# Patient Record
Sex: Male | Born: 1960 | Race: White | Hispanic: No | Marital: Married | Smoking: Current every day smoker
Health system: Southern US, Community
[De-identification: ages and names within clinical notes are randomized; demographics above are authoritative.]

## PROBLEM LIST (undated history)

## (undated) DIAGNOSIS — M4800 Spinal stenosis, site unspecified: Secondary | ICD-10-CM

## (undated) DIAGNOSIS — G629 Polyneuropathy, unspecified: Secondary | ICD-10-CM

## (undated) DIAGNOSIS — Z6841 Body Mass Index (BMI) 40.0 and over, adult: Secondary | ICD-10-CM

## (undated) DIAGNOSIS — E669 Obesity, unspecified: Secondary | ICD-10-CM

## (undated) DIAGNOSIS — J449 Chronic obstructive pulmonary disease, unspecified: Secondary | ICD-10-CM

## (undated) DIAGNOSIS — G4730 Sleep apnea, unspecified: Secondary | ICD-10-CM

## (undated) DIAGNOSIS — K859 Acute pancreatitis without necrosis or infection, unspecified: Secondary | ICD-10-CM

## (undated) DIAGNOSIS — E1169 Type 2 diabetes mellitus with other specified complication: Secondary | ICD-10-CM

## (undated) DIAGNOSIS — E1129 Type 2 diabetes mellitus with other diabetic kidney complication: Secondary | ICD-10-CM

## (undated) DIAGNOSIS — I10 Essential (primary) hypertension: Secondary | ICD-10-CM

## (undated) DIAGNOSIS — I219 Acute myocardial infarction, unspecified: Secondary | ICD-10-CM

## (undated) DIAGNOSIS — I509 Heart failure, unspecified: Secondary | ICD-10-CM

## (undated) DIAGNOSIS — E7800 Pure hypercholesterolemia, unspecified: Secondary | ICD-10-CM

## (undated) DIAGNOSIS — J189 Pneumonia, unspecified organism: Secondary | ICD-10-CM

## (undated) HISTORY — PX: AMPUTATION: SHX166

## (undated) HISTORY — DX: Heart failure, unspecified: I50.9

## (undated) HISTORY — PX: HERNIA REPAIR: SHX51

## (undated) HISTORY — DX: Spinal stenosis, site unspecified: M48.00

---

## 1996-02-23 HISTORY — PX: CHOLECYSTECTOMY: SHX55

## 2004-07-02 ENCOUNTER — Emergency Department: Payer: Self-pay | Admitting: Emergency Medicine

## 2005-12-30 ENCOUNTER — Emergency Department: Payer: Self-pay | Admitting: Emergency Medicine

## 2006-10-31 ENCOUNTER — Emergency Department: Payer: Self-pay | Admitting: Emergency Medicine

## 2007-02-27 ENCOUNTER — Emergency Department: Payer: Self-pay | Admitting: Emergency Medicine

## 2007-08-18 ENCOUNTER — Emergency Department: Payer: Self-pay | Admitting: Emergency Medicine

## 2012-08-23 ENCOUNTER — Inpatient Hospital Stay: Payer: Self-pay | Admitting: Internal Medicine

## 2012-08-23 LAB — CBC
HGB: 12.8 g/dL — ABNORMAL LOW (ref 13.0–18.0)
MCV: 89 fL (ref 80–100)
Platelet: 321 10*3/uL (ref 150–440)
RBC: 4.21 10*6/uL — ABNORMAL LOW (ref 4.40–5.90)
RDW: 13.5 % (ref 11.5–14.5)

## 2012-08-23 LAB — BASIC METABOLIC PANEL
Anion Gap: 9 (ref 7–16)
Co2: 26 mmol/L (ref 21–32)
Creatinine: 1.15 mg/dL (ref 0.60–1.30)
EGFR (African American): >60
Glucose: 176 mg/dL — ABNORMAL HIGH (ref 65–99)
Osmolality: 280 (ref 275–301)
Potassium: 4 mmol/L (ref 3.5–5.1)
Sodium: 137 mmol/L (ref 136–145)

## 2012-08-24 DIAGNOSIS — Z01810 Encounter for preprocedural cardiovascular examination: Secondary | ICD-10-CM

## 2012-08-24 LAB — URINALYSIS, COMPLETE
Bacteria: NONE SEEN
Bilirubin,UR: NEGATIVE
Ketone: NEGATIVE
Leukocyte Esterase: NEGATIVE
Ph: 5 (ref 4.5–8.0)
RBC,UR: 1 /HPF (ref 0–5)
Specific Gravity: 1.02 (ref 1.003–1.030)
Squamous Epithelial: <1

## 2012-08-24 LAB — BASIC METABOLIC PANEL
Anion Gap: 6 — ABNORMAL LOW (ref 7–16)
Co2: 27 mmol/L (ref 21–32)
EGFR (African American): >60
EGFR (Non-African Amer.): >60
Osmolality: 282 (ref 275–301)
Potassium: 4 mmol/L (ref 3.5–5.1)

## 2012-08-24 LAB — CBC WITH DIFFERENTIAL/PLATELET
Basophil #: 0.1 10*3/uL (ref 0.0–0.1)
Basophil %: 0.9 %
HCT: 35.4 % — ABNORMAL LOW (ref 40.0–52.0)
HGB: 12.3 g/dL — ABNORMAL LOW (ref 13.0–18.0)
Lymphocyte #: 3.1 10*3/uL (ref 1.0–3.6)
MCHC: 34.6 g/dL (ref 32.0–36.0)
Monocyte %: 7.5 %
Neutrophil #: 8.6 10*3/uL — ABNORMAL HIGH (ref 1.4–6.5)
Neutrophil %: 65.1 %
Platelet: 270 10*3/uL (ref 150–440)
RBC: 3.97 10*6/uL — ABNORMAL LOW (ref 4.40–5.90)
RDW: 13.4 % (ref 11.5–14.5)

## 2012-08-24 LAB — LIPID PANEL
HDL Cholesterol: 33 mg/dL — ABNORMAL LOW (ref 40–60)
Ldl Cholesterol, Calc: 49 mg/dL (ref 0–100)
Triglycerides: 251 mg/dL — ABNORMAL HIGH (ref 0–200)
VLDL Cholesterol, Calc: 50 mg/dL — ABNORMAL HIGH (ref 5–40)

## 2012-08-24 LAB — HEMOGLOBIN A1C: Hemoglobin A1C: 8.8 % — ABNORMAL HIGH (ref 4.2–6.3)

## 2012-10-27 ENCOUNTER — Encounter: Payer: Self-pay | Admitting: Surgery

## 2012-10-31 LAB — WOUND AEROBIC CULTURE

## 2012-11-03 ENCOUNTER — Inpatient Hospital Stay: Payer: Self-pay | Admitting: Internal Medicine

## 2012-11-03 LAB — CBC WITH DIFFERENTIAL/PLATELET
Basophil #: 0.2 10*3/uL — ABNORMAL HIGH (ref 0.0–0.1)
Eosinophil #: 0.1 10*3/uL (ref 0.0–0.7)
Eosinophil %: 0.3 %
HCT: 33.2 % — ABNORMAL LOW (ref 40.0–52.0)
Lymphocyte #: 1.9 10*3/uL (ref 1.0–3.6)
Lymphocyte %: 9.7 %
MCH: 29.4 pg (ref 26.0–34.0)
MCV: 86 fL (ref 80–100)
Monocyte #: 1.9 x10 3/mm — ABNORMAL HIGH (ref 0.2–1.0)
Monocyte %: 9.6 %
Platelet: 302 10*3/uL (ref 150–440)
RBC: 3.89 10*6/uL — ABNORMAL LOW (ref 4.40–5.90)
RDW: 13.6 % (ref 11.5–14.5)

## 2012-11-03 LAB — COMPREHENSIVE METABOLIC PANEL
Alkaline Phosphatase: 115 U/L (ref 50–136)
Calcium, Total: 9.2 mg/dL (ref 8.5–10.1)
Chloride: 98 mmol/L (ref 98–107)
Co2: 25 mmol/L (ref 21–32)
Creatinine: 1.2 mg/dL (ref 0.60–1.30)
EGFR (African American): >60
EGFR (Non-African Amer.): >60
Glucose: 180 mg/dL — ABNORMAL HIGH (ref 65–99)
Osmolality: 271 (ref 275–301)
Potassium: 4.1 mmol/L (ref 3.5–5.1)
Sodium: 130 mmol/L — ABNORMAL LOW (ref 136–145)
Total Protein: 8.1 g/dL (ref 6.4–8.2)

## 2012-11-05 LAB — BASIC METABOLIC PANEL
BUN: 21 mg/dL — ABNORMAL HIGH (ref 7–18)
Calcium, Total: 8.3 mg/dL — ABNORMAL LOW (ref 8.5–10.1)
Chloride: 105 mmol/L (ref 98–107)
EGFR (African American): >60
EGFR (Non-African Amer.): >60
Osmolality: 277 (ref 275–301)
Potassium: 3.7 mmol/L (ref 3.5–5.1)

## 2012-11-05 LAB — CBC WITH DIFFERENTIAL/PLATELET
Basophil #: 0.1 10*3/uL (ref 0.0–0.1)
Eosinophil #: 0.2 10*3/uL (ref 0.0–0.7)
Eosinophil %: 1.3 %
HCT: 31.3 % — ABNORMAL LOW (ref 40.0–52.0)
HGB: 10.7 g/dL — ABNORMAL LOW (ref 13.0–18.0)
Lymphocyte %: 15.1 %
MCH: 29.4 pg (ref 26.0–34.0)
MCHC: 34.2 g/dL (ref 32.0–36.0)
MCV: 86 fL (ref 80–100)
Monocyte %: 8.6 %
Neutrophil %: 74.3 %
RDW: 13.5 % (ref 11.5–14.5)
WBC: 14.3 10*3/uL — ABNORMAL HIGH (ref 3.8–10.6)

## 2012-11-05 LAB — VANCOMYCIN, TROUGH: Vancomycin, Trough: 12 ug/mL (ref 10–20)

## 2012-11-06 LAB — CBC WITH DIFFERENTIAL/PLATELET
Eosinophil #: 0.4 10*3/uL (ref 0.0–0.7)
Eosinophil %: 2.1 %
HGB: 11.4 g/dL — ABNORMAL LOW (ref 13.0–18.0)
Lymphocyte %: 16.5 %
MCH: 29.7 pg (ref 26.0–34.0)
MCHC: 34.6 g/dL (ref 32.0–36.0)
MCV: 86 fL (ref 80–100)
Monocyte #: 1 x10 3/mm (ref 0.2–1.0)
Neutrophil #: 12.7 10*3/uL — ABNORMAL HIGH (ref 1.4–6.5)
Platelet: 310 10*3/uL (ref 150–440)
RDW: 13.6 % (ref 11.5–14.5)

## 2012-11-07 LAB — CBC WITH DIFFERENTIAL/PLATELET
Basophil #: 0.1 x10 3/mm 3
Basophil %: 0.8 %
Eosinophil #: 0.4 x10 3/mm 3
Eosinophil %: 2.7 %
HCT: 32.7 % — ABNORMAL LOW
HGB: 11.3 g/dL — ABNORMAL LOW
Lymphocyte %: 19.4 %
Lymphs Abs: 2.8 x10 3/mm 3
MCH: 29.5 pg
MCHC: 34.4 g/dL
MCV: 86 fL
Monocyte #: 1 "x10 3/mm "
Monocyte %: 7.1 %
Neutrophil #: 10 x10 3/mm 3 — ABNORMAL HIGH
Neutrophil %: 70 %
Platelet: 298 x10 3/mm 3
RBC: 3.82 x10 6/mm 3 — ABNORMAL LOW
RDW: 13.8 %
WBC: 14.3 x10 3/mm 3 — ABNORMAL HIGH

## 2012-11-08 LAB — CBC WITH DIFFERENTIAL/PLATELET
Basophil %: 0.7 %
Eosinophil #: 0.6 10*3/uL (ref 0.0–0.7)
Eosinophil %: 3.4 %
HCT: 34.2 % — ABNORMAL LOW (ref 40.0–52.0)
HGB: 11.6 g/dL — ABNORMAL LOW (ref 13.0–18.0)
Lymphocyte #: 3.7 10*3/uL — ABNORMAL HIGH (ref 1.0–3.6)
Lymphocyte %: 22.6 %
MCH: 29.3 pg (ref 26.0–34.0)
MCHC: 34 g/dL (ref 32.0–36.0)
Monocyte #: 1 x10 3/mm (ref 0.2–1.0)
Monocyte %: 6.3 %
Neutrophil %: 67 %
WBC: 16.3 10*3/uL — ABNORMAL HIGH (ref 3.8–10.6)

## 2012-11-08 LAB — CULTURE, BLOOD (SINGLE)

## 2012-11-09 LAB — WOUND CULTURE

## 2012-11-22 ENCOUNTER — Encounter: Payer: Self-pay | Admitting: Surgery

## 2012-12-11 ENCOUNTER — Emergency Department: Payer: Self-pay | Admitting: Emergency Medicine

## 2013-01-17 DIAGNOSIS — E785 Hyperlipidemia, unspecified: Secondary | ICD-10-CM | POA: Insufficient documentation

## 2013-01-17 DIAGNOSIS — F17200 Nicotine dependence, unspecified, uncomplicated: Secondary | ICD-10-CM | POA: Diagnosis present

## 2013-01-17 DIAGNOSIS — E119 Type 2 diabetes mellitus without complications: Secondary | ICD-10-CM

## 2013-01-17 DIAGNOSIS — S98139A Complete traumatic amputation of one unspecified lesser toe, initial encounter: Secondary | ICD-10-CM | POA: Insufficient documentation

## 2013-01-17 DIAGNOSIS — I10 Essential (primary) hypertension: Secondary | ICD-10-CM | POA: Diagnosis present

## 2013-01-17 DIAGNOSIS — F329 Major depressive disorder, single episode, unspecified: Secondary | ICD-10-CM | POA: Insufficient documentation

## 2014-04-11 ENCOUNTER — Inpatient Hospital Stay: Payer: Self-pay | Admitting: Internal Medicine

## 2014-04-22 ENCOUNTER — Other Ambulatory Visit: Payer: Self-pay | Admitting: Urgent Care

## 2014-04-23 ENCOUNTER — Emergency Department: Payer: Self-pay | Admitting: Internal Medicine

## 2014-06-07 ENCOUNTER — Ambulatory Visit
Admit: 2014-06-07 | Disposition: A | Discharge status: Home / Self Care | Payer: Self-pay | Attending: Gastroenterology | Admitting: Gastroenterology

## 2014-06-14 NOTE — H&P (Signed)
PATIENT NAME:  Brian [name], Brian [name] MR#:  [id number] DATE OF BIRTH:  Apr 30, 1960  DATE OF ADMISSION:  08/23/2012  REFERRING PHYSICIAN: Lavonia Drafts, MD  PRIMARY CARE PHYSICIAN: Nonlocal.  The patient goes to [facility] for his care.   CHIEF COMPLAINT: Ulcer of the left great toe.   HISTORY OF PRESENT ILLNESS: [name] is a very nice 54 year old gentleman who has history of diabetes which has been well controlled. The highest that his hemoglobin A1c has been is around 9.  He was told that he was be going to be put on insulin, and he started doing some diet to get it down to 6, but now it increased again almost close to 8.0. His blood sugars have been running in the 170s to 200s. The patient has a history of severe neuropathy, and he states that he likes to wear comfortable shoes without socks. The patient apparently has been educated about diabetes neuropathy, but he still does things like that.  He started having a little blister a week ago that became bigger and bigger and bigger up until today.  He started getting significant inflammation with signs of erosion and is starting to have a bad odor. Now, he developed significant erythema at the level of the toe going down to mid foot. The patient had an elevation of white blood cells. The patient states that he has not had any fever but occasionally has been feeling a little bit cold. He does not feel very energetic and decided to come here to the Emergency Department to get evaluated. At the moment of the evaluation, the ulcer looks a little bit infected. He had a cellulitic process as well of the great toe for which he is admitted for treatment of diabetic foot ulcer. His x-ray of the foot has not been read  by radiologist, but I can see some beginning subperiosteal reaction and maybe some bone destruction. We are going to wait for confirmation by the radiologist.   REVIEW OF SYSTEMS: Negative except for as mentioned in history of present illness.   CONSTITUTIONAL: Denies any fever, denies any significant weakness, weight loss or weight gain. No blurry vision, double vision, no cataracts. Extremities: No tinnitus, ear pain or sinus pain.  RESPIRATORY: No cough, no respiratory distress. No COPD.  CARDIOVASCULAR: No chest pain, orthopnea, edema or palpitations.  GASTROINTESTINAL: No nausea, vomiting, abdominal pain, or hematemesis.  GENITOURINARY: No dysuria, hematuria or changes in frequency.  ENDOCRINE: No polyuria, polydipsia, polyphagia, cold or heat intolerance. His diabetes is uncontrolled.   HEMATOLOGIC AND LYMPHATIC: No anemia, easy bruising, bleeding or swollen glands.  SKIN: No rashes, petechiae or lesions.  MUSCULOSKELETAL: No significant pain on the back, shoulder. No gout. No significant neurologic changes other than the neuropathy. Decreased sensation at the level of the distal lower extremities. No tremor, vertigo, CVAs or TIAs.  PSYCHIATRIC: No significant insomnia, depression, anxiety.   PAST MEDICAL HISTORY: 1.  Peripheral neuropathy.  2.  Type 2 diabetes, non-insulin-dependent, uncontrolled.  3.  Chronic back pain.  4.  Hypertension.  5.  Depression.   ALLERGIES: THE PATIENT STATES HE IS ALLERGIC TO NAPROXEN.   PAST SURGICAL HISTORY: Cholecystectomy.   FAMILY HISTORY: Coronary artery disease. His dad died at the age of 53 from a massive heart attack. The patient denies any cancer in the history, positive diabetes and hypertension in his father.   SOCIAL HISTORY: The patient smokes 1 pack of cigarettes a day.  Smoking cessation counseling was  given the patient  for about 4 minutes. The patient states that he has never consider quitting smoking. I explained the situation of peripheral vascular disease, coronary artery disease, amputations, lung cancer and other cancers associated to tobacco.  The patient says that he will consider quitting smoking. Denies any alcohol or drugs. He is married. He has been off work since  2007 after he got laid off and he applied to Disability.   MEDICATIONS: Zoloft 100 mg once a day, Tramadol 50 mg take 2 twice daily, metformin 1000 mg twice daily, gabapentin 300 mg take 2 capsules once a day, cetirizine 10 mg once a day, Accupril 5 mg once a day.   PHYSICAL EXAMINATION: Blood pressure 148/79, pulse 86, up to 113, temperature 97.7, respirations 20, oxygen saturation 97% on room air.  GENERAL: The patient is alert, oriented x 3. No acute distress. No respiratory distress. Hemodynamically stable.  HEENT: Pupils are equal and reactive. Extraocular movements are intact. Mucosa are moist. Anicteric sclerae. Pink conjunctivae. No oral lesions. No oropharyngeal exudates.  NECK: Obese. No rigidity. No masses. Trachea central. No carotid bruits.  CARDIOVASCULAR: Regular rate and rhythm. No murmurs, rubs or gallops are appreciated. No displacement of PMI. No tenderness to palpation of anterior chest wall.  LUNGS: Clear without any wheezing or crepitus. No use of accessory muscles.  ABDOMEN: Soft, nontender, nondistended. No hepatosplenomegaly. No masses. Bowel sounds are positive.  GENITAL: Deferred.  EXTREMITIES: Mild edema +1 at the level of the ankles. No cyanosis, no clubbing. Positive ulcer over the left toe. Lower pole distal is wet.  It has a bad odor, has erythema surrounding the ulcer, and there is no crepitation. The patient has no sensation on palpation of the ulcer.  The ulcer has eschar on top, and his great toe is red,  erythematous. Erythema progressed around the foot all the way to mid foot on the dorsum area.  VASCULAR: Pulses +2. Capillary refill less than 3 seconds.  NEUROLOGIC: Significant decreased sensation at the level of the foot bilaterally. Cranial nerves II through XII are intact. Strength is 5 out of 5 in all 4 extremities. Deep tendon reflexes +2. The patient's affect is normal.  Alert and oriented x 3.  LYMPHATIC: Negative for lymphadenopathy in the neck,  supraclavicular areas or groin.  SKIN: Without any rashes or petechiae other than the one described at the level of the foot.   LABORATORY AND RADIOLOGICAL DATA:  Glucose 176, BUN 19, creatinine 1.15, sodium 137, potassium 4.0. White blood cells 15.9, hemoglobin 12, platelets 321. Urinalysis is pending, ordered just now.  EKG is pending, ordered just now.  X-ray of the foot as described above in the HPI.   ASSESSMENT AND PLAN: A 54 year old gentleman with history of uncontrolled diabetes, neuropathy, back pain, hypertension and depression, comes today with an ulcer that has been going on for over a week.   1.  Diabetic foot ulcer:  The patient has an infected ulcer with a cellulitic process down to mid foot. There is no gas production. There is no crepitation. There are no signs of abscess. The patient has a hemoglobin A1c in the past around eight, uncontrolled diabetes, severe neuropathy.  For this, we are going to admit the patient and treat him.  Podiatry consult has been placed. The patient is started on broad-spectrum antibiotics for diabetic foot ulcer to cover things like pseudomonas with Levaquin 750 mg IV daily. Consider adding on Zosyn if there is significant concern for an aerobic infection. At this moment, I  do not think there is. Diabetes education given.  Neuropathy education given.  2.  Increased white blood cells:  Likely due to the infectious process. Follow-up labs.  3.  Hypertension:  The patient has well controlled hypertension on lisinopril.  Continue same management.  4.  Type 2 diabetes, uncontrolled, non-insulin-dependent for now:  The patient has been offered to start on insulin in the past. He has refused.  He started with a hemoglobin A1c above 9, dropped it down 6 with only diet. The patient actually got his hemoglobin A1c several weeks ago back to 8. At this moment, we are going to continue metformin and add on insulin sliding scale.  Diet recommendations given.  5.   Tobacco abuse:  The patient has a history of 1 pack a day. Smoking cessation education has been given over 4 minutes.  6.  The patient is at high risk of peripheral vascular disease. His pulses are okay, but consider vascular consultation if ulcer not healing after therapy. Due to possible infection of the bone with possible periosteal reaction, we are  going to add an ESR and a CRP.  If doubts, consider adding an MRI, but hopefully this could be determined once the ulcer is debrided.  7.  Deep vein thrombosis prophylaxis with heparin.  Gastrointestinal prophylaxis with Protonix.   TIME SPENT: I spent about 45 minutes with this patient.    ____________________________ Bouse Sink, MD rsg:cb D: 08/23/2012 21:47:55 ET T: 08/23/2012 22:46:48 ET JOB#: [id number]  cc: [place] Sink, MD, <Dictator> Janeshia Ciliberto America Brown MD ELECTRONICALLY SIGNED 09/02/2012 16:36

## 2014-06-14 NOTE — Discharge Summary (Signed)
PATIENT NAME:  Brian [name], BRAMBLETT MR#:  [id number] DATE OF BIRTH:  August 05, 1960  DATE OF ADMISSION:  11/03/2012 DATE OF DISCHARGE:  11/08/2012  PRIMARY CARE PHYSICIAN:  None local.   CONSULTATIONS:  1.  Podiatry, Dr. Cleda Mccreedy.  2.  ID physician,  Dr. Ola Spurr.    DISCHARGE DIAGNOSES: 1. Systemic inflammatory response syndrome to her left great toe diabetes foot ulcer and osteomyelitis.   2.  Dehydration.  3.  Hypertension.  4.  Diabetes.  5.  Severe peripheral neuropathy.   PROCEDURE:  Left great toe amputation.   CONDITION: Stable.   CODE STATUS: Full code.   HOME MEDICATIONS:  1.  Metformin 1000 mg p.o. b.i.d. 2.   Zocor 20 mg p.o. 2 tablets once a day.  3.  Sertraline 100 mg 1 tablet once a day.  4.  Lisinopril 5 mg p.o. once a day.  5.  Gabapentin 300 mg p.o. 2 tabs.  6.  Percocet 325 mg/5 mg p.o. tablets q. 6 hours p.r.n. for five days.  7.  Augmentin 875 mg b.i.d. for 2 to 4 weeks, now we gave 14 days.  8.  Cipro 500 mg p.o. 1 tab q. 12 hours for 14 days.   HOME HEALTH:  The patient then needs home health with wound care for the left foot.   DIET: Low sodium, low fat, low cholesterol, ADA diet.   ACTIVITY: As tolerated.   FOLLOW-UP CARE: Follow with PCP within 1 to 2 weeks. Follow up with Dr. Cleda Mccreedy in podiatry one week and follow up with Dr. Ola Spurr within 2 to 4 weeks.   REASON FOR ADMISSION: And nonhealing left great toe ulcer and fever.   HOSPITAL COURSE: The patient is a 54 year old Caucasian male with a history of nonhealing left great toe ulcer, peripheral neuropathy, diabetes, hypertension, who presented ED with nonhealing left great toe ulcer and fever.  In the ED, the patient had a fever of 102.4,  tachycardia at 119 with WBC of 18,000. He was admitted for SIRS secondary to a nonhealing left great toe ulcer. He received vancomycin and cefazolin in ED. For detailed history and physical examination, please refer to the admission note dictated by Dr. [name].  Laboratory data on admission date showed glucose 180, BUN 29, creatinine 1.2, sodium 130, potassium 4.1, chloride 98, bicarbonate 25. The patient had WBC at 19.8, platelets 392. Wound culture showed a heavy growth of staph aureus, MSSA on September 5.    1.  For SIRS secondary to left great toe diabetic foot ulcer, after admission Dr. Cleda Mccreedy evaluated the patient and got an x-ray which showed osteomyelitis. Dr. Cleda Mccreedy suggested amputation of left great toe. The patient underwent this procedure. After the procedure, the patient has been under wound care. In addition after admission, the patient has been treated with vancomycin and cefazolin since the wound culture showed MSSA sensitive to penicillin. Vancomycin was discontinued. Dr. Ola Spurr did a consult and suggested the patient needed a long-term antibiotics; Augmentin a 875 mg for 2 to 4 weeks. In addition, he suggested to Cipro 500 mg p.o. b.i.d.  2.  For diabetes, the patient has been treated with metformin sliding-scale and Lantus. Blood sugar has been controlled.  3.  Hypertension has been treated with lisinopril.  4.  For severe peripheral neuropathy. The patient has been treated with gabapentin.  5.  The patient also has dehydration and hyponatremia, which was treated with normal saline IV and has improved. The patient still has leukocytosis, up and down.  The patient's WBC today is 16.3. I discussed the patient's condition and plan of treatment with Dr. Ola Spurr, case manager and nurse. The patient will be discharged to home today with home health care and wound care.   TIME SPENT: About 137 minutes   ____________________________ Demetrios Loll, MD qc:cc D: 11/08/2012 15:03:33 ET T: 11/08/2012 20:11:12 ET JOB#: [id number]  cc: Demetrios Loll, MD, <Dictator> Demetrios Loll MD ELECTRONICALLY SIGNED 11/09/2012 15:29

## 2014-06-14 NOTE — Consult Note (Signed)
PATIENT NAME:  Brian [name], MARSH MR#:  [id number] DATE OF BIRTH:  10/17/1960  DATE OF CONSULTATION:  11/05/2012  REFERRING PHYSICIAN:  Dr. Bridgett Larsson. CONSULTING PHYSICIAN:  Cheral Marker. Ola Spurr, MD  PRIMARY CARE DOCTOR:  Family nurse practitioner at [facility] in [place], [place].   PODIATRY CONSULTANT: Dr. Vickki Muff.   HISTORY OF PRESENT ILLNESS: This is a 54 year old gentleman with peripheral neuropathy due to type 2 diabetes as well as hypertension and depression. He was admitted September 12th to the hospitalist service after being told at the [facility] that he would need amputation of his toe. Apparently at that time the patient had a fever of 102.   The patient reports that he was admitted in July with a nonhealing ulcer on his left great toe. He was treated during that admission with some local bedside  debridement as well as antibiotics. He is unsure of what antibiotics he has been on in the past but recently he has been on levofloxacin which he says was started about 1-1/2 weeks ago at the [facility]. He was also dced on levo in July.  He had progression of the toe and it became partially gangrenous. At the time of admission, he was tachycardic, febrile, had a white count to 18,000. He was treated initially with vancomycin and cefazolin.   On July 13th, the patient underwent partial first ray amputation of his left foot. Cultures are pending. We are asked to consult for further antibiotic management.   PAST MEDICAL HISTORY 1.  Peripheral neuropathy due to type 2 diabetes.  2.  Type 2 diabetes, unknown control.  3.  Chronic back pain.  4.  Hypertension.  5.  Depression.   PAST SURGICAL HISTORY: Toe amputation as above, cholecystectomy.    ALLERGIES: THE PATIENT IS ALLERGIC TO NAPROSYN.   MEDICATIONS: Apparently, the patient was on levofloxacin 500 mg as an outpatient. Currently, he is on vancomycin and cefzolin Other medications include atorvastatin, gabapentin,  lisinopril, metformin, tramadol and Zoloft.   FAMILY HISTORY: Positive for coronary artery disease. His father died at 61 from a massive heart attack.   SOCIAL HISTORY: The patient continues to smoke 1 pack per day. He does not drink alcohol. He is married and they have 1 grown child. He has been unemployed.   REVIEW OF SYSTEMS: Eleven systems reviewed and negative except as per HPI.    PHYSICAL EXAMINATION VITAL SIGNS: T-max on admission was 102.3, currently he is afebrile. Blood pressure is 124/66, pulse 98, pulse ox 94% on room air.  GENERAL: He is obese, laying in bed in no acute distress.  HEENT: Pupils equal, round, reactive to light and accommodation. Extraocular movements are intact. Sclerae are anicteric. Oropharynx clear.  NECK: Supple.  HEART: Regular.  LUNGS: Clear.  ABDOMEN: Soft, nontender, nondistended. No hepatosplenomegaly.  EXTREMITIES: Left foot is wrapped in postoperative bandages. There is no spreading cellulitis above it.    DIAGNOSTIC DATA: Blood cultures x 2 are negative from admission. Wound culture is pending. White blood count on admission was 19.8 (now down to 14)  platelets 302, hemoglobin 11.4. Renal function was normal at 1.2. X-ray of the foot showed probable findings of osteomyelitis from the left great toe.  Prior cx from July grew MSSA, Strep viridans and a GPR  IMPRESSION AND PLAN: This is a 54 year old with diabetes, a nonhealing diabetic foot ulcer in his great toe. He is now status post amputation.  Cultures are pending.   RECOMMENDATIONS 1.  At this time  I would continue current antibiotics. May need to cover pseudomonas as well but since is clincally responding to current coverage it amy be adequate. 2.  He would likely benefit from oral course of antibiotics following this amputation depending on how the wound edges look after debridement.  3.  I will follow with you and help guide treatment based on cultures.   Thank you for the consult. I would  be glad to follow with you.   ____________________________ Cheral Marker. Ola Spurr, MD dpf:cs D: 11/05/2012 19:02:14 ET T: 11/05/2012 19:46:06 ET JOB#: [id number]  cc: Cheral Marker. Ola Spurr, MD, <Dictator> Jossue Rubenstein Ola Spurr MD ELECTRONICALLY SIGNED 11/05/2012 21:50

## 2014-06-14 NOTE — Consult Note (Signed)
full note dictated.with diabetic ulcer left great toe.erythema.with no purulence or drainage..ulcer.not probe.negative for osteomyleitis.daily dressings.monitor outpt.keep pressure off of foot.recommend wedge shoe to off-load foot.with me in outpt clinic in 1 week.   Electronic Signatures: Samara Deist (MD)  (Signed on 03-Jul-14 13:41)  Authored  Last Updated: 03-Jul-14 13:41 by Samara Deist (MD)

## 2014-06-14 NOTE — Consult Note (Signed)
PATIENT NAME:  Brian [name], Brian [name] MR#:  [id number] DATE OF BIRTH:  [birth year]  DATE OF CONSULTATION:  11/04/2012  CONSULTING PHYSICIAN:  Sharlotte Alamo, DPM  REASON FOR CONSULTATION: [name] is a 54 year old diabetic male with a history of ulceration on his left great toe. He was previously admitted back in July and seen by Dr. Vickki Muff who performed a bedside debridement. He was followed outpatient but then decided to be followed over at the [facility] by Dr. Thom Chimes. Recently he started having increased redness and swelling in the left foot as well as some fevers and presented back to the Emergency Department. The patient was admitted with an elevated white count of almost 20,000 and consulted for amputation of the great toe.   PAST MEDICAL HISTORY: 1.  Non-insulin-requiring diabetes with associated peripheral neuropathy.  2.  Chronic back pain.  3.  Hypertension.  4.  Depression.   PAST SURGICAL HISTORY: 1.  Cholecystectomy.  2.  Bedside debridement left great toe.   HOME MEDICATIONS: 1.  Levaquin 500 mg daily.  2.  Atorvastatin 40 mg daily.  3.  Gabapentin 600 mg 3 times daily.  4.  Lisinopril 5 mg daily.  5.  Metformin 1000 mg twice daily.  6.  Tramadol p.r.n. pain.  7.  Zoloft 100 mg daily.   ALLERGIES: NAPROXEN.   FAMILY HISTORY: Significant for heart disease. Dad died from a heart attack at 38.   SOCIAL HISTORY: He is a 1 pack a day smoker. Denies alcohol. He is married. Currently unemployed.   REVIEW OF SYSTEMS: Recently has had some fever with fatigue and weakness. Has had significant swelling and redness in the right foot and great toe with some discoloration starting on the toe. He states he has significant numbness in both lower extremities from his diabetes. Some chronic back pain. Denies any nausea or vomiting. No cough, shortness of breath or chest pain.   PHYSICAL EXAMINATION: VASCULAR: DP and PT pulses are palpable but diminished 1/4 bilateral. Capillary filling time  is intact, although not to the left great toe which is starting to turn gangrenous.  NEUROLOGICAL: Completely neuropathic in the toes and feet bilateral.  INTEGUMENT: Skin is warm, dry, and supple. There is some significant edema and erythema in the left great toe and forefoot with some cyanotic changes starting distally in the great toe. Eschar medial, as well as a plantar on the hallux with an open ulceration, full thickness which probes directly down to bone on the hallux.  MUSCULOSKELETAL: Guarded range of motion in the left foot. Muscle testing is deferred.   X-RAYS: Multiple views of the left foot reveal a large soft tissue defect on the plantar medial aspect of the left great toe. There is cortical erosion at the plantar aspect of the distal phalanx and possibly some early changes at the medial head of the proximal phalanx. No clear gas in the tissues is noted. No evidence of fracture.   LABORATORY DATA: Admission white count 19.8 with a significant left shift and neutrophils. Blood cultures so far show no growth.   IMPRESSION: 1.  Diabetes with associated neuropathy.  2.  Full thickness ulceration left hallux with osteomyelitis.   PLAN: I discussed with the patient the need for amputation of the toe at this point. I discussed that the level that we will be able to amputate at will depend upon amount of bone needed to be removed as well as skin closure options. We discussed complications including nonhealing and continued infection  which may require additional debridements higher up into the foot. The patient will be n.p.o. now. Consent form for amputation of left great toe. We will plan for surgery later on today.    ____________________________ Sharlotte Alamo, DPM tc:dp D: 11/04/2012 09:53:51 ET T: 11/04/2012 10:33:41 ET JOB#: [id number]  cc: Sharlotte Alamo, DPM, <Dictator> Adaliz Dobis DPM ELECTRONICALLY SIGNED 12/04/2012 10:41

## 2014-06-14 NOTE — Discharge Summary (Signed)
PATIENT NAME:  Brian [name], Brian [name] MR#:  [id number] DATE OF BIRTH:  12-23-1960  DATE OF ADMISSION:  08/23/2012 DATE OF DISCHARGE:  08/24/2012  PRESENTING COMPLAINT: Ulcer of the left great toe.   DISCHARGE DIAGNOSES:  1. Diabetic left great toe ulcer.  2. Morbid obesity.  3. Uncontrolled type 2 diabetes.   CONDITION ON DISCHARGE: Fair.   DISCHARGE MEDICATIONS: 1. Levaquin 500 p.o. daily for 10 days.  2. Glipizide XL 10 mg daily.  3. Lipitor 40 mg daily.  4. Accupril 5 mg daily.  5. Cetirizine 10 mg daily.  6. Tramadol 50 mg 2 tablets b.i.d.  7. Metformin 1000 mg b.i.d.  8. Gabapentin 300 mg 2 capsules once a day in the morning and 4 at night.   DIET: Low sodium, carbohydrate controlled, ADA 1800-calorie diet.   FOLLOWUP:  1. Follow up with Dr. Vickki Muff, podiatry.  2. Dressing changes on left great toe per instructions.   CONSULTATIONS: Podiatry consultation with Dr. Vickki Muff.   BRIEF SUMMARY OF HOSPITAL COURSE: Brian [name] is a 54 year old morbidly obese Caucasian gentleman with history of uncontrolled diabetes, diabetic neuropathy, back pain, hypertension and depression, who comes with ulcer over the left great toe which has been going on for more than a week. He was admitted with:   1. Left great toe diabetic foot ulcer. X-ray of foot did not show any gas production, did not show any evidence of osteomyelitis. He was started on IV Levaquin. The patient has uncontrolled diabetes and severe neuropathy. Podiatry consultation was obtained with Dr. Vickki Muff, who did some bedside debridement and recommended followup as outpatient.  2. Leukocytosis, likely due to infectious process.  3. Hypertension, well controlled on lisinopril.  4. Type 2 diabetes, uncontrolled. Metformin was continued. Added sliding scale insulin, and at discharge, glipizide was added. Dietary recommendations were given.  5. Tobacco abuse. The patient was counseled on smoking cessation.   TIME SPENT: 40 minutes.    ____________________________ Hart Rochester Posey Pronto, MD sap:OSi D: 08/25/2012 07:00:17 ET T: 08/25/2012 09:20:06 ET JOB#: [id number]  cc: Kolbie Clarkston A. Posey Pronto, MD, <Dictator> Pete Glatter. Vickki Muff, DPM Ilda Basset MD ELECTRONICALLY SIGNED 09/11/2012 18:57

## 2014-06-14 NOTE — Op Note (Signed)
PATIENT NAME:  Brian [name], Brian [name] MR#:  [id number] DATE OF BIRTH:  [birth year]  DATE OF PROCEDURE:  11/04/2012  PREOPERATIVE DIAGNOSIS:  Osteomyelitis with early gangrenous changes, left great toe.  POSTOPERATIVE DIAGNOSIS:  Osteomyelitis with early gangrenous changes, left great toe.   PROCEDURE:  Partial first ray resection, left foot.   SURGEON:  Sharlotte Alamo, DPM.   ANESTHESIA:  Local MAC.   HEMOSTASIS:  Pneumatic tourniquet left ankle, 250 mmHg.   ESTIMATED BLOOD LOSS:  50 mL.   PATHOLOGY:  Left great toe and first metatarsal head.   CULTURES:  Bone cultures, left great toe distal phalanx.   DRAINS:  4 x 4 sterile saline-soaked gauze.   COMPLICATIONS:  None apparent.   OPERATIVE INDICATIONS:  This is a 54 year old male with a two month history of an ulceration on his left great toe which went down to the level of bone with obvious osteomyelitis and early gangrenous changes.  He was admitted to the hospital for IV antibiotics and definitive debridement.   OPERATIVE PROCEDURE:  The patient was taken to the Operating Room and placed on the table in the supine position.  Following satisfactory sedation, the left foot was anesthetized with 14 mL of 0.5% Sensorcaine plain around the first metatarsal.  A pneumatic tourniquet was applied at the level of the left ankle and the foot was prepped and draped in the usual sterile fashion.   Attention was then directed to the distal aspect of the right foot where an elliptical incision was made coursing dorsal to plantar around the base of the great toe.  The incision was carried sharply down to the level of the bone and during the incision, there was noted to be a significant amount of purulent discharge from the wound edges.  Dissection was carried back to the level of the metatarsophalangeal joint where the toe was disarticulated and removed in toto.  Next, using a VersaJet debrider, the necrotic and devitalized tissue was debrided around the edges  of the wound and there appeared to be a good bleeding base.  There was noted to be some tight skin closure on the wound edges on reapproximation and decision was made to excise the first metatarsal head.  Prior to bone work the foot was exsanguinated and the tourniquet inflated to 250 mmHg.  Next, attention was then directed to the first metatarsal head where the head was excised using a pneumatic saw and removed along with the sesamoid apparatus.  The wound was again debrided using the VersaJet debrider excisionally on a setting of 7 through all of the superficial, deep and subcutaneous tissues.  The wound was then flushed with copious amounts of sterile saline and closed using 4-0 nylon vertical mattress and simple interrupted sutures.  A 1 cm section was left open at the distal aspect and this was packed using sterile saline-soaked gauze.  4 x 4's, fluffs, ABDs and Kerlix were applied followed by an Ace wrap.  The tourniquet was released.  The patient tolerated the procedure and anesthesia well and was transported to the PACU with vital signs stable and in good condition.    ____________________________ Sharlotte Alamo, DPM tc:ea D: 11/04/2012 17:47:40 ET T: 11/05/2012 01:21:25 ET JOB#: [id number]  cc: Sharlotte Alamo, DPM, <Dictator> Shaquandra Galano DPM ELECTRONICALLY SIGNED 12/04/2012 10:41

## 2014-06-14 NOTE — Consult Note (Signed)
PATIENT NAME:  Brian [name], Brian [name] MR#:  [id number] DATE OF BIRTH:  1960-03-06  DATE OF CONSULTATION:  08/24/2012  REFERRING PHYSICIAN:   CONSULTING PHYSICIAN:  Curlie Sittner A. Vickki Muff, DPM  REASON FOR CONSULTATION:  Left foot diabetic ulcer.  HISTORY OF PRESENT ILLNESS:  This is a 54 year old gentleman. He has been seen by his primary care physician with complaints of a diabetic ulcer on his left foot. He apparently had some labs performed recently that showed concern for infection, was instructed to follow up at the Emergency Department for possible IV antibiotics. He denies any fevers or chills, states he has been diabetic since 2009.   PAST MEDICAL HISTORY:  Peripheral neuropathy, type 2 diabetes, chronic back pain, hypertension, depression.  SOCIAL HISTORY:  He lives at home. He is currently unemployed.   FAMILY HISTORY:  Coronary artery disease and diabetes.  HOME MEDICATIONS:  Zoloft, Tramadol, metformin, gabapentin, cetirizine, Accupril.  ALLERGIES:  NAPROXEN.   REVIEW OF SYSTEMS:  Per HPI, no fevers or chills. No shortness of breath or chest pains. Numbness to the lower extremities. Further review of systems as above. GENERAL:  He is alert and oriented.  VASCULAR:  He has barely palpable DP and PT pulses. Capillary refill time is brisk. The skin is warm to touch. NEUROLOGIC:  He is completely neuropathic to the lower extremities.  DERMATOLOGIC:  He has a superficial ulceration to the plantar aspect of the left foot with a central area of devitalized tissue. The ulcer measures approximately 2 cm in diameter. He also had some hyperkeratosis.  There is no drainage, no purulence. He has a minimal to mild amount of surrounding erythema to this wound. There is no lymphangetic streaking. MUSCULOSKELETAL:  He has some mild decreased range of motion to the first MTPJ but no pain with pressure or range of motion of the mid foot. There is no marked instability.   LABORATORY, DIAGNOSTIC, AND  RADIOLOGICAL DATA:  I reviewed the x-rays that did not show any signs of erosive changes. His white blood cell count has been elevated at 13,000.   ASSESSMENT: 1.  Diabetes with neuropathy. 2.  Diabetic foot ulcer.  3.  A recent history of cellulitis.  PLAN:  No cultures taken as there is nothing draining and no purulence with no infectious tissue to take a culture of at this time. I did perform an excisional debridement of this ulceration with a 15 blade down to good healthy, bleeding tissue. A 2 cm excision of debridement to the dermis and dermal layer was undertaken with a 15 blade. Complete excision of the devitalized tissue was noted. A bandage was applied. He will be dispensed an Ortho Wedge shoe, daily dressings to be performed.   RECOMMENDATION:  Discharge and to follow up with me in the outpatient clinic. He can go home on p.o. antibiotics at this point, in my opinion. I am not sure where this elevated white blood cell count is coming from other than his possible recent cellulitis that has likely calmed down with his current IV antibiotics.  ____________________________ Pete Glatter Vickki Muff, DPM jaf:jm D: 08/24/2012 13:47:00 ET T: 08/24/2012 14:33:46 ET JOB#: [id number]  cc: Larkin Ina A. Vickki Muff, DPM, <Dictator> Darlena Koval DPM ELECTRONICALLY SIGNED 09/18/2012 16:40

## 2014-06-14 NOTE — H&P (Signed)
PATIENT NAME:  Brian [name], Brian [name] MR#:  [id number] DATE OF BIRTH:  1960-10-30  DATE OF ADMISSION:  11/03/2012  PRIMARY CARE PHYSICIAN:  Hardin Negus family nurse practitioner at [facility] in [place], [place].   PODIATRY:  Dr. Vickki Muff.   CHIEF COMPLAINT:  Nonhealing left great toe ulcer and fever.    HISTORY OF PRESENT ILLNESS:  Brian [name] is a 54 year old Caucasian gentleman well-known to me from previous admission in July of 2014 when he came in with a nonhealing left great toe ulcer.  The patient at that time underwent debridement by Dr. Vickki Muff, followed up with him as outpatient and then followed up with Dr. Quay Burow at the wound center, got a round of Levaquin which he is still on it and went for evaluation, was found to have worsening ulcer with necrotic nonhealing progressive ulcer likely due to diabetes and peripheral neuropathy.  The patient is having fever of 102.4.  He is tachycardic at 119 with elevated white count of 18,000.  He is going to be admitted for SIRS secondary to nonhealing left great toe ulcer.  The patient received a dose of vancomycin and cefazolin.   PAST MEDICAL HISTORY: 1.  Nonhealing left great toe ulcer.  2.  Peripheral neuropathy.  3.  Type 2 diabetes.  4.  Chronic back pain.  5.  Hypertension.  6.  Depression.   ALLERGIES:  NAPROSYN.   MEDICATIONS: 1.  Levaquin 500 by mouth daily.  The patient has three more days to go for his nonhealing necrotic ulcer.  2.  Atorvastatin 40 mg daily.  3.  Gabapentin 600 mg three times daily.  4.  Lisinopril 5 mg daily.  5.  Metformin 1000 twice daily.  6.  Tramadol as needed for pain.  7.  Zoloft 100 mg daily.  PAST SURGICAL HISTORY: 1.  Cholecystectomy.  2.  Left great toe debridement at bedside.   FAMILY HISTORY:  Positive for coronary artery disease.  Dad died at age of 50 from massive heart attack.   SOCIAL HISTORY:  The patient smokes a pack of cigarettes every day.  Denies any alcohol.  He is married,  but he has been laid off since 2007.   REVIEW OF SYSTEMS:  CONSTITUTIONAL:  Positive for fever, fatigue and weakness.  EYES:  No blurred or double vision, glaucoma or cataracts.  EARS, NOSE, THROAT:  No tinnitus, ear pain, hearing loss.  RESPIRATORY:  No cough, wheeze or hemoptysis or dyspnea.  CARDIOVASCULAR:  No chest pain, orthopnea, edema or arrhythmia.  GASTROINTESTINAL:  No nausea, vomiting, diarrhea, abdominal pain or GERD.  GENITOURINARY:  No dysuria, hematuria.  ENDOCRINE:  No polyuria, nocturia or thyroid problems.  HEMATOLOGY:  No anemia or easy bruising.  SKIN:  No acne or rash.  MUSCULOSKELETAL:  Positive for arthritis and back pain.   NEUROLOGIC:  Positive for bilateral peripheral neuropathy.  No ataxia, dementia or vertigo.  PSYCHIATRIC:  No anxiety.  Positive for depression.  All other systems reviewed and negative.   PHYSICAL EXAMINATION: GENERAL:  The patient is awake, alert, oriented x 3, not in acute distress.  VITAL SIGNS:  Temperature is 102.3, pulse is 119, blood pressure is 125/66.  Pulse ox is 94% on room air.   HEENT:  Atraumatic, normocephalic.  Pupils PERRLA.  EOM intact.  Oral mucosa is moist.  NECK:  Supple.  No JVD.  No carotid bruit.  LUNGS:  Clear to auscultation bilaterally.  No rales, rhonchi, respiratory distress or labored breathing.  HEART:  Both the heart sounds are normal.  Rate, rhythm regular.  PMI not lateralized.  CHEST:  Nontender, good pedal pulses.  Good femoral pulses.  No lower extremity edema.  ABDOMEN:  Soft, benign, nontender.  No organomegaly.  Positive bowel sounds.  NEUROLOGIC:  Grossly intact cranial nerves II through XII.  No motor deficit.  The patient does have bilateral peripheral sensory neuropathy.  EXTREMITIES:  Good pedal pulses, good femoral pulses.  No lower extremity edema.  The patient has a large necrotic ulcer on the left great toe with puss and necrotic margins.  It appears to be affecting the phalanges.  The patient's  toe appears blue and cyanotic.  There is some surrounding cellulitis.  PSYCHIATRIC:  The patient is awake, alert, oriented x 3.   LABORATORY DATA:  Glucose is 180, BUN 29, creatinine 1.20, sodium is 130, potassium is 4.1, chloride is 98, bicarb is 25.  LFTs within normal limits.  Albumin is 2.5.  H and H is 11.4 and 33.2.  White count is 19.8, platelet count is 302.  Wound culture shows heavy growth of staph aureus which is done on 5th of September which is sensitive to oxacillin, clindamycin, gentamicin, linezolid and Tygacil.   ASSESSMENT AND PLAN:  A 54 year old Brian [name] who is being admitted with:  1.  Systemic inflammatory response syndrome secondary to left great toe diabetic foot ulcer.   The patient has progressive increasing ulcer, now presents with systemic inflammatory response syndrome with fever, tachycardia and elevated white count.  We will continue the patient on IV vancomycin and cefazolin for now.  I discussed the case with Dr. Cleda Mccreedy from podiatry who is going to see patient in consultation.  We will order some plain x-rays of the foot to look for osteomyelitis.  We will follow up blood cultures, follow up white count.  Tylenol as needed for fever.  2.  Type 2 diabetes.  Continue metformin and sliding scale insulin.  3.  Hypertension.  The patient is on quinapril.   4.  Severe peripheral neuropathy bilaterally secondary to diabetes.  The patient is on gabapentin, which we will continue.  5.  Heparin subQ three times daily for DVT prophylaxis.  6.  Hyperlipidemia, on atorvastatin.  7.  Further work-up according to the patient's clinical course, hospital admission.  Plan was discussed with the patient who is agreeable to it.  Plan was also discussed with Dr. Cleda Mccreedy from podiatry.   TIME SPENT:  50 minutes.    ____________________________ Hart Rochester Posey Pronto, MD sap:ea D: 11/03/2012 19:24:00 ET T: 11/03/2012 21:50:05 ET JOB#: [id number]  cc: Metztli Sachdev A. Posey Pronto, MD, <Dictator> Ilda Basset  MD ELECTRONICALLY SIGNED 11/07/2012 8:23

## 2014-06-23 NOTE — H&P (Signed)
PATIENT NAME:  Brian [name], Brian [name] MR#:  [id number] DATE OF BIRTH:  17-Dec-1960  DATE OF ADMISSION:  04/11/2014  PRIMARY CARE PROVIDER: Meindert A. Brunetta Genera, MD    CHIEF COMPLAINT: Abdominal pain, nausea, poor appetite.  HISTORY OF PRESENTING ILLNESS: A 54 year old morbidly obese male patient with a history of diabetes, hypertension, peripheral neuropathy, chronic back pain, presents to the hospital complaining of epigastric abdominal pain, nausea, poor appetite since 4 days. The patient has been unable to eat anything due to worsening pain, nausea. Here in the Emergency Room he has been found to have elevated lipase along with elevated AST, ALT, alkaline phosphatase with a normal CT scan of the abdomen and normal bilirubin. The patient is being admitted for acute pancreatitis. The patient does not drink any alcohol, had cholecystectomy about 20 years back. CBD size is normal in the CT scan.   He has had a normal bowel movement 2 days back. No fever.   PAST MEDICAL HISTORY:  1.  Peripheral neuropathy.  2.  Type 2 diabetes mellitus.  3.  Chronic back pain.  4.  Hypertension.  5.  Depression.   ALLERGIES: NAPROXEN, WHICH CAUSES A RASH.   PAST SURGICAL HISTORY: 1.  Cholecystectomy.  2.  Left great toe debridement.   FAMILY HISTORY: Positive for CAD; dad died at age 34 from a heart attack.   SOCIAL HISTORY: The patient smokes a pack a day; he is trying to stop this. Does not drink any alcohol. Lives at home, ambulates on his own.  REVIEW OF SYSTEMS:  CONSTITUTIONAL: Complains of some fatigue.  EYES: No blurred vision, pain, redness.  EARS, NOSE, AND THROAT: No tinnitus, ear pain, hearing loss.  RESPIRATORY: No cough, wheeze, hemoptysis.  CARDIOVASCULAR: No chest pain, orthopnea, edema.  GASTROINTESTINAL: Has nausea but no vomiting. Has abdominal pain.  GENITOURINARY: No dysuria, hematuria, frequency.  ENDOCRINE: No polyuria, nocturia, thyroid problems. HEMATOLOGIC AND LYMPHATIC: No  anemia, easy bruising, bleeding. INTEGUMENTARY: No acne, rash, lesion.  MUSCULOSKELETAL: Has chronic back pain. NEUROLOGICAL: Has peripheral neuropathy.  PSYCHIATRIC: No anxiety or depression.   HOME MEDICATIONS: 1.  Atorvastatin 20 mg 2 tablets daily.  2.  Flexeril 10 mg every 8 hours as needed. 3.  Gabapentin 600 mg 3 times a day.  4.  Glyxambi 10/5 mg oral once a day in the morning.  5.  Lisinopril 5 mg daily.  6.  Metformin 1000 mg 2 times a day.  7.  Sertraline 100 mg oral once a day.  8.  Tramadol 50 mg oral 2 times a day as needed for pain.   PHYSICAL EXAMINATION: Shows:   VITAL SIGNS: Temperature 98.2, pulse of 107, respirations 20, blood pressure 191/92, saturating 94% on room air.  GENERAL: Morbidly obese Caucasian male patient sitting up in bed in some distress secondary to his abdominal pain.  PSYCHIATRIC: Alert and oriented x 3, pleasant.  HEENT: Atraumatic, normocephalic. Oral mucosa is dry and pink. External ears and nose normal. No pallor. No icterus. Pupils bilaterally equal and reactive to light.  NECK: Supple. No thyromegaly. No palpable lymph nodes. Trachea midline. No bruits or JVD.  CARDIOVASCULAR: S1, S2, tachycardic without any murmurs. Peripheral pulses 2+. No edema.  RESPIRATORY: Normal work of breathing, has mild expiratory wheezes.  GASTROINTESTINAL: Soft abdomen. Tenderness in the epigastric and left upper quadrant area. No rigidity or guarding. Bowel sounds present, distended due to obesity.  SKIN: No petechiae, rash, ulcers.  MUSCULOSKELETAL: No joint swelling, redness, effusion of the large joints. Normal muscle  tone.  NEUROLOGICAL: Motor strength 5/5 in upper extremities. Decreased sensations in the lower extremities.  LYMPHATIC: No cervical lymphadenopathy.   LABORATORY STUDIES: Glucose of 290, BUN 24, creatinine 1.28, sodium 134, potassium 3.9 with lipase of 1000. Alcohol less than 3.   AST and ALT are 287 and 709 with alkaline phosphatase of 440.  Bilirubin 0.4. Troponin 0.02. WBC 12.6, hemoglobin 14.5, platelets of 236,000.   INR 0.9. Urinalysis shows no bacteria. Acetaminophen serum less than 2.   CT scan of the abdomen and pelvis with contrast shows mild hepatic fatty infiltration. No focal hepatic mass. No hydronephrosis, hydroureter. Status post cholecystectomy. Moderate stool in the right colon and transverse colon. No bowel obstruction.   ASSESSMENT AND PLAN:  1.  Acute pancreatitis of unknown etiology. The patient does not drink any alcohol, had cholecystectomy in the past, and his common bile duct is of normal size at this time. The patient will be admitted for pain control, symptomatic management. We will put him on full liquid diet at this point. We will check a triglyceride level. With his history of diabetes, obesity, chances of hypertriglyceridemia. Further management as per his triglyceride levels. Also, with associated increased AST, ALT, and alkaline phosphatase, we will request gastroenterology to see the patient for further input. At this point, his elevated liver enzymes could be from fatty liver disease with his normal bilirubin, or it could be ductal disease. Will check hepatitis A, B, C levels. Start him on intravenous fluids. Further management as per his lipase trend.  2.  Diabetes mellitus. Continue his home medications and put him on sliding scale insulin.  3.  Chronic pain syndrome. The patient will be on pain medications for his pancreatitis.  4.  Hypertension. Continue home medications.  5.  Deep vein thrombosis prophylaxis with Lovenox.  6. Tobacco abuse - Counselled to quit > 56min  CODE STATUS: Full code.   TIME SPENT ON THIS CASE: 40 minutes.    ____________________________ Leia Alf Taelyn Broecker, MD srs:ST D: 04/11/2014 14:56:47 ET T: 04/11/2014 15:51:23 ET JOB#: [id number]  cc: Alveta Heimlich R. Raford Brissett, MD, <Dictator> Meindert A. Brunetta Genera, MD Neita Carp MD ELECTRONICALLY SIGNED 04/16/2014 19:11

## 2014-06-23 NOTE — Consult Note (Signed)
PATIENT NAME:  Brian [name], Brian [name] MR#:  [id number] DATE OF BIRTH:  08-01-1960  DATE OF CONSULTATION:  04/12/2014  REFERRING PHYSICIAN:   CONSULTING PHYSICIAN:  Andria Meuse, NP  REQUESTING PHYSICIAN:  Dr. Darvin Neighbours.    PRIMARY CARE PHYSICIAN: Dr. Brunetta Genera.   REASON FOR CONSULTATION: Acute pancreatitis and hepatic steatosis.   HISTORY OF PRESENT ILLNESS: Brian [name] is a 54 year old Caucasian male who has morbid obesity, diabetes mellitus, hypertension, peripheral neuropathy, and chronic back pain, who presented to the hospital after 4 days of severe epigastric pain yesterday. He tells me the pain was 10 out of 10, but currently has resolved. He states the pain was worse with eating. It was described as a dull nagging pain, it did radiate to his back. He denies any history of pancreatitis. He tells me he has not had a bowel movement in 3 days. He denies any nausea or vomiting. He denies any jaundice. He had a cholecystectomy 20 years ago. He denies any alcohol history. He does take tramadol as needed for back pain. The only new medication he has started has been cyclobenzaprine a couple of weeks ago. He denies any heartburn, indigestion. Denies any history of illicit drug use. His weight has been stable. He has a history of hyperlipidemia and is on atorvastatin for this. He has diabetes mellitus which has been relatively stable. He has never had a screening colonoscopy. He denies history of hepatitis B vaccine series. He does have tattoos. He denies any history of transfusion and he has history of multiple sexual partners. His triglycerides are found to be 524.  CT scan with IV contrast showed mild hepatic steatosis, moderate stool in the right and transverse colon and chronic degenerative changes in the thoracicolumbar spine. His lipase was 1046 on admission, total bilirubin was normal. His alkaline phosphatase was 440 and down to 322 today, AST 287 down to 86 today, ALT 709 down to 407 today. His white  blood cell count was 12.6 on admission.   PAST MEDICAL AND SURGICAL HISTORY: Peripheral neuropathy, morbid obesity, type 2 diabetes mellitus, chronic back pain, hypertension, depression, hyperlipidemia.   MEDICATIONS PRIOR TO ADMISSION: Atorvastatin 20 mg 2 tablets daily, Flexeril 10 mg q. 8 hours p.r.n., gabapentin 600 mg t.i.d., Glyxambi 10/5 mg daily in the morning, lisinopril 5 mg daily, metformin 1 gram b.i.d., sertraline 100 mg daily, tramadol 50 mg b.i.d. as needed for pain.  ALLERGIES: NAPROXEN CAUSES RASH.   FAMILY HISTORY: There is no known family history of chronic liver disease or pancreatic problems. No colorectal carcinoma or chronic GI problems. He has 1 healthy daughter.   SOCIAL HISTORY:  He works in Aeronautical engineer. He has a 30 + year history of tobacco use. Denies any illicit drug use or heavy alcohol history.   REVIEW OF SYSTEMS:  See HPI.   CONSTITUTIONAL: He does have fatigue.  MUSCULOSKELETAL: He has chronic low back pain and joint pain.  NEUROLOGICAL: He has chronic peripheral neuropathy.   All other review of systems is negative.   PHYSICAL EXAMINATION:   VITAL SIGNS:  BMI 43.7, weight 296, height 68.9 inches, temperature 97.9, pulse 80, respirations 18, blood pressure 144/81, O2 saturation 96% on room air.  GENERAL: He is a well-developed, morbidly obese Caucasian male who is alert, oriented, pleasant, cooperative, in no acute distress.  HEENT: Sclerae clear, nonicteric. Conjunctivae pink. Oropharynx pink and moist without lesions.  NECK: Supple without mass or thyromegaly.  CHEST: Heart regular rate and rhythm. Normal S1, S2 without  murmurs, clicks, rubs, or gallops.  LUNGS: Clear to auscultation bilaterally.  ABDOMEN: Protuberant with positive bowel sounds x 4. No bruits are auscultated. Abdomen is soft, nontender, nondistended, without hepatosplenomegaly or mass. Exam is limited given patient's body habitus.  EXTREMITIES: Without clubbing or edema  bilaterally.  SKIN: Pink, warm and dry without any rash or jaundice.  NEUROLOGIC: Grossly intact.  MUSCULOSKELETAL: Good equal movement and strength bilaterally.  PSYCHIATRIC: Alert, cooperative, normal mood and affect.   LABORATORY STUDIES: See HPI. Glucose elevated, BUN 21, calcium 8.1, otherwise normal basic metabolic panel. Ethanol was less than 3. Total protein 6.9, albumin 2.3. Troponin was negative [place]. Hemoglobin 14.5, hematocrit 43.6, platelets 236,000. INR 0.9. Acetaminophen less than 2.     IMPRESSION: Brian [name] is a very pleasant 54 year old obese Caucasian male admitted with acute idiopathic pancreatitis which I suspect is resolving given his decrease in pain, lack of pancreatitis on CT, and the fact that he suffered with pain for 4 days prior to coming to the hospital. He also has hepatic steatosis with significantly elevated LFTs. There is no history of alcohol use and he is status post cholecystectomy. He does have hypertriglyceridemia. We will check for autoimmune pancreatitis. Agree with checking for acute viral hepatitis. LFTs are improving and I suspect this elevation was due to acute illness, drug reaction, or underlying nonalcoholic fatty liver disease. No evidence of obstruction or cholelithiasis.   PLAN:  1.  Colace b.i.d.  2.  Advance to low-fat, low-cholesterol, bland diet as tolerated.  3.  Follow LFTs to baseline as outpatient.  4.  Hepatitis B surface antibody and total hepatitis A for immunity given his history of fatty liver disease.  5.  ANA and IgG-4.  6. As a side note he will follow up for at risk outpatient screening colonoscopy as well.   Thank you for allowing [place] to participate in his care.   Dr. Candace Cruise will be covering for [place] over the weekend.    ____________________________ Andria Meuse, NP klj:bu D: 04/12/2014 14:55:31 ET T: 04/12/2014 15:12:25 ET JOB#: [id number]  cc: Andria Meuse, NP, <Dictator> Andria Meuse FNP ELECTRONICALLY  SIGNED 04/25/2014 21:09

## 2014-06-23 NOTE — Discharge Summary (Signed)
PATIENT NAME:  Brian [name], Brian [name] MR#:  [id number] DATE OF BIRTH:  07-17-1960  DATE OF ADMISSION:  04/11/2014 DATE OF DISCHARGE:  04/13/2014  PRESENTING COMPLAINT: Abdominal pain, poor appetite.   PRIMARY CARE PHYSICIAN: Lorelee Market, MD  DISCHARGE DIAGNOSES: 1.  Acute pancreatitis.  2.  Transaminitis.  3.  Diabetes mellitus type 2 with peripheral neuropathy.  4.  Hypertension.  5.  Morbid obesity.  CONSULTATIONS: Tamsen Snider, nurse practitioner.   PROCEDURES: CT scan of the abdomen and pelvis with contrast February 18th showing mild hepatic fatty infiltration, no focal hepatic mass. No hydronephrosis or hydroureter. Bilateral renal symmetrical excretion. Moderate stool noted in the right and transverse colon. No pericecal inflammation. Normal appendix. No small bowel obstruction. No ascites or free air.  HISTORY OF PRESENT ILLNESS: This 54 year old morbidly obese male with history of diabetes, hypertension, peripheral neuropathy and chronic back pain presents to the hospital complaining of epigastric abdominal pain, nausea and decreased appetite for 4 days. On presentation to the Emergency Room, he had elevated lipase as well as transaminitis. He was admitted for acute pancreatitis.  HOSPITAL COURSE BY PROBLEM:  1.  Acute pancreatitis: The patient was placed n.p.o. and had IV fluid resuscitation. He was seen by gastroenterology. He denied any recent alcohol intake. He did have hypertriglyceridemia with triglycerides of 524. Presenting lipase was 1046 which decreased to 634 by the time of discharge. He felt very comfortable and was eating well at the time of discharge. He felt his pain was manageable and he did not need any additional pain medication upon discharge. He will need to be followed in the outpatient setting by his primary care physician. He will also be followed by gastroenterology as an outpatient.  2.  Transaminitis: The patient is status post cholecystectomy. Denies current  alcohol use. Does have fatty liver disease and acute pancreatitis. Likely chronic inflammation due to fatty liver disease with acute pancreatitis contributing. He will follow up with gastroenterology as an outpatient. Would benefit from weight loss. ANA was negative. Hepatitis profile negative.   3.  Diabetes mellitus type 2 with complications of peripheral neuropathy: Blood sugars were elevated initially, but then came under control during hospitalization. Hemoglobin A1c was not checked during this hospitalization, but should be checked soon in the outpatient setting to ensure that his outpatient regimen is appropriate  4.  Depression: He continues on Zoloft.  5.  Hypertension: Blood pressure well controlled. Continue with lisinopril.  DISCHARGE MEDICATIONS: 1.  Metformin 1000 mg 1 tablet twice a day.  2.  Atorvastatin 20 mg 2 tablets once a day for cholesterol.  3.  Sertraline 100 mg 1 tablet once a day.  4.  Lisinopril 5 mg 1 tablet once a day.  5.  Gabapentin 300 mg 2 capsules orally 3 times a day for pain.  6.  Tramadol 50 mg 1 tablet twice a day as needed for pain.  7.  Cyclobenzaprine 10 mg 1 tablet every 8 hours as needed for muscle spasm.  8.  Glyxambi 10 mg/5 mg 1 tablet orally once a day in the morning.   DISCHARGE PHYSICAL EXAMINATION: VITAL SIGNS: Temperature 97.6, pulse 89, respirations 18, blood pressure 149/94, oxygenation 96% on room air.  GENERAL: No acute distress.  CARDIOVASCULAR: Regular rate and rhythm. No murmurs, rubs, or gallops.  PULMONARY: Lungs clear to auscultation bilaterally with good air movement.  ABDOMEN: Soft, very mild tenderness in the midepigastric region. No guarding, no rebound, no hepatosplenomegaly. Obese abdomen. Bowel sounds are normal.  PSYCHIATRIC:  The patient alert and oriented with good insight into clinical conditions.   DIAGNOSTIC DATA: Sodium 138, potassium 4, chloride 105, bicarb 26, BUN 21, creatinine 1.09, glucose 173. Triglycerides 524,  lipase on discharge 634. Total protein 6.6, albumin 2.3, bilirubin 0.2, alk phos 257, AST 36, ALT 261. White blood cells 12.6, hemoglobin 14.5, platelets 236,000, MCV 91. INR 0.9.   CONDITION ON DISCHARGE: Stable.   DISPOSITION: Discharged to home with no further home health needs.   DISCHARGE DIET: Heart healthy, low fat, low cholesterol ad carbohydrate modified diet.   DISCHARGE ACTIVITY: As tolerated.   DISCHARGE FOLLOWUP:  1.  Follow up with primary care within 1 to 2 weeks of discharge.  2.  Follow up with gastroenterology on February 29th as scheduled.   TIME SPENT ON DISCHARGE: 45 minutes.  ____________________________ Earleen Newport. Volanda Napoleon, MD cpw:sb D: 04/16/2014 09:03:00 ET T: 04/16/2014 11:41:57 ET JOB#: [id number]  cc: Meindert A. Brunetta Genera, MD Earleen Newport. Volanda Napoleon, MD, <Dictator> Aldean Jewett MD ELECTRONICALLY SIGNED 04/29/2014 10:42

## 2014-06-23 NOTE — Consult Note (Signed)
Brief Consult Note: Diagnosis: Acute pancreatitis, hepatic steatosis, elevated LFTS.   Patient was seen by consultant.   Comments: [name] is a very pleasant 54 y/o obese caucasian male admitted with mild acute idiopathic pancreatitis & hepatic steatosis with significantly elevated LFTS.  No hx of ETOH use & he is s/p cholecystectomy.  He does have hypertriglyceridemia.  Will check for autoimmune pancreatitis.  Agree with checking for acute viral hepatitis.  LFTS are improving & suspect this elevation was due to acute illness, drug reaction or underlying nonalcoholic fatty liver disease. No evidence of obstruction or cholelithiasis.    Plan: 1) Colace BID 2) Advance to low fat, low cholesteral, bland diet as tolerated 3) Follow LFTS to baseline outpatient 4) HBVsAb, total HepA for immunity 5) ANA, IgG-4 6) as a side note, he will followup for average risk outpatient screening colonoscopy as well  Thanks for allowing [place] to participate in her care.  Please see full dictated note. [id number].  Electronic Signatures for Addendum Section:  Brian [name] (NP) (Signed Addendum 19-Feb-16 10:50)  Dr Candace Cruise will be covering for [place] over the weekend   Electronic Signatures: Brian [name] (NP)  (Signed 19-Feb-16 14:55)  Authored: Brief Consult Note   Last Updated: 19-Feb-16 14:55 by Brian [name] (NP)

## 2014-08-12 ENCOUNTER — Emergency Department
Admission: EM | Admit: 2014-08-12 | Discharge: 2014-08-12 | Disposition: A | Discharge status: Home / Self Care | Payer: Managed Care, Other (non HMO) | Attending: Emergency Medicine | Admitting: Emergency Medicine

## 2014-08-12 ENCOUNTER — Encounter: Payer: Self-pay | Admitting: *Deleted

## 2014-08-12 ENCOUNTER — Other Ambulatory Visit: Payer: Self-pay

## 2014-08-12 DIAGNOSIS — K859 Acute pancreatitis without necrosis or infection, unspecified: Secondary | ICD-10-CM | POA: Insufficient documentation

## 2014-08-12 DIAGNOSIS — R197 Diarrhea, unspecified: Secondary | ICD-10-CM | POA: Diagnosis not present

## 2014-08-12 DIAGNOSIS — E119 Type 2 diabetes mellitus without complications: Secondary | ICD-10-CM | POA: Diagnosis not present

## 2014-08-12 DIAGNOSIS — G629 Polyneuropathy, unspecified: Secondary | ICD-10-CM | POA: Insufficient documentation

## 2014-08-12 DIAGNOSIS — Z720 Tobacco use: Secondary | ICD-10-CM | POA: Diagnosis not present

## 2014-08-12 DIAGNOSIS — R1010 Upper abdominal pain, unspecified: Secondary | ICD-10-CM | POA: Diagnosis present

## 2014-08-12 DIAGNOSIS — I10 Essential (primary) hypertension: Secondary | ICD-10-CM | POA: Insufficient documentation

## 2014-08-12 HISTORY — DX: Polyneuropathy, unspecified: G62.9

## 2014-08-12 HISTORY — DX: Pure hypercholesterolemia, unspecified: E78.00

## 2014-08-12 HISTORY — DX: Essential (primary) hypertension: I10

## 2014-08-12 HISTORY — DX: Acute pancreatitis without necrosis or infection, unspecified: K85.90

## 2014-08-12 LAB — COMPREHENSIVE METABOLIC PANEL
ALBUMIN: 3.2 g/dL — AB (ref 3.5–5.0)
ALT: 13 U/L — AB (ref 17–63)
AST: 18 U/L (ref 15–41)
Alkaline Phosphatase: 66 U/L (ref 38–126)
Anion gap: 4 — ABNORMAL LOW (ref 5–15)
BILIRUBIN TOTAL: 0.4 mg/dL (ref 0.3–1.2)
BUN: 29 mg/dL — ABNORMAL HIGH (ref 6–20)
CHLORIDE: 105 mmol/L (ref 101–111)
CO2: 24 mmol/L (ref 22–32)
Calcium: 9.2 mg/dL (ref 8.9–10.3)
Creatinine, Ser: 1.57 mg/dL — ABNORMAL HIGH (ref 0.61–1.24)
GFR calc Af Amer: 56 mL/min — ABNORMAL LOW (ref >60)
GFR, EST NON AFRICAN AMERICAN: 48 mL/min — AB (ref >60)
Glucose, Bld: 265 mg/dL — ABNORMAL HIGH (ref 65–99)
Potassium: 3.9 mmol/L (ref 3.5–5.1)
Sodium: 133 mmol/L — ABNORMAL LOW (ref 135–145)
Total Protein: 7.2 g/dL (ref 6.5–8.1)

## 2014-08-12 LAB — URINALYSIS COMPLETE WITH MICROSCOPIC (ARMC ONLY)
Bilirubin Urine: NEGATIVE
Glucose, UA: >500 mg/dL — AB
Ketones, ur: NEGATIVE mg/dL
Leukocytes, UA: NEGATIVE
NITRITE: NEGATIVE
Specific Gravity, Urine: 1.037 — ABNORMAL HIGH (ref 1.005–1.030)
Squamous Epithelial / LPF: NONE SEEN
pH: 5 (ref 5.0–8.0)

## 2014-08-12 LAB — CBC WITH DIFFERENTIAL/PLATELET
BASOS PCT: 1 %
Basophils Absolute: 0.2 10*3/uL — ABNORMAL HIGH (ref 0–0.1)
EOS ABS: 0.4 10*3/uL (ref 0–0.7)
Eosinophils Relative: 2 %
HCT: 39.8 % — ABNORMAL LOW (ref 40.0–52.0)
HEMOGLOBIN: 13.7 g/dL (ref 13.0–18.0)
Lymphocytes Relative: 28 %
Lymphs Abs: 4.4 10*3/uL — ABNORMAL HIGH (ref 1.0–3.6)
MCH: 31.1 pg (ref 26.0–34.0)
MCHC: 34.4 g/dL (ref 32.0–36.0)
MCV: 90.4 fL (ref 80.0–100.0)
Monocytes Absolute: 0.9 10*3/uL (ref 0.2–1.0)
Monocytes Relative: 6 %
NEUTROS PCT: 63 %
Neutro Abs: 9.7 10*3/uL — ABNORMAL HIGH (ref 1.4–6.5)
PLATELETS: 242 10*3/uL (ref 150–440)
RBC: 4.4 MIL/uL (ref 4.40–5.90)
RDW: 14.3 % (ref 11.5–14.5)
WBC: 15.5 10*3/uL — ABNORMAL HIGH (ref 3.8–10.6)

## 2014-08-12 LAB — LIPASE, BLOOD: Lipase: 120 U/L — ABNORMAL HIGH (ref 22–51)

## 2014-08-12 LAB — TROPONIN I: TROPONIN I: 0.03 ng/mL (ref <0.031)

## 2014-08-12 MED ORDER — SODIUM CHLORIDE 0.9 % IV BOLUS (SEPSIS)
1000.0000 mL | Freq: Once | INTRAVENOUS | Status: AC
  Administered 2014-08-12: 1000 mL via INTRAVENOUS

## 2014-08-12 MED ORDER — MORPHINE SULFATE 4 MG/ML IJ SOLN
INTRAMUSCULAR | Status: AC
  Administered 2014-08-12: 4 mg via INTRAVENOUS
  Filled 2014-08-12: qty 1

## 2014-08-12 MED ORDER — OXYCODONE-ACETAMINOPHEN 5-325 MG PO TABS: 1.0000 | ORAL_TABLET | Freq: Four times a day (QID) | ORAL | Status: DC | PRN

## 2014-08-12 MED ORDER — ONDANSETRON HCL 4 MG/2ML IJ SOLN
INTRAMUSCULAR | Status: AC
  Administered 2014-08-12: 4 mg via INTRAVENOUS
  Filled 2014-08-12: qty 2

## 2014-08-12 MED ORDER — ONDANSETRON HCL 4 MG/2ML IJ SOLN
4.0000 mg | Freq: Once | INTRAMUSCULAR | Status: AC
  Administered 2014-08-12: 4 mg via INTRAVENOUS

## 2014-08-12 MED ORDER — MORPHINE SULFATE 4 MG/ML IJ SOLN
4.0000 mg | Freq: Once | INTRAMUSCULAR | Status: AC
  Administered 2014-08-12: 4 mg via INTRAVENOUS

## 2014-08-12 NOTE — ED Notes (Signed)
States has epigastric pain, some nausea, freq stools

## 2014-08-12 NOTE — Discharge Instructions (Signed)

## 2014-08-12 NOTE — ED Provider Notes (Signed)
[facility] Emergency Department Provider Note  Time seen: 6:21 PM  I have reviewed the triage vital signs and the nursing notes.   HISTORY  Chief Complaint Abdominal Pain    HPI Brian [name] is a 54 y.o. male with a past medical history of pancreatitis, diabetes, hypertension, hyperlipidemia, neuropathy, who presents the emergency department with upper abdominal pain 3 days. According to the patient for the past 3 days he has had a dull upper abdominal pain, nausea, but denies vomiting. He states he has had loose stool for the last 3 days as well. He states a similar abdominal pain approximately 3-4 months ago when he was diagnosed with pancreatitis. He states that is only time he has ever been diagnosed with pancreatitis. He had his gallbladder out 1998, he denies any alcohol use. Patient describes his upper abdominal pain is moderate at this time, dull/aching, worse when he eats food.     Past Medical History  Diagnosis Date  . Pancreatitis, acute   . Diabetes mellitus without complication   . Neuropathy   . Hypertension   . Hypercholesteremia     Patient Active Problem List   Diagnosis Date Noted  . Pancreatitis, acute   . Neuropathy   . Hypercholesteremia     Past Surgical History  Procedure Laterality Date  . Amputation    . Cholecystectomy      No current outpatient prescriptions on file.  Allergies Naproxen  No family history on file.  Social History History  Substance Use Topics  . Smoking status: Current Every Day Smoker  . Smokeless tobacco: Not on file  . Alcohol Use: No    Review of Systems Constitutional: Negative for fever. Cardiovascular: Negative for chest pain. Respiratory: Negative for shortness of breath. Gastrointestinal: Positive for upper abdominal pain. As it for nausea and diarrhea. Negative for vomiting. Genitourinary: Negative for dysuria. 10-point ROS otherwise  negative.  ____________________________________________   PHYSICAL EXAM:  VITAL SIGNS: ED Triage Vitals  Enc Vitals Group     BP 08/12/14 1750 122/73 mmHg     Pulse Rate 08/12/14 1750 108     Resp 08/12/14 1750 20     Temp 08/12/14 1750 98.3 F (36.8 C)     Temp Source 08/12/14 1750 Oral     SpO2 08/12/14 1750 97 %     Weight 08/12/14 1750 295 lb (133.811 kg)     Height 08/12/14 1750 5\' 10"  (1.778 m)     Head Cir --      Peak Flow --      Pain Score 08/12/14 1751 9     Pain Loc --      Pain Edu? --      Excl. in [place]? --     Constitutional: Alert and oriented. Well appearing and in no distress. Eyes: Normal exam ENT   Mouth/Throat: Mucous membranes are moist. Cardiovascular: Normal rate, regular rhythm. No murmur Respiratory: Normal respiratory effort without tachypnea nor retractions. Breath sounds are clear  Gastrointestinal: Soft and nontender. No distention.   Musculoskeletal: Nontender with normal range of motion in all extremities. Neurologic:  Normal speech and language. No gross focal neurologic deficits  Skin:  Skin is warm, dry and intact.  Psychiatric: Mood and affect are normal. Speech and behavior are normal.   ____________________________________________    EKG  EKG reviewed and interpreted by myself shows sinus tachycardia 101 bpm, narrow QRS, normal axis, normal intervals, Q wave changes in the inferior leads. No ST  changes noted.  ____________________________________________   INITIAL IMPRESSION / ASSESSMENT AND PLAN / ED COURSE  Pertinent labs & imaging results that were available during my care of the patient were reviewed by me and considered in my medical decision making (see chart for details).  Patient with 3 days of upper abdominal pain, nausea and loose stool. We will check labs, pain and nausea, and IV hydrate and closely monitoring in the emergency department.  He says labs show elevated lipase, consistent with pancreatitis. I  discussed the findings with the patient, and offered the patient admission versus a trial of outpatient therapy. The patient wishes to try to go home. We will send the patient home on Percocet, I discussed dietary precautions with the patient, as well as strict return precautions for increased abdominal pain, nausea, vomiting. Patient is agreeable to plan. He plans to follow up with [facility].  ____________________________________________   FINAL CLINICAL IMPRESSION(S) / ED DIAGNOSES  Upper abdominal pain Pancreatitis  Harvest Dark, MD 08/12/14 [phone]

## 2014-08-28 ENCOUNTER — Encounter: Payer: Self-pay | Admitting: *Deleted

## 2014-08-28 ENCOUNTER — Emergency Department
Admission: EM | Admit: 2014-08-28 | Discharge: 2014-08-28 | Disposition: A | Discharge status: Home / Self Care | Payer: Managed Care, Other (non HMO) | Attending: Emergency Medicine | Admitting: Emergency Medicine

## 2014-08-28 DIAGNOSIS — E119 Type 2 diabetes mellitus without complications: Secondary | ICD-10-CM | POA: Insufficient documentation

## 2014-08-28 DIAGNOSIS — Y9289 Other specified places as the place of occurrence of the external cause: Secondary | ICD-10-CM | POA: Insufficient documentation

## 2014-08-28 DIAGNOSIS — W01198A Fall on same level from slipping, tripping and stumbling with subsequent striking against other object, initial encounter: Secondary | ICD-10-CM | POA: Diagnosis not present

## 2014-08-28 DIAGNOSIS — Y9389 Activity, other specified: Secondary | ICD-10-CM | POA: Insufficient documentation

## 2014-08-28 DIAGNOSIS — S0101XA Laceration without foreign body of scalp, initial encounter: Secondary | ICD-10-CM | POA: Diagnosis not present

## 2014-08-28 DIAGNOSIS — Y998 Other external cause status: Secondary | ICD-10-CM | POA: Diagnosis not present

## 2014-08-28 DIAGNOSIS — I10 Essential (primary) hypertension: Secondary | ICD-10-CM | POA: Insufficient documentation

## 2014-08-28 DIAGNOSIS — Z791 Long term (current) use of non-steroidal anti-inflammatories (NSAID): Secondary | ICD-10-CM | POA: Insufficient documentation

## 2014-08-28 DIAGNOSIS — E785 Hyperlipidemia, unspecified: Secondary | ICD-10-CM | POA: Insufficient documentation

## 2014-08-28 DIAGNOSIS — Z720 Tobacco use: Secondary | ICD-10-CM | POA: Insufficient documentation

## 2014-08-28 DIAGNOSIS — S0990XA Unspecified injury of head, initial encounter: Secondary | ICD-10-CM | POA: Diagnosis present

## 2014-08-28 MED ORDER — TRAMADOL HCL 50 MG PO TABS: 50.0000 mg | ORAL_TABLET | Freq: Four times a day (QID) | ORAL | Status: DC | PRN

## 2014-08-28 MED ORDER — LIDOCAINE HCL (PF) 1 % IJ SOLN
INTRAMUSCULAR | Status: AC
  Filled 2014-08-28: qty 5

## 2014-08-28 NOTE — ED Notes (Signed)
Pt arrives via EMS, pt was walking into work and slipped on the gravel, pt arrives with head wrapped in gauze, bleeding controlled at this time, pt denies any LOC

## 2014-08-28 NOTE — ED Provider Notes (Signed)
[facility] Emergency Department Provider Note  ____________________________________________  Time seen: Approximately 3:03 PM  I have reviewed the triage vital signs and the nursing notes.   HISTORY  Chief Complaint Fall    HPI MELVON SUKO is a 54 y.o. male patient for laceration to the right occipital area of his hands secondary to a fall. Patient stated he was taking a rate before starting work his knee gave out and he fell backwards striking his head on the gravel. Patient states there was no loss of consciousness but immediately hemorrhaging from the lacerated area state bleeding was eventually controlled with direct pressure. Since onset patient denies any vision disturbances or vertigo. Patient is rating his pain as a 6/10. Patient describes the pain as dull.  Past Medical History  Diagnosis Date  . Pancreatitis, acute   . Diabetes mellitus without complication   . Neuropathy   . Hypertension   . Hypercholesteremia     Patient Active Problem List   Diagnosis Date Noted  . Pancreatitis, acute   . Neuropathy   . Hypercholesteremia     Past Surgical History  Procedure Laterality Date  . Amputation    . Cholecystectomy      Current Outpatient Rx  Name  Route  Sig  Dispense  Refill  . oxyCODONE-acetaminophen (ROXICET) 5-325 MG per tablet   Oral   Take 1 tablet by mouth every 6 (six) hours as needed.   25 tablet   0     Allergies Naproxen  History reviewed. No pertinent family history.  Social History History  Substance Use Topics  . Smoking status: Current Every Day Smoker  . Smokeless tobacco: Not on file  . Alcohol Use: No    Review of Systems Constitutional: No fever/chills Eyes: No visual changes. ENT: No sore throat. Cardiovascular: Denies chest pain. Respiratory: Denies shortness of breath. Gastrointestinal: No abdominal pain.  No nausea, no vomiting.  No diarrhea.  No constipation. Genitourinary: Negative for  dysuria. Musculoskeletal: Negative for back pain. Skin: Negative for rash. Scalp laceration Neurological: Negative for headaches, focal weakness or numbness. Endocrine: Beaty's, hypertension, hyperlipidemia. Hematological/Lymphatic: Allergic/Immunilogical: Naproxen  10-point ROS otherwise negative.  ____________________________________________   PHYSICAL EXAM:  VITAL SIGNS: ED Triage Vitals  Enc Vitals Group     BP 08/28/14 1427 143/76 mmHg     Pulse Rate 08/28/14 1427 98     Resp 08/28/14 1427 20     Temp 08/28/14 1427 99 F (37.2 C)     Temp Source 08/28/14 1427 Oral     SpO2 08/28/14 1427 97 %     Weight 08/28/14 1427 290 lb (131.543 kg)     Height 08/28/14 1427 5\' 9"  (1.753 m)     Head Cir --      Peak Flow --      Pain Score 08/28/14 1427 6     Pain Loc --      Pain Edu? --      Excl. in [place]? --     Constitutional: Alert and oriented. Well appearing and in no acute distress. Eyes: Conjunctivae are normal. PERRL. EOMI. Head: Atraumatic. Small hematoma right occipital area. Nose: No congestion/rhinnorhea. Mouth/Throat: Mucous membranes are moist.  Oropharynx non-erythematous. Neck: No stridor.  No cervical spine tenderness to palpation.{ Hematological/Lymphatic/Immunilogical: No cervical lymphadenopathy. Cardiovascular: Normal rate, regular rhythm. Grossly normal heart sounds.  Good peripheral circulation. Respiratory: Normal respiratory effort.  No retractions. Lungs CTAB. Gastrointestinal: Soft and nontender. No distention. No abdominal bruits. No  CVA tenderness. Musculoskeletal: No lower extremity tenderness nor edema.  No joint effusions. Neurologic:  Normal speech and language. No gross focal neurologic deficits are appreciated. Speech is normal. No gait instability. Skin:  Skin is warm, dry and intact. No rash noted. 1 cm laceration to the right occipital area. He which is controlled. Psychiatric: Mood and affect are normal. Speech and behavior are  normal.  ____________________________________________   LABS (all labs ordered are listed, but only abnormal results are displayed)  Labs Reviewed - No data to display ____________________________________________  EKG   ____________________________________________  RADIOLOGY   ____________________________________________   PROCEDURES  Procedure(s) performed: LACERATION REPAIR Performed by: Sable Feil Authorized by: Sable Feil Consent: Verbal consent obtained. Risks and benefits: risks, benefits and alternatives were discussed Consent given by: patient Patient identity confirmed: provided demographic data Prepped and Draped in normal sterile fashion Wound explored  Laceration Location: Occipital scalp area  Laceration Length: 1 cm cm  No Foreign Bodies seen or palpated  Anesthesia: local infiltration  Local anesthetic: lidocaine 1% without epinephrine  Anesthetic total: 2 ml  Irrigation method: syringe Amount of cleaning: standard  Skin closure: Staples  Number of sutures: 4   Technique: NA  Patient tolerance: Patient tolerated the procedure well with no immediate complications.   Critical Care performed: No  ____________________________________________   INITIAL IMPRESSION / ASSESSMENT AND PLAN / ED COURSE  Pertinent labs & imaging results that were available during my care of the patient were reviewed by me and considered in my medical decision making (see chart for details).  Right cervical scalp laceration. Area was closed with 4 staples. Patient given tramadol take as needed for pain. Patient advised return back in 10 days for staple removal. Return back to the ER if his condition worsens. ____________________________________________   FINAL CLINICAL IMPRESSION(S) / ED DIAGNOSES  Final diagnoses:  Scalp laceration, initial encounter      Sable Feil, PA-C 08/28/14 [place], MD 08/29/14 1535

## 2014-08-28 NOTE — ED Notes (Signed)
Laceration to back of head, right side

## 2014-09-10 ENCOUNTER — Encounter: Payer: Self-pay | Admitting: Emergency Medicine

## 2014-09-10 ENCOUNTER — Emergency Department
Admission: EM | Admit: 2014-09-10 | Discharge: 2014-09-10 | Disposition: A | Discharge status: Home / Self Care | Payer: Managed Care, Other (non HMO) | Attending: Emergency Medicine | Admitting: Emergency Medicine

## 2014-09-10 DIAGNOSIS — X58XXXD Exposure to other specified factors, subsequent encounter: Secondary | ICD-10-CM | POA: Diagnosis not present

## 2014-09-10 DIAGNOSIS — Z4802 Encounter for removal of sutures: Secondary | ICD-10-CM | POA: Insufficient documentation

## 2014-09-10 DIAGNOSIS — I10 Essential (primary) hypertension: Secondary | ICD-10-CM | POA: Diagnosis not present

## 2014-09-10 DIAGNOSIS — E119 Type 2 diabetes mellitus without complications: Secondary | ICD-10-CM | POA: Insufficient documentation

## 2014-09-10 DIAGNOSIS — S0191XD Laceration without foreign body of unspecified part of head, subsequent encounter: Secondary | ICD-10-CM | POA: Diagnosis not present

## 2014-09-10 DIAGNOSIS — Z720 Tobacco use: Secondary | ICD-10-CM | POA: Insufficient documentation

## 2014-09-10 NOTE — ED Notes (Signed)
Suture removal to posterior scalp, no signs of infection , 4 staples present

## 2014-09-10 NOTE — ED Provider Notes (Signed)
CSN: [id number]     Arrival date & time 09/10/14  [id number] History   First MD Initiated Contact with Patient 09/10/14 [phone]     Chief Complaint  Patient presents with  . Suture / Staple Removal    HPI Comments: 54 year old male presents today for staple removal. He was seen here on July 6 by Mardee Postin, PA-C for occipital head laceration. Records indicate 4 staples were placed at that time. He has not had any discharge from the wound or headaches.  Patient is a 54 y.o. male presenting with suture removal. The history is provided by the patient.  Suture / Staple Removal This is a new problem. Episode onset: 13 days ago. The problem has been unchanged. Pertinent negatives include no fever, headaches or neck pain. Nothing aggravates the symptoms. He has tried nothing for the symptoms. The treatment provided no relief.    Past Medical History  Diagnosis Date  . Pancreatitis, acute   . Diabetes mellitus without complication   . Neuropathy   . Hypertension   . Hypercholesteremia    Past Surgical History  Procedure Laterality Date  . Amputation    . Cholecystectomy     History reviewed. No pertinent family history. History  Substance Use Topics  . Smoking status: Current Every Day Smoker  . Smokeless tobacco: Not on file  . Alcohol Use: No    Review of Systems  Constitutional: Negative for fever.  Musculoskeletal: Negative for neck pain.  Skin: Positive for wound.  Neurological: Negative for headaches.  All other systems reviewed and are negative.     Allergies  Naproxen  Home Medications   Prior to Admission medications   Medication Sig Start Date End Date Taking? Authorizing Provider  oxyCODONE-acetaminophen (ROXICET) 5-325 MG per tablet Take 1 tablet by mouth every 6 (six) hours as needed. 08/12/14   Harvest Dark, MD  traMADol (ULTRAM) 50 MG tablet Take 1 tablet (50 mg total) by mouth every 6 (six) hours as needed for moderate pain. 08/28/14   Sable Feil, PA-C    BP 159/83 mmHg  Pulse 106  Temp(Src) 98.8 F (37.1 C) (Oral)  Resp 20  Ht 5\' 9"  (1.753 m)  Wt 284 lb (128.822 kg)  BMI 41.92 kg/m2  SpO2 98% Physical Exam  Constitutional: He is oriented to person, place, and time. He appears well-developed and well-nourished.  HENT:  Head: Normocephalic and atraumatic.  Right occipital scalp with 4 staples in place, wound is well approximated no infection  Neurological: He is alert and oriented to person, place, and time.  Skin: Skin is warm and dry.  Psychiatric: He has a normal mood and affect. His behavior is normal. Judgment and thought content normal.  Nursing note and vitals reviewed.   ED Course  Procedures (including critical care time) Labs Review Labs Reviewed - No data to display  Imaging Review No results found.   EKG Interpretation None      MDM  I removed 4 staples from occipital head laceration. No bleeding, patient tolerated procedure well. Wound is well approximated Final diagnoses:  Laceration of head, subsequent encounter  Encounter for staple removal       Corliss Parish, PA-C 09/10/14 0919  Delman Kitten, MD 09/10/14 1537

## 2014-10-08 ENCOUNTER — Encounter: Payer: Self-pay | Admitting: Emergency Medicine

## 2014-10-08 ENCOUNTER — Emergency Department: Payer: Managed Care, Other (non HMO)

## 2014-10-08 ENCOUNTER — Emergency Department
Admission: EM | Admit: 2014-10-08 | Discharge: 2014-10-08 | Disposition: A | Discharge status: Home / Self Care | Payer: Managed Care, Other (non HMO) | Attending: Emergency Medicine | Admitting: Emergency Medicine

## 2014-10-08 DIAGNOSIS — Z720 Tobacco use: Secondary | ICD-10-CM | POA: Insufficient documentation

## 2014-10-08 DIAGNOSIS — R42 Dizziness and giddiness: Secondary | ICD-10-CM | POA: Diagnosis not present

## 2014-10-08 DIAGNOSIS — E119 Type 2 diabetes mellitus without complications: Secondary | ICD-10-CM | POA: Insufficient documentation

## 2014-10-08 DIAGNOSIS — R079 Chest pain, unspecified: Secondary | ICD-10-CM | POA: Insufficient documentation

## 2014-10-08 DIAGNOSIS — I10 Essential (primary) hypertension: Secondary | ICD-10-CM | POA: Diagnosis not present

## 2014-10-08 LAB — CBC
HCT: 38.2 % — ABNORMAL LOW (ref 40.0–52.0)
Hemoglobin: 12.9 g/dL — ABNORMAL LOW (ref 13.0–18.0)
MCH: 30.4 pg (ref 26.0–34.0)
MCHC: 33.8 g/dL (ref 32.0–36.0)
MCV: 90 fL (ref 80.0–100.0)
PLATELETS: 251 10*3/uL (ref 150–440)
RBC: 4.24 MIL/uL — AB (ref 4.40–5.90)
RDW: 13.2 % (ref 11.5–14.5)
WBC: 13.9 10*3/uL — AB (ref 3.8–10.6)

## 2014-10-08 LAB — BASIC METABOLIC PANEL
Anion gap: 10 (ref 5–15)
BUN: 28 mg/dL — ABNORMAL HIGH (ref 6–20)
CO2: 25 mmol/L (ref 22–32)
CREATININE: 1.26 mg/dL — AB (ref 0.61–1.24)
Calcium: 9.4 mg/dL (ref 8.9–10.3)
Chloride: 98 mmol/L — ABNORMAL LOW (ref 101–111)
GFR calc Af Amer: >60 mL/min (ref >60)
Glucose, Bld: 295 mg/dL — ABNORMAL HIGH (ref 65–99)
Potassium: 4.5 mmol/L (ref 3.5–5.1)
SODIUM: 133 mmol/L — AB (ref 135–145)

## 2014-10-08 LAB — TROPONIN I
TROPONIN I: 0.03 ng/mL (ref <0.031)
TROPONIN I: 0.04 ng/mL — AB (ref <0.031)

## 2014-10-08 NOTE — ED Notes (Signed)
Pt to ed with c/o chest pain and dizziness that started today.  Pt also reports sob associated with the chest pain.  Pt states recent issues with balance and had 2 falls over the past week.

## 2014-10-08 NOTE — Discharge Instructions (Signed)

## 2014-10-08 NOTE — ED Provider Notes (Signed)
[facility] Emergency Department Provider Note  Time seen: 8:39 PM  I have reviewed the triage vital signs and the nursing notes.   HISTORY  Chief Complaint Dizziness and Chest Pain    HPI Brian [name] is a 54 y.o. male with a past medical history of pancreatitis, diabetes, neuropathy, hypertension, hyperlipidemia who presents the emergency department with chest pain and dizziness. According to the patient he was at work around 4 PM he felt a tightness in his chest, and a dizziness sensation. Patient came to the emergency department for evaluation, states his symptoms has resolved by the time he arrived at the emergency department. Denies any increased leg pain or swelling from his baseline. Denies any trouble breathing. Denies nausea or diaphoresis. Patient does state in addition he has been feeling off balance with recent falls. He states a history of bad knees, which have resulted in multiple falls over the past few years. He does state he has had increased falls over the past 2 weeks, with 3 falls in 2 weeks. 2 which he hit his head very hard per the patient. But denies loss of consciousness. Denies any headache today. Patient does continue to intermittently feel dizzy and off balance. Patient currently denies any chest pain, or dizziness.     Past Medical History  Diagnosis Date  . Pancreatitis, acute   . Diabetes mellitus without complication   . Neuropathy   . Hypertension   . Hypercholesteremia     Patient Active Problem List   Diagnosis Date Noted  . Pancreatitis, acute   . Neuropathy   . Hypercholesteremia     Past Surgical History  Procedure Laterality Date  . Amputation    . Cholecystectomy      Current Outpatient Rx  Name  Route  Sig  Dispense  Refill  . oxyCODONE-acetaminophen (ROXICET) 5-325 MG per tablet   Oral   Take 1 tablet by mouth every 6 (six) hours as needed.   25 tablet   0   . traMADol (ULTRAM) 50 MG tablet   Oral   Take 1 tablet (50 mg total) by mouth every 6 (six) hours as needed for moderate pain.   12 tablet   0     Allergies Naproxen  History reviewed. No pertinent family history.  Social History Social History  Substance Use Topics  . Smoking status: Current Every Day Smoker  . Smokeless tobacco: None  . Alcohol Use: No    Review of Systems Constitutional: Negative for fever. Cardiovascular: Positive chest pain/tightness, now resolved. Respiratory: Negative for shortness of breath. Gastrointestinal: Negative for abdominal pain, vomiting and diarrhea. Genitourinary: Negative for dysuria. Musculoskeletal: Negative for back pain. Negative for neck pain. Skin: Negative for rash. Neurological: Negative for headaches, focal weakness or numbness. Positive for intermittent dizziness. 10-point ROS otherwise negative.  ____________________________________________   PHYSICAL EXAM:  VITAL SIGNS: ED Triage Vitals  Enc Vitals Group     BP 10/08/14 1818 174/98 mmHg     Pulse Rate 10/08/14 1818 98     Resp 10/08/14 1818 20     Temp 10/08/14 1818 98.7 F (37.1 C)     Temp Source 10/08/14 1818 Oral     SpO2 10/08/14 1818 96 %     Weight 10/08/14 1818 284 lb (128.822 kg)     Height 10/08/14 1818 5\' 9"  (1.753 m)     Head Cir --      Peak Flow --      Pain Score  10/08/14 1819 7     Pain Loc --      Pain Edu? --      Excl. in [place]? --     Constitutional: Alert and oriented. Well appearing and in no distress. Eyes: Normal exam ENT   Head: Normocephalic and atraumatic.   Mouth/Throat: Mucous membranes are moist. Cardiovascular: Normal rate, regular rhythm.  Respiratory: Normal respiratory effort without tachypnea nor retractions. Breath sounds are clear and equal bilaterally. No wheezes/rales/rhonchi. Gastrointestinal: Soft, mild upper abdominal tenderness palpation. No rebound or guarding. No distention. Patient states this is chronic due to pancreatitis. Musculoskeletal:  Nontender with normal range of motion in all extremities. Mild bilateral lower extremity edema, equal bilaterally. Neurologic:  Normal speech and language. No gross focal neurologic deficits  Skin:  Skin is warm, dry and intact.  Psychiatric: Mood and affect are normal. Speech and behavior are normal.  ____________________________________________    EKG  EKG reviewed and interpreted by myself shows normal sinus rhythm at 95 bpm, narrow QRS, normal axis, normal intervals, nonspecific but no concerning ST changes noted.  ____________________________________________    RADIOLOGY  Chest x-ray consistent with asthma/bronchitis. CT head shows no acute abnormality.  ____________________________________________    INITIAL IMPRESSION / ASSESSMENT AND PLAN / ED COURSE  Pertinent labs & imaging results that were available during my care of the patient were reviewed by me and considered in my medical decision making (see chart for details).  Patient with chest pain around 4 PM associated with dizziness. Now denies any symptoms. Patient appears well, with a normal exam currently. Not concerning EKG. Labs are within normal limits. We'll proceed with a CT head to evaluate patient's recent falls, and head injuries. We will also obtain a second troponin at 9 PM to evaluate the patient's chest discomfort, which is now completely resolved.  CT/chest x-ray show no acute findings. Troponin on repeat is 0.04 from 0.03, which is an equivocal change. Patient continues to deny any chest symptoms at this time. We'll discharge patient home with primary care as well as cardiology follow-up. Patient agreeable to plan.  ____________________________________________   FINAL CLINICAL IMPRESSION(S) / ED DIAGNOSES  Chest pain Dizziness   Harvest Dark, MD 10/08/14 2238

## 2015-01-05 ENCOUNTER — Emergency Department: Payer: Self-pay

## 2015-01-05 ENCOUNTER — Encounter: Payer: Self-pay | Admitting: Emergency Medicine

## 2015-01-05 ENCOUNTER — Observation Stay
Admission: EM | Admit: 2015-01-05 | Discharge: 2015-01-08 | Disposition: A | Discharge status: Home / Self Care | Payer: Self-pay | Attending: Surgery | Admitting: Surgery

## 2015-01-05 DIAGNOSIS — D692 Other nonthrombocytopenic purpura: Secondary | ICD-10-CM | POA: Insufficient documentation

## 2015-01-05 DIAGNOSIS — F17210 Nicotine dependence, cigarettes, uncomplicated: Secondary | ICD-10-CM | POA: Insufficient documentation

## 2015-01-05 DIAGNOSIS — E1122 Type 2 diabetes mellitus with diabetic chronic kidney disease: Secondary | ICD-10-CM | POA: Insufficient documentation

## 2015-01-05 DIAGNOSIS — I10 Essential (primary) hypertension: Secondary | ICD-10-CM | POA: Insufficient documentation

## 2015-01-05 DIAGNOSIS — Z8489 Family history of other specified conditions: Secondary | ICD-10-CM | POA: Insufficient documentation

## 2015-01-05 DIAGNOSIS — E7800 Pure hypercholesterolemia, unspecified: Secondary | ICD-10-CM | POA: Insufficient documentation

## 2015-01-05 DIAGNOSIS — Z9049 Acquired absence of other specified parts of digestive tract: Secondary | ICD-10-CM | POA: Insufficient documentation

## 2015-01-05 DIAGNOSIS — E119 Type 2 diabetes mellitus without complications: Secondary | ICD-10-CM

## 2015-01-05 DIAGNOSIS — F17200 Nicotine dependence, unspecified, uncomplicated: Secondary | ICD-10-CM | POA: Diagnosis present

## 2015-01-05 DIAGNOSIS — N182 Chronic kidney disease, stage 2 (mild): Secondary | ICD-10-CM

## 2015-01-05 DIAGNOSIS — I1310 Hypertensive heart and chronic kidney disease without heart failure, with stage 1 through stage 4 chronic kidney disease, or unspecified chronic kidney disease: Secondary | ICD-10-CM | POA: Insufficient documentation

## 2015-01-05 DIAGNOSIS — Z8249 Family history of ischemic heart disease and other diseases of the circulatory system: Secondary | ICD-10-CM | POA: Insufficient documentation

## 2015-01-05 DIAGNOSIS — N183 Chronic kidney disease, stage 3 (moderate): Secondary | ICD-10-CM | POA: Insufficient documentation

## 2015-01-05 DIAGNOSIS — Z6841 Body Mass Index (BMI) 40.0 and over, adult: Secondary | ICD-10-CM | POA: Insufficient documentation

## 2015-01-05 DIAGNOSIS — K3580 Unspecified acute appendicitis: Secondary | ICD-10-CM

## 2015-01-05 DIAGNOSIS — K353 Acute appendicitis with localized peritonitis, without perforation or gangrene: Secondary | ICD-10-CM | POA: Diagnosis present

## 2015-01-05 DIAGNOSIS — E1165 Type 2 diabetes mellitus with hyperglycemia: Secondary | ICD-10-CM | POA: Insufficient documentation

## 2015-01-05 DIAGNOSIS — R9431 Abnormal electrocardiogram [ECG] [EKG]: Secondary | ICD-10-CM | POA: Insufficient documentation

## 2015-01-05 DIAGNOSIS — E871 Hypo-osmolality and hyponatremia: Secondary | ICD-10-CM | POA: Insufficient documentation

## 2015-01-05 DIAGNOSIS — E1129 Type 2 diabetes mellitus with other diabetic kidney complication: Secondary | ICD-10-CM | POA: Diagnosis present

## 2015-01-05 DIAGNOSIS — G8929 Other chronic pain: Secondary | ICD-10-CM | POA: Insufficient documentation

## 2015-01-05 DIAGNOSIS — G629 Polyneuropathy, unspecified: Secondary | ICD-10-CM

## 2015-01-05 DIAGNOSIS — E1140 Type 2 diabetes mellitus with diabetic neuropathy, unspecified: Secondary | ICD-10-CM | POA: Insufficient documentation

## 2015-01-05 DIAGNOSIS — Z794 Long term (current) use of insulin: Secondary | ICD-10-CM | POA: Insufficient documentation

## 2015-01-05 HISTORY — DX: Type 2 diabetes mellitus with other diabetic kidney complication: E11.29

## 2015-01-05 HISTORY — DX: Obesity, unspecified: E11.69

## 2015-01-05 HISTORY — DX: Type 2 diabetes mellitus with other specified complication: E66.9

## 2015-01-05 HISTORY — DX: Body Mass Index (BMI) 40.0 and over, adult: Z684

## 2015-01-05 HISTORY — DX: Morbid (severe) obesity due to excess calories: E66.01

## 2015-01-05 LAB — CBC WITH DIFFERENTIAL/PLATELET
Basophils Absolute: 0.1 10*3/uL (ref 0–0.1)
Basophils Relative: 1 %
Eosinophils Absolute: 0.2 10*3/uL (ref 0–0.7)
Eosinophils Relative: 2 %
HEMATOCRIT: 36.1 % — AB (ref 40.0–52.0)
Hemoglobin: 12.2 g/dL — ABNORMAL LOW (ref 13.0–18.0)
Lymphocytes Relative: 12 %
Lymphs Abs: 1.5 10*3/uL (ref 1.0–3.6)
MCH: 30.5 pg (ref 26.0–34.0)
MCHC: 33.8 g/dL (ref 32.0–36.0)
MCV: 90.2 fL (ref 80.0–100.0)
MONO ABS: 1.1 10*3/uL — AB (ref 0.2–1.0)
Monocytes Relative: 9 %
NEUTROS ABS: 9.7 10*3/uL — AB (ref 1.4–6.5)
Neutrophils Relative %: 76 %
Platelets: 217 10*3/uL (ref 150–440)
RBC: 4 MIL/uL — AB (ref 4.40–5.90)
RDW: 13.5 % (ref 11.5–14.5)
WBC: 12.7 10*3/uL — AB (ref 3.8–10.6)

## 2015-01-05 LAB — COMPREHENSIVE METABOLIC PANEL
ALT: 15 U/L — ABNORMAL LOW (ref 17–63)
ANION GAP: 7 (ref 5–15)
AST: 23 U/L (ref 15–41)
Albumin: 2.6 g/dL — ABNORMAL LOW (ref 3.5–5.0)
Alkaline Phosphatase: 80 U/L (ref 38–126)
BUN: 25 mg/dL — ABNORMAL HIGH (ref 6–20)
CO2: 22 mmol/L (ref 22–32)
Calcium: 8.2 mg/dL — ABNORMAL LOW (ref 8.9–10.3)
Chloride: 101 mmol/L (ref 101–111)
Creatinine, Ser: 1.43 mg/dL — ABNORMAL HIGH (ref 0.61–1.24)
GFR calc Af Amer: >60 mL/min (ref >60)
GFR calc non Af Amer: 54 mL/min — ABNORMAL LOW (ref >60)
Glucose, Bld: 415 mg/dL — ABNORMAL HIGH (ref 65–99)
POTASSIUM: 3.9 mmol/L (ref 3.5–5.1)
Sodium: 130 mmol/L — ABNORMAL LOW (ref 135–145)
Total Bilirubin: 0.4 mg/dL (ref 0.3–1.2)
Total Protein: 6.8 g/dL (ref 6.5–8.1)

## 2015-01-05 LAB — GLUCOSE, CAPILLARY: GLUCOSE-CAPILLARY: 225 mg/dL — AB (ref 65–99)

## 2015-01-05 LAB — LIPASE, BLOOD: Lipase: 35 U/L (ref 11–51)

## 2015-01-05 MED ORDER — PIPERACILLIN-TAZOBACTAM 3.375 G IVPB
INTRAVENOUS | Status: AC
  Filled 2015-01-05: qty 50

## 2015-01-05 MED ORDER — PIPERACILLIN-TAZOBACTAM 4.5 G IVPB
4.5000 g | Freq: Once | INTRAVENOUS | Status: AC
  Administered 2015-01-05: 4.5 g via INTRAVENOUS
  Filled 2015-01-05 (×2): qty 100

## 2015-01-05 MED ORDER — IOHEXOL 240 MG/ML SOLN
25.0000 mL | Freq: Once | INTRAMUSCULAR | Status: AC | PRN
  Administered 2015-01-05: 25 mL via ORAL

## 2015-01-05 MED ORDER — SODIUM CHLORIDE 0.9 % IV BOLUS (SEPSIS)
1000.0000 mL | Freq: Once | INTRAVENOUS | Status: AC
  Administered 2015-01-05: 1000 mL via INTRAVENOUS

## 2015-01-05 MED ORDER — INSULIN ASPART 100 UNIT/ML ~~LOC~~ SOLN
6.0000 [IU] | Freq: Once | SUBCUTANEOUS | Status: AC
  Administered 2015-01-05: 6 [IU] via INTRAVENOUS
  Filled 2015-01-05: qty 1

## 2015-01-05 MED ORDER — IOHEXOL 300 MG/ML  SOLN
125.0000 mL | Freq: Once | INTRAMUSCULAR | Status: AC | PRN
  Administered 2015-01-05: 125 mL via INTRAVENOUS

## 2015-01-05 MED ORDER — MORPHINE SULFATE (PF) 4 MG/ML IV SOLN
4.0000 mg | Freq: Once | INTRAVENOUS | Status: AC
  Administered 2015-01-05: 4 mg via INTRAVENOUS
  Filled 2015-01-05: qty 1

## 2015-01-05 NOTE — Consult Note (Signed)
PCP:   No PCP Per Patient   Consult requested by: Surgeon  Chief Complaint:  Right Lower portion pain  HPI: This is a 54 year old male who purpura presents with rectal quadrant pain. Pain has been present for approximately a week. During the last 2 days it's gotten significantly worse. Pain is described as sharp and intermittent. At its worst is 10/10, currently 4/10. The patient denies any nausea, vomiting or diarrhea. He denies any fevers or chills. He came because of the worsening discomfort. He has no history of coronary artery disease. Denies any chest pain, congestive history of congestive heart failure. His last surgery was in 2014 when he had his left great toe amputated. He states he tolerated this well with no complications. He does all his ADLs at home and doesn't get short of breath. The patient does smoke but denies wheezes.  Review of Systems:  The patient denies anorexia, fever, weight loss,, vision loss, decreased hearing, hoarseness, chest pain, syncope, dyspnea on exertion, peripheral edema, balance deficits, hemoptysis, abdominal pain, melena, hematochezia, severe indigestion/heartburn, hematuria, incontinence, genital sores, muscle weakness, suspicious skin lesions, transient blindness, difficulty walking, depression, unusual weight change, abnormal bleeding, enlarged lymph nodes, angioedema, and breast masses.  Past Medical History: Past Medical History  Diagnosis Date  . Pancreatitis, acute   . Diabetes mellitus without complication ([place])   . Neuropathy ([place])   . Hypertension   . Hypercholesteremia    Past Surgical History  Procedure Laterality Date  . Amputation    . Cholecystectomy      Medications: Prior to Admission medications   Medication Sig Start Date End Date Taking? Authorizing Provider  oxyCODONE-acetaminophen (ROXICET) 5-325 MG per tablet Take 1 tablet by mouth every 6 (six) hours as needed. 08/12/14   Harvest Dark, MD  traMADol (ULTRAM) 50 MG  tablet Take 1 tablet (50 mg total) by mouth every 6 (six) hours as needed for moderate pain. 08/28/14   Sable Feil, PA-C    Allergies:   Allergies  Allergen Reactions  . Naproxen Rash    Social History:  reports that he has been smoking Cigarettes.  He has been smoking about 0.50 packs per day. He does not have any smokeless tobacco history on file. He reports that he does not drink alcohol or use illicit drugs.  Family History: Diabetes Mellitus  Physical Exam: Filed Vitals:   01/05/15 2130 01/05/15 2200 01/05/15 2244 01/05/15 2300  BP: 160/80 159/76 137/80 144/76  Pulse: 109 114 94 97  Temp:      TempSrc:      Resp:      Height:      Weight:      SpO2: 96% 96% 94% 94%    General:  Alert and oriented times three, well developed and nourished, no acute distress Eyes: PERRLA, pink conjunctiva, no scleral icterus ENT: Moist oral mucosa, neck supple, no thyromegaly Lungs: clear to ascultation, no wheeze, no crackles, no use of accessory muscles Cardiovascular: regular rate and rhythm, no regurgitation, no gallops, no murmurs. No carotid bruits, no JVD Abdomen: soft, positive BS, positive TTP greatest in RLQ, non-distended, no organomegaly, not an acute abdomen GU: not examined Neuro: CN II - XII grossly intact, sensation intact Musculoskeletal: strength 5/5 all extremities, no clubbing, cyanosis or edema Skin: no rash, no subcutaneous crepitation, no decubitus Psych: appropriate patient   Labs on Admission:   Recent Labs  01/05/15 2126  NA 130*  K 3.9  CL 101  CO2 22  GLUCOSE  415*  BUN 25*  CREATININE 1.43*  CALCIUM 8.2*    Recent Labs  01/05/15 2126  AST 23  ALT 15*  ALKPHOS 80  BILITOT 0.4  PROT 6.8  ALBUMIN 2.6*    Recent Labs  01/05/15 2126  LIPASE 35    Recent Labs  01/05/15 2126  WBC 12.7*  NEUTROABS 9.7*  HGB 12.2*  HCT 36.1*  MCV 90.2  PLT 217   No results for input(s): CKTOTAL, CKMB, CKMBINDEX, TROPONINI in the last 72  hours. Invalid input(s): POCBNP No results for input(s): DDIMER in the last 72 hours. No results for input(s): HGBA1C in the last 72 hours. No results for input(s): CHOL, HDL, LDLCALC, TRIG, CHOLHDL, LDLDIRECT in the last 72 hours. No results for input(s): TSH, T4TOTAL, T3FREE, THYROIDAB in the last 72 hours.  Invalid input(s): FREET3 No results for input(s): VITAMINB12, FOLATE, FERRITIN, TIBC, IRON, RETICCTPCT in the last 72 hours.  Micro Results: No results found for this or any previous visit (from the past 240 hour(s)).   Radiological Exams on Admission: Ct Abdomen Pelvis W Contrast  01/05/2015  CLINICAL DATA:  Acute onset of right lower quadrant abdominal pain. Initial encounter. EXAM: CT ABDOMEN AND PELVIS WITH CONTRAST TECHNIQUE: Multidetector CT imaging of the abdomen and pelvis was performed using the standard protocol following bolus administration of intravenous contrast. CONTRAST:  142mL OMNIPAQUE IOHEXOL 300 MG/ML  SOLN COMPARISON:  CT of the abdomen and pelvis from 04/11/2014 FINDINGS: The visualized lung bases are clear. The liver and spleen are unremarkable in appearance. The patient is status post cholecystectomy, with clips noted at the gallbladder fossa. The pancreas and adrenal glands are unremarkable. The kidneys are unremarkable in appearance. There is no evidence of hydronephrosis. No renal or ureteral stones are seen. Nonspecific perinephric stranding is noted bilaterally. No free fluid is identified. The small bowel is unremarkable in appearance. The stomach is within normal limits. No acute vascular abnormalities are seen. Scattered calcification is noted along the abdominal aorta and its branches. The appendix is dilated to 2.4 cm in diameter, with a region of phlegmon noted at the right lower quadrant. There is no definite evidence of perforation, though the lumen is difficult to fully assess. Surrounding soft tissue inflammation and trace fluid are seen. There is no  evidence of abscess formation at this time. The colon is unremarkable in appearance. The bladder is mildly distended and grossly unremarkable. The prostate remains normal in size. No inguinal lymphadenopathy is seen. No acute osseous abnormalities are identified. Vacuum phenomenon is noted at L3-L4 and L4-L5, and minimally along the lower thoracic spine. IMPRESSION: 1. Region of phlegmon noted at the right lower quadrant, with dilatation of the appendix to 2.4 cm in diameter, compatible with acute appendicitis. No definite evidence of perforation, though the lumen is difficult to fully assess. Surrounding soft tissue inflammation and trace fluid noted. No evidence of abscess formation at this time. 2. Scattered calcification along the abdominal aorta and its branches. These results were called by telephone at the time of interpretation on 01/05/2015 at 10:50 pm to Dr. Charlotte Crumb, who verbally acknowledged these results. Electronically Signed   By: Garald Balding M.D.   On: 01/05/2015 22:51    Assessment/Plan Present on Admission:  . DM (diabetes mellitus) type II controlled with renal manifestation ([place]) -Patient is NPO, SSI ordered every 6 hours -Hemoglobin A1c in the a.m. -Home medications of metformin resumed . BP (high blood pressure)  -Home medications lisinopril also resumed  -When necessary  blood pressure medications ordered . Compulsive tobacco user syndrome -Nicotine patch, doing nebs as needed  . Morbid obesity ([place]) . Hypercholesteremia -Stable, home medications not currently resumed . Acute appendicitis with localized peritonitis -Per surgeon Miscellaneous -Consult social worker, patient has no  insurance.   Merritt Mccravy 01/05/2015, 11:49 PM

## 2015-01-05 NOTE — ED Notes (Signed)
Verbal report given to Montine Circle, Therapist, sports

## 2015-01-05 NOTE — ED Provider Notes (Addendum)
[facility] Emergency Department Provider Note  ____________________________________________   I have reviewed the triage vital signs and the nursing notes.   HISTORY  Chief Complaint Abdominal Pain    HPI Brian [name] is a 54 y.o. male with a history cholecystectomy in the past, pancreatitis in the past chronic pain, neuropathy, morbid obesity hypertension noncompliancepresents today complaining of lower abdominal pain. Patient has had right-sided lower abdominal pain for about a week. He has had not had any fevers or chills or vomiting or diarrhea. No testicular pain or scrotal pain. The pain was gradual in onset but got worse today. He has not been compliant with any of his medication. He would like some pain medication. He has had some anorexia today.  Past Medical History  Diagnosis Date  . Pancreatitis, acute   . Diabetes mellitus without complication ([place])   . Neuropathy ([place])   . Hypertension   . Hypercholesteremia     Patient Active Problem List   Diagnosis Date Noted  . Pancreatitis, acute   . Neuropathy ([place])   . Hypercholesteremia     Past Surgical History  Procedure Laterality Date  . Amputation    . Cholecystectomy      Current Outpatient Rx  Name  Route  Sig  Dispense  Refill  . oxyCODONE-acetaminophen (ROXICET) 5-325 MG per tablet   Oral   Take 1 tablet by mouth every 6 (six) hours as needed.   25 tablet   0   . traMADol (ULTRAM) 50 MG tablet   Oral   Take 1 tablet (50 mg total) by mouth every 6 (six) hours as needed for moderate pain.   12 tablet   0     Allergies Naproxen  History reviewed. No pertinent family history.  Social History Social History  Substance Use Topics  . Smoking status: Current Every Day Smoker -- 0.50 packs/day    Types: Cigarettes  . Smokeless tobacco: None  . Alcohol Use: No    Review of Systems Constitutional: No fever/chills Eyes: No visual changes. ENT: No sore throat. No  stiff neck no neck pain Cardiovascular: Denies chest pain. Respiratory: Denies shortness of breath. Gastrointestinal:   no vomiting.  No diarrhea.  No constipation. Genitourinary: Negative for dysuria. Musculoskeletal: Negative lower extremity swelling Skin: Negative for rash. Neurological: Negative for headaches, focal weakness or numbness. 10-point ROS otherwise negative.  ____________________________________________   PHYSICAL EXAM:  VITAL SIGNS: ED Triage Vitals  Enc Vitals Group     BP 01/05/15 2103 178/97 mmHg     Pulse Rate 01/05/15 2103 117     Resp 01/05/15 2103 18     Temp 01/05/15 2103 100 F (37.8 C)     Temp Source 01/05/15 2103 Oral     SpO2 01/05/15 2103 97 %     Weight 01/05/15 2103 290 lb (131.543 kg)     Height 01/05/15 2103 5\' 10"  (1.778 m)     Head Cir --      Peak Flow --      Pain Score 01/05/15 2103 10     Pain Loc --      Pain Edu? --      Excl. in [place]? --     Constitutional: Alert and oriented. Well appearing and in no acute distress. Eyes: Conjunctivae are normal. PERRL. EOMI. Head: Atraumatic. Nose: No congestion/rhinnorhea. Mouth/Throat: Mucous membranes are moist.  Oropharynx non-erythematous. Neck: No stridor.   Nontender with no meningismus Cardiovascular: Normal rate, regular rhythm. Grossly  normal heart sounds.  Good peripheral circulation. Respiratory: Normal respiratory effort.  No retractions. Lungs CTAB. Abdominal: Significant morbid obesity noted, there is right lower quadrant focal tenderness noted, no obvious herniation no testicular mass or swelling noted positive voluntary mild guarding no involuntary guarding or rebound morbid obesity limits the exam Back:  There is no focal tenderness or step off there is no midline tenderness there are no lesions noted. there is no CVA tenderness Musculoskeletal: No lower extremity tenderness. No joint effusions, no DVT signs strong distal pulses no edema Neurologic:  Normal speech and  language. No gross focal neurologic deficits are appreciated.  Skin:  Skin is warm, dry and intact. No rash noted. Psychiatric: Mood and affect are normal. Speech and behavior are normal.  ____________________________________________   LABS (all labs ordered are listed, but only abnormal results are displayed)  Labs Reviewed  CBC WITH DIFFERENTIAL/PLATELET - Abnormal; Notable for the following:    WBC 12.7 (*)    RBC 4.00 (*)    Hemoglobin 12.2 (*)    HCT 36.1 (*)    Neutro Abs 9.7 (*)    Monocytes Absolute 1.1 (*)    All other components within normal limits  COMPREHENSIVE METABOLIC PANEL - Abnormal; Notable for the following:    Sodium 130 (*)    Glucose, Bld 415 (*)    BUN 25 (*)    Creatinine, Ser 1.43 (*)    Calcium 8.2 (*)    Albumin 2.6 (*)    ALT 15 (*)    GFR calc non Af Amer 54 (*)    All other components within normal limits  LIPASE, BLOOD   ____________________________________________  EKG  I personally interpreted any EKGs ordered by me or triage  ____________________________________________  RADIOLOGY  I reviewed any imaging ordered by me or triage that were performed during my shift ____________________________________________   PROCEDURES  Procedure(s) performed: None  Critical Care performed: None  ____________________________________________   INITIAL IMPRESSION / ASSESSMENT AND PLAN / ED COURSE  Pertinent labs & imaging results that were available during my care of the patient were reviewed by me and considered in my medical decision making (see chart for details).  Patient presents with right lower quadrant pain getting gradually worse for one week. Diverticulitis is certainly a possibility as well as less likely is appendicitis. Patient elevated blood glucose as noted, patient has been noncompliant with his diabetic vacation for 3 months. We will give him IV fluid and reassessed. Obtain a CT scan to evaluate his abdominal  pain. ____________________________________________   FINAL CLINICAL IMPRESSION(S) / ED DIAGNOSES  Final diagnoses:  None     Schuyler Amor, MD 01/05/15 [place], MD 01/05/15 [phone]

## 2015-01-05 NOTE — ED Notes (Signed)
Pt and Morphine were scanned, did not complete accept.

## 2015-01-05 NOTE — H&P (Signed)
Brian [name] is an 54 y.o. male.   Chief Complaint: abdominal pain HPI: 54yrold male morbidly obese with multiple medical issues including uncontrolled DM type II, hypertension, hyperlipidemia; he has not had his medications for about 2 months due to loosing his job.  He states that about 1 week ago he began having abdominal pain more on the right side.  He states the pain continued to gradually worsen without radiating to other areas, states became very bad over the past 2 days.  He states that he did not have much of an appetite but has still been eating some, last meal was at 8pm tonight.  He denies any fever, chills, nausea, vomiting, chest pain, sob, diarrhea, constipation, blood in stool, melena, dysuria, hematuria, urgency or frequency.  He [facility], he reports that they found some non-cancerous polyps and told him to return in 5 years.  He denies any personal or family history of irritable bowel, crohn's, ulcerative colitis or colon/bowel cancers.    Past Medical History  Diagnosis Date  . Pancreatitis, acute   . Ne[place] ([place]   . Hypertension   . Hypercholesteremia   . DM (diabetes mellitus) type II controlled with renal manifestation ([place]   . Diabetes mellitus type 2 in obese ([place]   . Morbid obesity with BMI of 45.0-49.9, adult ([facility]     Past Surgical History  Procedure Laterality Date  . Amputation    . Cholecystectomy  1998    Family History  Problem Relation Age of Onset  . Hypertension Mother   . Hyperlipidemia Mother   . Heart disease Father   . Heart disease Maternal Grandfather    Social History:  reports that he has been smoking Cigarettes.  He has been smoking about 0.50 packs per day. He has never used smokeless tobacco. He reports that he does not drink alcohol or use illicit drugs.  Allergies:  Allergies  Allergen Reactions  . Naproxen Rash     (Not in a hospital admission)  Results for  orders placed or performed during the hospital encounter of 01/05/15 (from the past 48 hour(s))  CBC with Differential     Status: Abnormal   Collection Time: 01/05/15  9:26 PM  Result Value Ref Range   WBC 12.7 (H) 3.8 - 10.6 K/uL   RBC 4.00 (L) 4.40 - 5.90 MIL/uL   Hemoglobin 12.2 (L) 13.0 - 18.0 g/dL   HCT 36.1 (L) 40.0 - 52.0 %   MCV 90.2 80.0 - 100.0 fL   MCH 30.5 26.0 - 34.0 pg   MCHC 33.8 32.0 - 36.0 g/dL   RDW 13.5 11.5 - 14.5 %   Platelets 217 150 - 440 K/uL   Neutrophils Relative % 76 %   Neutro Abs 9.7 (H) 1.4 - 6.5 K/uL   Lymphocytes Relative 12 %   Lymphs Abs 1.5 1.0 - 3.6 K/uL   Monocytes Relative 9 %   Monocytes Absolute 1.1 (H) 0.2 - 1.0 K/uL   Eosinophils Relative 2 %   Eosinophils Absolute 0.2 0 - 0.7 K/uL   Basophils Relative 1 %   Basophils Absolute 0.1 0 - 0.1 K/uL  Comprehensive metabolic panel     Status: Abnormal   Collection Time: 01/05/15  9:26 PM  Result Value Ref Range   Sodium 130 (L) 135 - 145 mmol/L   Potassium 3.9 3.5 - 5.1 mmol/L   Chloride 101 101 - 111 mmol/L  CO2 22 22 - 32 mmol/L   Glucose, Bld 415 (H) 65 - 99 mg/dL   BUN 25 (H) 6 - 20 mg/dL   Creatinine, Ser 1.43 (H) 0.61 - 1.24 mg/dL   Calcium 8.2 (L) 8.9 - 10.3 mg/dL   Total Protein 6.8 6.5 - 8.1 g/dL   Albumin 2.6 (L) 3.5 - 5.0 g/dL   AST 23 15 - 41 U/L   ALT 15 (L) 17 - 63 U/L   Alkaline Phosphatase 80 38 - 126 U/L   Total Bilirubin 0.4 0.3 - 1.2 mg/dL   GFR calc non Af Amer 54 (L) >60 mL/min   GFR calc Af Amer >60 >60 mL/min    Comment: (NOTE) The eGFR has been calculated using the CKD EPI equation. This calculation has not been validated in all clinical situations. eGFR's persistently <60 mL/min signify possible Chronic Kidney Disease.    Anion gap 7 5 - 15  Lipase, blood     Status: None   Collection Time: 01/05/15  9:26 PM  Result Value Ref Range   Lipase 35 11 - 51 U/L   Ct Abdomen Pelvis W Contrast  01/05/2015  CLINICAL DATA:  Acute onset of right lower quadrant  abdominal pain. Initial encounter. EXAM: CT ABDOMEN AND PELVIS WITH CONTRAST TECHNIQUE: Multidetector CT imaging of the abdomen and pelvis was performed using the standard protocol following bolus administration of intravenous contrast. CONTRAST:  188m OMNIPAQUE IOHEXOL 300 MG/ML  SOLN COMPARISON:  CT of the abdomen and pelvis from 04/11/2014 FINDINGS: The visualized lung bases are clear. The liver and spleen are unremarkable in appearance. The patient is status post cholecystectomy, with clips noted at the gallbladder fossa. The pancreas and adrenal glands are unremarkable. The kidneys are unremarkable in appearance. There is no evidence of hydronephrosis. No renal or ureteral stones are seen. Nonspecific perinephric stranding is noted bilaterally. No free fluid is identified. The small bowel is unremarkable in appearance. The stomach is within normal limits. No acute vascular abnormalities are seen. Scattered calcification is noted along the abdominal aorta and its branches. The appendix is dilated to 2.4 cm in diameter, with a region of phlegmon noted at the right lower quadrant. There is no definite evidence of perforation, though the lumen is difficult to fully assess. Surrounding soft tissue inflammation and trace fluid are seen. There is no evidence of abscess formation at this time. The colon is unremarkable in appearance. The bladder is mildly distended and grossly unremarkable. The prostate remains normal in size. No inguinal lymphadenopathy is seen. No acute osseous abnormalities are identified. Vacuum phenomenon is noted at L3-L4 and L4-L5, and minimally along the lower thoracic spine. IMPRESSION: 1. Region of phlegmon noted at the right lower quadrant, with dilatation of the appendix to 2.4 cm in diameter, compatible with acute appendicitis. No definite evidence of perforation, though the lumen is difficult to fully assess. Surrounding soft tissue inflammation and trace fluid noted. No evidence of  abscess formation at this time. 2. Scattered calcification along the abdominal aorta and its branches. These results were called by telephone at the time of interpretation on 01/05/2015 at 10:50 pm to Dr. JCharlotte Crumb who verbally acknowledged these results. Electronically Signed   By: JGarald BaldingM.D.   On: 01/05/2015 22:51    Review of Systems  Constitutional: Negative for fever, chills, weight loss, malaise/fatigue and diaphoresis.  HENT: Negative for congestion.   Respiratory: Negative for cough and shortness of breath.   Cardiovascular: Negative for chest pain, palpitations  and leg swelling.  Gastrointestinal: Positive for abdominal pain. Negative for heartburn, nausea, vomiting, diarrhea, constipation, blood in stool and melena.  Genitourinary: Negative for dysuria, urgency, frequency, hematuria and flank pain.  Musculoskeletal: Positive for back pain. Negative for myalgias and joint pain.  Skin: Negative for itching and rash.  Neurological: Negative for dizziness, focal weakness, loss of consciousness, weakness and headaches.  Psychiatric/Behavioral: The patient is not nervous/anxious.   All other systems reviewed and are negative.   Blood pressure 144/76, pulse 97, temperature 100 F (37.8 C), temperature source Oral, resp. rate 18, height 5' 10" (1.778 m), weight 290 lb (131.543 kg), SpO2 94 %. Physical Exam  Vitals reviewed. Constitutional: He is oriented to person, place, and time. He appears well-developed and well-nourished.  Obese   HENT:  Head: Normocephalic and atraumatic.  Right Ear: External ear normal.  Left Ear: External ear normal.  Nose: Nose normal.  Mouth/Throat: Oropharynx is clear and moist. No oropharyngeal exudate.  Eyes: Conjunctivae and EOM are normal. Pupils are equal, round, and reactive to light. No scleral icterus.  Neck: Normal range of motion. Neck supple. No tracheal deviation present.  Cardiovascular: Normal rate, regular rhythm, normal heart  sounds and intact distal pulses.  Exam reveals no gallop and no friction rub.   No murmur heard. Respiratory: Effort normal and breath sounds normal. No respiratory distress. He has no wheezes. He has no rales.  GI: Soft. Bowel sounds are normal. There is tenderness. There is no rebound and no guarding.  Morbidly obese abdomen, tender in RLQ only, umbilical hernia, easily reduced, negative psoas, obturator and Rosving's sign.  No CVA tenderness  Musculoskeletal: Normal range of motion. He exhibits edema. He exhibits no tenderness.  1+ pitting edema bilateral ankles  Neurological: He is alert and oriented to person, place, and time. No cranial nerve deficit.  Skin: Skin is warm and dry. No rash noted. No erythema. No pallor.  Psychiatric: He has a normal mood and affect. His behavior is normal. Judgment and thought content normal.     Assessment/Plan 54 yr old obese male with acute appendicitis.  I personally reviewed his medical history as above and asked that internal medicine help manage his uncontrolled diabetes and hypertension.  I personally reviewed his laboratory data showing elevated WBC, hyponatremia likely related to hyperglycemia, and slightly elevated creatinine.  I personally reviewed the CT scan images showing inflammation and stranding around the cecum and just lateral without a defined lumen to the appendix.  I also reviewed the CT scan report as noted above.    The risks, benefits, complications, treatment options, and expected outcomes were discussed with the patient. The treatment of antibiotics alone was discussed giving a 20% chance that this could fail and surgery would be necessary.  Also discussed continuing to the operating room for Laparoscopic Appendectomy.  The possibilities of  bleeding, recurrent infection, perforation of viscus, finding a normal appendix, the need for additional procedures, failure to diagnose a condition, conversion to open procedure and creating a  complication requiring transfusion or further operations were discussed. The patient was given the opportunity to ask questions and have them answered.  Patient would like to proceed with Laparoscopic Appendectomy and consent was obtained. I will place him on the schedule for my partner Dr. Marina Gravel.    Lavona Mound Loflin 01/05/2015, 11:52 PM

## 2015-01-05 NOTE — ED Notes (Addendum)
Pt c/o "mild" intermittent right lower quadrant pain for a week; more constant in the last few day, especially today; denies urinary s/s; denies N/V/D; last normal BM this afternoon; denies fever; pt says he's been out of all prescribed medications since the end of August-meds for HTN, high cholesterol and chronic back pain

## 2015-01-05 NOTE — ED Notes (Signed)
Patient transported to CT 

## 2015-01-05 NOTE — Progress Notes (Signed)
ANTIBIOTIC CONSULT NOTE - INITIAL  Pharmacy Consult for Zosyn Indication: pneumonia  Allergies  Allergen Reactions  . Naproxen Rash    Patient Measurements: Height: 5\' 10"  (177.8 cm) Weight: 290 lb (131.543 kg) IBW/kg (Calculated) : 73 Adjusted Body Weight: 96.2 kg  Vital Signs: Temp: 100 F (37.8 C) (11/13 2103) Temp Source: Oral (11/13 2103) BP: 144/76 mmHg (11/13 2300) Pulse Rate: 97 (11/13 2300) Intake/Output from previous day:   Intake/Output from this shift:    Labs:  Recent Labs  01/05/15 2126  WBC 12.7*  HGB 12.2*  PLT 217  CREATININE 1.43*   Estimated Creatinine Clearance: 79.6 mL/min (by C-G formula based on Cr of 1.43). No results for input(s): VANCOTROUGH, VANCOPEAK, VANCORANDOM, GENTTROUGH, GENTPEAK, GENTRANDOM, TOBRATROUGH, TOBRAPEAK, TOBRARND, AMIKACINPEAK, AMIKACINTROU, AMIKACIN in the last 72 hours.   Microbiology: No results found for this or any previous visit (from the past 720 hour(s)).  Medical History: Past Medical History  Diagnosis Date  . Pancreatitis, acute   . Diabetes mellitus without complication ([place])   . Neuropathy ([place])   . Hypertension   . Hypercholesteremia     Medications:  Infusions:  . [START ON 01/06/2015] piperacillin-tazobactam (ZOSYN)  IV     Assessment: 75 yom cc abdominal pain with history of cholecystectomy, pancreatitis, morbid obesity. RLQ pain gradually getting worse, plan CT.  Goal of Therapy:    Plan:  Zosyn 4.5 gm IV Q8H EI (BMI > 40). Will continue to follow.  Laural Benes, Pharm.D.  Clinical Pharmacist 01/05/2015,11:09 PM

## 2015-01-06 ENCOUNTER — Observation Stay: Payer: Self-pay | Admitting: Anesthesiology

## 2015-01-06 ENCOUNTER — Observation Stay: Payer: MEDICAID | Admitting: Anesthesiology

## 2015-01-06 ENCOUNTER — Encounter
Admission: EM | Disposition: A | Discharge status: Home / Self Care | Payer: Self-pay | Source: Home / Self Care | Attending: Emergency Medicine

## 2015-01-06 DIAGNOSIS — K353 Acute appendicitis with localized peritonitis: Secondary | ICD-10-CM

## 2015-01-06 HISTORY — PX: LAPAROSCOPIC APPENDECTOMY: SHX408

## 2015-01-06 LAB — GLUCOSE, CAPILLARY
GLUCOSE-CAPILLARY: 260 mg/dL — AB (ref 65–99)
GLUCOSE-CAPILLARY: 197 mg/dL — AB (ref 65–99)
Glucose-Capillary: 239 mg/dL — ABNORMAL HIGH (ref 65–99)
Glucose-Capillary: 239 mg/dL — ABNORMAL HIGH (ref 65–99)

## 2015-01-06 LAB — BASIC METABOLIC PANEL
Anion gap: 7 (ref 5–15)
BUN: 24 mg/dL — AB (ref 6–20)
CALCIUM: 8 mg/dL — AB (ref 8.9–10.3)
CO2: 24 mmol/L (ref 22–32)
CREATININE: 1.38 mg/dL — AB (ref 0.61–1.24)
Chloride: 104 mmol/L (ref 101–111)
GFR calc Af Amer: >60 mL/min (ref >60)
GFR, EST NON AFRICAN AMERICAN: 56 mL/min — AB (ref >60)
GLUCOSE: 262 mg/dL — AB (ref 65–99)
POTASSIUM: 3.9 mmol/L (ref 3.5–5.1)
SODIUM: 135 mmol/L (ref 135–145)

## 2015-01-06 LAB — MRSA PCR SCREENING: MRSA by PCR: NEGATIVE

## 2015-01-06 LAB — CBC
HEMATOCRIT: 36.7 % — AB (ref 40.0–52.0)
Hemoglobin: 12.3 g/dL — ABNORMAL LOW (ref 13.0–18.0)
MCH: 30.3 pg (ref 26.0–34.0)
MCHC: 33.5 g/dL (ref 32.0–36.0)
MCV: 90.4 fL (ref 80.0–100.0)
PLATELETS: 179 10*3/uL (ref 150–440)
RBC: 4.06 MIL/uL — ABNORMAL LOW (ref 4.40–5.90)
RDW: 13.4 % (ref 11.5–14.5)
WBC: 9.5 10*3/uL (ref 3.8–10.6)

## 2015-01-06 LAB — HEMOGLOBIN A1C: HEMOGLOBIN A1C: 11.7 % — AB (ref 4.0–6.0)

## 2015-01-06 SURGERY — APPENDECTOMY, LAPAROSCOPIC
Anesthesia: General | Wound class: Clean Contaminated

## 2015-01-06 MED ORDER — LACTATED RINGERS IV SOLN
INTRAVENOUS | Status: DC | PRN
  Administered 2015-01-06: 14:00:00 via INTRAVENOUS

## 2015-01-06 MED ORDER — IPRATROPIUM-ALBUTEROL 0.5-2.5 (3) MG/3ML IN SOLN: 3.0000 mL | RESPIRATORY_TRACT | Status: DC | PRN

## 2015-01-06 MED ORDER — ENOXAPARIN SODIUM 40 MG/0.4ML ~~LOC~~ SOLN
40.0000 mg | Freq: Two times a day (BID) | SUBCUTANEOUS | Status: DC
  Administered 2015-01-06 – 2015-01-08 (×6): 40 mg via SUBCUTANEOUS
  Filled 2015-01-06 (×7): qty 0.4

## 2015-01-06 MED ORDER — PANTOPRAZOLE SODIUM 40 MG IV SOLR
40.0000 mg | Freq: Every day | INTRAVENOUS | Status: DC
  Administered 2015-01-06: 40 mg via INTRAVENOUS
  Filled 2015-01-06: qty 40

## 2015-01-06 MED ORDER — LISINOPRIL 20 MG PO TABS
10.0000 mg | ORAL_TABLET | Freq: Every day | ORAL | Status: DC
  Administered 2015-01-06 – 2015-01-08 (×3): 10 mg via ORAL
  Filled 2015-01-06 (×3): qty 1

## 2015-01-06 MED ORDER — POTASSIUM CHLORIDE IN NACL 20-0.9 MEQ/L-% IV SOLN
INTRAVENOUS | Status: DC
  Administered 2015-01-06 – 2015-01-07 (×5): via INTRAVENOUS
  Filled 2015-01-06 (×8): qty 1000

## 2015-01-06 MED ORDER — METOPROLOL TARTRATE 1 MG/ML IV SOLN: 5.0000 mg | Freq: Four times a day (QID) | INTRAVENOUS | Status: DC | PRN

## 2015-01-06 MED ORDER — ONDANSETRON HCL 4 MG/2ML IJ SOLN
4.0000 mg | Freq: Four times a day (QID) | INTRAMUSCULAR | Status: DC | PRN
  Administered 2015-01-06: 4 mg via INTRAVENOUS
  Filled 2015-01-06: qty 2

## 2015-01-06 MED ORDER — MORPHINE SULFATE (PF) 2 MG/ML IV SOLN
2.0000 mg | INTRAVENOUS | Status: DC | PRN
  Administered 2015-01-06 (×3): 2 mg via INTRAVENOUS
  Filled 2015-01-06 (×3): qty 1

## 2015-01-06 MED ORDER — MORPHINE SULFATE (PF) 2 MG/ML IV SOLN
2.0000 mg | INTRAVENOUS | Status: DC | PRN
  Administered 2015-01-06 – 2015-01-08 (×6): 2 mg via INTRAVENOUS
  Filled 2015-01-06 (×6): qty 1

## 2015-01-06 MED ORDER — NICOTINE 14 MG/24HR TD PT24
14.0000 mg | MEDICATED_PATCH | Freq: Every day | TRANSDERMAL | Status: DC
  Administered 2015-01-06 – 2015-01-08 (×3): 14 mg via TRANSDERMAL
  Filled 2015-01-06 (×3): qty 1

## 2015-01-06 MED ORDER — FENTANYL CITRATE (PF) 100 MCG/2ML IJ SOLN
25.0000 ug | INTRAMUSCULAR | Status: DC | PRN
  Administered 2015-01-06 (×4): 25 ug via INTRAVENOUS
  Filled 2015-01-06: qty 0.5

## 2015-01-06 MED ORDER — PIPERACILLIN-TAZOBACTAM 3.375 G IVPB: 3.3750 g | Freq: Three times a day (TID) | INTRAVENOUS | Status: DC

## 2015-01-06 MED ORDER — PROPOFOL 10 MG/ML IV BOLUS
INTRAVENOUS | Status: DC | PRN
  Administered 2015-01-06: 200 mg via INTRAVENOUS

## 2015-01-06 MED ORDER — OXYCODONE HCL 5 MG PO TABS
5.0000 mg | ORAL_TABLET | ORAL | Status: DC | PRN
  Administered 2015-01-06 – 2015-01-08 (×3): 5 mg via ORAL
  Filled 2015-01-06 (×3): qty 1

## 2015-01-06 MED ORDER — MIDAZOLAM HCL 2 MG/2ML IJ SOLN
INTRAMUSCULAR | Status: DC | PRN
  Administered 2015-01-06: 2 mg via INTRAVENOUS

## 2015-01-06 MED ORDER — HEMOSTATIC AGENTS (NO CHARGE) OPTIME
TOPICAL | Status: DC | PRN
  Administered 2015-01-06: 1 via TOPICAL

## 2015-01-06 MED ORDER — PIPERACILLIN-TAZOBACTAM 4.5 G IVPB
4.5000 g | Freq: Three times a day (TID) | INTRAVENOUS | Status: DC
  Administered 2015-01-06 – 2015-01-08 (×8): 4.5 g via INTRAVENOUS
  Filled 2015-01-06 (×10): qty 100

## 2015-01-06 MED ORDER — ACETAMINOPHEN 500 MG PO TABS
1000.0000 mg | ORAL_TABLET | Freq: Four times a day (QID) | ORAL | Status: DC
  Administered 2015-01-06 – 2015-01-08 (×8): 1000 mg via ORAL
  Filled 2015-01-06 (×8): qty 2

## 2015-01-06 MED ORDER — PHENYLEPHRINE HCL 10 MG/ML IJ SOLN
INTRAMUSCULAR | Status: DC | PRN
  Administered 2015-01-06: 100 ug via INTRAVENOUS

## 2015-01-06 MED ORDER — DIPHENHYDRAMINE HCL 50 MG/ML IJ SOLN: 12.5000 mg | Freq: Four times a day (QID) | INTRAMUSCULAR | Status: DC | PRN

## 2015-01-06 MED ORDER — LIDOCAINE HCL (CARDIAC) 20 MG/ML IV SOLN
INTRAVENOUS | Status: DC | PRN
  Administered 2015-01-06: 50 mg via INTRAVENOUS

## 2015-01-06 MED ORDER — HYDROMORPHONE HCL 1 MG/ML IJ SOLN
0.2500 mg | INTRAMUSCULAR | Status: DC | PRN
  Administered 2015-01-06 (×4): 0.25 mg via INTRAVENOUS

## 2015-01-06 MED ORDER — ACETAMINOPHEN 10 MG/ML IV SOLN
INTRAVENOUS | Status: DC | PRN
  Administered 2015-01-06: 1000 mg via INTRAVENOUS

## 2015-01-06 MED ORDER — INSULIN ASPART 100 UNIT/ML ~~LOC~~ SOLN: 0.0000 [IU] | Freq: Every day | SUBCUTANEOUS | Status: DC

## 2015-01-06 MED ORDER — ONDANSETRON HCL 4 MG/2ML IJ SOLN: 4.0000 mg | Freq: Once | INTRAMUSCULAR | Status: DC | PRN

## 2015-01-06 MED ORDER — SUCCINYLCHOLINE CHLORIDE 20 MG/ML IJ SOLN
INTRAMUSCULAR | Status: DC | PRN
  Administered 2015-01-06: 100 mg via INTRAVENOUS

## 2015-01-06 MED ORDER — ONDANSETRON HCL 4 MG/2ML IJ SOLN
INTRAMUSCULAR | Status: DC | PRN
  Administered 2015-01-06: 4 mg via INTRAVENOUS

## 2015-01-06 MED ORDER — INFLUENZA VAC SPLIT QUAD 0.5 ML IM SUSY
0.5000 mL | PREFILLED_SYRINGE | INTRAMUSCULAR | Status: AC
  Administered 2015-01-07: 0.5 mL via INTRAMUSCULAR
  Filled 2015-01-06: qty 0.5

## 2015-01-06 MED ORDER — HYDRALAZINE HCL 20 MG/ML IJ SOLN: 10.0000 mg | INTRAMUSCULAR | Status: DC | PRN

## 2015-01-06 MED ORDER — INSULIN ASPART 100 UNIT/ML ~~LOC~~ SOLN
0.0000 [IU] | Freq: Four times a day (QID) | SUBCUTANEOUS | Status: DC
  Administered 2015-01-06: 8 [IU] via SUBCUTANEOUS
  Administered 2015-01-06 (×2): 5 [IU] via SUBCUTANEOUS
  Administered 2015-01-06: 3 [IU] via SUBCUTANEOUS
  Administered 2015-01-07: 5 [IU] via SUBCUTANEOUS
  Filled 2015-01-06: qty 8
  Filled 2015-01-06: qty 5
  Filled 2015-01-06: qty 3
  Filled 2015-01-06 (×2): qty 5

## 2015-01-06 MED ORDER — ROCURONIUM BROMIDE 100 MG/10ML IV SOLN
INTRAVENOUS | Status: DC | PRN
  Administered 2015-01-06 (×2): 20 mg via INTRAVENOUS
  Administered 2015-01-06 (×2): 10 mg via INTRAVENOUS

## 2015-01-06 MED ORDER — FENTANYL CITRATE (PF) 100 MCG/2ML IJ SOLN
INTRAMUSCULAR | Status: DC | PRN
  Administered 2015-01-06 (×5): 50 ug via INTRAVENOUS

## 2015-01-06 MED ORDER — ALBUTEROL SULFATE HFA 108 (90 BASE) MCG/ACT IN AERS
INHALATION_SPRAY | RESPIRATORY_TRACT | Status: DC | PRN
Start: 2015-01-06 — End: 2015-01-06
  Administered 2015-01-06 (×2): 8 via RESPIRATORY_TRACT

## 2015-01-06 MED ORDER — SUGAMMADEX SODIUM 200 MG/2ML IV SOLN
INTRAVENOUS | Status: DC | PRN
  Administered 2015-01-06: 300 mg via INTRAVENOUS

## 2015-01-06 MED ORDER — BUPIVACAINE HCL 0.25 % IJ SOLN
INTRAMUSCULAR | Status: DC | PRN
Start: 2015-01-06 — End: 2015-01-06
  Administered 2015-01-06: 20 mL
  Administered 2015-01-06: 10 mL

## 2015-01-06 MED ORDER — MORPHINE SULFATE (PF) 2 MG/ML IV SOLN: 2.0000 mg | INTRAVENOUS | Status: DC | PRN

## 2015-01-06 MED ORDER — DIPHENHYDRAMINE HCL 12.5 MG/5ML PO ELIX: 12.5000 mg | ORAL_SOLUTION | Freq: Four times a day (QID) | ORAL | Status: DC | PRN

## 2015-01-06 SURGICAL SUPPLY — 46 items
APPLICATOR SURGIFLO ENDO (HEMOSTASIS) ×3 IMPLANT
BULB RESERV EVAC DRAIN JP 100C (MISCELLANEOUS) ×3 IMPLANT
CANISTER SUCT 1200ML W/VALVE (MISCELLANEOUS) ×3 IMPLANT
CHLORAPREP W/TINT 26ML (MISCELLANEOUS) ×3 IMPLANT
CLOSURE WOUND 1/2 X4 (GAUZE/BANDAGES/DRESSINGS) ×1
CUTTER LINEAR ENDO 35 ART FLEX (STAPLE) ×3 IMPLANT
CUTTER LINEAR ENDO 35 ETS (STAPLE) ×3 IMPLANT
DEFOGGER SCOPE WARMER CLEARIFY (MISCELLANEOUS) ×6 IMPLANT
DRAIN CHANNEL JP 19F (MISCELLANEOUS) ×3 IMPLANT
DRAPE SHEET LG 3/4 BI-LAMINATE (DRAPES) ×3 IMPLANT
DRAPE UTILITY 15X26 TOWEL STRL (DRAPES) ×6 IMPLANT
DRESSING TELFA 4X3 1S ST N-ADH (GAUZE/BANDAGES/DRESSINGS) ×3 IMPLANT
DRSG TEGADERM 2-3/8X2-3/4 SM (GAUZE/BANDAGES/DRESSINGS) ×9 IMPLANT
ENDOPOUCH RETRIEVER 10 (MISCELLANEOUS) ×3 IMPLANT
GAUZE SPONGE 4X4 12PLY STRL (GAUZE/BANDAGES/DRESSINGS) ×3 IMPLANT
GLOVE BIO SURGEON STRL SZ7.5 (GLOVE) ×3 IMPLANT
GOWN STRL REUS W/ TWL LRG LVL3 (GOWN DISPOSABLE) ×1 IMPLANT
GOWN STRL REUS W/ TWL XL LVL3 (GOWN DISPOSABLE) ×1 IMPLANT
GOWN STRL REUS W/TWL LRG LVL3 (GOWN DISPOSABLE) ×2
GOWN STRL REUS W/TWL XL LVL3 (GOWN DISPOSABLE) ×2
IRRIGATION STRYKERFLOW (MISCELLANEOUS) ×1 IMPLANT
IRRIGATOR STRYKERFLOW (MISCELLANEOUS) ×3
IV NS 1000ML (IV SOLUTION) ×2
IV NS 1000ML BAXH (IV SOLUTION) ×1 IMPLANT
LABEL OR SOLS (LABEL) ×3 IMPLANT
NEEDLE HYPO 25X1 1.5 SAFETY (NEEDLE) ×3 IMPLANT
NS IRRIG 500ML POUR BTL (IV SOLUTION) ×3 IMPLANT
PACK LAP CHOLECYSTECTOMY (MISCELLANEOUS) ×3 IMPLANT
PAD GROUND ADULT SPLIT (MISCELLANEOUS) ×3 IMPLANT
RELOAD CUTTER ETS 35MM STAND (ENDOMECHANICALS) ×3 IMPLANT
SCISSORS METZENBAUM CVD 33 (INSTRUMENTS) IMPLANT
SHEARS HARMONIC ACE PLUS 36CM (ENDOMECHANICALS) ×3 IMPLANT
SLEEVE ENDOPATH XCEL 5M (ENDOMECHANICALS) ×3 IMPLANT
SPOGE SURGIFLO 8M (HEMOSTASIS) ×2
SPONGE SURGIFLO 8M (HEMOSTASIS) ×1 IMPLANT
STAPLER SKIN PROX 35W (STAPLE) ×3 IMPLANT
STRAP SAFETY BODY (MISCELLANEOUS) ×3 IMPLANT
STRIP CLOSURE SKIN 1/2X4 (GAUZE/BANDAGES/DRESSINGS) ×2 IMPLANT
SUT VIC AB 0 UR5 27 (SUTURE) ×6 IMPLANT
SUT VIC AB 4-0 RB1 27 (SUTURE) ×2
SUT VIC AB 4-0 RB1 27X BRD (SUTURE) ×1 IMPLANT
SWABSTK COMLB BENZOIN TINCTURE (MISCELLANEOUS) ×3 IMPLANT
TROCAR XCEL 12X100 BLDLESS (ENDOMECHANICALS) ×3 IMPLANT
TROCAR XCEL BLUNT TIP 100MML (ENDOMECHANICALS) ×3 IMPLANT
TROCAR XCEL NON-BLD 5MMX100MML (ENDOMECHANICALS) ×3 IMPLANT
TUBING INSUFFLATOR HI FLOW (MISCELLANEOUS) ×3 IMPLANT

## 2015-01-06 NOTE — Progress Notes (Signed)
   The wife, Theon Diop, was here at [facility]) for her husband's appendectomy on 01/06/2015.  Thank you. Marcelo Baldy, BSN, RN

## 2015-01-06 NOTE — Progress Notes (Signed)
Surgery  Dominica assume from Dr. Heath Lark. Case discussed with her directly this morning. All films have been reviewed history was confirmed by me personally.  Physical examination demonstrates a morbidly obese white male. There is significant focal peritoneal signs in the right lower quadrant.  I discussed with him laparoscopic appendectomy as what I feel is the  best option for resolution of this problem.   Both him and his wife are in agreement.   I discussed with them the operation and how it will be attempted laparoscopically with the distinct possibility of needing an open procedure.  He voiced no further questions and wished to proceed.

## 2015-01-06 NOTE — Anesthesia Preprocedure Evaluation (Addendum)
Anesthesia Evaluation  Patient identified by MRN, date of birth, ID band Patient awake    Reviewed: Allergy & Precautions, H&P , NPO status , Patient's Chart, lab work & pertinent test results, reviewed documented beta blocker date and time   Airway Mallampati: II  TM Distance: >3 FB Neck ROM: full    Dental  (+) Teeth Intact   Pulmonary neg pulmonary ROS, Current Smoker,    Pulmonary exam normal        Cardiovascular hypertension, negative cardio ROS Normal cardiovascular exam Rhythm:regular Rate:Normal     Neuro/Psych PSYCHIATRIC DISORDERS negative neurological ROS  negative psych ROS   GI/Hepatic negative GI ROS, Neg liver ROS,   Endo/Other  negative endocrine ROSdiabetesMorbid obesity  Renal/GU Renal diseasenegative Renal ROS  negative genitourinary   Musculoskeletal   Abdominal   Peds  Hematology negative hematology ROS (+)   Anesthesia Other Findings   Reproductive/Obstetrics negative OB ROS                             Anesthesia Physical Anesthesia Plan  ASA: III and emergent  Anesthesia Plan: General ETT   Post-op Pain Management:    Induction:   Airway Management Planned:   Additional Equipment:   Intra-op Plan:   Post-operative Plan:   Informed Consent: I have reviewed the patients History and Physical, chart, labs and discussed the procedure including the risks, benefits and alternatives for the proposed anesthesia with the patient or authorized representative who has indicated his/her understanding and acceptance.     Plan Discussed with: CRNA  Anesthesia Plan Comments:        Anesthesia Quick Evaluation

## 2015-01-06 NOTE — Anesthesia Procedure Notes (Signed)
Procedure Name: Intubation Performed by: Rolla Plate Pre-anesthesia Checklist: Patient identified, Patient being monitored, Timeout performed, Emergency Drugs available and Suction available Patient Re-evaluated:Patient Re-evaluated prior to inductionOxygen Delivery Method: Circle system utilized Preoxygenation: Pre-oxygenation with 100% oxygen Intubation Type: IV induction and Rapid sequence Laryngoscope Size: Miller and 2 Grade View: Grade II Tube type: Oral Tube size: 7.0 mm Number of attempts: 1 Airway Equipment and Method: Stylet and Patient positioned with wedge pillow Placement Confirmation: ETT inserted through vocal cords under direct vision,  positive ETCO2 and breath sounds checked- equal and bilateral Secured at: 23 cm Tube secured with: Tape Dental Injury: Teeth and Oropharynx as per pre-operative assessment

## 2015-01-06 NOTE — Transfer of Care (Signed)
Immediate Anesthesia Transfer of Care Note  Patient: Brian [name]  Procedure(s) Performed: Procedure(s): APPENDECTOMY LAPAROSCOPIC drainage of peritoneal abcess (N/A)  Patient Location: PACU  Anesthesia Type:General  Level of Consciousness: awake  Airway & Oxygen Therapy: Patient Spontanous Breathing and Patient connected to face mask oxygen  Post-op Assessment: Report given to RN  Post vital signs: Reviewed  Last Vitals:  Filed Vitals:   01/06/15 1524  BP: 181/97  Pulse: 102  Temp: 38.1 C  Resp: 23    Complications: No apparent anesthesia complications

## 2015-01-06 NOTE — Care Management (Addendum)
Patient in surgery this afternoon. No family in room. Will address medications concerns prior to discharge. Plan is to sign patient up for [facility] and [facility].More to follow.

## 2015-01-06 NOTE — Progress Notes (Addendum)
Inpatient Diabetes Program Recommendations  AACE/ADA: New Consensus Statement on Inpatient Glycemic Control (2015)  Target Ranges:  Prepandial:   less than 140 mg/dL      Peak postprandial:   less than 180 mg/dL (1-2 hours)      Critically ill patients:  140 - 180 mg/dL  Results for Brian [name], Brian [name] (MRN [id number]) as of 01/06/2015 10:34  Ref. Range 01/05/2015 21:26 01/06/2015 06:38  Glucose Latest Ref Range: 65-99 mg/dL 415 (H) 262 (H)   Review of Glycemic Control  Diabetes history: DM2 Outpatient Diabetes medications: Per H&P, No DM meds in past 2 months since losing job Current orders for Inpatient glycemic control: Novolog 0-15 Q6H  Inpatient Diabetes Program Recommendations: Insulin - Basal: Please consider ordering low dose basal insulin. Recommend starting with Lantus 14 units QHS (based on 136 kg x 0.1 units).   Note: No medications listed on the home medication list. In reviewing the chart, noted [facility] discharge summary from 04/16/14 in which patient was discharged on Glyxambi 10-5 mg QAM and Metformin 1000 mg BID for DM management. According to the chart, patient has not taken any DM medications in 2 months since losing his job and currently has no insurance. Patient will need DM medications for glycemic control. Patient is currently NPO for surgery today. Recommend starting low dose basal insulin at this time. A1C is in process.  01/06/15@14 :45-Went to talk with patient regarding diabetes outpatient regimen for diabetes control. Patient is currently in surgery. Therefore, diabetes coordinator will follow up with patient tomorrow morning.   Thanks, Barnie Alderman, RN, MSN, CDE Diabetes Coordinator Inpatient Diabetes Program [phone] (Team Pager from [place] to [place]) [phone] ([place] office) [phone] [facility] office) [phone] [facility] office)

## 2015-01-06 NOTE — Progress Notes (Signed)
Patient ID: Brian [name], male   DOB: 08-20-1960, 54 y.o.   MRN: [id number] [facility] PROGRESS NOTE  PCP: No PCP Per Patient  HPI/Subjective: Patient with 10/10 abdominal pain, not relieved with medication. Lost insurance and he does not take any meds at home.  Objective: Filed Vitals:   01/06/15 0600  BP: 157/89  Pulse: 86  Temp: 98.2 F (36.8 C)  Resp: 20    Filed Weights   01/05/15 2103 01/06/15 0055  Weight: 131.543 kg (290 lb) 136.805 kg (301 lb 9.6 oz)    ROS: Review of Systems  Constitutional: Negative for fever and chills.  Eyes: Negative for blurred vision.  Respiratory: Negative for cough and shortness of breath.   Cardiovascular: Negative for chest pain.  Gastrointestinal: Positive for nausea and abdominal pain. Negative for vomiting, diarrhea and constipation.  Genitourinary: Negative for dysuria.  Musculoskeletal: Negative for joint pain.  Neurological: Negative for dizziness and headaches.   Exam: Physical Exam  Constitutional: He is oriented to person, place, and time.  HENT:  Nose: No mucosal edema.  Mouth/Throat: No oropharyngeal exudate or posterior oropharyngeal edema.  Eyes: Conjunctivae, EOM and lids are normal. Pupils are equal, round, and reactive to light.  Neck: No JVD present. Carotid bruit is not present. No edema present. No thyroid mass and no thyromegaly present.  Cardiovascular: S1 normal and S2 normal.  Exam reveals no gallop.   No murmur heard. Pulses:      Dorsalis pedis pulses are 2+ on the right side, and 2+ on the left side.  Respiratory: No respiratory distress. He has no wheezes. He has no rhonchi. He has no rales.  GI: Soft. Bowel sounds are normal. There is no tenderness.  Musculoskeletal:       Right ankle: He exhibits swelling.       Left ankle: He exhibits swelling.  Lymphadenopathy:    He has no cervical adenopathy.  Neurological: He is alert and oriented to person, place, and time. No cranial nerve  deficit.  Skin: Skin is warm. No rash noted. Nails show no clubbing.  Lesion growth under chin  Psychiatric: He has a normal mood and affect.    Data Reviewed: Basic Metabolic Panel:  Recent Labs Lab 01/05/15 2126 01/06/15 0638  NA 130* 135  K 3.9 3.9  CL 101 104  CO2 22 24  GLUCOSE 415* 262*  BUN 25* 24*  CREATININE 1.43* 1.38*  CALCIUM 8.2* 8.0*   Liver Function Tests:  Recent Labs Lab 01/05/15 2126  AST 23  ALT 15*  ALKPHOS 80  BILITOT 0.4  PROT 6.8  ALBUMIN 2.6*    Recent Labs Lab 01/05/15 2126  LIPASE 35   CBC:  Recent Labs Lab 01/05/15 2126 01/06/15 0638  WBC 12.7* 9.5  NEUTROABS 9.7*  --   HGB 12.2* 12.3*  HCT 36.1* 36.7*  MCV 90.2 90.4  PLT 217 179   CBG:  Recent Labs Lab 01/05/15 2357 01/06/15 0624  GLUCAP 225* 260*    Recent Results (from the past 240 hour(s))  MRSA PCR Screening     Status: None   Collection Time: 01/06/15  1:12 AM  Result Value Ref Range Status   MRSA by PCR NEGATIVE NEGATIVE Final    Comment:        The GeneXpert MRSA Assay (FDA approved for NASAL specimens only), is one component of a comprehensive MRSA colonization surveillance program. It is not intended to diagnose MRSA infection nor to guide or monitor treatment  for MRSA infections.      Studies: Ct Abdomen Pelvis W Contrast  01/05/2015  CLINICAL DATA:  Acute onset of right lower quadrant abdominal pain. Initial encounter. EXAM: CT ABDOMEN AND PELVIS WITH CONTRAST TECHNIQUE: Multidetector CT imaging of the abdomen and pelvis was performed using the standard protocol following bolus administration of intravenous contrast. CONTRAST:  140mL OMNIPAQUE IOHEXOL 300 MG/ML  SOLN COMPARISON:  CT of the abdomen and pelvis from 04/11/2014 FINDINGS: The visualized lung bases are clear. The liver and spleen are unremarkable in appearance. The patient is status post cholecystectomy, with clips noted at the gallbladder fossa. The pancreas and adrenal glands are  unremarkable. The kidneys are unremarkable in appearance. There is no evidence of hydronephrosis. No renal or ureteral stones are seen. Nonspecific perinephric stranding is noted bilaterally. No free fluid is identified. The small bowel is unremarkable in appearance. The stomach is within normal limits. No acute vascular abnormalities are seen. Scattered calcification is noted along the abdominal aorta and its branches. The appendix is dilated to 2.4 cm in diameter, with a region of phlegmon noted at the right lower quadrant. There is no definite evidence of perforation, though the lumen is difficult to fully assess. Surrounding soft tissue inflammation and trace fluid are seen. There is no evidence of abscess formation at this time. The colon is unremarkable in appearance. The bladder is mildly distended and grossly unremarkable. The prostate remains normal in size. No inguinal lymphadenopathy is seen. No acute osseous abnormalities are identified. Vacuum phenomenon is noted at L3-L4 and L4-L5, and minimally along the lower thoracic spine. IMPRESSION: 1. Region of phlegmon noted at the right lower quadrant, with dilatation of the appendix to 2.4 cm in diameter, compatible with acute appendicitis. No definite evidence of perforation, though the lumen is difficult to fully assess. Surrounding soft tissue inflammation and trace fluid noted. No evidence of abscess formation at this time. 2. Scattered calcification along the abdominal aorta and its branches. These results were called by telephone at the time of interpretation on 01/05/2015 at 10:50 pm to Dr. Charlotte Crumb, who verbally acknowledged these results. Electronically Signed   By: Garald Balding M.D.   On: 01/05/2015 22:51    Scheduled Meds: . acetaminophen  1,000 mg Oral 4 times per day  . enoxaparin (LOVENOX) injection  40 mg Subcutaneous BID  . [START ON 01/07/2015] Influenza vac split quadrivalent PF  0.5 mL Intramuscular Tomorrow-1000  . insulin  aspart  0-15 Units Subcutaneous Q6H  . lisinopril  10 mg Oral Daily  . nicotine  14 mg Transdermal Daily  . pantoprazole (PROTONIX) IV  40 mg Intravenous QHS  . piperacillin-tazobactam (ZOSYN)  IV  4.5 g Intravenous 3 times per day   Continuous Infusions: . 0.9 % NaCl with KCl 20 mEq / L 125 mL/hr at 01/06/15 0118    Assessment/Plan:  1. Pre-operative consult.  EKG ordered. No contraindications to surgery for acute appendicitis at this time. 2. Acute appendicitis- IV zosyn, surgery to take to OR today 3. Type 2 diabetes- sliding scale while npo, Hga1c pending 4. Essential HTN- Lisinopril 5. Tobacco abuse- nicotine patch 6. Lesion under chin suspicious for basal cell skin cancer   Code Status:     Code Status Orders        Start     Ordered   01/06/15 0008  Full code   Continuous     01/06/15 0009     Family Communication: family at bedside Disposition Plan: TBD  Antibiotics:  zosyn  Time spent: 66minutes  Loletha Grayer  [facility]

## 2015-01-06 NOTE — Op Note (Signed)
01/05/2015 - 01/06/2015  3:15 PM  PATIENT:  Brian [name]  54 y.o. male  PRE-OPERATIVE DIAGNOSIS:  Acute APPENDICITIS, periappendiceal abcsess  POST-OPERATIVE DIAGNOSIS:  Same.  PROCEDURE:  Procedure(s): APPENDECTOMY LAPAROSCOPIC drainage of peritoneal abcess (N/A)  SURGEON:  Surgeon(s) and Role:    * Sherri Rad, MD - Primary  ASSISTANTS: Tech  ANESTHESIA: Gen. endotracheal     SPECIMEN: Appendix to pathology    EBL: 50 cc  Description of procedure:   With the patient supine position general endotracheal anesthesia was induced. His left arm was padded and tucked at his side. The patient's abdomen was widely prepped and draped core prep solution. Timeout was observed.  A 12 mm blunt Hassan trocar was placed via an infraumbilical transversely oriented skin incision with stay sutures being passed to the fascia. Small umbilical hernia was used to place the trocar. Laparoscopic evaluation of the abdomen demonstrated an inflamed appendix within the right lower quadrant. The patient was then positioned in steep Trendelenburg and airplane right side up. 12 mm blade less trocar was placed in the right upper quadrant. 5 mm bladed trochars placed in the left lower quadrant. Dissection was then initiated laterally to the colon and cecum. The course of the confluence of the tinea as well as the terminal ileum was identified. The base of the appendix appeared to be uninvolved and pointed anteriorly.  The body appendix pointed laterally into the periappendiceal abscess.    Utilizing blunt technique and suction a small abscess was encountered drained and irrigated. A mesenteric window was fashioned at the base of the appendix utilizing blunt technique.  The base of the appendix was transected with the endoscopic blue load. Further dissection demonstrated the tip of the appendix which appeared to be gangrenous. The appendix was elevated towards the anterior abdominal wall and utilizing the Harmonic  scalpel with advanced hemostasis feature the mesoappendix was divided.  The appendix was captured in an Endo Catch device and easily retrieved. During the dissection an additional 5 mm trocar was placed in the suprapubic midline for further retraction.  Pneumoperitoneum being reestablished.  The right lower quadrant was irrigated with 1 L of warm normal saline and aspirated dry and hemostasis appeared to be adequate.  There was no evidence of bowel injury to either the small bowel or the cecum. Surgiflo with thrombin application was applied to the raw surface of the anterior abdominal wall.  Ports are then removed under direct visualization. The infraumbilical fascial defect was reapproximated with multiple figure-of-eight #0 Vicryl sutures in vertical orientation. A total of 30 cc of 0.25% plain Marcaine was infiltrated along all skin and fascial incisions prior to closure. Skin staples are used to reapproximate skin edges followed by Telfa and Tegaderm  She was then extubated and transferred to the recovery room in stable and satisfactory condition by anesthesia services.  Hortencia Conradi, MD, FACS

## 2015-01-06 NOTE — Progress Notes (Signed)
Patient ID: Brian [name], male   DOB: [birth year], 54 y.o.   MRN: [id number]   Surgery postop  The patient is doing well. Pain seems inadequately controlled on existing regimen.  Filed Vitals:   01/06/15 0600 01/06/15 1524 01/06/15 1600 01/06/15 1631  BP: 157/89 181/97  156/78  Pulse: 86 102  91  Temp: 98.2 F (36.8 C) 100.5 F (38.1 C) 99.4 F (37.4 C) 98.5 F (36.9 C)  TempSrc: Oral   Oral  Resp: 20 23    Height:      Weight:      SpO2: 94% 99%  97%    Physical examination his abdomen is soft. Dressings are intact. He is awake and alert. Wife is at bedside.  Impression doing well status post lap scopic appendectomy for gangrenous perforated appendicitis with periappendiceal abscess.  Plan continue intravenous antibiotics. I anticipate he can probably be discharged the next 24-48 hours. Certainly sugars need to be well-controlled and medicine certainly needs to have a plan for his outpatient diabetes mellitus treatment.  Case management is involved in the case.

## 2015-01-07 ENCOUNTER — Encounter: Payer: Self-pay | Admitting: Surgery

## 2015-01-07 LAB — GLUCOSE, CAPILLARY
GLUCOSE-CAPILLARY: 185 mg/dL — AB (ref 65–99)
GLUCOSE-CAPILLARY: 159 mg/dL — AB (ref 65–99)
GLUCOSE-CAPILLARY: 152 mg/dL — AB (ref 65–99)
Glucose-Capillary: 213 mg/dL — ABNORMAL HIGH (ref 65–99)
Glucose-Capillary: 115 mg/dL — ABNORMAL HIGH (ref 65–99)

## 2015-01-07 MED ORDER — INSULIN ASPART PROT & ASPART (70-30 MIX) 100 UNIT/ML ~~LOC~~ SUSP
10.0000 [IU] | Freq: Two times a day (BID) | SUBCUTANEOUS | Status: DC
  Administered 2015-01-07 – 2015-01-08 (×3): 10 [IU] via SUBCUTANEOUS
  Filled 2015-01-07 (×3): qty 10

## 2015-01-07 MED ORDER — INSULIN STARTER KIT- SYRINGES (ENGLISH)
1.0000 | Freq: Once | Status: AC
  Administered 2015-01-07: 1
  Filled 2015-01-07: qty 1

## 2015-01-07 MED ORDER — INSULIN ASPART 100 UNIT/ML ~~LOC~~ SOLN
0.0000 [IU] | Freq: Two times a day (BID) | SUBCUTANEOUS | Status: DC
  Administered 2015-01-07 – 2015-01-08 (×2): 3 [IU] via SUBCUTANEOUS
  Filled 2015-01-07 (×2): qty 3

## 2015-01-07 MED ORDER — INSULIN ASPART 100 UNIT/ML ~~LOC~~ SOLN: 0.0000 [IU] | Freq: Three times a day (TID) | SUBCUTANEOUS | Status: DC

## 2015-01-07 NOTE — Progress Notes (Signed)
Per Dr. Marina Gravel okay to change fluids to 3ml per hour.

## 2015-01-07 NOTE — Progress Notes (Signed)
Patient ID: Brian [name], male   DOB: 1961/01/14, 54 y.o.   MRN: [id number] [facility] PROGRESS NOTE  PCP: No PCP Per Patient  HPI/Subjective: Patient feeling a little bit better. Some soreness in the abdomen. No nausea or vomiting.  Objective: Filed Vitals:   01/07/15 0537  BP: 117/58  Pulse: 97  Temp: 99.9 F (37.7 C)  Resp: 16    Filed Weights   01/05/15 2103 01/06/15 0055  Weight: 131.543 kg (290 lb) 136.805 kg (301 lb 9.6 oz)    ROS: Review of Systems  Constitutional: Negative for fever and chills.  Eyes: Negative for blurred vision.  Respiratory: Negative for cough and shortness of breath.   Cardiovascular: Negative for chest pain.  Gastrointestinal: Positive for abdominal pain. Negative for nausea, vomiting, diarrhea and constipation.  Genitourinary: Negative for dysuria.  Musculoskeletal: Negative for joint pain.  Neurological: Negative for dizziness and headaches.   Exam: Physical Exam  Constitutional: He is oriented to person, place, and time.  HENT:  Nose: No mucosal edema.  Mouth/Throat: No oropharyngeal exudate or posterior oropharyngeal edema.  Eyes: Conjunctivae, EOM and lids are normal. Pupils are equal, round, and reactive to light.  Neck: No JVD present. Carotid bruit is not present. No edema present. No thyroid mass and no thyromegaly present.  Cardiovascular: S1 normal and S2 normal.  Exam reveals no gallop.   No murmur heard. Pulses:      Dorsalis pedis pulses are 2+ on the right side, and 2+ on the left side.  Respiratory: No respiratory distress. He has no wheezes. He has no rhonchi. He has no rales.  GI: Soft. Bowel sounds are normal. There is tenderness in the right lower quadrant.  Musculoskeletal:       Right ankle: He exhibits swelling.       Left ankle: He exhibits swelling.  Lymphadenopathy:    He has no cervical adenopathy.  Neurological: He is alert and oriented to person, place, and time. No cranial nerve deficit.   Skin: Skin is warm. No rash noted. Nails show no clubbing.  Lesion growth under chin  Psychiatric: He has a normal mood and affect.    Data Reviewed: Basic Metabolic Panel:  Recent Labs Lab 01/05/15 2126 01/06/15 0638  NA 130* 135  K 3.9 3.9  CL 101 104  CO2 22 24  GLUCOSE 415* 262*  BUN 25* 24*  CREATININE 1.43* 1.38*  CALCIUM 8.2* 8.0*   Liver Function Tests:  Recent Labs Lab 01/05/15 2126  AST 23  ALT 15*  ALKPHOS 80  BILITOT 0.4  PROT 6.8  ALBUMIN 2.6*    Recent Labs Lab 01/05/15 2126  LIPASE 35   CBC:  Recent Labs Lab 01/05/15 2126 01/06/15 0638  WBC 12.7* 9.5  NEUTROABS 9.7*  --   HGB 12.2* 12.3*  HCT 36.1* 36.7*  MCV 90.2 90.4  PLT 217 179   CBG:  Recent Labs Lab 01/06/15 1144 01/06/15 1658 01/06/15 2151 01/07/15 0556 01/07/15 0740  GLUCAP 197* 239* 239* 213* 185*    Recent Results (from the past 240 hour(s))  MRSA PCR Screening     Status: None   Collection Time: 01/06/15  1:12 AM  Result Value Ref Range Status   MRSA by PCR NEGATIVE NEGATIVE Final    Comment:        The GeneXpert MRSA Assay (FDA approved for NASAL specimens only), is one component of a comprehensive MRSA colonization surveillance program. It is not intended to diagnose MRSA  infection nor to guide or monitor treatment for MRSA infections.      Studies: Ct Abdomen Pelvis W Contrast  01/05/2015  CLINICAL DATA:  Acute onset of right lower quadrant abdominal pain. Initial encounter. EXAM: CT ABDOMEN AND PELVIS WITH CONTRAST TECHNIQUE: Multidetector CT imaging of the abdomen and pelvis was performed using the standard protocol following bolus administration of intravenous contrast. CONTRAST:  133mL OMNIPAQUE IOHEXOL 300 MG/ML  SOLN COMPARISON:  CT of the abdomen and pelvis from 04/11/2014 FINDINGS: The visualized lung bases are clear. The liver and spleen are unremarkable in appearance. The patient is status post cholecystectomy, with clips noted at the  gallbladder fossa. The pancreas and adrenal glands are unremarkable. The kidneys are unremarkable in appearance. There is no evidence of hydronephrosis. No renal or ureteral stones are seen. Nonspecific perinephric stranding is noted bilaterally. No free fluid is identified. The small bowel is unremarkable in appearance. The stomach is within normal limits. No acute vascular abnormalities are seen. Scattered calcification is noted along the abdominal aorta and its branches. The appendix is dilated to 2.4 cm in diameter, with a region of phlegmon noted at the right lower quadrant. There is no definite evidence of perforation, though the lumen is difficult to fully assess. Surrounding soft tissue inflammation and trace fluid are seen. There is no evidence of abscess formation at this time. The colon is unremarkable in appearance. The bladder is mildly distended and grossly unremarkable. The prostate remains normal in size. No inguinal lymphadenopathy is seen. No acute osseous abnormalities are identified. Vacuum phenomenon is noted at L3-L4 and L4-L5, and minimally along the lower thoracic spine. IMPRESSION: 1. Region of phlegmon noted at the right lower quadrant, with dilatation of the appendix to 2.4 cm in diameter, compatible with acute appendicitis. No definite evidence of perforation, though the lumen is difficult to fully assess. Surrounding soft tissue inflammation and trace fluid noted. No evidence of abscess formation at this time. 2. Scattered calcification along the abdominal aorta and its branches. These results were called by telephone at the time of interpretation on 01/05/2015 at 10:50 pm to Dr. Charlotte Crumb, who verbally acknowledged these results. Electronically Signed   By: Garald Balding M.D.   On: 01/05/2015 22:51    Scheduled Meds: . acetaminophen  1,000 mg Oral 4 times per day  . enoxaparin (LOVENOX) injection  40 mg Subcutaneous BID  . Influenza vac split quadrivalent PF  0.5 mL  Intramuscular Tomorrow-1000  . insulin aspart  0-15 Units Subcutaneous TID WC & HS  . insulin aspart protamine- aspart  10 Units Subcutaneous BID WC  . lisinopril  10 mg Oral Daily  . nicotine  14 mg Transdermal Daily  . piperacillin-tazobactam (ZOSYN)  IV  4.5 g Intravenous 3 times per day   Continuous Infusions: . 0.9 % NaCl with KCl 20 mEq / L 125 mL/hr at 01/07/15 0555    Assessment/Plan:  1. Type 2 diabetes uncontrolled. Hemoglobin A1c 11.7. Because of cost reasons we'll have to give 70/30 insulin. I will start 10 units twice a day. Once patient is placed on diet will probably have to go up to 18 units twice a day. 2. Acute appendicitis with perforation and abscess- continue IV zosyn. Status post surgery on 01/06/2015. 3. Essential HTN- Lisinopril 4. Tobacco abuse- nicotine patch 5. Lesion under chin suspicious for basal cell skin cancer 6. Likely an element of chronic kidney disease stage III. Continue to monitor with IV fluid hydration.   Code Status:  Code Status Orders        Start     Ordered   01/06/15 0008  Full code   Continuous     01/06/15 0009     Family Communication: Wife on the phone Disposition Plan: As per surgical team  Antibiotics:  zosyn  Time spent: 35minutes  Doniesha Landau, [facility]

## 2015-01-07 NOTE — Care Management (Signed)
Spoke with patient who is from home with spouse. Spouse has income but household income is low enough to qualify for charitable services.  Discussed resources with patient and gave applications for [facility], and [facility]. Patient stated that he has no PCP but that he has an appointment with Dr Clide Deutscher at [facility] December 15th. He agrees to fil out application for [facility] today so that I may fax it over. Patient given [facility] $4 list. Patient given GLUCOMETER and TEST strips(50ct).  Discussed case with Mathis Bud, diabetes coordinator who is recommending patient go home on insulin 70/30. Diabetic teaching has been done. Will review medications at time of discharge and fax RX to [facility].

## 2015-01-07 NOTE — Progress Notes (Signed)
Inpatient Diabetes Program Recommendations  AACE/ADA: New Consensus Statement on Inpatient Glycemic Control (2015)  Target Ranges:  Prepandial:   less than 140 mg/dL      Peak postprandial:   less than 180 mg/dL (1-2 hours)      Critically ill patients:  140 - 180 mg/dL   Review of Glycemic Control Diabetes history: DM2 Outpatient Diabetes medications: Per H&P, No DM meds in past 2 months since losing job Current orders for Inpatient glycemic control: Novolog 0-15 ACHS, 70/30 10 units BID  Inpatient Diabetes Program Recommendations: Insulin - Basal: Patient will be started on 70/30 10 units BID today. Agree with starting dose which will provide 14 units for basal and 6 units for meal coverage per day. However, anticipate that once patient is eating Carb Modified diet insulin will need to be increased.  Note: Spoke with patient about diabetes and home regimen for diabetes control. Patient reports that since he lost his job about 2 months ago he was not able to afford to take his DM medications. Patient will be started on 70/30 insulin since it is a more affordable insulin. Patient will be seen by Liliane Channel, [place] and the plan is to have patient follow up with the [facility] and go to the [facility] for medication assistance.  Discussed A1C results (11.7& on 01/05/15) and reviewed what an A1C is and explained that current A1C indicates an average glucose of 291 mg/dl. Informed patient that he is to be started on 70/30 and discussed 70/30 insulin with patient. Patient does not have a glucometer or testing supplies at home and will need one for home. Instructed patient to check with Case Management to see about getting a glucometer and testing supplies but also informed patient about the Reli-On glucometer which is $10 at [facility] and box of 50 test strips is $9 at [employer]. Patient states that he will purchase one at [facility] if he is not able to get one from other resources.  Patient  reports that his wife takes Lantus using insulin pens. Explained that due to the cost of insulin pens (around $450 for a box of 5 pens) he will be started on 70/30 insulin with vial and syringe. Patient is agreeable with plan. Educated patient on insulin use at home.  Reviewed all steps of drawing up and injecting insulin with vial and syringe. Discussed disposal of sharps, storage of unused insulin, disposal of insulin etc. Patient able to provide successful return demonstration using proper technique.  Patient verbalized understanding of information discussed and he states that he has no further questions at this time related to diabetes. Ordered insulin syringe starter kit. Talked with Ledell Noss, RN and asked that she provide patient with insulin starter kit and review it with him. RNs to provide ongoing basic DM education at bedside with this patient and engage patient to actively check blood glucose and administer insulin injections.   MD to give patient Rxs for insulin vial and syringe and glucometer and testing supplies at time of discharge.   Thanks, Barnie Alderman, RN, MSN, CDE Diabetes Coordinator Inpatient Diabetes Program [phone] (Team Pager from [place] to [place]) [phone] ([facility] office) [phone] [facility] office) [phone] [facility] office)

## 2015-01-07 NOTE — Care Management (Signed)
Spoke with patient about application for [facility]. Patient has not filled out applications as asked. Would like to fill it out tonight when his wife gets here. Will give it to me tomorrow.

## 2015-01-07 NOTE — Progress Notes (Signed)
Okay per Dr. Marina Gravel to change sliding scale to match ACHS fingerstick times.

## 2015-01-07 NOTE — Progress Notes (Signed)
Spoke to Dr. Leslye Peer okay to change sliding scale coverage to only lunch and bedtime.

## 2015-01-07 NOTE — Progress Notes (Signed)
Patient has not had urine output this shift. Bladder scanned patient and scan showed 44ml with several different attempts at moving scanner into different locations.

## 2015-01-07 NOTE — Progress Notes (Addendum)
ANTIBIOTIC CONSULT NOTE - INITIAL  Pharmacy Consult for Zosyn Indication: pneumonia  Allergies  Allergen Reactions  . Naproxen Rash    Patient Measurements: Height: 5\' 9"  (175.3 cm) Weight: (!) 301 lb 9.6 oz (136.805 kg) IBW/kg (Calculated) : 70.7 Adjusted Body Weight: 96.2 kg  Vital Signs: Temp: 98 F (36.7 C) (11/15 0818) Temp Source: Oral (11/15 0818) BP: 132/69 mmHg (11/15 0818) Pulse Rate: 91 (11/15 0818) Labs:  Recent Labs  01/05/15 2126 01/06/15 0638  WBC 12.7* 9.5  HGB 12.2* 12.3*  PLT 217 179  CREATININE 1.43* 1.38*   Estimated Creatinine Clearance: 83.1 mL/min (by C-G formula based on Cr of 1.38).  Microbiology: Recent Results (from the past 720 hour(s))  MRSA PCR Screening     Status: None   Collection Time: 01/06/15  1:12 AM  Result Value Ref Range Status   MRSA by PCR NEGATIVE NEGATIVE Final    Comment:        The GeneXpert MRSA Assay (FDA approved for NASAL specimens only), is one component of a comprehensive MRSA colonization surveillance program. It is not intended to diagnose MRSA infection nor to guide or monitor treatment for MRSA infections.    Assessment: Pharmacy consulted to dose zosyn for perforated appendicitis in this 54 year old male  Plan:  Will continue current order for zosyn 4.5gm IV Q8H extended infusion due to pt weight > 120 kg  Pharmacy to follow per consult  Bernardo Brayman C, Pharm.D.  Clinical Pharmacist 01/07/2015,9:11 AM

## 2015-01-07 NOTE — Progress Notes (Signed)
Surgery.  Postop day #1  Patient is feeling much better he is having some minor discomfort. No gas or bowel movements surgery. Pain seems well controlled.  Filed Vitals:   01/07/15 0818 01/07/15 1032 01/07/15 1410 01/07/15 1705  BP: 132/69 126/67 123/66 119/72  Pulse: 91 91 89 92  Temp: 98 F (36.7 C)  98 F (36.7 C) 98.5 F (36.9 C)  TempSrc: Oral  Oral Oral  Resp:    20  Height:      Weight:      SpO2: 98%  97% 97%    CBC Latest Ref Rng 01/06/2015 01/05/2015 10/08/2014  WBC 3.8 - 10.6 K/uL 9.5 12.7(H) 13.9(H)  Hemoglobin 13.0 - 18.0 g/dL 12.3(L) 12.2(L) 12.9(L)  Hematocrit 40.0 - 52.0 % 36.7(L) 36.1(L) 38.2(L)  Platelets 150 - 440 K/uL 179 217 251    BMP Latest Ref Rng 01/06/2015 01/05/2015 10/08/2014  Glucose 65 - 99 mg/dL 262(H) 415(H) 295(H)  BUN 6 - 20 mg/dL 24(H) 25(H) 28(H)  Creatinine 0.61 - 1.24 mg/dL 1.38(H) 1.43(H) 1.26(H)  Sodium 135 - 145 mmol/L 135 130(L) 133(L)  Potassium 3.5 - 5.1 mmol/L 3.9 3.9 4.5  Chloride 101 - 111 mmol/L 104 101 98(L)  CO2 22 - 32 mmol/L 24 22 25   Calcium 8.9 - 10.3 mg/dL 8.0(L) 8.2(L) 9.4    On physical examination the patient's abdomen is obese soft and minimally tender. Dressings are dry and intact.  Impression doing well postoperative day #1 status post lap scopic appendectomy.  Plan once internal medicine is ready for him to be discharged on a stable insulin regiment he can be discharged. His diet will be advanced. Continue intravenous antibiotics right now.

## 2015-01-08 LAB — GLUCOSE, CAPILLARY
Glucose-Capillary: 237 mg/dL — ABNORMAL HIGH (ref 65–99)
Glucose-Capillary: 151 mg/dL — ABNORMAL HIGH (ref 65–99)
Glucose-Capillary: 151 mg/dL — ABNORMAL HIGH (ref 65–99)

## 2015-01-08 MED ORDER — INSULIN NPH ISOPHANE & REGULAR (70-30) 100 UNIT/ML ~~LOC~~ SUSP: 15.0000 [IU] | Freq: Two times a day (BID) | SUBCUTANEOUS | Status: DC

## 2015-01-08 MED ORDER — LISINOPRIL 10 MG PO TABS: 10.0000 mg | ORAL_TABLET | Freq: Every day | ORAL | Status: DC

## 2015-01-08 MED ORDER — OXYCODONE HCL 5 MG PO TABS: 5.0000 mg | ORAL_TABLET | Freq: Four times a day (QID) | ORAL | Status: DC | PRN

## 2015-01-08 MED ORDER — INSULIN ASPART PROT & ASPART (70-30 MIX) 100 UNIT/ML ~~LOC~~ SUSP: 15.0000 [IU] | Freq: Two times a day (BID) | SUBCUTANEOUS | Status: DC

## 2015-01-08 MED ORDER — CEPHALEXIN 500 MG PO CAPS: 500.0000 mg | ORAL_CAPSULE | Freq: Four times a day (QID) | ORAL | Status: DC

## 2015-01-08 NOTE — Progress Notes (Signed)
Patient ID: KEELY HULVEY, male   DOB: 04-19-1960, 54 y.o.   MRN: [id number] [facility] PROGRESS NOTE  PCP: No PCP Per Patient  HPI/Subjective: Patient still having some abdominal soreness. Tolerating liquid diet. He is feeling better since the surgery.  Objective: Filed Vitals:   01/08/15 0628  BP: 152/88  Pulse: 104  Temp: 98.9 F (37.2 C)  Resp: 18    Filed Weights   01/05/15 2103 01/06/15 0055  Weight: 131.543 kg (290 lb) 136.805 kg (301 lb 9.6 oz)    ROS: Review of Systems  Constitutional: Negative for fever and chills.  Eyes: Negative for blurred vision.  Respiratory: Negative for cough and shortness of breath.   Cardiovascular: Negative for chest pain.  Gastrointestinal: Positive for abdominal pain. Negative for nausea, vomiting, diarrhea and constipation.  Genitourinary: Negative for dysuria.  Musculoskeletal: Negative for joint pain.  Neurological: Negative for dizziness and headaches.   Exam: Physical Exam  Constitutional: He is oriented to person, place, and time.  HENT:  Nose: No mucosal edema.  Mouth/Throat: No oropharyngeal exudate or posterior oropharyngeal edema.  Eyes: Conjunctivae, EOM and lids are normal. Pupils are equal, round, and reactive to light.  Neck: No JVD present. Carotid bruit is not present. No edema present. No thyroid mass and no thyromegaly present.  Cardiovascular: S1 normal and S2 normal.  Exam reveals no gallop.   No murmur heard. Pulses:      Dorsalis pedis pulses are 2+ on the right side, and 2+ on the left side.  Respiratory: No respiratory distress. He has no wheezes. He has no rhonchi. He has no rales.  GI: Soft. Bowel sounds are normal. There is tenderness in the right lower quadrant.  Musculoskeletal:       Right ankle: He exhibits swelling.       Left ankle: He exhibits swelling.  Lymphadenopathy:    He has no cervical adenopathy.  Neurological: He is alert and oriented to person, place, and time. No  cranial nerve deficit.  Skin: Skin is warm. No rash noted. Nails show no clubbing.  Lesion growth under chin  Psychiatric: He has a normal mood and affect.    Data Reviewed: Basic Metabolic Panel:  Recent Labs Lab 01/05/15 2126 01/06/15 0638  NA 130* 135  K 3.9 3.9  CL 101 104  CO2 22 24  GLUCOSE 415* 262*  BUN 25* 24*  CREATININE 1.43* 1.38*  CALCIUM 8.2* 8.0*   Liver Function Tests:  Recent Labs Lab 01/05/15 2126  AST 23  ALT 15*  ALKPHOS 80  BILITOT 0.4  PROT 6.8  ALBUMIN 2.6*    Recent Labs Lab 01/05/15 2126  LIPASE 35   CBC:  Recent Labs Lab 01/05/15 2126 01/06/15 0638  WBC 12.7* 9.5  NEUTROABS 9.7*  --   HGB 12.2* 12.3*  HCT 36.1* 36.7*  MCV 90.2 90.4  PLT 217 179   CBG:  Recent Labs Lab 01/07/15 0740 01/07/15 1156 01/07/15 1633 01/07/15 2154 01/08/15 0805  GLUCAP 185* 159* 152* 115* 151*    Recent Results (from the past 240 hour(s))  MRSA PCR Screening     Status: None   Collection Time: 01/06/15  1:12 AM  Result Value Ref Range Status   MRSA by PCR NEGATIVE NEGATIVE Final    Comment:        The GeneXpert MRSA Assay (FDA approved for NASAL specimens only), is one component of a comprehensive MRSA colonization surveillance program. It is not intended to diagnose  MRSA infection nor to guide or monitor treatment for MRSA infections.     Scheduled Meds: . acetaminophen  1,000 mg Oral 4 times per day  . enoxaparin (LOVENOX) injection  40 mg Subcutaneous BID  . insulin aspart  0-15 Units Subcutaneous q12n4p  . insulin aspart protamine- aspart  10 Units Subcutaneous BID WC  . lisinopril  10 mg Oral Daily  . nicotine  14 mg Transdermal Daily  . piperacillin-tazobactam (ZOSYN)  IV  4.5 g Intravenous 3 times per day   Continuous Infusions: . 0.9 % NaCl with KCl 20 mEq / L 30 mL/hr at 01/07/15 2141    Assessment/Plan:  1. Type 2 diabetes uncontrolled. Hemoglobin A1c 11.7. Because of cost reasons we'll have to give 70/30  insulin 10 units twice a day while on liquid diet. I will increase to 15 units subcutaneous injection twice a day upon discharge home.  2. Acute appendicitis with perforation and abscess- continue IV zosyn. Status post surgery on 01/06/2015. 3. Essential HTN- Lisinopril 4. Tobacco abuse- nicotine patch 5. Lesion under chin suspicious for basal cell skin cancer 6. Likely an element of chronic kidney disease stage III.  Disposition up to surgical team. I will sign off at this point in time. I wrote scripts for the 70/30 insulin and also lisinopril. I wrote scripts for insulin syringes, lancets and test strips. Patient has a glucometer already in the room. The patient knows how to inject insulin himself.  Code Status:     Code Status Orders        Start     Ordered   01/06/15 0008  Full code   Continuous     01/06/15 0009     Disposition Plan: As per surgical team. Medically stable at this point.  Antibiotics:  zosyn  Time spent: 76minutes  Loletha Grayer  [facility]

## 2015-01-08 NOTE — Care Management (Signed)
Discussed insulin discharge order with Mathis Bud RN Diabetes coordinator and also dr Leslye Peer. [facility]. Meter and strips given to patient.  Will go over and pick meds up for patient since discharge may be later this afternoon after med management closes.

## 2015-01-08 NOTE — Care Management (Signed)
Patient completed application for [facility]. Faxed to  Clinic Attn: Velva Harman [phone] ahead of patient discharge. Will send Rx over as soon as provided by physician.

## 2015-01-08 NOTE — Discharge Summary (Signed)
Physician Discharge Summary  Patient ID: Brian [name] MRN: [id number] DOB/AGE: Jun 26, 1960 54 y.o.  Admit date: 01/05/2015 Discharge date: 01/08/2015  Admission Diagnoses: Acute appendicitis Obesity Diabetes, poorly controlled.  Discharge Diagnoses:  Active Problems:   Neuropathy (HCC)   Hypercholesteremia   BP (high blood pressure)   Morbid obesity (HCC)   Compulsive tobacco user syndrome   Diabetes mellitus ([place])   DM (diabetes mellitus) type II controlled with renal manifestation (HCC)   Acute appendicitis with localized peritonitis   Discharged Condition: Stable and improved  Hospital Course: The patient was admitted with abdominal pain and workup consistent with acute appendicitis of several days duration. Blood sugars were markedly deranged. Internal medicine did become involved arrangements were made for him to have insulin as an outpatient with the assistance of [facility] clinic. By the time of his discharge following his operation his blood sugars was well-controlled his pain was under good control with oral pain medications. He was ambulatory. We'll see him back in the office in 7-10 days.  Consults: Internal medicine and case management.  Significant Diagnostic Studies: CT scan of the abdomen and pelvis  Treatments: Laparoscopic appendectomy  Discharge Exam: Blood pressure 152/88, pulse 104, temperature 98.9 F (37.2 C), temperature source Oral, resp. rate 18, height 5\' 9"  (1.753 m), weight 301 lb 9.6 oz (136.805 kg), SpO2 96 %.  His abdomen was soft incisions were healing nicely dressings were dry.  Disposition: 01-Home or Self Care  Discharge Instructions    Diet - low sodium heart healthy    Complete by:  As directed      Discharge instructions    Complete by:  As directed   DISCHARGE INSTRUCTIONS TO PATIENT  REMINDER:  Carry a list of your medications and allergies with you at all times Call your pharmacy at least 1 week in advance to refill  prescriptions Do not mix any prescribed pain medicine with alcohol Do not drive any motor vehicles while taking pain medication. Take medications with food.  Do not retake a pain medication if you vomit after taking it.  Activity: no lifting more than 15 pounds until instructed by your doctor.   Dressing Care Instruction (if applicable):              Remove operative dressings in 48 hours.  May Shower-  Call office if any questions regarding this activity.  Dry Dressing as needed to operative site.  Drain care instructions provided to you in the hospital.   Follow-up appointments (date to return to physician): Call for appointment with Dr. Sherri Rad, MD at [phone] or [phone]  If need MD on call after hours and on weekends call Hospital operator at [phone] as ask to speak to Surgeon on call for [facility].  Call Surgeon if you have: Temperature greater than 100.4 Persistent nausea and vomiting Severe uncontrolled pain Redness, tenderness, or signs of infection (pain, swelling, redness, odor or green/yellow discharge around the site) Difficulty breathing, headache or visual disturbances Hives Persistent dizziness or light-headedness Extreme fatigue Any other questions or concerns you may have after discharge  In an emergency, call 911 or go to an Emergency Department at a nearby hospital  Diet:  Resume your usual diet.  Avoid spicy, greasy or heavy foods.  If you have nausea or vomiting, go back to liquids.  If you cannot keep liquids down, call your doctor.  Avoid alcohol consumption while on prescription pain medications. Good nutrition promotes healing. Increase fiber and fluids.  I understand and acknowledge receipt of the above instructions.                                                                                                                                        [name] Signature                                                                     Date/Time                                                                                                                                        Physician's or R.N.'s Signature                                                                  Date/Time  The discharge instructions have been reviewed with the patient and/or Family Member/Parent/Guardian.  Patient and/or Family Member/Parent/Guardian signed and retained a printed copy.     Increase activity slowly    Complete by:  As directed      Remove dressing in 24 hours    Complete by:  As directed             Medication List    TAKE these medications        cephALEXin 500 MG capsule  Commonly known as:  KEFLEX  Take 1 capsule (500 mg total) by mouth 4 (four) times daily.     insulin NPH-regular Human (70-30) 100 UNIT/ML injection  Commonly known as:  NOVOLIN 70/30  Inject 15 Units into the skin 2 (two) times daily with a meal.     lisinopril 10 MG tablet  Commonly known as:  PRINIVIL,ZESTRIL  Take 1 tablet (10 mg total) by mouth daily.     oxyCODONE 5 MG immediate release tablet  Commonly known as:  Oxy IR/ROXICODONE  Take 1 tablet (5 mg total) by mouth every 6 (six) hours as needed for moderate pain or breakthrough pain.           Follow-up Information    Follow up with Sherri Rad, MD.   Specialties:  Surgery, Radiology   Why:  For wound re-check   Contact information:   [address] [place] [address] [phone]       Signed: Sherri Rad 01/08/2015, 4:58 PM

## 2015-01-08 NOTE — Care Management (Signed)
Picked up patient medications from [facility] and carried to room. Novolin 70/30, Lisinapril 10mg .  Reviewed meds with patient. Witnessed by patient nurse, Villa Herb RN.

## 2015-01-08 NOTE — Discharge Instructions (Signed)
Appendicitis Appendicitis is when the appendix is swollen (inflamed). The inflammation can lead to developing a hole (perforation) and a collection of pus (abscess). CAUSES  There is not always an obvious cause of appendicitis. Sometimes it is caused by an obstruction in the appendix. The obstruction can be caused by:  A small, hard, pea-sized ball of stool (fecalith).  Enlarged lymph glands in the appendix. SYMPTOMS   Pain around your belly button (navel) that moves toward your lower right belly (abdomen). The pain can become more severe and sharp as time passes.  Tenderness in the lower right abdomen. Pain gets worse if you cough or make a sudden movement.  Feeling sick to your stomach (nauseous).  Throwing up (vomiting).  Loss of appetite.  Fever.  Constipation.  Diarrhea.  Generally not feeling well. DIAGNOSIS   Physical exam.  Blood tests.  Urine test.  X-rays or a CT scan may confirm the diagnosis. TREATMENT  Once the diagnosis of appendicitis is made, the most common treatment is to remove the appendix as soon as possible. This procedure is called appendectomy. In an open appendectomy, a cut (incision) is made in the lower right abdomen and the appendix is removed. In a laparoscopic appendectomy, usually 3 small incisions are made. Long, thin instruments and a camera tube are used to remove the appendix. Most patients go home in 24 to 48 hours after appendectomy. In some situations, the appendix may have already perforated and an abscess may have formed. The abscess may have a "wall" around it as seen on a CT scan. In this case, a drain may be placed into the abscess to remove fluid, and you may be treated with antibiotic medicines that kill germs. The medicine is given through a tube in your vein (IV). Once the abscess has resolved, it may or may not be necessary to have an appendectomy. You may need to stay in the hospital longer than 48 hours.   This information is  not intended to replace advice given to you by your health care provider. Make sure you discuss any questions you have with your health care provider.   Document Released: 02/08/2005 Document Revised: 08/10/2011 Document Reviewed: 06/26/2014 Elsevier Interactive Patient Education [employer].

## 2015-01-09 ENCOUNTER — Telehealth: Payer: Self-pay

## 2015-01-09 LAB — SURGICAL PATHOLOGY

## 2015-01-09 NOTE — Telephone Encounter (Signed)
Post-discharge call made to patient at this time. No answer. Left voicemail for return phone call.

## 2015-01-09 NOTE — Telephone Encounter (Signed)
Patient returned [place] phone call saying he has not had a bowel movement or can't pass gas. His stomach is making a gurgling noise and is a little sore. He is also walking around the house and eating a little.

## 2015-01-10 NOTE — Telephone Encounter (Signed)
Returned phone call to patient at this time. Patient states that he has had a bowel movement this morning and is feeling much better. Denies nausea, vomiting, or fever at this time. Eating well and has no concerns at this time.  Post-op appointment made for this patient.  Encouraged to call with any questions or concerns.

## 2015-01-14 ENCOUNTER — Other Ambulatory Visit: Payer: Self-pay | Admitting: *Deleted

## 2015-01-14 NOTE — Anesthesia Postprocedure Evaluation (Signed)
Anesthesia Post Note  Patient: Brian [name]  Procedure(s) Performed: Procedure(s) (LRB): APPENDECTOMY LAPAROSCOPIC drainage of peritoneal abcess (N/A)  Patient location during evaluation: PACU Anesthesia Type: General Level of consciousness: awake and alert Pain management: pain level controlled Vital Signs Assessment: post-procedure vital signs reviewed and stable Respiratory status: spontaneous breathing, nonlabored ventilation, respiratory function stable and patient connected to nasal cannula oxygen Cardiovascular status: blood pressure returned to baseline and stable Postop Assessment: No signs of nausea or vomiting Anesthetic complications: no    Last Vitals:  Filed Vitals:   01/07/15 2151 01/08/15 0628  BP: 141/71 152/88  Pulse: 92 104  Temp: 37.3 C 37.2 C  Resp: 20 18    Last Pain:  Filed Vitals:   01/08/15 0812  PainSc: 2                  Molli Barrows

## 2015-01-15 ENCOUNTER — Encounter: Payer: Self-pay | Admitting: Surgery

## 2015-01-21 ENCOUNTER — Telehealth: Payer: Self-pay

## 2015-01-21 NOTE — Telephone Encounter (Signed)
Yesterday, we received a fax from [facility] requesting patient's medical records. Request will be placed in Media. I will fax medical records from 01/05/2015 when patient had surgery by Dr. Marina Gravel. If they need additional information, they should contact me.

## 2015-01-23 ENCOUNTER — Encounter: Payer: Self-pay | Admitting: Surgery

## 2015-01-23 ENCOUNTER — Ambulatory Visit (INDEPENDENT_AMBULATORY_CARE_PROVIDER_SITE_OTHER): Payer: Self-pay | Admitting: Surgery

## 2015-01-23 VITALS — BP 198/84 | HR 97 | Temp 97.0°F | Ht 69.0 in | Wt 291.0 lb

## 2015-01-23 DIAGNOSIS — K353 Acute appendicitis with localized peritonitis, without perforation or gangrene: Secondary | ICD-10-CM

## 2015-01-23 NOTE — Progress Notes (Signed)
54yr old male with multiple medical issues, s/p Lap appy.  Patient slowly getting energy back.  He is eating but gets full very quickly.  He is having good BMs and passing flatus.  He has lost about 10lbs since the operation.    Filed Vitals:   01/23/15 1603  BP: 198/84  Pulse: 97  Temp: 97 F (36.1 C)   PE:  Gen: NAD Abd: soft, nd,, nt with staples removed, incisions c/d/i  A/P: Doing well postop, slowly getting back to normal. Reviewed pathology of acute appendicitis.  He can call with issues or concerns.

## 2015-01-23 NOTE — Patient Instructions (Signed)
Please give [place] a call if you are experiencing any dizziness.  Please go to your appointment at [facility] on February 06, 2015 so they could refill your medications.

## 2015-01-28 ENCOUNTER — Encounter (INDEPENDENT_AMBULATORY_CARE_PROVIDER_SITE_OTHER): Payer: Self-pay

## 2015-01-28 ENCOUNTER — Ambulatory Visit: Payer: Self-pay

## 2015-02-13 ENCOUNTER — Encounter: Payer: Self-pay | Admitting: Pharmacist

## 2015-03-10 ENCOUNTER — Encounter: Payer: Self-pay | Admitting: Pharmacist

## 2016-02-06 ENCOUNTER — Telehealth: Payer: Self-pay | Admitting: Pharmacist

## 2016-02-06 NOTE — Telephone Encounter (Signed)
Faxed Novolin PAP to [facility]

## 2016-02-07 ENCOUNTER — Emergency Department: Payer: Medicaid Other

## 2016-02-07 ENCOUNTER — Encounter: Payer: Self-pay | Admitting: Emergency Medicine

## 2016-02-07 ENCOUNTER — Inpatient Hospital Stay
Admission: EM | Admit: 2016-02-07 | Discharge: 2016-02-09 | DRG: 871 | Disposition: A | Discharge status: Home / Self Care | Payer: Medicaid Other | Attending: Internal Medicine | Admitting: Internal Medicine

## 2016-02-07 DIAGNOSIS — N179 Acute kidney failure, unspecified: Secondary | ICD-10-CM | POA: Diagnosis present

## 2016-02-07 DIAGNOSIS — Z9049 Acquired absence of other specified parts of digestive tract: Secondary | ICD-10-CM

## 2016-02-07 DIAGNOSIS — J209 Acute bronchitis, unspecified: Secondary | ICD-10-CM | POA: Diagnosis present

## 2016-02-07 DIAGNOSIS — A419 Sepsis, unspecified organism: Principal | ICD-10-CM | POA: Diagnosis present

## 2016-02-07 DIAGNOSIS — Z8249 Family history of ischemic heart disease and other diseases of the circulatory system: Secondary | ICD-10-CM

## 2016-02-07 DIAGNOSIS — E872 Acidosis, unspecified: Secondary | ICD-10-CM

## 2016-02-07 DIAGNOSIS — J440 Chronic obstructive pulmonary disease with acute lower respiratory infection: Secondary | ICD-10-CM | POA: Diagnosis present

## 2016-02-07 DIAGNOSIS — E1165 Type 2 diabetes mellitus with hyperglycemia: Secondary | ICD-10-CM | POA: Diagnosis present

## 2016-02-07 DIAGNOSIS — E7800 Pure hypercholesterolemia, unspecified: Secondary | ICD-10-CM | POA: Diagnosis present

## 2016-02-07 DIAGNOSIS — Z23 Encounter for immunization: Secondary | ICD-10-CM

## 2016-02-07 DIAGNOSIS — J9602 Acute respiratory failure with hypercapnia: Secondary | ICD-10-CM | POA: Diagnosis present

## 2016-02-07 DIAGNOSIS — J189 Pneumonia, unspecified organism: Secondary | ICD-10-CM

## 2016-02-07 DIAGNOSIS — I129 Hypertensive chronic kidney disease with stage 1 through stage 4 chronic kidney disease, or unspecified chronic kidney disease: Secondary | ICD-10-CM | POA: Diagnosis present

## 2016-02-07 DIAGNOSIS — Z6841 Body Mass Index (BMI) 40.0 and over, adult: Secondary | ICD-10-CM

## 2016-02-07 DIAGNOSIS — N189 Chronic kidney disease, unspecified: Secondary | ICD-10-CM | POA: Diagnosis present

## 2016-02-07 DIAGNOSIS — E1122 Type 2 diabetes mellitus with diabetic chronic kidney disease: Secondary | ICD-10-CM | POA: Diagnosis present

## 2016-02-07 DIAGNOSIS — J449 Chronic obstructive pulmonary disease, unspecified: Secondary | ICD-10-CM | POA: Diagnosis present

## 2016-02-07 DIAGNOSIS — Z79899 Other long term (current) drug therapy: Secondary | ICD-10-CM

## 2016-02-07 DIAGNOSIS — Z794 Long term (current) use of insulin: Secondary | ICD-10-CM

## 2016-02-07 DIAGNOSIS — R6520 Severe sepsis without septic shock: Secondary | ICD-10-CM | POA: Diagnosis present

## 2016-02-07 DIAGNOSIS — Z886 Allergy status to analgesic agent status: Secondary | ICD-10-CM

## 2016-02-07 DIAGNOSIS — F17210 Nicotine dependence, cigarettes, uncomplicated: Secondary | ICD-10-CM | POA: Diagnosis present

## 2016-02-07 DIAGNOSIS — J690 Pneumonitis due to inhalation of food and vomit: Secondary | ICD-10-CM | POA: Diagnosis present

## 2016-02-07 DIAGNOSIS — J9601 Acute respiratory failure with hypoxia: Secondary | ICD-10-CM

## 2016-02-07 DIAGNOSIS — J441 Chronic obstructive pulmonary disease with (acute) exacerbation: Secondary | ICD-10-CM

## 2016-02-07 LAB — CBC WITH DIFFERENTIAL/PLATELET
BASOS ABS: 0.2 10*3/uL — AB (ref 0–0.1)
BASOS PCT: 1 %
EOS ABS: 0.8 10*3/uL — AB (ref 0–0.7)
EOS PCT: 5 %
HCT: 39.2 % — ABNORMAL LOW (ref 40.0–52.0)
HEMOGLOBIN: 13.2 g/dL (ref 13.0–18.0)
Lymphocytes Relative: 33 %
Lymphs Abs: 5.4 10*3/uL — ABNORMAL HIGH (ref 1.0–3.6)
MCH: 30.1 pg (ref 26.0–34.0)
MCHC: 33.6 g/dL (ref 32.0–36.0)
MCV: 89.6 fL (ref 80.0–100.0)
Monocytes Absolute: 1 10*3/uL (ref 0.2–1.0)
Monocytes Relative: 6 %
NEUTROS PCT: 55 %
Neutro Abs: 8.9 10*3/uL — ABNORMAL HIGH (ref 1.4–6.5)
PLATELETS: 284 10*3/uL (ref 150–440)
RBC: 4.38 MIL/uL — AB (ref 4.40–5.90)
RDW: 14.6 % — ABNORMAL HIGH (ref 11.5–14.5)
WBC: 16.3 10*3/uL — AB (ref 3.8–10.6)

## 2016-02-07 LAB — BLOOD GAS, VENOUS
ACID-BASE DEFICIT: 11.1 mmol/L — AB (ref 0.0–2.0)
Bicarbonate: 19.2 mmol/L — ABNORMAL LOW (ref 20.0–28.0)
Delivery systems: POSITIVE
FIO2: 0.6
O2 SAT: 64.7 %
PCO2 VEN: 62 mmHg — AB (ref 44.0–60.0)
PH VEN: 7.1 — AB (ref 7.250–7.430)
PO2 VEN: 47 mmHg — AB (ref 32.0–45.0)
Patient temperature: 37

## 2016-02-07 LAB — COMPREHENSIVE METABOLIC PANEL
ALK PHOS: 102 U/L (ref 38–126)
ALT: 17 U/L (ref 17–63)
AST: 20 U/L (ref 15–41)
Albumin: 3 g/dL — ABNORMAL LOW (ref 3.5–5.0)
Anion gap: 11 (ref 5–15)
BILIRUBIN TOTAL: 0.3 mg/dL (ref 0.3–1.2)
BUN: 49 mg/dL — AB (ref 6–20)
CO2: 16 mmol/L — ABNORMAL LOW (ref 22–32)
CREATININE: 2.43 mg/dL — AB (ref 0.61–1.24)
Calcium: 8.2 mg/dL — ABNORMAL LOW (ref 8.9–10.3)
Chloride: 105 mmol/L (ref 101–111)
GFR calc Af Amer: 33 mL/min — ABNORMAL LOW (ref >60)
GFR, EST NON AFRICAN AMERICAN: 28 mL/min — AB (ref >60)
GLUCOSE: 500 mg/dL — AB (ref 65–99)
Potassium: 4.6 mmol/L (ref 3.5–5.1)
Sodium: 132 mmol/L — ABNORMAL LOW (ref 135–145)
TOTAL PROTEIN: 7.4 g/dL (ref 6.5–8.1)

## 2016-02-07 LAB — TROPONIN I: TROPONIN I: 0.2 ng/mL — AB (ref <0.03)

## 2016-02-07 LAB — MAGNESIUM: Magnesium: 1.7 mg/dL (ref 1.7–2.4)

## 2016-02-07 MED ORDER — IPRATROPIUM-ALBUTEROL 0.5-2.5 (3) MG/3ML IN SOLN
3.0000 mL | Freq: Once | RESPIRATORY_TRACT | Status: AC
  Administered 2016-02-07: 3 mL via RESPIRATORY_TRACT
  Filled 2016-02-07: qty 3

## 2016-02-07 MED ORDER — CEFEPIME-DEXTROSE 2 GM/50ML IV SOLR
2.0000 g | Freq: Once | INTRAVENOUS | Status: AC
  Administered 2016-02-08: 2 g via INTRAVENOUS
  Filled 2016-02-07: qty 50

## 2016-02-07 MED ORDER — MAGNESIUM SULFATE 2 GM/50ML IV SOLN
2.0000 g | Freq: Once | INTRAVENOUS | Status: AC
  Administered 2016-02-07: 2 g via INTRAVENOUS
  Filled 2016-02-07: qty 50

## 2016-02-07 MED ORDER — SODIUM CHLORIDE 0.9 % IV BOLUS (SEPSIS)
500.0000 mL | Freq: Once | INTRAVENOUS | Status: AC
  Administered 2016-02-07: 500 mL via INTRAVENOUS

## 2016-02-07 MED ORDER — VANCOMYCIN HCL 10 G IV SOLR
2000.0000 mg | Freq: Once | INTRAVENOUS | Status: AC
  Administered 2016-02-07: 2000 mg via INTRAVENOUS
  Filled 2016-02-07: qty 2000

## 2016-02-07 MED ORDER — METHYLPREDNISOLONE SODIUM SUCC 125 MG IJ SOLR
125.0000 mg | Freq: Once | INTRAMUSCULAR | Status: AC
  Administered 2016-02-07: 125 mg via INTRAVENOUS

## 2016-02-07 NOTE — ED Notes (Signed)
2 sets of cultures sent down along with new rainbow ( red, green,blue) and recollect of BNP and also lactic

## 2016-02-07 NOTE — ED Triage Notes (Signed)
Per EMS, patient was pulled over on the shoulder of the road with his family and patient was very SOB.  EMS dispatched to area and started him on CPAP d/t his "moving very little air"  Pt has hx of COPD and smokes a ppd.   Pt was low 60's when EMS got there and went up to mid 59'Y after CPAP application and he was also given 2 duonebs which bumped him up to 97%

## 2016-02-07 NOTE — ED Provider Notes (Signed)
[facility] Emergency Department Provider Note    First MD Initiated Contact with Patient 02/07/16 2240     (approximate)  I have reviewed the triage vital signs and the nursing notes.   HISTORY  Chief Complaint Shortness of Breath  Level V Caveat:  Respiratory diostress  HPI Brian [name] is a 55 y.o. male history diabetes presents with acute respiratory distress. According to family patient was otherwise doing well, went to smoke a cigarette of his walk around outside when he caught his wife saying that he couldn't breathe. EMS was called and patient was markedly to An respiratory distress with saturations in the low 60s. Reportedly moving very little air. He is put on CPAP and received 2 DuoNeb nebs with a pulse ox able to increased to the mid 80s. Patient denies any chest pain. No abdominal pain. Denies any recent fevers but has had a nonproductive cough and chills. No lower extremity swelling.   Past Medical History:  Diagnosis Date  . Diabetes mellitus type 2 in obese ([place])   . DM (diabetes mellitus) type II controlled with renal manifestation ([place])   . Hypercholesteremia   . Hypertension   . Morbid obesity with BMI of 45.0-49.9, adult ([place])   . Neuropathy ([place])   . Pancreatitis, acute    Family History  Problem Relation Age of Onset  . Hypertension Mother   . Hyperlipidemia Mother   . Heart disease Father   . Heart disease Maternal Grandfather    Past Surgical History:  Procedure Laterality Date  . AMPUTATION    . CHOLECYSTECTOMY  1998  . LAPAROSCOPIC APPENDECTOMY N/A 01/06/2015   Procedure: APPENDECTOMY LAPAROSCOPIC drainage of peritoneal abcess;  Surgeon: Sherri Rad, MD;  Location: [facility] ORS;  Service: General;  Laterality: N/A;   Patient Active Problem List   Diagnosis Date Noted  . DM (diabetes mellitus) type II controlled with renal manifestation ([place]) 01/05/2015  . Pancreatitis, acute   . Neuropathy ([place])   . Hypercholesteremia    . Clinical depression 01/17/2013  . HLD (hyperlipidemia) 01/17/2013  . BP (high blood pressure) 01/17/2013  . Gonalgia 01/17/2013  . Morbid obesity ([place]) 01/17/2013  . Compulsive tobacco user syndrome 01/17/2013  . Amputated toe ([place]) 01/17/2013  . Diabetes mellitus ([place]) 01/17/2013      Prior to Admission medications   Medication Sig Start Date End Date Taking? Authorizing Provider  insulin NPH-regular Human (NOVOLIN 70/30) (70-30) 100 UNIT/ML injection Inject 15 Units into the skin 2 (two) times daily with a meal. 01/08/15   Loletha Grayer, MD  lisinopril (PRINIVIL,ZESTRIL) 20 MG tablet Take 20 mg by mouth daily.    Historical Provider, MD  oxyCODONE (OXY IR/ROXICODONE) 5 MG immediate release tablet Take 1 tablet (5 mg total) by mouth every 6 (six) hours as needed for moderate pain or breakthrough pain. 01/08/15   Sherri Rad, MD    Allergies Naproxen    Social History Social History  Substance Use Topics  . Smoking status: Current Every Day Smoker    Packs/day: 1.00    Types: Cigarettes  . Smokeless tobacco: Never Used  . Alcohol use No    Review of Systems Patient denies headaches, rhinorrhea, blurry vision, numbness, shortness of breath, chest pain, edema, cough, abdominal pain, nausea, vomiting, diarrhea, dysuria, fevers, rashes or hallucinations unless otherwise stated above in HPI. ____________________________________________   PHYSICAL EXAM:  VITAL SIGNS: Vitals:   02/07/16 2330 02/08/16 0000  BP: (!) 100/56 118/61  Pulse: (!) 110 (!)  110  Resp: (!) 25 (!) 24  Temp:      Constitutional: Critically ill appearing in acuterespiratory distress Eyes: Conjunctivae are normal. PERRL. EOMI. Head: Atraumatic. Nose: No congestion/rhinnorhea. Mouth/Throat: Mucous membranes are dry.  Oropharynx non-erythematous. Neck: No stridor. Painless ROM. No cervical spine tenderness to palpation Hematological/Lymphatic/Immunilogical: No cervical  lymphadenopathy. Cardiovascular: Tachycardic l rate, regular rhythm. Grossly normal heart sounds.  Good peripheral circulation. Respiratory: Acute respiratory distress with use of accessory muscles, prolonged expiratory phase and diffuse wheezing with rhonchorous breath sounds in the right lung fields. Gastrointestinal: Soft and nontender. No distention. No abdominal bruits. No CVA tenderness.  Musculoskeletal: No lower extremity tenderness nor edema.  No joint effusions. Neurologic: Limited due to acute respiratory distress Skin:  Skin is warm, dry and intact. No rash noted. .  ____________________________________________   LABS (all labs ordered are listed, but only abnormal results are displayed)  Results for orders placed or performed during the hospital encounter of 02/07/16 (from the past 24 hour(s))  CBC with Differential/Platelet     Status: Abnormal   Collection Time: 02/07/16 10:40 PM  Result Value Ref Range   WBC 16.3 (H) 3.8 - 10.6 K/uL   RBC 4.38 (L) 4.40 - 5.90 MIL/uL   Hemoglobin 13.2 13.0 - 18.0 g/dL   HCT 39.2 (L) 40.0 - 52.0 %   MCV 89.6 80.0 - 100.0 fL   MCH 30.1 26.0 - 34.0 pg   MCHC 33.6 32.0 - 36.0 g/dL   RDW 14.6 (H) 11.5 - 14.5 %   Platelets 284 150 - 440 K/uL   Neutrophils Relative % 55 %   Neutro Abs 8.9 (H) 1.4 - 6.5 K/uL   Lymphocytes Relative 33 %   Lymphs Abs 5.4 (H) 1.0 - 3.6 K/uL   Monocytes Relative 6 %   Monocytes Absolute 1.0 0.2 - 1.0 K/uL   Eosinophils Relative 5 %   Eosinophils Absolute 0.8 (H) 0 - 0.7 K/uL   Basophils Relative 1 %   Basophils Absolute 0.2 (H) 0 - 0.1 K/uL  Comprehensive metabolic panel     Status: Abnormal   Collection Time: 02/07/16 10:40 PM  Result Value Ref Range   Sodium 132 (L) 135 - 145 mmol/L   Potassium 4.6 3.5 - 5.1 mmol/L   Chloride 105 101 - 111 mmol/L   CO2 16 (L) 22 - 32 mmol/L   Glucose, Bld 500 (H) 65 - 99 mg/dL   BUN 49 (H) 6 - 20 mg/dL   Creatinine, Ser 2.43 (H) 0.61 - 1.24 mg/dL   Calcium 8.2 (L)  8.9 - 10.3 mg/dL   Total Protein 7.4 6.5 - 8.1 g/dL   Albumin 3.0 (L) 3.5 - 5.0 g/dL   AST 20 15 - 41 U/L   ALT 17 17 - 63 U/L   Alkaline Phosphatase 102 38 - 126 U/L   Total Bilirubin 0.3 0.3 - 1.2 mg/dL   GFR calc non Af Amer 28 (L) >60 mL/min   GFR calc Af Amer 33 (L) >60 mL/min   Anion gap 11 5 - 15  Troponin I     Status: Abnormal   Collection Time: 02/07/16 10:40 PM  Result Value Ref Range   Troponin I 0.20 (HH) <0.03 ng/mL  Magnesium     Status: None   Collection Time: 02/07/16 10:40 PM  Result Value Ref Range   Magnesium 1.7 1.7 - 2.4 mg/dL  Blood gas, venous     Status: Abnormal   Collection Time: 02/07/16 10:50 PM  Result Value Ref Range   FIO2 0.60    Delivery systems BILEVEL POSITIVE AIRWAY PRESSURE    pH, Ven 7.10 (LL) 7.250 - 7.430   pCO2, Ven 62 (H) 44.0 - 60.0 mmHg   pO2, Ven 47.0 (H) 32.0 - 45.0 mmHg   Bicarbonate 19.2 (L) 20.0 - 28.0 mmol/L   Acid-base deficit 11.1 (H) 0.0 - 2.0 mmol/L   O2 Saturation 64.7 %   Patient temperature 37.0    Collection site LINE    Sample type VENOUS   Brain natriuretic peptide     Status: Abnormal   Collection Time: 02/07/16 11:41 PM  Result Value Ref Range   B Natriuretic Peptide 111.0 (H) 0.0 - 100.0 pg/mL   ____________________________________________  EKG My review and personal interpretation at Time: 22:56   Indication: sob  Rate: 112  Rhythm: sinus tach Axis: normal Other: st depressions  <79mm in inferolateral distribution, no STEMI ____________________________________________  RADIOLOGY  I personally reviewed all radiographic images ordered to evaluate for the above acute complaints and reviewed radiology reports and findings.  These findings were personally discussed with the patient.  Please see medical record for radiology report. ____________________________________________   PROCEDURES  Procedure(s) performed: none Procedures    Critical Care performed: yes CRITICAL CARE Performed by: Brian [name]   Total critical care time: 48 minutes  Critical care time was exclusive of separately billable procedures and treating other patients.  Critical care was necessary to treat or prevent imminent or life-threatening deterioration.  Critical care was time spent personally by me on the following activities: development of treatment plan with patient and/or surrogate as well as nursing, discussions with consultants, evaluation of patient's response to treatment, examination of patient, obtaining history from patient or surrogate, ordering and performing treatments and interventions, ordering and review of laboratory studies, ordering and review of radiographic studies, pulse oximetry and re-evaluation of patient's condition.  ____________________________________________   INITIAL IMPRESSION / ASSESSMENT AND PLAN / ED COURSE  Pertinent labs & imaging results that were available during my care of the patient were reviewed by me and considered in my medical decision making (see chart for details).  DDX:   OSHER OETTINGER is a 55 y.o. who presents to the ED with acute respiratory distress. Vision placed on BiPAP immediately with improvement in his pulse ox.  Exam concerning for COPD versus CHF. Patient initially afebrile but also possible pneumonia. Denies any chest pain at this time. EKG does show some ST depressions but no ST elevation. Likely demand ischemia given his acute hypoxia and tachycardia. Symptoms do seem to be improving after BiPAP as well as nebulizer treatment started by EMS. Will start on IV magnesium as well as Solu-Medrol. Based on his acute hypoxia will start on empiric antibiotics.  The patient will be placed on continuous pulse oximetry and telemetry for monitoring.  Laboratory evaluation will be sent to evaluate for the above complaints.     Clinical Course as of Feb 08 20  Sat Feb 07, 2016  2327 Patient states he is feeling better on bipap.  Denies any chest pain  or pressure.   [PR]  2334 Patient with evidence of RLL pna and underlying COPD.  Will continue to monitor and treat both.  Troponin elevation likely 2/2 demand ischemia given underlying sepsis.  Patient receiving broadspectrum abx.  Evidence of acute metabolic acidosis.  Also with evidence of AKI.    [PR]  Sun Feb 08, 2016  0015 Patient reassessed.  Given a  brief break from Bipap as he appeared to have improved.  Desaturated with increased work of breathing within <1 minute to 87%.  Placed back on Bipap.  I spoke with the critical care service who agrees to accept patient to the ICU for further evaluation and monitoring. [PR]    Clinical Course User Index [PR] Brian Lot, MD     ____________________________________________   FINAL CLINICAL IMPRESSION(S) / ED DIAGNOSES  Final diagnoses:  Acute respiratory failure with hypoxia ([place])  Metabolic acidosis  Aspiration pneumonia of right lower lobe, unspecified aspiration pneumonia type ([place])  COPD exacerbation ([place])  Severe sepsis ([place])      NEW MEDICATIONS STARTED DURING THIS VISIT:  New Prescriptions   No medications on file     Note:  This document was prepared using Dragon voice recognition software and may include unintentional dictation errors.    Brian Lot, MD 02/08/16 [phone]

## 2016-02-07 NOTE — ED Notes (Signed)
XR at bedside

## 2016-02-07 NOTE — ED Notes (Addendum)
This nurse notified by lab; Pt's troponin is 0.20; MD notified

## 2016-02-08 DIAGNOSIS — E1165 Type 2 diabetes mellitus with hyperglycemia: Secondary | ICD-10-CM | POA: Diagnosis present

## 2016-02-08 DIAGNOSIS — J181 Lobar pneumonia, unspecified organism: Secondary | ICD-10-CM

## 2016-02-08 DIAGNOSIS — J9602 Acute respiratory failure with hypercapnia: Secondary | ICD-10-CM | POA: Diagnosis present

## 2016-02-08 DIAGNOSIS — J9601 Acute respiratory failure with hypoxia: Secondary | ICD-10-CM | POA: Diagnosis present

## 2016-02-08 DIAGNOSIS — A419 Sepsis, unspecified organism: Secondary | ICD-10-CM | POA: Diagnosis present

## 2016-02-08 DIAGNOSIS — F17210 Nicotine dependence, cigarettes, uncomplicated: Secondary | ICD-10-CM | POA: Diagnosis present

## 2016-02-08 DIAGNOSIS — R6520 Severe sepsis without septic shock: Secondary | ICD-10-CM | POA: Diagnosis present

## 2016-02-08 DIAGNOSIS — E872 Acidosis: Secondary | ICD-10-CM | POA: Diagnosis present

## 2016-02-08 DIAGNOSIS — J209 Acute bronchitis, unspecified: Secondary | ICD-10-CM | POA: Diagnosis present

## 2016-02-08 DIAGNOSIS — J9621 Acute and chronic respiratory failure with hypoxia: Secondary | ICD-10-CM | POA: Diagnosis not present

## 2016-02-08 DIAGNOSIS — N179 Acute kidney failure, unspecified: Secondary | ICD-10-CM | POA: Diagnosis present

## 2016-02-08 DIAGNOSIS — J449 Chronic obstructive pulmonary disease, unspecified: Secondary | ICD-10-CM | POA: Diagnosis present

## 2016-02-08 DIAGNOSIS — E1122 Type 2 diabetes mellitus with diabetic chronic kidney disease: Secondary | ICD-10-CM | POA: Diagnosis present

## 2016-02-08 DIAGNOSIS — Z886 Allergy status to analgesic agent status: Secondary | ICD-10-CM | POA: Diagnosis not present

## 2016-02-08 DIAGNOSIS — Z79899 Other long term (current) drug therapy: Secondary | ICD-10-CM | POA: Diagnosis not present

## 2016-02-08 DIAGNOSIS — Z6841 Body Mass Index (BMI) 40.0 and over, adult: Secondary | ICD-10-CM | POA: Diagnosis not present

## 2016-02-08 DIAGNOSIS — I129 Hypertensive chronic kidney disease with stage 1 through stage 4 chronic kidney disease, or unspecified chronic kidney disease: Secondary | ICD-10-CM | POA: Diagnosis present

## 2016-02-08 DIAGNOSIS — R0602 Shortness of breath: Secondary | ICD-10-CM | POA: Diagnosis not present

## 2016-02-08 DIAGNOSIS — E7800 Pure hypercholesterolemia, unspecified: Secondary | ICD-10-CM | POA: Diagnosis present

## 2016-02-08 DIAGNOSIS — J440 Chronic obstructive pulmonary disease with acute lower respiratory infection: Secondary | ICD-10-CM | POA: Diagnosis present

## 2016-02-08 DIAGNOSIS — J9622 Acute and chronic respiratory failure with hypercapnia: Secondary | ICD-10-CM

## 2016-02-08 DIAGNOSIS — J441 Chronic obstructive pulmonary disease with (acute) exacerbation: Secondary | ICD-10-CM | POA: Diagnosis present

## 2016-02-08 DIAGNOSIS — R6521 Severe sepsis with septic shock: Secondary | ICD-10-CM

## 2016-02-08 DIAGNOSIS — Z794 Long term (current) use of insulin: Secondary | ICD-10-CM | POA: Diagnosis not present

## 2016-02-08 DIAGNOSIS — J690 Pneumonitis due to inhalation of food and vomit: Secondary | ICD-10-CM | POA: Diagnosis present

## 2016-02-08 DIAGNOSIS — N189 Chronic kidney disease, unspecified: Secondary | ICD-10-CM | POA: Diagnosis present

## 2016-02-08 DIAGNOSIS — Z8249 Family history of ischemic heart disease and other diseases of the circulatory system: Secondary | ICD-10-CM | POA: Diagnosis not present

## 2016-02-08 DIAGNOSIS — Z9049 Acquired absence of other specified parts of digestive tract: Secondary | ICD-10-CM | POA: Diagnosis not present

## 2016-02-08 DIAGNOSIS — Z23 Encounter for immunization: Secondary | ICD-10-CM | POA: Diagnosis not present

## 2016-02-08 LAB — GLUCOSE, CAPILLARY
GLUCOSE-CAPILLARY: 523 mg/dL — AB (ref 65–99)
GLUCOSE-CAPILLARY: 446 mg/dL — AB (ref 65–99)
GLUCOSE-CAPILLARY: 449 mg/dL — AB (ref 65–99)
GLUCOSE-CAPILLARY: 403 mg/dL — AB (ref 65–99)
GLUCOSE-CAPILLARY: 316 mg/dL — AB (ref 65–99)
GLUCOSE-CAPILLARY: 240 mg/dL — AB (ref 65–99)
GLUCOSE-CAPILLARY: 195 mg/dL — AB (ref 65–99)
GLUCOSE-CAPILLARY: 113 mg/dL — AB (ref 65–99)
GLUCOSE-CAPILLARY: 117 mg/dL — AB (ref 65–99)
GLUCOSE-CAPILLARY: 172 mg/dL — AB (ref 65–99)
GLUCOSE-CAPILLARY: 171 mg/dL — AB (ref 65–99)
Glucose-Capillary: 130 mg/dL — ABNORMAL HIGH (ref 65–99)
Glucose-Capillary: 180 mg/dL — ABNORMAL HIGH (ref 65–99)
Glucose-Capillary: 211 mg/dL — ABNORMAL HIGH (ref 65–99)
Glucose-Capillary: 231 mg/dL — ABNORMAL HIGH (ref 65–99)
Glucose-Capillary: 244 mg/dL — ABNORMAL HIGH (ref 65–99)
Glucose-Capillary: 277 mg/dL — ABNORMAL HIGH (ref 65–99)
Glucose-Capillary: 355 mg/dL — ABNORMAL HIGH (ref 65–99)
Glucose-Capillary: 372 mg/dL — ABNORMAL HIGH (ref 65–99)
Glucose-Capillary: 488 mg/dL — ABNORMAL HIGH (ref 65–99)
Glucose-Capillary: 512 mg/dL (ref 65–99)
Glucose-Capillary: 535 mg/dL (ref 65–99)
Glucose-Capillary: >600 mg/dL (ref 65–99)
Glucose-Capillary: >600 mg/dL (ref 65–99)

## 2016-02-08 LAB — BLOOD GAS, ARTERIAL
Acid-base deficit: 9.2 mmol/L — ABNORMAL HIGH (ref 0.0–2.0)
BICARBONATE: 17 mmol/L — AB (ref 20.0–28.0)
Delivery systems: POSITIVE
EXPIRATORY PAP: 8
FIO2: 0.4
Inspiratory PAP: 12
O2 SAT: 95.3 %
PH ART: 7.27 — AB (ref 7.350–7.450)
PO2 ART: 88 mmHg (ref 83.0–108.0)
Patient temperature: 37
pCO2 arterial: 37 mmHg (ref 32.0–48.0)

## 2016-02-08 LAB — BASIC METABOLIC PANEL
ANION GAP: 6 (ref 5–15)
Anion gap: 7 (ref 5–15)
BUN: 51 mg/dL — ABNORMAL HIGH (ref 6–20)
BUN: 53 mg/dL — AB (ref 6–20)
CALCIUM: 8.2 mg/dL — AB (ref 8.9–10.3)
CALCIUM: 8.2 mg/dL — AB (ref 8.9–10.3)
CHLORIDE: 112 mmol/L — AB (ref 101–111)
CO2: 18 mmol/L — ABNORMAL LOW (ref 22–32)
CO2: 17 mmol/L — AB (ref 22–32)
CREATININE: 2.32 mg/dL — AB (ref 0.61–1.24)
CREATININE: 2.37 mg/dL — AB (ref 0.61–1.24)
Chloride: 107 mmol/L (ref 101–111)
GFR calc non Af Amer: 29 mL/min — ABNORMAL LOW (ref >60)
GFR, EST AFRICAN AMERICAN: 34 mL/min — AB (ref >60)
GFR, EST AFRICAN AMERICAN: 34 mL/min — AB (ref >60)
GFR, EST NON AFRICAN AMERICAN: 30 mL/min — AB (ref >60)
GLUCOSE: 222 mg/dL — AB (ref 65–99)
Glucose, Bld: 570 mg/dL (ref 65–99)
Potassium: 5.8 mmol/L — ABNORMAL HIGH (ref 3.5–5.1)
Potassium: 4.7 mmol/L (ref 3.5–5.1)
SODIUM: 131 mmol/L — AB (ref 135–145)
Sodium: 136 mmol/L (ref 135–145)

## 2016-02-08 LAB — MAGNESIUM: Magnesium: 1.9 mg/dL (ref 1.7–2.4)

## 2016-02-08 LAB — CBC
HCT: 34.9 % — ABNORMAL LOW (ref 40.0–52.0)
Hemoglobin: 11.9 g/dL — ABNORMAL LOW (ref 13.0–18.0)
MCH: 30.6 pg (ref 26.0–34.0)
MCHC: 34 g/dL (ref 32.0–36.0)
MCV: 89.9 fL (ref 80.0–100.0)
PLATELETS: 224 10*3/uL (ref 150–440)
RBC: 3.88 MIL/uL — ABNORMAL LOW (ref 4.40–5.90)
RDW: 14 % (ref 11.5–14.5)
WBC: 14.9 10*3/uL — AB (ref 3.8–10.6)

## 2016-02-08 LAB — MRSA PCR SCREENING: MRSA BY PCR: NEGATIVE

## 2016-02-08 LAB — LACTIC ACID, PLASMA
LACTIC ACID, VENOUS: 2 mmol/L — AB (ref 0.5–1.9)
LACTIC ACID, VENOUS: 2.1 mmol/L — AB (ref 0.5–1.9)

## 2016-02-08 LAB — INFLUENZA PANEL BY PCR (TYPE A & B)
Influenza A By PCR: NEGATIVE
Influenza B By PCR: NEGATIVE

## 2016-02-08 LAB — BRAIN NATRIURETIC PEPTIDE: B Natriuretic Peptide: 111 pg/mL — ABNORMAL HIGH (ref 0.0–100.0)

## 2016-02-08 LAB — PROCALCITONIN: PROCALCITONIN: 0.18 ng/mL

## 2016-02-08 MED ORDER — ALBUTEROL SULFATE (2.5 MG/3ML) 0.083% IN NEBU
5.0000 mg | INHALATION_SOLUTION | Freq: Once | RESPIRATORY_TRACT | Status: AC
  Administered 2016-02-08: 5 mg via RESPIRATORY_TRACT
  Filled 2016-02-08: qty 6

## 2016-02-08 MED ORDER — ORAL CARE MOUTH RINSE: 15.0000 mL | Freq: Two times a day (BID) | OROMUCOSAL | Status: DC

## 2016-02-08 MED ORDER — ONDANSETRON HCL 4 MG/2ML IJ SOLN: 4.0000 mg | Freq: Four times a day (QID) | INTRAMUSCULAR | Status: DC | PRN

## 2016-02-08 MED ORDER — METHYLPREDNISOLONE SODIUM SUCC 40 MG IJ SOLR
40.0000 mg | Freq: Three times a day (TID) | INTRAMUSCULAR | Status: DC
  Administered 2016-02-08: 40 mg via INTRAVENOUS
  Filled 2016-02-08: qty 1

## 2016-02-08 MED ORDER — SODIUM CHLORIDE 0.9 % IV SOLN
INTRAVENOUS | Status: DC
  Administered 2016-02-08: 4.5 [IU]/h via INTRAVENOUS
  Administered 2016-02-08: 17:00:00 via INTRAVENOUS
  Filled 2016-02-08 (×2): qty 2.5

## 2016-02-08 MED ORDER — SODIUM CHLORIDE 0.9 % IV SOLN
250.0000 mL | INTRAVENOUS | Status: DC | PRN
  Administered 2016-02-08: 1000 mL via INTRAVENOUS

## 2016-02-08 MED ORDER — LISINOPRIL 20 MG PO TABS
20.0000 mg | ORAL_TABLET | Freq: Every day | ORAL | Status: DC
  Administered 2016-02-08: 20 mg via ORAL
  Filled 2016-02-08: qty 1

## 2016-02-08 MED ORDER — LEVOFLOXACIN IN D5W 500 MG/100ML IV SOLN
500.0000 mg | INTRAVENOUS | Status: DC
  Administered 2016-02-08 – 2016-02-09 (×2): 500 mg via INTRAVENOUS
  Filled 2016-02-08 (×2): qty 100

## 2016-02-08 MED ORDER — HEPARIN SODIUM (PORCINE) 5000 UNIT/ML IJ SOLN
5000.0000 [IU] | Freq: Two times a day (BID) | INTRAMUSCULAR | Status: DC
  Administered 2016-02-08 – 2016-02-09 (×3): 5000 [IU] via SUBCUTANEOUS
  Filled 2016-02-08 (×3): qty 1

## 2016-02-08 MED ORDER — DEXTROSE 5 % IV SOLN
2.0000 g | Freq: Two times a day (BID) | INTRAVENOUS | Status: DC
  Administered 2016-02-08 – 2016-02-09 (×2): 2 g via INTRAVENOUS
  Filled 2016-02-08 (×3): qty 2

## 2016-02-08 MED ORDER — METHYLPREDNISOLONE SODIUM SUCC 40 MG IJ SOLR
40.0000 mg | INTRAMUSCULAR | Status: DC
  Administered 2016-02-09: 40 mg via INTRAVENOUS
  Filled 2016-02-08: qty 1

## 2016-02-08 MED ORDER — FAMOTIDINE IN NACL 20-0.9 MG/50ML-% IV SOLN
INTRAVENOUS | Status: AC
  Administered 2016-02-08: 20 mg via INTRAVENOUS
  Filled 2016-02-08: qty 50

## 2016-02-08 MED ORDER — LEVOFLOXACIN IN D5W 500 MG/100ML IV SOLN
INTRAVENOUS | Status: AC
  Administered 2016-02-08: 500 mg via INTRAVENOUS
  Filled 2016-02-08: qty 100

## 2016-02-08 MED ORDER — ATORVASTATIN CALCIUM 20 MG PO TABS
20.0000 mg | ORAL_TABLET | Freq: Every day | ORAL | Status: DC
  Administered 2016-02-08: 20 mg via ORAL
  Filled 2016-02-08: qty 1

## 2016-02-08 MED ORDER — SERTRALINE HCL 100 MG PO TABS
100.0000 mg | ORAL_TABLET | Freq: Every day | ORAL | Status: DC
  Administered 2016-02-08 – 2016-02-09 (×2): 100 mg via ORAL
  Filled 2016-02-08 (×2): qty 1

## 2016-02-08 MED ORDER — INSULIN ASPART 100 UNIT/ML ~~LOC~~ SOLN
SUBCUTANEOUS | Status: AC
  Filled 2016-02-08: qty 6

## 2016-02-08 MED ORDER — VANCOMYCIN HCL 10 G IV SOLR
1250.0000 mg | INTRAVENOUS | Status: DC
  Filled 2016-02-08: qty 1250

## 2016-02-08 MED ORDER — FAMOTIDINE IN NACL 20-0.9 MG/50ML-% IV SOLN
20.0000 mg | INTRAVENOUS | Status: DC
  Administered 2016-02-08 – 2016-02-09 (×2): 20 mg via INTRAVENOUS
  Filled 2016-02-08 (×2): qty 50

## 2016-02-08 MED ORDER — INSULIN DETEMIR 100 UNIT/ML ~~LOC~~ SOLN
15.0000 [IU] | Freq: Every day | SUBCUTANEOUS | Status: DC
  Administered 2016-02-08: 15 [IU] via SUBCUTANEOUS
  Filled 2016-02-08: qty 0.15

## 2016-02-08 MED ORDER — INSULIN ASPART 100 UNIT/ML ~~LOC~~ SOLN
2.0000 [IU] | Freq: Three times a day (TID) | SUBCUTANEOUS | Status: DC
  Administered 2016-02-08: 6 [IU] via SUBCUTANEOUS

## 2016-02-08 MED ORDER — INFLUENZA VAC SPLIT QUAD 0.5 ML IM SUSY
0.5000 mL | PREFILLED_SYRINGE | INTRAMUSCULAR | Status: AC
  Administered 2016-02-09: 0.5 mL via INTRAMUSCULAR
  Filled 2016-02-08: qty 0.5

## 2016-02-08 MED ORDER — CYCLOBENZAPRINE HCL 10 MG PO TABS
10.0000 mg | ORAL_TABLET | Freq: Every evening | ORAL | Status: DC | PRN
  Administered 2016-02-08: 10 mg via ORAL
  Filled 2016-02-08: qty 1

## 2016-02-08 MED ORDER — FUROSEMIDE 20 MG PO TABS
20.0000 mg | ORAL_TABLET | Freq: Every day | ORAL | Status: DC
Start: 2016-02-08 — End: 2016-02-09
  Administered 2016-02-08 – 2016-02-09 (×2): 20 mg via ORAL
  Filled 2016-02-08 (×2): qty 1

## 2016-02-08 MED ORDER — ACETAMINOPHEN 325 MG PO TABS: 650.0000 mg | ORAL_TABLET | ORAL | Status: DC | PRN

## 2016-02-08 MED ORDER — GABAPENTIN 300 MG PO CAPS
300.0000 mg | ORAL_CAPSULE | Freq: Three times a day (TID) | ORAL | Status: DC
  Administered 2016-02-08 – 2016-02-09 (×4): 300 mg via ORAL
  Filled 2016-02-08 (×4): qty 1

## 2016-02-08 MED ORDER — CHLORHEXIDINE GLUCONATE 0.12 % MT SOLN
15.0000 mL | Freq: Two times a day (BID) | OROMUCOSAL | Status: DC
  Administered 2016-02-08 (×2): 15 mL via OROMUCOSAL
  Filled 2016-02-08: qty 15

## 2016-02-08 NOTE — ED Notes (Signed)
Patient resting quietly in bed; NIPPV therapy in place per MD order. VSS; see VSFS. Patient easy to arouse to verbal stimulation; states, "I feel much better than I did 3 hours ago". Patient comfortable without noted increased WOB with NIPPV in place; when removed, patient becomes SOB and experiences difficulty speaking.

## 2016-02-08 NOTE — Progress Notes (Signed)
eLink Physician-Brief Progress Note Patient Name: Brian [name] DOB: 11-29-1960 MRN: [id number]   Date of Service  02/08/2016  HPI/Events of Note  Blood glucose = 535. Patient is now on Solumedrol.   eICU Interventions  Will order: 1. D/C Novolog SSI and Levimir insulin. 2. Insulin IV infusion.      Intervention Category Major Interventions: Hyperglycemia - active titration of insulin therapy  Sommer,Steven Eugene 02/08/2016, 3:26 AM

## 2016-02-08 NOTE — Progress Notes (Addendum)
ANTIBIOTIC CONSULT NOTE - FOLLOW UP   Pharmacy Consult for Cefepime  Indication: pneumonia  Allergies  Allergen Reactions  . Naproxen Rash    Patient Measurements: Height: 5\' 10"  (177.8 cm) Weight: (!) 322 lb 15.6 oz (146.5 kg) IBW/kg (Calculated) : 73 Adjusted Body Weight: 100.04 kg   Vital Signs: Temp: 98.2 F (36.8 C) (12/17 1200) Temp Source: Oral (12/17 1200) BP: 115/76 (12/17 1400) Pulse Rate: 100 (12/17 1400) Intake/Output from previous day: 12/16 0701 - 12/17 0700 In: 1562.1 [I.V.:312.1; IV Piggyback:1250] Out: -  Intake/Output from this shift: Total I/O In: 667.1 [I.V.:667.1] Out: 675 [Urine:675]  Labs:  Recent Labs  02/07/16 2240 02/08/16 0449  WBC 16.3* 14.9*  HGB 13.2 11.9*  PLT 284 224  CREATININE 2.43* 2.32*   Estimated Creatinine Clearance: 51.5 mL/min (by C-G formula based on SCr of 2.32 mg/dL (H)). No results for input(s): VANCOTROUGH, VANCOPEAK, VANCORANDOM, GENTTROUGH, GENTPEAK, GENTRANDOM, TOBRATROUGH, TOBRAPEAK, TOBRARND, AMIKACINPEAK, AMIKACINTROU, AMIKACIN in the last 72 hours.   Microbiology: Recent Results (from the past 720 hour(s))  Blood culture (routine x 2)     Status: None (Preliminary result)   Collection Time: 02/07/16 11:40 PM  Result Value Ref Range Status   Specimen Description BLOOD RIGHT HAND  Final   Special Requests   Final    BOTTLES DRAWN AEROBIC AND ANAEROBIC 11CCAERO,11CCANA   Culture NO GROWTH < 12 HOURS  Final   Report Status PENDING  Incomplete  Blood culture (routine x 2)     Status: None (Preliminary result)   Collection Time: 02/07/16 11:40 PM  Result Value Ref Range Status   Specimen Description BLOOD RIGHT ARM  Final   Special Requests   Final    BOTTLES DRAWN AEROBIC AND ANAEROBIC [place]   Culture NO GROWTH < 12 HOURS  Final   Report Status PENDING  Incomplete  MRSA PCR Screening     Status: None   Collection Time: 02/08/16  6:04 AM  Result Value Ref Range Status   MRSA by PCR NEGATIVE  NEGATIVE Final    Comment:        The GeneXpert MRSA Assay (FDA approved for NASAL specimens only), is one component of a comprehensive MRSA colonization surveillance program. It is not intended to diagnose MRSA infection nor to guide or monitor treatment for MRSA infections.     Medical History: Past Medical History:  Diagnosis Date  . Diabetes mellitus type 2 in obese ([place])   . DM (diabetes mellitus) type II controlled with renal manifestation ([place])   . Hypercholesteremia   . Hypertension   . Morbid obesity with BMI of 45.0-49.9, adult ([place])   . Neuropathy ([place])   . Pancreatitis, acute     Medications:  Prescriptions Prior to Admission  Medication Sig Dispense Refill Last Dose  . atorvastatin (LIPITOR) 20 MG tablet Take 20 mg by mouth daily.   02/07/2016 at Unknown time  . furosemide (LASIX) 20 MG tablet Take 20 mg by mouth daily.   02/07/2016 at Unknown time  . gabapentin (NEURONTIN) 300 MG capsule Take 600 mg by mouth 3 (three) times daily.     . insulin NPH-regular Human (NOVOLIN 70/30) (70-30) 100 UNIT/ML injection Inject 15 Units into the skin 2 (two) times daily with a meal. 10 mL 2 02/07/2016 at Unknown time  . lisinopril (PRINIVIL,ZESTRIL) 20 MG tablet Take 20 mg by mouth daily.   02/07/2016 at Unknown time  . metFORMIN (GLUCOPHAGE) 1000 MG tablet Take 1,000 mg by mouth 2 (two) times  daily.     . sertraline (ZOLOFT) 100 MG tablet Take 100 mg by mouth daily.       Will start Cefepime 2 g IV q12 hours.    Shereena Berquist D 02/08/2016,2:53 PM

## 2016-02-08 NOTE — ED Notes (Signed)
Dr. Ashby Dawes to bedside to see and assess patient in the ED at this time. MD aware that CBG critical high; RN to proceed with SSI and Levemir as ordered. MD aware that we are awaiting bed assignment at this time prior to moving patient to CCU.

## 2016-02-08 NOTE — ED Notes (Addendum)
Patient taken off NIPPV therapy to move from ED stretcher to hospital bed. (+) rapid desaturation noted into the low 80s; (+) increased WOB noted AEB use of accessory muscles and increased RR back into the mid 30s. MD aware. NIPPV restarted and sats improved to 99%.

## 2016-02-08 NOTE — H&P (Signed)
[facility] Consultation     ASSESSMENT/PLAN    PULMONARY A-:Acute hypoxic and hypercapnic respiratory failure with acute bronchitis, likely due to AECOPD and pneumonia.  --CXR images reviewed; c/w RLL pneumonia.   CARDIOVASCULAR A:  Essential HTN.  P:  Monitor and rx BP as needed.   RENAL A:  AKI secondary to sepsis.  Continue IVF.   GASTROINTESTINAL A: Famotidine for GI prophylaxis.   HEMATOLOGIC A:  *Leukocytosis secondary to above.   INFECTIOUS A: Pneumonia with sepsis.  P:    Micro/culture results:  BCx2 -- UC -- Sputum-- Influenza pending.  Procalcitonin pending.   Antibiotics: Vancomycin 12/17>> Levaquin 12/17>>   ENDOCRINE A:  Diabetes   P:   Levemir and SSI.    NEUROLOGIC A:  -- P:   RASS goal: -- --   MAJOR EVENTS/TEST RESULTS:   Best Practices  DVT Prophylaxis: Heparin GI Prophylaxis: famotidine.    ---------------------------------------  ---------------------------------------   Name: Brian [name] MRN: [id number] DOB: June 22, 1960    ADMISSION DATE:  02/07/2016   CHIEF COMPLAINT: Dyspnea   HISTORY OF PRESENT ILLNESS:   Brian [name] is a 55 y.o. male history diabetes presents with acute respiratory distress. According to family patient was otherwise doing well, went to smoke a cigarette of his walk around outside when he caught his wife saying that he couldn't breathe. EMS was called and patient was markedly to An respiratory distress with saturations in the low 60s. Reportedly moving very little air. He is put on CPAP and received 2 DuoNeb nebs with a pulse ox able to increased to the mid 80s. Patient denies any chest pain. No abdominal pain. Denies any recent fevers but has had a nonproductive cough and chills. No lower extremity swelling.  PAST MEDICAL HISTORY :  Past Medical History:  Diagnosis Date  . Diabetes mellitus type 2 in obese ([place])   . DM (diabetes mellitus) type II controlled  with renal manifestation ([place])   . Hypercholesteremia   . Hypertension   . Morbid obesity with BMI of 45.0-49.9, adult ([place])   . Neuropathy ([place])   . Pancreatitis, acute    Past Surgical History:  Procedure Laterality Date  . AMPUTATION    . CHOLECYSTECTOMY  1998  . LAPAROSCOPIC APPENDECTOMY N/A 01/06/2015   Procedure: APPENDECTOMY LAPAROSCOPIC drainage of peritoneal abcess;  Surgeon: Sherri Rad, MD;  Location: [facility] ORS;  Service: General;  Laterality: N/A;   Prior to Admission medications   Medication Sig Start Date End Date Taking? Authorizing Provider  atorvastatin (LIPITOR) 20 MG tablet Take 20 mg by mouth daily.   Yes Historical Provider, MD  furosemide (LASIX) 20 MG tablet Take 20 mg by mouth daily.   Yes Historical Provider, MD  gabapentin (NEURONTIN) 300 MG capsule Take 600 mg by mouth 3 (three) times daily. 11/13/13  Yes Historical Provider, MD  insulin NPH-regular Human (NOVOLIN 70/30) (70-30) 100 UNIT/ML injection Inject 15 Units into the skin 2 (two) times daily with a meal. 01/08/15  Yes Loletha Grayer, MD  lisinopril (PRINIVIL,ZESTRIL) 20 MG tablet Take 20 mg by mouth daily.   Yes Historical Provider, MD  metFORMIN (GLUCOPHAGE) 1000 MG tablet Take 1,000 mg by mouth 2 (two) times daily. 09/17/13  Yes Historical Provider, MD  sertraline (ZOLOFT) 100 MG tablet Take 100 mg by mouth daily. 04/10/13  Yes Historical Provider, MD   Allergies  Allergen Reactions  . Naproxen Rash    FAMILY HISTORY:  Family History  Problem Relation Age  of Onset  . Hypertension Mother   . Hyperlipidemia Mother   . Heart disease Father   . Heart disease Maternal Grandfather    SOCIAL HISTORY:  reports that he has been smoking Cigarettes.  He has been smoking about 1.00 pack per day. He has never used smokeless tobacco. He reports that he does not drink alcohol or use drugs.  REVIEW OF SYSTEMS:   Could not obtain due to bipap.    VITAL SIGNS: Temp:  [97.6 F (36.4 C)] 97.6 F (36.4 C)  (12/16 2312) Pulse Rate:  [110-111] 110 (12/17 0000) Resp:  [19-25] 24 (12/17 0000) BP: (100-118)/(56-66) 118/61 (12/17 0000) SpO2:  [95 %-99 %] 96 % (12/17 0000) Weight:  [140.6 kg (310 lb)-149.1 kg (328 lb 11.2 oz)] 140.6 kg (310 lb) (12/16 2330) HEMODYNAMICS:   VENTILATOR SETTINGS:   INTAKE / OUTPUT:  Intake/Output Summary (Last 24 hours) at 02/08/16 0030 Last data filed at 02/08/16 0030  Gross per 24 hour  Intake              600 ml  Output                0 ml  Net              600 ml    Physical Examination:   VS: BP 118/61   Pulse (!) 110   Temp 97.6 F (36.4 C)   Resp (!) 24   Ht 5\' 10"  (1.778 m)   Wt (!) 140.6 kg (310 lb)   SpO2 96%   BMI 44.48 kg/m   General Appearance: No distress  Neuro:without focal findings,  HEENT: PERRLA, EOM intact, no ptosis, no other lesions noticed;  Pulmonary: normal breath sounds., diaphragmatic excursion normal. CardiovascularNormal S1,S2.  No m/r/g.    Abdomen: Benign, Soft, non-tender, No masses, hepatosplenomegaly, No lymphadenopathy Renal:  No costovertebral tenderness  GU:  Not performed at this time. Endoc: No evident thyromegaly, no signs of acromegaly. Skin:   warm, no rashes, no ecchymosis  Extremities: normal, no cyanosis, clubbing, no edema, warm with normal capillary refill.    LABS: Reviewed   LABORATORY PANEL:   CBC  Recent Labs Lab 02/07/16 2240  WBC 16.3*  HGB 13.2  HCT 39.2*  PLT 284    Chemistries   Recent Labs Lab 02/07/16 2240  NA 132*  K 4.6  CL 105  CO2 16*  GLUCOSE 500*  BUN 49*  CREATININE 2.43*  CALCIUM 8.2*  MG 1.7  AST 20  ALT 17  ALKPHOS 102  BILITOT 0.3    No results for input(s): GLUCAP in the last 168 hours. No results for input(s): PHART, PCO2ART, PO2ART in the last 168 hours.  Recent Labs Lab 02/07/16 2240  AST 20  ALT 17  ALKPHOS 102  BILITOT 0.3  ALBUMIN 3.0*    Cardiac Enzymes  Recent Labs Lab 02/07/16 2240  TROPONINI 0.20*    RADIOLOGY:    Dg Chest Portable 1 View  Result Date: 02/07/2016 CLINICAL DATA:  Initial evaluation for acute respiratory CED distress. EXAM: PORTABLE CHEST 1 VIEW COMPARISON:  Prior radiograph from 10/08/2014. FINDINGS: Accentuation of the cardiac silhouette related AP technique. Mediastinal silhouette within normal limits. Lungs normally inflated. Dense right lower lobe opacity, compatible with pneumonia. Underlying bronchitic changes. No pulmonary edema or pleural effusion. No pneumothorax. No acute osseous abnormality. IMPRESSION: 1. Acute right lower lobe pneumonia. 2. Underlying COPD. Electronically Signed   By: Jeannine Boga M.D.   On: 02/07/2016  23:28       --[name], MD.  Board Certified in Internal Medicine, Pulmonary Medicine, [place], and Sleep Medicine.  ICU Pager [phone] [facility] Office Number: [phone]  Patricia Pesa, M.D.  Vilinda Boehringer, M.D.  Merton Border, M.D   02/08/2016, 12:30 AM  Critical Care Attestation.  I have personally obtained a history, examined the patient, evaluated laboratory and imaging results, formulated the assessment and plan and placed orders. The Patient requires high complexity decision making for assessment and support, frequent evaluation and titration of therapies, application of advanced monitoring technologies and extensive interpretation of multiple databases. The patient has critical illness that could lead imminently to failure of 1 or more organ systems and requires the highest level of physician preparedness to intervene.  Critical Care Time devoted to patient care services described in this note is 45 minutes and is exclusive of time spent in procedures.

## 2016-02-08 NOTE — ED Notes (Signed)
This RN notified by lab that pt's lactic acid value is 2.1; MD notified

## 2016-02-08 NOTE — Progress Notes (Signed)
Transfer to hospitalist service discussed with Dr. Bridgett Larsson.  Ashby Dawes, M.D.  02/08/2016

## 2016-02-08 NOTE — Progress Notes (Addendum)
Signed called from Dr. Felicie Morn. The patient was admitted for acute hypoxic and hypercapnic respiratory failure with acute bronchitis, likely due to AECOPD and pneumonia last night. He was put on BiPAP and off BiPAP today.  In addition, he is treated with insulin drip due to hyperglycemia. Hyperkalemia, discontinued lisinopril, he is on insulin drip, follow-up BMP. The patient needs to stay in stepdown today and possible transfer to the floor tomorrow per Dr. Felicie Morn.

## 2016-02-08 NOTE — ED Notes (Signed)
Patient report called to CCU; spoke with Magda Paganini, RN. CCU nurse advised of presenting c/o, course of treatment, and transfer POC to their unit. Advised to return call with any questions or concerns related to the care that [name] received in the ED tonight.

## 2016-02-08 NOTE — Progress Notes (Signed)
ANTIBIOTIC CONSULT NOTE - INITIAL  Pharmacy Consult for Vancomycin  Indication: pneumonia  Allergies  Allergen Reactions  . Naproxen Rash    Patient Measurements: Height: 5\' 10"  (177.8 cm) Weight: (!) 310 lb (140.6 kg) IBW/kg (Calculated) : 73 Adjusted Body Weight: 100.04 kg   Vital Signs: Temp: 97.6 F (36.4 C) (12/16 2312) BP: 132/76 (12/17 0030) Pulse Rate: 105 (12/17 0030) Intake/Output from previous day: 12/16 0701 - 12/17 0700 In: 600 [IV Piggyback:600] Out: -  Intake/Output from this shift: Total I/O In: 600 [IV Piggyback:600] Out: -   Labs:  Recent Labs  02/07/16 2240  WBC 16.3*  HGB 13.2  PLT 284  CREATININE 2.43*   Estimated Creatinine Clearance: 48 mL/min (by C-G formula based on SCr of 2.43 mg/dL (H)). No results for input(s): VANCOTROUGH, VANCOPEAK, VANCORANDOM, GENTTROUGH, GENTPEAK, GENTRANDOM, TOBRATROUGH, TOBRAPEAK, TOBRARND, AMIKACINPEAK, AMIKACINTROU, AMIKACIN in the last 72 hours.   Microbiology: No results found for this or any previous visit (from the past 720 hour(s)).  Medical History: Past Medical History:  Diagnosis Date  . Diabetes mellitus type 2 in obese ([place])   . DM (diabetes mellitus) type II controlled with renal manifestation ([place])   . Hypercholesteremia   . Hypertension   . Morbid obesity with BMI of 45.0-49.9, adult ([place])   . Neuropathy ([place])   . Pancreatitis, acute     Medications:   (Not in a hospital admission) Assessment: CrCl = 48 ml/min Ke = 0.044 hr-1 T1/2 = 15.8 hrs Vd = 70 L   Goal of Therapy:  Vancomycin trough :  15 - 20 mcg/mL   Plan:  Expected duration 7 days with resolution of temperature and/or normalization of WBC   Vancomycin 2 gm IV X 1 given on 12/17 @ 00:00. Vancomycin 1250 mg IV Q18H ordered to start on 12/17 @ 15:00, 15 hrs after 1st dose (stacked dosing). This pt will reach Css by 12/20 @ 00:00. Will draw 1st trough on 12/19 @ 20:30, which will be very close to Css.   Zaki Gertsch  D 02/08/2016,12:55 AM

## 2016-02-09 ENCOUNTER — Inpatient Hospital Stay (HOSPITAL_COMMUNITY)
Admit: 2016-02-09 | Discharge: 2016-02-09 | Disposition: A | Discharge status: Home / Self Care | Payer: Medicaid Other | Attending: Adult Health | Admitting: Adult Health

## 2016-02-09 ENCOUNTER — Inpatient Hospital Stay: Payer: Medicaid Other

## 2016-02-09 DIAGNOSIS — J441 Chronic obstructive pulmonary disease with (acute) exacerbation: Secondary | ICD-10-CM

## 2016-02-09 LAB — ECHOCARDIOGRAM COMPLETE
HEIGHTINCHES: 70 in
WEIGHTICAEL: 5252.24 [oz_av]

## 2016-02-09 LAB — BASIC METABOLIC PANEL
ANION GAP: 5 (ref 5–15)
BUN: 54 mg/dL — ABNORMAL HIGH (ref 6–20)
CO2: 19 mmol/L — AB (ref 22–32)
CREATININE: 2.11 mg/dL — AB (ref 0.61–1.24)
Calcium: 8.2 mg/dL — ABNORMAL LOW (ref 8.9–10.3)
Chloride: 112 mmol/L — ABNORMAL HIGH (ref 101–111)
GFR calc non Af Amer: 33 mL/min — ABNORMAL LOW (ref >60)
GFR, EST AFRICAN AMERICAN: 39 mL/min — AB (ref >60)
Glucose, Bld: 152 mg/dL — ABNORMAL HIGH (ref 65–99)
Potassium: 5 mmol/L (ref 3.5–5.1)
Sodium: 136 mmol/L (ref 135–145)

## 2016-02-09 LAB — PROCALCITONIN: PROCALCITONIN: 0.46 ng/mL

## 2016-02-09 LAB — GLUCOSE, CAPILLARY
GLUCOSE-CAPILLARY: 127 mg/dL — AB (ref 65–99)
GLUCOSE-CAPILLARY: 136 mg/dL — AB (ref 65–99)
GLUCOSE-CAPILLARY: 144 mg/dL — AB (ref 65–99)
GLUCOSE-CAPILLARY: 154 mg/dL — AB (ref 65–99)
GLUCOSE-CAPILLARY: 180 mg/dL — AB (ref 65–99)
Glucose-Capillary: 136 mg/dL — ABNORMAL HIGH (ref 65–99)
Glucose-Capillary: 154 mg/dL — ABNORMAL HIGH (ref 65–99)
Glucose-Capillary: 243 mg/dL — ABNORMAL HIGH (ref 65–99)
Glucose-Capillary: 346 mg/dL — ABNORMAL HIGH (ref 65–99)

## 2016-02-09 LAB — CBC
HEMATOCRIT: 33.8 % — AB (ref 40.0–52.0)
HEMOGLOBIN: 11.4 g/dL — AB (ref 13.0–18.0)
MCH: 29.9 pg (ref 26.0–34.0)
MCHC: 33.8 g/dL (ref 32.0–36.0)
MCV: 88.5 fL (ref 80.0–100.0)
Platelets: 240 10*3/uL (ref 150–440)
RBC: 3.82 MIL/uL — AB (ref 4.40–5.90)
RDW: 13.7 % (ref 11.5–14.5)
WBC: 21.7 10*3/uL — ABNORMAL HIGH (ref 3.8–10.6)

## 2016-02-09 MED ORDER — METOPROLOL TARTRATE 5 MG/5ML IV SOLN: 5.0000 mg | Freq: Once | INTRAVENOUS | Status: DC

## 2016-02-09 MED ORDER — INSULIN ASPART 100 UNIT/ML ~~LOC~~ SOLN
0.0000 [IU] | SUBCUTANEOUS | Status: DC
  Administered 2016-02-09: 4 [IU] via SUBCUTANEOUS
  Administered 2016-02-09: 15 [IU] via SUBCUTANEOUS
  Administered 2016-02-09: 7 [IU] via SUBCUTANEOUS
  Filled 2016-02-09: qty 15
  Filled 2016-02-09: qty 4
  Filled 2016-02-09: qty 7

## 2016-02-09 MED ORDER — INSULIN GLARGINE 100 UNIT/ML ~~LOC~~ SOLN
15.0000 [IU] | Freq: Every day | SUBCUTANEOUS | Status: DC
  Administered 2016-02-09: 15 [IU] via SUBCUTANEOUS
  Filled 2016-02-09: qty 0.15

## 2016-02-09 MED ORDER — PREDNISONE 10 MG (21) PO TBPK: 10.0000 mg | ORAL_TABLET | Freq: Every day | ORAL | 0 refills | Status: DC

## 2016-02-09 MED ORDER — FLUTICASONE-SALMETEROL 250-50 MCG/DOSE IN AEPB: 1.0000 | INHALATION_SPRAY | Freq: Two times a day (BID) | RESPIRATORY_TRACT | 1 refills | Status: DC

## 2016-02-09 MED ORDER — INSULIN NPH ISOPHANE & REGULAR (70-30) 100 UNIT/ML ~~LOC~~ SUSP: 20.0000 [IU] | Freq: Two times a day (BID) | SUBCUTANEOUS | 2 refills | Status: DC

## 2016-02-09 MED ORDER — SODIUM CHLORIDE 0.9 % IV SOLN
250.0000 mL | INTRAVENOUS | Status: DC | PRN
  Administered 2016-02-09: 250 mL via INTRAVENOUS

## 2016-02-09 MED ORDER — LEVOFLOXACIN 500 MG PO TABS: 500.0000 mg | ORAL_TABLET | Freq: Every day | ORAL | 0 refills | Status: DC

## 2016-02-09 MED ORDER — TIOTROPIUM BROMIDE MONOHYDRATE 18 MCG IN CAPS
18.0000 ug | ORAL_CAPSULE | Freq: Every day | RESPIRATORY_TRACT | Status: DC
  Administered 2016-02-09: 18 ug via RESPIRATORY_TRACT
  Filled 2016-02-09: qty 5

## 2016-02-09 MED ORDER — MOMETASONE FURO-FORMOTEROL FUM 100-5 MCG/ACT IN AERO
2.0000 | INHALATION_SPRAY | Freq: Two times a day (BID) | RESPIRATORY_TRACT | Status: DC
  Administered 2016-02-09: 2 via RESPIRATORY_TRACT
  Filled 2016-02-09: qty 8.8

## 2016-02-09 MED ORDER — TIOTROPIUM BROMIDE MONOHYDRATE 18 MCG IN CAPS: 18.0000 ug | ORAL_CAPSULE | Freq: Every day | RESPIRATORY_TRACT | 1 refills | Status: DC

## 2016-02-09 NOTE — Progress Notes (Signed)
*  PRELIMINARY RESULTS* Echocardiogram 2D Echocardiogram has been performed.  Brian [name] 02/09/2016, 10:49 AM

## 2016-02-09 NOTE — Progress Notes (Addendum)
Patient is being discharged home. IV removed. Tele discontinued. Reviewed meds and last dose given. Allowed time for questions. Gave patient room bin meds.

## 2016-02-09 NOTE — H&P (Signed)
[facility] Consultation     ASSESSMENT/PLAN   55 yo obese white male with acute COPD exacerabation-slowly improving PULMONARY A-:Acute hypoxic and hypercapnic respiratory failure with acute bronchitis, likely due to AECOPD and pneumonia.  --CXR images reviewed; c/w RLL pneumonia.  Steroids and aggressive BD therapy Smoking cessation discussed for 10 mins  CARDIOVASCULAR A:  Essential HTN.  P:  Monitor and rx BP as needed.   RENAL A:  AKI secondary to sepsis.  Continue IVF.   GASTROINTESTINAL A: Famotidine for GI prophylaxis.   INFECTIOUS A: Pneumonia with sepsis.    Micro/culture results:  BCx2 -- UC -- Sputum-- Influenza NEGATIVE Procalcitonin 0.46  Antibiotics: Vancomycin 12/17>> Levaquin 12/17>>   ENDOCRINE A:  Diabetes   P:   Levemir and SSI.    NEUROLOGIC A:  -- P:   RASS goal: -- --   MAJOR EVENTS/TEST RESULTS:   Best Practices  DVT Prophylaxis: Heparin GI Prophylaxis: famotidine.    ---------------------------------------  ---------------------------------------   Name: Brian [name] MRN: [id number] DOB: Oct 25, 1960    ADMISSION DATE:  02/07/2016   CHIEF COMPLAINT: Dyspnea   HISTORY OF PRESENT ILLNESS:   No resp distress, minimal oxygen Ok to transfer to gen med floor   Review of Systems:  Gen:  Denies  fever, sweats, chills weigh loss   HEENT: Denies blurred vision, double vision, ear pain, eye pain, hearing loss, nose bleeds, sore throat  Cardiac:  No dizziness, chest pain or heaviness, chest tightness,edema  Resp:   Denies cough or sputum porduction, shortness of breath,+wheezing, -hemoptysis,   Other:  All other systems negative   VITAL SIGNS: Temp:  [97.9 F (36.6 C)-99 F (37.2 C)] 98.2 F (36.8 C) (12/18 0035) Pulse Rate:  [92-114] 94 (12/18 0700) Resp:  [16-24] 19 (12/18 0700) BP: (79-162)/(62-97) 160/97 (12/18 0700) SpO2:  [93 %-100 %] 93 % (12/18 0700) Weight:  [328 lb  4.2 oz (148.9 kg)] 328 lb 4.2 oz (148.9 kg) (12/18 0400)   Intake/Output Summary (Last 24 hours) at 02/09/16 0759 Last data filed at 02/09/16 0700  Gross per 24 hour  Intake          2663.29 ml  Output             1200 ml  Net          1463.29 ml    Physical Examination:   VS: BP (!) 160/97   Pulse 94   Temp 98.2 F (36.8 C) (Oral)   Resp 19   Ht 5\' 10"  (1.778 m)   Wt (!) 328 lb 4.2 oz (148.9 kg)   SpO2 93%   BMI 47.10 kg/m   General Appearance: No distress  Neuro:without focal findings,  HEENT: PERRLA, EOM intact, no ptosis, no other lesions noticed;  Pulmonary: normal breath sounds., diaphragmatic excursion normal. CardiovascularNormal S1,S2.  No m/r/g.    Abdomen: Benign, Soft, non-tender, No masses, hepatosplenomegaly, No lymphadenopathy Renal:  No costovertebral tenderness  GU:  Not performed at this time. Endoc: No evident thyromegaly, no signs of acromegaly. Skin:   warm, no rashes, no ecchymosis  Extremities: normal, no cyanosis, clubbing, no edema, warm with normal capillary refill.    LABS: Reviewed   LABORATORY PANEL:   CBC  Recent Labs Lab 02/09/16 0439  WBC 21.7*  HGB 11.4*  HCT 33.8*  PLT 240    Chemistries   Recent Labs Lab 02/07/16 2240 02/08/16 0449  02/09/16 0439  NA 132* 131*  < > 136  K 4.6 5.8*  < > 5.0  CL 105 107  < > 112*  CO2 16* 18*  < > 19*  GLUCOSE 500* 570*  < > 152*  BUN 49* 51*  < > 54*  CREATININE 2.43* 2.32*  < > 2.11*  CALCIUM 8.2* 8.2*  < > 8.2*  MG 1.7 1.9  --   --   AST 20  --   --   --   ALT 17  --   --   --   ALKPHOS 102  --   --   --   BILITOT 0.3  --   --   --   < > = values in this interval not displayed.   Recent Labs Lab 02/09/16 0155 02/09/16 0257 02/09/16 0400 02/09/16 0459 02/09/16 0602 02/09/16 0703  GLUCAP 136* 144* 154* 136* 154* 180*    Recent Labs Lab 02/08/16 0040  PHART 7.27*  PCO2ART 37  PO2ART 88    Recent Labs Lab 02/07/16 2240  AST 20  ALT 17  ALKPHOS 102    BILITOT 0.3  ALBUMIN 3.0*    Cardiac Enzymes  Recent Labs Lab 02/07/16 2240  TROPONINI 0.20*    RADIOLOGY:  Dg Chest Port 1 View  Result Date: 02/09/2016 CLINICAL DATA:  Pneumonia . EXAM: PORTABLE CHEST 1 VIEW COMPARISON:  02/07/2016.  Chest x-ray 16 2016 . FINDINGS: Stable cardiomegaly. Partial clearing of bilateral pulmonary infiltrates/in the. No pleural effusion or pneumothorax. IMPRESSION: Partial clearing of bilateral pulmonary infiltrates/edema . Stable cardiomegaly . Electronically Signed   By: Marcello Moores  Register   On: 02/09/2016 07:22   Dg Chest Portable 1 View  Result Date: 02/07/2016 CLINICAL DATA:  Initial evaluation for acute respiratory CED distress. EXAM: PORTABLE CHEST 1 VIEW COMPARISON:  Prior radiograph from 10/08/2014. FINDINGS: Accentuation of the cardiac silhouette related AP technique. Mediastinal silhouette within normal limits. Lungs normally inflated. Dense right lower lobe opacity, compatible with pneumonia. Underlying bronchitic changes. No pulmonary edema or pleural effusion. No pneumothorax. No acute osseous abnormality. IMPRESSION: 1. Acute right lower lobe pneumonia. 2. Underlying COPD. Electronically Signed   By: Jeannine Boga M.D.   On: 02/07/2016 23:28     I have personally obtained a history, examined the patient, evaluated Pertinent laboratory and RadioGraphic/imaging results, and  formulated the assessment and plan   The Patient requires high complexity decision making for assessment and support, frequent evaluation and titration of therapies.  Patient satisfied with Plan of action and management. All questions answered Ok to transfer to gen med floor  Will follow in outpatient clinic  Kaidynce Pfister Patricia Pesa, M.D.  [facility]  Medical Director [place] Director [facility] Cardio-Pulmonary Department

## 2016-02-09 NOTE — Discharge Summary (Signed)
[facility] [place] NAME: Brian [name]    MR#:  [id number]  DATE OF BIRTH:  1960/10/25  DATE OF ADMISSION:  02/07/2016 ADMITTING PHYSICIAN: Laverle Hobby, MD  DATE OF DISCHARGE: 02/08/16 PRIMARY CARE PHYSICIAN: Donnie Coffin, MD    ADMISSION DIAGNOSIS:  Metabolic acidosis [D63.8] COPD exacerbation (HCC) [J44.1] Acute respiratory failure with hypoxia (HCC) [J96.01] Severe sepsis (HCC) [A41.9, R65.20] Aspiration pneumonia of right lower lobe, unspecified aspiration pneumonia type ([place]) [J69.0]  DISCHARGE DIAGNOSIS:  Active Problems:   COPD exacerbation ([place]) Tobacco abuse Insulin-dependent diabetes mellitus  SECONDARY DIAGNOSIS:   Past Medical History:  Diagnosis Date  . Diabetes mellitus type 2 in obese ([place])   . DM (diabetes mellitus) type II controlled with renal manifestation ([place])   . Hypercholesteremia   . Hypertension   . Morbid obesity with BMI of 45.0-49.9, adult ([place])   . Neuropathy ([place])   . Pancreatitis, acute     HOSPITAL COURSE:   Please review history and physical for details Hospital course  #1 acute respiratory failure with hypoxia secondary to acute COPD exacerbation Patient was admitted to intensivist service clinically improved with steroids, nebulizer treatments and IV antibiotics Influenza negative Discharge patient with the steroid tapering and levofloxacin. The patient has received levofloxacin and vancomycin during the hospital course   #pneumonia patient has received vancomycin and cefepime eventually changed to Levaquin and vancomycin. Clinically improved. Influenza is negative. Discharge patient with the by mouth levofloxacin. Blood cultures are negative so far  #Insulin dependent diabetes mellitus 70/30 dose increased to 20 units twice a day  #Hypertension Blood pressure is elevated could be from steroids and IV fluids Continue close monitoring. Continue home medications and  PCP to titrate  #Chronic kidney disease could be secondary to diabetes Outpatient follow-up with nephrology as recommended  #Tobacco abuse disorder Counseled patient to quit smoking for 3-5 minutes. Patient is agreeable. Suggested to continue nicotine patch, patient will think about it. At this point he thinks he does not need nicotine patches  DISCHARGE CONDITIONS:   stable  CONSULTS OBTAINED:     PROCEDURES none  DRUG ALLERGIES:   Allergies  Allergen Reactions  . Naproxen Rash    DISCHARGE MEDICATIONS:   Current Discharge Medication List    START taking these medications   Details  Fluticasone-Salmeterol (ADVAIR DISKUS) 250-50 MCG/DOSE AEPB Inhale 1 puff into the lungs 2 (two) times daily. Qty: 60 each, Refills: 1    levofloxacin (LEVAQUIN) 500 MG tablet Take 1 tablet (500 mg total) by mouth daily. Qty: 5 tablet, Refills: 0    predniSONE (STERAPRED UNI-PAK 21 TAB) 10 MG (21) TBPK tablet Take 1 tablet (10 mg total) by mouth daily. Take 6 tablets by mouth for 1 day followed by  5 tablets by mouth for 1 day followed by  4 tablets by mouth for 1 day followed by  3 tablets by mouth for 1 day followed by  2 tablets by mouth for 1 day followed by  1 tablet by mouth for a day and stop Qty: 21 tablet, Refills: 0    tiotropium (SPIRIVA) 18 MCG inhalation capsule Place 1 capsule (18 mcg total) into inhaler and inhale daily. Qty: 30 capsule, Refills: 1      CONTINUE these medications which have CHANGED   Details  insulin NPH-regular Human (NOVOLIN 70/30) (70-30) 100 UNIT/ML injection Inject 20 Units into the skin 2 (two) times daily with a meal. Qty: 10 mL, Refills: 2  CONTINUE these medications which have NOT CHANGED   Details  atorvastatin (LIPITOR) 20 MG tablet Take 20 mg by mouth daily.    furosemide (LASIX) 20 MG tablet Take 20 mg by mouth daily.    gabapentin (NEURONTIN) 300 MG capsule Take 600 mg by mouth 3 (three) times daily.    lisinopril  (PRINIVIL,ZESTRIL) 20 MG tablet Take 20 mg by mouth daily.    metFORMIN (GLUCOPHAGE) 1000 MG tablet Take 1,000 mg by mouth 2 (two) times daily.    sertraline (ZOLOFT) 100 MG tablet Take 100 mg by mouth daily.         DISCHARGE INSTRUCTIONS:   Follow-up with primary care physician in a week Follow-up with pulmonology in 2 weeks Stop smoking  DIET:  Diabetic diet  DISCHARGE CONDITION:  Stable  ACTIVITY:  Activity as tolerated  OXYGEN:  Home Oxygen: No.   Oxygen Delivery: room air  DISCHARGE LOCATION:  home   If you experience worsening of your admission symptoms, develop shortness of breath, life threatening emergency, suicidal or homicidal thoughts you must seek medical attention immediately by calling 911 or calling your MD immediately  if symptoms less severe.  You Must read complete instructions/literature along with all the possible adverse reactions/side effects for all the Medicines you take and that have been prescribed to you. Take any new Medicines after you have completely understood and accpet all the possible adverse reactions/side effects.   Please note  You were cared for by a hospitalist during your hospital stay. If you have any questions about your discharge medications or the care you received while you were in the hospital after you are discharged, you can call the unit and asked to speak with the hospitalist on call if the hospitalist that took care of you is not available. Once you are discharged, your primary care physician will handle any further medical issues. Please note that NO REFILLS for any discharge medications will be authorized once you are discharged, as it is imperative that you return to your primary care physician (or establish a relationship with a primary care physician if you do not have one) for your aftercare needs so that they can reassess your need for medications and monitor your lab values.     Today  Chief Complaint  Patient  presents with  . Shortness of Breath   Patient denies any shortness of breath or wheezing. Ambulating without any difficulty. Wants to go home. Counseled patient to quit smoking.  ROS:  CONSTITUTIONAL: Denies fevers, chills. Denies any fatigue, weakness.  EYES: Denies blurry vision, double vision, eye pain. EARS, NOSE, THROAT: Denies tinnitus, ear pain, hearing loss. RESPIRATORY: Denies cough, wheeze, shortness of breath.  CARDIOVASCULAR: Denies chest pain, palpitations, edema.  GASTROINTESTINAL: Denies nausea, vomiting, diarrhea, abdominal pain. Denies bright red blood per rectum. GENITOURINARY: Denies dysuria, hematuria. ENDOCRINE: Denies nocturia or thyroid problems. HEMATOLOGIC AND LYMPHATIC: Denies easy bruising or bleeding. SKIN: Denies rash or lesion. MUSCULOSKELETAL: Denies pain in neck, back, shoulder, knees, hips or arthritic symptoms.  NEUROLOGIC: Denies paralysis, paresthesias.  PSYCHIATRIC: Denies anxiety or depressive symptoms.   VITAL SIGNS:  Blood pressure (!) 157/84, pulse (!) 101, temperature 98.7 F (37.1 C), temperature source Oral, resp. rate 19, height 5\' 10"  (1.778 m), weight (!) 148.9 kg (328 lb 4.2 oz), SpO2 95 %.  I/O:    Intake/Output Summary (Last 24 hours) at 02/09/16 1313 Last data filed at 02/09/16 0700  Gross per 24 hour  Intake  2188.81 ml  Output             1200 ml  Net           988.81 ml    PHYSICAL EXAMINATION:  GENERAL:  55 y.o.-year-old patient lying in the bed with no acute distress.  EYES: Pupils equal, round, reactive to light and accommodation. No scleral icterus. Extraocular muscles intact.  HEENT: Head atraumatic, normocephalic. Oropharynx and nasopharynx clear.  NECK:  Supple, no jugular venous distention. No thyroid enlargement, no tenderness.  LUNGS: Moderate breath sounds bilaterally, no wheezing, rales,rhonchi or crepitation. No use of accessory muscles of respiration.  CARDIOVASCULAR: S1, S2 normal. No murmurs,  rubs, or gallops.  ABDOMEN: Soft, non-tender, non-distended. Bowel sounds present. No organomegaly or mass.  EXTREMITIES: No pedal edema, cyanosis, or clubbing.  NEUROLOGIC: Cranial nerves II through XII are intact. Muscle strength 5/5 in all extremities. Sensation intact. Gait not checked.  PSYCHIATRIC: The patient is alert and oriented x 3.  SKIN: No obvious rash, lesion, or ulcer.   DATA REVIEW:   CBC  Recent Labs Lab 02/09/16 0439  WBC 21.7*  HGB 11.4*  HCT 33.8*  PLT 240    Chemistries   Recent Labs Lab 02/07/16 2240 02/08/16 0449  02/09/16 0439  NA 132* 131*  < > 136  K 4.6 5.8*  < > 5.0  CL 105 107  < > 112*  CO2 16* 18*  < > 19*  GLUCOSE 500* 570*  < > 152*  BUN 49* 51*  < > 54*  CREATININE 2.43* 2.32*  < > 2.11*  CALCIUM 8.2* 8.2*  < > 8.2*  MG 1.7 1.9  --   --   AST 20  --   --   --   ALT 17  --   --   --   ALKPHOS 102  --   --   --   BILITOT 0.3  --   --   --   < > = values in this interval not displayed.  Cardiac Enzymes  Recent Labs Lab 02/07/16 2240  TROPONINI 0.20*    Microbiology Results  Results for orders placed or performed during the hospital encounter of 02/07/16  Blood culture (routine x 2)     Status: None (Preliminary result)   Collection Time: 02/07/16 11:40 PM  Result Value Ref Range Status   Specimen Description BLOOD RIGHT HAND  Final   Special Requests   Final    BOTTLES DRAWN AEROBIC AND ANAEROBIC 11CCAERO,11CCANA   Culture NO GROWTH 2 DAYS  Final   Report Status PENDING  Incomplete  Blood culture (routine x 2)     Status: None (Preliminary result)   Collection Time: 02/07/16 11:40 PM  Result Value Ref Range Status   Specimen Description BLOOD RIGHT ARM  Final   Special Requests   Final    BOTTLES DRAWN AEROBIC AND ANAEROBIC [place]   Culture NO GROWTH 2 DAYS  Final   Report Status PENDING  Incomplete  MRSA PCR Screening     Status: None   Collection Time: 02/08/16  6:04 AM  Result Value Ref Range Status    MRSA by PCR NEGATIVE NEGATIVE Final    Comment:        The GeneXpert MRSA Assay (FDA approved for NASAL specimens only), is one component of a comprehensive MRSA colonization surveillance program. It is not intended to diagnose MRSA infection nor to guide or monitor treatment for MRSA infections.  RADIOLOGY:  Dg Chest Port 1 View  Result Date: 02/09/2016 CLINICAL DATA:  Pneumonia . EXAM: PORTABLE CHEST 1 VIEW COMPARISON:  02/07/2016.  Chest x-ray 16 2016 . FINDINGS: Stable cardiomegaly. Partial clearing of bilateral pulmonary infiltrates/in the. No pleural effusion or pneumothorax. IMPRESSION: Partial clearing of bilateral pulmonary infiltrates/edema . Stable cardiomegaly . Electronically Signed   By: Marcello Moores  Register   On: 02/09/2016 07:22   Dg Chest Portable 1 View  Result Date: 02/07/2016 CLINICAL DATA:  Initial evaluation for acute respiratory CED distress. EXAM: PORTABLE CHEST 1 VIEW COMPARISON:  Prior radiograph from 10/08/2014. FINDINGS: Accentuation of the cardiac silhouette related AP technique. Mediastinal silhouette within normal limits. Lungs normally inflated. Dense right lower lobe opacity, compatible with pneumonia. Underlying bronchitic changes. No pulmonary edema or pleural effusion. No pneumothorax. No acute osseous abnormality. IMPRESSION: 1. Acute right lower lobe pneumonia. 2. Underlying COPD. Electronically Signed   By: Jeannine Boga M.D.   On: 02/07/2016 23:28    EKG:   Orders placed or performed during the hospital encounter of 02/07/16  . EKG 12-Lead  . EKG 12-Lead  . ED EKG  . ED EKG      Management plans discussed with the patient, family and they are in agreement.  CODE STATUS:     Code Status Orders        Start     Ordered   02/08/16 0025  Full code  Continuous     02/08/16 0028    Code Status History    Date Active Date Inactive Code Status Order ID Comments User Context   01/06/2015 12:09 AM 01/08/2015  9:38 PM Full Code  [id number]  Hubbard Robinson, MD ED      TOTAL TIME TAKING CARE OF THIS PATIENT: 45 minutes.   Note: This dictation was prepared with Dragon dictation along with smaller phrase technology. Any transcriptional errors that result from this process are unintentional.   @[facility] @  on 02/09/2016 at 1:13 PM  Between 7am to 6pm - Pager - [phone]  After 6pm go to [email or web address] - password [email or web address] [facility]  Office  [phone]  CC: Primary care physician; Donnie Coffin, MD

## 2016-02-09 NOTE — Progress Notes (Signed)
ANTIBIOTIC CONSULT NOTE - FOLLOW UP   Pharmacy Consult for Cefepime  Indication: pneumonia  Allergies  Allergen Reactions  . Naproxen Rash    Patient Measurements: Height: 5\' 10"  (177.8 cm) Weight: (!) 328 lb 4.2 oz (148.9 kg) IBW/kg (Calculated) : 73 Adjusted Body Weight: 100.04 kg   Vital Signs: Temp: 98.7 F (37.1 C) (12/18 1153) Temp Source: Oral (12/18 1153) BP: 157/84 (12/18 1153) Pulse Rate: 101 (12/18 1153) Intake/Output from previous day: 12/17 0701 - 12/18 0700 In: 2663.3 [P.O.:480; I.V.:1983.3; IV Piggyback:200] Out: 1875 [Urine:1875] Intake/Output from this shift: No intake/output data recorded.  Labs:  Recent Labs  02/07/16 2240 02/08/16 0449 02/08/16 1539 02/09/16 0439  WBC 16.3* 14.9*  --  21.7*  HGB 13.2 11.9*  --  11.4*  PLT 284 224  --  240  CREATININE 2.43* 2.32* 2.37* 2.11*   Estimated Creatinine Clearance: 57.2 mL/min (by C-G formula based on SCr of 2.11 mg/dL (H)). No results for input(s): VANCOTROUGH, VANCOPEAK, VANCORANDOM, GENTTROUGH, GENTPEAK, GENTRANDOM, TOBRATROUGH, TOBRAPEAK, TOBRARND, AMIKACINPEAK, AMIKACINTROU, AMIKACIN in the last 72 hours.   Microbiology: Recent Results (from the past 720 hour(s))  Blood culture (routine x 2)     Status: None (Preliminary result)   Collection Time: 02/07/16 11:40 PM  Result Value Ref Range Status   Specimen Description BLOOD RIGHT HAND  Final   Special Requests   Final    BOTTLES DRAWN AEROBIC AND ANAEROBIC [place]   Culture NO GROWTH 2 DAYS  Final   Report Status PENDING  Incomplete  Blood culture (routine x 2)     Status: None (Preliminary result)   Collection Time: 02/07/16 11:40 PM  Result Value Ref Range Status   Specimen Description BLOOD RIGHT ARM  Final   Special Requests   Final    BOTTLES DRAWN AEROBIC AND ANAEROBIC [place]   Culture NO GROWTH 2 DAYS  Final   Report Status PENDING  Incomplete  MRSA PCR Screening     Status: None   Collection Time: 02/08/16   6:04 AM  Result Value Ref Range Status   MRSA by PCR NEGATIVE NEGATIVE Final    Comment:        The GeneXpert MRSA Assay (FDA approved for NASAL specimens only), is one component of a comprehensive MRSA colonization surveillance program. It is not intended to diagnose MRSA infection nor to guide or monitor treatment for MRSA infections.     Medical History: Past Medical History:  Diagnosis Date  . Diabetes mellitus type 2 in obese ([place])   . DM (diabetes mellitus) type II controlled with renal manifestation ([place])   . Hypercholesteremia   . Hypertension   . Morbid obesity with BMI of 45.0-49.9, adult ([place])   . Neuropathy ([place])   . Pancreatitis, acute     Medications:  Prescriptions Prior to Admission  Medication Sig Dispense Refill Last Dose  . atorvastatin (LIPITOR) 20 MG tablet Take 20 mg by mouth daily.   02/07/2016 at Unknown time  . furosemide (LASIX) 20 MG tablet Take 20 mg by mouth daily.   02/07/2016 at Unknown time  . gabapentin (NEURONTIN) 300 MG capsule Take 600 mg by mouth 3 (three) times daily.     . insulin NPH-regular Human (NOVOLIN 70/30) (70-30) 100 UNIT/ML injection Inject 15 Units into the skin 2 (two) times daily with a meal. 10 mL 2 02/07/2016 at Unknown time  . lisinopril (PRINIVIL,ZESTRIL) 20 MG tablet Take 20 mg by mouth daily.   02/07/2016 at Unknown time  .  metFORMIN (GLUCOPHAGE) 1000 MG tablet Take 1,000 mg by mouth 2 (two) times daily.     . sertraline (ZOLOFT) 100 MG tablet Take 100 mg by mouth daily.      Continue Cefepime 2 g IV q12 hours. Patient is also on levofloxacin 500mg  IV Q24h. Will F/U about de-escalation of antibiotics.   12/16 BCx: NGTD 12/17 MRSA PCR: negative   Loree Fee, PharmD 02/09/2016,12:55 PM

## 2016-02-09 NOTE — Discharge Instructions (Signed)
Follow-up with primary care physician in a week Follow-up with pulmonology in 2 weeks Stop smoking

## 2016-02-09 NOTE — Progress Notes (Signed)
Patient was transferred from ICU to room 149, Tele placed. A&O x4. Oriented to room, call light, TV and bed controls. TEDs and NSL in place. Report received from [place], [place] before transfer.

## 2016-02-09 NOTE — Progress Notes (Signed)
Inpatient Diabetes Program Recommendations  AACE/ADA: New Consensus Statement on Inpatient Glycemic Control (2015)  Target Ranges:  Prepandial:   less than 140 mg/dL      Peak postprandial:   less than 180 mg/dL (1-2 hours)      Critically ill patients:  140 - 180 mg/dL   Lab Results  Component Value Date   GLUCAP 346 (H) 02/09/2016   HGBA1C 11.7 (H) 01/05/2015    Review of Glycemic Control  Diabetes history: Type 2 diabetes Outpatient Diabetes medications: Novolin 70/30 15 units bid, Metformin 1000 mg bid Current orders for Inpatient glycemic control:  Lantus 15 units daily, Novolog resistant q 4 hours  Inpatient Diabetes Program Recommendations:    Please consider checking A1C.  Blood sugars increasing off insulin drip.  Consider increasing Lantus to 25 units daily and add Novolog meal coverage 5 units tid with meals. Will follow.  Thanks, Adah Perl, RN, BC-ADM Inpatient Diabetes Coordinator Pager [phone] (8a-5p)

## 2016-02-12 LAB — CULTURE, BLOOD (ROUTINE X 2)
Culture: NO GROWTH
Culture: NO GROWTH

## 2016-02-26 ENCOUNTER — Emergency Department: Payer: Medicaid Other

## 2016-02-26 ENCOUNTER — Encounter: Payer: Self-pay | Admitting: Emergency Medicine

## 2016-02-26 ENCOUNTER — Inpatient Hospital Stay
Admission: EM | Admit: 2016-02-26 | Discharge: 2016-02-28 | DRG: 291 | Disposition: A | Discharge status: Home / Self Care | Payer: Medicaid Other | Attending: Internal Medicine | Admitting: Internal Medicine

## 2016-02-26 DIAGNOSIS — Z6841 Body Mass Index (BMI) 40.0 and over, adult: Secondary | ICD-10-CM | POA: Diagnosis not present

## 2016-02-26 DIAGNOSIS — Z794 Long term (current) use of insulin: Secondary | ICD-10-CM | POA: Diagnosis not present

## 2016-02-26 DIAGNOSIS — I509 Heart failure, unspecified: Secondary | ICD-10-CM

## 2016-02-26 DIAGNOSIS — N183 Chronic kidney disease, stage 3 (moderate): Secondary | ICD-10-CM | POA: Diagnosis present

## 2016-02-26 DIAGNOSIS — I5031 Acute diastolic (congestive) heart failure: Secondary | ICD-10-CM | POA: Diagnosis present

## 2016-02-26 DIAGNOSIS — E785 Hyperlipidemia, unspecified: Secondary | ICD-10-CM | POA: Diagnosis present

## 2016-02-26 DIAGNOSIS — J9601 Acute respiratory failure with hypoxia: Secondary | ICD-10-CM | POA: Diagnosis present

## 2016-02-26 DIAGNOSIS — E875 Hyperkalemia: Secondary | ICD-10-CM | POA: Diagnosis present

## 2016-02-26 DIAGNOSIS — E1122 Type 2 diabetes mellitus with diabetic chronic kidney disease: Secondary | ICD-10-CM | POA: Diagnosis present

## 2016-02-26 DIAGNOSIS — J441 Chronic obstructive pulmonary disease with (acute) exacerbation: Secondary | ICD-10-CM | POA: Diagnosis present

## 2016-02-26 DIAGNOSIS — D649 Anemia, unspecified: Secondary | ICD-10-CM | POA: Diagnosis present

## 2016-02-26 DIAGNOSIS — F17210 Nicotine dependence, cigarettes, uncomplicated: Secondary | ICD-10-CM | POA: Diagnosis present

## 2016-02-26 DIAGNOSIS — E7800 Pure hypercholesterolemia, unspecified: Secondary | ICD-10-CM | POA: Diagnosis present

## 2016-02-26 DIAGNOSIS — J440 Chronic obstructive pulmonary disease with acute lower respiratory infection: Secondary | ICD-10-CM | POA: Diagnosis present

## 2016-02-26 DIAGNOSIS — Z8249 Family history of ischemic heart disease and other diseases of the circulatory system: Secondary | ICD-10-CM | POA: Diagnosis not present

## 2016-02-26 DIAGNOSIS — E1165 Type 2 diabetes mellitus with hyperglycemia: Secondary | ICD-10-CM | POA: Diagnosis present

## 2016-02-26 DIAGNOSIS — Z9049 Acquired absence of other specified parts of digestive tract: Secondary | ICD-10-CM

## 2016-02-26 DIAGNOSIS — I130 Hypertensive heart and chronic kidney disease with heart failure and stage 1 through stage 4 chronic kidney disease, or unspecified chronic kidney disease: Secondary | ICD-10-CM | POA: Diagnosis present

## 2016-02-26 DIAGNOSIS — R0602 Shortness of breath: Secondary | ICD-10-CM | POA: Diagnosis present

## 2016-02-26 DIAGNOSIS — I248 Other forms of acute ischemic heart disease: Secondary | ICD-10-CM | POA: Diagnosis present

## 2016-02-26 DIAGNOSIS — E1140 Type 2 diabetes mellitus with diabetic neuropathy, unspecified: Secondary | ICD-10-CM | POA: Diagnosis present

## 2016-02-26 DIAGNOSIS — J189 Pneumonia, unspecified organism: Secondary | ICD-10-CM | POA: Diagnosis present

## 2016-02-26 DIAGNOSIS — Z7951 Long term (current) use of inhaled steroids: Secondary | ICD-10-CM

## 2016-02-26 DIAGNOSIS — J810 Acute pulmonary edema: Secondary | ICD-10-CM

## 2016-02-26 DIAGNOSIS — Z886 Allergy status to analgesic agent status: Secondary | ICD-10-CM

## 2016-02-26 DIAGNOSIS — Z79899 Other long term (current) drug therapy: Secondary | ICD-10-CM

## 2016-02-26 DIAGNOSIS — F329 Major depressive disorder, single episode, unspecified: Secondary | ICD-10-CM | POA: Diagnosis present

## 2016-02-26 HISTORY — DX: Pneumonia, unspecified organism: J18.9

## 2016-02-26 HISTORY — DX: Chronic obstructive pulmonary disease, unspecified: J44.9

## 2016-02-26 LAB — BRAIN NATRIURETIC PEPTIDE: B Natriuretic Peptide: 450 pg/mL — ABNORMAL HIGH (ref 0.0–100.0)

## 2016-02-26 LAB — COMPREHENSIVE METABOLIC PANEL
ALBUMIN: 2.9 g/dL — AB (ref 3.5–5.0)
ALT: 21 U/L (ref 17–63)
AST: 21 U/L (ref 15–41)
Alkaline Phosphatase: 93 U/L (ref 38–126)
Anion gap: 7 (ref 5–15)
BUN: 44 mg/dL — AB (ref 6–20)
CHLORIDE: 107 mmol/L (ref 101–111)
CO2: 21 mmol/L — AB (ref 22–32)
CREATININE: 2.19 mg/dL — AB (ref 0.61–1.24)
Calcium: 8.5 mg/dL — ABNORMAL LOW (ref 8.9–10.3)
GFR calc Af Amer: 37 mL/min — ABNORMAL LOW (ref >60)
GFR calc non Af Amer: 32 mL/min — ABNORMAL LOW (ref >60)
GLUCOSE: 384 mg/dL — AB (ref 65–99)
POTASSIUM: 5.4 mmol/L — AB (ref 3.5–5.1)
SODIUM: 135 mmol/L (ref 135–145)
Total Bilirubin: 0.5 mg/dL (ref 0.3–1.2)
Total Protein: 7.4 g/dL (ref 6.5–8.1)

## 2016-02-26 LAB — CBC
HCT: 34.2 % — ABNORMAL LOW (ref 40.0–52.0)
Hemoglobin: 11.2 g/dL — ABNORMAL LOW (ref 13.0–18.0)
MCH: 29.7 pg (ref 26.0–34.0)
MCHC: 32.8 g/dL (ref 32.0–36.0)
MCV: 90.6 fL (ref 80.0–100.0)
PLATELETS: 316 10*3/uL (ref 150–440)
RBC: 3.78 MIL/uL — AB (ref 4.40–5.90)
RDW: 14.2 % (ref 11.5–14.5)
WBC: 13.9 10*3/uL — AB (ref 3.8–10.6)

## 2016-02-26 LAB — LACTIC ACID, PLASMA: Lactic Acid, Venous: 1.2 mmol/L (ref 0.5–1.9)

## 2016-02-26 LAB — TROPONIN I: TROPONIN I: 0.06 ng/mL — AB (ref <0.03)

## 2016-02-26 MED ORDER — ENALAPRILAT 1.25 MG/ML IV SOLN
0.6250 mg | Freq: Once | INTRAVENOUS | Status: AC
  Administered 2016-02-26: 0.625 mg via INTRAVENOUS
  Filled 2016-02-26: qty 2

## 2016-02-26 MED ORDER — FUROSEMIDE 10 MG/ML IJ SOLN
40.0000 mg | Freq: Once | INTRAMUSCULAR | Status: AC
  Administered 2016-02-26: 40 mg via INTRAVENOUS
  Filled 2016-02-26: qty 4

## 2016-02-26 MED ORDER — NITROGLYCERIN 0.4 MG SL SUBL
0.4000 mg | SUBLINGUAL_TABLET | SUBLINGUAL | Status: DC | PRN
  Administered 2016-02-26: 0.4 mg via SUBLINGUAL

## 2016-02-26 MED ORDER — MAGNESIUM SULFATE 2 GM/50ML IV SOLN
2.0000 g | Freq: Once | INTRAVENOUS | Status: AC
  Administered 2016-02-26: 2 g via INTRAVENOUS
  Filled 2016-02-26: qty 50

## 2016-02-26 MED ORDER — IPRATROPIUM-ALBUTEROL 0.5-2.5 (3) MG/3ML IN SOLN
3.0000 mL | Freq: Once | RESPIRATORY_TRACT | Status: AC
  Administered 2016-02-26: 3 mL via RESPIRATORY_TRACT
  Filled 2016-02-26: qty 3

## 2016-02-26 MED ORDER — NITROGLYCERIN 2 % TD OINT
1.0000 [in_us] | TOPICAL_OINTMENT | Freq: Once | TRANSDERMAL | Status: DC
  Filled 2016-02-26: qty 1

## 2016-02-26 MED ORDER — INSULIN ASPART 100 UNIT/ML ~~LOC~~ SOLN
0.0000 [IU] | Freq: Three times a day (TID) | SUBCUTANEOUS | Status: DC
  Administered 2016-02-27 (×3): 20 [IU] via SUBCUTANEOUS
  Administered 2016-02-28: 15 [IU] via SUBCUTANEOUS
  Administered 2016-02-28: 20 [IU] via SUBCUTANEOUS
  Filled 2016-02-26 (×3): qty 20
  Filled 2016-02-26: qty 15
  Filled 2016-02-26: qty 20

## 2016-02-26 MED ORDER — METHYLPREDNISOLONE SODIUM SUCC 125 MG IJ SOLR
125.0000 mg | Freq: Once | INTRAMUSCULAR | Status: AC
  Administered 2016-02-26: 125 mg via INTRAVENOUS
  Filled 2016-02-26: qty 2

## 2016-02-26 MED ORDER — INSULIN ASPART 100 UNIT/ML ~~LOC~~ SOLN
0.0000 [IU] | Freq: Every day | SUBCUTANEOUS | Status: DC
  Administered 2016-02-27: 5 [IU] via SUBCUTANEOUS
  Filled 2016-02-26: qty 5

## 2016-02-26 MED ORDER — NITROGLYCERIN 0.4 MG SL SUBL
SUBLINGUAL_TABLET | SUBLINGUAL | Status: AC
  Administered 2016-02-26: 0.4 mg via SUBLINGUAL
  Filled 2016-02-26: qty 1

## 2016-02-26 NOTE — ED Notes (Signed)
Gave pt apple juice  

## 2016-02-26 NOTE — ED Notes (Signed)
Patient tolerating bipap well at this time.  Respiratory at bedside to administer breathing treatments.

## 2016-02-26 NOTE — ED Notes (Signed)
Attempted to call report to the floor.  RN unavailable at this time and will return my call.

## 2016-02-26 NOTE — ED Provider Notes (Signed)
[facility] Emergency Department Provider Note   ____________________________________________    I have reviewed the triage vital signs and the nursing notes.   HISTORY  Chief Complaint Shortness of Breath   History is limited due to patient's distress  HPI Brian [name] is a 56 y.o. male who presents with shortness of breath. Patient reports a history of COPD diabetes and hypertension, he reports he was treated for pneumonia and COPD exacerbation in December of last year. He denies chest pain, he thinks his COPD is to blame for today's shortness of breath   Past Medical History:  Diagnosis Date  . COPD (chronic obstructive pulmonary disease) ([place])   . Diabetes mellitus type 2 in obese ([place])   . DM (diabetes mellitus) type II controlled with renal manifestation ([place])   . Hypercholesteremia   . Hypertension   . Morbid obesity with BMI of 45.0-49.9, adult ([place])   . Neuropathy ([place])   . Pancreatitis, acute   . Pneumonia     Patient Active Problem List   Diagnosis Date Noted  . COPD exacerbation ([place]) 02/08/2016  . DM (diabetes mellitus) type II controlled with renal manifestation ([place]) 01/05/2015  . Pancreatitis, acute   . Neuropathy ([place])   . Hypercholesteremia   . Clinical depression 01/17/2013  . HLD (hyperlipidemia) 01/17/2013  . BP (high blood pressure) 01/17/2013  . Gonalgia 01/17/2013  . Morbid obesity ([place]) 01/17/2013  . Compulsive tobacco user syndrome 01/17/2013  . Amputated toe ([place]) 01/17/2013  . Diabetes mellitus ([place]) 01/17/2013    Past Surgical History:  Procedure Laterality Date  . AMPUTATION    . CHOLECYSTECTOMY  1998  . LAPAROSCOPIC APPENDECTOMY N/A 01/06/2015   Procedure: APPENDECTOMY LAPAROSCOPIC drainage of peritoneal abcess;  Surgeon: Sherri Rad, MD;  Location: [facility] ORS;  Service: General;  Laterality: N/A;    Prior to Admission medications   Medication Sig Start Date End Date Taking? Authorizing Provider    atorvastatin (LIPITOR) 20 MG tablet Take 20 mg by mouth daily.    Historical Provider, MD  Fluticasone-Salmeterol (ADVAIR DISKUS) 250-50 MCG/DOSE AEPB Inhale 1 puff into the lungs 2 (two) times daily. 02/09/16   Nicholes Mango, MD  furosemide (LASIX) 20 MG tablet Take 20 mg by mouth daily.    Historical Provider, MD  gabapentin (NEURONTIN) 300 MG capsule Take 600 mg by mouth 3 (three) times daily. 11/13/13   Historical Provider, MD  insulin NPH-regular Human (NOVOLIN 70/30) (70-30) 100 UNIT/ML injection Inject 20 Units into the skin 2 (two) times daily with a meal. 02/09/16   Nicholes Mango, MD  levofloxacin (LEVAQUIN) 500 MG tablet Take 1 tablet (500 mg total) by mouth daily. 02/09/16   Nicholes Mango, MD  lisinopril (PRINIVIL,ZESTRIL) 20 MG tablet Take 20 mg by mouth daily.    Historical Provider, MD  metFORMIN (GLUCOPHAGE) 1000 MG tablet Take 1,000 mg by mouth 2 (two) times daily. 09/17/13   Historical Provider, MD  predniSONE (STERAPRED UNI-PAK 21 TAB) 10 MG (21) TBPK tablet Take 1 tablet (10 mg total) by mouth daily. Take 6 tablets by mouth for 1 day followed by  5 tablets by mouth for 1 day followed by  4 tablets by mouth for 1 day followed by  3 tablets by mouth for 1 day followed by  2 tablets by mouth for 1 day followed by  1 tablet by mouth for a day and stop 02/09/16   Nicholes Mango, MD  sertraline (ZOLOFT) 100 MG tablet Take 100 mg by  mouth daily. 04/10/13   Historical Provider, MD  tiotropium (SPIRIVA) 18 MCG inhalation capsule Place 1 capsule (18 mcg total) into inhaler and inhale daily. 02/10/16   Nicholes Mango, MD     Allergies Naproxen  Family History  Problem Relation Age of Onset  . Hypertension Mother   . Hyperlipidemia Mother   . Heart disease Father   . Heart disease Maternal Grandfather     Social History Social History  Substance Use Topics  . Smoking status: Current Every Day Smoker    Packs/day: 1.00    Types: Cigarettes  . Smokeless tobacco: Never Used  . Alcohol use  No    L5 caveat: Unable to obtain full Review of Systems due to patient distress  Constitutional: No fever/chills  Cardiovascular: Denies chest pain. Respiratory: Denies shortness of breath. Gastrointestinal: No abdominal pain.  No nausea, no vomiting.     ____________________________________________   PHYSICAL EXAM:  VITAL SIGNS: ED Triage Vitals [02/26/16 1822]  Enc Vitals Group     BP (!) 180/108     Pulse Rate (!) 118     Resp (!) 24     Temp 98.2 F (36.8 C)     Temp Source Oral     SpO2 (!) 76 %     Weight (!) 328 lb (148.8 kg)     Height 5\' 10"  (1.778 m)     Head Circumference      Peak Flow      Pain Score      Pain Loc      Pain Edu?      Excl. in [place]?     Constitutional: Ill-appearing, in moderate distress Eyes: Conjunctivae are normal.  Head: Atraumatic. Nose: No congestion/rhinnorhea. Mouth/Throat: Mucous membranes are moist.    Cardiovascular: Tachycardia, regular rhythm. Grossly normal heart sounds.  Good peripheral circulation. Respiratory: Increased respiratory effort, positive contraction, poor airflow, scattered wheezes, bibasilar rales Gastrointestinal: Soft and nontender. No distention.  No CVA tenderness. Genitourinary: deferred Musculoskeletal:  Warm and well perfused Neurologic:   No gross focal neurologic deficits are appreciated.  Skin:  Skin is warm, dry and intact. No rash noted.   ____________________________________________   LABS (all labs ordered are listed, but only abnormal results are displayed)  Labs Reviewed  CBC - Abnormal; Notable for the following:       Result Value   WBC 13.9 (*)    RBC 3.78 (*)    Hemoglobin 11.2 (*)    HCT 34.2 (*)    All other components within normal limits  COMPREHENSIVE METABOLIC PANEL - Abnormal; Notable for the following:    Potassium 5.4 (*)    CO2 21 (*)    Glucose, Bld 384 (*)    BUN 44 (*)    Creatinine, Ser 2.19 (*)    Calcium 8.5 (*)    Albumin 2.9 (*)    GFR calc non Af  Amer 32 (*)    GFR calc Af Amer 37 (*)    All other components within normal limits  TROPONIN I - Abnormal; Notable for the following:    Troponin I 0.06 (*)    All other components within normal limits  BRAIN NATRIURETIC PEPTIDE - Abnormal; Notable for the following:    B Natriuretic Peptide 450.0 (*)    All other components within normal limits  CULTURE, BLOOD (ROUTINE X 2)  CULTURE, BLOOD (ROUTINE X 2)  LACTIC ACID, PLASMA  LACTIC ACID, PLASMA   ____________________________________________  EKG  ED ECG  REPORT I, Lavonia Drafts, the attending physician, personally viewed and interpreted this ECG.  Date: 02/26/2016 EKG Time: 6:22 PM Rate: 117 Rhythm: Sinus tachycardia QRS Axis: normal Intervals: normal ST/T Wave abnormalities: Nonspecific Conduction Disturbances: none   ____________________________________________  RADIOLOGY  Chest x-ray shows moderate pulmonary edema ____________________________________________   PROCEDURES  Procedure(s) performed: No    Critical Care performed: yes  CRITICAL CARE Performed by: Lavonia Drafts   Total critical care time: 30 minutes  Critical care time was exclusive of separately billable procedures and treating other patients.  Critical care was necessary to treat or prevent imminent or life-threatening deterioration.  Critical care was time spent personally by me on the following activities: development of treatment plan with patient and/or surrogate as well as nursing, discussions with consultants, evaluation of patient's response to treatment, examination of patient, obtaining history from patient or surrogate, ordering and performing treatments and interventions, ordering and review of laboratory studies, ordering and review of radiographic studies, pulse oximetry and re-evaluation of patient's condition.  ____________________________________________   INITIAL IMPRESSION / ASSESSMENT AND PLAN / ED COURSE  Pertinent  labs & imaging results that were available during my care of the patient were reviewed by me and considered in my medical decision making (see chart for details).  Patient presents with acute respiratory distress. Initial pulse ox 76%, BiPAP ordered. Poor airflow bilaterally, scattered wheezes and bibasilar rales. Suspect COPD exacerbation. We will treat with Solu-Medrol DuoNeb's and IV magnesium  Clinical Course   ----------------------------------------- 7:01 PM on 02/26/2016 -----------------------------------------  Chest x-ray shows diffuse pulmonary edema, I ordered Nitropaste, Vasotec and Lasix. ____________________________________________ ----------------------------------------- 7:57 PM on 02/26/2016 -----------------------------------------  Patient appears improved, heart rate is improved. He feels better. We'll attempt to wean him off BiPAP.  ----------------------------------------- 8:32 PM on 02/26/2016 -----------------------------------------  Patient is tolerating. 4 L nasal cannula, admitted to the hospital service for further management  FINAL CLINICAL IMPRESSION(S) / ED DIAGNOSES  Final diagnoses:  Acute respiratory failure with hypoxia (HCC)  Acute pulmonary edema (HCC)  COPD exacerbation (HCC)      NEW MEDICATIONS STARTED DURING THIS VISIT:  New Prescriptions   No medications on file     Note:  This document was prepared using Dragon voice recognition software and may include unintentional dictation errors.    Lavonia Drafts, MD 02/26/16 2032

## 2016-02-26 NOTE — ED Triage Notes (Signed)
Pt comes into the ED via EMS from [place] parking lot where he became short of breath.  Patient presents labored with dyspnea at rest.  Patient has h/o COPD and was just released from the hospital for admission of pneumonia.

## 2016-02-27 LAB — BASIC METABOLIC PANEL
Anion gap: 8 (ref 5–15)
Anion gap: 10 (ref 5–15)
BUN: 17 mg/dL (ref 6–20)
BUN: 48 mg/dL — AB (ref 6–20)
CALCIUM: 8.8 mg/dL — AB (ref 8.9–10.3)
CHLORIDE: 103 mmol/L (ref 101–111)
CO2: 26 mmol/L (ref 22–32)
CO2: 21 mmol/L — ABNORMAL LOW (ref 22–32)
CREATININE: 2.29 mg/dL — AB (ref 0.61–1.24)
Calcium: 8.7 mg/dL — ABNORMAL LOW (ref 8.9–10.3)
Chloride: 104 mmol/L (ref 101–111)
Creatinine, Ser: 2.29 mg/dL — ABNORMAL HIGH (ref 0.61–1.24)
GFR calc Af Amer: 35 mL/min — ABNORMAL LOW (ref >60)
GFR calc non Af Amer: 30 mL/min — ABNORMAL LOW (ref >60)
GFR, EST AFRICAN AMERICAN: 35 mL/min — AB (ref >60)
GFR, EST NON AFRICAN AMERICAN: 30 mL/min — AB (ref >60)
Glucose, Bld: 92 mg/dL (ref 65–99)
Glucose, Bld: 485 mg/dL — ABNORMAL HIGH (ref 65–99)
POTASSIUM: 6.6 mmol/L — AB (ref 3.5–5.1)
Potassium: 4.1 mmol/L (ref 3.5–5.1)
SODIUM: 134 mmol/L — AB (ref 135–145)
SODIUM: 138 mmol/L (ref 135–145)

## 2016-02-27 LAB — CBC
HCT: 38.2 % — ABNORMAL LOW (ref 40.0–52.0)
HCT: 33.6 % — ABNORMAL LOW (ref 40.0–52.0)
HEMOGLOBIN: 11 g/dL — AB (ref 13.0–18.0)
Hemoglobin: 13 g/dL (ref 13.0–18.0)
MCH: 31.5 pg (ref 26.0–34.0)
MCH: 29.5 pg (ref 26.0–34.0)
MCHC: 33.9 g/dL (ref 32.0–36.0)
MCHC: 32.7 g/dL (ref 32.0–36.0)
MCV: 92.7 fL (ref 80.0–100.0)
MCV: 90.3 fL (ref 80.0–100.0)
PLATELETS: 353 10*3/uL (ref 150–440)
Platelets: 307 10*3/uL (ref 150–440)
RBC: 3.72 MIL/uL — AB (ref 4.40–5.90)
RBC: 4.12 MIL/uL — AB (ref 4.40–5.90)
RDW: 13.6 % (ref 11.5–14.5)
RDW: 14.2 % (ref 11.5–14.5)
WBC: 16.3 10*3/uL — ABNORMAL HIGH (ref 3.8–10.6)
WBC: 4.6 10*3/uL (ref 3.8–10.6)

## 2016-02-27 LAB — LIPID PANEL
CHOL/HDL RATIO: 2.9 ratio
Cholesterol: 175 mg/dL (ref 0–200)
HDL: 60 mg/dL (ref >40)
LDL CALC: 77 mg/dL (ref 0–99)
TRIGLYCERIDES: 191 mg/dL — AB (ref <150)
VLDL: 38 mg/dL (ref 0–40)

## 2016-02-27 LAB — GLUCOSE, CAPILLARY
GLUCOSE-CAPILLARY: 488 mg/dL — AB (ref 65–99)
GLUCOSE-CAPILLARY: 404 mg/dL — AB (ref 65–99)
GLUCOSE-CAPILLARY: 435 mg/dL — AB (ref 65–99)
GLUCOSE-CAPILLARY: 364 mg/dL — AB (ref 65–99)
Glucose-Capillary: 499 mg/dL — ABNORMAL HIGH (ref 65–99)
Glucose-Capillary: 404 mg/dL — ABNORMAL HIGH (ref 65–99)
Glucose-Capillary: 323 mg/dL — ABNORMAL HIGH (ref 65–99)
Glucose-Capillary: 409 mg/dL — ABNORMAL HIGH (ref 65–99)

## 2016-02-27 LAB — TSH: TSH: 1.865 u[IU]/mL (ref 0.350–4.500)

## 2016-02-27 LAB — APTT: aPTT: 46 seconds — ABNORMAL HIGH (ref 24–36)

## 2016-02-27 LAB — POTASSIUM: POTASSIUM: 5.7 mmol/L — AB (ref 3.5–5.1)

## 2016-02-27 LAB — PROTIME-INR
INR: 1.09
Prothrombin Time: 14.1 seconds (ref 11.4–15.2)

## 2016-02-27 LAB — TROPONIN I
TROPONIN I: 0.1 ng/mL — AB (ref <0.03)
TROPONIN I: 0.1 ng/mL — AB (ref <0.03)
Troponin I: 0.07 ng/mL (ref <0.03)

## 2016-02-27 LAB — HEPARIN LEVEL (UNFRACTIONATED): HEPARIN UNFRACTIONATED: 0.11 [IU]/mL — AB (ref 0.30–0.70)

## 2016-02-27 LAB — BRAIN NATRIURETIC PEPTIDE: B NATRIURETIC PEPTIDE 5: 675 pg/mL — AB (ref 0.0–100.0)

## 2016-02-27 MED ORDER — GABAPENTIN 300 MG PO CAPS
600.0000 mg | ORAL_CAPSULE | Freq: Three times a day (TID) | ORAL | Status: DC
  Administered 2016-02-27 – 2016-02-28 (×5): 600 mg via ORAL
  Filled 2016-02-27 (×5): qty 2

## 2016-02-27 MED ORDER — ASPIRIN EC 81 MG PO TBEC
81.0000 mg | DELAYED_RELEASE_TABLET | Freq: Every day | ORAL | Status: DC
  Administered 2016-02-27 – 2016-02-28 (×2): 81 mg via ORAL
  Filled 2016-02-27 (×2): qty 1

## 2016-02-27 MED ORDER — ALPRAZOLAM 0.25 MG PO TABS
0.2500 mg | ORAL_TABLET | Freq: Two times a day (BID) | ORAL | Status: DC | PRN
  Filled 2016-02-27: qty 1

## 2016-02-27 MED ORDER — ATORVASTATIN CALCIUM 20 MG PO TABS
20.0000 mg | ORAL_TABLET | Freq: Every day | ORAL | Status: DC
  Administered 2016-02-27 (×2): 20 mg via ORAL
  Filled 2016-02-27 (×2): qty 1

## 2016-02-27 MED ORDER — HEPARIN BOLUS VIA INFUSION
3200.0000 [IU] | Freq: Once | INTRAVENOUS | Status: AC
  Administered 2016-02-27: 3200 [IU] via INTRAVENOUS
  Filled 2016-02-27: qty 3200

## 2016-02-27 MED ORDER — TIOTROPIUM BROMIDE MONOHYDRATE 18 MCG IN CAPS
18.0000 ug | ORAL_CAPSULE | Freq: Every day | RESPIRATORY_TRACT | Status: DC
  Administered 2016-02-27 – 2016-02-28 (×2): 18 ug via RESPIRATORY_TRACT
  Filled 2016-02-27: qty 5

## 2016-02-27 MED ORDER — SODIUM CHLORIDE 0.9% FLUSH
3.0000 mL | INTRAVENOUS | Status: DC | PRN
  Administered 2016-02-27: 3 mL via INTRAVENOUS
  Filled 2016-02-27: qty 3

## 2016-02-27 MED ORDER — LISINOPRIL 20 MG PO TABS
20.0000 mg | ORAL_TABLET | Freq: Every day | ORAL | Status: DC
  Administered 2016-02-27 (×2): 20 mg via ORAL
  Filled 2016-02-27 (×2): qty 1

## 2016-02-27 MED ORDER — METHYLPREDNISOLONE SODIUM SUCC 125 MG IJ SOLR
60.0000 mg | Freq: Four times a day (QID) | INTRAMUSCULAR | Status: DC
  Administered 2016-02-27 (×3): 60 mg via INTRAVENOUS
  Filled 2016-02-27 (×3): qty 2

## 2016-02-27 MED ORDER — IPRATROPIUM-ALBUTEROL 0.5-2.5 (3) MG/3ML IN SOLN: 3.0000 mL | Freq: Four times a day (QID) | RESPIRATORY_TRACT | Status: DC | PRN

## 2016-02-27 MED ORDER — INSULIN ASPART 100 UNIT/ML ~~LOC~~ SOLN
10.0000 [IU] | Freq: Once | SUBCUTANEOUS | Status: AC
  Administered 2016-02-27: 10 [IU] via SUBCUTANEOUS
  Filled 2016-02-27: qty 10

## 2016-02-27 MED ORDER — FUROSEMIDE 10 MG/ML IJ SOLN
40.0000 mg | Freq: Every day | INTRAMUSCULAR | Status: DC
  Administered 2016-02-27: 40 mg via INTRAVENOUS
  Filled 2016-02-27: qty 4

## 2016-02-27 MED ORDER — INSULIN ASPART 100 UNIT/ML ~~LOC~~ SOLN
14.0000 [IU] | Freq: Once | SUBCUTANEOUS | Status: AC
  Administered 2016-02-27: 14 [IU] via SUBCUTANEOUS
  Filled 2016-02-27: qty 14

## 2016-02-27 MED ORDER — HEPARIN BOLUS VIA INFUSION
4000.0000 [IU] | Freq: Once | INTRAVENOUS | Status: AC
  Administered 2016-02-27: 4000 [IU] via INTRAVENOUS
  Filled 2016-02-27: qty 4000

## 2016-02-27 MED ORDER — CYCLOBENZAPRINE HCL 10 MG PO TABS
5.0000 mg | ORAL_TABLET | Freq: Every day | ORAL | Status: DC
  Administered 2016-02-27 (×2): 5 mg via ORAL
  Filled 2016-02-27 (×2): qty 1

## 2016-02-27 MED ORDER — ACETAMINOPHEN 325 MG PO TABS: 650.0000 mg | ORAL_TABLET | ORAL | Status: DC | PRN

## 2016-02-27 MED ORDER — ENOXAPARIN SODIUM 40 MG/0.4ML ~~LOC~~ SOLN: 40.0000 mg | Freq: Two times a day (BID) | SUBCUTANEOUS | Status: DC

## 2016-02-27 MED ORDER — DEXTROSE 50 % IV SOLN
1.0000 | INTRAVENOUS | Status: AC
  Administered 2016-02-27: 50 mL via INTRAVENOUS
  Filled 2016-02-27: qty 50

## 2016-02-27 MED ORDER — ZOLPIDEM TARTRATE 5 MG PO TABS
5.0000 mg | ORAL_TABLET | Freq: Every evening | ORAL | Status: DC | PRN
  Administered 2016-02-27: 5 mg via ORAL
  Filled 2016-02-27: qty 1

## 2016-02-27 MED ORDER — METHYLPREDNISOLONE SODIUM SUCC 125 MG IJ SOLR: 60.0000 mg | Freq: Every day | INTRAMUSCULAR | Status: DC

## 2016-02-27 MED ORDER — SERTRALINE HCL 100 MG PO TABS
100.0000 mg | ORAL_TABLET | Freq: Every day | ORAL | Status: DC
  Administered 2016-02-27 – 2016-02-28 (×2): 100 mg via ORAL
  Filled 2016-02-27 (×2): qty 1

## 2016-02-27 MED ORDER — FUROSEMIDE 10 MG/ML IJ SOLN
40.0000 mg | Freq: Two times a day (BID) | INTRAMUSCULAR | Status: DC
  Administered 2016-02-27 – 2016-02-28 (×2): 40 mg via INTRAVENOUS
  Filled 2016-02-27 (×2): qty 4

## 2016-02-27 MED ORDER — ALBUTEROL SULFATE (2.5 MG/3ML) 0.083% IN NEBU
2.5000 mg | INHALATION_SOLUTION | Freq: Four times a day (QID) | RESPIRATORY_TRACT | Status: DC | PRN
Start: 2016-02-27 — End: 2016-02-28

## 2016-02-27 MED ORDER — INSULIN ASPART 100 UNIT/ML IV SOLN
20.0000 [IU] | INTRAVENOUS | Status: AC
  Administered 2016-02-27: 20 [IU] via INTRAVENOUS
  Filled 2016-02-27: qty 0.2

## 2016-02-27 MED ORDER — SODIUM CHLORIDE 0.9 % IV SOLN: 250.0000 mL | INTRAVENOUS | Status: DC | PRN

## 2016-02-27 MED ORDER — ONDANSETRON HCL 4 MG/2ML IJ SOLN
4.0000 mg | Freq: Four times a day (QID) | INTRAMUSCULAR | Status: DC | PRN
  Administered 2016-02-27: 4 mg via INTRAVENOUS
  Filled 2016-02-27: qty 2

## 2016-02-27 MED ORDER — SODIUM POLYSTYRENE SULFONATE 15 GM/60ML PO SUSP
30.0000 g | ORAL | Status: AC
  Administered 2016-02-27: 30 g via ORAL
  Filled 2016-02-27: qty 120

## 2016-02-27 MED ORDER — SODIUM CHLORIDE 0.9% FLUSH
3.0000 mL | Freq: Two times a day (BID) | INTRAVENOUS | Status: DC
  Administered 2016-02-27 – 2016-02-28 (×4): 3 mL via INTRAVENOUS

## 2016-02-27 MED ORDER — CARVEDILOL 3.125 MG PO TABS
3.1250 mg | ORAL_TABLET | Freq: Two times a day (BID) | ORAL | Status: DC
  Administered 2016-02-27 – 2016-02-28 (×3): 3.125 mg via ORAL
  Filled 2016-02-27 (×3): qty 1

## 2016-02-27 MED ORDER — MOMETASONE FURO-FORMOTEROL FUM 200-5 MCG/ACT IN AERO
2.0000 | INHALATION_SPRAY | Freq: Two times a day (BID) | RESPIRATORY_TRACT | Status: DC
  Administered 2016-02-27 – 2016-02-28 (×4): 2 via RESPIRATORY_TRACT
  Filled 2016-02-27: qty 8.8

## 2016-02-27 MED ORDER — INSULIN GLARGINE 100 UNIT/ML ~~LOC~~ SOLN
28.0000 [IU] | Freq: Every day | SUBCUTANEOUS | Status: DC
  Administered 2016-02-27: 28 [IU] via SUBCUTANEOUS
  Filled 2016-02-27 (×2): qty 0.28

## 2016-02-27 MED ORDER — HEPARIN (PORCINE) IN NACL 100-0.45 UNIT/ML-% IJ SOLN
1600.0000 [IU]/h | INTRAMUSCULAR | Status: DC
  Administered 2016-02-27: 1200 [IU]/h via INTRAVENOUS
  Filled 2016-02-27: qty 250

## 2016-02-27 NOTE — Progress Notes (Signed)
ANTICOAGULATION CONSULT NOTE - Initial Consult  Pharmacy Consult for heparin Indication: chest pain/ACS  Allergies  Allergen Reactions  . Naproxen Rash    Patient Measurements: Height: 5\' 10"  (177.8 cm) Weight: (!) 328 lb (148.8 kg) IBW/kg (Calculated) : 73 Heparin Dosing Weight: 108.5 kg  Vital Signs: Temp: 98.3 F (36.8 C) (01/05 0046) Temp Source: Oral (01/05 0046) BP: 137/74 (01/05 0046) Pulse Rate: 81 (01/05 0046)  Labs:  Recent Labs  02/26/16 1823 02/27/16 0045  HGB 11.2* 13.0  HCT 34.2* 38.2*  PLT 316 353  CREATININE 2.19*  --   TROPONINI 0.06* 0.10*    Estimated Creatinine Clearance: 55 mL/min (by C-G formula based on SCr of 2.19 mg/dL (H)).   Medical History: Past Medical History:  Diagnosis Date  . COPD (chronic obstructive pulmonary disease) ([place])   . Diabetes mellitus type 2 in obese ([place])   . DM (diabetes mellitus) type II controlled with renal manifestation ([place])   . Hypercholesteremia   . Hypertension   . Morbid obesity with BMI of 45.0-49.9, adult ([place])   . Neuropathy ([place])   . Pancreatitis, acute   . Pneumonia     Medications:  Infusions:  . heparin      Assessment: 53 yom with new CHF and rising troponin. Pharmacy consulted to dose heparin for ACS.  Goal of Therapy:  Heparin level 0.3-0.7 units/ml Monitor platelets by anticoagulation protocol: Yes   Plan:  Give 4000 units bolus x 1 Start heparin infusion at 1200 units/hr Check anti-Xa level in 6 hours and daily while on heparin Continue to monitor H&H and platelets  Laural Benes, Pharm.D., BCPS Clinical Pharmacist 02/27/2016,3:01 AM

## 2016-02-27 NOTE — Progress Notes (Signed)
ANTICOAGULATION CONSULT NOTE - Initial Consult  Pharmacy Consult for heparin Indication: chest pain/ACS  Allergies  Allergen Reactions  . Naproxen Rash    Patient Measurements: Height: 5\' 10"  (177.8 cm) Weight: (!) 323 lb 6.4 oz (146.7 kg) IBW/kg (Calculated) : 73 Heparin Dosing Weight: 108.5 kg  Vital Signs: Temp: 97.8 F (36.6 C) (01/05 0837) Temp Source: Oral (01/05 0837) BP: 153/90 (01/05 0837) Pulse Rate: 107 (01/05 0837)  Labs:  Recent Labs  02/26/16 1823 02/27/16 0045 02/27/16 0729 02/27/16 0914  HGB 11.2* 13.0 11.0*  --   HCT 34.2* 38.2* 33.6*  --   PLT 316 353 307  --   APTT  --   --  46*  --   LABPROT  --   --  14.1  --   INR  --   --  1.09  --   HEPARINUNFRC  --   --   --  0.11*  CREATININE 2.19* 2.29* 2.29*  --   TROPONINI 0.06* 0.10* 0.07*  --     Estimated Creatinine Clearance: 52.2 mL/min (by C-G formula based on SCr of 2.29 mg/dL (H)).   Medical History: Past Medical History:  Diagnosis Date  . COPD (chronic obstructive pulmonary disease) ([place])   . Diabetes mellitus type 2 in obese ([place])   . DM (diabetes mellitus) type II controlled with renal manifestation ([place])   . Hypercholesteremia   . Hypertension   . Morbid obesity with BMI of 45.0-49.9, adult ([place])   . Neuropathy ([place])   . Pancreatitis, acute   . Pneumonia     Medications:  Infusions:  . heparin 1,200 Units/hr (02/27/16 0356)    Assessment: 55 yom with new CHF and rising troponin. Pharmacy consulted to dose heparin for ACS.  Goal of Therapy:  Heparin level 0.3-0.7 units/ml Monitor platelets by anticoagulation protocol: Yes   Plan:  Current orders for heparin 1200 units/hr. Heparin level subtherapeutic. Will give bolus of 3200 units x 1 and increase rate to 1600 units/hr. Recheck heparin level in 6 hours  Rexene Edison, PharmD, BCPS Clinical Pharmacist  02/27/2016 10:30 AM

## 2016-02-27 NOTE — H&P (Signed)
History and Physical   [facility] - [place] @ [facility] Admission History and Physical [employer], D.O.    Patient Name: Brian [name] MR#: [id number] Date of Birth: [birth year] Date of Admission: 02/26/2016  Referring MD/NP/PA: Dr. Corky Downs Primary Care Physician: Donnie Coffin, MD Patient coming from: Home  Chief Complaint: SOB, LE edema  HPI: Brian [name] is a 56 y.o. male with a known history of COPD, diabetes, hypertension, hyperlipidemia, recent admission to ICU for sepsis secondary to pneumonia and COPD exacerbation presents to the emergency department for evaluation of shortness of breath.  Patient was in a usual state of health until yesterday when he describes progressively worsening shortness of breath associated with dyspnea on exertion. He reports several weeks of worsening bilateral lower extremity edema. Of note he was admitted December 18 to the ICU for sepsis secondary to community acquired pneumonia and COPD exacerbation..   Otherwise there has been no change in status. Patient has been taking medication as prescribed and there has been no recent change in medication or diet.  There has been no recent illness, travel or sick contacts.  He states he has not had any heart workup, echocardiogram or stress test.  Patient denies fevers/chills, weakness, dizziness, chest pain, N/V/C/D, abdominal pain, dysuria/frequency, changes in mental status.   In the emergency department patient received nebulizer therapy, IV magnesium and steroids. He was treated with BiPAP or initial pulse ox of 76% on room air and improved significantly to the point where he was able to titrate down to 2 L O2 via nasal cannula and maintain a sat of 93% or greater. After her BNP came back elevated and chest x-ray showed over pulmonary edema patient also received IV enalapril, Lasix and Nitropaste.  Review of Systems:  CONSTITUTIONAL: No fever/chills, fatigue, weakness, weight gain/loss,  headache. EYES: No blurry or double vision. ENT: No tinnitus, postnasal drip, redness or soreness of the oropharynx. RESPIRATORY: No cough, , wheeze, hemoptysis. Positive dyspnea on exertion CARDIOVASCULAR: No chest pain, palpitations, syncope, orthopnea,  GASTROINTESTINAL: No nausea, vomiting, abdominal pain, constipation, diarrhea.  No hematemesis, melena or hematochezia. GENITOURINARY: No dysuria, frequency, hematuria. ENDOCRINE: No polyuria or nocturia. No heat or cold intolerance. HEMATOLOGY: No anemia, bruising, bleeding. INTEGUMENTARY: No rashes, ulcers, lesions. MUSCULOSKELETAL: No arthritis, gout, dyspnea. Positive bilateral lower extremity edema NEUROLOGIC: No numbness, tingling, ataxia, seizure-type activity, weakness. PSYCHIATRIC: No anxiety, depression, insomnia.   Past Medical History:  Diagnosis Date  . COPD (chronic obstructive pulmonary disease) ([place])   . Diabetes mellitus type 2 in obese ([place])   . DM (diabetes mellitus) type II controlled with renal manifestation ([place])   . Hypercholesteremia   . Hypertension   . Morbid obesity with BMI of 45.0-49.9, adult ([place])   . Neuropathy ([place])   . Pancreatitis, acute   . Pneumonia     Past Surgical History:  Procedure Laterality Date  . AMPUTATION    . CHOLECYSTECTOMY  1998  . LAPAROSCOPIC APPENDECTOMY N/A 01/06/2015   Procedure: APPENDECTOMY LAPAROSCOPIC drainage of peritoneal abcess;  Surgeon: Sherri Rad, MD;  Location: [facility] ORS;  Service: General;  Laterality: N/A;     reports that he has been smoking Cigarettes.  He has been smoking about 1.00 pack per day. He has never used smokeless tobacco. He reports that he does not drink alcohol or use drugs.  Allergies  Allergen Reactions  . Naproxen Rash    Family History  Problem Relation Age of Onset  . Hypertension Mother   . Hyperlipidemia Mother   .  Heart disease Father   . Heart disease Maternal Grandfather    Family history has been reviewed and confirmed with  patient.   Prior to Admission medications   Medication Sig Start Date End Date Taking? Authorizing Provider  atorvastatin (LIPITOR) 20 MG tablet Take 20 mg by mouth daily.   Yes Historical Provider, MD  cyclobenzaprine (FLEXERIL) 5 MG tablet Take 5 mg by mouth at bedtime.   Yes Historical Provider, MD  Fluticasone-Salmeterol (ADVAIR DISKUS) 250-50 MCG/DOSE AEPB Inhale 1 puff into the lungs 2 (two) times daily. 02/09/16  Yes Nicholes Mango, MD  furosemide (LASIX) 20 MG tablet Take 20 mg by mouth daily.   Yes Historical Provider, MD  gabapentin (NEURONTIN) 300 MG capsule Take 600 mg by mouth 3 (three) times daily. 11/13/13  Yes Historical Provider, MD  insulin NPH-regular Human (NOVOLIN 70/30) (70-30) 100 UNIT/ML injection Inject 20 Units into the skin 2 (two) times daily with a meal. 02/09/16  Yes Nicholes Mango, MD  lisinopril (PRINIVIL,ZESTRIL) 20 MG tablet Take 20 mg by mouth daily.   Yes Historical Provider, MD  metFORMIN (GLUCOPHAGE) 1000 MG tablet Take 1,000 mg by mouth daily with breakfast.  09/17/13  Yes Historical Provider, MD  sertraline (ZOLOFT) 100 MG tablet Take 100 mg by mouth daily. 04/10/13  Yes Historical Provider, MD  levofloxacin (LEVAQUIN) 500 MG tablet Take 1 tablet (500 mg total) by mouth daily. Patient not taking: Reported on 02/26/2016 02/09/16   Nicholes Mango, MD  predniSONE (STERAPRED UNI-PAK 21 TAB) 10 MG (21) TBPK tablet Take 1 tablet (10 mg total) by mouth daily. Take 6 tablets by mouth for 1 day followed by  5 tablets by mouth for 1 day followed by  4 tablets by mouth for 1 day followed by  3 tablets by mouth for 1 day followed by  2 tablets by mouth for 1 day followed by  1 tablet by mouth for a day and stop Patient not taking: Reported on 02/26/2016 02/09/16   Nicholes Mango, MD  tiotropium (SPIRIVA) 18 MCG inhalation capsule Place 1 capsule (18 mcg total) into inhaler and inhale daily. Patient not taking: Reported on 02/26/2016 02/10/16   Nicholes Mango, MD    Physical  Exam: Vitals:   02/26/16 2230 02/26/16 2300 02/26/16 2330 02/27/16 0046  BP: (!) 152/95 138/76 (!) 161/91 137/74  Pulse: 97 98 100 81  Resp: (!) 21 20 20 18   Temp:    98.3 F (36.8 C)  TempSrc:    Oral  SpO2: 96% 97% 95% 92%  Weight:      Height:        GENERAL: 56 y.o.-year-old Morbidly obese white male patient, well-developed, well-nourished lying in the bed in no acute distress.  Pleasant and cooperative.   HEENT: Head atraumatic, normocephalic. Pupils equal, round, reactive to light and accommodation. No scleral icterus. Extraocular muscles intact. Nares are patent. Oropharynx is clear. Mucus membranes moist. NECK: Supple, full range of motion. No JVD, no bruit heard. No thyroid enlargement, no tenderness, no cervical lymphadenopathy. CHEST: Bibasilar rales with mild diffuse extremity wheezing. Good air movement No use of accessory muscles of respiration.  No reproducible chest wall tenderness.  CARDIOVASCULAR: S1, S2 normal. No murmurs, rubs, or gallops. Cap refill <2 seconds. Pulses intact distally.  ABDOMEN: Soft, nondistended, nontender, . No rebound, guarding, rigidity. Normoactive bowel sounds present in all four quadrants. No organomegaly or mass. EXTREMITIES: Positive pitting edema bilateral lower extremities to mid calf. NEUROLOGIC: Cranial nerves II through XII are grossly intact  with no focal sensorimotor deficit. Muscle strength 5/5 in all extremities. Sensation intact. Gait not checked. PSYCHIATRIC: The patient is alert and oriented x 3. Normal affect, mood, thought content. SKIN: Warm, dry, and intact without obvious rash, lesion, or ulcer. Patient has a superficial, flesh-colored mass in the submandibular region of the neck which is chronic.   Labs on Admission: I have personally reviewed following labs and imaging studies  CBC:  Recent Labs Lab 02/26/16 1823 02/27/16 0045  WBC 13.9* 4.6  HGB 11.2* 13.0  HCT 34.2* 38.2*  MCV 90.6 92.7  PLT 316 751   Basic  Metabolic Panel:  Recent Labs Lab 02/26/16 1823  NA 135  K 5.4*  CL 107  CO2 21*  GLUCOSE 384*  BUN 44*  CREATININE 2.19*  CALCIUM 8.5*   GFR: Estimated Creatinine Clearance: 55 mL/min (by C-G formula based on SCr of 2.19 mg/dL (H)). Liver Function Tests:  Recent Labs Lab 02/26/16 1823  AST 21  ALT 21  ALKPHOS 93  BILITOT 0.5  PROT 7.4  ALBUMIN 2.9*   No results for input(s): LIPASE, AMYLASE in the last 168 hours. No results for input(s): AMMONIA in the last 168 hours. Coagulation Profile: No results for input(s): INR, PROTIME in the last 168 hours. Cardiac Enzymes:  Recent Labs Lab 02/26/16 1823  TROPONINI 0.06*   BNP (last 3 results) No results for input(s): PROBNP in the last 8760 hours. HbA1C: No results for input(s): HGBA1C in the last 72 hours. CBG:  Recent Labs Lab 02/27/16 0049 02/27/16 0229  GLUCAP 499* 488*   Lipid Profile: No results for input(s): CHOL, HDL, LDLCALC, TRIG, CHOLHDL, LDLDIRECT in the last 72 hours. Thyroid Function Tests: No results for input(s): TSH, T4TOTAL, FREET4, T3FREE, THYROIDAB in the last 72 hours. Anemia Panel: No results for input(s): VITAMINB12, FOLATE, FERRITIN, TIBC, IRON, RETICCTPCT in the last 72 hours. Urine analysis:    Component Value Date/Time   COLORURINE YELLOW (A) 08/12/2014 1852   APPEARANCEUR CLOUDY (A) 08/12/2014 1852   APPEARANCEUR Clear 08/24/2012 2354   LABSPEC 1.037 (H) 08/12/2014 1852   LABSPEC 1.020 08/24/2012 2354   PHURINE 5.0 08/12/2014 1852   GLUCOSEU >500 (A) 08/12/2014 1852   GLUCOSEU Negative 08/24/2012 2354   HGBUR 1+ (A) 08/12/2014 1852   BILIRUBINUR NEGATIVE 08/12/2014 1852   BILIRUBINUR Negative 08/24/2012 2354   KETONESUR NEGATIVE 08/12/2014 1852   PROTEINUR >500 (A) 08/12/2014 1852   NITRITE NEGATIVE 08/12/2014 1852   LEUKOCYTESUR NEGATIVE 08/12/2014 1852   LEUKOCYTESUR Negative 08/24/2012 2354   Sepsis Labs: @LABRCNTIP (procalcitonin:4,lacticidven:4) )No results found  for this or any previous visit (from the past 240 hour(s)).   Radiological Exams on Admission: Dg Chest Port 1 View  Result Date: 02/26/2016 CLINICAL DATA:  Initial evaluation for acute shortness of breath. EXAM: PORTABLE CHEST 1 VIEW COMPARISON:  Prior radiograph from 02/09/2016. FINDINGS: Cardiomegaly stable from prior. Mediastinal silhouette within normal limits. Lungs normally inflated. Diffuse pulmonary vascular congestion with interstitial prominence, compatible with pulmonary edema. No definite pleural effusion or focal infiltrates. No pneumothorax. No acute osseous abnormality para IMPRESSION: Cardiomegaly with moderate diffuse pulmonary edema. Electronically Signed   By: Jeannine Boga M.D.   On: 02/26/2016 18:41    EKG: Sinus tachycardia 117 bpm with normal axis and nonspecific ST-T wave changes.   ECHO 02/09/16 Study Conclusions  - Procedure narrative: Transthoracic echocardiography. Image   quality was suboptimal. The study was technically difficult. - Left ventricle: The cavity size was normal. There was mild   concentric  hypertrophy. Systolic function was normal. The   estimated ejection fraction was in the range of 55% to 60%. The   study is not technically sufficient to allow evaluation of LV   diastolic function. - Left atrium: The atrium was mildly dilated.  Assessment/Plan Active Problems:   Acute congestive heart failure ([place])    This is a 56 y.o. male with a history of COPD, CK D diabetes, hypertension, hyperlipidemia, , tobacco use disorder, recent admission to ICU for sepsis secondary to pneumonia and COPD exacerbation  now being admitted with:  1. Acute respiratory failure with hypoxia secondary to new onset of congestive heart failure - Telemetry monitoring. -Echo done 02/09/16 -Continue  lisinopril, added beta blocker - Diuresis, intake/output, daily weight. - Trend troponins, check lipids and TSH. - Cardiology consultation requested. -Patient  second troponin elevated to 0.1. May be demand ischemia however given recent findings Will start heparin drip and continued to trend troponins.  2. History of COPD - -Given history of COPD we'll also continue Advair, Spiriva steroids, Continuous pulse oximetry and nebulizers as needed 3. Hyperkalemia-repeat BMP in 4 hours and in a.m 4. History of diabetes-hold metformin, cover with regular insulin sliding scale coverage and check hemoglobin A1c 5. Chronic kidney disease, stable at baseline-monitor and repeat CBC in a.m 6. Anemia, chronic and stable. Repeat CBC in a.m. 7. History of hyperlipidemia-continue Lipitor 8. History of depression-continue Zoloft 9. Tobacco use disorder, severe-nicotine patch and cessation advised.  Admission status: Inpatient, telemetry IV Fluids: Hep-Lock Diet/Nutrition: Heart healthy, carb controlled Consults called: Cardiology DVT Px: Heparin drip, SCDs and early ambulation Code Status: Full Code  Disposition Plan: To home in 1-2   All the records are reviewed and case discussed with ED provider. Management plans discussed with the patient and/or family who express understanding and agree with plan of care.  Ralynn San D.O. on 02/27/2016 at 2:31 AM Between 7am to 6pm - Pager - [phone] After 6pm go to [email or web address] - Proofreader [facility] [place] [facility] Office [phone] CC: Primary care physician; Donnie Coffin, MD  Harvie Bridge MD [facility] Pager [phone]   If 7PM-7AM, please contact night-coverage [email or web address] Password [email or web address]  02/27/2016, 2:31 AM

## 2016-02-27 NOTE — Plan of Care (Signed)
Problem: Food- and Nutrition-Related Knowledge Deficit (NB-1.1) Goal: Nutrition education Formal process to instruct or train a patient/client in a skill or to impart knowledge to help patients/clients voluntarily manage or modify food choices and eating behavior to maintain or improve health. Outcome: Completed/Met Date Met: 02/27/16 Nutrition Education Note  RD consulted for nutrition education regarding new onset CHF.  RD provided "Heart Failure Nutrition Therapy" handout from the Academy of [employer]etics. Reviewed patient's dietary recall. Provided examples on ways to decrease sodium intake in diet. Discouraged intake of processed foods and use of salt shaker. Encouraged fresh fruits and vegetables as well as whole grain sources of carbohydrates to maximize fiber intake.   RD discussed why it is important for patient to adhere to diet recommendations, and emphasized the role of fluids, foods to avoid, and importance of weighing self daily. Teach back method used.  Also provided pt with "Carbohydrate Counting for People with Diabetes" from AND. Briefly reviewed basics of diabetic diet.   Expect good compliance.  Body mass index is 46.4 kg/m. Pt meets criteria for morbid obesity based on current BMI.  Current diet order is Heart Healthy/Carb Modified, patient is consuming approximately 100% of meals at this time. Labs and medications reviewed. No further nutrition interventions warranted at this time. RD contact information provided. If additional nutrition issues arise, please re-consult RD.   Kerman Passey [place], [place], LDN [phone] Pager  [phone] Weekend/On-Call Pager

## 2016-02-27 NOTE — Progress Notes (Signed)
CBG = 404.  At midnight serum glucose was 92, while CBG was 499. Serum glucose drawn this am at 0730.  Will wait to get these results before giving insulin.  Confirmed with glycemia control team.

## 2016-02-27 NOTE — Care Management (Signed)
New onset CHF.  Referral to heart failure clinic. 02 requirements are acute.  It is too early in the plan of care to perform home 02 assessment.

## 2016-02-27 NOTE — Progress Notes (Signed)
Inpatient Diabetes Program Recommendations  AACE/ADA: New Consensus Statement on Inpatient Glycemic Control (2015)  Target Ranges:  Prepandial:   less than 140 mg/dL      Peak postprandial:   less than 180 mg/dL (1-2 hours)      Critically ill patients:  140 - 180 mg/dL   Lab Results  Component Value Date   GLUCAP 404 (H) 02/27/2016   HGBA1C 11.7 (H) 01/05/2015    Review of Glycemic Control  Results for JONOTHAN, HEBERLE (MRN [id number]) as of 02/27/2016 08:43  Ref. Range 02/27/2016 00:49 02/27/2016 02:29 02/27/2016 05:42 02/27/2016 07:35  Glucose-Capillary Latest Ref Range: 65 - 99 mg/dL 499 (H) 488 (H) 404 (H) 404 (H)    Diabetes history: Type 2 Outpatient Diabetes medications: Novolin 70/30 20 units bid pre-meals, Metformin 1000mg  with breakfast  Current orders for Inpatient glycemic control: Novolog 0-20 units tid, Novolog 0-5 units qhs  Inpatient Diabetes Program Recommendations:  If this patient is eating well, consider starting him on 20 units Novolog 70/30 insulin bid.    If he is eating poorly, consider starting him on 25 units Lantus starting now and Novolog 4 units tid with meals (hold if he eats less than 50%)  Spoke to RN regarding recommendations. She confirms he is eating well and RN has noted that the patient was given a stat dose of Novolog at 0635 today.    Gentry Fitz, RN, BA, MHA, CDE Diabetes Coordinator Inpatient Diabetes Program  [phone] (Team Pager) [phone] ([place]) 02/27/2016 8:56 AM

## 2016-02-27 NOTE — Progress Notes (Addendum)
CRITICAL VALUE ALERT  Critical value received:  K+ 6.6  Date of notification: 02/27/16  Time of notification:  0915 Critical value read back:yes Nurse who received alert: Lennart Pall MD notified (1st page):  Dr. [name]  Time of first page: 0915 MD notified (2nd page):Dr. Posey Pronto Time of second page:0930  Text page at [phone]   Responding MD:    Time MD responded:    Potassium was  4.1 at midnight Asked lab to repeat the lab. They said the last draw was a difficult stick.

## 2016-02-27 NOTE — Progress Notes (Signed)
Pharmacist - Prescriber Communication  Enoxaparin 40 mg subcutaneously once daily has been changed to twice daily for BMI > 40.  Dustin Bumbaugh A. [place], [place].D., BCPS Clinical Pharmacist 02/27/2016 0028

## 2016-02-27 NOTE — Progress Notes (Signed)
[place] [facility] [place] NAME: Brian [name]    MR#:  [id number]  DATE OF BIRTH:  1961/02/09  SUBJECTIVE:   Came in with progressive increasing SOB and found to have CHF Received high dose of lasix 60 mg bid and uop >4 liters REVIEW OF SYSTEMS:   Review of Systems  Constitutional: Negative for chills, fever and weight loss.  HENT: Negative for ear discharge, ear pain and nosebleeds.   Eyes: Negative for blurred vision, pain and discharge.  Respiratory: Positive for shortness of breath. Negative for sputum production, wheezing and stridor.   Cardiovascular: Positive for orthopnea and leg swelling. Negative for chest pain, palpitations and PND.  Gastrointestinal: Negative for abdominal pain, diarrhea, nausea and vomiting.  Genitourinary: Negative for frequency and urgency.  Musculoskeletal: Negative for back pain and joint pain.  Neurological: Positive for weakness. Negative for sensory change, speech change and focal weakness.  Psychiatric/Behavioral: Negative for depression and hallucinations. The patient is not nervous/anxious.    Tolerating Diet:yes Tolerating PT: pending  DRUG ALLERGIES:   Allergies  Allergen Reactions  . Naproxen Rash    VITALS:  Blood pressure 128/71, pulse (!) 37, temperature 98.6 F (37 C), temperature source Oral, resp. rate 20, height 5\' 10"  (1.778 m), weight (!) 146.7 kg (323 lb 6.4 oz), SpO2 (!) 75 %.  PHYSICAL EXAMINATION:   Physical Exam  GENERAL:  56 y.o.-year-old patient lying in the bed with no acute distress. obese EYES: Pupils equal, round, reactive to light and accommodation. No scleral icterus. Extraocular muscles intact.  HEENT: Head atraumatic, normocephalic. Oropharynx and nasopharynx clear.  NECK:  Supple, no jugular venous distention. No thyroid enlargement, no tenderness.  LUNGS: distant  breath sounds bilaterally, no wheezing, rales, rhonchi. No use of accessory muscles of  respiration.  CARDIOVASCULAR: S1, S2 normal. No murmurs, rubs, or gallops.  ABDOMEN: Soft, nontender, nondistended. Bowel sounds present. No organomegaly or mass.  EXTREMITIES: No cyanosis, clubbing or edema b/l.    NEUROLOGIC: Cranial nerves II through XII are intact. No focal Motor or sensory deficits b/l.   PSYCHIATRIC:  patient is alert and oriented x 3.  SKIN: No obvious rash, lesion, or ulcer.   LABORATORY PANEL:  CBC  Recent Labs Lab 02/27/16 0729  WBC 16.3*  HGB 11.0*  HCT 33.6*  PLT 307    Chemistries   Recent Labs Lab 02/26/16 1823  02/27/16 0729  NA 135  < > 134*  K 5.4*  < > 6.6*  CL 107  < > 103  CO2 21*  < > 21*  GLUCOSE 384*  < > 485*  BUN 44*  < > 48*  CREATININE 2.19*  < > 2.29*  CALCIUM 8.5*  < > 8.7*  AST 21  --   --   ALT 21  --   --   ALKPHOS 93  --   --   BILITOT 0.5  --   --   < > = values in this interval not displayed. Cardiac Enzymes  Recent Labs Lab 02/27/16 1251  TROPONINI 0.10*   RADIOLOGY:  Dg Chest Port 1 View  Result Date: 02/26/2016 CLINICAL DATA:  Initial evaluation for acute shortness of breath. EXAM: PORTABLE CHEST 1 VIEW COMPARISON:  Prior radiograph from 02/09/2016. FINDINGS: Cardiomegaly stable from prior. Mediastinal silhouette within normal limits. Lungs normally inflated. Diffuse pulmonary vascular congestion with interstitial prominence, compatible with pulmonary edema. No definite pleural effusion or focal infiltrates. No pneumothorax. No acute osseous abnormality  para IMPRESSION: Cardiomegaly with moderate diffuse pulmonary edema. Electronically Signed   By: Jeannine Boga M.D.   On: 02/26/2016 18:41   ASSESSMENT AND PLAN:  56 y.o. male with a history of COPD, CK D diabetes, hypertension, hyperlipidemia, , tobacco use disorder, recent admission to ICU for sepsis secondary to pneumonia and COPD exacerbation  now being admitted with:  1. Acute respiratory failure with hypoxia secondary to new onset of congestive  heart failure - Telemetry monitoring. -Echo done 02/09/16 -Continue  lisinopril, added beta blocker - Diuresis, intake/output, daily weight. - mild elevated troponins due to demand ischemia - Cardiology consultation pending -Echo pending -d/c  Heparin gtt. No acute chest pains -Dietitian to see for education  2. History of COPD - -Given history of COPD we'll also continue Advair, Spiriva   Continuous pulse oximetry and nebulizers as needed  3. Hyperkalemia-repeat BMP in 4 hours and in a.m Treated -?lab error -repeat k in the pm  4. History of diabetes-hold metformin, cover with regular insulin sliding scale coverage and check hemoglobin A1c  5. Chronic kidney disease, stable at baseline  6. Morbid Obesity and possible OSA -pt recommend weight loss, exercise and get sleep study as out pt -overnite pulse oximetry  7. History of hyperlipidemia-continue Lipitor  8. History of depression-continue Zoloft  9. Tobacco use disorder, severe-nicotine patch and cessation advised.  Case discussed with Care Management/Social Worker. Management plans discussed with the patient, family and they are in agreement.  CODE STATUS:full  DVT Prophylaxis: heprain TOTAL TIME TAKING CARE OF THIS PATIENT: 40 minutes.  >50% time spent on counselling and coordination of care pt and extended family  POSSIBLE D/C IN1-2 DAYS, DEPENDING ON CLINICAL CONDITION.  Note: This dictation was prepared with Dragon dictation along with smaller phrase technology. Any transcriptional errors that result from this process are unintentional.  Lunden Mcleish M.D on 02/27/2016 at 5:39 PM  Between 7am to 6pm - Pager - [phone]  After 6pm go to [email or web address] - password [email or web address] [place] [facility]  Office  [phone]  CC: Primary care physician; Donnie Coffin, MD

## 2016-02-27 NOTE — Consult Note (Signed)
[facility] Cardiology  CARDIOLOGY CONSULT NOTE  Patient ID: Brian [name] MRN: [id number] DOB/AGE: 07/23/1960 56 y.o.  Admit date: 02/26/2016 Referring Physician Posey Pronto Primary Physician [facility] Primary Cardiologist none on file Reason for Consultation Acute congestive heart failure  HPI: 56 year old year male referred for acute congestive heart failure. Patient has a history of COPD, tobacco use, CKD, type II diabetes, hypertension, hyperlipidemia, and recent admission on 02/09/2016 for severe sepsis and acute respiratory failure secondary to COPD and pneumonia. Echo at that time revealed normal left ventricular function with an LVEF of 55-60%. Patient states that for the past 1-2 months, even prior to past admission, he had been developing dyspnea on exertion, worsening chronic lower extremity edema, orthopnea and paroxysmal nocturnal dyspnea. Patient presented to the [facility] ER on 02/26/2015 in acute respiratory distress, treated with BiPAP, IV enalapril, lasix and nitropaste. Admission labs notable for troponin 0.06 and BNP 450. ECG showed sinus tachycardia at a rate of 117 bpm. Chest xray consistent with moderate pulmonary edema. Presently, the patient denies chest pain, palpitations, or shortness of breath. He states that he feels better.   Review of systems complete and found to be negative unless listed above     Past Medical History:  Diagnosis Date  . COPD (chronic obstructive pulmonary disease) ([place])   . Diabetes mellitus type 2 in obese ([place])   . DM (diabetes mellitus) type II controlled with renal manifestation ([place])   . Hypercholesteremia   . Hypertension   . Morbid obesity with BMI of 45.0-49.9, adult ([place])   . Neuropathy ([place])   . Pancreatitis, acute   . Pneumonia     Past Surgical History:  Procedure Laterality Date  . AMPUTATION    . CHOLECYSTECTOMY  1998  . LAPAROSCOPIC APPENDECTOMY N/A 01/06/2015   Procedure: APPENDECTOMY LAPAROSCOPIC drainage of peritoneal abcess;  Surgeon:  Sherri Rad, MD;  Location: [facility] ORS;  Service: General;  Laterality: N/A;    Prescriptions Prior to Admission  Medication Sig Dispense Refill Last Dose  . atorvastatin (LIPITOR) 20 MG tablet Take 20 mg by mouth daily.   02/25/2016 at 2000  . cyclobenzaprine (FLEXERIL) 5 MG tablet Take 5 mg by mouth at bedtime.   02/25/2016 at 2000  . Fluticasone-Salmeterol (ADVAIR DISKUS) 250-50 MCG/DOSE AEPB Inhale 1 puff into the lungs 2 (two) times daily. 60 each 1 02/26/2016 at 0800  . furosemide (LASIX) 20 MG tablet Take 20 mg by mouth daily.   02/25/2016 at 0800  . gabapentin (NEURONTIN) 300 MG capsule Take 600 mg by mouth 3 (three) times daily.   02/25/2016 at 2000  . insulin NPH-regular Human (NOVOLIN 70/30) (70-30) 100 UNIT/ML injection Inject 20 Units into the skin 2 (two) times daily with a meal. 10 mL 2 02/26/2016 at 0800  . lisinopril (PRINIVIL,ZESTRIL) 20 MG tablet Take 20 mg by mouth daily.   02/25/2016 at 0800  . metFORMIN (GLUCOPHAGE) 1000 MG tablet Take 1,000 mg by mouth daily with breakfast.    02/25/2016 at 0800  . sertraline (ZOLOFT) 100 MG tablet Take 100 mg by mouth daily.   02/25/2016 at 0800  . levofloxacin (LEVAQUIN) 500 MG tablet Take 1 tablet (500 mg total) by mouth daily. (Patient not taking: Reported on 02/26/2016) 5 tablet 0 Completed Course at Unknown time  . predniSONE (STERAPRED UNI-PAK 21 TAB) 10 MG (21) TBPK tablet Take 1 tablet (10 mg total) by mouth daily. Take 6 tablets by mouth for 1 day followed by  5 tablets by mouth for 1 day followed  by  4 tablets by mouth for 1 day followed by  3 tablets by mouth for 1 day followed by  2 tablets by mouth for 1 day followed by  1 tablet by mouth for a day and stop (Patient not taking: Reported on 02/26/2016) 21 tablet 0 Completed Course at Unknown time  . tiotropium (SPIRIVA) 18 MCG inhalation capsule Place 1 capsule (18 mcg total) into inhaler and inhale daily. (Patient not taking: Reported on 02/26/2016) 30 capsule 1 Not Taking at Unknown time   Social  History   Social History  . Marital status: Married    Spouse name: N/A  . Number of children: N/A  . Years of education: N/A   Occupational History  . Not on file.   Social History Main Topics  . Smoking status: Current Every Day Smoker    Packs/day: 1.00    Types: Cigarettes  . Smokeless tobacco: Never Used  . Alcohol use No  . Drug use: No  . Sexual activity: Not on file   Other Topics Concern  . Not on file   Social History Narrative  . No narrative on file    Family History  Problem Relation Age of Onset  . Hypertension Mother   . Hyperlipidemia Mother   . Heart disease Father   . Heart disease Maternal Grandfather       Review of systems complete and found to be negative unless listed above      PHYSICAL EXAM  General: Well developed, well nourished, in no acute distress HEENT:  Normocephalic and atramatic Neck:  No JVD.  Lungs: Bibasilar crackles. Normal effort of breathing. No accessory muscle use for respiration. Heart: Tachycardic, regular. Normal S1 and S2 without gallops or murmurs.  Abdomen:  abdomen soft and non-tender  Msk:  Patient sitting upright in chair. Normal tone for age. Extremities: No clubbing, cyanosis. Bilateral pitting lower extremity edema.   Neuro: Alert and oriented X 3. Psych:  Good affect, responds appropriately  Labs:   Lab Results  Component Value Date   WBC 16.3 (H) 02/27/2016   HGB 11.0 (L) 02/27/2016   HCT 33.6 (L) 02/27/2016   MCV 90.3 02/27/2016   PLT 307 02/27/2016    Recent Labs Lab 02/26/16 1823  02/27/16 0729  NA 135  < > 134*  K 5.4*  < > 6.6*  CL 107  < > 103  CO2 21*  < > 21*  BUN 44*  < > 48*  CREATININE 2.19*  < > 2.29*  CALCIUM 8.5*  < > 8.7*  PROT 7.4  --   --   BILITOT 0.5  --   --   ALKPHOS 93  --   --   ALT 21  --   --   AST 21  --   --   GLUCOSE 384*  < > 485*  < > = values in this interval not displayed. Lab Results  Component Value Date   TROPONINI 0.10 ([place]) 02/27/2016    Lab  Results  Component Value Date   CHOL 175 02/27/2016   CHOL 132 08/24/2012   Lab Results  Component Value Date   HDL 60 02/27/2016   HDL 33 (L) 08/24/2012   Lab Results  Component Value Date   LDLCALC 77 02/27/2016   LDLCALC 49 08/24/2012   Lab Results  Component Value Date   TRIG 191 (H) 02/27/2016   TRIG 251 (H) 08/24/2012   Lab Results  Component Value Date  CHOLHDL 2.9 02/27/2016   No results found for: LDLDIRECT    Radiology: Dg Chest Port 1 View  Result Date: 02/26/2016 CLINICAL DATA:  Initial evaluation for acute shortness of breath. EXAM: PORTABLE CHEST 1 VIEW COMPARISON:  Prior radiograph from 02/09/2016. FINDINGS: Cardiomegaly stable from prior. Mediastinal silhouette within normal limits. Lungs normally inflated. Diffuse pulmonary vascular congestion with interstitial prominence, compatible with pulmonary edema. No definite pleural effusion or focal infiltrates. No pneumothorax. No acute osseous abnormality para IMPRESSION: Cardiomegaly with moderate diffuse pulmonary edema. Electronically Signed   By: Jeannine Boga M.D.   On: 02/26/2016 18:41   Dg Chest Port 1 View  Result Date: 02/09/2016 CLINICAL DATA:  Pneumonia . EXAM: PORTABLE CHEST 1 VIEW COMPARISON:  02/07/2016.  Chest x-ray 16 2016 . FINDINGS: Stable cardiomegaly. Partial clearing of bilateral pulmonary infiltrates/in the. No pleural effusion or pneumothorax. IMPRESSION: Partial clearing of bilateral pulmonary infiltrates/edema . Stable cardiomegaly . Electronically Signed   By: Marcello Moores  Register   On: 02/09/2016 07:22   Dg Chest Portable 1 View  Result Date: 02/07/2016 CLINICAL DATA:  Initial evaluation for acute respiratory CED distress. EXAM: PORTABLE CHEST 1 VIEW COMPARISON:  Prior radiograph from 10/08/2014. FINDINGS: Accentuation of the cardiac silhouette related AP technique. Mediastinal silhouette within normal limits. Lungs normally inflated. Dense right lower lobe opacity, compatible with  pneumonia. Underlying bronchitic changes. No pulmonary edema or pleural effusion. No pneumothorax. No acute osseous abnormality. IMPRESSION: 1. Acute right lower lobe pneumonia. 2. Underlying COPD. Electronically Signed   By: Jeannine Boga M.D.   On: 02/07/2016 23:28    EKG: Sinus tachycardia, rate 107 bpm  ASSESSMENT AND PLAN:  1. Acute congestive heart failure 2. Elevated troponin, likely demand-supply ischemia, unlikely ACS in the absence of chest pain. Troponin was elevated at 0.20 during admission 02/07/2016.  Recommendations: 1. Agree with current therapy. 2. Discontinue heparin. 3. Defer cardiac cath at this time. 4. Continue IV lasix 5. Further recommendations pending patient's course.  Signed: Clabe Seal, PA-C 02/27/2016, 5:43 PM

## 2016-02-27 NOTE — Progress Notes (Signed)
Patient weaned off bipap hours ago to nasal cannula at 3 liters.  Patient has now transferred to floor. bipap order discontinued. No new orders for therapy noted at this time.

## 2016-02-27 NOTE — Progress Notes (Signed)
Lab in to collect timed troponin.  Requested CBC and BMP be repeated due to variation with WBC, Glucose.

## 2016-02-27 NOTE — Progress Notes (Signed)
Notified MD of K+ of 5.7. Will continue to monitor

## 2016-02-28 LAB — BASIC METABOLIC PANEL
Anion gap: 10 (ref 5–15)
Anion gap: 8 (ref 5–15)
BUN: 62 mg/dL — AB (ref 6–20)
BUN: 63 mg/dL — ABNORMAL HIGH (ref 6–20)
CHLORIDE: 104 mmol/L (ref 101–111)
CHLORIDE: 103 mmol/L (ref 101–111)
CO2: 18 mmol/L — AB (ref 22–32)
CO2: 21 mmol/L — AB (ref 22–32)
CREATININE: 2.66 mg/dL — AB (ref 0.61–1.24)
Calcium: 7.9 mg/dL — ABNORMAL LOW (ref 8.9–10.3)
Calcium: 8.1 mg/dL — ABNORMAL LOW (ref 8.9–10.3)
Creatinine, Ser: 2.43 mg/dL — ABNORMAL HIGH (ref 0.61–1.24)
GFR calc non Af Amer: 25 mL/min — ABNORMAL LOW (ref >60)
GFR calc non Af Amer: 28 mL/min — ABNORMAL LOW (ref >60)
GFR, EST AFRICAN AMERICAN: 33 mL/min — AB (ref >60)
GFR, EST AFRICAN AMERICAN: 29 mL/min — AB (ref >60)
GLUCOSE: 458 mg/dL — AB (ref 65–99)
GLUCOSE: 479 mg/dL — AB (ref 65–99)
Potassium: 5.2 mmol/L — ABNORMAL HIGH (ref 3.5–5.1)
Potassium: 5.3 mmol/L — ABNORMAL HIGH (ref 3.5–5.1)
Sodium: 132 mmol/L — ABNORMAL LOW (ref 135–145)
Sodium: 132 mmol/L — ABNORMAL LOW (ref 135–145)

## 2016-02-28 LAB — HEMOGLOBIN A1C
HEMOGLOBIN A1C: 11.5 % — AB (ref 4.8–5.6)
Mean Plasma Glucose: 283 mg/dL

## 2016-02-28 LAB — GLUCOSE, CAPILLARY
GLUCOSE-CAPILLARY: 318 mg/dL — AB (ref 65–99)
Glucose-Capillary: 412 mg/dL — ABNORMAL HIGH (ref 65–99)

## 2016-02-28 MED ORDER — FUROSEMIDE 40 MG PO TABS: 40.0000 mg | ORAL_TABLET | Freq: Every day | ORAL | Status: DC

## 2016-02-28 MED ORDER — INSULIN GLARGINE 100 UNIT/ML ~~LOC~~ SOLN: 35.0000 [IU] | Freq: Every day | SUBCUTANEOUS | 11 refills | Status: DC

## 2016-02-28 MED ORDER — FUROSEMIDE 20 MG PO TABS: 20.0000 mg | ORAL_TABLET | Freq: Two times a day (BID) | ORAL | 1 refills | Status: DC

## 2016-02-28 MED ORDER — INSULIN ASPART 100 UNIT/ML IV SOLN: 10.0000 [IU] | Freq: Three times a day (TID) | INTRAVENOUS | 12 refills | Status: DC

## 2016-02-28 MED ORDER — INSULIN ASPART 100 UNIT/ML IV SOLN
10.0000 [IU] | Freq: Three times a day (TID) | INTRAVENOUS | Status: DC
  Filled 2016-02-28 (×2): qty 0.1

## 2016-02-28 MED ORDER — CARVEDILOL 3.125 MG PO TABS: 3.1250 mg | ORAL_TABLET | Freq: Two times a day (BID) | ORAL | 2 refills | Status: DC

## 2016-02-28 MED ORDER — INSULIN GLARGINE 100 UNIT/ML ~~LOC~~ SOLN
35.0000 [IU] | Freq: Every day | SUBCUTANEOUS | Status: DC
  Filled 2016-02-28: qty 0.35

## 2016-02-28 MED ORDER — ASPIRIN 81 MG PO TBEC: 81.0000 mg | DELAYED_RELEASE_TABLET | Freq: Every day | ORAL | 0 refills | Status: AC

## 2016-02-28 NOTE — Progress Notes (Signed)
O2 sats on 3 L [place] was 100%.  On room air 94%.  Oxygen discontinued.

## 2016-02-28 NOTE — Progress Notes (Signed)
Discharged to home with family members.  Reviewed care for copd, chf, and diabetes.

## 2016-02-28 NOTE — Progress Notes (Signed)
Notified MD of cbg of 409. Given 5 units of insulin and 28 units of Lantus. Will continue to monitor and assess

## 2016-02-28 NOTE — Progress Notes (Signed)
Patient has concerns about being d/c'd and what will happen to him if he becomes sob or has anxiety. Encouraged conversation with MD in am. Will continue to monitor

## 2016-02-28 NOTE — Discharge Instructions (Signed)
Pt to d/w PCP regarding out pt sleep study Check your sugars and keep log of it

## 2016-02-28 NOTE — Care Management Note (Signed)
Case Management Note  Patient Details  Name: DAYSEAN TINKHAM MRN: [id number] Date of Birth: May 11, 1960  Subjective/Objective:      Discussed oxygen saturations with Gentry Fitz RN  Who reports that Mr Bunch has had no problems with low oxygen saturations on room air while ambulating in his room today.             Action/Plan:   Expected Discharge Date:  02/28/16               Expected Discharge Plan:     In-House Referral:     Discharge planning Services     Post Acute Care Choice:    Choice offered to:     DME Arranged:    DME Agency:     HH Arranged:    HH Agency:     Status of Service:     If discussed at [employer], dates discussed:    Additional Comments:  Emmalene Kattner A, RN 02/28/2016, 12:59 PM

## 2016-02-28 NOTE — Discharge Summary (Signed)
Brian [name] NAME: Brian [name]    MR#:  [id number]  DATE OF BIRTH:  06-Oct-1960  DATE OF ADMISSION:  02/26/2016 ADMITTING PHYSICIAN: Harvie Bridge, DO  DATE OF DISCHARGE: 02/28/16  PRIMARY CARE PHYSICIAN: Donnie Coffin, MD    ADMISSION DIAGNOSIS:  Acute pulmonary edema (HCC) [J81.0] COPD exacerbation (HCC) [J44.1] Acute respiratory failure with hypoxia (HCC) [J96.01]  DISCHARGE DIAGNOSIS:  Acute hypoxic respiratory failure due to new onset acute diastolic Heart failure COPD stable Uncontrolled Dm-2 CKD-III (pt given info for making appt with nephrology) Morbid obesity  Possible  OSA (will need sleep study) SECONDARY DIAGNOSIS:   Past Medical History:  Diagnosis Date  . COPD (chronic obstructive pulmonary disease) ([place])   . Diabetes mellitus type 2 in obese ([place])   . DM (diabetes mellitus) type II controlled with renal manifestation ([place])   . Hypercholesteremia   . Hypertension   . Morbid obesity with BMI of 45.0-49.9, adult ([place])   . Neuropathy ([place])   . Pancreatitis, acute   . Pneumonia     HOSPITAL COURSE:   56 y.o.malewith a history of COPD, CK D diabetes, hypertension, hyperlipidemia, , tobacco use disorder, recent admission to ICU for sepsis secondary to pneumonia and COPD exacerbationnow being admitted with:  1. Acute respiratory failure with hypoxia secondary to new onset of Diastolic congestive heart failure - Telemetry monitoring. -Echo done 02/09/16 showed EF 55-60% -Continue  beta blocker - Diuresis good -weight down by 8 lbs -sats 94% on RA - mild elevated troponins due to demand ischemia - Cardiology consultation noted -d/c  Heparin gtt. No acute chest pains -Dietitian saw pt for education  2. History of COPD - -Givenhistory of COPD we'll also continue Advair, Spiriva  Sats >92% on RA  3. Hyperkalemia -improved K 5.2 Pt advised to avoid foods with high K  4. History of  diabetes -d/ced  Metformin due to CKD-III - Lantus 35 units qd and aspart 10 units tid cover with regular insulin sliding scale coverage  -hemoglobin A1c 11.0 -dietary education done  5. Chronic kidney disease, stable at baseline -pt will see nephrology as out pt Info given to call and make appt  6. Morbid Obesity and possible OSA -pt recommend weight loss, exercise and get sleep study as out pt  7. History of hyperlipidemia-continue Lipitor  8. History of depression-continue Zoloft  9. Tobacco use disorder, severe-nicotine patch and cessation advised.  Overall better D/c home CONSULTS OBTAINED:  Treatment Team:  Isaias Cowman, MD  DRUG ALLERGIES:   Allergies  Allergen Reactions  . Naproxen Rash    DISCHARGE MEDICATIONS:   Current Discharge Medication List    START taking these medications   Details  aspirin EC 81 MG EC tablet Take 1 tablet (81 mg total) by mouth daily. Qty: 30 tablet, Refills: 0    carvedilol (COREG) 3.125 MG tablet Take 1 tablet (3.125 mg total) by mouth 2 (two) times daily with a meal. Qty: 60 tablet, Refills: 2    insulin aspart (NOVOLOG) 100 unit/mL injection Inject 10 Units into the vein 3 (three) times daily before meals. Qty: 1 vial, Refills: 12    insulin glargine (LANTUS) 100 UNIT/ML injection Inject 0.35 mLs (35 Units total) into the skin at bedtime. Qty: 10 mL, Refills: 11      CONTINUE these medications which have NOT CHANGED   Details  atorvastatin (LIPITOR) 20 MG tablet Take 20 mg by mouth daily.  cyclobenzaprine (FLEXERIL) 5 MG tablet Take 5 mg by mouth at bedtime.    Fluticasone-Salmeterol (ADVAIR DISKUS) 250-50 MCG/DOSE AEPB Inhale 1 puff into the lungs 2 (two) times daily. Qty: 60 each, Refills: 1    furosemide (LASIX) 20 MG tablet Take 20 mg by mouth daily.    gabapentin (NEURONTIN) 300 MG capsule Take 600 mg by mouth 3 (three) times daily.    sertraline (ZOLOFT) 100 MG tablet Take 100 mg by mouth  daily.    tiotropium (SPIRIVA) 18 MCG inhalation capsule Place 1 capsule (18 mcg total) into inhaler and inhale daily. Qty: 30 capsule, Refills: 1      STOP taking these medications     insulin NPH-regular Human (NOVOLIN 70/30) (70-30) 100 UNIT/ML injection      lisinopril (PRINIVIL,ZESTRIL) 20 MG tablet      metFORMIN (GLUCOPHAGE) 1000 MG tablet      levofloxacin (LEVAQUIN) 500 MG tablet      predniSONE (STERAPRED UNI-PAK 21 TAB) 10 MG (21) TBPK tablet         If you experience worsening of your admission symptoms, develop shortness of breath, life threatening emergency, suicidal or homicidal thoughts you must seek medical attention immediately by calling 911 or calling your MD immediately  if symptoms less severe.  You Must read complete instructions/literature along with all the possible adverse reactions/side effects for all the Medicines you take and that have been prescribed to you. Take any new Medicines after you have completely understood and accept all the possible adverse reactions/side effects.   Please note  You were cared for by a hospitalist during your hospital stay. If you have any questions about your discharge medications or the care you received while you were in the hospital after you are discharged, you can call the unit and asked to speak with the hospitalist on call if the hospitalist that took care of you is not available. Once you are discharged, your primary care physician will handle any further medical issues. Please note that NO REFILLS for any discharge medications will be authorized once you are discharged, as it is imperative that you return to your primary care physician (or establish a relationship with a primary care physician if you do not have one) for your aftercare needs so that they can reassess your need for medications and monitor your lab values. Today   SUBJECTIVE   Feels better  VITAL SIGNS:  Blood pressure 134/76, pulse 90, temperature  97.7 F (36.5 C), temperature source Oral, resp. rate 16, height 5\' 10"  (1.778 m), weight (!) 145.3 kg (320 lb 4.8 oz), SpO2 96 %.  I/O:    Intake/Output Summary (Last 24 hours) at 02/28/16 1115 Last data filed at 02/28/16 0900  Gross per 24 hour  Intake              720 ml  Output             1150 ml  Net             -430 ml    PHYSICAL EXAMINATION:  GENERAL:  56 y.o.-year-old patient lying in the bed with no acute distress. obese EYES: Pupils equal, round, reactive to light and accommodation. No scleral icterus. Extraocular muscles intact.  HEENT: Head atraumatic, normocephalic. Oropharynx and nasopharynx clear.  NECK:  Supple, no jugular venous distention. No thyroid enlargement, no tenderness.  LUNGS: Normal breath sounds bilaterally, no wheezing, rales,rhonchi or crepitation. No use of accessory muscles of respiration.  CARDIOVASCULAR: S1,  S2 normal. No murmurs, rubs, or gallops.  ABDOMEN: Soft, non-tender, non-distended. Bowel sounds present. No organomegaly or mass.  EXTREMITIES:+ pedal edema -no cyanosis, or clubbing.  NEUROLOGIC: Cranial nerves II through XII are intact. Muscle strength 5/5 in all extremities. Sensation intact. Gait not checked.  PSYCHIATRIC: The patient is alert and oriented x 3.  SKIN: No obvious rash, lesion, or ulcer.   DATA REVIEW:   CBC   Recent Labs Lab 02/27/16 0729  WBC 16.3*  HGB 11.0*  HCT 33.6*  PLT 307    Chemistries   Recent Labs Lab 02/26/16 1823  02/28/16 0534  NA 135  < > 132*  K 5.4*  < > 5.3*  CL 107  < > 104  CO2 21*  < > 18*  GLUCOSE 384*  < > 458*  BUN 44*  < > 62*  CREATININE 2.19*  < > 2.43*  CALCIUM 8.5*  < > 8.1*  AST 21  --   --   ALT 21  --   --   ALKPHOS 93  --   --   BILITOT 0.5  --   --   < > = values in this interval not displayed.  Microbiology Results   Recent Results (from the past 240 hour(s))  Blood culture (routine x 2)     Status: None (Preliminary result)   Collection Time: 02/26/16   6:21 PM  Result Value Ref Range Status   Specimen Description BLOOD  R SHOULDER  Final   Special Requests   Final    BOTTLES DRAWN AEROBIC AND ANAEROBIC  AER 10 ML ANA 10 ML   Culture NO GROWTH 2 DAYS  Final   Report Status PENDING  Incomplete  Blood culture (routine x 2)     Status: None (Preliminary result)   Collection Time: 02/26/16  6:21 PM  Result Value Ref Range Status   Specimen Description BLOOD LEFT AC  Final   Special Requests   Final    BOTTLES DRAWN AEROBIC AND ANAEROBIC AER 10ML ANA 8ML   Culture NO GROWTH 2 DAYS  Final   Report Status PENDING  Incomplete    RADIOLOGY:  Dg Chest Port 1 View  Result Date: 02/26/2016 CLINICAL DATA:  Initial evaluation for acute shortness of breath. EXAM: PORTABLE CHEST 1 VIEW COMPARISON:  Prior radiograph from 02/09/2016. FINDINGS: Cardiomegaly stable from prior. Mediastinal silhouette within normal limits. Lungs normally inflated. Diffuse pulmonary vascular congestion with interstitial prominence, compatible with pulmonary edema. No definite pleural effusion or focal infiltrates. No pneumothorax. No acute osseous abnormality para IMPRESSION: Cardiomegaly with moderate diffuse pulmonary edema. Electronically Signed   By: Jeannine Boga M.D.   On: 02/26/2016 18:41     Management plans discussed with the patient, family and they are in agreement.  CODE STATUS:     Code Status Orders        Start     Ordered   02/27/16 0024  Full code  Continuous     02/27/16 0023    Code Status History    Date Active Date Inactive Code Status Order ID Comments User Context   02/08/2016 12:28 AM 02/09/2016  6:20 PM Full Code [id number]  Laverle Hobby, MD ED   01/06/2015 12:09 AM 01/08/2015  9:38 PM Full Code [id number]  Hubbard Robinson, MD ED      TOTAL TIME TAKING CARE OF THIS PATIENT: 40 minutes.    Anvi Mangal M.D on 02/28/2016 at 11:15 AM  Between 7am to 6pm -  Pager - [phone] After 6pm go to [email or web address] - password [email or web address]  [facility]  Office  [phone]  CC: Primary care physician; Donnie Coffin, MD

## 2016-03-02 LAB — CULTURE, BLOOD (ROUTINE X 2)
Culture: NO GROWTH
Culture: NO GROWTH

## 2016-03-05 ENCOUNTER — Telehealth: Payer: Self-pay

## 2016-03-05 NOTE — Telephone Encounter (Signed)
-----   Message from Alisa Graff, [place] sent at 03/05/2016  8:53 AM EST ----- Regarding: Please call Contact: [phone] Was just admitted 1/4/ w/HF

## 2016-03-05 NOTE — Telephone Encounter (Signed)
Spoke with Brian [name] in regards to setting up an appointment in the clinic. He is agreeable to making an appointment. Appointment made for 1/24 at 9:30 am

## 2016-03-09 ENCOUNTER — Other Ambulatory Visit: Payer: Self-pay | Admitting: Nephrology

## 2016-03-09 DIAGNOSIS — N183 Chronic kidney disease, stage 3 unspecified: Secondary | ICD-10-CM

## 2016-03-09 DIAGNOSIS — N179 Acute kidney failure, unspecified: Secondary | ICD-10-CM

## 2016-03-09 DIAGNOSIS — R319 Hematuria, unspecified: Secondary | ICD-10-CM

## 2016-03-12 ENCOUNTER — Ambulatory Visit: Payer: Medicaid Other

## 2016-03-17 ENCOUNTER — Ambulatory Visit: Payer: Medicaid Other | Admitting: Family

## 2016-03-23 ENCOUNTER — Inpatient Hospital Stay: Payer: Medicaid Other | Admitting: Internal Medicine

## 2016-03-26 ENCOUNTER — Encounter: Payer: Self-pay | Admitting: Family

## 2016-03-26 ENCOUNTER — Ambulatory Visit: Payer: Medicaid Other | Attending: Family | Admitting: Family

## 2016-03-26 VITALS — BP 153/79 | HR 98 | Resp 18 | Ht 70.0 in | Wt 315.1 lb

## 2016-03-26 DIAGNOSIS — I130 Hypertensive heart and chronic kidney disease with heart failure and stage 1 through stage 4 chronic kidney disease, or unspecified chronic kidney disease: Secondary | ICD-10-CM | POA: Insufficient documentation

## 2016-03-26 DIAGNOSIS — E1122 Type 2 diabetes mellitus with diabetic chronic kidney disease: Secondary | ICD-10-CM | POA: Diagnosis not present

## 2016-03-26 DIAGNOSIS — Z79899 Other long term (current) drug therapy: Secondary | ICD-10-CM | POA: Diagnosis not present

## 2016-03-26 DIAGNOSIS — Z794 Long term (current) use of insulin: Secondary | ICD-10-CM | POA: Insufficient documentation

## 2016-03-26 DIAGNOSIS — R0683 Snoring: Secondary | ICD-10-CM | POA: Diagnosis not present

## 2016-03-26 DIAGNOSIS — K859 Acute pancreatitis without necrosis or infection, unspecified: Secondary | ICD-10-CM | POA: Insufficient documentation

## 2016-03-26 DIAGNOSIS — Z5189 Encounter for other specified aftercare: Secondary | ICD-10-CM | POA: Diagnosis present

## 2016-03-26 DIAGNOSIS — Z9889 Other specified postprocedural states: Secondary | ICD-10-CM | POA: Insufficient documentation

## 2016-03-26 DIAGNOSIS — F17210 Nicotine dependence, cigarettes, uncomplicated: Secondary | ICD-10-CM | POA: Insufficient documentation

## 2016-03-26 DIAGNOSIS — Z7982 Long term (current) use of aspirin: Secondary | ICD-10-CM | POA: Insufficient documentation

## 2016-03-26 DIAGNOSIS — I5032 Chronic diastolic (congestive) heart failure: Secondary | ICD-10-CM | POA: Insufficient documentation

## 2016-03-26 DIAGNOSIS — I10 Essential (primary) hypertension: Secondary | ICD-10-CM

## 2016-03-26 DIAGNOSIS — Z720 Tobacco use: Secondary | ICD-10-CM

## 2016-03-26 DIAGNOSIS — N182 Chronic kidney disease, stage 2 (mild): Secondary | ICD-10-CM

## 2016-03-26 DIAGNOSIS — Z9049 Acquired absence of other specified parts of digestive tract: Secondary | ICD-10-CM | POA: Insufficient documentation

## 2016-03-26 NOTE — Patient Instructions (Addendum)
Continue weighing daily and call for an overnight weight gain of > 2 pounds or a weekly weight gain of >5 pounds.   Follow up with:  Dr. Peggye Form   March 31, 2016 @ 2:15pm  Dr. Lorriane Shire  March 31, 2016 @ 4:00 pm        Smoking Cessation Quitting smoking is important to your health and has many advantages. However, it is not always easy to quit since nicotine is a very addictive drug. Oftentimes, people try 3 times or more before being able to quit. This document explains the best ways for you to prepare to quit smoking. Quitting takes hard work and a lot of effort, but you can do it. ADVANTAGES OF QUITTING SMOKING  You will live longer, feel better, and live better.  Your body will feel the impact of quitting smoking almost immediately.  Within 20 minutes, blood pressure decreases. Your pulse returns to its normal level.  After 8 hours, carbon monoxide levels in the blood return to normal. Your oxygen level increases.  After 24 hours, the chance of having a heart attack starts to decrease. Your breath, hair, and body stop smelling like smoke.  After 48 hours, damaged nerve endings begin to recover. Your sense of taste and smell improve.  After 72 hours, the body is virtually free of nicotine. Your bronchial tubes relax and breathing becomes easier.  After 2 to 12 weeks, lungs can hold more air. Exercise becomes easier and circulation improves.  The risk of having a heart attack, stroke, cancer, or lung disease is greatly reduced.  After 1 year, the risk of coronary heart disease is cut in half.  After 5 years, the risk of stroke falls to the same as a nonsmoker.  After 10 years, the risk of lung cancer is cut in half and the risk of other cancers decreases significantly.  After 15 years, the risk of coronary heart disease drops, usually to the level of a nonsmoker.  If you are pregnant, quitting smoking will improve your chances of having a healthy  baby.  The people you live with, especially any children, will be healthier.  You will have extra money to spend on things other than cigarettes. QUESTIONS TO THINK ABOUT BEFORE ATTEMPTING TO QUIT You may want to talk about your answers with your health care provider.  Why do you want to quit?  If you tried to quit in the past, what helped and what did not?  What will be the most difficult situations for you after you quit? How will you plan to handle them?  Who can help you through the tough times? Your family? Friends? A health care provider?  What pleasures do you get from smoking? What ways can you still get pleasure if you quit? Here are some questions to ask your health care provider:  How can you help me to be successful at quitting?  What medicine do you think would be best for me and how should I take it?  What should I do if I need more help?  What is smoking withdrawal like? How can I get information on withdrawal? GET READY  Set a quit date.  Change your environment by getting rid of all cigarettes, ashtrays, matches, and lighters in your home, car, or work. Do not let people smoke in your home.  Review your past attempts to quit. Think about what worked and what did not. GET SUPPORT AND ENCOURAGEMENT You have a better chance of  being successful if you have help. You can get support in many ways.  Tell your family, friends, and coworkers that you are going to quit and need their support. Ask them not to smoke around you.  Get individual, group, or telephone counseling and support. Programs are available at [employer] and health centers. Call your local health department for information about programs in your area.  Spiritual beliefs and practices may help some smokers quit.  Download a "quit meter" on your computer to keep track of quit statistics, such as how long you have gone without smoking, cigarettes not smoked, and money saved.  Get a self-help book  about quitting smoking and staying off tobacco. [employer] yourself from urges to smoke. Talk to someone, go for a walk, or occupy your time with a task.  Change your normal routine. Take a different route to work. Drink tea instead of coffee. Eat breakfast in a different place.  Reduce your stress. Take a hot bath, exercise, or read a book.  Plan something enjoyable to do every day. Reward yourself for not smoking.  Explore interactive web-based programs that specialize in helping you quit. GET MEDICINE AND USE IT CORRECTLY Medicines can help you stop smoking and decrease the urge to smoke. Combining medicine with the above behavioral methods and support can greatly increase your chances of successfully quitting smoking.  Nicotine replacement therapy helps deliver nicotine to your body without the negative effects and risks of smoking. Nicotine replacement therapy includes nicotine gum, lozenges, inhalers, nasal sprays, and skin patches. Some may be available over-the-counter and others require a prescription.  Antidepressant medicine helps people abstain from smoking, but how this works is unknown. This medicine is available by prescription.  Nicotinic receptor partial agonist medicine simulates the effect of nicotine in your brain. This medicine is available by prescription. Ask your health care provider for advice about which medicines to use and how to use them based on your health history. Your health care provider will tell you what side effects to look out for if you choose to be on a medicine or therapy. Carefully read the information on the package. Do not use any other product containing nicotine while using a nicotine replacement product.  RELAPSE OR DIFFICULT SITUATIONS Most relapses occur within the first 3 months after quitting. Do not be discouraged if you start smoking again. Remember, most people try several times before finally quitting. You may  have symptoms of withdrawal because your body is used to nicotine. You may crave cigarettes, be irritable, feel very hungry, cough often, get headaches, or have difficulty concentrating. The withdrawal symptoms are only temporary. They are strongest when you first quit, but they will go away within 10-14 days. To reduce the chances of relapse, try to:  Avoid drinking alcohol. Drinking lowers your chances of successfully quitting.  Reduce the amount of caffeine you consume. Once you quit smoking, the amount of caffeine in your body increases and can give you symptoms, such as a rapid heartbeat, sweating, and anxiety.  Avoid smokers because they can make you want to smoke.  Do not let weight gain distract you. Many smokers will gain weight when they quit, usually less than 10 pounds. Eat a healthy diet and stay active. You can always lose the weight gained after you quit.  Find ways to improve your mood other than smoking. FOR MORE INFORMATION  www.smokefree.gov  Document Released: 02/02/2001 Document Revised: 06/25/2013 Document Reviewed: 05/20/2011 ExitCare  Patient Information 2015 [employer]. This information is not intended to replace advice given to you by your health care provider. Make sure you discuss any questions you have with your health care provider.  

## 2016-03-26 NOTE — Progress Notes (Signed)
Patient ID: Brian [name], male    DOB: Oct 19, 1960, 56 y.o.   MRN: [id number]  HPI  Brian [name] is a 56 y/o male with a history of pancreatitis, pneumonia, HTN, hyperlipidemia, DM, COPD, CKD, current tobacco use and chronic heart failure.   Last echo was done 02/09/16 with an EF of 55-60% without valvular regurgitation.   Admitted 02/26/16 with respiratory failure due to new onset heart failure. Was diuresed and lost 8 pounds while admitted. Mild troponin elevated thought to be due to demand ischemia. Cardiology consult was obtained. Discharged home after 2 days. Previous admission on 02/07/16 was due to COPD exacerbation. Treated with steroids, nebulizer and IV antibiotics. Discharged the next day with oral steroid taper and antibiotics.   He presents today for his initial visit with fatigue and shortness of breath with moderate exertion which are quickly relieved with rest. Also has some continued edema in bilateral lower legs. Weight is stable at home. Does endorse chronic snoring and waking up feeling just as tired as when he went to bed. Has never had a sleep study done.   Past Medical History:  Diagnosis Date  . COPD (chronic obstructive pulmonary disease) ([place])   . Diabetes mellitus type 2 in obese ([place])   . DM (diabetes mellitus) type II controlled with renal manifestation ([place])   . Hypercholesteremia   . Hypertension   . Morbid obesity with BMI of 45.0-49.9, adult ([place])   . Neuropathy ([place])   . Pancreatitis, acute   . Pneumonia    Past Surgical History:  Procedure Laterality Date  . AMPUTATION    . CHOLECYSTECTOMY  1998  . LAPAROSCOPIC APPENDECTOMY N/A 01/06/2015   Procedure: APPENDECTOMY LAPAROSCOPIC drainage of peritoneal abcess;  Surgeon: Sherri Rad, MD;  Location: [facility] ORS;  Service: General;  Laterality: N/A;   Family History  Problem Relation Age of Onset  . Hypertension Mother   . Hyperlipidemia Mother   . Heart disease Father   . Heart disease Maternal Grandfather     Social History  Substance Use Topics  . Smoking status: Current Every Day Smoker    Packs/day: 1.00    Types: Cigarettes  . Smokeless tobacco: Never Used  . Alcohol use No   Allergies  Allergen Reactions  . Naproxen Rash   Prior to Admission medications   Medication Sig Start Date End Date Taking? Authorizing Provider  aspirin EC 81 MG EC tablet Take 1 tablet (81 mg total) by mouth daily. 02/29/16  Yes [name], MD  atorvastatin (LIPITOR) 20 MG tablet Take 40 mg by mouth daily.    Yes Historical Provider, MD  carvedilol (COREG) 3.125 MG tablet Take 1 tablet (3.125 mg total) by mouth 2 (two) times daily with a meal. 02/28/16  Yes [name], MD  cyclobenzaprine (FLEXERIL) 5 MG tablet Take 5 mg by mouth at bedtime.   Yes Historical Provider, MD  Fluticasone-Salmeterol (ADVAIR DISKUS) 250-50 MCG/DOSE AEPB Inhale 1 puff into the lungs 2 (two) times daily. 02/09/16  Yes Nicholes Mango, MD  furosemide (LASIX) 20 MG tablet Take 1 tablet (20 mg total) by mouth 2 (two) times daily. 02/28/16  Yes [name], MD  gabapentin (NEURONTIN) 300 MG capsule Take 600 mg by mouth 3 (three) times daily. 11/13/13  Yes Historical Provider, MD  insulin aspart (NOVOLOG) 100 unit/mL injection Inject 10 Units into the vein 3 (three) times daily before meals. 02/28/16  Yes [name], MD  insulin glargine (LANTUS) 100 UNIT/ML injection Inject 0.35 mLs (35  Units total) into the skin at bedtime. 02/28/16  Yes [name], MD  mometasone-formoterol (DULERA) 100-5 MCG/ACT AERO Inhale 2 puffs into the lungs 2 (two) times daily.   Yes Historical Provider, MD  sertraline (ZOLOFT) 100 MG tablet Take 25 mg by mouth daily.  04/10/13  Yes Historical Provider, MD  Vitamin D, Ergocalciferol, (DRISDOL) 50000 units CAPS capsule Take 50,000 Units by mouth every 7 (seven) days.   Yes Historical Provider, MD   Review of Systems  Constitutional: Positive for fatigue. Negative for appetite change.  HENT: Negative for congestion, postnasal drip  and sore throat.   Eyes: Negative.   Respiratory: Positive for shortness of breath (with long walk, bringing in groceries). Negative for chest tightness.   Cardiovascular: Positive for leg swelling. Negative for chest pain and palpitations.  Gastrointestinal: Negative for abdominal distention and abdominal pain.  Endocrine: Negative.   Genitourinary: Negative.   Musculoskeletal: Positive for arthralgias (knee pain) and back pain.  Skin: Negative.   Allergic/Immunologic: Negative.   Neurological: Negative for dizziness and light-headedness.  Hematological: Negative for adenopathy. Does not bruise/bleed easily.  Psychiatric/Behavioral: Positive for sleep disturbance (+ snoring; wake up feeling tired). Negative for dysphoric mood and suicidal ideas. The patient is not nervous/anxious.    Vitals:   03/26/16 0912  BP: (!) 153/79  Pulse: 98  Resp: 18  SpO2: 98%  Weight: (!) 315 lb 2 oz (142.9 kg)  Height: 5\' 10"  (1.778 m)   Wt Readings from Last 3 Encounters:  03/26/16 (!) 315 lb 2 oz (142.9 kg)  02/28/16 (!) 320 lb 4.8 oz (145.3 kg)  02/09/16 (!) 328 lb 4.2 oz (148.9 kg)   Lab Results  Component Value Date   CREATININE 2.43 (H) 02/28/2016   CREATININE 2.66 (H) 02/27/2016   CREATININE 2.29 (H) 02/27/2016   Physical Exam  Constitutional: He is oriented to person, place, and time. He appears well-developed and well-nourished.  HENT:  Head: Normocephalic and atraumatic.  Eyes: Conjunctivae are normal. Pupils are equal, round, and reactive to light.  Neck: Normal range of motion. Neck supple. No JVD present.  Cardiovascular: Normal rate and regular rhythm.   Pulmonary/Chest: Effort normal. He has no wheezes. He has no rales.  Abdominal: Soft. He exhibits no distension. There is no tenderness.  Musculoskeletal: He exhibits no tenderness. Edema: 1+ pitting edma in bilateral lower leg.  Neurological: He is alert and oriented to person, place, and time.  Skin: Skin is warm and dry.   Psychiatric: He has a normal mood and affect. His behavior is normal. Thought content normal.  Nursing note and vitals reviewed.   Assessment & Plan:  1: Chronic heart failure with preserved ejection fraction- - NYHA class II - mild fluid overload with pedal edema present - already weighing daily. Weight is down 5 pounds from discharge. Instructed to call for an overnight weight gain of >2 pounds or a weekly weight gain of >5 pounds - not adding salt to his food. Reading food labels. Discussed the importance of closely following a 2000mg  sodium diet and a low sodium cookbook was given to him. - discussed pulmonary rehab with him and a brochure was given to him about this - sees cardiologist ([facility]) 03/31/16 - Pharm D reviewed medications with the patient  2: HTN- - BP mildly elevated but he didn't take his medications before coming this morning - sees PCP Brunetta Genera) 03/31/16  3: DM- - currently out of test strips. Last time he checked it it was 185 -  follows with nephrology [facility]) and is scheduled for a renal ultrasound - recently had labs done  4: Tobacco use- - smoking < 1/2 ppd of cigarettes now and is working on quitting completely - discussed complete cessation for 3 minutes with him  5: Snoring- - patient says that he snores and wakes up feeling just as tired as when he went to bed - agreeable to a sleep study so a referral will be made to the sleep lab  Medication bottles were reviewed.  Return here in 1 month or sooner for any questions/problems before then.

## 2016-03-28 DIAGNOSIS — R0683 Snoring: Secondary | ICD-10-CM | POA: Insufficient documentation

## 2016-03-28 DIAGNOSIS — I5032 Chronic diastolic (congestive) heart failure: Secondary | ICD-10-CM | POA: Insufficient documentation

## 2016-03-28 DIAGNOSIS — E875 Hyperkalemia: Secondary | ICD-10-CM | POA: Insufficient documentation

## 2016-03-28 DIAGNOSIS — Z720 Tobacco use: Secondary | ICD-10-CM | POA: Insufficient documentation

## 2016-03-30 ENCOUNTER — Ambulatory Visit
Admission: RE | Admit: 2016-03-30 | Discharge: 2016-03-30 | Disposition: A | Discharge status: Home / Self Care | Payer: Medicaid Other | Source: Ambulatory Visit | Attending: Nephrology | Admitting: Nephrology

## 2016-03-30 DIAGNOSIS — N183 Chronic kidney disease, stage 3 unspecified: Secondary | ICD-10-CM

## 2016-03-30 DIAGNOSIS — R319 Hematuria, unspecified: Secondary | ICD-10-CM | POA: Diagnosis not present

## 2016-03-30 DIAGNOSIS — R93421 Abnormal radiologic findings on diagnostic imaging of right kidney: Secondary | ICD-10-CM | POA: Diagnosis not present

## 2016-03-30 DIAGNOSIS — R93422 Abnormal radiologic findings on diagnostic imaging of left kidney: Secondary | ICD-10-CM | POA: Insufficient documentation

## 2016-03-30 DIAGNOSIS — N179 Acute kidney failure, unspecified: Secondary | ICD-10-CM | POA: Diagnosis not present

## 2016-04-23 ENCOUNTER — Ambulatory Visit: Payer: Medicaid Other | Attending: Family | Admitting: Family

## 2016-04-23 ENCOUNTER — Encounter: Payer: Self-pay | Admitting: Family

## 2016-04-23 VITALS — BP 134/71 | HR 95 | Resp 18 | Ht 70.0 in | Wt 323.4 lb

## 2016-04-23 DIAGNOSIS — Z9889 Other specified postprocedural states: Secondary | ICD-10-CM | POA: Diagnosis not present

## 2016-04-23 DIAGNOSIS — E1122 Type 2 diabetes mellitus with diabetic chronic kidney disease: Secondary | ICD-10-CM | POA: Diagnosis not present

## 2016-04-23 DIAGNOSIS — R0683 Snoring: Secondary | ICD-10-CM | POA: Insufficient documentation

## 2016-04-23 DIAGNOSIS — R5383 Other fatigue: Secondary | ICD-10-CM | POA: Diagnosis not present

## 2016-04-23 DIAGNOSIS — I5032 Chronic diastolic (congestive) heart failure: Secondary | ICD-10-CM | POA: Diagnosis not present

## 2016-04-23 DIAGNOSIS — F17210 Nicotine dependence, cigarettes, uncomplicated: Secondary | ICD-10-CM | POA: Diagnosis not present

## 2016-04-23 DIAGNOSIS — Z9049 Acquired absence of other specified parts of digestive tract: Secondary | ICD-10-CM | POA: Insufficient documentation

## 2016-04-23 DIAGNOSIS — M4800 Spinal stenosis, site unspecified: Secondary | ICD-10-CM | POA: Diagnosis not present

## 2016-04-23 DIAGNOSIS — Z8249 Family history of ischemic heart disease and other diseases of the circulatory system: Secondary | ICD-10-CM | POA: Diagnosis not present

## 2016-04-23 DIAGNOSIS — Z7982 Long term (current) use of aspirin: Secondary | ICD-10-CM | POA: Insufficient documentation

## 2016-04-23 DIAGNOSIS — Z5189 Encounter for other specified aftercare: Secondary | ICD-10-CM | POA: Insufficient documentation

## 2016-04-23 DIAGNOSIS — Z794 Long term (current) use of insulin: Secondary | ICD-10-CM | POA: Insufficient documentation

## 2016-04-23 DIAGNOSIS — Z79899 Other long term (current) drug therapy: Secondary | ICD-10-CM | POA: Insufficient documentation

## 2016-04-23 DIAGNOSIS — I130 Hypertensive heart and chronic kidney disease with heart failure and stage 1 through stage 4 chronic kidney disease, or unspecified chronic kidney disease: Secondary | ICD-10-CM | POA: Diagnosis not present

## 2016-04-23 DIAGNOSIS — K859 Acute pancreatitis without necrosis or infection, unspecified: Secondary | ICD-10-CM | POA: Diagnosis not present

## 2016-04-23 DIAGNOSIS — R531 Weakness: Secondary | ICD-10-CM | POA: Diagnosis not present

## 2016-04-23 DIAGNOSIS — R0602 Shortness of breath: Secondary | ICD-10-CM | POA: Insufficient documentation

## 2016-04-23 DIAGNOSIS — Z720 Tobacco use: Secondary | ICD-10-CM

## 2016-04-23 DIAGNOSIS — I10 Essential (primary) hypertension: Secondary | ICD-10-CM

## 2016-04-23 DIAGNOSIS — N182 Chronic kidney disease, stage 2 (mild): Secondary | ICD-10-CM

## 2016-04-23 MED ORDER — TORSEMIDE 20 MG PO TABS: 20.0000 mg | ORAL_TABLET | Freq: Two times a day (BID) | ORAL | 3 refills | Status: DC

## 2016-04-23 NOTE — Progress Notes (Signed)
Patient ID: Brian [name], male    DOB: [birth year], 56 y.o.   MRN: [id number]  HPI  Brian [name] is a 56 y/o male with a history of pancreatitis, pneumonia, HTN, hyperlipidemia, DM, COPD, CKD, current tobacco use and chronic heart failure.   Last echo was done 02/09/16 with an EF of 55-60% without valvular regurgitation.   Admitted 02/26/16 with respiratory failure due to new onset heart failure. Was diuresed and lost 8 pounds while admitted. Mild troponin elevated thought to be due to demand ischemia. Cardiology consult was obtained. Discharged home after 2 days. Previous admission on 02/07/16 was due to COPD exacerbation. Treated with steroids, nebulizer and IV antibiotics. Discharged the next day with oral steroid taper and antibiotics.   He presents today for his follow-up visit with fatigue and shortness of breath with moderate exertion which are quickly relieved with rest. Also has some continued edema in bilateral lower legs. Weight has ranged from 307-309 pounds at home.  Does endorse chronic snoring and waking up feeling just as tired as when he went to bed. Has not heard from the sleep lab yet. Admits to not being too active due to pain due to spinal stenosis and weakness/pain in both of his legs.  Past Medical History:  Diagnosis Date  . COPD (chronic obstructive pulmonary disease) ([place])   . Diabetes mellitus type 2 in obese ([place])   . DM (diabetes mellitus) type II controlled with renal manifestation ([place])   . Hypercholesteremia   . Hypertension   . Morbid obesity with BMI of 45.0-49.9, adult ([place])   . Neuropathy ([place])   . Pancreatitis, acute   . Pneumonia   . Spinal stenosis    Past Surgical History:  Procedure Laterality Date  . AMPUTATION    . CHOLECYSTECTOMY  1998  . LAPAROSCOPIC APPENDECTOMY N/A 01/06/2015   Procedure: APPENDECTOMY LAPAROSCOPIC drainage of peritoneal abcess;  Surgeon: Sherri Rad, MD;  Location: [facility] ORS;  Service: General;  Laterality: N/A;   Family  History  Problem Relation Age of Onset  . Hypertension Mother   . Hyperlipidemia Mother   . Heart disease Father   . Heart disease Maternal Grandfather    Social History  Substance Use Topics  . Smoking status: Current Every Day Smoker    Packs/day: 1.00    Types: Cigarettes  . Smokeless tobacco: Never Used  . Alcohol use No   Allergies  Allergen Reactions  . Naproxen Rash    Prior to Admission medications   Medication Sig Start Date End Date Taking? Authorizing Provider  aspirin EC 81 MG EC tablet Take 1 tablet (81 mg total) by mouth daily. 02/29/16  Yes [name], MD  atorvastatin (LIPITOR) 20 MG tablet Take 40 mg by mouth daily.    Yes Historical Provider, MD  carvedilol (COREG) 3.125 MG tablet Take 1 tablet (3.125 mg total) by mouth 2 (two) times daily with a meal. 02/28/16  Yes [name], MD  cyclobenzaprine (FLEXERIL) 5 MG tablet Take 5 mg by mouth at bedtime.   Yes Historical Provider, MD  Fluticasone-Salmeterol (ADVAIR DISKUS) 250-50 MCG/DOSE AEPB Inhale 1 puff into the lungs 2 (two) times daily. 02/09/16  Yes Nicholes Mango, MD  furosemide (LASIX) 20 MG tablet Take 1 tablet (20 mg total) by mouth 2 (two) times daily. 02/28/16  Yes [name], MD  gabapentin (NEURONTIN) 300 MG capsule Take 600 mg by mouth 3 (three) times daily. 11/13/13  Yes Historical Provider, MD  insulin aspart (NOVOLOG) 100 unit/mL  injection Inject 10 Units into the vein 3 (three) times daily before meals. 02/28/16  Yes [name], MD  insulin glargine (LANTUS) 100 UNIT/ML injection Inject 0.35 mLs (35 Units total) into the skin at bedtime. 02/28/16  Yes [name], MD  mometasone-formoterol (DULERA) 100-5 MCG/ACT AERO Inhale 2 puffs into the lungs 2 (two) times daily.   Yes Historical Provider, MD  sertraline (ZOLOFT) 100 MG tablet Take 25 mg by mouth daily.  04/10/13  Yes Historical Provider, MD  Vitamin D, Ergocalciferol, (DRISDOL) 50000 units CAPS capsule Take 50,000 Units by mouth every 7 (seven) days.   Yes  Historical Provider, MD     Review of Systems  Constitutional: Positive for fatigue. Negative for appetite change.  HENT: Positive for congestion. Negative for postnasal drip and sore throat.   Eyes: Negative.   Respiratory: Positive for cough (intermittent) and shortness of breath. Negative for chest tightness.   Cardiovascular: Positive for leg swelling. Negative for chest pain and palpitations.  Gastrointestinal: Positive for abdominal distention. Negative for abdominal pain.  Endocrine: Negative.   Genitourinary: Negative.   Musculoskeletal: Positive for arthralgias (back of both legs) and back pain (lower back).  Skin: Negative.   Allergic/Immunologic: Negative.   Neurological: Positive for weakness (in legs at times). Negative for dizziness and light-headedness.  Hematological: Negative for adenopathy. Does not bruise/bleed easily.  Psychiatric/Behavioral: Negative for dysphoric mood, sleep disturbance (oxygen at 2L at night only) and suicidal ideas. The patient is not nervous/anxious.    Vitals:   04/23/16 0945  BP: 134/71  Pulse: 95  Resp: 18  SpO2: 97%  Weight: (!) 323 lb 6 oz (146.7 kg)  Height: 5\' 10"  (1.778 m)   Wt Readings from Last 3 Encounters:  04/23/16 (!) 323 lb 6 oz (146.7 kg)  03/26/16 (!) 315 lb 2 oz (142.9 kg)  02/28/16 (!) 320 lb 4.8 oz (145.3 kg)   Lab Results  Component Value Date   CREATININE 2.43 (H) 02/28/2016   CREATININE 2.66 (H) 02/27/2016   CREATININE 2.29 (H) 02/27/2016   Physical Exam  Constitutional: He is oriented to person, place, and time. He appears well-developed and well-nourished.  HENT:  Head: Normocephalic and atraumatic.  Eyes: Conjunctivae are normal. Pupils are equal, round, and reactive to light.  Neck: Normal range of motion. Neck supple. No JVD present.  Cardiovascular: Normal rate and regular rhythm.   Pulmonary/Chest: Effort normal. He has no wheezes. He has no rales.  Abdominal: Soft. He exhibits distension. There is  no tenderness.  Musculoskeletal: He exhibits edema (1+ pitting edema in bilateral lower legs). He exhibits no tenderness.  Neurological: He is alert and oriented to person, place, and time.  Skin: Skin is warm and dry.  Psychiatric: He has a normal mood and affect. His behavior is normal. Thought content normal.  Nursing note and vitals reviewed.    Assessment & Plan:  1: Chronic heart failure with preserved ejection fraction- - NYHA class II - mild fluid overload with pedal edema present - already weighing daily. Weight is up 8 pounds from his last visit here. Instructed to call for an overnight weight gain of >2 pounds or a weekly weight gain of >5 pounds - not adding salt to his food. Reading food labels.  - unable to do pulmonary rehab right now due to pain in his back related to his spinal stenosis - saw cardiologist (Rafael Gonzalez) 03/31/16 and returns 07/01/16 - will change his furosemide to torsemide 20mg  twice daily  2: HTN- -  BP looks good today - saw PCP Brunetta Genera) 03/31/16  3: DM- - glucose ranging from 180-190's - follows with nephrology [facility]) and has an appointment scheduled 04/28/16 - recently had labs done  4: Tobacco use- - smoking < 1/2 ppd of cigarettes now and is working on quitting completely - discussed complete cessation for 3 minutes with him  5: Snoring- - patient says that he snores and wakes up feeling just as tired as when he went to bed - has not heard from the sleep lab. When they were called, they realized they had an incorrect digit in his telephone number. Correct number given to them and they are going to call the patient.  - wearing oxygen at 2L at bedtime  Patient did not bring his medications nor a list. Each medication was verbally reviewed with the patient and he was encouraged to bring the bottles to every visit to confirm accuracy of list.  Return in 1 month or sooner for any questions/problems before then.

## 2016-04-23 NOTE — Patient Instructions (Signed)
Continue weighing daily and call for an overnight weight gain of > 2 pounds or a weekly weight gain of >5 pounds.    Smoking Cessation Quitting smoking is important to your health and has many advantages. However, it is not always easy to quit since nicotine is a very addictive drug. Oftentimes, people try 3 times or more before being able to quit. This document explains the best ways for you to prepare to quit smoking. Quitting takes hard work and a lot of effort, but you can do it. ADVANTAGES OF QUITTING SMOKING  You will live longer, feel better, and live better.  Your body will feel the impact of quitting smoking almost immediately.  Within 20 minutes, blood pressure decreases. Your pulse returns to its normal level.  After 8 hours, carbon monoxide levels in the blood return to normal. Your oxygen level increases.  After 24 hours, the chance of having a heart attack starts to decrease. Your breath, hair, and body stop smelling like smoke.  After 48 hours, damaged nerve endings begin to recover. Your sense of taste and smell improve.  After 72 hours, the body is virtually free of nicotine. Your bronchial tubes relax and breathing becomes easier.  After 2 to 12 weeks, lungs can hold more air. Exercise becomes easier and circulation improves.  The risk of having a heart attack, stroke, cancer, or lung disease is greatly reduced.  After 1 year, the risk of coronary heart disease is cut in half.  After 5 years, the risk of stroke falls to the same as a nonsmoker.  After 10 years, the risk of lung cancer is cut in half and the risk of other cancers decreases significantly.  After 15 years, the risk of coronary heart disease drops, usually to the level of a nonsmoker.  If you are pregnant, quitting smoking will improve your chances of having a healthy baby.  The people you live with, especially any children, will be healthier.  You will have extra money to spend on things other  than cigarettes. QUESTIONS TO THINK ABOUT BEFORE ATTEMPTING TO QUIT You may want to talk about your answers with your health care provider.  Why do you want to quit?  If you tried to quit in the past, what helped and what did not?  What will be the most difficult situations for you after you quit? How will you plan to handle them?  Who can help you through the tough times? Your family? Friends? A health care provider?  What pleasures do you get from smoking? What ways can you still get pleasure if you quit? Here are some questions to ask your health care provider:  How can you help me to be successful at quitting?  What medicine do you think would be best for me and how should I take it?  What should I do if I need more help?  What is smoking withdrawal like? How can I get information on withdrawal? GET READY  Set a quit date.  Change your environment by getting rid of all cigarettes, ashtrays, matches, and lighters in your home, car, or work. Do not let people smoke in your home.  Review your past attempts to quit. Think about what worked and what did not. GET SUPPORT AND ENCOURAGEMENT You have a better chance of being successful if you have help. You can get support in many ways.  Tell your family, friends, and coworkers that you are going to quit and need their support. Ask   them not to smoke around you.  Get individual, group, or telephone counseling and support. Programs are available at local hospitals and health centers. Call your local health department for information about programs in your area.  Spiritual beliefs and practices may help some smokers quit.  Download a "quit meter" on your computer to keep track of quit statistics, such as how long you have gone without smoking, cigarettes not smoked, and money saved.  Get a self-help book about quitting smoking and staying off tobacco. LEARN NEW SKILLS AND BEHAVIORS  Distract yourself from urges to smoke. Talk to  someone, go for a walk, or occupy your time with a task.  Change your normal routine. Take a different route to work. Drink tea instead of coffee. Eat breakfast in a different place.  Reduce your stress. Take a hot bath, exercise, or read a book.  Plan something enjoyable to do every day. Reward yourself for not smoking.  Explore interactive web-based programs that specialize in helping you quit. GET MEDICINE AND USE IT CORRECTLY Medicines can help you stop smoking and decrease the urge to smoke. Combining medicine with the above behavioral methods and support can greatly increase your chances of successfully quitting smoking.  Nicotine replacement therapy helps deliver nicotine to your body without the negative effects and risks of smoking. Nicotine replacement therapy includes nicotine gum, lozenges, inhalers, nasal sprays, and skin patches. Some may be available over-the-counter and others require a prescription.  Antidepressant medicine helps people abstain from smoking, but how this works is unknown. This medicine is available by prescription.  Nicotinic receptor partial agonist medicine simulates the effect of nicotine in your brain. This medicine is available by prescription. Ask your health care provider for advice about which medicines to use and how to use them based on your health history. Your health care provider will tell you what side effects to look out for if you choose to be on a medicine or therapy. Carefully read the information on the package. Do not use any other product containing nicotine while using a nicotine replacement product.  RELAPSE OR DIFFICULT SITUATIONS Most relapses occur within the first 3 months after quitting. Do not be discouraged if you start smoking again. Remember, most people try several times before finally quitting. You may have symptoms of withdrawal because your body is used to nicotine. You may crave cigarettes, be irritable, feel very hungry, cough  often, get headaches, or have difficulty concentrating. The withdrawal symptoms are only temporary. They are strongest when you first quit, but they will go away within 10-14 days. To reduce the chances of relapse, try to:  Avoid drinking alcohol. Drinking lowers your chances of successfully quitting.  Reduce the amount of caffeine you consume. Once you quit smoking, the amount of caffeine in your body increases and can give you symptoms, such as a rapid heartbeat, sweating, and anxiety.  Avoid smokers because they can make you want to smoke.  Do not let weight gain distract you. Many smokers will gain weight when they quit, usually less than 10 pounds. Eat a healthy diet and stay active. You can always lose the weight gained after you quit.  Find ways to improve your mood other than smoking. FOR MORE INFORMATION  www.smokefree.gov  Document Released: 02/02/2001 Document Revised: 06/25/2013 Document Reviewed: 05/20/2011 [employer] Patient Information 2015 [employer]. This information is not intended to replace advice given to you by your health care provider. Make sure you discuss any questions you have with your   health care provider.  

## 2016-05-05 ENCOUNTER — Ambulatory Visit: Payer: Medicaid Other | Attending: Family

## 2016-05-05 DIAGNOSIS — G4710 Hypersomnia, unspecified: Secondary | ICD-10-CM | POA: Diagnosis not present

## 2016-05-05 DIAGNOSIS — E669 Obesity, unspecified: Secondary | ICD-10-CM | POA: Insufficient documentation

## 2016-05-05 DIAGNOSIS — G4761 Periodic limb movement disorder: Secondary | ICD-10-CM | POA: Insufficient documentation

## 2016-05-05 DIAGNOSIS — R0683 Snoring: Secondary | ICD-10-CM | POA: Insufficient documentation

## 2016-05-05 DIAGNOSIS — G4733 Obstructive sleep apnea (adult) (pediatric): Secondary | ICD-10-CM | POA: Diagnosis present

## 2016-05-05 DIAGNOSIS — I10 Essential (primary) hypertension: Secondary | ICD-10-CM | POA: Insufficient documentation

## 2016-05-20 ENCOUNTER — Encounter: Payer: Self-pay | Admitting: Family

## 2016-05-20 ENCOUNTER — Ambulatory Visit: Payer: Medicaid Other | Attending: Family | Admitting: Family

## 2016-05-20 VITALS — BP 130/62 | HR 90 | Resp 18 | Ht 70.0 in | Wt 318.4 lb

## 2016-05-20 DIAGNOSIS — I5032 Chronic diastolic (congestive) heart failure: Secondary | ICD-10-CM

## 2016-05-20 DIAGNOSIS — Z794 Long term (current) use of insulin: Secondary | ICD-10-CM | POA: Insufficient documentation

## 2016-05-20 DIAGNOSIS — Z9889 Other specified postprocedural states: Secondary | ICD-10-CM | POA: Insufficient documentation

## 2016-05-20 DIAGNOSIS — I130 Hypertensive heart and chronic kidney disease with heart failure and stage 1 through stage 4 chronic kidney disease, or unspecified chronic kidney disease: Secondary | ICD-10-CM | POA: Insufficient documentation

## 2016-05-20 DIAGNOSIS — K859 Acute pancreatitis without necrosis or infection, unspecified: Secondary | ICD-10-CM | POA: Diagnosis not present

## 2016-05-20 DIAGNOSIS — Z79899 Other long term (current) drug therapy: Secondary | ICD-10-CM | POA: Insufficient documentation

## 2016-05-20 DIAGNOSIS — Z6841 Body Mass Index (BMI) 40.0 and over, adult: Secondary | ICD-10-CM | POA: Insufficient documentation

## 2016-05-20 DIAGNOSIS — J449 Chronic obstructive pulmonary disease, unspecified: Secondary | ICD-10-CM | POA: Diagnosis not present

## 2016-05-20 DIAGNOSIS — R0683 Snoring: Secondary | ICD-10-CM

## 2016-05-20 DIAGNOSIS — I10 Essential (primary) hypertension: Secondary | ICD-10-CM

## 2016-05-20 DIAGNOSIS — N182 Chronic kidney disease, stage 2 (mild): Secondary | ICD-10-CM

## 2016-05-20 DIAGNOSIS — F17210 Nicotine dependence, cigarettes, uncomplicated: Secondary | ICD-10-CM | POA: Insufficient documentation

## 2016-05-20 DIAGNOSIS — Z9049 Acquired absence of other specified parts of digestive tract: Secondary | ICD-10-CM | POA: Diagnosis not present

## 2016-05-20 DIAGNOSIS — Z7982 Long term (current) use of aspirin: Secondary | ICD-10-CM | POA: Diagnosis not present

## 2016-05-20 DIAGNOSIS — Z5189 Encounter for other specified aftercare: Secondary | ICD-10-CM | POA: Diagnosis present

## 2016-05-20 DIAGNOSIS — E1122 Type 2 diabetes mellitus with diabetic chronic kidney disease: Secondary | ICD-10-CM | POA: Diagnosis not present

## 2016-05-20 DIAGNOSIS — Z720 Tobacco use: Secondary | ICD-10-CM

## 2016-05-20 NOTE — Patient Instructions (Signed)
Continue weighing daily and call for an overnight weight gain of > 2 pounds or a weekly weight gain of >5 pounds. 

## 2016-05-20 NOTE — Progress Notes (Signed)
Patient ID: Brian [name], male    DOB: 22-Mar-1960, 56 y.o.   MRN: [id number]  HPI  Brian [name] is a 56 y/o male with a history of pancreatitis, pneumonia, HTN, hyperlipidemia, DM, COPD, CKD, current tobacco use and chronic heart failure.   Last echo was done 02/09/16 with an EF of 55-60% without valvular regurgitation.   Admitted 02/26/16 with respiratory failure due to new onset heart failure. Was diuresed and lost 8 pounds while admitted. Mild troponin elevated thought to be due to demand ischemia. Cardiology consult was obtained. Discharged home after 2 days. Previous admission on 02/07/16 was due to COPD exacerbation. Treated with steroids, nebulizer and IV antibiotics. Discharged the next day with oral steroid taper and antibiotics.   He presents today for his follow-up visit with fatigue with moderate exertion which is quickly relieved with rest. Also has some continued edema in bilateral lower legs. Denies any shortness of breath or chest pain. Had a fall recently due to the snow and is having some left hip/left posterior thigh pain which he reports as improving. Admits to not being too active due to pain due to spinal stenosis and weakness/pain in both of his legs.  Past Medical History:  Diagnosis Date  . CHF (congestive heart failure) ([place])   . COPD (chronic obstructive pulmonary disease) ([place])   . Diabetes mellitus type 2 in obese ([place])   . DM (diabetes mellitus) type II controlled with renal manifestation ([place])   . Hypercholesteremia   . Hypertension   . Morbid obesity with BMI of 45.0-49.9, adult ([place])   . Neuropathy ([place])   . Pancreatitis, acute   . Pneumonia   . Spinal stenosis    Past Surgical History:  Procedure Laterality Date  . AMPUTATION    . CHOLECYSTECTOMY  1998  . LAPAROSCOPIC APPENDECTOMY N/A 01/06/2015   Procedure: APPENDECTOMY LAPAROSCOPIC drainage of peritoneal abcess;  Surgeon: Sherri Rad, MD;  Location: [facility] ORS;  Service: General;  Laterality: N/A;    Family History  Problem Relation Age of Onset  . Hypertension Mother   . Hyperlipidemia Mother   . Heart disease Father   . Heart disease Maternal Grandfather    Social History  Substance Use Topics  . Smoking status: Current Every Day Smoker    Packs/day: 1.00    Types: Cigarettes  . Smokeless tobacco: Never Used  . Alcohol use No   Allergies  Allergen Reactions  . Naproxen Rash   Prior to Admission medications   Medication Sig Start Date End Date Taking? Authorizing Provider  albuterol (PROVENTIL) (2.5 MG/3ML) 0.083% nebulizer solution Take 2.5 mg by nebulization every 4 (four) hours as needed for wheezing or shortness of breath.   Yes Historical Provider, MD  aspirin EC 81 MG EC tablet Take 1 tablet (81 mg total) by mouth daily. 02/29/16  Yes [name], MD  carvedilol (COREG) 3.125 MG tablet Take 1 tablet (3.125 mg total) by mouth 2 (two) times daily with a meal. 02/28/16  Yes [name], MD  cyclobenzaprine (FLEXERIL) 5 MG tablet Take 5 mg by mouth at bedtime.   Yes Historical Provider, MD  gabapentin (NEURONTIN) 300 MG capsule Take 600 mg by mouth 3 (three) times daily. 11/13/13  Yes Historical Provider, MD  insulin aspart (NOVOLOG) 100 unit/mL injection Inject 10 Units into the vein 3 (three) times daily before meals. 02/28/16  Yes [name], MD  insulin glargine (LANTUS) 100 UNIT/ML injection Inject 0.35 mLs (35 Units total) into the skin at  bedtime. 02/28/16  Yes [name], MD  sertraline (ZOLOFT) 100 MG tablet Take 25 mg by mouth daily.  04/10/13  Yes Historical Provider, MD  torsemide (DEMADEX) 20 MG tablet Take 1 tablet (20 mg total) by mouth 2 (two) times daily. 04/23/16 05/23/16 Yes Alisa Graff, FNP  Vitamin D, Ergocalciferol, (DRISDOL) 50000 units CAPS capsule Take 50,000 Units by mouth every 7 (seven) days.   Yes Historical Provider, MD  Fluticasone-Salmeterol (ADVAIR DISKUS) 250-50 MCG/DOSE AEPB Inhale 1 puff into the lungs 2 (two) times daily. Patient not taking: Reported  on 05/20/2016 02/09/16   Nicholes Mango, MD  mometasone-formoterol (DULERA) 100-5 MCG/ACT AERO Inhale 2 puffs into the lungs 2 (two) times daily.    Historical Provider, MD     Review of Systems  Constitutional: Positive for fatigue. Negative for appetite change.  HENT: Negative for congestion, postnasal drip and sore throat.   Eyes: Negative.   Respiratory: Positive for cough. Negative for chest tightness and shortness of breath.   Cardiovascular: Positive for leg swelling (better since switch to torsemide). Negative for chest pain and palpitations.  Gastrointestinal: Negative for abdominal distention and abdominal pain.  Endocrine: Negative.   Genitourinary: Negative.   Musculoskeletal: Positive for arthralgias (left leg pain) and back pain.  Skin: Negative.   Allergic/Immunologic: Negative.   Neurological: Negative for dizziness and light-headedness.  Hematological: Negative for adenopathy. Bruises/bleeds easily.  Psychiatric/Behavioral: Negative for dysphoric mood, sleep disturbance (wearing oxygen at night) and suicidal ideas. The patient is not nervous/anxious.    Vitals:   05/20/16 0917  BP: 130/62  Pulse: 90  Resp: 18  SpO2: 97%  Weight: (!) 318 lb 6 oz (144.4 kg)  Height: 5\' 10"  (1.778 m)   Wt Readings from Last 3 Encounters:  05/20/16 (!) 318 lb 6 oz (144.4 kg)  04/23/16 (!) 323 lb 6 oz (146.7 kg)  03/26/16 (!) 315 lb 2 oz (142.9 kg)   Lab Results  Component Value Date   CREATININE 2.43 (H) 02/28/2016   CREATININE 2.66 (H) 02/27/2016   CREATININE 2.29 (H) 02/27/2016    Physical Exam  Constitutional: He is oriented to person, place, and time. He appears well-developed and well-nourished.  HENT:  Head: Normocephalic and atraumatic.  Eyes: Conjunctivae are normal. Pupils are equal, round, and reactive to light.  Neck: Normal range of motion. Neck supple. No JVD present.  Cardiovascular: Normal rate and regular rhythm.   Pulmonary/Chest: Effort normal. He has no  wheezes. He has no rales.  Abdominal: Soft. He exhibits no distension. There is no tenderness.  Musculoskeletal: He exhibits edema (trace edema in bilateral lower legs). He exhibits no tenderness.  Neurological: He is alert and oriented to person, place, and time.  Skin: Skin is warm and dry.  Psychiatric: He has a normal mood and affect. His behavior is normal. Thought content normal.  Nursing note and vitals reviewed.    Assessment & Plan:  1: Chronic heart failure with preserved ejection fraction- - NYHA class II - mild fluid overload with pedal edema present - already weighing daily. Weight is down 5 pounds from his last visit here. Instructed to call for an overnight weight gain of >2 pounds or a weekly weight gain of >5 pounds - not adding salt to his food. Reading food labels.  - unable to do pulmonary rehab right now due to pain in his back related to his spinal stenosis - saw cardiologist ([place]) 03/31/16 and returns 07/01/16 - feels like the torsemide is working better  for him than the furosemide was  2: HTN- - BP looks good today - saw PCP Brunetta Genera) 03/31/16 and returns on 05/28/16  3: DM- - glucose ranging from 185-200's - follows with nephrology [facility]) and has an appointment scheduled May 2018 - recently had labs done  4: Tobacco use- - smoking < 1/2 ppd of cigarettes now and is working on quitting completely - discussed complete cessation for 3 minutes with him  5: Snoring- - patient says that he snores and wakes up feeling just as tired as when he went to bed - paperwork has been signed for him to receive his CPAP equipment and he's currently waiting on its arrival. Says that when they put the CPAP on him at the sleep lab, he thought he slept better - wearing oxygen at 2L at bedtime  Patient did not bring his medications nor a list. Each medication was verbally reviewed with the patient and he was encouraged to bring the bottles to every visit to confirm accuracy  of list.  Return here in 3 months or sooner for any questions/problems before then.

## 2016-05-21 ENCOUNTER — Ambulatory Visit: Payer: Medicaid Other | Admitting: Family

## 2016-07-12 ENCOUNTER — Emergency Department
Admission: EM | Admit: 2016-07-12 | Discharge: 2016-07-12 | Disposition: A | Discharge status: Home / Self Care | Payer: Medicaid Other | Attending: Emergency Medicine | Admitting: Emergency Medicine

## 2016-07-12 ENCOUNTER — Emergency Department: Payer: Medicaid Other

## 2016-07-12 ENCOUNTER — Encounter: Payer: Self-pay | Admitting: Emergency Medicine

## 2016-07-12 DIAGNOSIS — Y939 Activity, unspecified: Secondary | ICD-10-CM | POA: Diagnosis not present

## 2016-07-12 DIAGNOSIS — J449 Chronic obstructive pulmonary disease, unspecified: Secondary | ICD-10-CM | POA: Insufficient documentation

## 2016-07-12 DIAGNOSIS — I5032 Chronic diastolic (congestive) heart failure: Secondary | ICD-10-CM | POA: Insufficient documentation

## 2016-07-12 DIAGNOSIS — Y999 Unspecified external cause status: Secondary | ICD-10-CM | POA: Diagnosis not present

## 2016-07-12 DIAGNOSIS — Z79899 Other long term (current) drug therapy: Secondary | ICD-10-CM | POA: Diagnosis not present

## 2016-07-12 DIAGNOSIS — Z794 Long term (current) use of insulin: Secondary | ICD-10-CM | POA: Diagnosis not present

## 2016-07-12 DIAGNOSIS — E119 Type 2 diabetes mellitus without complications: Secondary | ICD-10-CM | POA: Insufficient documentation

## 2016-07-12 DIAGNOSIS — Y9289 Other specified places as the place of occurrence of the external cause: Secondary | ICD-10-CM | POA: Insufficient documentation

## 2016-07-12 DIAGNOSIS — W19XXXA Unspecified fall, initial encounter: Secondary | ICD-10-CM | POA: Diagnosis not present

## 2016-07-12 DIAGNOSIS — M7918 Myalgia, other site: Secondary | ICD-10-CM

## 2016-07-12 DIAGNOSIS — M25552 Pain in left hip: Secondary | ICD-10-CM | POA: Insufficient documentation

## 2016-07-12 DIAGNOSIS — F17210 Nicotine dependence, cigarettes, uncomplicated: Secondary | ICD-10-CM | POA: Diagnosis not present

## 2016-07-12 DIAGNOSIS — I110 Hypertensive heart disease with heart failure: Secondary | ICD-10-CM | POA: Diagnosis not present

## 2016-07-12 DIAGNOSIS — S79912A Unspecified injury of left hip, initial encounter: Secondary | ICD-10-CM | POA: Diagnosis present

## 2016-07-12 MED ORDER — LIDOCAINE 5 % EX PTCH: 1.0000 | MEDICATED_PATCH | Freq: Two times a day (BID) | CUTANEOUS | 0 refills | Status: DC

## 2016-07-12 MED ORDER — LIDOCAINE 5 % EX PTCH
1.0000 | MEDICATED_PATCH | CUTANEOUS | Status: DC
  Administered 2016-07-12: 1 via TRANSDERMAL
  Filled 2016-07-12: qty 1

## 2016-07-12 NOTE — ED Triage Notes (Signed)
Mechanical fall in living room yesterday, pain L hip. Able to ambulate after but with pain.

## 2016-07-12 NOTE — ED Provider Notes (Signed)
[facility] Emergency Department Provider Note  ____________________________________________  Time seen: Approximately 12:01 PM  I have reviewed the triage vital signs and the nursing notes.   HISTORY  Chief Complaint Hip Pain    HPI Brian [name] is a 56 y.o. male that presents to the emergency department with left hip pain after falling yesterday. Patient states that he was walking through his house when his left leg "gave away."He states that this happens frequently from his back pain, diabetic neuropathy, and toe at amputation in 2014. He did not hit head or lose consciousness. He has been walking on his leg with pain. He has a history of back pain and has been to chiropractors previously and does not like the way that they crack his back. He denies fever, shortness breath, chest pain, nausea, vomiting, calf pain.   Past Medical History:  Diagnosis Date  . CHF (congestive heart failure) ([place])   . COPD (chronic obstructive pulmonary disease) ([place])   . Diabetes mellitus type 2 in obese ([place])   . DM (diabetes mellitus) type II controlled with renal manifestation ([place])   . Hypercholesteremia   . Hypertension   . Morbid obesity with BMI of 45.0-49.9, adult ([place])   . Neuropathy   . Pancreatitis, acute   . Pneumonia   . Spinal stenosis     Patient Active Problem List   Diagnosis Date Noted  . Chronic diastolic heart failure ([place]) 03/28/2016  . Tobacco use 03/28/2016  . Snoring 03/28/2016  . Hyperkalemia 03/28/2016  . COPD exacerbation ([place]) 02/08/2016  . DM (diabetes mellitus) type II controlled with renal manifestation ([place]) 01/05/2015  . Neuropathy ([place])   . Hypercholesteremia   . Clinical depression 01/17/2013  . HLD (hyperlipidemia) 01/17/2013  . BP (high blood pressure) 01/17/2013  . Gonalgia 01/17/2013  . Morbid obesity ([place]) 01/17/2013  . Amputated toe ([place]) 01/17/2013  . Diabetes mellitus ([place]) 01/17/2013    Past Surgical History:   Procedure Laterality Date  . AMPUTATION    . CHOLECYSTECTOMY  1998  . LAPAROSCOPIC APPENDECTOMY N/A 01/06/2015   Procedure: APPENDECTOMY LAPAROSCOPIC drainage of peritoneal abcess;  Surgeon: Sherri Rad, MD;  Location: [facility] ORS;  Service: General;  Laterality: N/A;    Prior to Admission medications   Medication Sig Start Date End Date Taking? Authorizing Provider  albuterol (PROVENTIL) (2.5 MG/3ML) 0.083% nebulizer solution Take 2.5 mg by nebulization every 4 (four) hours as needed for wheezing or shortness of breath.    [provider]  aspirin EC 81 MG EC tablet Take 1 tablet (81 mg total) by mouth daily. 02/29/16   [name], MD  carvedilol (COREG) 3.125 MG tablet Take 1 tablet (3.125 mg total) by mouth 2 (two) times daily with a meal. 02/28/16   [name], MD  cyclobenzaprine (FLEXERIL) 5 MG tablet Take 5 mg by mouth at bedtime.    [provider]  Fluticasone-Salmeterol (ADVAIR DISKUS) 250-50 MCG/DOSE AEPB Inhale 1 puff into the lungs 2 (two) times daily. Patient not taking: Reported on 05/20/2016 02/09/16   Nicholes Mango, MD  gabapentin (NEURONTIN) 300 MG capsule Take 600 mg by mouth 3 (three) times daily. 11/13/13   [provider]  insulin aspart (NOVOLOG) 100 unit/mL injection Inject 10 Units into the vein 3 (three) times daily before meals. 02/28/16   [name], MD  insulin glargine (LANTUS) 100 UNIT/ML injection Inject 0.35 mLs (35 Units total) into the skin at bedtime. 02/28/16   [name], MD  lidocaine (Oto)  5 % Place 1 patch onto the skin every 12 (twelve) hours. Remove & Discard patch within 12 hours or as directed by MD 07/12/16 07/12/17  Laban Emperor, PA-C  mometasone-formoterol (DULERA) 100-5 MCG/ACT AERO Inhale 2 puffs into the lungs 2 (two) times daily.    [provider]  sertraline (ZOLOFT) 100 MG tablet Take 25 mg by mouth daily.  04/10/13   [provider]  torsemide (DEMADEX) 20 MG tablet Take 1 tablet (20 mg total) by mouth  2 (two) times daily. 04/23/16 05/23/16  Alisa Graff, FNP  Vitamin D, Ergocalciferol, (DRISDOL) 50000 units CAPS capsule Take 50,000 Units by mouth every 7 (seven) days.    [provider]    Allergies Naproxen  Family History  Problem Relation Age of Onset  . Hypertension Mother   . Hyperlipidemia Mother   . Heart disease Father   . Heart disease Maternal Grandfather     Social History Social History  Substance Use Topics  . Smoking status: Current Every Day Smoker    Packs/day: 1.00    Types: Cigarettes  . Smokeless tobacco: Never Used  . Alcohol use No     Review of Systems  Constitutional: No fever/chills Cardiovascular: No chest pain. Respiratory: No SOB. Gastrointestinal: No abdominal pain.  No nausea, no vomiting.  Musculoskeletal: Positive for left hip pain. Skin: Negative for rash, abrasions, lacerations, ecchymosis.   ____________________________________________   PHYSICAL EXAM:  VITAL SIGNS: ED Triage Vitals [07/12/16 0958]  Enc Vitals Group     BP (!) 141/69     Pulse Rate 74     Resp 20     Temp 98.7 F (37.1 C)     Temp Source Oral     SpO2 98 %     Weight (!) 310 lb (140.6 kg)     Height 5\' 10"  (1.778 m)     Head Circumference      Peak Flow      Pain Score 9     Pain Loc      Pain Edu?      Excl. in [place]?      Constitutional: Alert and oriented. Well appearing and in no acute distress. Eyes: Conjunctivae are normal. EOMI. Head: Atraumatic. ENT:      Ears:      Nose: No congestion/rhinnorhea.      Mouth/Throat: Mucous membranes are moist.  Neck: No stridor.  Cardiovascular: Normal rate, regular rhythm.  Good peripheral circulation. Respiratory: Normal respiratory effort without tachypnea or retractions. Lungs CTAB. Good air entry to the bases with no decreased or absent breath sounds. Musculoskeletal: Full range of motion to all extremities. No gross deformities appreciated. Tenderness to palpation over left hip. Full range  of motion of left hip. No swelling or ecchymosis. No calf pain. Neurologic:  Normal speech and language. No gross focal neurologic deficits are appreciated.  Skin:  Skin is warm, dry and intact. No rash noted.   ____________________________________________   LABS (all labs ordered are listed, but only abnormal results are displayed)  Labs Reviewed - No data to display ____________________________________________  EKG   ____________________________________________  RADIOLOGY Robinette Haines, personally viewed and evaluated these images (plain radiographs) as part of my medical decision making, as well as reviewing the written report by the radiologist.  Dg Hip Unilat W Or Wo Pelvis 2-3 Views Left  Result Date: 07/12/2016 CLINICAL DATA:  Patient's hip gave out and he fell. No outside force caused the fall. EXAM: DG HIP (  WITH OR WITHOUT PELVIS) 2-3V LEFT COMPARISON:  Radiographs 04/23/2014.  Pelvic CT 04/11/2014. FINDINGS: The mineralization and alignment are normal. There is no evidence of acute fracture or dislocation. The hip joint spaces are maintained. There is no evidence of femoral head avascular necrosis. Lower lumbar spondylosis is noted, asymmetric to the left at L4-5. There are vascular calcifications within the pelvis. IMPRESSION: No acute osseous findings.  Lower lumbar spondylosis. Electronically Signed   By: Richardean Sale M.D.   On: 07/12/2016 10:55    ____________________________________________    PROCEDURES  Procedure(s) performed:    Procedures    Medications - No data to display   ____________________________________________   INITIAL IMPRESSION / ASSESSMENT AND PLAN / ED COURSE  Pertinent labs & imaging results that were available during my care of the patient were reviewed by me and considered in my medical decision making (see chart for details).  Review of the [place] CSRS was performed in accordance of the [place] prior to dispensing any controlled  drugs.  Patient's diagnosis is consistent with musculoskeletal pain after fall. Vital signs and exam are reassuring. X-ray negative for acute bony abnormalities. Patient has full range of motion of hip and has been walking on hip since fall. He did not hit head or lose consciousness. Patient will be discharged home with prescriptions for lidoderm patches. Patient is interested in trying physical therapy for history of falls. Patient is to follow up with PCP and physical therapy as directed. Patient is given ED precautions to return to the ED for any worsening or new symptoms.     ____________________________________________  FINAL CLINICAL IMPRESSION(S) / ED DIAGNOSES  Final diagnoses:  Fall, initial encounter  Musculoskeletal pain      NEW MEDICATIONS STARTED DURING THIS VISIT:  Discharge Medication List as of 07/12/2016 12:20 PM    START taking these medications   Details  lidocaine (LIDODERM) 5 % Place 1 patch onto the skin every 12 (twelve) hours. Remove & Discard patch within 12 hours or as directed by MD, Starting Mon 07/12/2016, Until Tue 07/12/2017, Print            This chart was dictated using voice recognition software/Dragon. Despite best efforts to proofread, errors can occur which can change the meaning. Any change was purely unintentional.    Laban Emperor, PA-C 07/12/16 Hipolito Bayley, MD 07/14/16 1101

## 2016-07-12 NOTE — ED Notes (Signed)
Se triage note  States he took a fall yesterday landed on left side  Having pain to left hip area no deformity noted but having increased pain

## 2016-08-20 ENCOUNTER — Ambulatory Visit: Payer: Medicaid Other | Admitting: Family

## 2016-09-13 ENCOUNTER — Ambulatory Visit: Payer: Medicaid Other | Attending: Family | Admitting: Family

## 2016-09-13 ENCOUNTER — Encounter: Payer: Self-pay | Admitting: Family

## 2016-09-13 VITALS — BP 118/61 | HR 73 | Resp 20 | Ht 70.0 in | Wt 332.2 lb

## 2016-09-13 DIAGNOSIS — N189 Chronic kidney disease, unspecified: Secondary | ICD-10-CM | POA: Insufficient documentation

## 2016-09-13 DIAGNOSIS — Z6841 Body Mass Index (BMI) 40.0 and over, adult: Secondary | ICD-10-CM | POA: Insufficient documentation

## 2016-09-13 DIAGNOSIS — M4800 Spinal stenosis, site unspecified: Secondary | ICD-10-CM | POA: Diagnosis not present

## 2016-09-13 DIAGNOSIS — Z7982 Long term (current) use of aspirin: Secondary | ICD-10-CM | POA: Insufficient documentation

## 2016-09-13 DIAGNOSIS — Z794 Long term (current) use of insulin: Secondary | ICD-10-CM | POA: Insufficient documentation

## 2016-09-13 DIAGNOSIS — F17210 Nicotine dependence, cigarettes, uncomplicated: Secondary | ICD-10-CM | POA: Diagnosis not present

## 2016-09-13 DIAGNOSIS — I5032 Chronic diastolic (congestive) heart failure: Secondary | ICD-10-CM

## 2016-09-13 DIAGNOSIS — G4733 Obstructive sleep apnea (adult) (pediatric): Secondary | ICD-10-CM | POA: Diagnosis not present

## 2016-09-13 DIAGNOSIS — E1140 Type 2 diabetes mellitus with diabetic neuropathy, unspecified: Secondary | ICD-10-CM | POA: Diagnosis not present

## 2016-09-13 DIAGNOSIS — J449 Chronic obstructive pulmonary disease, unspecified: Secondary | ICD-10-CM | POA: Insufficient documentation

## 2016-09-13 DIAGNOSIS — I509 Heart failure, unspecified: Secondary | ICD-10-CM | POA: Insufficient documentation

## 2016-09-13 DIAGNOSIS — N182 Chronic kidney disease, stage 2 (mild): Secondary | ICD-10-CM

## 2016-09-13 DIAGNOSIS — E1122 Type 2 diabetes mellitus with diabetic chronic kidney disease: Secondary | ICD-10-CM | POA: Diagnosis not present

## 2016-09-13 DIAGNOSIS — Z720 Tobacco use: Secondary | ICD-10-CM

## 2016-09-13 DIAGNOSIS — E7800 Pure hypercholesterolemia, unspecified: Secondary | ICD-10-CM | POA: Insufficient documentation

## 2016-09-13 DIAGNOSIS — I130 Hypertensive heart and chronic kidney disease with heart failure and stage 1 through stage 4 chronic kidney disease, or unspecified chronic kidney disease: Secondary | ICD-10-CM | POA: Insufficient documentation

## 2016-09-13 DIAGNOSIS — Z9989 Dependence on other enabling machines and devices: Secondary | ICD-10-CM

## 2016-09-13 DIAGNOSIS — E785 Hyperlipidemia, unspecified: Secondary | ICD-10-CM | POA: Insufficient documentation

## 2016-09-13 DIAGNOSIS — I10 Essential (primary) hypertension: Secondary | ICD-10-CM

## 2016-09-13 LAB — GLUCOSE, CAPILLARY: Glucose-Capillary: 234 mg/dL — ABNORMAL HIGH (ref 65–99)

## 2016-09-13 NOTE — Patient Instructions (Addendum)
Continue weighing daily and call for an overnight weight gain of > 2 pounds or a weekly weight gain of >5 pounds.    Smoking Cessation Quitting smoking is important to your health and has many advantages. However, it is not always easy to quit since nicotine is a very addictive drug. Oftentimes, people try 3 times or more before being able to quit. This document explains the best ways for you to prepare to quit smoking. Quitting takes hard work and a lot of effort, but you can do it. ADVANTAGES OF QUITTING SMOKING  You will live longer, feel better, and live better.  Your body will feel the impact of quitting smoking almost immediately.  Within 20 minutes, blood pressure decreases. Your pulse returns to its normal level.  After 8 hours, carbon monoxide levels in the blood return to normal. Your oxygen level increases.  After 24 hours, the chance of having a heart attack starts to decrease. Your breath, hair, and body stop smelling like smoke.  After 48 hours, damaged nerve endings begin to recover. Your sense of taste and smell improve.  After 72 hours, the body is virtually free of nicotine. Your bronchial tubes relax and breathing becomes easier.  After 2 to 12 weeks, lungs can hold more air. Exercise becomes easier and circulation improves.  The risk of having a heart attack, stroke, cancer, or lung disease is greatly reduced.  After 1 year, the risk of coronary heart disease is cut in half.  After 5 years, the risk of stroke falls to the same as a nonsmoker.  After 10 years, the risk of lung cancer is cut in half and the risk of other cancers decreases significantly.  After 15 years, the risk of coronary heart disease drops, usually to the level of a nonsmoker.  If you are pregnant, quitting smoking will improve your chances of having a healthy baby.  The people you live with, especially any children, will be healthier.  You will have extra money to spend on things other  than cigarettes. QUESTIONS TO THINK ABOUT BEFORE ATTEMPTING TO QUIT You may want to talk about your answers with your health care provider.  Why do you want to quit?  If you tried to quit in the past, what helped and what did not?  What will be the most difficult situations for you after you quit? How will you plan to handle them?  Who can help you through the tough times? Your family? Friends? A health care provider?  What pleasures do you get from smoking? What ways can you still get pleasure if you quit? Here are some questions to ask your health care provider:  How can you help me to be successful at quitting?  What medicine do you think would be best for me and how should I take it?  What should I do if I need more help?  What is smoking withdrawal like? How can I get information on withdrawal? GET READY  Set a quit date.  Change your environment by getting rid of all cigarettes, ashtrays, matches, and lighters in your home, car, or work. Do not let people smoke in your home.  Review your past attempts to quit. Think about what worked and what did not. GET SUPPORT AND ENCOURAGEMENT You have a better chance of being successful if you have help. You can get support in many ways.  Tell your family, friends, and coworkers that you are going to quit and need their support. Ask   them not to smoke around you.  Get individual, group, or telephone counseling and support. Programs are available at local hospitals and health centers. Call your local health department for information about programs in your area.  Spiritual beliefs and practices may help some smokers quit.  Download a "quit meter" on your computer to keep track of quit statistics, such as how long you have gone without smoking, cigarettes not smoked, and money saved.  Get a self-help book about quitting smoking and staying off tobacco. LEARN NEW SKILLS AND BEHAVIORS  Distract yourself from urges to smoke. Talk to  someone, go for a walk, or occupy your time with a task.  Change your normal routine. Take a different route to work. Drink tea instead of coffee. Eat breakfast in a different place.  Reduce your stress. Take a hot bath, exercise, or read a book.  Plan something enjoyable to do every day. Reward yourself for not smoking.  Explore interactive web-based programs that specialize in helping you quit. GET MEDICINE AND USE IT CORRECTLY Medicines can help you stop smoking and decrease the urge to smoke. Combining medicine with the above behavioral methods and support can greatly increase your chances of successfully quitting smoking.  Nicotine replacement therapy helps deliver nicotine to your body without the negative effects and risks of smoking. Nicotine replacement therapy includes nicotine gum, lozenges, inhalers, nasal sprays, and skin patches. Some may be available over-the-counter and others require a prescription.  Antidepressant medicine helps people abstain from smoking, but how this works is unknown. This medicine is available by prescription.  Nicotinic receptor partial agonist medicine simulates the effect of nicotine in your brain. This medicine is available by prescription. Ask your health care provider for advice about which medicines to use and how to use them based on your health history. Your health care provider will tell you what side effects to look out for if you choose to be on a medicine or therapy. Carefully read the information on the package. Do not use any other product containing nicotine while using a nicotine replacement product.  RELAPSE OR DIFFICULT SITUATIONS Most relapses occur within the first 3 months after quitting. Do not be discouraged if you start smoking again. Remember, most people try several times before finally quitting. You may have symptoms of withdrawal because your body is used to nicotine. You may crave cigarettes, be irritable, feel very hungry, cough  often, get headaches, or have difficulty concentrating. The withdrawal symptoms are only temporary. They are strongest when you first quit, but they will go away within 10-14 days. To reduce the chances of relapse, try to:  Avoid drinking alcohol. Drinking lowers your chances of successfully quitting.  Reduce the amount of caffeine you consume. Once you quit smoking, the amount of caffeine in your body increases and can give you symptoms, such as a rapid heartbeat, sweating, and anxiety.  Avoid smokers because they can make you want to smoke.  Do not let weight gain distract you. Many smokers will gain weight when they quit, usually less than 10 pounds. Eat a healthy diet and stay active. You can always lose the weight gained after you quit.  Find ways to improve your mood other than smoking. FOR MORE INFORMATION  www.smokefree.gov  Document Released: 02/02/2001 Document Revised: 06/25/2013 Document Reviewed: 05/20/2011 [employer] Patient Information 2015 [employer]. This information is not intended to replace advice given to you by your health care provider. Make sure you discuss any questions you have with your   health care provider.  

## 2016-09-13 NOTE — Progress Notes (Signed)
Patient ID: Brian [name], male    DOB: [birth year], 56 y.o.   MRN: [id number]  HPI  Brian [name] is a 56 y/o male with a history of pancreatitis, pneumonia, HTN, hyperlipidemia, DM, COPD, CKD, current tobacco use and chronic heart failure.   Last echo was done 02/09/16 with an EF of 55-60% without valvular regurgitation.   Was in the ED 07/12/16 after a fall. Evaluated and discharged home. Admitted 02/26/16 with respiratory failure due to new onset heart failure. Was diuresed and lost 8 pounds while admitted. Mild troponin elevated thought to be due to demand ischemia. Cardiology consult was obtained. Discharged home after 2 days. Previous admission on 02/07/16 was due to COPD exacerbation. Treated with steroids, nebulizer and IV antibiotics. Discharged the next day with oral steroid taper and antibiotics.   He presents today for his follow-up visit with a chief complaint of leg edema. He describes this as chronic in nature and has been present for years. He does admit that the swelling is worsening over the last few weeks. He has associated fatigue, shortness of breath and weight gain along with this.   Past Medical History:  Diagnosis Date  . CHF (congestive heart failure) ([place])   . COPD (chronic obstructive pulmonary disease) ([place])   . Diabetes mellitus type 2 in obese ([place])   . DM (diabetes mellitus) type II controlled with renal manifestation ([place])   . Hypercholesteremia   . Hypertension   . Morbid obesity with BMI of 45.0-49.9, adult ([place])   . Neuropathy   . Pancreatitis, acute   . Pneumonia   . Spinal stenosis    Past Surgical History:  Procedure Laterality Date  . AMPUTATION    . CHOLECYSTECTOMY  1998  . LAPAROSCOPIC APPENDECTOMY N/A 01/06/2015   Procedure: APPENDECTOMY LAPAROSCOPIC drainage of peritoneal abcess;  Surgeon: Sherri Rad, MD;  Location: [facility] ORS;  Service: General;  Laterality: N/A;   Family History  Problem Relation Age of Onset  . Hypertension Mother   .  Hyperlipidemia Mother   . Heart disease Father   . Heart disease Maternal Grandfather    Social History  Substance Use Topics  . Smoking status: Current Every Day Smoker    Packs/day: 1.00    Types: Cigarettes  . Smokeless tobacco: Never Used  . Alcohol use No   Allergies  Allergen Reactions  . Naproxen Rash   Prior to Admission medications   Medication Sig Start Date End Date Taking? Authorizing Provider  albuterol (PROVENTIL) (2.5 MG/3ML) 0.083% nebulizer solution Take 2.5 mg by nebulization every 4 (four) hours as needed for wheezing or shortness of breath.   Yes [provider]  aspirin EC 81 MG EC tablet Take 1 tablet (81 mg total) by mouth daily. 02/29/16  Yes [name], MD  carvedilol (COREG) 25 MG tablet Take 25 mg by mouth 2 (two) times daily with a meal.   Yes [provider]  cyclobenzaprine (FLEXERIL) 5 MG tablet Take 5 mg by mouth at bedtime.   Yes [provider]  gabapentin (NEURONTIN) 300 MG capsule Take 600 mg by mouth 3 (three) times daily. 11/13/13  Yes [provider]  insulin aspart (NOVOLOG) 100 unit/mL injection Inject 10 Units into the vein 3 (three) times daily before meals. 02/28/16  Yes [name], MD  insulin glargine (LANTUS) 100 UNIT/ML injection Inject 0.35 mLs (35 Units total) into the skin at bedtime. Patient taking differently: Inject 50 Units into the skin at bedtime. Take 50 units  in the AM and 40 units at bedtime 02/28/16  Yes [name], MD  mometasone-formoterol [facility]) 100-5 MCG/ACT AERO Inhale 2 puffs into the lungs 2 (two) times daily.   Yes [provider]  sertraline (ZOLOFT) 100 MG tablet Take 25 mg by mouth daily.  04/10/13  Yes [provider]  tiotropium (SPIRIVA HANDIHALER) 18 MCG inhalation capsule Place 18 mcg into inhaler and inhale daily.   Yes [provider]  torsemide (DEMADEX) 20 MG tablet Take 1 tablet (20 mg total) by mouth 2 (two) times daily. 04/23/16 09/13/16 Yes  [name], [name], FNP  Vitamin D, Ergocalciferol, (DRISDOL) 50000 units CAPS capsule Take 50,000 Units by mouth every 7 (seven) days.   Yes [provider]    Review of Systems  Constitutional: Positive for fatigue. Negative for appetite change.  HENT: Negative for congestion, postnasal drip and sore throat.   Eyes: Negative.   Respiratory: Positive for shortness of breath. Negative for cough and chest tightness.   Cardiovascular: Positive for leg swelling. Negative for chest pain and palpitations.  Gastrointestinal: Negative for abdominal distention and abdominal pain.  Endocrine: Negative.   Genitourinary: Negative.   Musculoskeletal: Positive for arthralgias (left leg pain) and back pain (chronic).  Skin: Negative.   Allergic/Immunologic: Negative.   Neurological: Positive for headaches (with CPAP). Negative for dizziness and light-headedness.  Hematological: Negative for adenopathy. Bruises/bleeds easily.  Psychiatric/Behavioral: Negative for dysphoric mood, sleep disturbance (wearing CPAP ) and suicidal ideas. The patient is not nervous/anxious.    Vitals:   09/13/16 0856  BP: 118/61  Pulse: 73  Resp: 20  SpO2: 98%  Weight: (!) 332 lb 4 oz (150.7 kg)  Height: 5\' 10"  (1.778 m)   Wt Readings from Last 3 Encounters:  09/13/16 (!) 332 lb 4 oz (150.7 kg)  07/12/16 (!) 310 lb (140.6 kg)  05/20/16 (!) 318 lb 6 oz (144.4 kg)    Lab Results  Component Value Date   CREATININE 2.43 (H) 02/28/2016   CREATININE 2.66 (H) 02/27/2016   CREATININE 2.29 (H) 02/27/2016    Physical Exam  Constitutional: He is oriented to person, place, and time. He appears well-developed and well-nourished.  HENT:  Head: Normocephalic and atraumatic.  Neck: Normal range of motion. Neck supple. No JVD present.  Cardiovascular: Normal rate and regular rhythm.   Pulmonary/Chest: Effort normal. He has no wheezes. He has no rales.  Abdominal: Soft. He exhibits no distension. There is no  tenderness.  Musculoskeletal: He exhibits edema (3+ pitting edema in bilateral lower legs). He exhibits no tenderness.  Neurological: He is alert and oriented to person, place, and time.  Skin: Skin is warm and dry.  Psychiatric: He has a normal mood and affect. His behavior is normal. Thought content normal.  Nursing note and vitals reviewed.    Assessment & Plan:  1: Chronic heart failure with preserved ejection fraction- - NYHA class II - moderate fluid overload with pedal edema present - already weighing daily. Weight is up 13.8 pounds from his last visit here. Instructed to call for an overnight weight gain of >2 pounds or a weekly weight gain of >5 pounds. Says that he's noticed a "2 pound" weight gain "here and there" - currently taking torsemide 20mg  BID so will double his torsemide to 40mg  BID for the next 2-3 days; if that doesn't work, could consider adding metolazone - potassium has traditionally been in the 5's so will not add potassium  - not adding salt to his food.  Reading food labels.  - unable to do pulmonary rehab right now due to pain in his back related to his spinal stenosis - saw cardiologist ([place]) 03/31/16 and has to make an appointment - does not meet ReDs criteria due to BMI - Pharm D went in and reviewed medications with the patient  2: HTN- - BP looks good today - saw PCP Brunetta Genera) 2 weeks ago and returns early August; he is getting lab work drawn at that time  3: DM- - glucose ranging from 110-115's at home - nonfasting glucose in clinic this morning was 234 as he hasn't taken his insulin yet; will do so upon return home - follows with nephrology ([place]) and has to make an appointment  4: Tobacco use- - smoking < 1/2 ppd of cigarettes now and is working on quitting completely - discussed complete cessation for 3 minutes with him  5: Obstructive sleep apnea- - wearing CPAP most nights - difficulty wearing it consistently now due to headaches and  feelings of claustrophobia  - no longer wearing oxygen due to CPAP  Patient did not bring his medications nor a list. Each medication was verbally reviewed with the patient and he was encouraged to bring the bottles to every visit to confirm accuracy of list.  Return here in 1 month or sooner for any questions/problems before then.

## 2016-10-14 ENCOUNTER — Encounter: Payer: Self-pay | Admitting: Family

## 2016-10-14 ENCOUNTER — Ambulatory Visit: Payer: Medicaid Other | Attending: Family | Admitting: Family

## 2016-10-14 VITALS — BP 145/64 | HR 77 | Resp 18 | Ht 70.0 in | Wt 341.2 lb

## 2016-10-14 DIAGNOSIS — Z7982 Long term (current) use of aspirin: Secondary | ICD-10-CM | POA: Diagnosis not present

## 2016-10-14 DIAGNOSIS — Z6841 Body Mass Index (BMI) 40.0 and over, adult: Secondary | ICD-10-CM | POA: Diagnosis not present

## 2016-10-14 DIAGNOSIS — I5032 Chronic diastolic (congestive) heart failure: Secondary | ICD-10-CM | POA: Insufficient documentation

## 2016-10-14 DIAGNOSIS — F17210 Nicotine dependence, cigarettes, uncomplicated: Secondary | ICD-10-CM | POA: Diagnosis not present

## 2016-10-14 DIAGNOSIS — E7800 Pure hypercholesterolemia, unspecified: Secondary | ICD-10-CM | POA: Diagnosis not present

## 2016-10-14 DIAGNOSIS — Z79899 Other long term (current) drug therapy: Secondary | ICD-10-CM | POA: Diagnosis not present

## 2016-10-14 DIAGNOSIS — I130 Hypertensive heart and chronic kidney disease with heart failure and stage 1 through stage 4 chronic kidney disease, or unspecified chronic kidney disease: Secondary | ICD-10-CM | POA: Insufficient documentation

## 2016-10-14 DIAGNOSIS — Z8249 Family history of ischemic heart disease and other diseases of the circulatory system: Secondary | ICD-10-CM | POA: Diagnosis not present

## 2016-10-14 DIAGNOSIS — Z9889 Other specified postprocedural states: Secondary | ICD-10-CM | POA: Diagnosis not present

## 2016-10-14 DIAGNOSIS — I890 Lymphedema, not elsewhere classified: Secondary | ICD-10-CM | POA: Insufficient documentation

## 2016-10-14 DIAGNOSIS — M4800 Spinal stenosis, site unspecified: Secondary | ICD-10-CM | POA: Diagnosis not present

## 2016-10-14 DIAGNOSIS — Z9049 Acquired absence of other specified parts of digestive tract: Secondary | ICD-10-CM | POA: Insufficient documentation

## 2016-10-14 DIAGNOSIS — N184 Chronic kidney disease, stage 4 (severe): Secondary | ICD-10-CM

## 2016-10-14 DIAGNOSIS — K859 Acute pancreatitis without necrosis or infection, unspecified: Secondary | ICD-10-CM | POA: Insufficient documentation

## 2016-10-14 DIAGNOSIS — E1140 Type 2 diabetes mellitus with diabetic neuropathy, unspecified: Secondary | ICD-10-CM | POA: Diagnosis not present

## 2016-10-14 DIAGNOSIS — G4733 Obstructive sleep apnea (adult) (pediatric): Secondary | ICD-10-CM | POA: Insufficient documentation

## 2016-10-14 DIAGNOSIS — Z720 Tobacco use: Secondary | ICD-10-CM

## 2016-10-14 DIAGNOSIS — Z794 Long term (current) use of insulin: Secondary | ICD-10-CM | POA: Insufficient documentation

## 2016-10-14 DIAGNOSIS — E1122 Type 2 diabetes mellitus with diabetic chronic kidney disease: Secondary | ICD-10-CM

## 2016-10-14 MED ORDER — METOLAZONE 2.5 MG PO TABS: 2.5000 mg | ORAL_TABLET | Freq: Every day | ORAL | 0 refills | Status: DC

## 2016-10-14 NOTE — Progress Notes (Signed)
[name] ID: Brian [name], male    DOB: 1961/02/18, 56 y.o.   MRN: [id number]  HPI  Brian [name] is a 56 y/o male with a history of pancreatitis, pneumonia, HTN, hyperlipidemia, DM, COPD, CKD, current tobacco use and chronic heart failure.   Last echo was done 02/09/16 with an EF of 55-60% without valvular regurgitation.   Was in [facility] ED 07/12/16 after a fall. Evaluated and discharged home. Admitted 02/26/16 with respiratory failure due to new onset heart failure. Was diuresed and lost 8 pounds while admitted. Mild troponin elevated thought to be due to demand ischemia. Cardiology consult was obtained. Discharged home after 2 days. Previous admission on 02/07/16 was due to COPD exacerbation. Treated with steroids, nebulizer and IV antibiotics. Discharged Brian next day with oral steroid taper and antibiotics.   Brian [name] presents today for his follow-up visit with a chief complaint of continued swelling in his lower legs. Brian [name] describes this as chronic in nature having been present for several years with varying levels of severity. Brian [name] has associated fatigue, shortness of breath and weight gain along with this. Does have neuropathy in lower legs related to diabetes. Has tried leg elevation and compression socks but without much relief. Hasn't been to [facility] for diabetes/nutrition classes.   Past Medical History:  Diagnosis Date  . CHF (congestive heart failure) ([place])   . COPD (chronic obstructive pulmonary disease) ([place])   . Diabetes mellitus type 2 in obese ([place])   . DM (diabetes mellitus) type II controlled with renal manifestation ([place])   . Hypercholesteremia   . Hypertension   . Morbid obesity with BMI of 45.0-49.9, adult ([place])   . Neuropathy   . Pancreatitis, acute   . Pneumonia   . Spinal stenosis    Past Surgical History:  Procedure Laterality Date  . AMPUTATION    . CHOLECYSTECTOMY  1998  . LAPAROSCOPIC APPENDECTOMY N/A 01/06/2015   Procedure: APPENDECTOMY LAPAROSCOPIC drainage of  peritoneal abcess;  Surgeon: Sherri Rad, MD;  Location: [facility] ORS;  Service: General;  Laterality: N/A;   Family History  Problem Relation Age of Onset  . Hypertension Mother   . Hyperlipidemia Mother   . Heart disease Father   . Heart disease Maternal Grandfather    Social History  Substance Use Topics  . Smoking status: Current Every Day Smoker    Packs/day: 1.00    Types: Cigarettes  . Smokeless tobacco: Never Used  . Alcohol use No   Allergies  Allergen Reactions  . Naproxen Rash    Prior to Admission medications   Medication Sig Start Date End Date Taking? Authorizing Provider  albuterol (PROVENTIL) (2.5 MG/3ML) 0.083% nebulizer solution Take 2.5 mg by nebulization every 4 (four) hours as needed for wheezing or shortness of breath.   Yes [provider]  aspirin EC 81 MG EC tablet Take 1 tablet (81 mg total) by mouth daily. 02/29/16  Yes [name], MD  carvedilol (COREG) 25 MG tablet Take 25 mg by mouth 2 (two) times daily with a meal.   Yes [provider]  cyclobenzaprine (FLEXERIL) 5 MG tablet Take 5 mg by mouth at bedtime.   Yes [provider]  gabapentin (NEURONTIN) 300 MG capsule Take 600 mg by mouth 3 (three) times daily. 11/13/13  Yes [provider]  insulin aspart (NOVOLOG) 100 unit/mL injection Inject 10 Units into Brian vein 3 (three) times daily before meals. 02/28/16  Yes [name], MD  insulin glargine (LANTUS) 100 UNIT/ML injection  Inject 0.35 mLs (35 Units total) into Brian skin at bedtime. [name] taking differently: Inject 50 Units into Brian skin at bedtime. Take 50 units in Brian AM and 40 units at bedtime 02/28/16  Yes [name], MD  mometasone-formoterol [facility]) 100-5 MCG/ACT AERO Inhale 2 puffs into Brian lungs 2 (two) times daily.   Yes [provider]  sertraline (ZOLOFT) 100 MG tablet Take 25 mg by mouth daily.  04/10/13  Yes [provider]  tiotropium (SPIRIVA HANDIHALER) 18 MCG inhalation capsule Place 18  mcg into inhaler and inhale daily.   Yes [provider]  torsemide (DEMADEX) 20 MG tablet Take 1 tablet (20 mg total) by mouth 2 (two) times daily. 04/23/16 10/14/16 Yes Ninamarie Keel, [name], FNP  Vitamin D, Ergocalciferol, (DRISDOL) 50000 units CAPS capsule Take 50,000 Units by mouth every 7 (seven) days.   Yes [provider]    Review of Systems  Constitutional: Positive for fatigue. Negative for appetite change.  HENT: Negative for congestion, postnasal drip and sore throat.   Eyes: Negative.   Respiratory: Positive for shortness of breath (when walking). Negative for cough and chest tightness.   Cardiovascular: Positive for leg swelling. Negative for chest pain and palpitations.  Gastrointestinal: Negative for abdominal distention and abdominal pain.  Endocrine: Negative.   Genitourinary: Negative.   Musculoskeletal: Positive for arthralgias (left leg pain) and back pain (chronic).  Skin: Negative.   Allergic/Immunologic: Negative.   Neurological: Positive for headaches (with CPAP). Negative for dizziness and light-headedness.  Hematological: Negative for adenopathy. Bruises/bleeds easily.  Psychiatric/Behavioral: Negative for dysphoric mood, sleep disturbance (wearing CPAP sometimes) and suicidal ideas. Brian [name] is not nervous/anxious.    Vitals:   10/14/16 0922  BP: (!) 145/64  Pulse: 77  Resp: 18  SpO2: 100%  Weight: (!) 341 lb 4 oz (154.8 kg)  Height: 5\' 10"  (1.778 m)   Wt Readings from Last 3 Encounters:  10/14/16 (!) 341 lb 4 oz (154.8 kg)  09/13/16 (!) 332 lb 4 oz (150.7 kg)  07/12/16 (!) 310 lb (140.6 kg)    Lab Results  Component Value Date   CREATININE 2.43 (H) 02/28/2016   CREATININE 2.66 (H) 02/27/2016   CREATININE 2.29 (H) 02/27/2016    Physical Exam  Constitutional: Brian [name] is oriented to person, place, and time. Brian [name] appears well-developed and well-nourished.  HENT:  Head: Normocephalic and atraumatic.  Neck: Normal range of motion. Neck  supple. No JVD present.  Cardiovascular: Normal rate and regular rhythm.   Pulmonary/Chest: Effort normal. Brian [name] has no wheezes. Brian [name] has no rales.  Abdominal: Soft. Brian [name] exhibits no distension. There is no tenderness.  Musculoskeletal: Brian [name] exhibits edema (3+ pitting edema in bilateral lower legs). Brian [name] exhibits no tenderness.  Neurological: Brian [name] is alert and oriented to person, place, and time.  Skin: Skin is warm and dry.  Psychiatric: Brian [name] has a normal mood and affect. His behavior is normal. Thought content normal.  Nursing note and vitals reviewed.    Assessment & Plan:  1: Chronic heart failure with preserved ejection fraction- - NYHA class II - moderate fluid overload with pedal edema present - already weighing daily. Weight is up 8 pounds from his last visit here. Weight is up ~30 pounds since February. Instructed to call for an overnight weight gain of >2 pounds or a weekly weight gain of >5 pounds.  - currently taking torsemide 20mg  BID so will add metolazone 2.5mg  daily for 3 days - potassium has traditionally been in Brian 5's so  will not add potassium  - not adding salt to his food. Reading food labels.  - unable to do pulmonary rehab right now due to pain in his back related to his spinal stenosis - saw cardiologist ([place]) 03/31/16 and returns 10/20/16; will ask cardiology to check a BMP next week since using metolazone - does not meet ReDs criteria due to BMI - Pharm D went in and reviewed medications with Brian [name]  2: DM- - glucose was 166 at home today - says that Brian [name]'s not eating "much" but admits to not getting any exercise due to back/hip/knee pain - discussed going to [facility] for teaching and [name] says that Brian [name]'s interested so will make that referral - follows with nephrology [facility]) and has to make an appointment  3: Lymphedema-  - stage 2 lymphedema present - does elevate Brian legs but without relief of edema - has worn compression socks but, again, without  much relief of edema - discussed lympha press compression boots and [name] is interested in that so will make that referral as well.   4: Tobacco use- - smoking < 1/2 ppd of cigarettes now and is working on quitting completely - discussed complete cessation for 3 minutes with him  5: Obstructive sleep apnea- - wearing CPAP most nights - difficulty wearing it consistently now due to headaches and feelings of claustrophobia  - no longer wearing oxygen due to CPAP  [name] did not bring his medications nor a list. Each medication was verbally reviewed with Brian [name] and Brian [name] was encouraged to bring Brian bottles to every visit to confirm accuracy of list.  Return here in 3 months or sooner for any questions/problems before then.

## 2016-10-14 NOTE — Patient Instructions (Addendum)
Continue weighing daily and call for an overnight weight gain of > 2 pounds or a weekly weight gain of >5 pounds.  Adding metolazone (booster fluid pill) once daily for 3 days. Best to take it 1/2 hour prior to morning torsemide dose if possible.

## 2016-10-26 ENCOUNTER — Ambulatory Visit: Payer: Self-pay | Admitting: Dietician

## 2016-11-02 ENCOUNTER — Ambulatory Visit: Payer: Self-pay | Admitting: Dietician

## 2016-11-08 ENCOUNTER — Ambulatory Visit: Payer: Medicaid Other | Admitting: Family

## 2016-11-29 ENCOUNTER — Ambulatory Visit: Payer: Medicaid Other | Attending: Family | Admitting: Family

## 2016-11-29 ENCOUNTER — Encounter: Payer: Self-pay | Admitting: Family

## 2016-11-29 VITALS — BP 157/68 | HR 72 | Resp 18 | Ht 70.0 in | Wt 343.2 lb

## 2016-11-29 DIAGNOSIS — I890 Lymphedema, not elsewhere classified: Secondary | ICD-10-CM

## 2016-11-29 DIAGNOSIS — F17210 Nicotine dependence, cigarettes, uncomplicated: Secondary | ICD-10-CM | POA: Insufficient documentation

## 2016-11-29 DIAGNOSIS — E7800 Pure hypercholesterolemia, unspecified: Secondary | ICD-10-CM | POA: Diagnosis not present

## 2016-11-29 DIAGNOSIS — N189 Chronic kidney disease, unspecified: Secondary | ICD-10-CM | POA: Diagnosis not present

## 2016-11-29 DIAGNOSIS — G4733 Obstructive sleep apnea (adult) (pediatric): Secondary | ICD-10-CM | POA: Insufficient documentation

## 2016-11-29 DIAGNOSIS — Z6841 Body Mass Index (BMI) 40.0 and over, adult: Secondary | ICD-10-CM | POA: Diagnosis not present

## 2016-11-29 DIAGNOSIS — E1122 Type 2 diabetes mellitus with diabetic chronic kidney disease: Secondary | ICD-10-CM

## 2016-11-29 DIAGNOSIS — Z7982 Long term (current) use of aspirin: Secondary | ICD-10-CM | POA: Diagnosis not present

## 2016-11-29 DIAGNOSIS — E1140 Type 2 diabetes mellitus with diabetic neuropathy, unspecified: Secondary | ICD-10-CM | POA: Insufficient documentation

## 2016-11-29 DIAGNOSIS — Z720 Tobacco use: Secondary | ICD-10-CM

## 2016-11-29 DIAGNOSIS — I130 Hypertensive heart and chronic kidney disease with heart failure and stage 1 through stage 4 chronic kidney disease, or unspecified chronic kidney disease: Secondary | ICD-10-CM | POA: Insufficient documentation

## 2016-11-29 DIAGNOSIS — I5032 Chronic diastolic (congestive) heart failure: Secondary | ICD-10-CM | POA: Insufficient documentation

## 2016-11-29 DIAGNOSIS — N184 Chronic kidney disease, stage 4 (severe): Secondary | ICD-10-CM

## 2016-11-29 DIAGNOSIS — Z794 Long term (current) use of insulin: Secondary | ICD-10-CM | POA: Diagnosis not present

## 2016-11-29 DIAGNOSIS — J441 Chronic obstructive pulmonary disease with (acute) exacerbation: Secondary | ICD-10-CM | POA: Diagnosis not present

## 2016-11-29 DIAGNOSIS — Z79899 Other long term (current) drug therapy: Secondary | ICD-10-CM | POA: Diagnosis not present

## 2016-11-29 LAB — BASIC METABOLIC PANEL
Anion gap: 8 (ref 5–15)
BUN: 67 mg/dL — AB (ref 6–20)
CHLORIDE: 109 mmol/L (ref 101–111)
CO2: 23 mmol/L (ref 22–32)
CREATININE: 2.55 mg/dL — AB (ref 0.61–1.24)
Calcium: 9.3 mg/dL (ref 8.9–10.3)
GFR calc Af Amer: 31 mL/min — ABNORMAL LOW (ref >60)
GFR calc non Af Amer: 26 mL/min — ABNORMAL LOW (ref >60)
Glucose, Bld: 153 mg/dL — ABNORMAL HIGH (ref 65–99)
Potassium: 6.2 mmol/L — ABNORMAL HIGH (ref 3.5–5.1)
SODIUM: 140 mmol/L (ref 135–145)

## 2016-11-29 NOTE — Patient Instructions (Addendum)
Continue weighing daily and call for an overnight weight gain of > 2 pounds or a weekly weight gain of >5 pounds.  Call Dr. Juleen China and [facility] to schedule appointments

## 2016-11-29 NOTE — Progress Notes (Signed)
Patient ID: Brian [name], male    DOB: [birth year], 56 y.o.   MRN: [id number]  HPI  Brian [name] is a 56 y/o male with a history of pancreatitis, pneumonia, HTN, hyperlipidemia, DM, COPD, CKD, current tobacco use and chronic heart failure.   Last echo was done 02/09/16 with an EF of 55-60% without valvular regurgitation.   Was in the ED 07/12/16 after a fall. Evaluated and discharged home. Admitted 02/26/16 with respiratory failure due to new onset heart failure. Was diuresed and lost 8 pounds while admitted. Mild troponin elevated thought to be due to demand ischemia. Cardiology consult was obtained. Discharged home after 2 days. Previous admission on 02/07/16 was due to COPD exacerbation. Treated with steroids, nebulizer and IV antibiotics. Discharged the next day with oral steroid taper and antibiotics.   He presents today for his follow-up visit with a chief complaint of continued edema in bilateral lower legs. He describes this as chronic in nature having been present for several years with varying levels of severity. He has associated fatigue, shortness of breath, weakness and slight weight gain along with this. He denies any chest pain, wheezing or dizziness.   Past Medical History:  Diagnosis Date  . CHF (congestive heart failure) ([place])   . COPD (chronic obstructive pulmonary disease) ([place])   . Diabetes mellitus type 2 in obese ([place])   . DM (diabetes mellitus) type II controlled with renal manifestation ([place])   . Hypercholesteremia   . Hypertension   . Morbid obesity with BMI of 45.0-49.9, adult ([place])   . Neuropathy   . Pancreatitis, acute   . Pneumonia   . Spinal stenosis    Past Surgical History:  Procedure Laterality Date  . AMPUTATION    . CHOLECYSTECTOMY  1998  . LAPAROSCOPIC APPENDECTOMY N/A 01/06/2015   Procedure: APPENDECTOMY LAPAROSCOPIC drainage of peritoneal abcess;  Surgeon: Sherri Rad, MD;  Location: [facility] ORS;  Service: General;  Laterality: N/A;   Family History   Problem Relation Age of Onset  . Hypertension Mother   . Hyperlipidemia Mother   . Heart disease Father   . Heart disease Maternal Grandfather    Social History  Substance Use Topics  . Smoking status: Current Every Day Smoker    Packs/day: 1.00    Types: Cigarettes  . Smokeless tobacco: Never Used  . Alcohol use No   Allergies  Allergen Reactions  . Naproxen Rash   Prior to Admission medications   Medication Sig Start Date End Date Taking? Authorizing Provider  albuterol (PROVENTIL) (2.5 MG/3ML) 0.083% nebulizer solution Take 2.5 mg by nebulization every 4 (four) hours as needed for wheezing or shortness of breath.   Yes [provider]  aspirin EC 81 MG EC tablet Take 1 tablet (81 mg total) by mouth daily. 02/29/16  Yes [name], MD  carvedilol (COREG) 25 MG tablet Take 25 mg by mouth 2 (two) times daily with a meal.   Yes [provider]  cyclobenzaprine (FLEXERIL) 5 MG tablet Take 5 mg by mouth at bedtime.   Yes [provider]  gabapentin (NEURONTIN) 300 MG capsule Take 600 mg by mouth 3 (three) times daily. 11/13/13  Yes [provider]  insulin aspart (NOVOLOG) 100 unit/mL injection Inject 10 Units into the vein 3 (three) times daily before meals. 02/28/16  Yes [name], MD  insulin glargine (LANTUS) 100 UNIT/ML injection Inject into the skin at bedtime. 50u in the AM and 45 u in the PM   Yes  [provider]  metolazone (ZAROXOLYN) 2.5 MG tablet Take 1 tablet (2.5 mg total) by mouth daily. 10/14/16 01/12/17 Yes Jacalynn Buzzell A, FNP  mometasone-formoterol (DULERA) 100-5 MCG/ACT AERO Inhale 2 puffs into the lungs 2 (two) times daily.   Yes [provider]  sertraline (ZOLOFT) 100 MG tablet Take 25 mg by mouth daily.  04/10/13  Yes [provider]  tiotropium (SPIRIVA HANDIHALER) 18 MCG inhalation capsule Place 18 mcg into inhaler and inhale daily.   Yes [provider]  torsemide (DEMADEX) 20 MG tablet Take 1  tablet (20 mg total) by mouth 2 (two) times daily. 04/23/16 11/29/16 Yes Winni Ehrhard, [name], FNP  Vitamin D, Ergocalciferol, (DRISDOL) 50000 units CAPS capsule Take 50,000 Units by mouth every 7 (seven) days.   Yes [provider]   Review of Systems  Constitutional: Positive for fatigue. Negative for appetite change.  HENT: Negative for congestion, postnasal drip and sore throat.   Eyes: Negative.   Respiratory: Positive for shortness of breath (minimal). Negative for cough and chest tightness.   Cardiovascular: Positive for leg swelling. Negative for chest pain and palpitations.  Gastrointestinal: Negative for abdominal distention and abdominal pain.  Endocrine: Negative.   Genitourinary: Negative.   Musculoskeletal: Positive for arthralgias (left leg pain) and back pain (chronic).  Skin: Negative.   Allergic/Immunologic: Negative.   Neurological: Positive for weakness (in legs). Negative for dizziness, light-headedness and headaches.  Hematological: Negative for adenopathy. Bruises/bleeds easily.  Psychiatric/Behavioral: Negative for dysphoric mood, sleep disturbance (wearing CPAP sometimes) and suicidal ideas. The patient is not nervous/anxious.    Vitals:   11/29/16 1141  BP: (!) 157/68  Pulse: 72  Resp: 18  SpO2: 97%  Weight: (!) 343 lb 4 oz (155.7 kg)  Height: 5\' 10"  (1.778 m)   Wt Readings from Last 3 Encounters:  11/29/16 (!) 343 lb 4 oz (155.7 kg)  10/14/16 (!) 341 lb 4 oz (154.8 kg)  09/13/16 (!) 332 lb 4 oz (150.7 kg)    Lab Results  Component Value Date   CREATININE 2.43 (H) 02/28/2016   CREATININE 2.66 (H) 02/27/2016   CREATININE 2.29 (H) 02/27/2016    Physical Exam  Constitutional: He is oriented to person, place, and time. He appears well-developed and well-nourished.  HENT:  Head: Normocephalic and atraumatic.  Neck: Normal range of motion. Neck supple. No JVD present.  Cardiovascular: Normal rate and regular rhythm.   Pulmonary/Chest: Effort normal.  He has no wheezes. He has no rales.  Abdominal: Soft. He exhibits no distension. There is no tenderness.  Musculoskeletal: He exhibits edema (2+ pitting edema in bilateral lower legs). He exhibits no tenderness.  Neurological: He is alert and oriented to person, place, and time.  Skin: Skin is warm and dry.  Psychiatric: He has a normal mood and affect. His behavior is normal. Thought content normal.  Nursing note and vitals reviewed.    Assessment & Plan:  1: Chronic heart failure with preserved ejection fraction- - NYHA class II - moderate fluid overload with pedal edema present although improving - already weighing daily. Weight is up 2 pounds from his last visit here. Weight is up ~30 pounds since February. Instructed to call for an overnight weight gain of >2 pounds or a weekly weight gain of >5 pounds.  - currently taking torsemide 20mg  BID  - potassium has traditionally been in the 5's  - not adding salt to his food. Reading food labels.  - unable to do pulmonary rehab right now  due to pain in his back related to his spinal stenosis - saw cardiologist ([place]) 10/29/16 and returns 01/28/17 - will get a BMP today since it hasn't been done since he was last here - does not meet ReDs criteria due to BMI  2: DM- - glucose running 110-120's at home in the mornings - says that he's not eating "much" but admits to not getting any exercise due to back/hip/knee pain - he has to call [facility] back to reschedule his appointment - follows with nephrology  [facility]) and has to make an appointment - sees PCP Brunetta Genera) 12/06/16  3: Lymphedema-  - stage 2 lymphedema present - does elevate the legs but without relief of edema - has worn compression socks but, again, without much relief of edema - continues to have edema even after wearing compression socks and elevating his legs; feels like the edema is worse first thing in the morning  4: Tobacco use- - smoking < 1/2 ppd of  cigarettes now and is working on quitting completely - discussed complete cessation for 3 minutes with him  5: Obstructive sleep apnea- - wearing CPAP most nights - difficulty wearing it consistently due to feeling of claustrophobia  - no longer wearing oxygen due to CPAP  Patient did not bring his medications nor a list. Each medication was verbally reviewed with the patient and he was encouraged to bring the bottles to every visit to confirm accuracy of list.  Return in 1 month or sooner for any questions/problems before then.

## 2016-11-30 ENCOUNTER — Telehealth: Payer: Self-pay | Admitting: Family

## 2016-11-30 MED ORDER — SODIUM POLYSTYRENE SULFONATE PO POWD: Freq: Once | ORAL | 0 refills | Status: AC

## 2016-11-30 NOTE — Telephone Encounter (Signed)
After hearing back from cardiologist, will give one dose of kayexalate (15 grams).  RX sent to pharmacy and patient advised of recommendations.

## 2016-11-30 NOTE — Telephone Encounter (Signed)
Spoke with patient regarding lab results that were obtained 11/29/16. Potassium level is now 6.2 (has been running upper 5's). GFR is 26 with a creatinine of 2.55 (slightly worse). Reviewed high potassium foods (bananas, oranges, potatoes, dark green leafy vegetables) and he says that he doesn't eat those foods hardly at all.  Not taking potassium supplements, spironolactone or ARB/Ace-I. Is on torsemide 20mg  BID.  Had not made a follow-up appointment with nephrology so an appointment was scheduled for him with Dr. Juleen China for January 06, 2017 at 2:40pm. Labs were faxed to his office and his nurse says that if Dr. Juleen China feels like patient needs to be seen sooner, his office will call the patient.  Patient verbalized understanding.

## 2016-12-01 ENCOUNTER — Other Ambulatory Visit: Payer: Self-pay | Admitting: Family

## 2016-12-01 MED ORDER — SODIUM POLYSTYRENE SULFONATE 15 GM/60ML PO SUSP: 15.0000 g | Freq: Once | ORAL | 0 refills | Status: AC

## 2016-12-01 NOTE — Progress Notes (Signed)
Pharmacy did not have powder form of kayexalate so RX for suspension was sent in.

## 2016-12-29 ENCOUNTER — Emergency Department: Payer: Medicaid Other

## 2016-12-29 ENCOUNTER — Inpatient Hospital Stay
Admission: EM | Admit: 2016-12-29 | Discharge: 2017-01-06 | DRG: 291 | Disposition: A | Discharge status: Home Health | Payer: Medicaid Other | Attending: Internal Medicine | Admitting: Internal Medicine

## 2016-12-29 ENCOUNTER — Encounter: Payer: Self-pay | Admitting: Emergency Medicine

## 2016-12-29 ENCOUNTER — Other Ambulatory Visit: Payer: Self-pay

## 2016-12-29 DIAGNOSIS — E039 Hypothyroidism, unspecified: Secondary | ICD-10-CM | POA: Diagnosis present

## 2016-12-29 DIAGNOSIS — E785 Hyperlipidemia, unspecified: Secondary | ICD-10-CM | POA: Diagnosis present

## 2016-12-29 DIAGNOSIS — K767 Hepatorenal syndrome: Secondary | ICD-10-CM | POA: Diagnosis present

## 2016-12-29 DIAGNOSIS — R600 Localized edema: Secondary | ICD-10-CM | POA: Diagnosis present

## 2016-12-29 DIAGNOSIS — J9602 Acute respiratory failure with hypercapnia: Secondary | ICD-10-CM | POA: Diagnosis not present

## 2016-12-29 DIAGNOSIS — E1122 Type 2 diabetes mellitus with diabetic chronic kidney disease: Secondary | ICD-10-CM | POA: Diagnosis present

## 2016-12-29 DIAGNOSIS — Z6841 Body Mass Index (BMI) 40.0 and over, adult: Secondary | ICD-10-CM | POA: Diagnosis not present

## 2016-12-29 DIAGNOSIS — F329 Major depressive disorder, single episode, unspecified: Secondary | ICD-10-CM | POA: Diagnosis present

## 2016-12-29 DIAGNOSIS — I5033 Acute on chronic diastolic (congestive) heart failure: Secondary | ICD-10-CM | POA: Diagnosis present

## 2016-12-29 DIAGNOSIS — E1142 Type 2 diabetes mellitus with diabetic polyneuropathy: Secondary | ICD-10-CM | POA: Diagnosis present

## 2016-12-29 DIAGNOSIS — Z9989 Dependence on other enabling machines and devices: Secondary | ICD-10-CM

## 2016-12-29 DIAGNOSIS — Z8249 Family history of ischemic heart disease and other diseases of the circulatory system: Secondary | ICD-10-CM

## 2016-12-29 DIAGNOSIS — E291 Testicular hypofunction: Secondary | ICD-10-CM | POA: Diagnosis present

## 2016-12-29 DIAGNOSIS — J9622 Acute and chronic respiratory failure with hypercapnia: Secondary | ICD-10-CM | POA: Diagnosis present

## 2016-12-29 DIAGNOSIS — G4733 Obstructive sleep apnea (adult) (pediatric): Secondary | ICD-10-CM | POA: Diagnosis present

## 2016-12-29 DIAGNOSIS — R531 Weakness: Secondary | ICD-10-CM

## 2016-12-29 DIAGNOSIS — Z23 Encounter for immunization: Secondary | ICD-10-CM

## 2016-12-29 DIAGNOSIS — I248 Other forms of acute ischemic heart disease: Secondary | ICD-10-CM | POA: Diagnosis present

## 2016-12-29 DIAGNOSIS — E876 Hypokalemia: Secondary | ICD-10-CM | POA: Diagnosis not present

## 2016-12-29 DIAGNOSIS — R296 Repeated falls: Secondary | ICD-10-CM | POA: Diagnosis present

## 2016-12-29 DIAGNOSIS — Z7982 Long term (current) use of aspirin: Secondary | ICD-10-CM

## 2016-12-29 DIAGNOSIS — E872 Acidosis, unspecified: Secondary | ICD-10-CM

## 2016-12-29 DIAGNOSIS — I959 Hypotension, unspecified: Secondary | ICD-10-CM | POA: Diagnosis present

## 2016-12-29 DIAGNOSIS — E1129 Type 2 diabetes mellitus with other diabetic kidney complication: Secondary | ICD-10-CM | POA: Diagnosis present

## 2016-12-29 DIAGNOSIS — S7001XA Contusion of right hip, initial encounter: Secondary | ICD-10-CM

## 2016-12-29 DIAGNOSIS — J441 Chronic obstructive pulmonary disease with (acute) exacerbation: Secondary | ICD-10-CM | POA: Diagnosis present

## 2016-12-29 DIAGNOSIS — W010XXA Fall on same level from slipping, tripping and stumbling without subsequent striking against object, initial encounter: Secondary | ICD-10-CM | POA: Diagnosis present

## 2016-12-29 DIAGNOSIS — I132 Hypertensive heart and chronic kidney disease with heart failure and with stage 5 chronic kidney disease, or end stage renal disease: Principal | ICD-10-CM | POA: Diagnosis present

## 2016-12-29 DIAGNOSIS — Z452 Encounter for adjustment and management of vascular access device: Secondary | ICD-10-CM

## 2016-12-29 DIAGNOSIS — Z794 Long term (current) use of insulin: Secondary | ICD-10-CM

## 2016-12-29 DIAGNOSIS — J449 Chronic obstructive pulmonary disease, unspecified: Secondary | ICD-10-CM | POA: Diagnosis not present

## 2016-12-29 DIAGNOSIS — Z79899 Other long term (current) drug therapy: Secondary | ICD-10-CM

## 2016-12-29 DIAGNOSIS — Z992 Dependence on renal dialysis: Secondary | ICD-10-CM

## 2016-12-29 DIAGNOSIS — Y92009 Unspecified place in unspecified non-institutional (private) residence as the place of occurrence of the external cause: Secondary | ICD-10-CM

## 2016-12-29 DIAGNOSIS — J9600 Acute respiratory failure, unspecified whether with hypoxia or hypercapnia: Secondary | ICD-10-CM

## 2016-12-29 DIAGNOSIS — I10 Essential (primary) hypertension: Secondary | ICD-10-CM | POA: Diagnosis present

## 2016-12-29 DIAGNOSIS — F17210 Nicotine dependence, cigarettes, uncomplicated: Secondary | ICD-10-CM | POA: Diagnosis present

## 2016-12-29 DIAGNOSIS — N186 End stage renal disease: Secondary | ICD-10-CM | POA: Diagnosis present

## 2016-12-29 DIAGNOSIS — N179 Acute kidney failure, unspecified: Secondary | ICD-10-CM | POA: Diagnosis present

## 2016-12-29 DIAGNOSIS — E662 Morbid (severe) obesity with alveolar hypoventilation: Secondary | ICD-10-CM | POA: Diagnosis present

## 2016-12-29 DIAGNOSIS — J9621 Acute and chronic respiratory failure with hypoxia: Secondary | ICD-10-CM | POA: Diagnosis present

## 2016-12-29 LAB — BLOOD GAS, VENOUS
Acid-base deficit: 12.8 mmol/L — ABNORMAL HIGH (ref 0.0–2.0)
Bicarbonate: 17.7 mmol/L — ABNORMAL LOW (ref 20.0–28.0)
O2 SAT: 70.2 %
PCO2 VEN: 64 mmHg — AB (ref 44.0–60.0)
Patient temperature: 37
pH, Ven: 7.05 — CL (ref 7.250–7.430)
pO2, Ven: 54 mmHg — ABNORMAL HIGH (ref 32.0–45.0)

## 2016-12-29 LAB — CBC
HCT: 30.5 % — ABNORMAL LOW (ref 40.0–52.0)
Hemoglobin: 9.9 g/dL — ABNORMAL LOW (ref 13.0–18.0)
MCH: 30.1 pg (ref 26.0–34.0)
MCHC: 32.4 g/dL (ref 32.0–36.0)
MCV: 92.9 fL (ref 80.0–100.0)
Platelets: 207 10*3/uL (ref 150–440)
RBC: 3.28 MIL/uL — ABNORMAL LOW (ref 4.40–5.90)
RDW: 15.6 % — ABNORMAL HIGH (ref 11.5–14.5)
WBC: 10.4 10*3/uL (ref 3.8–10.6)

## 2016-12-29 LAB — BASIC METABOLIC PANEL
ANION GAP: 12 (ref 5–15)
BUN: 71 mg/dL — ABNORMAL HIGH (ref 6–20)
CALCIUM: 8.4 mg/dL — AB (ref 8.9–10.3)
CO2: 18 mmol/L — AB (ref 22–32)
CREATININE: 4.4 mg/dL — AB (ref 0.61–1.24)
Chloride: 109 mmol/L (ref 101–111)
GFR calc Af Amer: 16 mL/min — ABNORMAL LOW (ref >60)
GFR calc non Af Amer: 14 mL/min — ABNORMAL LOW (ref >60)
GLUCOSE: 189 mg/dL — AB (ref 65–99)
Potassium: 4.7 mmol/L (ref 3.5–5.1)
Sodium: 139 mmol/L (ref 135–145)

## 2016-12-29 LAB — BRAIN NATRIURETIC PEPTIDE: B Natriuretic Peptide: 547 pg/mL — ABNORMAL HIGH (ref 0.0–100.0)

## 2016-12-29 LAB — PROTIME-INR
INR: 1.09
Prothrombin Time: 14 s (ref 11.4–15.2)

## 2016-12-29 LAB — TROPONIN I: Troponin I: 0.84 ng/mL

## 2016-12-29 LAB — APTT: aPTT: 33 s (ref 24–36)

## 2016-12-29 MED ORDER — HEPARIN (PORCINE) IN NACL 100-0.45 UNIT/ML-% IJ SOLN
1300.0000 [IU]/h | INTRAMUSCULAR | Status: DC
  Administered 2016-12-29: 1000 [IU]/h via INTRAVENOUS
  Filled 2016-12-29: qty 250

## 2016-12-29 MED ORDER — HEPARIN SODIUM (PORCINE) 5000 UNIT/ML IJ SOLN
INTRAMUSCULAR | Status: AC
  Filled 2016-12-29: qty 1

## 2016-12-29 MED ORDER — IPRATROPIUM-ALBUTEROL 0.5-2.5 (3) MG/3ML IN SOLN
3.0000 mL | Freq: Once | RESPIRATORY_TRACT | Status: AC
  Administered 2016-12-29: 3 mL via RESPIRATORY_TRACT
  Filled 2016-12-29: qty 3

## 2016-12-29 MED ORDER — SODIUM CHLORIDE 0.9 % IV BOLUS (SEPSIS)
500.0000 mL | Freq: Once | INTRAVENOUS | Status: AC
  Administered 2016-12-29: 500 mL via INTRAVENOUS

## 2016-12-29 MED ORDER — HEPARIN BOLUS VIA INFUSION
4000.0000 [IU] | Freq: Once | INTRAVENOUS | Status: AC
  Administered 2016-12-29: 4000 [IU] via INTRAVENOUS
  Filled 2016-12-29: qty 4000

## 2016-12-29 MED ORDER — ASPIRIN 81 MG PO CHEW
324.0000 mg | CHEWABLE_TABLET | Freq: Once | ORAL | Status: AC
  Administered 2016-12-29: 324 mg via ORAL
  Filled 2016-12-29: qty 4

## 2016-12-29 MED ORDER — METHYLPREDNISOLONE SODIUM SUCC 125 MG IJ SOLR
125.0000 mg | Freq: Once | INTRAMUSCULAR | Status: AC
  Administered 2016-12-29: 125 mg via INTRAVENOUS
  Filled 2016-12-29: qty 2

## 2016-12-29 NOTE — ED Provider Notes (Signed)
[facility] Emergency Department Provider Note ____________________________________________   First MD Initiated Contact with Patient 12/29/16 1615     (approximate)  I have reviewed the triage vital signs and the nursing notes.   HISTORY  Chief Complaint Weakness and Fall    HPI Brian [name] is a 56 y.o. male with a past medical history of COPD, CHF, diabetes, hypertension, obesity, OSA, and spinal stenosis, who presents primarily with increased hip pain after multiple falls, worse on the right but present bilaterally, associated with chronic weakness to lower extremities, and not associated with numbness or incontinence.  Patient states he has frequent falls but has had several in the last week, most recently today when he was sitting on the edge of his bed, reached to the right to address her, and lost his balance and fell onto his right side.  He denies head injury or LOC.  He states his legs frequently give out on him.  He also reports increased bilateral lower extremity swelling over the last several weeks, and gradual onset of worsening shortness of breath.  He denies cough or fever.  He denies chest pain.  He reports small amount of diarrhea, but no other acute symptoms.  Past Medical History:  Diagnosis Date  . CHF (congestive heart failure) ([place])   . COPD (chronic obstructive pulmonary disease) ([place])   . Diabetes mellitus type 2 in obese ([place])   . DM (diabetes mellitus) type II controlled with renal manifestation ([place])   . Hypercholesteremia   . Hypertension   . Morbid obesity with BMI of 45.0-49.9, adult ([place])   . Neuropathy   . Pancreatitis, acute   . Pneumonia   . Spinal stenosis     Patient Active Problem List   Diagnosis Date Noted  . Lymphedema of both lower extremities 10/14/2016  . Obstructive sleep apnea 09/13/2016  . Chronic diastolic heart failure ([place]) 03/28/2016  . Tobacco use 03/28/2016  . Snoring 03/28/2016  .  Hyperkalemia 03/28/2016  . COPD exacerbation ([place]) 02/08/2016  . DM (diabetes mellitus) type II controlled with renal manifestation ([place]) 01/05/2015  . Neuropathy ([place])   . Hypercholesteremia   . Clinical depression 01/17/2013  . HLD (hyperlipidemia) 01/17/2013  . BP (high blood pressure) 01/17/2013  . Gonalgia 01/17/2013  . Morbid obesity ([place]) 01/17/2013  . Amputated toe ([place]) 01/17/2013  . Diabetes mellitus ([place]) 01/17/2013    Past Surgical History:  Procedure Laterality Date  . AMPUTATION    . CHOLECYSTECTOMY  1998    Prior to Admission medications   Medication Sig Start Date End Date Taking? Authorizing Provider  albuterol (PROVENTIL HFA;VENTOLIN HFA) 108 (90 Base) MCG/ACT inhaler Inhale 1-2 puffs every 4 (four) hours as needed into the lungs for wheezing or shortness of breath.   Yes [provider]  atorvastatin (LIPITOR) 80 MG tablet Take 80 mg daily by mouth.   Yes [provider]  carvedilol (COREG) 25 MG tablet Take 25 mg by mouth 2 (two) times daily with a meal.   Yes [provider]  fluticasone (FLOVENT HFA) 220 MCG/ACT inhaler Inhale 2 (two) times daily into the lungs.   Yes [provider]  gabapentin (NEURONTIN) 300 MG capsule Take 600 mg by mouth 3 (three) times daily. 11/13/13  Yes [provider]  insulin aspart (NOVOLOG) 100 unit/mL injection Inject 10 Units into the vein 3 (three) times daily before meals. 02/28/16  Yes [name], MD  insulin glargine (LANTUS) 100 UNIT/ML injection Inject at  bedtime into the skin.    Yes [provider]  levothyroxine (SYNTHROID, LEVOTHROID) 50 MCG tablet Take 50 mcg daily before breakfast by mouth.   Yes [provider]  linagliptin (TRADJENTA) 5 MG TABS tablet Take 5 mg daily by mouth.   Yes [provider]  losartan (COZAAR) 50 MG tablet Take 50 mg daily by mouth.   Yes [provider]  sertraline (ZOLOFT) 100 MG tablet Take 25 mg by mouth daily.   04/10/13  Yes [provider]  torsemide (DEMADEX) 20 MG tablet Take 1 tablet (20 mg total) by mouth 2 (two) times daily. 04/23/16 12/29/16 Yes Hackney, [name], FNP  Vitamin D, Ergocalciferol, (DRISDOL) 50000 units CAPS capsule Take 50,000 Units by mouth every 7 (seven) days.   Yes [provider]  aspirin EC 81 MG EC tablet Take 1 tablet (81 mg total) by mouth daily. 02/29/16   [name], MD  cyclobenzaprine (FLEXERIL) 5 MG tablet Take 5 mg by mouth at bedtime.    [provider]  pioglitazone (ACTOS) 45 MG tablet Take by mouth.    [provider]  Tiotropium Bromide-Olodaterol (STIOLTO RESPIMAT) 2.5-2.5 MCG/ACT AERS Inhale into the lungs.    [provider]    Allergies Naproxen  Family History  Problem Relation Age of Onset  . Hypertension Mother   . Hyperlipidemia Mother   . Heart disease Father   . Heart disease Maternal Grandfather     Social History Social History   Tobacco Use  . Smoking status: Current Every Day Smoker    Packs/day: 1.00    Types: Cigarettes  . Smokeless tobacco: Never Used  Substance Use Topics  . Alcohol use: No  . Drug use: No    Review of Systems  Constitutional: No fever/chills Eyes: No redness. ENT: No sore throat. Cardiovascular: Denies chest pain. Respiratory: Positive for shortness of breath. Gastrointestinal: No nausea, no vomiting.  Positive for diarrhea.  Genitourinary: Negative for dysuria.  Musculoskeletal: Positive for chronic back pain. Skin: Negative for rash. Neurological: Negative for headache.   ____________________________________________   PHYSICAL EXAM:  VITAL SIGNS: ED Triage Vitals  Enc Vitals Group     BP --      Pulse --      Resp --      Temp --      Temp src --      SpO2 12/29/16 1605 95 %     Weight 12/29/16 1606 (!) 340 lb (154.2 kg)     Height 12/29/16 1606 5\' 10"  (1.778 m)     Head Circumference --      Peak Flow --      Pain Score 12/29/16 1606 9      Pain Loc --      Pain Edu? --      Excl. in [place]? --     Constitutional: Alert and oriented. Well appearing and in no acute distress. Eyes: Conjunctivae are normal.  Head: Atraumatic. Nose: No congestion/rhinnorhea. Mouth/Throat: Mucous membranes are moist.   Neck: Normal range of motion.  Cardiovascular: Normal rate, regular rhythm. Grossly normal heart sounds.  Good peripheral circulation. Respiratory: Normal respiratory effort.  No retractions. Dec breath sounds bilat but otherwise CTAB. Gastrointestinal: Soft and nontender. No distention.  Genitourinary: No flank tenderness. Musculoskeletal: 2+ bilateral lower extremity edema.  Extremities warm and well perfused. FROM at bilat hips and knees.  Neurologic:  Normal speech and language. No gross focal neurologic deficits are appreciated. 5/5 motor strength  and intact sensation to all extremities.  Cranial nerves III-XII intact.  Normal coodination.  Skin:  Skin is warm and dry. L anterior lower leg with 2cm superficial, dry, healing ulcer; <1cm surrounding ring of slight erythema with no warmth or induration.  Psychiatric: Mood and affect are normal. Speech and behavior are normal.  ____________________________________________   LABS (all labs ordered are listed, but only abnormal results are displayed)  Labs Reviewed  BASIC METABOLIC PANEL - Abnormal; Notable for the following components:      Result Value   CO2 18 (*)    Glucose, Bld 189 (*)    BUN 71 (*)    Creatinine, Ser 4.40 (*)    Calcium 8.4 (*)    GFR calc non Af Amer 14 (*)    GFR calc Af Amer 16 (*)    All other components within normal limits  CBC - Abnormal; Notable for the following components:   RBC 3.28 (*)    Hemoglobin 9.9 (*)    HCT 30.5 (*)    RDW 15.6 (*)    All other components within normal limits  TROPONIN I - Abnormal; Notable for the following components:   Troponin I 0.84 (*)    All other components within normal limits  BRAIN NATRIURETIC PEPTIDE  - Abnormal; Notable for the following components:   B Natriuretic Peptide 547.0 (*)    All other components within normal limits  BLOOD GAS, VENOUS - Abnormal; Notable for the following components:   pH, Ven 7.05 (*)    pCO2, Ven 64 (*)    pO2, Ven 54.0 (*)    Bicarbonate 17.7 (*)    Acid-base deficit 12.8 (*)    All other components within normal limits  URINALYSIS, COMPLETE (UACMP) WITH MICROSCOPIC  APTT  PROTIME-INR  HEPARIN LEVEL (UNFRACTIONATED)   ____________________________________________  EKG  ED ECG REPORT I, Arta Silence, the attending physician, personally viewed and interpreted this ECG.  Date: 12/29/2016 EKG Time: 16:08 Rate: 75 Rhythm: normal sinus rhythm QRS Axis: normal Intervals: prolonged PR ST/T Wave abnormalities: T wave inversions V5-V6 Narrative Interpretation: nonspecific; no significant change from EKG dated 03/04/16  ED ECG REPORT I, Arta Silence, the attending physician, personally viewed and interpreted this ECG.  Date: 12/29/2016 EKG Time: 1909 Rate: T9 Rhythm: normal sinus rhythm QRS Axis: normal Intervals: borderline prolonged PR ST/T Wave abnormalities: T wave inversions V5 to V6 Narrative Interpretation: No significant change when compared to EKG done earlier today   ____________________________________________  RADIOLOGY  CXR: mild diffuse pulmonary congestion Hip XR: no acute fracture on R or L  ____________________________________________   PROCEDURES  Procedure(s) performed: No    Critical Care performed: Yes  CRITICAL CARE Performed by: Arta Silence   Total critical care time: 45 minutes  Critical care time was exclusive of separately billable procedures and treating other patients.  Critical care was necessary to treat or prevent imminent or life-threatening deterioration.  Critical care was time spent personally by me on the following activities: development of treatment plan with patient  and/or surrogate as well as nursing, discussions with consultants, evaluation of patient's response to treatment, examination of patient, obtaining history from patient or surrogate, ordering and performing treatments and interventions, ordering and review of laboratory studies, ordering and review of radiographic studies, pulse oximetry and re-evaluation of patient's condition. ____________________________________________   INITIAL IMPRESSION / ASSESSMENT AND PLAN / ED COURSE  Pertinent labs & imaging results that were available during my care of the patient were reviewed  by me and considered in my medical decision making (see chart for details).  56 year old male with past medical history as noted above presents primarily for bilateral hip pain after multiple recent falls and increased weakness, but also reports gradual onset of worsening shortness of breath and bilateral lower extremity swelling.     Review of past medical records in [place] reveals visit in May of this year for a fall with negative imaging, as well as an admission in January of this year for acute pulmonary edema and COPD exacerbation.  On exam, patient is relatively well-appearing, vital signs are normal, and the exam is as described above with no obvious musculoskeletal injury, and neuro exam intact to bilateral lower extremities.   1. Fall: ddx contusion vs less likely acute fracture. Will obtain XRs.  Normal neuro at this time; there is no evidence for acute impingement on spinal cord.  Will perform trial of ambulation if imaging negative.  2. Shortness of breath: ddx most likely COPD, vs CHF/fluid overload, bronchitis, pneumonia.  No evidence of acute respiratory failure.  Will obtain CXR, lab workup, EKG and reassess.    ----------------------------------------- 7:26 PM on 12/29/2016 -----------------------------------------  Imaging of the hips is negative.  Chest x-ray also shows no significant acute findings.   Patient has significantly elevated troponin.  He denied chest pain initially, and when I asked him again he continues to have no pain.  Patient has nonspecific EKG findings which are not acute, and there are no dynamic changes here.  I consulted Dr. Clayborn Bigness from cardiology who states that he recommend either repeat troponin and no acute intervention if it clears, versus heparin drip if patient is to be admitted.  On reassessment, patient was somewhat more somnolent than initially, however when I performed a sternal rub he awoke fully and was able to answer my questions.  We will obtain a VBG to rule out acute hypercapnia from COPD.    ----------------------------------------- 8:27 PM on 12/29/2016 -----------------------------------------  Patient is now significant.  VBG does confirm relatively elevated CO2.  Patient placed on BiPAP and is tolerating it well, and I added on Solu-Medrol and nebs.  Heparin drip started.  I discussed the case with hospitalist, Dr. [name], and will proceed with admission.  ____________________________________________   FINAL CLINICAL IMPRESSION(S) / ED DIAGNOSES  Final diagnoses:  COPD with respiratory failure, acute (HCC)  Generalized weakness  Acidosis  Contusion of right hip, initial encounter      NEW MEDICATIONS STARTED DURING THIS VISIT:  This SmartLink is deprecated. Use AVSMEDLIST instead to display the medication list for a patient.   Note:  This document was prepared using Dragon voice recognition software and may include unintentional dictation errors.    Arta Silence, MD 12/29/16 2027

## 2016-12-29 NOTE — ED Triage Notes (Signed)
Pt presents to ED via [facility] for fall and weakness. EMS reports pt fell between mattress and wall. EMS reports hx of spinal stenosis, increasing falls, and lower back pain. EMS reports 5 falls in the last week, increasing SHOB with exertion and increasing weakness. 95-97% on RA, semi-productive cough, 20g to L AC at this time. CBG 191. Pt reports increasing fluid accumulation in his lower legs.

## 2016-12-29 NOTE — H&P (Signed)
[facility] [place] NAME: Brian [name]    MR#:  [id number]  DATE OF BIRTH:  [birth year]  DATE OF ADMISSION:  12/29/2016  PRIMARY CARE PHYSICIAN: Lorelee Market, MD   REQUESTING/REFERRING PHYSICIAN: Cherylann Banas, MD  CHIEF COMPLAINT:   Chief Complaint  Patient presents with  . Weakness  . Fall    HISTORY OF PRESENT ILLNESS:  Brian [name]  is a 56 y.o. male who presents with complaint of overall progressive weakness.  Initial workup in the ED showed potentially some heart failure exacerbation, though his troponin was fairly elevated at 0.8.  He has not complained of any cardiac symptoms, denies any chest pain, diaphoresis, increased shortness of breath.  While here in the ED his mental status decompensated, he became lethargic and then less responsive overall.  Venous blood gas showed CO2 elevated at 60 and pH of 7.05.  His bicarb is also low, but he also has some acute on chronic renal failure with a creatinine up to 4 today a baseline of around 2.  He was placed on BiPAP and hospitalist were called for admission.  Of note, family states he supposed to be using CPAP at home at night but often does not.  PAST MEDICAL HISTORY:   Past Medical History:  Diagnosis Date  . CHF (congestive heart failure) ([place])   . COPD (chronic obstructive pulmonary disease) ([place])   . Diabetes mellitus type 2 in obese ([place])   . DM (diabetes mellitus) type II controlled with renal manifestation ([place])   . Hypercholesteremia   . Hypertension   . Morbid obesity with BMI of 45.0-49.9, adult ([place])   . Neuropathy   . Pancreatitis, acute   . Pneumonia   . Spinal stenosis     PAST SURGICAL HISTORY:   Past Surgical History:  Procedure Laterality Date  . AMPUTATION    . CHOLECYSTECTOMY  1998    SOCIAL HISTORY:   Social History   Tobacco Use  . Smoking status: Current Every Day Smoker    Packs/day: 1.00    Types: Cigarettes  . Smokeless tobacco:  Never Used  Substance Use Topics  . Alcohol use: No    FAMILY HISTORY:   Family History  Problem Relation Age of Onset  . Hypertension Mother   . Hyperlipidemia Mother   . Heart disease Father   . Heart disease Maternal Grandfather     DRUG ALLERGIES:   Allergies  Allergen Reactions  . Naproxen Rash    MEDICATIONS AT HOME:   Prior to Admission medications   Medication Sig Start Date End Date Taking? Authorizing Provider  albuterol (PROVENTIL HFA;VENTOLIN HFA) 108 (90 Base) MCG/ACT inhaler Inhale 1-2 puffs every 4 (four) hours as needed into the lungs for wheezing or shortness of breath.   Yes [provider]  atorvastatin (LIPITOR) 80 MG tablet Take 80 mg daily by mouth.   Yes [provider]  carvedilol (COREG) 25 MG tablet Take 25 mg by mouth 2 (two) times daily with a meal.   Yes [provider]  fluticasone (FLOVENT HFA) 220 MCG/ACT inhaler Inhale 2 (two) times daily into the lungs.   Yes [provider]  gabapentin (NEURONTIN) 300 MG capsule Take 600 mg by mouth 3 (three) times daily. 11/13/13  Yes [provider]  insulin aspart (NOVOLOG) 100 unit/mL injection Inject 10 Units into the vein 3 (three) times daily before meals. 02/28/16  Yes [name], MD  insulin glargine (LANTUS) 100  UNIT/ML injection Inject at bedtime into the skin.    Yes [provider]  levothyroxine (SYNTHROID, LEVOTHROID) 50 MCG tablet Take 50 mcg daily before breakfast by mouth.   Yes [provider]  linagliptin (TRADJENTA) 5 MG TABS tablet Take 5 mg daily by mouth.   Yes [provider]  losartan (COZAAR) 50 MG tablet Take 50 mg daily by mouth.   Yes [provider]  sertraline (ZOLOFT) 100 MG tablet Take 25 mg by mouth daily.  04/10/13  Yes [provider]  torsemide (DEMADEX) 20 MG tablet Take 1 tablet (20 mg total) by mouth 2 (two) times daily. 04/23/16 12/29/16 Yes Hackney, [name], FNP  Vitamin D,  Ergocalciferol, (DRISDOL) 50000 units CAPS capsule Take 50,000 Units by mouth every 7 (seven) days.   Yes [provider]  aspirin EC 81 MG EC tablet Take 1 tablet (81 mg total) by mouth daily. 02/29/16   [name], MD  cyclobenzaprine (FLEXERIL) 5 MG tablet Take 5 mg by mouth at bedtime.    [provider]  pioglitazone (ACTOS) 45 MG tablet Take by mouth.    [provider]  Tiotropium Bromide-Olodaterol (STIOLTO RESPIMAT) 2.5-2.5 MCG/ACT AERS Inhale into the lungs.    [provider]    REVIEW OF SYSTEMS:  Review of Systems  Unable to perform ROS: Acuity of condition     VITAL SIGNS:   Vitals:   12/29/16 1800 12/29/16 1830 12/29/16 1900 12/29/16 1930  BP: 113/66 120/69 (!) 113/98 (!) 121/109  Pulse: 69 67 74 69  Resp: 14 13 15 15   SpO2: 93% 98% 99% 98%  Weight:      Height:       Wt Readings from Last 3 Encounters:  12/29/16 (!) 154.2 kg (340 lb)  11/29/16 (!) 155.7 kg (343 lb 4 oz)  10/14/16 (!) 154.8 kg (341 lb 4 oz)    PHYSICAL EXAMINATION:  Physical Exam  Vitals reviewed. Constitutional: He appears well-developed and well-nourished. No distress.  HENT:  Head: Normocephalic and atraumatic.  Mouth/Throat: Oropharynx is clear and moist.  Eyes: Conjunctivae and EOM are normal. Pupils are equal, round, and reactive to light. No scleral icterus.  Neck: Normal range of motion. Neck supple. No JVD present. No thyromegaly present.  Cardiovascular: Normal rate, regular rhythm and intact distal pulses. Exam reveals no gallop and no friction rub.  No murmur heard. Respiratory: Effort normal and breath sounds normal. No respiratory distress. He has no wheezes. He has no rales.  GI: Soft. Bowel sounds are normal. He exhibits no distension. There is no tenderness.  Musculoskeletal: Normal range of motion. He exhibits no edema.  No arthritis, no gout  Lymphadenopathy:    He has no cervical adenopathy.  Neurological:  Unable to fully assess due  to patient condition.  Patient groans and somewhat localizes to pain, but is not responding otherwise  Skin: Skin is warm and dry. No rash noted. No erythema.  Psychiatric:  Unable to assess due to patient condition    LABORATORY PANEL:   CBC Recent Labs  Lab 12/29/16 1604  WBC 10.4  HGB 9.9*  HCT 30.5*  PLT 207   ------------------------------------------------------------------------------------------------------------------  Chemistries  Recent Labs  Lab 12/29/16 1604  NA 139  K 4.7  CL 109  CO2 18*  GLUCOSE 189*  BUN 71*  CREATININE 4.40*  CALCIUM 8.4*   ------------------------------------------------------------------------------------------------------------------  Cardiac Enzymes Recent Labs  Lab 12/29/16 1604  TROPONINI 0.84*   ------------------------------------------------------------------------------------------------------------------  RADIOLOGY:  Dg  Chest Portable 1 View  Result Date: 12/29/2016 CLINICAL DATA:  Initial evaluation for acute shortness of breath. History of CHF, COPD, hypertension. EXAM: PORTABLE CHEST 1 VIEW COMPARISON:  Prior radiograph from 02/26/2016. FINDINGS: Moderate cardiomegaly, stable. Apparent widening of the mediastinum favored to be related to AP technique, shallow lung inflation, and lordotic angulation. Lungs normally inflated. Diffuse pulmonary vascular and interstitial congestion without frank pulmonary edema. Underlying COPD. No focal infiltrates. No appreciable pleural effusion. Mild right basilar subsegmental atelectasis. No pneumothorax. No acute osseus abnormality. IMPRESSION: 1. Cardiomegaly with mild diffuse pulmonary interstitial congestion without frank pulmonary edema. 2. Mild right basilar atelectasis. Electronically Signed   By: Jeannine Boga M.D.   On: 12/29/2016 17:14   Dg Hip Unilat W Or Wo Pelvis 2-3 Views Left  Result Date: 12/29/2016 CLINICAL DATA:  Initial evaluation for multiple recent falls,  pain. EXAM: DG HIP (WITH OR WITHOUT PELVIS) 2-3V LEFT COMPARISON:  Prior radiograph from 07/12/2016. FINDINGS: Left hip in slight internal rotation. No acute fracture dislocation. Femoral head in normal alignment with the acetabulum. Heterotopic calcification adjacent to the greater trochanter is stable. Bony pelvis intact. SI joints approximated. Mild degenerative osteoarthrosis. Degenerative changes noted within lower lumbar spine. No acute soft tissue abnormality.  Atherosclerosis. IMPRESSION: 1. No acute osseous abnormality about the left hip. 2. Mild degenerative osteoarthrosis. Electronically Signed   By: Jeannine Boga M.D.   On: 12/29/2016 17:12   Dg Hip Unilat  With Pelvis 2-3 Views Right  Result Date: 12/29/2016 CLINICAL DATA:  Initial evaluation for multiple recent falls, acute right hip pain. EXAM: DG HIP (WITH OR WITHOUT PELVIS) 2-3V RIGHT COMPARISON:  None. FINDINGS: No acute fracture or dislocation. Femoral head in normal alignment with the acetabulum. Femoral head height preserved. Bony pelvis intact. SI joints approximated. Mild degenerative osteoarthrosis about the hips bilaterally. Degenerative changes noted within lower lumbar spine. No acute soft tissue abnormality.  Atherosclerosis noted. IMPRESSION: 1. No acute osseous abnormality about the right hip. 2. Mild degenerative osteoarthrosis. Electronically Signed   By: Jeannine Boga M.D.   On: 12/29/2016 17:10    EKG:   Orders placed or performed during the hospital encounter of 12/29/16  . ED EKG  . ED EKG  . Repeat EKG  . Repeat EKG  . EKG 12-Lead  . EKG 12-Lead  . EKG 12-Lead  . EKG 12-Lead    IMPRESSION AND PLAN:  Principal Problem:   Acute respiratory failure with hypercapnia (HCC) -patient currently on BiPAP with stable oxygenation.  Admit to ICU for respiratory support and further treatment. Active Problems:   COPD (chronic obstructive pulmonary disease) (HCC) -do not strongly suspect COPD exacerbation  as the patient has not had progressive symptoms, does not have significant wheezing.  Suspect his COPD may be playing a factor in his CO2 retention, along with likely obesity hypoventilation syndrome and sleep apnea.  Continue home inhalers, BiPAP as above   Acute on chronic diastolic CHF (congestive heart failure) ([place]) -patient has some vascular congestion on chest x-ray, but does not have frank pulmonary edema and his lungs are relatively clear.  However his BNP is elevated and his troponin is up as well.  Suspect troponin may be due to demand ischemia, but it is high enough at 0.84 that we are placing him on a heparin drip.  He has not had any complaints of cardiac symptoms.  Continue home meds, cycle cardiac enzymes, get an echocardiogram and cardiology consult   Acute on chronic renal failure -unclear etiology leading  to his acute renal injury and he does have chronic renal disease, perhaps he is having cardiorenal component worsening his creatinine.  We will get a nephrology consult   HTN (hypertension) -continue home meds   DM (diabetes mellitus) type II controlled with renal manifestation (HCC) -sliding scale insulin with corresponding glucose checks   Obstructive sleep apnea -BiPAP   HLD (hyperlipidemia) -home dose statin  All the records are reviewed and case discussed with ED provider. Management plans discussed with the patient and/or family.  DVT PROPHYLAXIS: Systemic anticoagulation  GI PROPHYLAXIS: None  ADMISSION STATUS: Inpatient  CODE STATUS: Full Code Status History    Date Active Date Inactive Code Status Order ID Comments User Context   02/27/2016 00:23 02/28/2016 16:06 Full Code [id number]  Harvie Bridge, DO Inpatient   02/08/2016 00:28 02/09/2016 18:20 Full Code [id number]  Laverle Hobby, MD ED   01/06/2015 00:09 01/08/2015 21:38 Full Code [id number]  Hubbard Robinson, MD ED      TOTAL CRITICAL CARE TIME TAKING CARE OF THIS PATIENT: 50 minutes.   [name],  Cimone Fahey [place] 12/29/2016, 9:20 PM  [employer]  [phone]  CC: Primary care physician; Lorelee Market, MD  Note:  This document was prepared using Dragon voice recognition software and may include unintentional dictation errors.

## 2016-12-29 NOTE — ED Notes (Signed)
While Dr. Cherylann Banas at bedside, pt noted to be lethargic and difficulty to arouse. Dr. Cherylann Banas sternal rubbed patient to awaken patient. When this RN and Sherri, RN at bedside to collect VBG, pt noted to awaken with less painful stimuli, pt awakens with a tap to the chest at this time.

## 2016-12-29 NOTE — ED Notes (Signed)
Date and time results received: 12/29/16 1826   Test: Trop  Critical Value: 0.84  Name of Provider Notified: Dr. Cherylann Banas  Orders Received? Or Actions Taken?: Critical Results Acknowledged

## 2016-12-29 NOTE — ED Notes (Signed)
Family at bedside. 

## 2016-12-29 NOTE — ED Notes (Signed)
IV team in with pt. Multiple attempts by other team members to start IV unsuccessful

## 2016-12-29 NOTE — ED Notes (Signed)
Respiratory called to place bipap on patient.

## 2016-12-29 NOTE — ED Notes (Signed)
Family member at bedside.

## 2016-12-29 NOTE — Consult Note (Signed)
Name: Brian [name] MRN: [id number] DOB: 1961-02-19    ADMISSION DATE:  12/29/2016  CONSULTATION DATE:  12/29/16  REFERRING MD :  Dr.Willis  CHIEF COMPLAINT: Fall/weakness  BRIEF PATIENT DESCRIPTION: 56 year old male with Acute on Chronic Hypoxic/Hypercarbic respiratory failure related to CHF exacerbation Vs Cardiorenal Syndrome.  SIGNIFICANT EVENTS  11/7 Patient admitted to the SDU with Acute Hypoxic/Hypercarbic respiratory failure related to CHF exacerbation Requiring BiPAP  STUDIES:  02/09/16 ECHO>>Left ventricle: The cavity size was normal. There was mild   concentric hypertrophy. Systolic function was normal. The  estimated ejection fraction was in the range of 55% to 60%   HISTORY OF PRESENT ILLNESS: Brian [name] is a 56 year old male with Known history of CHF,COPD, DM,Hypertension,Morbid obesity,OSA and acute on chronic kidney disease.  Patient [facility] to [facility] on [facility]/7 with complaints of progressive weakness and shortness of breath.  His BNP was 547 and troponins were elevated . Patient denies any chest pain However his VBG was concerning for CO2 retention 7.05/64/54/17.7.  Patient was placed on BiPAP and was sent to the SDU for further management.  PAST MEDICAL HISTORY :   has a past medical history of CHF (congestive heart failure) ([place]), COPD (chronic obstructive pulmonary disease) ([place]), Diabetes mellitus type 2 in obese ([place]), DM (diabetes mellitus) type II controlled with renal manifestation ([place]), Hypercholesteremia, Hypertension, Morbid obesity with BMI of 45.0-49.9, adult ([place]), Neuropathy, Pancreatitis, acute, Pneumonia, and Spinal stenosis.  has a past surgical history that includes Amputation; Cholecystectomy (1998); and APPENDECTOMY LAPAROSCOPIC drainage of peritoneal abcess (N/A, 01/06/2015). Prior to Admission medications   Medication Sig Start Date End Date Taking? Authorizing Provider  albuterol (PROVENTIL HFA;VENTOLIN HFA) 108 (90 Base) MCG/ACT inhaler Inhale  1-2 puffs every 4 (four) hours as needed into the lungs for wheezing or shortness of breath.   Yes [provider]  atorvastatin (LIPITOR) 80 MG tablet Take 80 mg daily by mouth.   Yes [provider]  carvedilol (COREG) 25 MG tablet Take 25 mg by mouth 2 (two) times daily with a meal.   Yes [provider]  fluticasone (FLOVENT HFA) 220 MCG/ACT inhaler Inhale 2 (two) times daily into the lungs.   Yes [provider]  gabapentin (NEURONTIN) 300 MG capsule Take 600 mg by mouth 3 (three) times daily. 11/13/13  Yes [provider]  insulin aspart (NOVOLOG) 100 unit/mL injection Inject 10 Units into the vein 3 (three) times daily before meals. 02/28/16  Yes [name], MD  insulin glargine (LANTUS) 100 UNIT/ML injection Inject at bedtime into the skin.    Yes [provider]  levothyroxine (SYNTHROID, LEVOTHROID) 50 MCG tablet Take 50 mcg daily before breakfast by mouth.   Yes [provider]  linagliptin (TRADJENTA) 5 MG TABS tablet Take 5 mg daily by mouth.   Yes [provider]  losartan (COZAAR) 50 MG tablet Take 50 mg daily by mouth.   Yes [provider]  sertraline (ZOLOFT) 100 MG tablet Take 25 mg by mouth daily.  04/10/13  Yes [provider]  torsemide (DEMADEX) 20 MG tablet Take 1 tablet (20 mg total) by mouth 2 (two) times daily. 04/23/16 12/29/16 Yes Hackney, [name], FNP  Vitamin D, Ergocalciferol, (DRISDOL) 50000 units CAPS capsule Take 50,000 Units by mouth every 7 (seven) days.   Yes [provider]  aspirin EC 81 MG EC tablet Take 1 tablet (81 mg total) by mouth daily. 02/29/16   [name], MD  cyclobenzaprine (FLEXERIL) 5  MG tablet Take 5 mg by mouth at bedtime.    [provider]  pioglitazone (ACTOS) 45 MG tablet Take by mouth.    [provider]  Tiotropium Bromide-Olodaterol (STIOLTO RESPIMAT) 2.5-2.5 MCG/ACT AERS Inhale into the lungs.    [provider]    Allergies  Allergen Reactions  . Naproxen Rash    FAMILY HISTORY:  family history includes Heart disease in his father and maternal grandfather; Hyperlipidemia in his mother; Hypertension in his mother. SOCIAL HISTORY:  reports that he has been smoking cigarettes.  He has been smoking about 1.00 pack per day. he has never used smokeless tobacco. He reports that he does not drink alcohol or use drugs.  SUBJECTIVE: Patient is on Bipap states "that he is not short of breath aas much as before"  VITAL SIGNS: Pulse Rate:  [65-74] 67 (11/07 2230) Resp:  [13-17] 15 (11/07 1930) BP: (90-121)/(30-109) 90/35 (11/07 2230) SpO2:  [81 %-99 %] 98 % (11/07 2230) Weight:  [340 lb (154.2 kg)] 340 lb (154.2 kg) (11/07 1606)  PHYSICAL EXAMINATION: General:  Morbidly obese,Caucasian male, on Bipap Neuro:  Awake,alert and oriented HEENT:  Atraumatic,normocephalic,no jvd Cardiovascular:  S1S2,regular,no m/r/g Lungs:  Diminished , no wheezes,crackles and rhonchi Abdomen:  Soft,NT,ND Musculoskeletal:  Amputated left toe,+2 pitting edema Skin: Open areas left foot  Recent Labs  Lab 12/29/16 1604  NA 139  K 4.7  CL 109  CO2 18*  BUN 71*  CREATININE 4.40*  GLUCOSE 189*   Recent Labs  Lab 12/29/16 1604  HGB 9.9*  HCT 30.5*  WBC 10.4  PLT 207   Dg Chest Portable 1 View  Result Date: 12/29/2016 CLINICAL DATA:  Initial evaluation for acute shortness of breath. History of CHF, COPD, hypertension. EXAM: PORTABLE CHEST 1 VIEW COMPARISON:  Prior radiograph from 02/26/2016. FINDINGS: Moderate cardiomegaly, stable. Apparent widening of the mediastinum favored to be related to AP technique, shallow lung inflation, and lordotic angulation. Lungs normally inflated. Diffuse pulmonary vascular and interstitial congestion without frank pulmonary edema. Underlying COPD. No focal infiltrates. No appreciable pleural effusion. Mild right basilar subsegmental atelectasis. No pneumothorax. No acute osseus  abnormality. IMPRESSION: 1. Cardiomegaly with mild diffuse pulmonary interstitial congestion without frank pulmonary edema. 2. Mild right basilar atelectasis. Electronically Signed   By: Jeannine Boga M.D.   On: 12/29/2016 17:14   Dg Hip Unilat W Or Wo Pelvis 2-3 Views Left  Result Date: 12/29/2016 CLINICAL DATA:  Initial evaluation for multiple recent falls, pain. EXAM: DG HIP (WITH OR WITHOUT PELVIS) 2-3V LEFT COMPARISON:  Prior radiograph from 07/12/2016. FINDINGS: Left hip in slight internal rotation. No acute fracture dislocation. Femoral head in normal alignment with the acetabulum. Heterotopic calcification adjacent to the greater trochanter is stable. Bony pelvis intact. SI joints approximated. Mild degenerative osteoarthrosis. Degenerative changes noted within lower lumbar spine. No acute soft tissue abnormality.  Atherosclerosis. IMPRESSION: 1. No acute osseous abnormality about the left hip. 2. Mild degenerative osteoarthrosis. Electronically Signed   By: Jeannine Boga M.D.   On: 12/29/2016 17:12   Dg Hip Unilat  With Pelvis 2-3 Views Right  Result Date: 12/29/2016 CLINICAL DATA:  Initial evaluation for multiple recent falls, acute right hip pain. EXAM: DG HIP (WITH OR WITHOUT PELVIS) 2-3V RIGHT COMPARISON:  None. FINDINGS: No acute fracture or dislocation. Femoral head in normal alignment with the acetabulum. Femoral head height preserved. Bony pelvis intact. SI joints approximated. Mild degenerative osteoarthrosis about the hips bilaterally. Degenerative changes noted within lower lumbar spine. No acute  soft tissue abnormality.  Atherosclerosis noted. IMPRESSION: 1. No acute osseous abnormality about the right hip. 2. Mild degenerative osteoarthrosis. Electronically Signed   By: Jeannine Boga M.D.   On: 12/29/2016 17:10    ASSESSMENT / PLAN:  -Acute On Chronic hypoxic /hypercarbic respiratory failure related to CHF exacerbation -COPD -Elevated troponin due to demand  ischemia -Cardio Renal Syndrome -Obesity Hypoventilation syndrome -OSA -Hypertension -Hyperlipidemia -Hypothyroidism   Plan Continue Bipap, wean as tolerated Continue Bronchodilators Trend troponins F/U echo Cardiology consulted Blood glucose checks with SSI coverage Continue Aspirin Continue Atorvastatin Nephrology consulted for worsening renal function Continue Coreg Continue Synhroid    Analycia Khokhar,AG-ACNP Pulmonary [facility] [place]   12/29/2016, 10:52 PM

## 2016-12-29 NOTE — ED Notes (Signed)
Pt returned from scans via stretcher.  

## 2016-12-29 NOTE — ED Notes (Signed)
MD at bedside at this time to discuss with patient's family the results of tests.

## 2016-12-29 NOTE — ED Notes (Signed)
Pt's family at bedside at this time, requesting an update, explained that MD had been to bedside and seen patient, would notify MD of family request for update. MD notified at this time.

## 2016-12-29 NOTE — Progress Notes (Signed)
ANTICOAGULATION CONSULT NOTE - Initial Consult  Pharmacy Consult for heparin gtt Indication: chest pain/ACS  Allergies  Allergen Reactions  . Naproxen Rash    Patient Measurements: Height: 5\' 10"  (177.8 cm) Weight: (!) 340 lb (154.2 kg) IBW/kg (Calculated) : 73 Heparin Dosing Weight: 110.1kg  Vital Signs: BP: 121/109 (11/07 1930) Pulse Rate: 69 (11/07 1930)  Labs: Recent Labs    12/29/16 1604  HGB 9.9*  HCT 30.5*  PLT 207  CREATININE 4.40*  TROPONINI 0.84*    Estimated Creatinine Clearance: 27.6 mL/min (A) (by C-G formula based on SCr of 4.4 mg/dL (H)).   Medical History: Past Medical History:  Diagnosis Date  . CHF (congestive heart failure) ([place])   . COPD (chronic obstructive pulmonary disease) ([place])   . Diabetes mellitus type 2 in obese ([place])   . DM (diabetes mellitus) type II controlled with renal manifestation ([place])   . Hypercholesteremia   . Hypertension   . Morbid obesity with BMI of 45.0-49.9, adult ([place])   . Neuropathy   . Pancreatitis, acute   . Pneumonia   . Spinal stenosis     Medications:   (Not in a hospital admission) Scheduled:  . heparin  4,000 Units Intravenous Once   Infusions:  . heparin     PRN:  Anti-infectives (From admission, onward)   None      Assessment: 56 year old male presents with NSTEMI pharmacy consulted for heparin management per protocol.  Goal of Therapy:  Heparin level 0.3-0.7 units/ml Monitor platelets by anticoagulation protocol: Yes   Plan:  Give 4000 units bolus x 1 Start heparin infusion at 1000 units/hr Check anti-Xa level in 6 hours and daily while on heparin Continue to monitor H&H and platelets  Donna Christen Felipe [place] 12/29/2016,8:18 PM

## 2016-12-29 NOTE — ED Notes (Signed)
Dr [name] notified that pt is now hypotensive. New orders received for fluid bolus.

## 2016-12-30 ENCOUNTER — Inpatient Hospital Stay
Admit: 2016-12-30 | Discharge: 2016-12-30 | Disposition: A | Discharge status: Home / Self Care | Payer: Medicaid Other | Attending: Internal Medicine | Admitting: Internal Medicine

## 2016-12-30 LAB — CBC
HEMATOCRIT: 31.7 % — AB (ref 40.0–52.0)
HEMATOCRIT: 31.5 % — AB (ref 40.0–52.0)
HEMOGLOBIN: 10.3 g/dL — AB (ref 13.0–18.0)
Hemoglobin: 10.3 g/dL — ABNORMAL LOW (ref 13.0–18.0)
MCH: 29.8 pg (ref 26.0–34.0)
MCH: 30 pg (ref 26.0–34.0)
MCHC: 32.4 g/dL (ref 32.0–36.0)
MCHC: 32.5 g/dL (ref 32.0–36.0)
MCV: 91.9 fL (ref 80.0–100.0)
MCV: 92.3 fL (ref 80.0–100.0)
Platelets: 214 10*3/uL (ref 150–440)
Platelets: 194 10*3/uL (ref 150–440)
RBC: 3.45 MIL/uL — ABNORMAL LOW (ref 4.40–5.90)
RBC: 3.42 MIL/uL — ABNORMAL LOW (ref 4.40–5.90)
RDW: 15.6 % — ABNORMAL HIGH (ref 11.5–14.5)
RDW: 15.4 % — AB (ref 11.5–14.5)
WBC: 12 10*3/uL — ABNORMAL HIGH (ref 3.8–10.6)
WBC: 12.2 10*3/uL — ABNORMAL HIGH (ref 3.8–10.6)

## 2016-12-30 LAB — BASIC METABOLIC PANEL
Anion gap: 11 (ref 5–15)
BUN: 75 mg/dL — AB (ref 6–20)
CO2: 17 mmol/L — ABNORMAL LOW (ref 22–32)
Calcium: 8.2 mg/dL — ABNORMAL LOW (ref 8.9–10.3)
Chloride: 109 mmol/L (ref 101–111)
Creatinine, Ser: 4.73 mg/dL — ABNORMAL HIGH (ref 0.61–1.24)
GFR calc Af Amer: 14 mL/min — ABNORMAL LOW (ref >60)
GFR, EST NON AFRICAN AMERICAN: 12 mL/min — AB (ref >60)
GLUCOSE: 183 mg/dL — AB (ref 65–99)
POTASSIUM: 5 mmol/L (ref 3.5–5.1)
Sodium: 137 mmol/L (ref 135–145)

## 2016-12-30 LAB — TROPONIN I
TROPONIN I: 1.84 ng/mL — AB (ref <0.03)
Troponin I: 1.43 ng/mL (ref <0.03)
Troponin I: 1.74 ng/mL (ref <0.03)

## 2016-12-30 LAB — CREATININE, SERUM
CREATININE: 4.5 mg/dL — AB (ref 0.61–1.24)
GFR calc Af Amer: 15 mL/min — ABNORMAL LOW (ref >60)
GFR calc non Af Amer: 13 mL/min — ABNORMAL LOW (ref >60)

## 2016-12-30 LAB — GLUCOSE, CAPILLARY
GLUCOSE-CAPILLARY: 162 mg/dL — AB (ref 65–99)
GLUCOSE-CAPILLARY: 132 mg/dL — AB (ref 65–99)
GLUCOSE-CAPILLARY: 175 mg/dL — AB (ref 65–99)
Glucose-Capillary: 159 mg/dL — ABNORMAL HIGH (ref 65–99)
Glucose-Capillary: 178 mg/dL — ABNORMAL HIGH (ref 65–99)
Glucose-Capillary: 287 mg/dL — ABNORMAL HIGH (ref 65–99)

## 2016-12-30 LAB — MRSA PCR SCREENING: MRSA by PCR: NEGATIVE

## 2016-12-30 LAB — HEPARIN LEVEL (UNFRACTIONATED): HEPARIN UNFRACTIONATED: 0.12 [IU]/mL — AB (ref 0.30–0.70)

## 2016-12-30 MED ORDER — TORSEMIDE 20 MG PO TABS: 20.0000 mg | ORAL_TABLET | Freq: Two times a day (BID) | ORAL | Status: DC

## 2016-12-30 MED ORDER — INSULIN ASPART 100 UNIT/ML ~~LOC~~ SOLN: 0.0000 [IU] | Freq: Every day | SUBCUTANEOUS | Status: DC

## 2016-12-30 MED ORDER — FUROSEMIDE 10 MG/ML IJ SOLN
8.0000 mg/h | INTRAVENOUS | Status: DC
  Administered 2016-12-30: 8 mg/h via INTRAVENOUS
  Filled 2016-12-30: qty 25

## 2016-12-30 MED ORDER — ONDANSETRON HCL 4 MG PO TABS: 4.0000 mg | ORAL_TABLET | Freq: Four times a day (QID) | ORAL | Status: DC | PRN

## 2016-12-30 MED ORDER — ONDANSETRON HCL 4 MG/2ML IJ SOLN: 4.0000 mg | Freq: Four times a day (QID) | INTRAMUSCULAR | Status: DC | PRN

## 2016-12-30 MED ORDER — HEPARIN SODIUM (PORCINE) 5000 UNIT/ML IJ SOLN
5000.0000 [IU] | Freq: Three times a day (TID) | INTRAMUSCULAR | Status: DC
  Administered 2016-12-30 – 2017-01-06 (×17): 5000 [IU] via SUBCUTANEOUS
  Filled 2016-12-30 (×17): qty 1

## 2016-12-30 MED ORDER — ARFORMOTEROL TARTRATE 15 MCG/2ML IN NEBU
15.0000 ug | INHALATION_SOLUTION | Freq: Two times a day (BID) | RESPIRATORY_TRACT | Status: DC
  Administered 2016-12-30 – 2017-01-06 (×13): 15 ug via RESPIRATORY_TRACT
  Filled 2016-12-30 (×18): qty 2

## 2016-12-30 MED ORDER — INSULIN ASPART 100 UNIT/ML ~~LOC~~ SOLN
5.0000 [IU] | Freq: Three times a day (TID) | SUBCUTANEOUS | Status: DC
  Administered 2016-12-30 – 2016-12-31 (×4): 5 [IU] via SUBCUTANEOUS
  Filled 2016-12-30 (×4): qty 1

## 2016-12-30 MED ORDER — UMECLIDINIUM BROMIDE 62.5 MCG/INH IN AEPB
1.0000 | INHALATION_SPRAY | Freq: Every day | RESPIRATORY_TRACT | Status: DC
  Administered 2016-12-31 – 2017-01-06 (×6): 1 via RESPIRATORY_TRACT
  Filled 2016-12-30: qty 7

## 2016-12-30 MED ORDER — ACETAMINOPHEN 650 MG RE SUPP: 650.0000 mg | Freq: Four times a day (QID) | RECTAL | Status: DC | PRN

## 2016-12-30 MED ORDER — SERTRALINE HCL 50 MG PO TABS
25.0000 mg | ORAL_TABLET | Freq: Every day | ORAL | Status: DC
  Administered 2016-12-30 – 2017-01-06 (×7): 25 mg via ORAL
  Filled 2016-12-30 (×7): qty 1

## 2016-12-30 MED ORDER — LEVOTHYROXINE SODIUM 50 MCG PO TABS
50.0000 ug | ORAL_TABLET | Freq: Every day | ORAL | Status: DC
  Administered 2016-12-31 – 2017-01-06 (×6): 50 ug via ORAL
  Filled 2016-12-30 (×6): qty 1

## 2016-12-30 MED ORDER — GABAPENTIN 300 MG PO CAPS
600.0000 mg | ORAL_CAPSULE | Freq: Three times a day (TID) | ORAL | Status: DC
  Administered 2016-12-30 – 2017-01-02 (×9): 600 mg via ORAL
  Filled 2016-12-30 (×9): qty 2

## 2016-12-30 MED ORDER — ORAL CARE MOUTH RINSE
15.0000 mL | Freq: Two times a day (BID) | OROMUCOSAL | Status: DC
Start: 2016-12-31 — End: 2017-01-06
  Administered 2017-01-02 (×2): 15 mL via OROMUCOSAL

## 2016-12-30 MED ORDER — INSULIN ASPART 100 UNIT/ML ~~LOC~~ SOLN
0.0000 [IU] | SUBCUTANEOUS | Status: DC
  Administered 2016-12-30: 2 [IU] via SUBCUTANEOUS
  Administered 2016-12-30: 1 [IU] via SUBCUTANEOUS
  Administered 2016-12-30: 5 [IU] via SUBCUTANEOUS
  Filled 2016-12-30 (×3): qty 1

## 2016-12-30 MED ORDER — CARVEDILOL 25 MG PO TABS
25.0000 mg | ORAL_TABLET | Freq: Two times a day (BID) | ORAL | Status: DC
  Administered 2016-12-30 – 2017-01-06 (×10): 25 mg via ORAL
  Filled 2016-12-30 (×6): qty 1
  Filled 2016-12-30: qty 2
  Filled 2016-12-30 (×4): qty 1

## 2016-12-30 MED ORDER — CHLORHEXIDINE GLUCONATE 0.12 % MT SOLN
15.0000 mL | Freq: Two times a day (BID) | OROMUCOSAL | Status: DC
Start: 2016-12-30 — End: 2017-01-06
  Administered 2016-12-30 – 2017-01-06 (×10): 15 mL via OROMUCOSAL
  Filled 2016-12-30 (×9): qty 15

## 2016-12-30 MED ORDER — INFLUENZA VAC SPLIT QUAD 0.5 ML IM SUSY: 0.5000 mL | PREFILLED_SYRINGE | INTRAMUSCULAR | Status: DC

## 2016-12-30 MED ORDER — HEPARIN SODIUM (PORCINE) 5000 UNIT/ML IJ SOLN: 5000.0000 [IU] | Freq: Three times a day (TID) | INTRAMUSCULAR | Status: DC

## 2016-12-30 MED ORDER — IPRATROPIUM-ALBUTEROL 0.5-2.5 (3) MG/3ML IN SOLN: 3.0000 mL | RESPIRATORY_TRACT | Status: DC | PRN

## 2016-12-30 MED ORDER — ATORVASTATIN CALCIUM 20 MG PO TABS
80.0000 mg | ORAL_TABLET | Freq: Every day | ORAL | Status: DC
  Administered 2016-12-30 – 2017-01-05 (×6): 80 mg via ORAL
  Filled 2016-12-30 (×7): qty 4

## 2016-12-30 MED ORDER — FUROSEMIDE 10 MG/ML IJ SOLN
80.0000 mg | Freq: Once | INTRAMUSCULAR | Status: AC
  Administered 2016-12-30: 80 mg via INTRAVENOUS
  Filled 2016-12-30: qty 8

## 2016-12-30 MED ORDER — INSULIN ASPART 100 UNIT/ML ~~LOC~~ SOLN
0.0000 [IU] | Freq: Three times a day (TID) | SUBCUTANEOUS | Status: DC
Start: 2016-12-30 — End: 2016-12-31
  Administered 2016-12-30 – 2016-12-31 (×4): 2 [IU] via SUBCUTANEOUS
  Filled 2016-12-30 (×4): qty 1

## 2016-12-30 MED ORDER — HEPARIN BOLUS VIA INFUSION
3000.0000 [IU] | Freq: Once | INTRAVENOUS | Status: AC
  Administered 2016-12-30: 3000 [IU] via INTRAVENOUS
  Filled 2016-12-30: qty 3000

## 2016-12-30 MED ORDER — ASPIRIN EC 81 MG PO TBEC
81.0000 mg | DELAYED_RELEASE_TABLET | Freq: Every day | ORAL | Status: DC
  Administered 2016-12-30 – 2017-01-06 (×7): 81 mg via ORAL
  Filled 2016-12-30 (×7): qty 1

## 2016-12-30 MED ORDER — ACETAMINOPHEN 325 MG PO TABS
650.0000 mg | ORAL_TABLET | Freq: Four times a day (QID) | ORAL | Status: DC | PRN
  Administered 2017-01-01 – 2017-01-06 (×2): 650 mg via ORAL
  Filled 2016-12-30 (×2): qty 2

## 2016-12-30 NOTE — Progress Notes (Signed)
[place] [facility]                                                                                                                                                                                  Patient Demographics   Mathieu Schloemer, is a 56 y.o. male, DOB - 04/02/1960, ZTI:[id number]  Admit date - 12/29/2016   Admitting Physician Lance Coon, MD  Outpatient Primary MD for the patient is Lorelee Market, MD   LOS - 1  Subjective: Patient admitted with shortness of breath and had to be placed on BiPAP. Remains on BiPAP   Review of Systems:   CONSTITUTIONAL: No documented fever. No fatigue, weakness. No weight gain, no weight loss.  EYES: No blurry or double vision.  ENT: No tinnitus. No postnasal drip. No redness of the oropharynx.  RESPIRATORY: No cough, no wheeze, no hemoptysis.  Positive dyspnea.  CARDIOVASCULAR: No chest pain. No orthopnea. No palpitations. No syncope.  GASTROINTESTINAL: No nausea, no vomiting or diarrhea. No abdominal pain. No melena or hematochezia.  GENITOURINARY: No dysuria or hematuria.  ENDOCRINE: No polyuria or nocturia. No heat or cold intolerance.  HEMATOLOGY: No anemia. No bruising. No bleeding.  INTEGUMENTARY: No rashes. No lesions.  MUSCULOSKELETAL: No arthritis. No swelling. No gout.  NEUROLOGIC: No numbness, tingling, or ataxia. No seizure-type activity.  PSYCHIATRIC: No anxiety. No insomnia. No ADD.    Vitals:   Vitals:   12/30/16 0301 12/30/16 0400 12/30/16 0500 12/30/16 0600  BP:  (!) 147/78 (!) 149/79 (!) 129/112  Pulse:  76 83 85  Resp:  (!) 9 19 (!) 25  Temp:      TempSrc:      SpO2:  96% 100% 98%  Weight: (!) 361 lb 8.9 oz (164 kg)     Height:        Wt Readings from Last 3 Encounters:  12/30/16 (!) 361 lb 8.9 oz (164 kg)  11/29/16 (!) 343 lb 4 oz (155.7 kg)  10/14/16 (!) 341 lb 4 oz (154.8 kg)     Intake/Output Summary (Last 24 hours) at 12/30/2016 1508 Last data filed at 12/30/2016  0600 Gross per 24 hour  Intake 92.1 ml  Output -  Net 92.1 ml    Physical Exam:   GENERAL: Pleasant-appearing in no apparent distress.  HEAD, EYES, EARS, NOSE AND THROAT: Atraumatic, normocephalic. Extraocular muscles are intact. Pupils equal and reactive to light. Sclerae anicteric. No conjunctival injection. No oro-pharyngeal erythema.  NECK: Supple. There is no jugular venous distention. No bruits, no lymphadenopathy, no thyromegaly.  HEART: Regular rate and rhythm,. No murmurs, no rubs, no clicks.  LUNGS: Clear to auscultation bilaterally. No rales  or rhonchi. No wheezes.  ABDOMEN: Soft, flat, nontender, nondistended. Has good bowel sounds. No hepatosplenomegaly appreciated.  EXTREMITIES: No evidence of any cyanosis, clubbing, or 2+ pitting peripheral edema.  +2 pedal and radial pulses bilaterally.  NEUROLOGIC: The patient is alert, awake, and oriented x3 with no focal motor or sensory deficits appreciated bilaterally.  SKIN: Moist and warm with no rashes appreciated.  Psych: Not anxious, depressed LN: No inguinal LN enlargement    Antibiotics   Anti-infectives (From admission, onward)   None      Medications   Scheduled Meds: . arformoterol  15 mcg Nebulization BID  . aspirin EC  81 mg Oral Daily  . atorvastatin  80 mg Oral Daily  . carvedilol  25 mg Oral BID WC  . heparin subcutaneous  5,000 Units Subcutaneous Q8H  . insulin aspart  0-9 Units Subcutaneous Q4H  . levothyroxine  50 mcg Oral QAC breakfast  . sertraline  25 mg Oral Daily  . umeclidinium bromide  1 puff Inhalation Daily   Continuous Infusions: . furosemide (LASIX) infusion 8 mg/hr (12/30/16 1132)   PRN Meds:.acetaminophen **OR** acetaminophen, ipratropium-albuterol, ondansetron **OR** ondansetron (ZOFRAN) IV   Data Review:   Micro Results Recent Results (from the past 240 hour(s))  MRSA PCR Screening     Status: None   Collection Time: 12/30/16 12:36 AM  Result Value Ref Range Status   MRSA by  PCR NEGATIVE NEGATIVE Final    Comment:        The GeneXpert MRSA Assay (FDA approved for NASAL specimens only), is one component of a comprehensive MRSA colonization surveillance program. It is not intended to diagnose MRSA infection nor to guide or monitor treatment for MRSA infections.     Radiology Reports Dg Chest Portable 1 View  Result Date: 12/29/2016 CLINICAL DATA:  Initial evaluation for acute shortness of breath. History of CHF, COPD, hypertension. EXAM: PORTABLE CHEST 1 VIEW COMPARISON:  Prior radiograph from 02/26/2016. FINDINGS: Moderate cardiomegaly, stable. Apparent widening of the mediastinum favored to be related to AP technique, shallow lung inflation, and lordotic angulation. Lungs normally inflated. Diffuse pulmonary vascular and interstitial congestion without frank pulmonary edema. Underlying COPD. No focal infiltrates. No appreciable pleural effusion. Mild right basilar subsegmental atelectasis. No pneumothorax. No acute osseus abnormality. IMPRESSION: 1. Cardiomegaly with mild diffuse pulmonary interstitial congestion without frank pulmonary edema. 2. Mild right basilar atelectasis. Electronically Signed   By: Jeannine Boga M.D.   On: 12/29/2016 17:14   Dg Hip Unilat W Or Wo Pelvis 2-3 Views Left  Result Date: 12/29/2016 CLINICAL DATA:  Initial evaluation for multiple recent falls, pain. EXAM: DG HIP (WITH OR WITHOUT PELVIS) 2-3V LEFT COMPARISON:  Prior radiograph from 07/12/2016. FINDINGS: Left hip in slight internal rotation. No acute fracture dislocation. Femoral head in normal alignment with the acetabulum. Heterotopic calcification adjacent to the greater trochanter is stable. Bony pelvis intact. SI joints approximated. Mild degenerative osteoarthrosis. Degenerative changes noted within lower lumbar spine. No acute soft tissue abnormality.  Atherosclerosis. IMPRESSION: 1. No acute osseous abnormality about the left hip. 2. Mild degenerative osteoarthrosis.  Electronically Signed   By: Jeannine Boga M.D.   On: 12/29/2016 17:12   Dg Hip Unilat  With Pelvis 2-3 Views Right  Result Date: 12/29/2016 CLINICAL DATA:  Initial evaluation for multiple recent falls, acute right hip pain. EXAM: DG HIP (WITH OR WITHOUT PELVIS) 2-3V RIGHT COMPARISON:  None. FINDINGS: No acute fracture or dislocation. Femoral head in normal alignment with the acetabulum. Femoral head height  preserved. Bony pelvis intact. SI joints approximated. Mild degenerative osteoarthrosis about the hips bilaterally. Degenerative changes noted within lower lumbar spine. No acute soft tissue abnormality.  Atherosclerosis noted. IMPRESSION: 1. No acute osseous abnormality about the right hip. 2. Mild degenerative osteoarthrosis. Electronically Signed   By: Jeannine Boga M.D.   On: 12/29/2016 17:10     CBC Recent Labs  Lab 12/29/16 1604 12/30/16 0417 12/30/16 0705  WBC 10.4 12.0* 12.2*  HGB 9.9* 10.3* 10.3*  HCT 30.5* 31.7* 31.5*  PLT 207 214 194  MCV 92.9 91.9 92.3  MCH 30.1 29.8 30.0  MCHC 32.4 32.4 32.5  RDW 15.6* 15.6* 15.4*    Chemistries  Recent Labs  Lab 12/29/16 1604 12/30/16 0417 12/30/16 0705  NA 139  --  137  K 4.7  --  5.0  CL 109  --  109  CO2 18*  --  17*  GLUCOSE 189*  --  183*  BUN 71*  --  75*  CREATININE 4.40* 4.50* 4.73*  CALCIUM 8.4*  --  8.2*   ------------------------------------------------------------------------------------------------------------------ estimated creatinine clearance is 26.7 mL/min (A) (by C-G formula based on SCr of 4.73 mg/dL (H)). ------------------------------------------------------------------------------------------------------------------ No results for input(s): HGBA1C in the last 72 hours. ------------------------------------------------------------------------------------------------------------------ No results for input(s): CHOL, HDL, LDLCALC, TRIG, CHOLHDL, LDLDIRECT in the last 72  hours. ------------------------------------------------------------------------------------------------------------------ No results for input(s): TSH, T4TOTAL, T3FREE, THYROIDAB in the last 72 hours.  Invalid input(s): FREET3 ------------------------------------------------------------------------------------------------------------------ No results for input(s): VITAMINB12, FOLATE, FERRITIN, TIBC, IRON, RETICCTPCT in the last 72 hours.  Coagulation profile Recent Labs  Lab 12/29/16 2031  INR 1.09    No results for input(s): DDIMER in the last 72 hours.  Cardiac Enzymes Recent Labs  Lab 12/30/16 0152 12/30/16 0705 12/30/16 1307  TROPONINI 1.43* 1.74* 1.84*   ------------------------------------------------------------------------------------------------------------------ Invalid input(s): POCBNP    Assessment & Plan  Patient is a 56 year old presenting with shortness of breath    1. Acute respiratory failure with hypercapnia (HCC) -patient currently on BiPAP This is related to acute on chronic diastolic CHF Seen by nephrology he will be started on Lasix drip  2. COPD (chronic obstructive pulmonary disease) (HCC) -does not appear to be COPD exasperation Continue supportive care and home inhalers, as needed nebs  3.  Elevated troponin possibly due to demand ischemia Has been seen by cardiology Await echocardiogram of the heart Heparin has been discontinued Treated with aspirin  4.  Acute on chronic renal failure This is due to hepatorenal syndrome IV Lasix drip  5.  Essential hypertension Continue Coreg  6.  Hypothyroidism continue Synthroid  7.  Depression continue Zoloft           Code Status Orders  (From admission, onward)        Start     Ordered   12/30/16 0043  Full code  Continuous     12/30/16 0042    Code Status History    Date Active Date Inactive Code Status Order ID Comments User Context   02/27/2016 00:23 02/28/2016 16:06 Full Code  [id number]  Harvie Bridge, DO Inpatient   02/08/2016 00:28 02/09/2016 18:20 Full Code [id number]  Laverle Hobby, MD ED   01/06/2015 00:09 01/08/2015 21:38 Full Code [id number]  Hubbard Robinson, MD ED           Consults  intensivist  DVT Prophylaxis  Lovenox   Lab Results  Component Value Date   PLT 194 12/30/2016     Time Spent in minutes  71min Greater than  50% of time spent in care coordination and counseling patient regarding the condition and plan of care.   Dustin Flock M.D on 12/30/2016 at 3:08 PM  Between 7am to 6pm - Pager - [phone]  After 6pm go to [email or web address] - password [email or web address] [facility]   Office  [phone]

## 2016-12-30 NOTE — Progress Notes (Signed)
  [facility]  ROUNDING NOTE   Subjective:   Mr. BEHR CISLO admitted to [facility] on 12/29/2016 for Acidosis [E87.2] Generalized weakness [R53.1] COPD with respiratory failure, acute ([place]) [J96.00, J44.9] Contusion of right hip, initial encounter [S70.01XA]  Patient in ICU. Placed on BIPAP. States he has gained close to 50 pounds in last few months. Has been getting weaker.   Creatinine 4.73  Objective:  Vital signs:   Physical Exam: General: Critically ill  Head: BIPAP  Eyes: Anicteric, PERRL  Neck: obese  Lungs:  Diminished bilaterally  Heart: Regular rate and rhythm  Abdomen:  Soft, nontender, obese  Extremities: ++ peripheral edema.  Neurologic: Nonfocal, moving all four extremities  Skin: No lesions       Basic Metabolic Panel: Recent Labs  Lab 12/29/16 1604 12/30/16 0417 12/30/16 0705  NA 139  --  137  K 4.7  --  5.0  CL 109  --  109  CO2 18*  --  17*  GLUCOSE 189*  --  183*  BUN 71*  --  75*  CREATININE 4.40* 4.50* 4.73*  CALCIUM 8.4*  --  8.2*    Liver Function Tests: No results for input(s): AST, ALT, ALKPHOS, BILITOT, PROT, ALBUMIN in the last 168 hours. No results for input(s): LIPASE, AMYLASE in the last 168 hours. No results for input(s): AMMONIA in the last 168 hours.  CBC: Recent Labs  Lab 12/29/16 1604 12/30/16 0417 12/30/16 0705  WBC 10.4 12.0* 12.2*  HGB 9.9* 10.3* 10.3*  HCT 30.5* 31.7* 31.5*  MCV 92.9 91.9 92.3  PLT 207 214 194    Microbiology: Results for orders placed or performed during the hospital encounter of 12/29/16  MRSA PCR Screening     Status: None   Collection Time: 12/30/16 12:36 AM  Result Value Ref Range Status   MRSA by PCR NEGATIVE NEGATIVE Final    Comment:        The GeneXpert MRSA Assay (FDA approved for NASAL specimens only), is one component of a comprehensive MRSA colonization surveillance program. It is not intended to diagnose MRSA infection nor to guide or monitor treatment  for MRSA infections.      Assessment/ Plan:  KHARSON RASMUSSON is a  56 y.o. white male  with diabetes mellitus type II insulin dependent, diabetic peripheral neuropathy, right toe amputation, hypertension, hyperlipidemia, hypogonadism, COPD/tobacco use, depression   1. Acute Renal Failure on chronic kidney disease stage IV with proteinuria and hyperkalemia: baseline creatinine 2.55, GFR of 26 on 10/14 Nephrotic range proteinuria Chronic kidney disease secondary to diabetes and hypertension. Unable to tolerate ACE-I/ARB in past due to hyperkalemia Acute renal failure is concerning for acute cardiorenal syndrome.  - Place foley catheter for accurate urine output  2. Hypotension with systolic congestive heart failure: echo 01/2016. New echocardiogram pending - change PO torsemide to IV furosemide gtt.   3. Acute exacerbation of COPD: with hypercarbic hypoxic respiratory failure. Placed on noninvasive mechanical ventilation.    LOS: [place], [place] 11/8/20189:33 AM

## 2016-12-30 NOTE — Progress Notes (Signed)
*  PRELIMINARY RESULTS* Echocardiogram 2D Echocardiogram has been performed.  Brian [name] 12/30/2016, 3:21 PM

## 2016-12-30 NOTE — Progress Notes (Signed)
Inpatient Diabetes Program Recommendations  AACE/ADA: New Consensus Statement on Inpatient Glycemic Control (2015)  Target Ranges:  Prepandial:   less than 140 mg/dL      Peak postprandial:   less than 180 mg/dL (1-2 hours)      Critically ill patients:  140 - 180 mg/dL   Lab Results  Component Value Date   GLUCAP 162 (H) 12/30/2016   HGBA1C 11.5 (H) 02/27/2016    Review of Glycemic Control  Results for KEVONTAE, BURGOON (MRN [id number]) as of 12/30/2016 11:49  Ref. Range 12/30/2016 00:36 12/30/2016 04:04 12/30/2016 07:33  Glucose-Capillary Latest Ref Range: 65 - 99 mg/dL 178 (H) 287 (H) 162 (H)    Diabetes history: Type 2 Outpatient Diabetes medications: Novolog 10 units tid with meals, Lantus 50 units at 8am and Lantus 35 units at 2pm Current orders for Inpatient glycemic control: Novolog 0-9 units tid  Inpatient Diabetes Program Recommendations:  Since patient will begin to eat, consider adding Novolog 5 units tid with meals. Continue Novolog correction 0-9 units tid as ordered. Consider adding Novolog 0-5 units qhs.  Spoke with patient at the bedside- he tells me he is a patient of Dr. Brunetta Genera- he was ordered Lantus 50 units bid but was having low blood sugars at 3:30pm a few days and MD told him to cut the evening insulin back to 35units in the evening. Patient was told to take it bid but not specifically at 8am and 2pm.  At discharge, I would recommend Lantus insulin bid at 8am and 8pm- I did discuss this with the patient.  He confirms he takes Novolog 10 units tid with meals.  Gentry Fitz, RN, BA, MHA, CDE Diabetes Coordinator Inpatient Diabetes Program  [phone] (Team Pager) [phone] ([place]) 12/30/2016 1:03 PM

## 2016-12-30 NOTE — Progress Notes (Signed)
ANTICOAGULATION CONSULT NOTE - Initial Consult  Pharmacy Consult for heparin gtt Indication: chest pain/ACS  Allergies  Allergen Reactions  . Naproxen Rash    Patient Measurements: Height: 5\' 10"  (177.8 cm) Weight: (!) 361 lb 8.9 oz (164 kg) IBW/kg (Calculated) : 73 Heparin Dosing Weight: 110.1kg  Vital Signs: Temp: 97.8 F (36.6 C) (11/08 0100) Temp Source: Oral (11/08 0100) BP: 147/78 (11/08 0400) Pulse Rate: 76 (11/08 0400)  Labs: Recent Labs    12/29/16 1604 12/29/16 2031 12/30/16 0152 12/30/16 0417  HGB 9.9*  --   --  10.3*  HCT 30.5*  --   --  31.7*  PLT 207  --   --  214  APTT  --  33  --   --   LABPROT  --  14.0  --   --   INR  --  1.09  --   --   HEPARINUNFRC  --   --   --  0.12*  CREATININE 4.40*  --   --  4.50*  TROPONINI 0.84*  --  1.43*  --     Estimated Creatinine Clearance: 28 mL/min (A) (by C-G formula based on SCr of 4.5 mg/dL (H)).   Medical History: Past Medical History:  Diagnosis Date  . CHF (congestive heart failure) ([place])   . COPD (chronic obstructive pulmonary disease) ([place])   . Diabetes mellitus type 2 in obese ([place])   . DM (diabetes mellitus) type II controlled with renal manifestation ([place])   . Hypercholesteremia   . Hypertension   . Morbid obesity with BMI of 45.0-49.9, adult ([place])   . Neuropathy   . Pancreatitis, acute   . Pneumonia   . Spinal stenosis     Medications:  Medications Prior to Admission  Medication Sig Dispense Refill Last Dose  . albuterol (PROVENTIL HFA;VENTOLIN HFA) 108 (90 Base) MCG/ACT inhaler Inhale 1-2 puffs every 4 (four) hours as needed into the lungs for wheezing or shortness of breath.   unknown at unknown  . atorvastatin (LIPITOR) 80 MG tablet Take 80 mg daily by mouth.   unknown at unknown  . carvedilol (COREG) 25 MG tablet Take 25 mg by mouth 2 (two) times daily with a meal.   unknown at unknown  . fluticasone (FLOVENT HFA) 220 MCG/ACT inhaler Inhale 2 (two) times daily into the lungs.   unknown  at unknown  . gabapentin (NEURONTIN) 300 MG capsule Take 600 mg by mouth 3 (three) times daily.   unknown at unknown  . insulin aspart (NOVOLOG) 100 unit/mL injection Inject 10 Units into the vein 3 (three) times daily before meals. 1 vial 12 unknown at unknown  . insulin glargine (LANTUS) 100 UNIT/ML injection Inject at bedtime into the skin.    unknown at unknown  . levothyroxine (SYNTHROID, LEVOTHROID) 50 MCG tablet Take 50 mcg daily before breakfast by mouth.   unknown at unknown  . linagliptin (TRADJENTA) 5 MG TABS tablet Take 5 mg daily by mouth.   unknown at unknown  . losartan (COZAAR) 50 MG tablet Take 50 mg daily by mouth.   unknown at unknown  . sertraline (ZOLOFT) 100 MG tablet Take 25 mg by mouth daily.    unknown at unknown  . torsemide (DEMADEX) 20 MG tablet Take 1 tablet (20 mg total) by mouth 2 (two) times daily. 60 tablet 3 unknown at unknown  . Vitamin D, Ergocalciferol, (DRISDOL) 50000 units CAPS capsule Take 50,000 Units by mouth every 7 (seven) days.   unknown at unknown  .  aspirin EC 81 MG EC tablet Take 1 tablet (81 mg total) by mouth daily. 30 tablet 0 unknown at unknown  . cyclobenzaprine (FLEXERIL) 5 MG tablet Take 5 mg by mouth at bedtime.   unknown at unknown  . pioglitazone (ACTOS) 45 MG tablet Take by mouth.   unknown at unknown  . Tiotropium Bromide-Olodaterol (STIOLTO RESPIMAT) 2.5-2.5 MCG/ACT AERS Inhale into the lungs.   unknown at unknown   Scheduled:  . arformoterol  15 mcg Nebulization BID  . aspirin EC  81 mg Oral Daily  . atorvastatin  80 mg Oral Daily  . carvedilol  25 mg Oral BID WC  . heparin  3,000 Units Intravenous Once  . insulin aspart  0-9 Units Subcutaneous Q4H  . levothyroxine  50 mcg Oral QAC breakfast  . sertraline  25 mg Oral Daily  . torsemide  20 mg Oral BID  . umeclidinium bromide  1 puff Inhalation Daily   Infusions:  . heparin 1,000 Units/hr (12/30/16 0300)   PRN:  Anti-infectives (From admission, onward)   None       Assessment: 56 year old male presents with NSTEMI pharmacy consulted for heparin management per protocol.  Goal of Therapy:  Heparin level 0.3-0.7 units/ml Monitor platelets by anticoagulation protocol: Yes   Plan:  Give 4000 units bolus x 1 Start heparin infusion at 1000 units/hr Check anti-Xa level in 6 hours and daily while on heparin Continue to monitor H&H and platelets   11/8 @ 0400 HL 0.12 subtherapeutic. Will rebolus w/ heparin 3000 units IV x 1 and increase rate to 1300 units/hr, will recheck HL @ 1130. Will monitor CBC w/ am labs.  Tobie Lords, PharmD, BCPS Clinical Pharmacist 12/30/2016

## 2016-12-30 NOTE — Progress Notes (Signed)
eLink Physician-Brief Progress Note Patient Name: KAVAUGHN FAUCETT DOB: 1960-10-13 MRN: [id number]   Date of Service  12/30/2016  HPI/Events of Note   56 year old M with history of CHF,COPD, DM,Hypertension,Morbid obesity,OSA and acute on chronic kidney disease.  Patient presented to [facility] with progressive weakness and SOB.  His BNP was 547 and troponins were elevated . Patient denies any chest pain However his VBG was concerning for CO2 retention 2.99/24/26/83.4 and metabolic acidosis.  Of note his renal function has declined from prior measurements.  Patient was placed on BiPAP and was sent to the SDU for further management.  On camera check the patient is resting comfortably on BiPAP.  RR is 14 with sats of 98%.  He is HD stable with BP of 150/66 (84) and HR of 70.    eICU Interventions  Plan of care per primary admitting team Cardiology and Nephrology to be consulted during this stay. F/U on ordered studies Continue to monitor via [facility]     Intervention Category Evaluation Type: New Patient Evaluation  Emmanuella Mirante 12/30/2016, 2:01 AM

## 2016-12-30 NOTE — Consult Note (Signed)
[facility] Cardiology  CARDIOLOGY CONSULT NOTE  Patient ID: Brian [name] MRN: [id number] DOB/AGE: 57-Sep-1962 57 y.o.  Admit date: 12/29/2016 Referring Physician [name] Primary Physician [place] Primary Cardiologist [name] Reason for Consultation congestive heart failure, elevated troponin  HPI: 56 year old gentleman referred for evaluation of congestive heart failure and elevated troponin.  The patient has a history of chronic diastolic congestive heart failure with underlying history of COPD, chronic kidney disease, morbid obesity and diabetes.  The patient presents with a 2-week history of progressive weakness, recurrent falls.  In the emergency room the patient was noted to be hypoxic, and lethargic and was admitted to ICU where he was treated with BiPAP.  Admission labs were notable for elevated troponin I 0.43 and 1.74 in the absence of chest pain.  EKG was nondiagnostic.  BUN and creatinine were elevated to 75 and 4.73, respectively.  Review of systems complete and found to be negative unless listed above     Past Medical History:  Diagnosis Date  . CHF (congestive heart failure) ([place])   . COPD (chronic obstructive pulmonary disease) ([place])   . Diabetes mellitus type 2 in obese ([place])   . DM (diabetes mellitus) type II controlled with renal manifestation ([place])   . Hypercholesteremia   . Hypertension   . Morbid obesity with BMI of 45.0-49.9, adult ([place])   . Neuropathy   . Pancreatitis, acute   . Pneumonia   . Spinal stenosis     Past Surgical History:  Procedure Laterality Date  . AMPUTATION    . CHOLECYSTECTOMY  1998    Medications Prior to Admission  Medication Sig Dispense Refill Last Dose  . albuterol (PROVENTIL HFA;VENTOLIN HFA) 108 (90 Base) MCG/ACT inhaler Inhale 1-2 puffs every 4 (four) hours as needed into the lungs for wheezing or shortness of breath.   unknown at unknown  . atorvastatin (LIPITOR) 80 MG tablet Take 80 mg daily by mouth.   unknown at unknown  .  carvedilol (COREG) 25 MG tablet Take 25 mg by mouth 2 (two) times daily with a meal.   unknown at unknown  . fluticasone (FLOVENT HFA) 220 MCG/ACT inhaler Inhale 2 (two) times daily into the lungs.   unknown at unknown  . gabapentin (NEURONTIN) 300 MG capsule Take 600 mg by mouth 3 (three) times daily.   unknown at unknown  . insulin aspart (NOVOLOG) 100 unit/mL injection Inject 10 Units into the vein 3 (three) times daily before meals. 1 vial 12 unknown at unknown  . insulin glargine (LANTUS) 100 UNIT/ML injection Inject at bedtime into the skin.    unknown at unknown  . levothyroxine (SYNTHROID, LEVOTHROID) 50 MCG tablet Take 50 mcg daily before breakfast by mouth.   unknown at unknown  . linagliptin (TRADJENTA) 5 MG TABS tablet Take 5 mg daily by mouth.   unknown at unknown  . losartan (COZAAR) 50 MG tablet Take 50 mg daily by mouth.   unknown at unknown  . sertraline (ZOLOFT) 100 MG tablet Take 25 mg by mouth daily.    unknown at unknown  . torsemide (DEMADEX) 20 MG tablet Take 1 tablet (20 mg total) by mouth 2 (two) times daily. 60 tablet 3 unknown at unknown  . Vitamin D, Ergocalciferol, (DRISDOL) 50000 units CAPS capsule Take 50,000 Units by mouth every 7 (seven) days.   unknown at unknown  . aspirin EC 81 MG EC tablet Take 1 tablet (81 mg total) by mouth daily. 30 tablet 0 unknown at unknown  . cyclobenzaprine (FLEXERIL)  5 MG tablet Take 5 mg by mouth at bedtime.   unknown at unknown  . pioglitazone (ACTOS) 45 MG tablet Take by mouth.   unknown at unknown  . Tiotropium Bromide-Olodaterol (STIOLTO RESPIMAT) 2.5-2.5 MCG/ACT AERS Inhale into the lungs.   unknown at unknown   Social History   Socioeconomic History  . Marital status: Married    Spouse name: Not on file  . Number of children: Not on file  . Years of education: Not on file  . Highest education level: Not on file  Social Needs  . Financial resource strain: Not on file  . Food insecurity - worry: Not on file  . Food  insecurity - inability: Not on file  . Transportation needs - medical: Not on file  . Transportation needs - non-medical: Not on file  Occupational History  . Not on file  Tobacco Use  . Smoking status: Current Every Day Smoker    Packs/day: 1.00    Types: Cigarettes  . Smokeless tobacco: Never Used  Substance and Sexual Activity  . Alcohol use: No  . Drug use: No  . Sexual activity: Not on file  Other Topics Concern  . Not on file  Social History Narrative  . Not on file    Family History  Problem Relation Age of Onset  . Hypertension Mother   . Hyperlipidemia Mother   . Heart disease Father   . Heart disease Maternal Grandfather       Review of systems complete and found to be negative unless listed above      PHYSICAL EXAM  General: Well developed, well nourished, in no acute distress HEENT:  Normocephalic and atramatic Neck:  No JVD.  Lungs: Clear bilaterally to auscultation and percussion. Heart: HRRR . Normal S1 and S2 without gallops or murmurs.  Abdomen: Bowel sounds are positive, abdomen soft and non-tender  Msk:  Back normal, normal gait. Normal strength and tone for age. Extremities: No clubbing, cyanosis or edema.   Neuro: Alert and oriented X 3. Psych:  Good affect, responds appropriately  Labs:   Lab Results  Component Value Date   WBC 12.2 (H) 12/30/2016   HGB 10.3 (L) 12/30/2016   HCT 31.5 (L) 12/30/2016   MCV 92.3 12/30/2016   PLT 194 12/30/2016    Recent Labs  Lab 12/30/16 0705  NA 137  K 5.0  CL 109  CO2 17*  BUN 75*  CREATININE 4.73*  CALCIUM 8.2*  GLUCOSE 183*   Lab Results  Component Value Date   TROPONINI 1.74 (HH) 12/30/2016    Lab Results  Component Value Date   CHOL 175 02/27/2016   CHOL 132 08/24/2012   Lab Results  Component Value Date   HDL 60 02/27/2016   HDL 33 (L) 08/24/2012   Lab Results  Component Value Date   LDLCALC 77 02/27/2016   LDLCALC 49 08/24/2012   Lab Results  Component Value Date    TRIG 191 (H) 02/27/2016   TRIG 251 (H) 08/24/2012   Lab Results  Component Value Date   CHOLHDL 2.9 02/27/2016   No results found for: LDLDIRECT    Radiology: Dg Chest Portable 1 View  Result Date: 12/29/2016 CLINICAL DATA:  Initial evaluation for acute shortness of breath. History of CHF, COPD, hypertension. EXAM: PORTABLE CHEST 1 VIEW COMPARISON:  Prior radiograph from 02/26/2016. FINDINGS: Moderate cardiomegaly, stable. Apparent widening of the mediastinum favored to be related to AP technique, shallow lung inflation, and lordotic angulation. Lungs normally  inflated. Diffuse pulmonary vascular and interstitial congestion without frank pulmonary edema. Underlying COPD. No focal infiltrates. No appreciable pleural effusion. Mild right basilar subsegmental atelectasis. No pneumothorax. No acute osseus abnormality. IMPRESSION: 1. Cardiomegaly with mild diffuse pulmonary interstitial congestion without frank pulmonary edema. 2. Mild right basilar atelectasis. Electronically Signed   By: Jeannine Boga M.D.   On: 12/29/2016 17:14   Dg Hip Unilat W Or Wo Pelvis 2-3 Views Left  Result Date: 12/29/2016 CLINICAL DATA:  Initial evaluation for multiple recent falls, pain. EXAM: DG HIP (WITH OR WITHOUT PELVIS) 2-3V LEFT COMPARISON:  Prior radiograph from 07/12/2016. FINDINGS: Left hip in slight internal rotation. No acute fracture dislocation. Femoral head in normal alignment with the acetabulum. Heterotopic calcification adjacent to the greater trochanter is stable. Bony pelvis intact. SI joints approximated. Mild degenerative osteoarthrosis. Degenerative changes noted within lower lumbar spine. No acute soft tissue abnormality.  Atherosclerosis. IMPRESSION: 1. No acute osseous abnormality about the left hip. 2. Mild degenerative osteoarthrosis. Electronically Signed   By: Jeannine Boga M.D.   On: 12/29/2016 17:12   Dg Hip Unilat  With Pelvis 2-3 Views Right  Result Date: 12/29/2016 CLINICAL  DATA:  Initial evaluation for multiple recent falls, acute right hip pain. EXAM: DG HIP (WITH OR WITHOUT PELVIS) 2-3V RIGHT COMPARISON:  None. FINDINGS: No acute fracture or dislocation. Femoral head in normal alignment with the acetabulum. Femoral head height preserved. Bony pelvis intact. SI joints approximated. Mild degenerative osteoarthrosis about the hips bilaterally. Degenerative changes noted within lower lumbar spine. No acute soft tissue abnormality.  Atherosclerosis noted. IMPRESSION: 1. No acute osseous abnormality about the right hip. 2. Mild degenerative osteoarthrosis. Electronically Signed   By: Jeannine Boga M.D.   On: 12/29/2016 17:10    EKG: Normal sinus rhythm, nonspecific T wave abnormalities  ASSESSMENT AND PLAN:   1.  Elevated troponin, in the absence of chest pain, or new ECG changes, in the setting of lethargy, acute on chronic renal failure, likely demand supply ischemia, not due to acute coronary syndrome 2.  Respiratory failure, multifactorial, with acute on chronic diastolic congestive heart failure, acute on chronic renal failure, and underlying COPD 3.  Acute on chronic renal failure with BUN and creatinine at 75 and 4.73  Recommendations  1.  DC heparin drip 2.  Await nephrology consult 3.  Defer further cardiac diagnostics at this time  Signed: Isaias Cowman MD,PhD, [facility] 12/30/2016, 8:06 AM

## 2016-12-30 NOTE — Progress Notes (Signed)
MEDICATION RELATED CONSULT NOTE - ELECTROLYTE   Pharmacy Consult for electrolytes Labs: Sodium (mmol/L)  Date Value  12/30/2016 137  11/05/2012 136   Potassium (mmol/L)  Date Value  12/30/2016 5.0  11/05/2012 3.7   Magnesium (mg/dL)  Date Value  02/08/2016 1.9   Calcium (mg/dL)  Date Value  12/30/2016 8.2 (L)   Calcium, Total (mg/dL)  Date Value  11/05/2012 8.3 (L)   Albumin (g/dL)  Date Value  02/26/2016 2.9 (L)  11/03/2012 2.5 (L)    Estimated Creatinine Clearance: 26.7 mL/min (A) (by C-G formula based on SCr of 4.73 mg/dL (H)).  Assessment: 56 yo male admitted with acute respiratory failure. Pharmacy has been consulted to monitor and adjust electrolytes.   Plan:  Patient's electrolytes are within normal limits and no changes are required at this time. Pharmacy will continue to monitor and adjust as needed.   Lendon Ka, PharmD Pharmacy Resident 12/30/2016,12:32 PM

## 2016-12-31 ENCOUNTER — Inpatient Hospital Stay: Payer: Medicaid Other

## 2016-12-31 DIAGNOSIS — G4733 Obstructive sleep apnea (adult) (pediatric): Secondary | ICD-10-CM

## 2016-12-31 DIAGNOSIS — J9602 Acute respiratory failure with hypercapnia: Secondary | ICD-10-CM

## 2016-12-31 DIAGNOSIS — J449 Chronic obstructive pulmonary disease, unspecified: Secondary | ICD-10-CM

## 2016-12-31 LAB — BLOOD GAS, ARTERIAL
ACID-BASE DEFICIT: 12.9 mmol/L — AB (ref 0.0–2.0)
BICARBONATE: 14.6 mmol/L — AB (ref 20.0–28.0)
FIO2: 0.21
O2 SAT: 73.7 %
PCO2 ART: 39 mmHg (ref 32.0–48.0)
PH ART: 7.18 — AB (ref 7.350–7.450)
Patient temperature: 37
pO2, Arterial: 50 mmHg — ABNORMAL LOW (ref 83.0–108.0)

## 2016-12-31 LAB — GLUCOSE, CAPILLARY
GLUCOSE-CAPILLARY: 162 mg/dL — AB (ref 65–99)
Glucose-Capillary: 177 mg/dL — ABNORMAL HIGH (ref 65–99)
Glucose-Capillary: 159 mg/dL — ABNORMAL HIGH (ref 65–99)
Glucose-Capillary: 154 mg/dL — ABNORMAL HIGH (ref 65–99)
Glucose-Capillary: 131 mg/dL — ABNORMAL HIGH (ref 65–99)
Glucose-Capillary: 146 mg/dL — ABNORMAL HIGH (ref 65–99)

## 2016-12-31 LAB — COMPREHENSIVE METABOLIC PANEL
ALT: 29 U/L (ref 17–63)
AST: 15 U/L (ref 15–41)
Albumin: 2.7 g/dL — ABNORMAL LOW (ref 3.5–5.0)
Alkaline Phosphatase: 126 U/L (ref 38–126)
Anion gap: 12 (ref 5–15)
BUN: 92 mg/dL — AB (ref 6–20)
CALCIUM: 7.3 mg/dL — AB (ref 8.9–10.3)
CHLORIDE: 106 mmol/L (ref 101–111)
CO2: 18 mmol/L — ABNORMAL LOW (ref 22–32)
CREATININE: 5.41 mg/dL — AB (ref 0.61–1.24)
GFR calc Af Amer: 12 mL/min — ABNORMAL LOW (ref >60)
GFR, EST NON AFRICAN AMERICAN: 11 mL/min — AB (ref >60)
GLUCOSE: 158 mg/dL — AB (ref 65–99)
POTASSIUM: 4.1 mmol/L (ref 3.5–5.1)
Sodium: 136 mmol/L (ref 135–145)
TOTAL PROTEIN: 6.7 g/dL (ref 6.5–8.1)
Total Bilirubin: 0.4 mg/dL (ref 0.3–1.2)

## 2016-12-31 LAB — BASIC METABOLIC PANEL
ANION GAP: 10 (ref 5–15)
BUN: 94 mg/dL — ABNORMAL HIGH (ref 6–20)
CALCIUM: 7.4 mg/dL — AB (ref 8.9–10.3)
CO2: 16 mmol/L — ABNORMAL LOW (ref 22–32)
CREATININE: 5.46 mg/dL — AB (ref 0.61–1.24)
Chloride: 109 mmol/L (ref 101–111)
GFR, EST AFRICAN AMERICAN: 12 mL/min — AB (ref >60)
GFR, EST NON AFRICAN AMERICAN: 10 mL/min — AB (ref >60)
GLUCOSE: 170 mg/dL — AB (ref 65–99)
Potassium: 4.3 mmol/L (ref 3.5–5.1)
Sodium: 135 mmol/L (ref 135–145)

## 2016-12-31 LAB — ECHOCARDIOGRAM COMPLETE
HEIGHTINCHES: 70 in
Weight: 5784.87 oz

## 2016-12-31 LAB — HIV ANTIBODY (ROUTINE TESTING W REFLEX): HIV SCREEN 4TH GENERATION: NONREACTIVE

## 2016-12-31 LAB — CBC
HCT: 27.4 % — ABNORMAL LOW (ref 40.0–52.0)
Hemoglobin: 9 g/dL — ABNORMAL LOW (ref 13.0–18.0)
MCH: 30 pg (ref 26.0–34.0)
MCHC: 32.8 g/dL (ref 32.0–36.0)
MCV: 91.4 fL (ref 80.0–100.0)
PLATELETS: 199 10*3/uL (ref 150–440)
RBC: 3 MIL/uL — ABNORMAL LOW (ref 4.40–5.90)
RDW: 15.5 % — AB (ref 11.5–14.5)
WBC: 9.8 10*3/uL (ref 3.8–10.6)

## 2016-12-31 LAB — PHOSPHORUS: PHOSPHORUS: 9 mg/dL — AB (ref 2.5–4.6)

## 2016-12-31 MED ORDER — METOLAZONE 5 MG PO TABS
25.0000 mg | ORAL_TABLET | Freq: Once | ORAL | Status: DC
  Filled 2016-12-31: qty 1

## 2016-12-31 MED ORDER — INSULIN ASPART 100 UNIT/ML ~~LOC~~ SOLN
0.0000 [IU] | SUBCUTANEOUS | Status: DC
  Administered 2017-01-01: 2 [IU] via SUBCUTANEOUS
  Administered 2017-01-01: 3 [IU] via SUBCUTANEOUS
  Administered 2017-01-01 (×3): 2 [IU] via SUBCUTANEOUS
  Administered 2017-01-02: 3 [IU] via SUBCUTANEOUS
  Administered 2017-01-02 (×4): 2 [IU] via SUBCUTANEOUS
  Administered 2017-01-04 – 2017-01-05 (×3): 3 [IU] via SUBCUTANEOUS
  Administered 2017-01-05 – 2017-01-06 (×7): 2 [IU] via SUBCUTANEOUS
  Filled 2016-12-31 (×20): qty 1

## 2016-12-31 MED ORDER — OXYCODONE-ACETAMINOPHEN 5-325 MG PO TABS
1.0000 | ORAL_TABLET | Freq: Four times a day (QID) | ORAL | Status: DC | PRN
  Administered 2016-12-31 – 2017-01-06 (×13): 1 via ORAL
  Filled 2016-12-31 (×13): qty 1

## 2016-12-31 MED ORDER — INFLUENZA VAC SPLIT QUAD 0.5 ML IM SUSY
0.5000 mL | PREFILLED_SYRINGE | INTRAMUSCULAR | Status: AC
  Administered 2017-01-04: 0.5 mL via INTRAMUSCULAR
  Filled 2016-12-31: qty 0.5

## 2016-12-31 MED ORDER — METOLAZONE 5 MG PO TABS
25.0000 mg | ORAL_TABLET | Freq: Once | ORAL | Status: AC
  Administered 2016-12-31: 25 mg via ORAL
  Filled 2016-12-31: qty 5

## 2016-12-31 MED ORDER — LIDOCAINE HCL 2 % IJ SOLN
5.0000 mL | Freq: Once | INTRAMUSCULAR | Status: AC
  Administered 2016-12-31: 100 mg via INTRADERMAL
  Filled 2016-12-31: qty 10

## 2016-12-31 MED ORDER — FUROSEMIDE 10 MG/ML IJ SOLN
80.0000 mg | Freq: Once | INTRAMUSCULAR | Status: AC
  Administered 2016-12-31: 80 mg via INTRAVENOUS
  Filled 2016-12-31: qty 8

## 2016-12-31 NOTE — Progress Notes (Signed)
This note also relates to the following rows which could not be included: Pulse Rate - Cannot attach notes to unvalidated device data Resp - Cannot attach notes to unvalidated device data BP - Cannot attach notes to unvalidated device data SpO2 - Cannot attach notes to unvalidated device data  HD STARTED  

## 2016-12-31 NOTE — Progress Notes (Signed)
MEDICATION RELATED CONSULT NOTE - ELECTROLYTE   Pharmacy Consult for electrolytes Labs: Sodium (mmol/L)  Date Value  12/31/2016 135  11/05/2012 136   Potassium (mmol/L)  Date Value  12/31/2016 4.3  11/05/2012 3.7   Magnesium (mg/dL)  Date Value  02/08/2016 1.9   Calcium (mg/dL)  Date Value  12/31/2016 7.4 (L)   Calcium, Total (mg/dL)  Date Value  11/05/2012 8.3 (L)   Albumin (g/dL)  Date Value  02/26/2016 2.9 (L)  11/03/2012 2.5 (L)    Estimated Creatinine Clearance: 23.1 mL/min (A) (by C-G formula based on SCr of 5.46 mg/dL (H)).  Assessment: 56 yo male admitted with acute respiratory failure. Pharmacy has been consulted to monitor and adjust electrolytes.   Plan:  Patient's electrolytes are within normal limits and no changes are required at this time. Patient is currently on lasix drip and electrolytes will continue to be monitored.  Lendon Ka, PharmD Pharmacy Resident 12/31/2016,12:40 PM

## 2016-12-31 NOTE — Consult Note (Signed)
   Name: CASHEL BELLINA MRN: [id number] DOB: 22-Feb-1961    ADMISSION DATE:  12/29/2016  CONSULTATION DATE:  12/29/16  REFERRING MD :  Dr.Willis  CHIEF COMPLAINT: Fall/weakness  BRIEF PATIENT DESCRIPTION: 56 year old male with Acute on Chronic Hypoxic/Hypercarbic respiratory failure related to CHF exacerbation Vs Cardiorenal Syndrome.  SIGNIFICANT EVENTS  11/7 Patient admitted to the SDU with Acute Hypoxic/Hypercarbic respiratory failure related to CHF exacerbation Requiring BiPAP  STUDIES:  02/09/16 ECHO>>Left ventricle: The cavity size was normal. There was mild   concentric hypertrophy. Systolic function was normal. The  estimated ejection fraction was in the range of 55% to 60%   HISTORY OF PRESENT ILLNESS:  Alert and awake, follows commands Patient non compliant with CPAP at home C/o back pain On lasix infusion  VITAL SIGNS: Temp:  [97.9 F (36.6 C)-98.7 F (37.1 C)] 98.7 F (37.1 C) (11/09 0400) Pulse Rate:  [72-91] 72 (11/09 0500) Resp:  [12-21] 17 (11/09 0500) BP: (101-152)/(50-117) 108/57 (11/09 0500) SpO2:  [89 %-100 %] 97 % (11/09 0500) FiO2 (%):  [30 %] 30 % (11/09 0400) Weight:  [361 lb 8.9 oz (164 kg)] 361 lb 8.9 oz (164 kg) (11/09 0509)  PHYSICAL EXAMINATION: General:  Morbidly obese,Caucasian male, on Bipap Neuro:  Awake,alert and oriented HEENT:  Atraumatic,normocephalic,no jvd Cardiovascular:  S1S2,regular,no m/r/g Lungs:  Diminished , no wheezes,crackles and rhonchi Abdomen:  Soft,NT,ND Musculoskeletal:  Amputated left toe,+2 pitting edema Skin: Open areas left foot  Recent Labs  Lab 12/29/16 1604 12/30/16 0417 12/30/16 0705 12/31/16 0534  NA 139  --  137 135  K 4.7  --  5.0 4.3  CL 109  --  109 109  CO2 18*  --  17* 16*  BUN 71*  --  75* 94*  CREATININE 4.40* 4.50* 4.73* 5.46*  GLUCOSE 189*  --  183* 170*   Recent Labs  Lab 12/29/16 1604 12/30/16 0417 12/30/16 0705  HGB 9.9* 10.3* 10.3*  HCT 30.5* 31.7* 31.5*  WBC 10.4 12.0*  12.2*  PLT 207 214 194    ASSESSMENT / PLAN:  56 year old morbidly obese white male with underlying sleep apnea admitted to the ICU for acute on chronic respiratory distress related to decompensated cardiorenal syndrome  -Acute On Chronic hypoxic /hypercarbic respiratory failure related to CHF exacerbation/renal exacerbation -COPD -Elevated troponin due to demand ischemia -Cardio Renal Syndrome decompensated -Obesity Hypoventilation syndrome -OSA -Hypertension -Hyperlipidemia -Hypothyroidism   Plan  Bipap, wean as tolerated Continue Bronchodilators Trend troponins F/U echo Cardiology consulted Blood glucose checks with SSI coverage Continue Aspirin Continue Atorvastatin Nephrology consulted for worsening renal function-on lasix infusion Continue Coreg Continue Synhroid   Corrin Parker, M.D.  [facility]  Medical Director [place] Director [place] Department

## 2016-12-31 NOTE — Progress Notes (Signed)
Pre dialysis assessment 

## 2016-12-31 NOTE — Procedures (Signed)
Hemodialysis Catheter Insertion Procedure Note Brian [name] [id number] 12-10-1960  Procedure: Insertion of Hemodialysis Catheter Indications: Dialysis Access   Procedure Details Consent: Risks of procedure as well as the alternatives and risks of each were explained to the (patient/caregiver).  Consent for procedure obtained. Time Out: Verified patient identification, verified procedure, site/side was marked, verified correct patient position, special equipment/implants available, medications/allergies/relevent history reviewed, required imaging and test results available.  Performed  Maximum sterile technique was used including antiseptics, cap, gloves, gown, hand hygiene, mask and sheet. Skin prep: Chlorhexidine; local anesthetic administered Double lumen hemodialysis catheter was inserted into right internal jugular vein using the Seldinger technique.  Evaluation Blood flow good Complications: No apparent complications Patient did tolerate procedure well. Chest X-ray ordered to verify placement.  CXR: pending.  Right Internal Jugular HD catheter placed utilizing ultrasound no complications noted during or following procedure.  [name], [place] Pager [phone] (please enter 7 digits) PCCM Consult Pager [phone] (please enter 7 digits)

## 2016-12-31 NOTE — Progress Notes (Signed)
[place] at [facility]                                                                                                                                                                                  Patient Demographics   Terry Bolotin, is a 56 y.o. male, DOB - 07-Jul-1960, TKW:[id number]  Admit date - 12/29/2016   Admitting Physician Lance Coon, MD  Outpatient Primary MD for the patient is Lorelee Market, MD   LOS - 2  Subjective: Patient shortness of breath improved however renal function worse  Review of Systems:   CONSTITUTIONAL: No documented fever. No fatigue, weakness. No weight gain, no weight loss.  EYES: No blurry or double vision.  ENT: No tinnitus. No postnasal drip. No redness of the oropharynx.  RESPIRATORY: No cough, no wheeze, no hemoptysis.  Positive dyspnea.  CARDIOVASCULAR: No chest pain. No orthopnea. No palpitations. No syncope.  GASTROINTESTINAL: No nausea, no vomiting or diarrhea. No abdominal pain. No melena or hematochezia.  GENITOURINARY: No dysuria or hematuria.  ENDOCRINE: No polyuria or nocturia. No heat or cold intolerance.  HEMATOLOGY: No anemia. No bruising. No bleeding.  INTEGUMENTARY: No rashes. No lesions.  MUSCULOSKELETAL: No arthritis. No swelling. No gout.  NEUROLOGIC: No numbness, tingling, or ataxia. No seizure-type activity.  PSYCHIATRIC: No anxiety. No insomnia. No ADD.    Vitals:   Vitals:   12/31/16 1010 12/31/16 1030 12/31/16 1100 12/31/16 1130  BP: (!) 86/53 (!) 101/56 115/65 (!) 106/56  Pulse: 63 62 63 64  Resp: 17 17 15 20   Temp:      TempSrc:      SpO2: 92% 91% 93% (!) 89%  Weight:      Height:        Wt Readings from Last 3 Encounters:  12/31/16 (!) 361 lb 8.9 oz (164 kg)  11/29/16 (!) 343 lb 4 oz (155.7 kg)  10/14/16 (!) 341 lb 4 oz (154.8 kg)     Intake/Output Summary (Last 24 hours) at 12/31/2016 1358 Last data filed at 12/31/2016 0400 Gross per 24 hour  Intake 131.73 ml   Output 1200 ml  Net -1068.27 ml    Physical Exam:   GENERAL: Pleasant-appearing in no apparent distress.  HEAD, EYES, EARS, NOSE AND THROAT: Atraumatic, normocephalic. Extraocular muscles are intact. Pupils equal and reactive to light. Sclerae anicteric. No conjunctival injection. No oro-pharyngeal erythema.  NECK: Supple. There is no jugular venous distention. No bruits, no lymphadenopathy, no thyromegaly.  HEART: Regular rate and rhythm,. No murmurs, no rubs, no clicks.  LUNGS: Clear to auscultation bilaterally. No rales or rhonchi. No wheezes.  ABDOMEN: Soft, flat, nontender, nondistended. Has good bowel sounds.  No hepatosplenomegaly appreciated.  EXTREMITIES: No evidence of any cyanosis, clubbing, or 2+ pitting peripheral edema.  +2 pedal and radial pulses bilaterally.  NEUROLOGIC: The patient is alert, awake, and oriented x3 with no focal motor or sensory deficits appreciated bilaterally.  SKIN: Moist and warm with no rashes appreciated.  Psych: Not anxious, depressed LN: No inguinal LN enlargement    Antibiotics   Anti-infectives (From admission, onward)   None      Medications   Scheduled Meds: . arformoterol  15 mcg Nebulization BID  . aspirin EC  81 mg Oral Daily  . atorvastatin  80 mg Oral Daily  . carvedilol  25 mg Oral BID WC  . chlorhexidine  15 mL Mouth Rinse BID  . gabapentin  600 mg Oral TID  . heparin subcutaneous  5,000 Units Subcutaneous Q8H  . [START ON 01/01/2017] Influenza vac split quadrivalent PF  0.5 mL Intramuscular Tomorrow-1000  . insulin aspart  0-5 Units Subcutaneous QHS  . insulin aspart  0-9 Units Subcutaneous TID WC  . insulin aspart  5 Units Subcutaneous TID WC  . levothyroxine  50 mcg Oral QAC breakfast  . lidocaine  5 mL Intradermal Once  . mouth rinse  15 mL Mouth Rinse q12n4p  . sertraline  25 mg Oral Daily  . umeclidinium bromide  1 puff Inhalation Daily   Continuous Infusions: . furosemide (LASIX) infusion 8 mg/hr (12/31/16  0400)   PRN Meds:.acetaminophen **OR** acetaminophen, ipratropium-albuterol, ondansetron **OR** ondansetron (ZOFRAN) IV, oxyCODONE-acetaminophen   Data Review:   Micro Results Recent Results (from the past 240 hour(s))  MRSA PCR Screening     Status: None   Collection Time: 12/30/16 12:36 AM  Result Value Ref Range Status   MRSA by PCR NEGATIVE NEGATIVE Final    Comment:        The GeneXpert MRSA Assay (FDA approved for NASAL specimens only), is one component of a comprehensive MRSA colonization surveillance program. It is not intended to diagnose MRSA infection nor to guide or monitor treatment for MRSA infections.     Radiology Reports Dg Chest Portable 1 View  Result Date: 12/29/2016 CLINICAL DATA:  Initial evaluation for acute shortness of breath. History of CHF, COPD, hypertension. EXAM: PORTABLE CHEST 1 VIEW COMPARISON:  Prior radiograph from 02/26/2016. FINDINGS: Moderate cardiomegaly, stable. Apparent widening of the mediastinum favored to be related to AP technique, shallow lung inflation, and lordotic angulation. Lungs normally inflated. Diffuse pulmonary vascular and interstitial congestion without frank pulmonary edema. Underlying COPD. No focal infiltrates. No appreciable pleural effusion. Mild right basilar subsegmental atelectasis. No pneumothorax. No acute osseus abnormality. IMPRESSION: 1. Cardiomegaly with mild diffuse pulmonary interstitial congestion without frank pulmonary edema. 2. Mild right basilar atelectasis. Electronically Signed   By: Jeannine Boga M.D.   On: 12/29/2016 17:14   Dg Hip Unilat W Or Wo Pelvis 2-3 Views Left  Result Date: 12/29/2016 CLINICAL DATA:  Initial evaluation for multiple recent falls, pain. EXAM: DG HIP (WITH OR WITHOUT PELVIS) 2-3V LEFT COMPARISON:  Prior radiograph from 07/12/2016. FINDINGS: Left hip in slight internal rotation. No acute fracture dislocation. Femoral head in normal alignment with the acetabulum. Heterotopic  calcification adjacent to the greater trochanter is stable. Bony pelvis intact. SI joints approximated. Mild degenerative osteoarthrosis. Degenerative changes noted within lower lumbar spine. No acute soft tissue abnormality.  Atherosclerosis. IMPRESSION: 1. No acute osseous abnormality about the left hip. 2. Mild degenerative osteoarthrosis. Electronically Signed   By: Jeannine Boga M.D.   On: 12/29/2016 17:12  Dg Hip Unilat  With Pelvis 2-3 Views Right  Result Date: 12/29/2016 CLINICAL DATA:  Initial evaluation for multiple recent falls, acute right hip pain. EXAM: DG HIP (WITH OR WITHOUT PELVIS) 2-3V RIGHT COMPARISON:  None. FINDINGS: No acute fracture or dislocation. Femoral head in normal alignment with the acetabulum. Femoral head height preserved. Bony pelvis intact. SI joints approximated. Mild degenerative osteoarthrosis about the hips bilaterally. Degenerative changes noted within lower lumbar spine. No acute soft tissue abnormality.  Atherosclerosis noted. IMPRESSION: 1. No acute osseous abnormality about the right hip. 2. Mild degenerative osteoarthrosis. Electronically Signed   By: Jeannine Boga M.D.   On: 12/29/2016 17:10     CBC Recent Labs  Lab 12/29/16 1604 12/30/16 0417 12/30/16 0705  WBC 10.4 12.0* 12.2*  HGB 9.9* 10.3* 10.3*  HCT 30.5* 31.7* 31.5*  PLT 207 214 194  MCV 92.9 91.9 92.3  MCH 30.1 29.8 30.0  MCHC 32.4 32.4 32.5  RDW 15.6* 15.6* 15.4*    Chemistries  Recent Labs  Lab 12/29/16 1604 12/30/16 0417 12/30/16 0705 12/31/16 0534  NA 139  --  137 135  K 4.7  --  5.0 4.3  CL 109  --  109 109  CO2 18*  --  17* 16*  GLUCOSE 189*  --  183* 170*  BUN 71*  --  75* 94*  CREATININE 4.40* 4.50* 4.73* 5.46*  CALCIUM 8.4*  --  8.2* 7.4*   ------------------------------------------------------------------------------------------------------------------ estimated creatinine clearance is 23.1 mL/min (A) (by C-G formula based on SCr of 5.46 mg/dL  (H)). ------------------------------------------------------------------------------------------------------------------ No results for input(s): HGBA1C in the last 72 hours. ------------------------------------------------------------------------------------------------------------------ No results for input(s): CHOL, HDL, LDLCALC, TRIG, CHOLHDL, LDLDIRECT in the last 72 hours. ------------------------------------------------------------------------------------------------------------------ No results for input(s): TSH, T4TOTAL, T3FREE, THYROIDAB in the last 72 hours.  Invalid input(s): FREET3 ------------------------------------------------------------------------------------------------------------------ No results for input(s): VITAMINB12, FOLATE, FERRITIN, TIBC, IRON, RETICCTPCT in the last 72 hours.  Coagulation profile Recent Labs  Lab 12/29/16 2031  INR 1.09    No results for input(s): DDIMER in the last 72 hours.  Cardiac Enzymes Recent Labs  Lab 12/30/16 0152 12/30/16 0705 12/30/16 1307  TROPONINI 1.43* 1.74* 1.84*   ------------------------------------------------------------------------------------------------------------------ Invalid input(s): POCBNP    Assessment & Plan  Patient is a 56 year old presenting with shortness of breath    1. Acute respiratory failure with hypercapnia (HCC) -patient currently off BiPAP This is related to acute on chronic diastolic CHF Seen by nephrology due to renal function worsening will need intermittent hemodialysis  2. COPD (chronic obstructive pulmonary disease) (HCC) -does not appear to be COPD exasperation Continue supportive care and home inhalers, as needed nebs  3.  Elevated troponin possibly due to demand ischemia Has been seen by cardiology Echo without any significant wall motion abnormality   Heparin has been discontinued Treated with aspirin  4.  Acute on chronic renal failure This is due to hepatorenal  syndrome Intermittent hemodialysis per nephrology  5.  Essential hypertension Continue Coreg  6.  Hypothyroidism continue Synthroid  7.  Depression continue Zoloft           Code Status Orders  (From admission, onward)        Start     Ordered   12/30/16 0043  Full code  Continuous     12/30/16 0042    Code Status History    Date Active Date Inactive Code Status Order ID Comments User Context   02/27/2016 00:23 02/28/2016 16:06 Full Code [id number]  Hugelmeyer, Ubaldo Glassing, DO Inpatient  02/08/2016 00:28 02/09/2016 18:20 Full Code [id number]  Laverle Hobby, MD ED   01/06/2015 00:09 01/08/2015 21:38 Full Code [id number]  Hubbard Robinson, MD ED           Consults  intensivist  DVT Prophylaxis  Lovenox   Lab Results  Component Value Date   PLT 194 12/30/2016     Time Spent in minutes  56min Greater than 50% of time spent in care coordination and counseling patient regarding the condition and plan of care.   Dustin Flock M.D on 12/31/2016 at 1:58 PM  Between 7am to 6pm - Pager - [phone]  After 6pm go to [email or web address] - password [email or web address] [facility] [place] [facility]   Office  [phone]

## 2016-12-31 NOTE — Progress Notes (Signed)
ABG reveiwed, acidosis worsening, rena function worsening Plan for HD vasc cath placement.   I dicussed plan with Wife and I have stated that patients prognosis is very poor based on his Morbid Obesity. Patients wife does NOT think she can take care of him at home.    Corrin Parker, M.D.  [facility]  Medical Director [place] Director [facility] Cardio-Pulmonary Department

## 2016-12-31 NOTE — Progress Notes (Signed)
Post dialysis assessment 

## 2016-12-31 NOTE — Progress Notes (Signed)
[facility]  SUBJECTIVE: Patient laying in bed, denies chest pain or shortness of breath   Vitals:   12/31/16 0200 12/31/16 0400 12/31/16 0500 12/31/16 0509  BP: 119/65 (!) 116/50 (!) 108/57   Pulse: 72 74 72   Resp: 17 16 17    Temp:  98.7 F (37.1 C)    TempSrc:  Axillary    SpO2: 97% 97% 97%   Weight:    (!) 164 kg (361 lb 8.9 oz)  Height:         Intake/Output Summary (Last 24 hours) at 12/31/2016 0758 Last data filed at 12/31/2016 0400 Gross per 24 hour  Intake 131.73 ml  Output 1200 ml  Net -1068.27 ml      PHYSICAL EXAM  General: Well developed, well nourished, in no acute distress HEENT:  Normocephalic and atramatic Neck:  No JVD.  Lungs: Clear bilaterally to auscultation and percussion. Heart: HRRR . Normal S1 and S2 without gallops or murmurs.  Abdomen: Bowel sounds are positive, abdomen soft and non-tender  Msk:  Back normal, normal gait. Normal strength and tone for age. Extremities: No clubbing, cyanosis or edema.   Neuro: Alert and oriented X 3. Psych:  Good affect, responds appropriately   LABS: Basic Metabolic Panel: Recent Labs    12/30/16 0705 12/31/16 0534  NA 137 135  K 5.0 4.3  CL 109 109  CO2 17* 16*  GLUCOSE 183* 170*  BUN 75* 94*  CREATININE 4.73* 5.46*  CALCIUM 8.2* 7.4*   Liver Function Tests: No results for input(s): AST, ALT, ALKPHOS, BILITOT, PROT, ALBUMIN in the last 72 hours. No results for input(s): LIPASE, AMYLASE in the last 72 hours. CBC: Recent Labs    12/30/16 0417 12/30/16 0705  WBC 12.0* 12.2*  HGB 10.3* 10.3*  HCT 31.7* 31.5*  MCV 91.9 92.3  PLT 214 194   Cardiac Enzymes: Recent Labs    12/30/16 0152 12/30/16 0705 12/30/16 1307  TROPONINI 1.43* 1.74* 1.84*   BNP: Invalid input(s): POCBNP D-Dimer: No results for input(s): DDIMER in the last 72 hours. Hemoglobin A1C: No results for input(s): HGBA1C in the last 72 hours. Fasting Lipid Panel: No results for input(s): CHOL, HDL, LDLCALC, TRIG,  CHOLHDL, LDLDIRECT in the last 72 hours. Thyroid Function Tests: No results for input(s): TSH, T4TOTAL, T3FREE, THYROIDAB in the last 72 hours.  Invalid input(s): FREET3 Anemia Panel: No results for input(s): VITAMINB12, FOLATE, FERRITIN, TIBC, IRON, RETICCTPCT in the last 72 hours.  Dg Chest Portable 1 View  Result Date: 12/29/2016 CLINICAL DATA:  Initial evaluation for acute shortness of breath. History of CHF, COPD, hypertension. EXAM: PORTABLE CHEST 1 VIEW COMPARISON:  Prior radiograph from 02/26/2016. FINDINGS: Moderate cardiomegaly, stable. Apparent widening of the mediastinum favored to be related to AP technique, shallow lung inflation, and lordotic angulation. Lungs normally inflated. Diffuse pulmonary vascular and interstitial congestion without frank pulmonary edema. Underlying COPD. No focal infiltrates. No appreciable pleural effusion. Mild right basilar subsegmental atelectasis. No pneumothorax. No acute osseus abnormality. IMPRESSION: 1. Cardiomegaly with mild diffuse pulmonary interstitial congestion without frank pulmonary edema. 2. Mild right basilar atelectasis. Electronically Signed   By: Jeannine Boga M.D.   On: 12/29/2016 17:14   Dg Hip Unilat W Or Wo Pelvis 2-3 Views Left  Result Date: 12/29/2016 CLINICAL DATA:  Initial evaluation for multiple recent falls, pain. EXAM: DG HIP (WITH OR WITHOUT PELVIS) 2-3V LEFT COMPARISON:  Prior radiograph from 07/12/2016. FINDINGS: Left hip in slight internal rotation. No acute fracture dislocation. Femoral head in  normal alignment with the acetabulum. Heterotopic calcification adjacent to the greater trochanter is stable. Bony pelvis intact. SI joints approximated. Mild degenerative osteoarthrosis. Degenerative changes noted within lower lumbar spine. No acute soft tissue abnormality.  Atherosclerosis. IMPRESSION: 1. No acute osseous abnormality about the left hip. 2. Mild degenerative osteoarthrosis. Electronically Signed   By: Jeannine Boga M.D.   On: 12/29/2016 17:12   Dg Hip Unilat  With Pelvis 2-3 Views Right  Result Date: 12/29/2016 CLINICAL DATA:  Initial evaluation for multiple recent falls, acute right hip pain. EXAM: DG HIP (WITH OR WITHOUT PELVIS) 2-3V RIGHT COMPARISON:  None. FINDINGS: No acute fracture or dislocation. Femoral head in normal alignment with the acetabulum. Femoral head height preserved. Bony pelvis intact. SI joints approximated. Mild degenerative osteoarthrosis about the hips bilaterally. Degenerative changes noted within lower lumbar spine. No acute soft tissue abnormality.  Atherosclerosis noted. IMPRESSION: 1. No acute osseous abnormality about the right hip. 2. Mild degenerative osteoarthrosis. Electronically Signed   By: Jeannine Boga M.D.   On: 12/29/2016 17:10     Echo results pending  TELEMETRY: Sinus rhythm:  ASSESSMENT AND PLAN:  Principal Problem:   Acute respiratory failure with hypercapnia (HCC) Active Problems:   HLD (hyperlipidemia)   HTN (hypertension)   DM (diabetes mellitus) type II controlled with renal manifestation (HCC)   COPD (chronic obstructive pulmonary disease) (HCC)   Obstructive sleep apnea   Acute on chronic diastolic CHF (congestive heart failure) ([place])    1.  Mildly elevated troponin, in the absence of chest pain or new ECG changes, in the setting of lethargy, with acute on chronic renal failure, likely demand supply ischemia, not due to acute coronary syndrome 2.  Respiratory failure, multifactorial, with acute on chronic diastolic congestive heart failure, acute on chronic renal failure, and underlying COPD, currently not requiring supplemental O2.  Patient has known normal left ventricular function by prior 2D echocardiogram 02/09/2016. 3.  Acute on chronic renal failure, on furosemide drip, with worsening BUN and creatinine of 94 and 5.46, respectively  Recommendations  1.  Agree with overall current therapy 2.  Await further  recommendations per nephrology 3.  Review 2D echocardiogram 4.  Further recommendations pending echocardiogram results   Isaias Cowman, MD, PhD, [facility] 12/31/2016 7:58 AM

## 2016-12-31 NOTE — Progress Notes (Signed)
This note also relates to the following rows which could not be included: Pulse Rate - Cannot attach notes to unvalidated device data Resp - Cannot attach notes to unvalidated device data BP - Cannot attach notes to unvalidated device data  Hd completed  

## 2016-12-31 NOTE — Progress Notes (Addendum)
  [facility]  ROUNDING NOTE   Subjective:   Off BIPAP  UOP 1200  Furosemide gtt  Objective:  Vital signs:   Physical Exam: General: Critically ill  Head: [place]/AT  Eyes: Anicteric, PERRL  Neck: obese  Lungs:  Diminished bilaterally  Heart: Regular rate and rhythm  Abdomen:  Soft, nontender, obese, +abdominal wall edema  Extremities: + peripheral edema.  Neurologic: Nonfocal, moving all four extremities  Skin: No lesions       Basic Metabolic Panel: Recent Labs  Lab 12/29/16 1604 12/30/16 0417 12/30/16 0705 12/31/16 0534  NA 139  --  137 135  K 4.7  --  5.0 4.3  CL 109  --  109 109  CO2 18*  --  17* 16*  GLUCOSE 189*  --  183* 170*  BUN 71*  --  75* 94*  CREATININE 4.40* 4.50* 4.73* 5.46*  CALCIUM 8.4*  --  8.2* 7.4*    Liver Function Tests: No results for input(s): AST, ALT, ALKPHOS, BILITOT, PROT, ALBUMIN in the last 168 hours. No results for input(s): LIPASE, AMYLASE in the last 168 hours. No results for input(s): AMMONIA in the last 168 hours.  CBC: Recent Labs  Lab 12/29/16 1604 12/30/16 0417 12/30/16 0705  WBC 10.4 12.0* 12.2*  HGB 9.9* 10.3* 10.3*  HCT 30.5* 31.7* 31.5*  MCV 92.9 91.9 92.3  PLT 207 214 194    Microbiology: Results for orders placed or performed during the hospital encounter of 12/29/16  MRSA PCR Screening     Status: None   Collection Time: 12/30/16 12:36 AM  Result Value Ref Range Status   MRSA by PCR NEGATIVE NEGATIVE Final    Comment:        The GeneXpert MRSA Assay (FDA approved for NASAL specimens only), is one component of a comprehensive MRSA colonization surveillance program. It is not intended to diagnose MRSA infection nor to guide or monitor treatment for MRSA infections.      Assessment/ Plan:  MACHAEL RAINE is a  56 y.o. white male  with diabetes mellitus type II insulin dependent, diabetic peripheral neuropathy, right toe amputation, hypertension, hyperlipidemia, hypogonadism,  COPD/tobacco use, depression   1. Acute Renal Failure on chronic kidney disease stage IV with proteinuria and hyperkalemia: baseline creatinine 2.55, GFR of 26 on 10/14 Nephrotic range proteinuria Chronic kidney disease secondary to diabetes and hypertension. Unable to tolerate ACE-I/ARB in past due to hyperkalemia Acute renal failure is concerning for acute cardiorenal syndrome.  - Placed foley catheter for accurate urine output - Discussed dialysis with patient and family. Will do a trial of intermittent hemodialysis.  - ICU team to place catheter.   2. Hypotension with systolic congestive heart failure: echo with preserved systolic function. Suspect underlying diastolic congestive heart failure.  - Continue furosemide gtt.  - Furosemide 80mg  IV bolus x 1  - metolazone 25mg  PO x 1  3. Acute exacerbation of COPD and OSA: with hypercarbic hypoxic respiratory failure. Breathing room air.    LOS: [place], [place] 11/9/20189:42 AM

## 2017-01-01 LAB — BASIC METABOLIC PANEL
ANION GAP: 11 (ref 5–15)
BUN: 91 mg/dL — ABNORMAL HIGH (ref 6–20)
CO2: 19 mmol/L — ABNORMAL LOW (ref 22–32)
Calcium: 7.3 mg/dL — ABNORMAL LOW (ref 8.9–10.3)
Chloride: 107 mmol/L (ref 101–111)
Creatinine, Ser: 4.95 mg/dL — ABNORMAL HIGH (ref 0.61–1.24)
GFR, EST AFRICAN AMERICAN: 14 mL/min — AB (ref >60)
GFR, EST NON AFRICAN AMERICAN: 12 mL/min — AB (ref >60)
Glucose, Bld: 131 mg/dL — ABNORMAL HIGH (ref 65–99)
POTASSIUM: 4 mmol/L (ref 3.5–5.1)
SODIUM: 137 mmol/L (ref 135–145)

## 2017-01-01 LAB — GLUCOSE, CAPILLARY
GLUCOSE-CAPILLARY: 122 mg/dL — AB (ref 65–99)
GLUCOSE-CAPILLARY: 124 mg/dL — AB (ref 65–99)
GLUCOSE-CAPILLARY: 145 mg/dL — AB (ref 65–99)
Glucose-Capillary: 145 mg/dL — ABNORMAL HIGH (ref 65–99)
Glucose-Capillary: 136 mg/dL — ABNORMAL HIGH (ref 65–99)
Glucose-Capillary: 174 mg/dL — ABNORMAL HIGH (ref 65–99)

## 2017-01-01 LAB — HEPATITIS B CORE ANTIBODY, TOTAL: Hep B Core Total Ab: NEGATIVE

## 2017-01-01 LAB — HEPATITIS B SURFACE ANTIGEN: Hepatitis B Surface Ag: NEGATIVE

## 2017-01-01 LAB — PARATHYROID HORMONE, INTACT (NO CA): PTH: 228 pg/mL — AB (ref 15–65)

## 2017-01-01 LAB — HEPATITIS B SURFACE ANTIBODY,QUALITATIVE: Hep B S Ab: NONREACTIVE

## 2017-01-01 MED ORDER — ALBUMIN HUMAN 25 % IV SOLN
12.5000 g | Freq: Three times a day (TID) | INTRAVENOUS | Status: AC
  Administered 2017-01-01 – 2017-01-04 (×7): 12.5 g via INTRAVENOUS
  Filled 2017-01-01 (×9): qty 50

## 2017-01-01 NOTE — Consult Note (Signed)
   Name: Brian [name] MRN: [id number] DOB: 1960/08/29    ADMISSION DATE:  12/29/2016  CONSULTATION DATE:  12/29/16  REFERRING MD :  Dr.Willis  CHIEF COMPLAINT: Fall/weakness  BRIEF PATIENT DESCRIPTION: 56 year old male with Acute on Chronic Hypoxic/Hypercarbic respiratory failure related to CHF exacerbation Vs Cardiorenal Syndrome.  SIGNIFICANT EVENTS  11/7 Patient admitted to the SDU with Acute Hypoxic/Hypercarbic respiratory failure related to CHF exacerbation Requiring BiPAP  STUDIES:  02/09/16 ECHO>>Left ventricle: The cavity size was normal. There was mild   concentric hypertrophy. Systolic function was normal. The  estimated ejection fraction was in the range of 55% to 60%   HISTORY OF PRESENT ILLNESS:  Alert and awake, follows commands Started on HD yesterday for worsening renal failure   VITAL SIGNS: Temp:  [97.8 F (36.6 C)-99.2 F (37.3 C)] 99.2 F (37.3 C) (11/10 0752) Pulse Rate:  [61-74] 73 (11/10 0752) Resp:  [13-23] 16 (11/10 0752) BP: (86-146)/(33-88) 104/61 (11/10 0700) SpO2:  [81 %-99 %] 96 % (11/10 0815) FiO2 (%):  [30 %] 30 % (11/09 2201) Weight:  [352 lb 15.3 oz (160.1 kg)-358 lb 4 oz (162.5 kg)] 352 lb 15.3 oz (160.1 kg) (11/10 0500)   PHYSICAL EXAMINATION: General:  Morbidly obese,Caucasian male, on Bipap Neuro:  Awake,alert and oriented HEENT:  Atraumatic,normocephalic,no jvd Cardiovascular:  S1S2,regular,no m/r/g Lungs:  Diminished , no wheezes,crackles and rhonchi Abdomen:  Soft,NT,ND Musculoskeletal:  Amputated left toe,+2 pitting edema Skin: Open areas left foot Recent Labs  Lab 12/31/16 0534 12/31/16 1603 01/01/17 0430  NA 135 136 137  K 4.3 4.1 4.0  CL 109 106 107  CO2 16* 18* 19*  BUN 94* 92* 91*  CREATININE 5.46* 5.41* 4.95*  GLUCOSE 170* 158* 131*   Recent Labs  Lab 12/30/16 0417 12/30/16 0705 12/31/16 1603  HGB 10.3* 10.3* 9.0*  HCT 31.7* 31.5* 27.4*  WBC 12.0* 12.2* 9.8  PLT 214 194 199    ASSESSMENT /  PLAN:  56 year old morbidly obese white male with underlying sleep apnea admitted to the ICU for acute on chronic respiratory distress related to decompensated cardiorenal syndrome  -Acute On Chronic hypoxic /hypercarbic respiratory failure related to CHF exacerbation/renal exacerbation -COPD -Elevated troponin due to demand ischemia -Cardio Renal Syndrome decompensated -Obesity Hypoventilation syndrome -OSA -Hypertension -Hyperlipidemia -Hypothyroidism   Plan  Bipap, wean as tolerated Continue Bronchodilators Cardiology consulted Continue Aspirin Continue Atorvastatin Nephrology consulted for worsening renal function-started on HD-follow up nephrology recs Continue Coreg Continue Synhroid  Ok to transfer to gen med floor  Trinten Boudoin Patricia Pesa, M.D.  [facility]  Medical Director [place] Director [place] Department

## 2017-01-01 NOTE — Progress Notes (Signed)
[place] at [facility]                                                                                                                                                                                  Patient Demographics   Brian [name], is a 56 y.o. male, DOB - 06-Aug-1960, KGM:[id number]  Admit date - 12/29/2016   Admitting Physician Lance Coon, MD  Outpatient Primary MD for the patient is Lorelee Market, MD   LOS - 3  Subjective: Patient shortness of breath improved after hemodialysis yesterday Off BiPAP  Review of Systems:   CONSTITUTIONAL: No documented fever. No fatigue, weakness. No weight gain, no weight loss.  EYES: No blurry or double vision.  ENT: No tinnitus. No postnasal drip. No redness of the oropharynx.  RESPIRATORY: No cough, no wheeze, no hemoptysis.  Positive dyspnea.  CARDIOVASCULAR: No chest pain. No orthopnea. No palpitations. No syncope.  GASTROINTESTINAL: No nausea, no vomiting or diarrhea. No abdominal pain. No melena or hematochezia.  GENITOURINARY: No dysuria or hematuria.  ENDOCRINE: No polyuria or nocturia. No heat or cold intolerance.  HEMATOLOGY: No anemia. No bruising. No bleeding.  INTEGUMENTARY: No rashes. No lesions.  MUSCULOSKELETAL: No arthritis. No swelling. No gout.  NEUROLOGIC: No numbness, tingling, or ataxia. No seizure-type activity.  PSYCHIATRIC: No anxiety. No insomnia. No ADD.    Vitals:   Vitals:   01/01/17 0752 01/01/17 0800 01/01/17 0815 01/01/17 0900  BP:  125/66  130/67  Pulse: 73 71  75  Resp: 16 20  17   Temp: 99.2 F (37.3 C)     TempSrc: Oral     SpO2: 98% 96% 96% 95%  Weight:      Height:        Wt Readings from Last 3 Encounters:  01/01/17 (!) 160.1 kg (352 lb 15.3 oz)  11/29/16 (!) 155.7 kg (343 lb 4 oz)  10/14/16 (!) 154.8 kg (341 lb 4 oz)     Intake/Output Summary (Last 24 hours) at 01/01/2017 1502 Last data filed at 01/01/2017 1336 Gross per 24 hour  Intake 480 ml   Output 2380 ml  Net -1900 ml    Physical Exam:   GENERAL: Pleasant-appearing in no apparent distress.  Morbidly obese HEAD, EYES, EARS, NOSE AND THROAT: Atraumatic, normocephalic. Extraocular muscles are intact. Pupils equal and reactive to light. Sclerae anicteric. No conjunctival injection. No oro-pharyngeal erythema.  NECK: Supple. There is no jugular venous distention. No bruits, no lymphadenopathy, no thyromegaly.  HEART: Regular rate and rhythm,. No murmurs, no rubs, no clicks.  LUNGS: Clear to auscultation bilaterally. No rales or rhonchi. No wheezes.  ABDOMEN: Soft, flat, nontender, nondistended. Has good  bowel sounds. No hepatosplenomegaly appreciated.  EXTREMITIES: No evidence of any cyanosis, clubbing, or 2+ pitting peripheral edema.  +2 pedal and radial pulses bilaterally.  NEUROLOGIC: The patient is alert, awake, and oriented x3 with no focal motor or sensory deficits appreciated bilaterally.  SKIN: Moist and warm with no rashes appreciated.  Psych: Not anxious, depressed LN: No inguinal LN enlargement    Antibiotics   Anti-infectives (From admission, onward)   None      Medications   Scheduled Meds: . arformoterol  15 mcg Nebulization BID  . aspirin EC  81 mg Oral Daily  . atorvastatin  80 mg Oral Daily  . carvedilol  25 mg Oral BID WC  . chlorhexidine  15 mL Mouth Rinse BID  . gabapentin  600 mg Oral TID  . heparin subcutaneous  5,000 Units Subcutaneous Q8H  . Influenza vac split quadrivalent PF  0.5 mL Intramuscular Tomorrow-1000  . insulin aspart  0-15 Units Subcutaneous Q4H  . levothyroxine  50 mcg Oral QAC breakfast  . mouth rinse  15 mL Mouth Rinse q12n4p  . sertraline  25 mg Oral Daily  . umeclidinium bromide  1 puff Inhalation Daily   Continuous Infusions: . albumin human     PRN Meds:.acetaminophen **OR** acetaminophen, ipratropium-albuterol, ondansetron **OR** ondansetron (ZOFRAN) IV, oxyCODONE-acetaminophen   Data Review:   Micro  Results Recent Results (from the past 240 hour(s))  MRSA PCR Screening     Status: None   Collection Time: 12/30/16 12:36 AM  Result Value Ref Range Status   MRSA by PCR NEGATIVE NEGATIVE Final    Comment:        The GeneXpert MRSA Assay (FDA approved for NASAL specimens only), is one component of a comprehensive MRSA colonization surveillance program. It is not intended to diagnose MRSA infection nor to guide or monitor treatment for MRSA infections.     Radiology Reports Dg Chest Port 1 View  Result Date: 12/31/2016 CLINICAL DATA:  Central line placement EXAM: PORTABLE CHEST 1 VIEW COMPARISON:  12/29/2016 FINDINGS: Right jugular central venous catheter tip in the proximal SVC. No pneumothorax Progression of pulmonary vascular congestion and mild edema. No effusion. IMPRESSION: Central line placement in the proximal SVC.  No pneumothorax Progression of heart failure with mild edema. Electronically Signed   By: Franchot Gallo M.D.   On: 12/31/2016 15:01   Dg Chest Portable 1 View  Result Date: 12/29/2016 CLINICAL DATA:  Initial evaluation for acute shortness of breath. History of CHF, COPD, hypertension. EXAM: PORTABLE CHEST 1 VIEW COMPARISON:  Prior radiograph from 02/26/2016. FINDINGS: Moderate cardiomegaly, stable. Apparent widening of the mediastinum favored to be related to AP technique, shallow lung inflation, and lordotic angulation. Lungs normally inflated. Diffuse pulmonary vascular and interstitial congestion without frank pulmonary edema. Underlying COPD. No focal infiltrates. No appreciable pleural effusion. Mild right basilar subsegmental atelectasis. No pneumothorax. No acute osseus abnormality. IMPRESSION: 1. Cardiomegaly with mild diffuse pulmonary interstitial congestion without frank pulmonary edema. 2. Mild right basilar atelectasis. Electronically Signed   By: Jeannine Boga M.D.   On: 12/29/2016 17:14   Dg Hip Unilat W Or Wo Pelvis 2-3 Views Left  Result Date:  12/29/2016 CLINICAL DATA:  Initial evaluation for multiple recent falls, pain. EXAM: DG HIP (WITH OR WITHOUT PELVIS) 2-3V LEFT COMPARISON:  Prior radiograph from 07/12/2016. FINDINGS: Left hip in slight internal rotation. No acute fracture dislocation. Femoral head in normal alignment with the acetabulum. Heterotopic calcification adjacent to the greater trochanter is stable. Bony pelvis intact.  SI joints approximated. Mild degenerative osteoarthrosis. Degenerative changes noted within lower lumbar spine. No acute soft tissue abnormality.  Atherosclerosis. IMPRESSION: 1. No acute osseous abnormality about the left hip. 2. Mild degenerative osteoarthrosis. Electronically Signed   By: Jeannine Boga M.D.   On: 12/29/2016 17:12   Dg Hip Unilat  With Pelvis 2-3 Views Right  Result Date: 12/29/2016 CLINICAL DATA:  Initial evaluation for multiple recent falls, acute right hip pain. EXAM: DG HIP (WITH OR WITHOUT PELVIS) 2-3V RIGHT COMPARISON:  None. FINDINGS: No acute fracture or dislocation. Femoral head in normal alignment with the acetabulum. Femoral head height preserved. Bony pelvis intact. SI joints approximated. Mild degenerative osteoarthrosis about the hips bilaterally. Degenerative changes noted within lower lumbar spine. No acute soft tissue abnormality.  Atherosclerosis noted. IMPRESSION: 1. No acute osseous abnormality about the right hip. 2. Mild degenerative osteoarthrosis. Electronically Signed   By: Jeannine Boga M.D.   On: 12/29/2016 17:10     CBC Recent Labs  Lab 12/29/16 1604 12/30/16 0417 12/30/16 0705 12/31/16 1603  WBC 10.4 12.0* 12.2* 9.8  HGB 9.9* 10.3* 10.3* 9.0*  HCT 30.5* 31.7* 31.5* 27.4*  PLT 207 214 194 199  MCV 92.9 91.9 92.3 91.4  MCH 30.1 29.8 30.0 30.0  MCHC 32.4 32.4 32.5 32.8  RDW 15.6* 15.6* 15.4* 15.5*    Chemistries  Recent Labs  Lab 12/29/16 1604 12/30/16 0417 12/30/16 0705 12/31/16 0534 12/31/16 1603 01/01/17 0430  NA 139  --  137 135  136 137  K 4.7  --  5.0 4.3 4.1 4.0  CL 109  --  109 109 106 107  CO2 18*  --  17* 16* 18* 19*  GLUCOSE 189*  --  183* 170* 158* 131*  BUN 71*  --  75* 94* 92* 91*  CREATININE 4.40* 4.50* 4.73* 5.46* 5.41* 4.95*  CALCIUM 8.4*  --  8.2* 7.4* 7.3* 7.3*  AST  --   --   --   --  15  --   ALT  --   --   --   --  29  --   ALKPHOS  --   --   --   --  126  --   BILITOT  --   --   --   --  0.4  --    ------------------------------------------------------------------------------------------------------------------ estimated creatinine clearance is 25.1 mL/min (A) (by C-G formula based on SCr of 4.95 mg/dL (H)). ------------------------------------------------------------------------------------------------------------------ No results for input(s): HGBA1C in the last 72 hours. ------------------------------------------------------------------------------------------------------------------ No results for input(s): CHOL, HDL, LDLCALC, TRIG, CHOLHDL, LDLDIRECT in the last 72 hours. ------------------------------------------------------------------------------------------------------------------ No results for input(s): TSH, T4TOTAL, T3FREE, THYROIDAB in the last 72 hours.  Invalid input(s): FREET3 ------------------------------------------------------------------------------------------------------------------ No results for input(s): VITAMINB12, FOLATE, FERRITIN, TIBC, IRON, RETICCTPCT in the last 72 hours.  Coagulation profile Recent Labs  Lab 12/29/16 2031  INR 1.09    No results for input(s): DDIMER in the last 72 hours.  Cardiac Enzymes Recent Labs  Lab 12/30/16 0152 12/30/16 0705 12/30/16 1307  TROPONINI 1.43* 1.74* 1.84*   ------------------------------------------------------------------------------------------------------------------ Invalid input(s): POCBNP    Assessment & Plan  Patient is a 56 year old presenting with shortness of breath    1. Acute respiratory  failure with hypercapnia (HCC) -patient currently off BiPAP This is related to acute on chronic diastolic CHF Clinically improving transfer to floor today 01/01/2017 intermittent hemodialysis, dialysis yesterday, follow-up with nephrology  2. COPD (chronic obstructive pulmonary disease) ([place]) -does not appear to be COPD exasperation Continue supportive  care and home inhalers, as needed nebs  3.  Elevated troponin possibly due to demand ischemia Has been seen by cardiology-Dr. Saralyn Pilar Echo without any significant wall motion abnormality   Heparin has been discontinued Treated with aspirin  4.  Acute on chronic renal failure This is due to hepatorenal syndrome Intermittent hemodialysis per nephrology  5.  Essential hypertension Continue Coreg  6.  Hypothyroidism continue Synthroid  7.  Depression continue Zoloft   Back to floor        Code Status Orders  (From admission, onward)        Start     Ordered   12/30/16 0043  Full code  Continuous     12/30/16 0042    Code Status History    Date Active Date Inactive Code Status Order ID Comments User Context   02/27/2016 00:23 02/28/2016 16:06 Full Code [id number]  Harvie Bridge, DO Inpatient   02/08/2016 00:28 02/09/2016 18:20 Full Code [id number]  Laverle Hobby, MD ED   01/06/2015 00:09 01/08/2015 21:38 Full Code [id number]  Hubbard Robinson, MD ED           Consults  intensivist  DVT Prophylaxis  Lovenox   Lab Results  Component Value Date   PLT 199 12/31/2016     Time Spent in minutes  41min Greater than 50% of time spent in care coordination and counseling patient regarding the condition and plan of care.     Nicholes Mango M.D on 01/01/2017 at 3:02 PM  Between 7am to 6pm - Pager - [phone]  After 6pm go to [email or web address] - password [email or web address] [place]   Office  [phone]

## 2017-01-01 NOTE — Progress Notes (Signed)
[facility]  ROUNDING NOTE   Subjective:   Hemodialysis treatment yesterday for metabolic acidosis and uremia.   UOP 1555  Now off furosemide  Objective:  Vital signs:   Physical Exam: General: Critically ill  Head: /AT  Eyes: Anicteric, PERRL  Neck: obese  Lungs:  Diminished bilaterally  Heart: Regular rate and rhythm  Abdomen:  Soft, nontender, obese, +abdominal wall edema  Extremities: + peripheral edema.  Neurologic: Nonfocal, moving all four extremities  Skin: No lesions       Basic Metabolic Panel: Recent Labs  Lab 12/29/16 1604 12/30/16 0417 12/30/16 0705 12/31/16 0534 12/31/16 1603 01/01/17 0430  NA 139  --  137 135 136 137  K 4.7  --  5.0 4.3 4.1 4.0  CL 109  --  109 109 106 107  CO2 18*  --  17* 16* 18* 19*  GLUCOSE 189*  --  183* 170* 158* 131*  BUN 71*  --  75* 94* 92* 91*  CREATININE 4.40* 4.50* 4.73* 5.46* 5.41* 4.95*  CALCIUM 8.4*  --  8.2* 7.4* 7.3* 7.3*  PHOS  --   --   --   --  9.0*  --     Liver Function Tests: Recent Labs  Lab 12/31/16 1603  AST 15  ALT 29  ALKPHOS 126  BILITOT 0.4  PROT 6.7  ALBUMIN 2.7*   No results for input(s): LIPASE, AMYLASE in the last 168 hours. No results for input(s): AMMONIA in the last 168 hours.  CBC: Recent Labs  Lab 12/29/16 1604 12/30/16 0417 12/30/16 0705 12/31/16 1603  WBC 10.4 12.0* 12.2* 9.8  HGB 9.9* 10.3* 10.3* 9.0*  HCT 30.5* 31.7* 31.5* 27.4*  MCV 92.9 91.9 92.3 91.4  PLT 207 214 194 199    Microbiology: Results for orders placed or performed during the hospital encounter of 12/29/16  MRSA PCR Screening     Status: None   Collection Time: 12/30/16 12:36 AM  Result Value Ref Range Status   MRSA by PCR NEGATIVE NEGATIVE Final    Comment:        The GeneXpert MRSA Assay (FDA approved for NASAL specimens only), is one component of a comprehensive MRSA colonization surveillance program. It is not intended to diagnose MRSA infection nor to guide or monitor  treatment for MRSA infections.      Assessment/ Plan:  Brian [name] is a  56 y.o. white male  with diabetes mellitus type II insulin dependent, diabetic peripheral neuropathy, right toe amputation, hypertension, hyperlipidemia, hypogonadism, COPD/tobacco use, depression   1. Acute Renal Failure on chronic kidney disease stage IV with proteinuria, metabolic acidosis and hyperkalemia: baseline creatinine 2.55, GFR of 26 on 10/14 Nephrotic range proteinuria Chronic kidney disease secondary to diabetes and hypertension. Unable to tolerate ACE-I/ARB in past due to hyperkalemia Acute renal failure is concerning for acute cardiorenal syndrome. Concern for progression of disease to Stage V CKD - Continue foley catheter for accurate urine output -First hemodialysis yesterday. Tolerated treatment well. Nonoliguric urine output. Concern that patient may need further dialysis and this is progression of kidney disease.   2. Hypotension with systolic congestive heart failure: echo with preserved systolic function. Suspect underlying diastolic congestive heart failure.  - Low threshold to restart diuretics.   3. Acute exacerbation of COPD and OSA: with hypercarbic hypoxic respiratory failure. Breathing room air.   4. Diabetes mellitus type II with chronic kidney disease: insulin dependent. History of poor control. Hemoglobin A1c 11.5% on 1/5.  LOS: Brian [name], [place] 11/10/201810:02 AM

## 2017-01-01 NOTE — Progress Notes (Signed)
Report given to [name], RN on [place]. Patient to be transferred shortly with cardiac monitoring. Brian [name]

## 2017-01-01 NOTE — Progress Notes (Signed)
MEDICATION RELATED CONSULT NOTE - ELECTROLYTE   Pharmacy Consult for electrolytes Labs: Sodium (mmol/L)  Date Value  01/01/2017 137  11/05/2012 136   Potassium (mmol/L)  Date Value  01/01/2017 4.0  11/05/2012 3.7   Magnesium (mg/dL)  Date Value  02/08/2016 1.9   Phosphorus (mg/dL)  Date Value  12/31/2016 9.0 (H)   Calcium (mg/dL)  Date Value  01/01/2017 7.3 (L)   Calcium, Total (mg/dL)  Date Value  11/05/2012 8.3 (L)   Albumin (g/dL)  Date Value  12/31/2016 2.7 (L)  11/03/2012 2.5 (L)    Estimated Creatinine Clearance: 25.1 mL/min (A) (by C-G formula based on SCr of 4.95 mg/dL (H)).  Assessment: 56 yo male admitted with acute respiratory failure. Pharmacy has been consulted to monitor and adjust electrolytes.   Plan:  Patient's electrolytes are within normal limits and no changes are required at this time. Will follow up electrolyte levels with AM labs.    Benjamin Merrihew K, [place] 01/01/2017,8:29 AM

## 2017-01-01 NOTE — Progress Notes (Signed)
[facility]  SUBJECTIVE: Patient sitting up in bed, reports feeling better, denies chest pain with improved breathing   Vitals:   01/01/17 0752 01/01/17 0800 01/01/17 0815 01/01/17 0900  BP:  125/66  130/67  Pulse: 73 71  75  Resp: 16 20  17   Temp: 99.2 F (37.3 C)     TempSrc: Oral     SpO2: 98% 96% 96% 95%  Weight:      Height:         Intake/Output Summary (Last 24 hours) at 01/01/2017 1031 Last data filed at 01/01/2017 1000 Gross per 24 hour  Intake 240 ml  Output 2630 ml  Net -2390 ml      PHYSICAL EXAM  General: Well developed, well nourished, in no acute distress HEENT:  Normocephalic and atramatic Neck:  No JVD.  Lungs: Clear bilaterally to auscultation and percussion. Heart: HRRR . Normal S1 and S2 without gallops or murmurs.  Abdomen: Bowel sounds are positive, abdomen soft and non-tender  Msk:  Back normal, normal gait. Normal strength and tone for age. Extremities: No clubbing, cyanosis or edema.   Neuro: Alert and oriented X 3. Psych:  Good affect, responds appropriately   LABS: Basic Metabolic Panel: Recent Labs    12/31/16 1603 01/01/17 0430  NA 136 137  K 4.1 4.0  CL 106 107  CO2 18* 19*  GLUCOSE 158* 131*  BUN 92* 91*  CREATININE 5.41* 4.95*  CALCIUM 7.3* 7.3*  PHOS 9.0*  --    Liver Function Tests: Recent Labs    12/31/16 1603  AST 15  ALT 29  ALKPHOS 126  BILITOT 0.4  PROT 6.7  ALBUMIN 2.7*   No results for input(s): LIPASE, AMYLASE in the last 72 hours. CBC: Recent Labs    12/30/16 0705 12/31/16 1603  WBC 12.2* 9.8  HGB 10.3* 9.0*  HCT 31.5* 27.4*  MCV 92.3 91.4  PLT 194 199   Cardiac Enzymes: Recent Labs    12/30/16 0152 12/30/16 0705 12/30/16 1307  TROPONINI 1.43* 1.74* 1.84*   BNP: Invalid input(s): POCBNP D-Dimer: No results for input(s): DDIMER in the last 72 hours. Hemoglobin A1C: No results for input(s): HGBA1C in the last 72 hours. Fasting Lipid Panel: No results for input(s): CHOL, HDL,  LDLCALC, TRIG, CHOLHDL, LDLDIRECT in the last 72 hours. Thyroid Function Tests: No results for input(s): TSH, T4TOTAL, T3FREE, THYROIDAB in the last 72 hours.  Invalid input(s): FREET3 Anemia Panel: No results for input(s): VITAMINB12, FOLATE, FERRITIN, TIBC, IRON, RETICCTPCT in the last 72 hours.  Dg Chest Port 1 View  Result Date: 12/31/2016 CLINICAL DATA:  Central line placement EXAM: PORTABLE CHEST 1 VIEW COMPARISON:  12/29/2016 FINDINGS: Right jugular central venous catheter tip in the proximal SVC. No pneumothorax Progression of pulmonary vascular congestion and mild edema. No effusion. IMPRESSION: Central line placement in the proximal SVC.  No pneumothorax Progression of heart failure with mild edema. Electronically Signed   By: Franchot Gallo M.D.   On: 12/31/2016 15:01     Echo normal left ventricular function, with LVEF 55-65%  TELEMETRY: Normal sinus rhythm:  ASSESSMENT AND PLAN:  Principal Problem:   Acute respiratory failure with hypercapnia (HCC) Active Problems:   HLD (hyperlipidemia)   HTN (hypertension)   DM (diabetes mellitus) type II controlled with renal manifestation (HCC)   COPD (chronic obstructive pulmonary disease) (HCC)   Obstructive sleep apnea   Acute on chronic diastolic CHF (congestive heart failure) ([place])    1.  Mildly elevated troponin,  in the absence of chest pain or new ECG changes, in the setting of acute on chronic renal failure with lethargy and weakness, likely due to demand supply ischemia, not due to acute coronary syndrome 2.  Respiratory failure, multifactorial, with acute on chronic diastolic congestive heart failure, with underlying acute on chronic renal failure, COPD, morbid obesity, and sleep apnea with normal left ventricular function by 2D echocardiogram 3.  Acute on chronic renal failure, refractory to furosemide drip, appears to responded well hemodialysis yesterday  Recommendations  1.  Agree with current therapy 2.   Additional hemodialysis per nephrology 3.  Defer further cardiac diagnostics at this time  Sign off for now, please call with any questions  Isaias Cowman, MD, PhD, [facility] 01/01/2017 10:31 AM

## 2017-01-01 NOTE — Progress Notes (Signed)
Patient refused bipap for the night. No distress noted at this time

## 2017-01-02 LAB — CBC
HEMATOCRIT: 26.5 % — AB (ref 40.0–52.0)
Hemoglobin: 8.8 g/dL — ABNORMAL LOW (ref 13.0–18.0)
MCH: 30.3 pg (ref 26.0–34.0)
MCHC: 33.4 g/dL (ref 32.0–36.0)
MCV: 90.8 fL (ref 80.0–100.0)
PLATELETS: 171 10*3/uL (ref 150–440)
RBC: 2.91 MIL/uL — AB (ref 4.40–5.90)
RDW: 14.7 % — ABNORMAL HIGH (ref 11.5–14.5)
WBC: 9.9 10*3/uL (ref 3.8–10.6)

## 2017-01-02 LAB — PROTIME-INR
INR: 1.12
Prothrombin Time: 14.3 seconds (ref 11.4–15.2)

## 2017-01-02 LAB — BASIC METABOLIC PANEL
Anion gap: 11 (ref 5–15)
BUN: 107 mg/dL — AB (ref 6–20)
CALCIUM: 7.4 mg/dL — AB (ref 8.9–10.3)
CO2: 19 mmol/L — AB (ref 22–32)
CREATININE: 5.47 mg/dL — AB (ref 0.61–1.24)
Chloride: 105 mmol/L (ref 101–111)
GFR calc non Af Amer: 10 mL/min — ABNORMAL LOW (ref >60)
GFR, EST AFRICAN AMERICAN: 12 mL/min — AB (ref >60)
Glucose, Bld: 157 mg/dL — ABNORMAL HIGH (ref 65–99)
Potassium: 4.3 mmol/L (ref 3.5–5.1)
SODIUM: 135 mmol/L (ref 135–145)

## 2017-01-02 LAB — GLUCOSE, CAPILLARY
GLUCOSE-CAPILLARY: 127 mg/dL — AB (ref 65–99)
GLUCOSE-CAPILLARY: 132 mg/dL — AB (ref 65–99)
Glucose-Capillary: 143 mg/dL — ABNORMAL HIGH (ref 65–99)
Glucose-Capillary: 117 mg/dL — ABNORMAL HIGH (ref 65–99)
Glucose-Capillary: 128 mg/dL — ABNORMAL HIGH (ref 65–99)

## 2017-01-02 LAB — HEMOGLOBIN A1C
HEMOGLOBIN A1C: 6.8 % — AB (ref 4.8–5.6)
MEAN PLASMA GLUCOSE: 148.46 mg/dL

## 2017-01-02 MED ORDER — FUROSEMIDE 10 MG/ML IJ SOLN
40.0000 mg | Freq: Two times a day (BID) | INTRAMUSCULAR | Status: DC
Start: 2017-01-02 — End: 2017-01-04
  Administered 2017-01-02 – 2017-01-04 (×3): 40 mg via INTRAVENOUS
  Filled 2017-01-02 (×3): qty 4

## 2017-01-02 NOTE — Consult Note (Signed)
Reason for Consult: Acute on CKD IV, requiring permanent HD access Referring Physician: Dr. Cathey [name] is an 56 y.o. male.  HPI: Brian [name] with multiple medical issues, DM, HTN, CHF, morbid obesity. Admitted for CHF exacerbation and acute renal failure. Temp cath placed while patient was critical in the ICU on 11/09. It appears the patient will need long term HD. Currently tolerating HD via temp cath.  Past Medical History:  Diagnosis Date  . CHF (congestive heart failure) ([place])   . COPD (chronic obstructive pulmonary disease) ([place])   . Diabetes mellitus type 2 in obese ([place])   . DM (diabetes mellitus) type II controlled with renal manifestation ([place])   . Hypercholesteremia   . Hypertension   . Morbid obesity with BMI of 45.0-49.9, adult ([place])   . Neuropathy   . Pancreatitis, acute   . Pneumonia   . Spinal stenosis     Past Surgical History:  Procedure Laterality Date  . AMPUTATION    . CHOLECYSTECTOMY  1998    Family History  Problem Relation Age of Onset  . Hypertension Mother   . Hyperlipidemia Mother   . Heart disease Father   . Heart disease Maternal Grandfather     Social History:  reports that he has been smoking cigarettes.  He has been smoking about 1.00 pack per day. he has never used smokeless tobacco. He reports that he does not drink alcohol or use drugs.  Allergies:  Allergies  Allergen Reactions  . Naproxen Rash    Medications: I have reviewed the patient's current medications.  Results for orders placed or performed during the hospital encounter of 12/29/16 (from the past 48 hour(s))  Glucose, capillary     Status: Abnormal   Collection Time: 12/31/16  3:55 PM  Result Value Ref Range   Glucose-Capillary 154 (H) 65 - 99 mg/dL  Hepatitis B surface antigen     Status: None   Collection Time: 12/31/16  4:03 PM  Result Value Ref Range   Hepatitis B Surface Ag Negative Negative    Comment: (NOTE) Performed At: [facility] [place], [place] [id number] Rush Farmer MD [id number]   Hepatitis B surface antibody     Status: None   Collection Time: 12/31/16  4:03 PM  Result Value Ref Range   Hep B S Ab Non Reactive     Comment: (NOTE)              Non Reactive: Inconsistent with immunity,                            less than 10 mIU/mL              Reactive:     Consistent with immunity,                            greater than 9.9 mIU/mL Performed At: [facility] [place], [place] [id number] Rush Farmer MD [id number]   Hepatitis B core antibody, total     Status: None   Collection Time: 12/31/16  4:03 PM  Result Value Ref Range   Hep B Core Total Ab Negative Negative    Comment: (NOTE) Performed At: [facility] [address] [place], [place] [id number] Rush Farmer MD [id number]   Parathyroid hormone, intact (no Ca)     Status: Abnormal   Collection Time:  12/31/16  4:03 PM  Result Value Ref Range   PTH 228 (H) 15 - 65 pg/mL    Comment: (NOTE) Performed At: [facility] [place], [place] [id number] Rush Farmer MD [id number]   CBC     Status: Abnormal   Collection Time: 12/31/16  4:03 PM  Result Value Ref Range   WBC 9.8 3.8 - 10.6 K/uL   RBC 3.00 (L) 4.40 - 5.90 MIL/uL   Hemoglobin 9.0 (L) 13.0 - 18.0 g/dL   HCT 27.4 (L) 40.0 - 52.0 %   MCV 91.4 80.0 - 100.0 fL   MCH 30.0 26.0 - 34.0 pg   MCHC 32.8 32.0 - 36.0 g/dL   RDW 15.5 (H) 11.5 - 14.5 %   Platelets 199 150 - 440 K/uL  Comprehensive metabolic panel     Status: Abnormal   Collection Time: 12/31/16  4:03 PM  Result Value Ref Range   Sodium 136 135 - 145 mmol/L   Potassium 4.1 3.5 - 5.1 mmol/L   Chloride 106 101 - 111 mmol/L   CO2 18 (L) 22 - 32 mmol/L   Glucose, Bld 158 (H) 65 - 99 mg/dL   BUN 92 (H) 6 - 20 mg/dL    Comment: RESULT CONFIRMED BY MANUAL DILUTION JJB   Creatinine, Ser 5.41 (H) 0.61 - 1.24 mg/dL   Calcium 7.3 (L) 8.9 - 10.3 mg/dL    Total Protein 6.7 6.5 - 8.1 g/dL   Albumin 2.7 (L) 3.5 - 5.0 g/dL   AST 15 15 - 41 U/L   ALT 29 17 - 63 U/L   Alkaline Phosphatase 126 38 - 126 U/L   Total Bilirubin 0.4 0.3 - 1.2 mg/dL   GFR calc non Af Amer 11 (L) >60 mL/min   GFR calc Af Amer 12 (L) >60 mL/min    Comment: (NOTE) The eGFR has been calculated using the CKD EPI equation. This calculation has not been validated in all clinical situations. eGFR's persistently <60 mL/min signify possible Chronic Kidney Disease.    Anion gap 12 5 - 15  Phosphorus     Status: Abnormal   Collection Time: 12/31/16  4:03 PM  Result Value Ref Range   Phosphorus 9.0 (H) 2.5 - 4.6 mg/dL  Glucose, capillary     Status: Abnormal   Collection Time: 12/31/16  7:39 PM  Result Value Ref Range   Glucose-Capillary 131 (H) 65 - 99 mg/dL   Comment 1 Notify RN    Comment 2 Document in Chart   Glucose, capillary     Status: Abnormal   Collection Time: 12/31/16  9:47 PM  Result Value Ref Range   Glucose-Capillary 146 (H) 65 - 99 mg/dL   Comment 1 Notify RN    Comment 2 Document in Chart   Glucose, capillary     Status: Abnormal   Collection Time: 01/01/17 12:52 AM  Result Value Ref Range   Glucose-Capillary 145 (H) 65 - 99 mg/dL  Basic metabolic panel     Status: Abnormal   Collection Time: 01/01/17  4:30 AM  Result Value Ref Range   Sodium 137 135 - 145 mmol/L   Potassium 4.0 3.5 - 5.1 mmol/L   Chloride 107 101 - 111 mmol/L   CO2 19 (L) 22 - 32 mmol/L   Glucose, Bld 131 (H) 65 - 99 mg/dL   BUN 91 (H) 6 - 20 mg/dL   Creatinine, Ser 4.95 (H) 0.61 - 1.24 mg/dL   Calcium 7.3 (L) 8.9 -  10.3 mg/dL   GFR calc non Af Amer 12 (L) >60 mL/min   GFR calc Af Amer 14 (L) >60 mL/min    Comment: (NOTE) The eGFR has been calculated using the CKD EPI equation. This calculation has not been validated in all clinical situations. eGFR's persistently <60 mL/min signify possible Chronic Kidney Disease.    Anion gap 11 5 - 15  Glucose, capillary     Status:  Abnormal   Collection Time: 01/01/17  4:38 AM  Result Value Ref Range   Glucose-Capillary 145 (H) 65 - 99 mg/dL   Comment 1 Notify RN    Comment 2 Document in Chart   Glucose, capillary     Status: Abnormal   Collection Time: 01/01/17  7:56 AM  Result Value Ref Range   Glucose-Capillary 136 (H) 65 - 99 mg/dL  Glucose, capillary     Status: Abnormal   Collection Time: 01/01/17 11:45 AM  Result Value Ref Range   Glucose-Capillary 124 (H) 65 - 99 mg/dL   Comment 1 Notify RN   Glucose, capillary     Status: Abnormal   Collection Time: 01/01/17  4:43 PM  Result Value Ref Range   Glucose-Capillary 174 (H) 65 - 99 mg/dL   Comment 1 Notify RN   Glucose, capillary     Status: Abnormal   Collection Time: 01/01/17  8:09 PM  Result Value Ref Range   Glucose-Capillary 122 (H) 65 - 99 mg/dL  Basic metabolic panel     Status: Abnormal   Collection Time: 01/02/17  3:26 AM  Result Value Ref Range   Sodium 135 135 - 145 mmol/L   Potassium 4.3 3.5 - 5.1 mmol/L   Chloride 105 101 - 111 mmol/L   CO2 19 (L) 22 - 32 mmol/L   Glucose, Bld 157 (H) 65 - 99 mg/dL   BUN 107 (H) 6 - 20 mg/dL    Comment: RESULT CONFIRMED BY MANUAL DILUTION/RWW   Creatinine, Ser 5.47 (H) 0.61 - 1.24 mg/dL   Calcium 7.4 (L) 8.9 - 10.3 mg/dL   GFR calc non Af Amer 10 (L) >60 mL/min   GFR calc Af Amer 12 (L) >60 mL/min    Comment: (NOTE) The eGFR has been calculated using the CKD EPI equation. This calculation has not been validated in all clinical situations. eGFR's persistently <60 mL/min signify possible Chronic Kidney Disease.    Anion gap 11 5 - 15  Glucose, capillary     Status: Abnormal   Collection Time: 01/02/17  4:01 AM  Result Value Ref Range   Glucose-Capillary 143 (H) 65 - 99 mg/dL  Glucose, capillary     Status: Abnormal   Collection Time: 01/02/17  7:47 AM  Result Value Ref Range   Glucose-Capillary 127 (H) 65 - 99 mg/dL  Glucose, capillary     Status: Abnormal   Collection Time: 01/02/17 11:47 AM   Result Value Ref Range   Glucose-Capillary 117 (H) 65 - 99 mg/dL   Comment 1 Notify RN     Dg Chest Port 1 View  Result Date: 12/31/2016 CLINICAL DATA:  Central line placement EXAM: PORTABLE CHEST 1 VIEW COMPARISON:  12/29/2016 FINDINGS: Right jugular central venous catheter tip in the proximal SVC. No pneumothorax Progression of pulmonary vascular congestion and mild edema. No effusion. IMPRESSION: Central line placement in the proximal SVC.  No pneumothorax Progression of heart failure with mild edema. Electronically Signed   By: Franchot Gallo M.D.   On: 12/31/2016 15:01  Review of Systems  Constitutional: Positive for malaise/fatigue.  HENT: Negative.   Eyes: Negative.   Respiratory: Positive for shortness of breath.   Cardiovascular: Positive for orthopnea and leg swelling. Negative for chest pain and claudication.  Skin: Negative.   Neurological: Negative for dizziness.  All other systems reviewed and are negative.  Blood pressure (!) 99/54, pulse 61, temperature 98.1 F (36.7 C), temperature source Oral, resp. rate (!) 21, height '5\' 10"'  (1.778 m), weight (!) 160.3 kg (353 lb 8 oz), SpO2 97 %. Physical Exam  Nursing note and vitals reviewed. Constitutional: He is oriented to person, place, and time. He appears well-developed and well-nourished.  HENT:  Head: Normocephalic and atraumatic.  Neck: Normal range of motion. Neck supple.  Cardiovascular: Normal rate and regular rhythm.  Respiratory: No respiratory distress. He has rales.  GI: Soft. He exhibits no distension.  Musculoskeletal: He exhibits edema.  Neurological: He is alert and oriented to person, place, and time.    Assessment/Plan: Acute on Chronic kidney disease IV. Now requiring permanent HD access. Will plan for [facility] tomorrow. Will obtain Vein mapping for fistula placement planning.  Brian [name], [place] A 01/02/2017, 12:53 PM

## 2017-01-02 NOTE — Progress Notes (Signed)
MEDICATION RELATED CONSULT NOTE - ELECTROLYTE   Pharmacy Consult for electrolytes Labs: Sodium (mmol/L)  Date Value  01/02/2017 135  11/05/2012 136   Potassium (mmol/L)  Date Value  01/02/2017 4.3  11/05/2012 3.7   Magnesium (mg/dL)  Date Value  02/08/2016 1.9   Phosphorus (mg/dL)  Date Value  12/31/2016 9.0 (H)   Calcium (mg/dL)  Date Value  01/02/2017 7.4 (L)   Calcium, Total (mg/dL)  Date Value  11/05/2012 8.3 (L)   Albumin (g/dL)  Date Value  12/31/2016 2.7 (L)  11/03/2012 2.5 (L)    Estimated Creatinine Clearance: 22.7 mL/min (A) (by C-G formula based on SCr of 5.47 mg/dL (H)).  Assessment: 56 yo male admitted with acute respiratory failure. Pharmacy has been consulted to monitor and adjust electrolytes.   Plan:  Patient's electrolytes are within normal limits and no changes are required at this time. Will follow up electrolyte levels with AM labs.    Rosealie Reach K, [place] 01/02/2017,7:16 AM

## 2017-01-02 NOTE — Progress Notes (Signed)
[place] [facility]                                                                                                                                                                                  Patient Demographics   Brian [name], is a 56 y.o. male, DOB - 1960/06/28, PYK:[id number]  Admit date - 12/29/2016   Admitting Physician Lance Coon, MD  Outpatient Primary MD for the patient is Lorelee Market, MD   LOS - 4  Subjective: Patient shortness of breath improved , wants temporary dialysis catheter to be removed Unable to tolerate BiPAP last night as he has a right IJ temporary dialysis cath   Review of Systems:   CONSTITUTIONAL: No documented fever. No fatigue, weakness. No weight gain, no weight loss.  EYES: No blurry or double vision.  ENT: No tinnitus. No postnasal drip. No redness of the oropharynx.  RESPIRATORY: No cough, no wheeze, no hemoptysis.  Positive dyspnea.  CARDIOVASCULAR: No chest pain. No orthopnea. No palpitations. No syncope.  GASTROINTESTINAL: No nausea, no vomiting or diarrhea. No abdominal pain. No melena or hematochezia.  GENITOURINARY: No dysuria or hematuria.  ENDOCRINE: No polyuria or nocturia. No heat or cold intolerance.  HEMATOLOGY: No anemia. No bruising. No bleeding.  INTEGUMENTARY: No rashes. No lesions.  MUSCULOSKELETAL: No arthritis. No swelling. No gout.  NEUROLOGIC: No numbness, tingling, or ataxia. No seizure-type activity.  PSYCHIATRIC: No anxiety. No insomnia. No ADD.    Vitals:   Vitals:   01/01/17 2144 01/02/17 0214 01/02/17 0346 01/02/17 1147  BP: (!) 102/51  108/64 (!) 99/54  Pulse: 62  66 61  Resp:   18 (!) 21  Temp: 98.2 F (36.8 C)  98 F (36.7 C) 98.1 F (36.7 C)  TempSrc: Oral  Oral Oral  SpO2: 100%  95% 97%  Weight:  (!) 160.3 kg (353 lb 8 oz)    Height:        Wt Readings from Last 3 Encounters:  01/02/17 (!) 160.3 kg (353 lb 8 oz)  11/29/16 (!) 155.7 kg (343 lb 4 oz)   10/14/16 (!) 154.8 kg (341 lb 4 oz)     Intake/Output Summary (Last 24 hours) at 01/02/2017 1156 Last data filed at 01/02/2017 0346 Gross per 24 hour  Intake 480 ml  Output 651 ml  Net -171 ml    Physical Exam:   GENERAL: Pleasant-appearing in no apparent distress.  Morbidly obese HEAD, EYES, EARS, NOSE AND THROAT: Atraumatic, normocephalic. Extraocular muscles are intact. Pupils equal and reactive to light. Sclerae anicteric. No conjunctival injection. No oro-pharyngeal erythema.  NECK: Supple. There is no jugular venous distention. No bruits, no lymphadenopathy,  no thyromegaly.  HEART: Regular rate and rhythm,. No murmurs, no rubs, no clicks.  LUNGS: Clear to auscultation bilaterally. No rales or rhonchi. No wheezes.  ABDOMEN: Soft, flat, nontender, nondistended. Has good bowel sounds. No hepatosplenomegaly appreciated.  EXTREMITIES: No evidence of any cyanosis, clubbing, or 2+ pitting peripheral edema.  +2 pedal and radial pulses bilaterally.  NEUROLOGIC: The patient is alert, awake, and oriented x3 with no focal motor or sensory deficits appreciated bilaterally.  SKIN: Moist and warm with no rashes appreciated.  Psych: Not anxious, depressed LN: No inguinal LN enlargement    Antibiotics   Anti-infectives (From admission, onward)   None      Medications   Scheduled Meds: . arformoterol  15 mcg Nebulization BID  . aspirin EC  81 mg Oral Daily  . atorvastatin  80 mg Oral Daily  . carvedilol  25 mg Oral BID WC  . chlorhexidine  15 mL Mouth Rinse BID  . gabapentin  600 mg Oral TID  . heparin subcutaneous  5,000 Units Subcutaneous Q8H  . Influenza vac split quadrivalent PF  0.5 mL Intramuscular Tomorrow-1000  . insulin aspart  0-15 Units Subcutaneous Q4H  . levothyroxine  50 mcg Oral QAC breakfast  . mouth rinse  15 mL Mouth Rinse q12n4p  . sertraline  25 mg Oral Daily  . umeclidinium bromide  1 puff Inhalation Daily   Continuous Infusions: . albumin human      PRN Meds:.acetaminophen **OR** acetaminophen, ipratropium-albuterol, ondansetron **OR** ondansetron (ZOFRAN) IV, oxyCODONE-acetaminophen   Data Review:   Micro Results Recent Results (from the past 240 hour(s))  MRSA PCR Screening     Status: None   Collection Time: 12/30/16 12:36 AM  Result Value Ref Range Status   MRSA by PCR NEGATIVE NEGATIVE Final    Comment:        The GeneXpert MRSA Assay (FDA approved for NASAL specimens only), is one component of a comprehensive MRSA colonization surveillance program. It is not intended to diagnose MRSA infection nor to guide or monitor treatment for MRSA infections.     Radiology Reports Dg Chest Port 1 View  Result Date: 12/31/2016 CLINICAL DATA:  Central line placement EXAM: PORTABLE CHEST 1 VIEW COMPARISON:  12/29/2016 FINDINGS: Right jugular central venous catheter tip in the proximal SVC. No pneumothorax Progression of pulmonary vascular congestion and mild edema. No effusion. IMPRESSION: Central line placement in the proximal SVC.  No pneumothorax Progression of heart failure with mild edema. Electronically Signed   By: Franchot Gallo M.D.   On: 12/31/2016 15:01   Dg Chest Portable 1 View  Result Date: 12/29/2016 CLINICAL DATA:  Initial evaluation for acute shortness of breath. History of CHF, COPD, hypertension. EXAM: PORTABLE CHEST 1 VIEW COMPARISON:  Prior radiograph from 02/26/2016. FINDINGS: Moderate cardiomegaly, stable. Apparent widening of the mediastinum favored to be related to AP technique, shallow lung inflation, and lordotic angulation. Lungs normally inflated. Diffuse pulmonary vascular and interstitial congestion without frank pulmonary edema. Underlying COPD. No focal infiltrates. No appreciable pleural effusion. Mild right basilar subsegmental atelectasis. No pneumothorax. No acute osseus abnormality. IMPRESSION: 1. Cardiomegaly with mild diffuse pulmonary interstitial congestion without frank pulmonary edema. 2.  Mild right basilar atelectasis. Electronically Signed   By: Jeannine Boga M.D.   On: 12/29/2016 17:14   Dg Hip Unilat W Or Wo Pelvis 2-3 Views Left  Result Date: 12/29/2016 CLINICAL DATA:  Initial evaluation for multiple recent falls, pain. EXAM: DG HIP (WITH OR WITHOUT PELVIS) 2-3V LEFT COMPARISON:  Prior  radiograph from 07/12/2016. FINDINGS: Left hip in slight internal rotation. No acute fracture dislocation. Femoral head in normal alignment with the acetabulum. Heterotopic calcification adjacent to the greater trochanter is stable. Bony pelvis intact. SI joints approximated. Mild degenerative osteoarthrosis. Degenerative changes noted within lower lumbar spine. No acute soft tissue abnormality.  Atherosclerosis. IMPRESSION: 1. No acute osseous abnormality about the left hip. 2. Mild degenerative osteoarthrosis. Electronically Signed   By: Jeannine Boga M.D.   On: 12/29/2016 17:12   Dg Hip Unilat  With Pelvis 2-3 Views Right  Result Date: 12/29/2016 CLINICAL DATA:  Initial evaluation for multiple recent falls, acute right hip pain. EXAM: DG HIP (WITH OR WITHOUT PELVIS) 2-3V RIGHT COMPARISON:  None. FINDINGS: No acute fracture or dislocation. Femoral head in normal alignment with the acetabulum. Femoral head height preserved. Bony pelvis intact. SI joints approximated. Mild degenerative osteoarthrosis about the hips bilaterally. Degenerative changes noted within lower lumbar spine. No acute soft tissue abnormality.  Atherosclerosis noted. IMPRESSION: 1. No acute osseous abnormality about the right hip. 2. Mild degenerative osteoarthrosis. Electronically Signed   By: Jeannine Boga M.D.   On: 12/29/2016 17:10     CBC Recent Labs  Lab 12/29/16 1604 12/30/16 0417 12/30/16 0705 12/31/16 1603  WBC 10.4 12.0* 12.2* 9.8  HGB 9.9* 10.3* 10.3* 9.0*  HCT 30.5* 31.7* 31.5* 27.4*  PLT 207 214 194 199  MCV 92.9 91.9 92.3 91.4  MCH 30.1 29.8 30.0 30.0  MCHC 32.4 32.4 32.5 32.8  RDW  15.6* 15.6* 15.4* 15.5*    Chemistries  Recent Labs  Lab 12/30/16 0705 12/31/16 0534 12/31/16 1603 01/01/17 0430 01/02/17 0326  NA 137 135 136 137 135  K 5.0 4.3 4.1 4.0 4.3  CL 109 109 106 107 105  CO2 17* 16* 18* 19* 19*  GLUCOSE 183* 170* 158* 131* 157*  BUN 75* 94* 92* 91* 107*  CREATININE 4.73* 5.46* 5.41* 4.95* 5.47*  CALCIUM 8.2* 7.4* 7.3* 7.3* 7.4*  AST  --   --  15  --   --   ALT  --   --  29  --   --   ALKPHOS  --   --  126  --   --   BILITOT  --   --  0.4  --   --    ------------------------------------------------------------------------------------------------------------------ estimated creatinine clearance is 22.7 mL/min (A) (by C-G formula based on SCr of 5.47 mg/dL (H)). ------------------------------------------------------------------------------------------------------------------ No results for input(s): HGBA1C in the last 72 hours. ------------------------------------------------------------------------------------------------------------------ No results for input(s): CHOL, HDL, LDLCALC, TRIG, CHOLHDL, LDLDIRECT in the last 72 hours. ------------------------------------------------------------------------------------------------------------------ No results for input(s): TSH, T4TOTAL, T3FREE, THYROIDAB in the last 72 hours.  Invalid input(s): FREET3 ------------------------------------------------------------------------------------------------------------------ No results for input(s): VITAMINB12, FOLATE, FERRITIN, TIBC, IRON, RETICCTPCT in the last 72 hours.  Coagulation profile Recent Labs  Lab 12/29/16 2031  INR 1.09    No results for input(s): DDIMER in the last 72 hours.  Cardiac Enzymes Recent Labs  Lab 12/30/16 0152 12/30/16 0705 12/30/16 1307  TROPONINI 1.43* 1.74* 1.84*   ------------------------------------------------------------------------------------------------------------------ Invalid input(s): POCBNP    Assessment &  Plan  Patient is a 56 year old presenting with shortness of breath    1. Acute respiratory failure with hypercapnia (HCC) -patient currently off BiPAP This is related to acute on chronic diastolic CHF Clinically improving  transfered to floor today 01/01/2017 intermittent hemodialysis, dialysis 11/9, follow-up with nephrology  2. COPD (chronic obstructive pulmonary disease) ([place]) -does not appear to be COPD exasperation Continue supportive care  and home inhalers, as needed nebs  3.  Elevated troponin possibly due to demand ischemia Has been seen by cardiology-Dr. Saralyn Pilar Echo without any significant wall motion abnormality   Heparin has been discontinued Treated with aspirin  4.  Acute on chronic renal failure This is due to hepatorenal syndrome Intermittent hemodialysis per nephrology, considering to remove temporary dialysis catheter in place permanent dialysis catheter.  Nephro will discuss with vascular  5.  Essential hypertension Continue Coreg  6.  Hypothyroidism continue Synthroid  7.  Depression continue Zoloft   Back to floor        Code Status Orders  (From admission, onward)        Start     Ordered   12/30/16 0043  Full code  Continuous     12/30/16 0042    Code Status History    Date Active Date Inactive Code Status Order ID Comments User Context   02/27/2016 00:23 02/28/2016 16:06 Full Code [id number]  Harvie Bridge, DO Inpatient   02/08/2016 00:28 02/09/2016 18:20 Full Code [id number]  Laverle Hobby, MD ED   01/06/2015 00:09 01/08/2015 21:38 Full Code [id number]  Hubbard Robinson, MD ED           Consults  intensivist  DVT Prophylaxis  Lovenox   Lab Results  Component Value Date   PLT 199 12/31/2016     Time Spent in minutes  67min Greater than 50% of time spent in care coordination and counseling patient regarding the condition and plan of care.     Nicholes Mango M.D on 01/02/2017 at 11:56 AM  Between 7am to 6pm -  Pager - [phone]  After 6pm go to [email or web address] - password [email or web address] [facility]   Office  [phone]

## 2017-01-02 NOTE — Progress Notes (Signed)
[facility]  ROUNDING NOTE   Subjective:   Held dialysis yesterday. UOP 1226.  IV albumin  Moved to 2c from ICU.   Objective:  Vital signs:   Physical Exam: General: Sitting on side of bed  Head: [name]/AT  Eyes: Anicteric, PERRL  Neck: obese  Lungs:  Crackles bilaterally, 3 L [name]  Heart: Regular rate and rhythm  Abdomen:  Soft, nontender, obese, +abdominal wall edema  Extremities: + peripheral edema.  Neurologic: Nonfocal, moving all four extremities  Skin: No lesions       Basic Metabolic Panel: Recent Labs  Lab 12/30/16 0705 12/31/16 0534 12/31/16 1603 01/01/17 0430 01/02/17 0326  NA 137 135 136 137 135  K 5.0 4.3 4.1 4.0 4.3  CL 109 109 106 107 105  CO2 17* 16* 18* 19* 19*  GLUCOSE 183* 170* 158* 131* 157*  BUN 75* 94* 92* 91* 107*  CREATININE 4.73* 5.46* 5.41* 4.95* 5.47*  CALCIUM 8.2* 7.4* 7.3* 7.3* 7.4*  PHOS  --   --  9.0*  --   --     Liver Function Tests: Recent Labs  Lab 12/31/16 1603  AST 15  ALT 29  ALKPHOS 126  BILITOT 0.4  PROT 6.7  ALBUMIN 2.7*   No results for input(s): LIPASE, AMYLASE in the last 168 hours. No results for input(s): AMMONIA in the last 168 hours.  CBC: Recent Labs  Lab 12/29/16 1604 12/30/16 0417 12/30/16 0705 12/31/16 1603  WBC 10.4 12.0* 12.2* 9.8  HGB 9.9* 10.3* 10.3* 9.0*  HCT 30.5* 31.7* 31.5* 27.4*  MCV 92.9 91.9 92.3 91.4  PLT 207 214 194 199    Microbiology: Results for orders placed or performed during the hospital encounter of 12/29/16  MRSA PCR Screening     Status: None   Collection Time: 12/30/16 12:36 AM  Result Value Ref Range Status   MRSA by PCR NEGATIVE NEGATIVE Final    Comment:        The GeneXpert MRSA Assay (FDA approved for NASAL specimens only), is one component of a comprehensive MRSA colonization surveillance program. It is not intended to diagnose MRSA infection nor to guide or monitor treatment for MRSA infections.      Assessment/ Plan:  Brian [name] is a  56 y.o. white male  with diabetes mellitus type II insulin dependent, diabetic peripheral neuropathy, right toe amputation, hypertension, hyperlipidemia, hypogonadism, COPD/tobacco use, depression   1. Acute Renal Failure on chronic kidney disease stage IV with proteinuria, metabolic acidosis and hyperkalemia: baseline creatinine 2.55, GFR of 26 on 10/14 Nephrotic range proteinuria Chronic kidney disease secondary to diabetes and hypertension. Unable to tolerate ACE-I/ARB in past due to hyperkalemia Acute renal failure is concerning for acute cardiorenal syndrome. Concern for progression of disease to Stage V CKD - Discontinue foley - First hemodialysis on 11/9. Tolerated treatment well. Nonoliguric urine output. Held dialysis over weekend monitoring for recovery. But with increasing BUN, creatinine, worsening edema and metabolic acidosis Concern that patient may need further dialysis and this is progression of kidney disease.  - Consult vascular surgery for tunneled catheter placement. Next dialysis treatment scheduled for Monday.  - Start outpatient dialysis planning.   2. Hypotension with systolic congestive heart failure: echo with preserved systolic function. Suspect underlying diastolic congestive heart failure.  - restart furosemide IV  3. Acute exacerbation of COPD and OSA: with hypercarbic hypoxic respiratory failure.    4. Diabetes mellitus type II with chronic kidney disease: insulin dependent. History of poor  control. Hemoglobin A1c 11.5% on 1/5.    LOS: Brian [name], Brian [name] 11/11/201812:37 PM

## 2017-01-03 ENCOUNTER — Encounter
Admission: EM | Disposition: A | Discharge status: Home Health | Payer: Self-pay | Source: Home / Self Care | Attending: Internal Medicine

## 2017-01-03 ENCOUNTER — Inpatient Hospital Stay (HOSPITAL_COMMUNITY): Payer: Medicaid Other

## 2017-01-03 ENCOUNTER — Inpatient Hospital Stay: Payer: Medicaid Other

## 2017-01-03 DIAGNOSIS — N186 End stage renal disease: Secondary | ICD-10-CM | POA: Diagnosis not present

## 2017-01-03 HISTORY — PX: DIALYSIS/PERMA CATHETER INSERTION: CATH118288

## 2017-01-03 LAB — RENAL FUNCTION PANEL
ALBUMIN: 3.3 g/dL — AB (ref 3.5–5.0)
ANION GAP: 13 (ref 5–15)
BUN: 121 mg/dL — ABNORMAL HIGH (ref 6–20)
CO2: 18 mmol/L — ABNORMAL LOW (ref 22–32)
CREATININE: 5.96 mg/dL — AB (ref 0.61–1.24)
Calcium: 7.9 mg/dL — ABNORMAL LOW (ref 8.9–10.3)
Chloride: 105 mmol/L (ref 101–111)
GFR calc non Af Amer: 9 mL/min — ABNORMAL LOW (ref >60)
GFR, EST AFRICAN AMERICAN: 11 mL/min — AB (ref >60)
GLUCOSE: 107 mg/dL — AB (ref 65–99)
PHOSPHORUS: 10.5 mg/dL — AB (ref 2.5–4.6)
Potassium: 4.2 mmol/L (ref 3.5–5.1)
SODIUM: 136 mmol/L (ref 135–145)

## 2017-01-03 LAB — GLUCOSE, CAPILLARY
GLUCOSE-CAPILLARY: 105 mg/dL — AB (ref 65–99)
GLUCOSE-CAPILLARY: 105 mg/dL — AB (ref 65–99)
GLUCOSE-CAPILLARY: 108 mg/dL — AB (ref 65–99)
GLUCOSE-CAPILLARY: 82 mg/dL (ref 65–99)
Glucose-Capillary: 101 mg/dL — ABNORMAL HIGH (ref 65–99)

## 2017-01-03 SURGERY — DIALYSIS/PERMA CATHETER INSERTION
Anesthesia: Moderate Sedation

## 2017-01-03 MED ORDER — MIDAZOLAM HCL 5 MG/5ML IJ SOLN
INTRAMUSCULAR | Status: AC
  Filled 2017-01-03: qty 5

## 2017-01-03 MED ORDER — LIDOCAINE-EPINEPHRINE (PF) 1 %-1:200000 IJ SOLN
INTRAMUSCULAR | Status: AC
  Filled 2017-01-03: qty 30

## 2017-01-03 MED ORDER — RENA-VITE PO TABS
1.0000 | ORAL_TABLET | Freq: Every day | ORAL | Status: DC
  Administered 2017-01-03 – 2017-01-05 (×3): 1 via ORAL
  Filled 2017-01-03 (×3): qty 1

## 2017-01-03 MED ORDER — HEPARIN SODIUM (PORCINE) 10000 UNIT/ML IJ SOLN
INTRAMUSCULAR | Status: AC
  Filled 2017-01-03: qty 1

## 2017-01-03 MED ORDER — LIDOCAINE-EPINEPHRINE (PF) 1 %-1:200000 IJ SOLN
INTRAMUSCULAR | Status: DC | PRN
  Administered 2017-01-03: 20 mL via INTRADERMAL

## 2017-01-03 MED ORDER — DEXTROSE 5 % IV SOLN: 1.5000 g | INTRAVENOUS | Status: DC

## 2017-01-03 MED ORDER — SODIUM CHLORIDE 0.9 % IV SOLN
INTRAVENOUS | Status: DC
  Administered 2017-01-03: 11:00:00 via INTRAVENOUS

## 2017-01-03 MED ORDER — MIDAZOLAM HCL 2 MG/2ML IJ SOLN
INTRAMUSCULAR | Status: DC | PRN
  Administered 2017-01-03: 1 mg via INTRAVENOUS

## 2017-01-03 MED ORDER — CEFAZOLIN SODIUM-DEXTROSE 2-4 GM/100ML-% IV SOLN
INTRAVENOUS | Status: AC
  Administered 2017-01-03: 13:00:00
  Filled 2017-01-03: qty 100

## 2017-01-03 MED ORDER — FENTANYL CITRATE (PF) 100 MCG/2ML IJ SOLN
INTRAMUSCULAR | Status: AC
  Filled 2017-01-03: qty 2

## 2017-01-03 MED ORDER — HEPARIN (PORCINE) IN NACL 2-0.9 UNIT/ML-% IJ SOLN
INTRAMUSCULAR | Status: AC
  Filled 2017-01-03: qty 500

## 2017-01-03 MED ORDER — FENTANYL CITRATE (PF) 100 MCG/2ML IJ SOLN
INTRAMUSCULAR | Status: DC | PRN
  Administered 2017-01-03: 50 ug via INTRAVENOUS

## 2017-01-03 MED ORDER — GABAPENTIN 300 MG PO CAPS
300.0000 mg | ORAL_CAPSULE | Freq: Three times a day (TID) | ORAL | Status: DC
  Administered 2017-01-03 – 2017-01-06 (×6): 300 mg via ORAL
  Filled 2017-01-03 (×6): qty 1

## 2017-01-03 MED ORDER — TUBERCULIN PPD 5 UNIT/0.1ML ID SOLN
5.0000 [IU] | Freq: Once | INTRADERMAL | Status: AC
  Administered 2017-01-03: 5 [IU] via INTRADERMAL
  Filled 2017-01-03: qty 0.1

## 2017-01-03 MED ORDER — CEFAZOLIN SODIUM-DEXTROSE 2-4 GM/100ML-% IV SOLN
2.0000 g | Freq: Once | INTRAVENOUS | Status: AC
  Administered 2017-01-03: 2 g via INTRAVENOUS

## 2017-01-03 MED ORDER — NEPRO/CARBSTEADY PO LIQD
237.0000 mL | Freq: Two times a day (BID) | ORAL | Status: DC
  Administered 2017-01-04: 237 mL via ORAL

## 2017-01-03 SURGICAL SUPPLY — 7 items
CATH PALINDROME RT-P 15FX23CM (CATHETERS) ×3 IMPLANT
DERMABOND ADVANCED (GAUZE/BANDAGES/DRESSINGS) ×2
DERMABOND ADVANCED .7 DNX12 (GAUZE/BANDAGES/DRESSINGS) ×1 IMPLANT
GUIDEWIRE SUPER STIFF .035X180 (WIRE) ×3 IMPLANT
PACK ANGIOGRAPHY (CUSTOM PROCEDURE TRAY) ×3 IMPLANT
SUT MNCRL AB 4-0 PS2 18 (SUTURE) ×3 IMPLANT
SUT PROLENE 0 CT 1 30 (SUTURE) ×3 IMPLANT

## 2017-01-03 NOTE — Progress Notes (Signed)
HD tx end  

## 2017-01-03 NOTE — Progress Notes (Signed)
Prime doc paged, pt states does not need to void, bladder scanned 89cc's urine, md paged, 40 lasix given as previously ordered.

## 2017-01-03 NOTE — Progress Notes (Signed)
[name] Cancellation Note  Patient Details Name: Brian [name] MRN: [id number] DOB: 07-21-1960   Cancelled Treatment:    Reason Eval/Treat Not Completed: Patient at procedure or test/unavailable. Chart reviewed. Attempted to call RN but no answer. Attempted to see patient however out of the room at this time. Will attempt [name] evaluation on later date/time as [name] is available.  Brian [name], Brian [name]   Brian [name] 01/03/2017, 11:00 AM

## 2017-01-03 NOTE — Progress Notes (Signed)
PT Cancellation Note  Patient Details Name: Brian [name] MRN: [id number] DOB: Jun 22, 1960   Cancelled Treatment:    Reason Eval/Treat Not Completed: Patient at procedure or test/unavailable(Currently at ultrasound).  Will continue to follow pt acutely.   Collie Siad PT, DPT 01/03/2017, 2:25 PM

## 2017-01-03 NOTE — Progress Notes (Signed)
Pre HD assessment  

## 2017-01-03 NOTE — Progress Notes (Signed)
Post HD assessment  

## 2017-01-03 NOTE — Progress Notes (Signed)
MEDICATION RELATED CONSULT NOTE - ELECTROLYTE   Pharmacy Consult for electrolytes Labs: Sodium (mmol/L)  Date Value  01/03/2017 136  11/05/2012 136   Potassium (mmol/L)  Date Value  01/03/2017 4.2  11/05/2012 3.7   Magnesium (mg/dL)  Date Value  02/08/2016 1.9   Phosphorus (mg/dL)  Date Value  01/03/2017 10.5 (H)   Calcium (mg/dL)  Date Value  01/03/2017 7.9 (L)   Calcium, Total (mg/dL)  Date Value  11/05/2012 8.3 (L)   Albumin (g/dL)  Date Value  01/03/2017 3.3 (L)  11/03/2012 2.5 (L)    Estimated Creatinine Clearance: 20.8 mL/min (A) (by C-G formula based on SCr of 5.96 mg/dL (H)).  Assessment: 56 yo male admitted with acute respiratory failure. Pharmacy has been consulted to monitor and adjust electrolytes.   Plan:  Patient's potassium has been WNL for 6 days. Phosphorus is elevated, but nephrology following. Pharmacy to sign off at this time. No supplementation needed at this time.  Lenis Noon, PharmD 01/03/17 7:34 AM

## 2017-01-03 NOTE — Op Note (Signed)
OPERATIVE NOTE    PRE-OPERATIVE DIAGNOSIS: 1. ESRD 2. Morbid obesity  POST-OPERATIVE DIAGNOSIS: same as above  PROCEDURE: 1. Ultrasound guidance for vascular access to the right internal jugular vein 2. Fluoroscopic guidance for placement of catheter 3. Placement of a 23 cm tip to cuff tunneled hemodialysis catheter via the right internal jugular vein  SURGEON: Leotis Pain, MD  ANESTHESIA:  Local with Moderate conscious sedation for approximately 15 minutes using 1 mg of Versed and 50 mcg of Fentanyl  ESTIMATED BLOOD LOSS: 20 cc  FLUORO TIME: less than one minute  CONTRAST: none  FINDING(S): 1.  Patent right internal jugular vein  SPECIMEN(S):  None  INDICATIONS:   Brian [name] is a 56 y.o.male who presents with renal failure.  The patient needs long term dialysis access for their ESRD, and a Permcath is necessary.  Risks and benefits are discussed and informed consent is obtained.    DESCRIPTION: After obtaining full informed written consent, the patient was brought back to the vascular suited. The patient's right neck and chest were sterilely prepped and draped in a sterile surgical field was created. Moderate conscious sedation was administered during a face to face encounter with the patient throughout the procedure with my supervision of the RN administering medicines and monitoring the patient's vital signs, pulse oximetry, telemetry and mental status throughout from the start of the procedure until the patient was taken to the recovery room.  We had initially prepped and plan to rewire his existing temporary dialysis catheter in the right jugular vein.  This was accessed far too cephalad to reasonably rewire this and not expected to be dramatically kinked.  I elected to just remove this catheter and hold pressure for several minutes.  I then evaluated the site 3-4 cm closer to the clavicle to make a much more reasonable PermCath course.  The right internal jugular vein was  visualized with ultrasound and found to be patent. It was then accessed under direct ultrasound guidance and a permanent image was recorded. A wire was placed. After skin nick and dilatation, the peel-away sheath was placed over the wire. I then turned my attention to an area under the clavicle. Approximately 1-2 fingerbreadths below the clavicle a small counterincision was created and tunneled from the subclavicular incision to the access site. Using fluoroscopic guidance, a 23 centimeter tip to cuff tunneled hemodialysis catheter was selected, and tunneled from the subclavicular incision to the access site. It was then placed through the peel-away sheath and the peel-away sheath was removed. Using fluoroscopic guidance the catheter tips were parked in the right atrium. The appropriate distal connectors were placed. It withdrew blood well and flushed easily with heparinized saline and a concentrated heparin solution was then placed. It was secured to the chest wall with 2 Prolene sutures. The access incision was closed single 4-0 Monocryl. A 4-0 Monocryl pursestring suture was placed around the exit site. Sterile dressings were placed. The patient tolerated the procedure well and was taken to the recovery room in stable condition.  COMPLICATIONS: None  CONDITION: Stable  Leotis Pain, MD 01/03/2017 11:47 AM   This note was created with Dragon Medical transcription system. Any errors in dictation are purely unintentional.

## 2017-01-03 NOTE — Progress Notes (Signed)
HD tx start 

## 2017-01-03 NOTE — Progress Notes (Addendum)
Brian [name] at [facility]                                                                                                                                                                                  Patient Demographics   Brian [name], is a 56 y.o. male, DOB - 1960/05/20, OMV:[id number]  Admit date - 12/29/2016   Admitting Physician Lance Coon, MD  Outpatient Primary MD for the patient is Lorelee Market, MD   LOS - 5  Subjective: Patient shortness of breath better while resting   Review of Systems:   CONSTITUTIONAL: No documented fever. No fatigue, weakness. No weight gain, no weight loss.  EYES: No blurry or double vision.  ENT: No tinnitus. No postnasal drip. No redness of the oropharynx.  RESPIRATORY: No cough, no wheeze, no hemoptysis.  Positive dyspnea.  CARDIOVASCULAR: No chest pain. No orthopnea. No palpitations. No syncope.  GASTROINTESTINAL: No nausea, no vomiting or diarrhea. No abdominal pain. No melena or hematochezia.  GENITOURINARY: No dysuria or hematuria.  ENDOCRINE: No polyuria or nocturia. No heat or cold intolerance.  HEMATOLOGY: No anemia. No bruising. No bleeding.  INTEGUMENTARY: No rashes. No lesions.  MUSCULOSKELETAL: No arthritis. No swelling. No gout.  NEUROLOGIC: No numbness, tingling, or ataxia. No seizure-type activity.  PSYCHIATRIC: No anxiety. No insomnia. No ADD.    Vitals:   Vitals:   01/03/17 1155 01/03/17 1200 01/03/17 1215 01/03/17 1230  BP: 115/69 118/75 117/79 121/72  Pulse: 63 63    Resp: 19 14 16 19   Temp:      TempSrc:      SpO2: 93% 93% 94% 94%  Weight:      Height:        Wt Readings from Last 3 Encounters:  01/03/17 (!) 159.8 kg (352 lb 6.4 oz)  11/29/16 (!) 155.7 kg (343 lb 4 oz)  10/14/16 (!) 154.8 kg (341 lb 4 oz)     Intake/Output Summary (Last 24 hours) at 01/03/2017 1305 Last data filed at 01/03/2017 1249 Gross per 24 hour  Intake 840 ml  Output 400 ml  Net 440 ml     Physical Exam:   GENERAL: Pleasant-appearing in no apparent distress.  Morbidly obese HEAD, EYES, EARS, NOSE AND THROAT: Atraumatic, normocephalic. Extraocular muscles are intact. Pupils equal and reactive to light. Sclerae anicteric. No conjunctival injection. No oro-pharyngeal erythema.  NECK: Supple. There is no jugular venous distention. No bruits, no lymphadenopathy, no thyromegaly.  Right IJ permanent hemodialysis catheter placed HEART: Regular rate and rhythm,. No murmurs, no rubs, no clicks.  LUNGS: Clear to auscultation bilaterally. No rales or rhonchi. No wheezes.  ABDOMEN: Soft, flat, nontender, nondistended. Has  good bowel sounds. No hepatosplenomegaly appreciated.  EXTREMITIES: No evidence of any cyanosis, clubbing, or 2+ pitting peripheral edema.  +2 pedal and radial pulses bilaterally.  NEUROLOGIC: The patient is alert, awake, and oriented x3 with no focal motor or sensory deficits appreciated bilaterally.  SKIN: Moist and warm with no rashes appreciated.  Psych: Not anxious, depressed LN: No inguinal LN enlargement    Antibiotics   Anti-infectives (From admission, onward)   Start     Dose/Rate Route Frequency Ordered Stop   01/03/17 1100  ceFAZolin (ANCEF) IVPB 2g/100 mL premix     2 g 200 mL/hr over 30 Minutes Intravenous  Once 01/03/17 1051 01/03/17 1201   01/03/17 1058  ceFAZolin (ANCEF) 2-4 GM/100ML-% IVPB    Comments:  Carlynn Spry   : cabinet override      01/03/17 1058 01/03/17 2259   01/03/17 1051  cefUROXime (ZINACEF) 1.5 g in dextrose 5 % 50 mL IVPB  Status:  Discontinued     1.5 g 100 mL/hr over 30 Minutes Intravenous 30 min pre-op 01/03/17 1051 01/03/17 1053      Medications   Scheduled Meds: . arformoterol  15 mcg Nebulization BID  . aspirin EC  81 mg Oral Daily  . atorvastatin  80 mg Oral Daily  . carvedilol  25 mg Oral BID WC  . chlorhexidine  15 mL Mouth Rinse BID  . furosemide  40 mg Intravenous Q12H  . gabapentin  300 mg Oral TID   . heparin subcutaneous  5,000 Units Subcutaneous Q8H  . Influenza vac split quadrivalent PF  0.5 mL Intramuscular Tomorrow-1000  . insulin aspart  0-15 Units Subcutaneous Q4H  . levothyroxine  50 mcg Oral QAC breakfast  . mouth rinse  15 mL Mouth Rinse q12n4p  . sertraline  25 mg Oral Daily  . tuberculin  5 Units Intradermal Once  . umeclidinium bromide  1 puff Inhalation Daily   Continuous Infusions: . albumin human    . ceFAZolin     PRN Meds:.acetaminophen **OR** acetaminophen, ipratropium-albuterol, ondansetron **OR** ondansetron (ZOFRAN) IV, oxyCODONE-acetaminophen   Data Review:   Micro Results Recent Results (from the past 240 hour(s))  MRSA PCR Screening     Status: None   Collection Time: 12/30/16 12:36 AM  Result Value Ref Range Status   MRSA by PCR NEGATIVE NEGATIVE Final    Comment:        The GeneXpert MRSA Assay (FDA approved for NASAL specimens only), is one component of a comprehensive MRSA colonization surveillance program. It is not intended to diagnose MRSA infection nor to guide or monitor treatment for MRSA infections.     Radiology Reports Dg Chest Port 1 View  Result Date: 12/31/2016 CLINICAL DATA:  Central line placement EXAM: PORTABLE CHEST 1 VIEW COMPARISON:  12/29/2016 FINDINGS: Right jugular central venous catheter tip in the proximal SVC. No pneumothorax Progression of pulmonary vascular congestion and mild edema. No effusion. IMPRESSION: Central line placement in the proximal SVC.  No pneumothorax Progression of heart failure with mild edema. Electronically Signed   By: Franchot Gallo M.D.   On: 12/31/2016 15:01   Dg Chest Portable 1 View  Result Date: 12/29/2016 CLINICAL DATA:  Initial evaluation for acute shortness of breath. History of CHF, COPD, hypertension. EXAM: PORTABLE CHEST 1 VIEW COMPARISON:  Prior radiograph from 02/26/2016. FINDINGS: Moderate cardiomegaly, stable. Apparent widening of the mediastinum favored to be related to AP  technique, shallow lung inflation, and lordotic angulation. Lungs normally inflated. Diffuse pulmonary vascular and  interstitial congestion without frank pulmonary edema. Underlying COPD. No focal infiltrates. No appreciable pleural effusion. Mild right basilar subsegmental atelectasis. No pneumothorax. No acute osseus abnormality. IMPRESSION: 1. Cardiomegaly with mild diffuse pulmonary interstitial congestion without frank pulmonary edema. 2. Mild right basilar atelectasis. Electronically Signed   By: Jeannine Boga M.D.   On: 12/29/2016 17:14   Dg Hip Unilat W Or Wo Pelvis 2-3 Views Left  Result Date: 12/29/2016 CLINICAL DATA:  Initial evaluation for multiple recent falls, pain. EXAM: DG HIP (WITH OR WITHOUT PELVIS) 2-3V LEFT COMPARISON:  Prior radiograph from 07/12/2016. FINDINGS: Left hip in slight internal rotation. No acute fracture dislocation. Femoral head in normal alignment with the acetabulum. Heterotopic calcification adjacent to the greater trochanter is stable. Bony pelvis intact. SI joints approximated. Mild degenerative osteoarthrosis. Degenerative changes noted within lower lumbar spine. No acute soft tissue abnormality.  Atherosclerosis. IMPRESSION: 1. No acute osseous abnormality about the left hip. 2. Mild degenerative osteoarthrosis. Electronically Signed   By: Jeannine Boga M.D.   On: 12/29/2016 17:12   Dg Hip Unilat  With Pelvis 2-3 Views Right  Result Date: 12/29/2016 CLINICAL DATA:  Initial evaluation for multiple recent falls, acute right hip pain. EXAM: DG HIP (WITH OR WITHOUT PELVIS) 2-3V RIGHT COMPARISON:  None. FINDINGS: No acute fracture or dislocation. Femoral head in normal alignment with the acetabulum. Femoral head height preserved. Bony pelvis intact. SI joints approximated. Mild degenerative osteoarthrosis about the hips bilaterally. Degenerative changes noted within lower lumbar spine. No acute soft tissue abnormality.  Atherosclerosis noted. IMPRESSION: 1.  No acute osseous abnormality about the right hip. 2. Mild degenerative osteoarthrosis. Electronically Signed   By: Jeannine Boga M.D.   On: 12/29/2016 17:10     CBC Recent Labs  Lab 12/29/16 1604 12/30/16 0417 12/30/16 0705 12/31/16 1603 01/02/17 1500  WBC 10.4 12.0* 12.2* 9.8 9.9  HGB 9.9* 10.3* 10.3* 9.0* 8.8*  HCT 30.5* 31.7* 31.5* 27.4* 26.5*  PLT 207 214 194 199 171  MCV 92.9 91.9 92.3 91.4 90.8  MCH 30.1 29.8 30.0 30.0 30.3  MCHC 32.4 32.4 32.5 32.8 33.4  RDW 15.6* 15.6* 15.4* 15.5* 14.7*    Chemistries  Recent Labs  Lab 12/31/16 0534 12/31/16 1603 01/01/17 0430 01/02/17 0326 01/03/17 0258  NA 135 136 137 135 136  K 4.3 4.1 4.0 4.3 4.2  CL 109 106 107 105 105  CO2 16* 18* 19* 19* 18*  GLUCOSE 170* 158* 131* 157* 107*  BUN 94* 92* 91* 107* 121*  CREATININE 5.46* 5.41* 4.95* 5.47* 5.96*  CALCIUM 7.4* 7.3* 7.3* 7.4* 7.9*  AST  --  15  --   --   --   ALT  --  29  --   --   --   ALKPHOS  --  126  --   --   --   BILITOT  --  0.4  --   --   --    ------------------------------------------------------------------------------------------------------------------ estimated creatinine clearance is 20.8 mL/min (A) (by C-G formula based on SCr of 5.96 mg/dL (H)). ------------------------------------------------------------------------------------------------------------------ Recent Labs    01/02/17 0327  HGBA1C 6.8*   ------------------------------------------------------------------------------------------------------------------ No results for input(s): CHOL, HDL, LDLCALC, TRIG, CHOLHDL, LDLDIRECT in the last 72 hours. ------------------------------------------------------------------------------------------------------------------ No results for input(s): TSH, T4TOTAL, T3FREE, THYROIDAB in the last 72 hours.  Invalid input(s): FREET3 ------------------------------------------------------------------------------------------------------------------ No results  for input(s): VITAMINB12, FOLATE, FERRITIN, TIBC, IRON, RETICCTPCT in the last 72 hours.  Coagulation profile Recent Labs  Lab 12/29/16 2031 01/02/17 1500  INR  1.09 1.12    No results for input(s): DDIMER in the last 72 hours.  Cardiac Enzymes Recent Labs  Lab 12/30/16 0152 12/30/16 0705 12/30/16 1307  TROPONINI 1.43* 1.74* 1.84*   ------------------------------------------------------------------------------------------------------------------ Invalid input(s): POCBNP    Assessment & Plan  Patient is a 56 year old presenting with shortness of breath    1. Acute respiratory failure with hypercapnia (HCC) -patient currently off BiPAP This is related to acute on chronic diastolic CHF Clinically improving  transfered to floor  01/01/2017 intermittent hemodialysis was done initially on 12/31/2016 but now he is requiring permanent dialysis nephrology  2. COPD (chronic obstructive pulmonary disease) (HCC) -does not appear to be COPD exasperation Continue supportive care and home inhalers, as needed nebs  3.  Elevated troponin possibly due to demand ischemia Has been seen by cardiology-Dr. Saralyn Pilar Echo without any significant wall motion abnormality   Heparin has been discontinued Treated with aspirin  4.  Acute on chronic renal failure stage IV with proteinuria nephrotic range This is due to hepatorenal syndrome Today vascular surgery removed temporary dialysis catheter and placed permanent dialysis catheter, patient will get hemodialysis today  IV albumin Discussed with nephrology  5.  Essential hypertension Continue Coreg  6.  Hypothyroidism continue Synthroid  7.  Depression continue Zoloft   Back to floor        Code Status Orders  (From admission, onward)        Start     Ordered   12/30/16 0043  Full code  Continuous     12/30/16 0042    Code Status History    Date Active Date Inactive Code Status Order ID Comments User Context   02/27/2016  00:23 02/28/2016 16:06 Full Code [id number]  Harvie Bridge, DO Inpatient   02/08/2016 00:28 02/09/2016 18:20 Full Code [id number]  Laverle Hobby, MD ED   01/06/2015 00:09 01/08/2015 21:38 Full Code [id number]  Hubbard Robinson, MD ED           Consults  intensivist, nephrology, vascular surgery  DVT Prophylaxis  Lovenox   Lab Results  Component Value Date   PLT 171 01/02/2017     Time Spent in minutes  28min Greater than 50% of time spent in care coordination and counseling patient regarding the condition and plan of care.     Nicholes Mango M.D on 01/03/2017 at 1:05 PM  Between 7am to 6pm - Pager - [phone]  After 6pm go to [email or web address] - password [email or web address] [facility]   Office  [phone]

## 2017-01-03 NOTE — Progress Notes (Signed)
[facility], [place] 01/03/17  Subjective:   Patient is doing fair Awaiting PermCath placement No acute shortness of breath Skipped breakfast this morning   Objective:  Vital signs in last 24 hours:  Temp:  [97.5 F (36.4 C)-98.1 F (36.7 C)] 97.5 F (36.4 C) (11/12 0514) Pulse Rate:  [61-66] 61 (11/12 0514) Resp:  [18-21] 20 (11/12 0514) BP: (99-117)/(51-55) 108/54 (11/12 0514) SpO2:  [97 %-99 %] 98 % (11/12 0736) Weight:  [159.8 kg (352 lb 6.4 oz)] 159.8 kg (352 lb 6.4 oz) (11/12 0514)  Weight change: -0.499 kg (-1.6 oz) Filed Weights   01/01/17 0500 01/02/17 0214 01/03/17 0514  Weight: (!) 160.1 kg (352 lb 15.3 oz) (!) 160.3 kg (353 lb 8 oz) (!) 159.8 kg (352 lb 6.4 oz)    Intake/Output:    Intake/Output Summary (Last 24 hours) at 01/03/2017 0935 Last data filed at 01/02/2017 1843 Gross per 24 hour  Intake 480 ml  Output 400 ml  Net 80 ml     Physical Exam: General:  Obese gentleman, laying in the bed, no acute distress, uremic breath  HEENT  anicteric  Neck  supple  Pulm/lungs  normal breathing effort  CVS/Heart  soft heart sounds, no rub  Abdomen:   Soft, obese  Extremities:  2+ pitting edema  Neurologic:  Somnolent but able to answer questions  Skin:  No acute rashes  Access:  Temporary dialysis catheter       Basic Metabolic Panel:  Recent Labs  Lab 12/31/16 0534 12/31/16 1603 01/01/17 0430 01/02/17 0326 01/03/17 0258  NA 135 136 137 135 136  K 4.3 4.1 4.0 4.3 4.2  CL 109 106 107 105 105  CO2 16* 18* 19* 19* 18*  GLUCOSE 170* 158* 131* 157* 107*  BUN 94* 92* 91* 107* 121*  CREATININE 5.46* 5.41* 4.95* 5.47* 5.96*  CALCIUM 7.4* 7.3* 7.3* 7.4* 7.9*  PHOS  --  9.0*  --   --  10.5*     CBC: Recent Labs  Lab 12/29/16 1604 12/30/16 0417 12/30/16 0705 12/31/16 1603 01/02/17 1500  WBC 10.4 12.0* 12.2* 9.8 9.9  HGB 9.9* 10.3* 10.3* 9.0* 8.8*  HCT 30.5* 31.7* 31.5* 27.4* 26.5*  MCV 92.9 91.9 92.3 91.4 90.8   PLT 207 214 194 199 171      Lab Results  Component Value Date   HEPBSAG Negative 12/31/2016   HEPBSAB Non Reactive 12/31/2016      Microbiology:  Recent Results (from the past 240 hour(s))  MRSA PCR Screening     Status: None   Collection Time: 12/30/16 12:36 AM  Result Value Ref Range Status   MRSA by PCR NEGATIVE NEGATIVE Final    Comment:        The GeneXpert MRSA Assay (FDA approved for NASAL specimens only), is one component of a comprehensive MRSA colonization surveillance program. It is not intended to diagnose MRSA infection nor to guide or monitor treatment for MRSA infections.     Coagulation Studies: Recent Labs    01/02/17 1500  LABPROT 14.3  INR 1.12    Urinalysis: No results for input(s): COLORURINE, LABSPEC, PHURINE, GLUCOSEU, HGBUR, BILIRUBINUR, KETONESUR, PROTEINUR, UROBILINOGEN, NITRITE, LEUKOCYTESUR in the last 72 hours.  Invalid input(s): APPERANCEUR    Imaging: No results found.   Medications:   . albumin human     . arformoterol  15 mcg Nebulization BID  . aspirin EC  81 mg Oral Daily  . atorvastatin  80 mg Oral Daily  .  carvedilol  25 mg Oral BID WC  . chlorhexidine  15 mL Mouth Rinse BID  . furosemide  40 mg Intravenous Q12H  . gabapentin  600 mg Oral TID  . heparin subcutaneous  5,000 Units Subcutaneous Q8H  . Influenza vac split quadrivalent PF  0.5 mL Intramuscular Tomorrow-1000  . insulin aspart  0-15 Units Subcutaneous Q4H  . levothyroxine  50 mcg Oral QAC breakfast  . mouth rinse  15 mL Mouth Rinse q12n4p  . sertraline  25 mg Oral Daily  . tuberculin  5 Units Intradermal Once  . umeclidinium bromide  1 puff Inhalation Daily   acetaminophen **OR** acetaminophen, ipratropium-albuterol, ondansetron **OR** ondansetron (ZOFRAN) IV, oxyCODONE-acetaminophen  Assessment/ Plan:  56 y.o.caucasian  male with diabetes mellitus type II insulin dependent, diabetic peripheral neuropathy, right toe amputation, hypertension,  hyperlipidemia, hypogonadism, COPD/tobacco use, depression   1.  Acute renal failure on chronic kidney disease stage IV.  First dialysis November 9 2.  Proteinuria 3.  Lower extremity edema 4.  Diabetes  type 2 with CKD.  Hemoglobin A1c 6.8% on November 11.  Plan: There is concern about patient having progressed to end-stage renal disease.  We will continue to monitor his electrolytes and volume status closely.  PermCath placement today.  Daily dialysis for correction of uremia Volume removal with dialysis as tolerated Currently continued on furosemide 40 mg every 12 hours Start discharge planning for outpatient dialysis unit.  PPD orders placed. Hepatitis studies available from November 9    LOS: 5 [facility] 11/12/20189:35 [place], [place]

## 2017-01-04 ENCOUNTER — Encounter: Payer: Self-pay | Admitting: Vascular Surgery

## 2017-01-04 LAB — BASIC METABOLIC PANEL
Anion gap: 15 (ref 5–15)
BUN: 92 mg/dL — AB (ref 6–20)
CHLORIDE: 104 mmol/L (ref 101–111)
CO2: 18 mmol/L — AB (ref 22–32)
Calcium: 8 mg/dL — ABNORMAL LOW (ref 8.9–10.3)
Creatinine, Ser: 4.79 mg/dL — ABNORMAL HIGH (ref 0.61–1.24)
GFR calc Af Amer: 14 mL/min — ABNORMAL LOW (ref >60)
GFR calc non Af Amer: 12 mL/min — ABNORMAL LOW (ref >60)
GLUCOSE: 94 mg/dL (ref 65–99)
POTASSIUM: 5.1 mmol/L (ref 3.5–5.1)
Sodium: 137 mmol/L (ref 135–145)

## 2017-01-04 LAB — GLUCOSE, CAPILLARY
GLUCOSE-CAPILLARY: 104 mg/dL — AB (ref 65–99)
GLUCOSE-CAPILLARY: 159 mg/dL — AB (ref 65–99)
GLUCOSE-CAPILLARY: 158 mg/dL — AB (ref 65–99)
Glucose-Capillary: 112 mg/dL — ABNORMAL HIGH (ref 65–99)
Glucose-Capillary: 96 mg/dL (ref 65–99)

## 2017-01-04 MED ORDER — TORSEMIDE 100 MG PO TABS
100.0000 mg | ORAL_TABLET | Freq: Every day | ORAL | Status: DC
  Administered 2017-01-04 – 2017-01-06 (×3): 100 mg via ORAL
  Filled 2017-01-04 (×3): qty 1

## 2017-01-04 NOTE — Progress Notes (Signed)
OT Cancellation Note  Patient Details Name: Brian [name] MRN: [id number] DOB: September 29, 1960   Cancelled Treatment:    Reason Eval/Treat Not Completed: Patient at procedure or test/ unavailable. Order received, chart reviewed. Pt unavailable (dialysis) for OT evaluation. Will re-attempt at later date/time as pt is available and as schedule permits.  Jeni Salles, MPH, MS, OTR/L ascom [phone] 01/04/17, 12:05 PM

## 2017-01-04 NOTE — Progress Notes (Signed)
Post HD assessment  

## 2017-01-04 NOTE — Evaluation (Signed)
Physical Therapy Evaluation Patient Details Name: Brian [name] MRN: [id number] DOB: [birth year] Today's Date: 01/04/2017   History of Present Illness  Pt is a 56 y/o M who presented with weakness, lethargic, and decreased responsiveness.  CO2 elevated to 60.  Pt with acute on chronic renal fialure. Pt was admuitted with diagnosis of acute respiratory failure with hypercapnia.  Pt initially in the ICU and transfered to the floor on 11/10.  Pt now with permcath.   Pt placed on BiPAP.  Pt's PMH includes COPD, CHF, morbid obesity, neuropathy, spinal stenosis.      Clinical Impression  Pt admitted with above diagnosis. Pt currently with functional limitations due to the deficits listed below (see PT Problem List). Brian [name] is quick to fatigue and presents with generalized weakness BUE and BLE.  He reports ~4-5 falls/wk PTA.  Pt currently requires min guard for transfers and to ambulate 5 ft around the bed, with a 5 minute seated rest break before able to ambulate 5 ft back around bed.  Pt limited by BLE fatigue and SOB.  SpO2 remains at or above 96% on 2L O2 throughout session.  Pt is at a high risk of falling.  Given pt's current mobility status, recommending SNF at d/c.  Pt will benefit from skilled PT to increase their independence and safety with mobility to allow discharge to the venue listed below.      Follow Up Recommendations SNF    Equipment Recommendations  Other (comment)(Bariatric RW)    Recommendations for Other Services OT consult(Energy conservation techniques)     Precautions / Restrictions Precautions Precautions: Fall;Other (comment) Precaution Comments: O2, permcath Restrictions Weight Bearing Restrictions: No      Mobility  Bed Mobility Overal bed mobility: Needs Assistance Bed Mobility: Supine to Sit     Supine to sit: Min guard;HOB elevated     General bed mobility comments: Pt relies heavily on bed rail with increased time and effort with one rest break  due to fatigue.    Transfers Overall transfer level: Needs assistance Equipment used: Rolling walker (2 wheeled) Transfers: Sit to/from Stand Sit to Stand: Min guard;From elevated surface         General transfer comment: Bed elevated as pt unable to stand from regular height.  Cues for proper hand placement and safe technique.  Poorly controlled descent to sit.  Pt performed sit<>stand from bed x2.   Ambulation/Gait Ambulation/Gait assistance: Min guard Ambulation Distance (Feet): 16 Feet(8x2) Assistive device: Rolling walker (2 wheeled) Gait Pattern/deviations: Shuffle;Wide base of support;Trunk flexed;Antalgic Gait velocity: decreased Gait velocity interpretation: Below normal speed for age/gender General Gait Details: Pt with flexed trunk and pushes RW too far ahead, some improvement with verbal cues.  Pt fatigues quickly and begins reaching out for bed rail for support, cues to keep hands on RW.  Pt ambulates around the bed x2 with ~5 minute seated rest break in between due to fatigue.  SpO2 remains at or above 96% on 2L O2 throughout session but pt with increased SOB with activity.   Stairs            Wheelchair Mobility    Modified Rankin (Stroke Patients Only)       Balance Overall balance assessment: Needs assistance;History of Falls Sitting-balance support: No upper extremity supported;Feet supported Sitting balance-Leahy Scale: Fair Sitting balance - Comments: Pt able to sit EOB without LOB but would likely lose his balance with perturbation   Standing balance support: Bilateral upper extremity supported;During functional  activity Standing balance-Leahy Scale: Poor Standing balance comment: Pt relies on UE support for static and dynamic activities                             Pertinent Vitals/Pain Pain Assessment: Faces Faces Pain Scale: Hurts little more Pain Location: permcath site Pain Descriptors / Indicators: Discomfort Pain  Intervention(s): Limited activity within patient's tolerance;Monitored during session    Home Living Family/patient expects to be discharged to:: Private residence Living Arrangements: Spouse/significant other Available Help at Discharge: Family;Available 24 hours/day Type of Home: Mobile home Home Access: Stairs to enter Entrance Stairs-Rails: Left;Right;Can reach both Entrance Stairs-Number of Steps: 6 Home Layout: One level Home Equipment: Walker - 2 wheels;Shower seat;Grab bars - tub/shower;Hand held shower head;Cane - single point;Transport chair      Prior Function Level of Independence: Independent with assistive device(s)         Comments: Pt has been ambulating mostly with SPC and using RW when he feels more weak.  He has ~4-5 falls/wk.  Pt independent with bathing, dressing.       Hand Dominance        Extremity/Trunk Assessment   Upper Extremity Assessment Upper Extremity Assessment: (BUE strength grossly 4/5)    Lower Extremity Assessment Lower Extremity Assessment: (BLE strength grossly 3/5)       Communication   Communication: No difficulties  Cognition Arousal/Alertness: Awake/alert Behavior During Therapy: WFL for tasks assessed/performed Overall Cognitive Status: Within Functional Limits for tasks assessed                                        General Comments      Exercises General Exercises - Lower Extremity Ankle Circles/Pumps: AROM;Both;10 reps;Supine Quad Sets: Strengthening;Both;10 reps;Supine Hip Flexion/Marching: Both;5 reps;Seated   Assessment/Plan    PT Assessment Patient needs continued PT services  PT Problem List Decreased strength;Decreased activity tolerance;Decreased balance;Decreased mobility;Decreased knowledge of use of DME;Decreased safety awareness;Cardiopulmonary status limiting activity;Obesity       PT Treatment Interventions DME instruction;Gait training;Stair training;Functional mobility  training;Therapeutic activities;Balance training;Therapeutic exercise;Neuromuscular re-education;Patient/family education    PT Goals (Current goals can be found in the Care Plan section)  Acute Rehab PT Goals Patient Stated Goal: rehab before home PT Goal Formulation: With patient Time For Goal Achievement: 01/18/17 Potential to Achieve Goals: Good    Frequency Min 2X/week   Barriers to discharge Inaccessible home environment;Decreased caregiver support 6 steps to enter home and wife able to provide very limited amount of assist as she is currently filing for disability    Co-evaluation               AM-PAC PT "6 Clicks" Daily Activity  Outcome Measure Difficulty turning over in bed (including adjusting bedclothes, sheets and blankets)?: Unable Difficulty moving from lying on back to sitting on the side of the bed? : Unable Difficulty sitting down on and standing up from a chair with arms (e.g., wheelchair, bedside commode, etc,.)?: A Lot Help needed moving to and from a bed to chair (including a wheelchair)?: A Little Help needed walking in hospital room?: A Lot Help needed climbing 3-5 steps with a railing? : Total 6 Click Score: 10    End of Session Equipment Utilized During Treatment: Gait belt;Oxygen Activity Tolerance: Patient limited by fatigue;Other (comment)(limited by SOB) Patient left: in bed;with call bell/phone within  reach;with bed alarm set Nurse Communication: Mobility status;Other (comment)(SpO2) PT Visit Diagnosis: Repeated falls (R29.6);History of falling (Z91.81);Muscle weakness (generalized) (M62.81);Unsteadiness on feet (R26.81);Other abnormalities of gait and mobility (R26.89)    Time: 5277-8242 PT Time Calculation (min) (ACUTE ONLY): 33 min   Charges:   PT Evaluation $PT Eval Moderate Complexity: 1 Mod PT Treatments $Therapeutic Activity: 8-22 mins   PT G Codes:   PT G-Codes **NOT FOR INPATIENT CLASS** Functional Assessment Tool Used: AM-PAC  6 Clicks Basic Mobility;Clinical judgement Functional Limitation: Mobility: Walking and moving around Mobility: Walking and Moving Around Current Status (P5361): At least 60 percent but less than 80 percent impaired, limited or restricted Mobility: Walking and Moving Around Goal Status [phone]): At least 20 percent but less than 40 percent impaired, limited or restricted    Collie Siad PT, DPT 01/04/2017, 10:17 AM

## 2017-01-04 NOTE — Progress Notes (Signed)
[facility] [place] NAME: Brian [name]    MR#:  [id number]  DATE OF BIRTH:  [birth year]  SUBJECTIVE:   breathing better this am  REVIEW OF SYSTEMS:    Review of Systems  Constitutional: Negative for fever, chills weight loss HENT: Negative for ear pain, nosebleeds, congestion, facial swelling, rhinorrhea, neck pain, neck stiffness and ear discharge.   Respiratory: Negative for cough, shortness of breath, wheezing  Cardiovascular: Negative for chest pain, palpitations and++ leg swelling.  Gastrointestinal: Negative for heartburn, abdominal pain, vomiting, diarrhea or consitpation Genitourinary: Negative for dysuria, urgency, frequency, hematuria Musculoskeletal: Negative for back pain or joint pain Neurological: Negative for dizziness, seizures, syncope, focal weakness,  numbness and headaches.  Hematological: Does not bruise/bleed easily.  Psychiatric/Behavioral: Negative for hallucinations, confusion, dysphoric mood    Tolerating Diet:yes      DRUG ALLERGIES:   Allergies  Allergen Reactions  . Naproxen Rash    VITALS:  Blood pressure (!) 141/90, pulse 62, temperature 99 F (37.2 C), temperature source Oral, resp. rate 14, height 5\' 10"  (1.778 m), weight (!) 160 kg (352 lb 11.8 oz), SpO2 100 %.  PHYSICAL EXAMINATION:  Constitutional: Appears well-developed and well-nourished. No distress. HENT: Normocephalic. Marland Kitchen Oropharynx is clear and moist.  Eyes: Conjunctivae and EOM are normal. PERRLA, no scleral icterus.  Neck: Normal ROM. Neck supple. No JVD. No tracheal deviation. CVS: RRR, S1/S2 +, no murmurs, no gallops, no carotid bruit.  Pulmonary: Effort and breath sounds normal, no stridor, rhonchi, wheezes, rales.  Abdominal: Soft. BS +,  no distension, tenderness, rebound or guarding.  Musculoskeletal: Normal range of motion. 3+LEE and no tenderness.  Neuro: Alert. CN 2-12 grossly intact. No focal deficits. Skin: Skin is  warm and dry. No rash noted. Psychiatric: Normal mood and affect.      LABORATORY PANEL:   CBC Recent Labs  Lab 01/02/17 1500  WBC 9.9  HGB 8.8*  HCT 26.5*  PLT 171   ------------------------------------------------------------------------------------------------------------------  Chemistries  Recent Labs  Lab 12/31/16 1603  01/04/17 0711  NA 136   < > 137  K 4.1   < > 5.1  CL 106   < > 104  CO2 18*   < > 18*  GLUCOSE 158*   < > 94  BUN 92*   < > 92*  CREATININE 5.41*   < > 4.79*  CALCIUM 7.3*   < > 8.0*  AST 15  --   --   ALT 29  --   --   ALKPHOS 126  --   --   BILITOT 0.4  --   --    < > = values in this interval not displayed.   ------------------------------------------------------------------------------------------------------------------  Cardiac Enzymes Recent Labs  Lab 12/30/16 0152 12/30/16 0705 12/30/16 1307  TROPONINI 1.43* 1.74* 1.84*   ------------------------------------------------------------------------------------------------------------------  RADIOLOGY:  No results found.   ASSESSMENT AND PLAN:   56 y/o male with COPD and CKD who presented with SOB   1. Acute respiratory failure with hypercapnia (HCC) due to acute on chronic diastolic CHF exacerbation with pEF and underlying renal disease with fluid overload. He is now off of Bipap and on [place]  2. COPD: No signs of exacerbation  3.  Elevated troponin due to demand ischemia and has ruled out for ACS He has been seen by cardiology-Dr. Saralyn Pilar Echo shows nml EF and no WMA.major valvular abnormalities.  4.Acute on chronic renal failure stage IV with proteinuria nephrotic range Patient  has progressed to endstage renal disease  He will have HD again today Continue torsemide Appreciate Renalc onsult Patient considering PD  5.  Essential hypertension Continue Coreg  6.  Hypothyroidism continue Synthroid  7.  Depression continue Zoloft  8. HLD: Continue  statin   9. Diabetes: Continue SSI Management plans discussed with the patient and he is in agreement.  CODE STATUS: full  TOTAL TIME TAKING CARE OF THIS PATIENT: 27 minutes.     POSSIBLE D/C 1-2 days, DEPENDING ON CLINICAL CONDITION.   Syriah Delisi M.D on 01/04/2017 at 12:43 PM  Between 7am to 6pm - Pager - [phone] After 6pm go to [email or web address] - password [email or web address] [facility]  Office  [phone]  CC: Primary care physician; Lorelee Market, MD  Note: This dictation was prepared with Dragon dictation along with smaller phrase technology. Any transcriptional errors that result from this process are unintentional.

## 2017-01-04 NOTE — Progress Notes (Signed)
HD RN report

## 2017-01-04 NOTE — Progress Notes (Signed)
HD tx end  

## 2017-01-04 NOTE — Progress Notes (Signed)
Pre HD assessment  

## 2017-01-04 NOTE — Progress Notes (Signed)
HD tx start 

## 2017-01-04 NOTE — Progress Notes (Signed)
[facility], [place] 01/04/17  Subjective:   Patient is doing fair this morning.  Sitting up in the side of the bed.  Ate 100% of his breakfast.  No nausea or vomiting.  No shortness of breath.  Wearing oxygen by nasal cannula.  He states that he uses it at home also. Continues to have large amount of edema in the legs  Objective:  Vital signs in last 24 hours:  Temp:  [98 F (36.7 C)-98.5 F (36.9 C)] 98.5 F (36.9 C) (11/13 0547) Pulse Rate:  [58-103] 62 (11/13 0839) Resp:  [12-19] 18 (11/12 2005) BP: (91-163)/(27-96) 117/56 (11/13 0839) SpO2:  [90 %-100 %] 92 % (11/13 0547) Weight:  [160.8 kg (354 lb 8 oz)-161.6 kg (356 lb 4.2 oz)] 160.8 kg (354 lb 8 oz) (11/12 2005)  Weight change: 1.752 kg (3 lb 13.8 oz) Filed Weights   01/03/17 0514 01/03/17 1655 01/03/17 2005  Weight: (!) 159.8 kg (352 lb 6.4 oz) (!) 161.6 kg (356 lb 4.2 oz) (!) 160.8 kg (354 lb 8 oz)    Intake/Output:    Intake/Output Summary (Last 24 hours) at 01/04/2017 0920 Last data filed at 01/04/2017 0600 Gross per 24 hour  Intake 410 ml  Output 1000 ml  Net -590 ml     Physical Exam: General:  Obese gentleman, sitting on the side of the bed, no acute distress,   HEENT  anicteric  Neck  supple  Pulm/lungs  normal breathing effort, clear to auscultation, decreased breath sounds in bases  CVS/Heart  regular rhythm, no rub  Abdomen:   Soft, obese  Extremities:  2+ pitting edema  Neurologic:  Alert and oriented  Skin:  No acute rashes  Access:  Right IJ PermCath       Basic Metabolic Panel:  Recent Labs  Lab 12/31/16 1603 01/01/17 0430 01/02/17 0326 01/03/17 0258 01/04/17 0711  NA 136 137 135 136 137  K 4.1 4.0 4.3 4.2 5.1  CL 106 107 105 105 104  CO2 18* 19* 19* 18* 18*  GLUCOSE 158* 131* 157* 107* 94  BUN 92* 91* 107* 121* 92*  CREATININE 5.41* 4.95* 5.47* 5.96* 4.79*  CALCIUM 7.3* 7.3* 7.4* 7.9* 8.0*  PHOS 9.0*  --   --  10.5*  --      CBC: Recent Labs   Lab 12/29/16 1604 12/30/16 0417 12/30/16 0705 12/31/16 1603 01/02/17 1500  WBC 10.4 12.0* 12.2* 9.8 9.9  HGB 9.9* 10.3* 10.3* 9.0* 8.8*  HCT 30.5* 31.7* 31.5* 27.4* 26.5*  MCV 92.9 91.9 92.3 91.4 90.8  PLT 207 214 194 199 171      Lab Results  Component Value Date   HEPBSAG Negative 12/31/2016   HEPBSAB Non Reactive 12/31/2016      Microbiology:  Recent Results (from the past 240 hour(s))  MRSA PCR Screening     Status: None   Collection Time: 12/30/16 12:36 AM  Result Value Ref Range Status   MRSA by PCR NEGATIVE NEGATIVE Final    Comment:        The GeneXpert MRSA Assay (FDA approved for NASAL specimens only), is one component of a comprehensive MRSA colonization surveillance program. It is not intended to diagnose MRSA infection nor to guide or monitor treatment for MRSA infections.     Coagulation Studies: Recent Labs    01/02/17 1500  LABPROT 14.3  INR 1.12    Urinalysis: No results for input(s): COLORURINE, LABSPEC, PHURINE, GLUCOSEU, HGBUR, BILIRUBINUR, KETONESUR, PROTEINUR, UROBILINOGEN, NITRITE,  LEUKOCYTESUR in the last 72 hours.  Invalid input(s): APPERANCEUR    Imaging: No results found.   Medications:   . albumin human     . arformoterol  15 mcg Nebulization BID  . aspirin EC  81 mg Oral Daily  . atorvastatin  80 mg Oral Daily  . carvedilol  25 mg Oral BID WC  . chlorhexidine  15 mL Mouth Rinse BID  . feeding supplement (NEPRO CARB STEADY)  237 mL Oral BID BM  . furosemide  40 mg Intravenous Q12H  . gabapentin  300 mg Oral TID  . heparin subcutaneous  5,000 Units Subcutaneous Q8H  . Influenza vac split quadrivalent PF  0.5 mL Intramuscular Tomorrow-1000  . insulin aspart  0-15 Units Subcutaneous Q4H  . levothyroxine  50 mcg Oral QAC breakfast  . mouth rinse  15 mL Mouth Rinse q12n4p  . multivitamin  1 tablet Oral QHS  . sertraline  25 mg Oral Daily  . tuberculin  5 Units Intradermal Once  . umeclidinium bromide  1 puff  Inhalation Daily   acetaminophen **OR** acetaminophen, ipratropium-albuterol, ondansetron **OR** ondansetron (ZOFRAN) IV, oxyCODONE-acetaminophen  Assessment/ Plan:  56 y.o.caucasian  male with diabetes mellitus type II insulin dependent, diabetic peripheral neuropathy, right toe amputation, hypertension, hyperlipidemia, hypogonadism, COPD/tobacco use, depression   1.  End-stage renal disease.  First dialysis November 9 2.  Proteinuria 3.  Lower extremity edema 4.  Diabetes  type 2 with CKD.  Hemoglobin A1c 6.8% on November 11.  Plan: There is concern about patient having progressed to end-stage renal disease.  Daily dialysis for correction of uremia.  Clinically, patient much improved compared to yesterday. Volume removal with dialysis as tolerated Start high-dose torsemide for volume control Discharge planning for outpatient dialysis unit. [place] [facility] PPD orders placed. Hepatitis studies available from November 9 Today, we had a preliminary discussion about home dialysis.  Patient has agreed to consider it.  We will get outpatient PD nurse to do home visit and proceed from there.   LOS: 6 [facility] 11/13/20189:20 AM  [place], [place]

## 2017-01-04 NOTE — Progress Notes (Signed)
Initial Nutrition Assessment  DOCUMENTATION CODES:   Morbid obesity  INTERVENTION:   Recommend check vitamin D levels as pt with elevated PTH  Nepro Shake po BID, each supplement provides 425 kcal and 19 grams protein  Renal MVI  Double protein with meals   NUTRITION DIAGNOSIS:   Increased nutrient needs related to chronic illness(ESRD on HD, morbid obesity ) as evidenced by increased estimated needs from protein.  GOAL:   Patient will meet greater than or equal to 90% of their needs  MONITOR:   PO intake, Supplement acceptance, Labs, Weight trends, I & O's  REASON FOR ASSESSMENT:   LOS    ASSESSMENT:   56 y.o.caucasian  male with diabetes mellitus type II insulin dependent, diabetic peripheral neuropathy, right toe amputation, hypertension, hyperlipidemia, hypogonadism, COPD/tobacco use, depression. Pt with ESRD and new HD   Met with pt in room today. Pt reports good appetite and oral intake pta and currently. Pt eating 100% of meals. Per chart, pt with wt gain likely r/t edema. RD discussed with pt the importance of adequate protein and nutrient intake while receiving dialysis. Pt with elevated phosphorus; will order Nepro and Renal MVI. Pt initiated on HD 11/9; permanent HD cath placed 11/12 (RT IJ cath). RD will order double protein with meals as pt with increased estimated needs r/t morbid obesity and HD.    Medications reviewed and include: aspirin, heparin, insulin, synthroid, albumin, oxycodone  Labs reviewed: K 5.1 wnl, BUN 92(H), creat 4.79(H), Ca 8.0(L) P 10.5(H)- 11/12 BNP- 547(H)- 11/7 PTH- 228(H)- 11/9 Hgb 8.8(L), Hct 26.5(L)  Nutrition-Focused physical exam completed. Findings are no fat depletion, no muscle depletion, and moderate generalized edema. Pt noted to have flag sign in his hair but reports this is normal for him.   Diet Order:  Diet renal/carb modified with fluid restriction Diet-HS Snack? Nothing; Room service appropriate? Yes; Fluid  consistency: Thin  EDUCATION NEEDS:   Education needs have been addressed  Skin:  Reviewed RN Assessment  Last BM:  11/13  Height:   Ht Readings from Last 1 Encounters:  12/29/16 '5\' 10"'  (1.778 m)    Weight:   Wt Readings from Last 1 Encounters:  01/04/17 (!) 352 lb 11.8 oz (160 kg)    Ideal Body Weight:  75.45 kg  BMI:  Body mass index is 50.61 kg/m.  Estimated Nutritional Needs:   Kcal:  2300-2600kcal/day   Protein:  >160g/day   Fluid:  per MD  Koleen Distance MS, RD, LDN Pager #[phone] After Hours Pager: [phone]

## 2017-01-05 LAB — BASIC METABOLIC PANEL
ANION GAP: 13 (ref 5–15)
BUN: 64 mg/dL — AB (ref 6–20)
CALCIUM: 7.7 mg/dL — AB (ref 8.9–10.3)
CO2: 26 mmol/L (ref 22–32)
Chloride: 96 mmol/L — ABNORMAL LOW (ref 101–111)
Creatinine, Ser: 3.68 mg/dL — ABNORMAL HIGH (ref 0.61–1.24)
GFR calc Af Amer: 20 mL/min — ABNORMAL LOW (ref >60)
GFR, EST NON AFRICAN AMERICAN: 17 mL/min — AB (ref >60)
GLUCOSE: 148 mg/dL — AB (ref 65–99)
Potassium: 3.4 mmol/L — ABNORMAL LOW (ref 3.5–5.1)
SODIUM: 135 mmol/L (ref 135–145)

## 2017-01-05 LAB — CBC
HCT: 26.5 % — ABNORMAL LOW (ref 40.0–52.0)
Hemoglobin: 8.7 g/dL — ABNORMAL LOW (ref 13.0–18.0)
MCH: 29.3 pg (ref 26.0–34.0)
MCHC: 32.8 g/dL (ref 32.0–36.0)
MCV: 89.4 fL (ref 80.0–100.0)
PLATELETS: 223 10*3/uL (ref 150–440)
RBC: 2.96 MIL/uL — AB (ref 4.40–5.90)
RDW: 14.6 % — AB (ref 11.5–14.5)
WBC: 11.2 10*3/uL — AB (ref 3.8–10.6)

## 2017-01-05 LAB — IRON AND TIBC
IRON: 36 ug/dL — AB (ref 45–182)
Saturation Ratios: 12 % — ABNORMAL LOW (ref 17.9–39.5)
TIBC: 304 ug/dL (ref 250–450)
UIBC: 268 ug/dL

## 2017-01-05 LAB — GLUCOSE, CAPILLARY
GLUCOSE-CAPILLARY: 139 mg/dL — AB (ref 65–99)
GLUCOSE-CAPILLARY: 140 mg/dL — AB (ref 65–99)
Glucose-Capillary: 106 mg/dL — ABNORMAL HIGH (ref 65–99)
Glucose-Capillary: 169 mg/dL — ABNORMAL HIGH (ref 65–99)
Glucose-Capillary: 150 mg/dL — ABNORMAL HIGH (ref 65–99)

## 2017-01-05 LAB — FERRITIN: FERRITIN: 77 ng/mL (ref 24–336)

## 2017-01-05 LAB — TRANSFERRIN: TRANSFERRIN: 207 mg/dL (ref 180–329)

## 2017-01-05 NOTE — Progress Notes (Signed)
[place], [place] 01/05/17  Subjective:   Patient is seen during dialysis.  He's tolerating it well sitting in the chair.  Continues to have some lower extremity edema.  No acute shortness of breath.  Still requiring nasal cannula oxygen. no reports of nausea or vomiting.   HEMODIALYSIS FLOWSHEET:  Blood Flow Rate (mL/min): 400 mL/min Arterial Pressure (mmHg): -200 mmHg Venous Pressure (mmHg): 140 mmHg Transmembrane Pressure (mmHg): 60 mmHg Ultrafiltration Rate (mL/min): 660 mL/min Dialysate Flow Rate (mL/min): 800 ml/min Conductivity: Machine : 13.9 Conductivity: Machine : 13.9 Dialysis Fluid Bolus: Normal Saline Bolus Amount (mL): 250 mL Dialysate Change: 2K    Objective:  Vital signs in last 24 hours:  Temp:  [98.3 F (36.8 C)-100 F (37.8 C)] 98.3 F (36.8 C) (11/14 1435) Pulse Rate:  [57-69] 63 (11/14 1435) Resp:  [13-21] 19 (11/14 1435) BP: (112-140)/(55-87) 131/57 (11/14 1435) SpO2:  [92 %-100 %] 92 % (11/14 1435) Weight:  [157.9 kg (348 lb 3.2 oz)] 157.9 kg (348 lb 3.2 oz) (11/14 0454)  Weight change: -1.6 kg (-8.4 oz) Filed Weights   01/04/17 1030 01/04/17 1406 01/05/17 0454  Weight: (!) 160 kg (352 lb 11.8 oz) (!) 157.5 kg (347 lb 3.6 oz) (!) 157.9 kg (348 lb 3.2 oz)    Intake/Output:    Intake/Output Summary (Last 24 hours) at 01/05/2017 1522 Last data filed at 01/05/2017 1326 Gross per 24 hour  Intake 360 ml  Output 1412 ml  Net -1052 ml     Physical Exam: General: NAD  HEENT  anicteric  Neck  supple  Pulm/lungs  normal breathing effort, clear to auscultation, decreased breath sounds in bases  CVS/Heart  regular rhythm, no rub  Abdomen:   Soft, obese  Extremities:  2+ pitting edema  Neurologic:  Alert and oriented  Skin:  No acute rashes  Access:  Right IJ PermCath       Basic Metabolic Panel:  Recent Labs  Lab 12/31/16 1603 01/01/17 0430 01/02/17 0326 01/03/17 0258 01/04/17 0711  NA 136 137 135  136 137  K 4.1 4.0 4.3 4.2 5.1  CL 106 107 105 105 104  CO2 18* 19* 19* 18* 18*  GLUCOSE 158* 131* 157* 107* 94  BUN 92* 91* 107* 121* 92*  CREATININE 5.41* 4.95* 5.47* 5.96* 4.79*  CALCIUM 7.3* 7.3* 7.4* 7.9* 8.0*  PHOS 9.0*  --   --  10.5*  --      CBC: Recent Labs  Lab 12/30/16 0417 12/30/16 0705 12/31/16 1603 01/02/17 1500 01/05/17 0853  WBC 12.0* 12.2* 9.8 9.9 11.2*  HGB 10.3* 10.3* 9.0* 8.8* 8.7*  HCT 31.7* 31.5* 27.4* 26.5* 26.5*  MCV 91.9 92.3 91.4 90.8 89.4  PLT 214 194 199 171 223      Lab Results  Component Value Date   HEPBSAG Negative 12/31/2016   HEPBSAB Non Reactive 12/31/2016      Microbiology:  Recent Results (from the past 240 hour(s))  MRSA PCR Screening     Status: None   Collection Time: 12/30/16 12:36 AM  Result Value Ref Range Status   MRSA by PCR NEGATIVE NEGATIVE Final    Comment:        The GeneXpert MRSA Assay (FDA approved for NASAL specimens only), is one component of a comprehensive MRSA colonization surveillance program. It is not intended to diagnose MRSA infection nor to guide or monitor treatment for MRSA infections.     Coagulation Studies: No results for input(s): LABPROT, INR in the  last 72 hours.  Urinalysis: No results for input(s): COLORURINE, LABSPEC, PHURINE, GLUCOSEU, HGBUR, BILIRUBINUR, KETONESUR, PROTEINUR, UROBILINOGEN, NITRITE, LEUKOCYTESUR in the last 72 hours.  Invalid input(s): APPERANCEUR    Imaging: [place] Ue Vein Mapping Left  Result Date: 01/05/2017 CLINICAL DATA:  End-stage renal disease EXAM: [place] EXTREM UP VEIN MAPPING COMPARISON:  None. FINDINGS: LEFT ARTERIES Wrist Radial Artery: Size 1.53mm  Waveform triphasic Wrist Ulnar Artery: Size 1.63mm  Waveform triphasic Prox. Forearm Radial Artery: Size 1.68mm  Waveform triphasic Upper Arm Brachial Artery: Size 5.60mm  Waveform triphasic LEFT VEINS Forearm Cephalic Vein: Prox 1.7EY Distal 2.18mm Depth 8.1-4.4YJ Forearm Basilic Vein: Prox diminutive Distal  diminutive Upper Arm Cephalic Vein: Prox 8.5UD Distal 4.55mm Depth 13.7 -4.80mm There is a segment of wall thickening without occlusion All Upper Arm Basilic Vein: Prox 42mm Distal 1.84mm Depth 15.3 -6.64mm Upper Arm Brachial Vein: Prox 6.33mm Distal 3.75mm Depth 0.2 0.1 -15.5mm ADDITIONAL LEFT VEINS Axillary Vein:  9.30mm Subclavian Vein: Patient: Yes Respiratory Phasicity: Present Internal Jugular Vein: Patent: Yes    Respiratory Phasicity: Present Branches > 2 mm: At least 1 from the cephalic vein in the upper arm, 3 mm At least 1 from the basilic vein, upper arm, 1.4 mm At least 4 from the brachial vein in the upper arm, 2.3-4.5 mm Note made of patent right subclavian and IJ veins with respiratory phasicity. IMPRESSION: Patent left upper extremity vasculature as detailed above. Electronically Signed   By: Lucrezia Europe M.D.   On: 01/05/2017 09:03     Medications:    . arformoterol  15 mcg Nebulization BID  . aspirin EC  81 mg Oral Daily  . atorvastatin  80 mg Oral Daily  . carvedilol  25 mg Oral BID WC  . chlorhexidine  15 mL Mouth Rinse BID  . feeding supplement (NEPRO CARB STEADY)  237 mL Oral BID BM  . gabapentin  300 mg Oral TID  . heparin subcutaneous  5,000 Units Subcutaneous Q8H  . insulin aspart  0-15 Units Subcutaneous Q4H  . levothyroxine  50 mcg Oral QAC breakfast  . mouth rinse  15 mL Mouth Rinse q12n4p  . multivitamin  1 tablet Oral QHS  . sertraline  25 mg Oral Daily  . torsemide  100 mg Oral Daily  . umeclidinium bromide  1 puff Inhalation Daily   acetaminophen **OR** acetaminophen, ipratropium-albuterol, ondansetron **OR** ondansetron (ZOFRAN) IV, oxyCODONE-acetaminophen  Assessment/ Plan:  56 y.o.caucasian  male with diabetes mellitus type II insulin dependent, diabetic peripheral neuropathy, right toe amputation, hypertension, hyperlipidemia, hypogonadism, COPD/tobacco use, depression   1.  End-stage renal disease.  First dialysis November 9 2.  Proteinuria 3.  Lower  extremity edema 4.  Diabetes  type 2 with CKD.  Hemoglobin A1c 6.8% on November 11.  Plan: There is concern about patient having progressed to end-stage renal disease.  Daily dialysis for correction of uremia.  Clinically, patient much improved   Volume removal with dialysis as tolerated continue high-dose torsemide for volume control Discharge planning for outpatient dialysis unit. [facility] Octave Mikeal Hawthorne PPD orders placed. Hepatitis studies available from November 9     LOS: 7 [facility] 11/14/20183:22 PM  [place], [place]

## 2017-01-05 NOTE — Progress Notes (Signed)
PT Cancellation Note  Patient Details Name: UBALDO DAYWALT MRN: [id number] DOB: [birth year]   Cancelled Treatment:    Reason Eval/Treat Not Completed: Patient at procedure or test/unavailable(Pt currently off floor for HD).  Will continue to follow acutely.   Collie Siad PT, DPT 01/05/2017, 10:44 AM

## 2017-01-05 NOTE — Progress Notes (Signed)
[facility] [place] NAME: Brian [name]    MR#:  [id number]  DATE OF BIRTH:  Feb 21, 1961  SUBJECTIVE:   Doing well this am Has decided on PD  SOB improved  REVIEW OF SYSTEMS:    Review of Systems  Constitutional: Negative for fever, chills weight loss HENT: Negative for ear pain, nosebleeds, congestion, facial swelling, rhinorrhea, neck pain, neck stiffness and ear discharge.   Respiratory: Negative for cough, shortness of breath, wheezing  Cardiovascular: Negative for chest pain, palpitations and++ leg swelling.  Gastrointestinal: Negative for heartburn, abdominal pain, vomiting, diarrhea or consitpation Genitourinary: Negative for dysuria, urgency, frequency, hematuria Musculoskeletal: Negative for back pain or joint pain Neurological: Negative for dizziness, seizures, syncope, focal weakness,  numbness and headaches.  Hematological: Does not bruise/bleed easily.  Psychiatric/Behavioral: Negative for hallucinations, confusion, dysphoric mood    Tolerating Diet:yes      DRUG ALLERGIES:   Allergies  Allergen Reactions  . Naproxen Rash    VITALS:  Blood pressure 138/67, pulse 63, temperature 98.8 F (37.1 C), temperature source Oral, resp. rate 15, height 5\' 10"  (1.778 m), weight (!) 157.9 kg (348 lb 3.2 oz), SpO2 94 %.  PHYSICAL EXAMINATION:  Constitutional: Appears well-developed and well-nourished. No distress. HENT: Normocephalic. Marland Kitchen Oropharynx is clear and moist.  Eyes: Conjunctivae and EOM are normal. PERRLA, no scleral icterus.  Neck: Normal ROM. Neck supple. No JVD. No tracheal deviation. CVS: RRR, S1/S2 +, no murmurs, no gallops, no carotid bruit.  Pulmonary: Effort and breath sounds normal, no stridor, rhonchi, wheezes, rales.  Abdominal: Soft. BS +,  no distension, tenderness, rebound or guarding.  Musculoskeletal: Normal range of motion. 3+LEE and no tenderness.  Neuro: Alert. CN 2-12 grossly intact. No focal  deficits. Skin: Skin is warm and dry. No rash noted. Psychiatric: Normal mood and affect.      LABORATORY PANEL:   CBC Recent Labs  Lab 01/02/17 1500  WBC 9.9  HGB 8.8*  HCT 26.5*  PLT 171   ------------------------------------------------------------------------------------------------------------------  Chemistries  Recent Labs  Lab 12/31/16 1603  01/04/17 0711  NA 136   < > 137  K 4.1   < > 5.1  CL 106   < > 104  CO2 18*   < > 18*  GLUCOSE 158*   < > 94  BUN 92*   < > 92*  CREATININE 5.41*   < > 4.79*  CALCIUM 7.3*   < > 8.0*  AST 15  --   --   ALT 29  --   --   ALKPHOS 126  --   --   BILITOT 0.4  --   --    < > = values in this interval not displayed.   ------------------------------------------------------------------------------------------------------------------  Cardiac Enzymes Recent Labs  Lab 12/30/16 0152 12/30/16 0705 12/30/16 1307  TROPONINI 1.43* 1.74* 1.84*   ------------------------------------------------------------------------------------------------------------------  RADIOLOGY:  No results found.   ASSESSMENT AND PLAN:   56 y/o male with COPD and CKD who presented with SOB   1. Acute respiratory failure with hypercapnia due to acute on chronic diastolic CHF exacerbation with pEF and underlying renal disease with fluid overload. He is now off of Bipap and weaned to Room air.  2. COPD: No signs of exacerbation  3.  Elevated troponin due to demand ischemia and has ruled out for ACS He has been seen by cardiology-Dr. Saralyn Pilar Echo shows nml EF and no WMA.major valvular abnormalities.  4.Acute on chronic renal failure  stage IV with proteinuria nephrotic range Patient has progressed to endstage renal disease  He will have HD again today and plan for outpatient PD Continue torsemide Appreciate Renal consult   5.  Essential hypertension Continue Coreg  6.  Hypothyroidism continue Synthroid  7.  Depression continue  Zoloft  8. HLD: Continue statin   9. Diabetes: Continue SSI Management plans discussed with the patient and he is in agreement.  CODE STATUS: full  TOTAL TIME TAKING CARE OF THIS PATIENT: 24 minutes.     POSSIBLE D/C 1-2 days, DEPENDING ON CLINICAL CONDITION.   Hriday Stai M.D on 01/05/2017 at 7:42 AM  Between 7am to 6pm - Pager - [phone] After 6pm go to [email or web address] - password [email or web address] [place] [facility]  Office  [phone]  CC: Primary care physician; Lorelee Market, MD  Note: This dictation was prepared with Dragon dictation along with smaller phrase technology. Any transcriptional errors that result from this process are unintentional.

## 2017-01-05 NOTE — Progress Notes (Signed)
Pre hd 

## 2017-01-05 NOTE — Progress Notes (Signed)
OT Cancellation Note  Patient Details Name: Brian [name] MRN: [id number] DOB: Aug 11, 1960   Cancelled Treatment:    Reason Eval/Treat Not Completed: Patient at procedure or test/ unavailable. Pt off floor for HD. Will re-attempt at later date/time as pt is available and as schedule permits.  Jeni Salles, MPH, MS, OTR/L ascom [phone] 01/05/17, 11:28 AM

## 2017-01-05 NOTE — Progress Notes (Addendum)
Inpatient Diabetes Program Recommendations  AACE/ADA: New Consensus Statement on Inpatient Glycemic Control (2015)  Target Ranges:  Prepandial:   less than 140 mg/dL      Peak postprandial:   less than 180 mg/dL (1-2 hours)      Critically ill patients:  140 - 180 mg/dL   Results for ERHARD, SENSKE (MRN [id number]) as of 01/05/2017 09:06  Ref. Range 01/04/2017 07:49 01/04/2017 16:43 01/04/2017 20:03 01/05/2017 00:26 01/05/2017 03:40 01/05/2017 07:34  Glucose-Capillary Latest Ref Range: 65 - 99 mg/dL 104 (H) 159 (H)  Novolog 3 units 158 (H)  Novolog 3 units 139 (H)  Novolog 2 units 140 (H)  Novolog 2 units 106 (H)   Results for ANKUR, SNOWDON (MRN [id number]) as of 01/05/2017 09:06  Ref. Range 02/27/2016 07:29 01/02/2017 03:27  Hemoglobin A1C Latest Ref Range: 4.8 - 5.6 % 11.5 (H) 6.8 (H)   Review of Glycemic Control  Diabetes history: DM2 Outpatient Diabetes medications: Lantus 25 units BID, Novolog 10 units TID with meals, Tradjenta 5 mg daily, Actos 45 mg daily Current orders for Inpatient glycemic control: Novolog 0-15 units Q4H  Inpatient Diabetes Program Recommendations: Correction (SSI): Please consider changing frequency of CBGs and Novolog to ACHS. Insulin - Basal: If CBGs and Novolog frequency changed to ACHS and if glucose is consistently greater than 180 mg/dl, please consider ordering Lantus 5 units Q24H. Outpatient: Since patient has CHF, recommend discontinuing outpatient Actos due to side effect of fluid retention.  Addendum 01/05/17@14 :30-Spoke with patient over phone regarding DM control and home regimen. Patient states that his PCP works with him to make changes with DM medication. Patient reports that he is taking Lantus 25 units BID, Novolog 10 units TID with meals, Tradjenta 5 mg daily and Actos 45 mg daily for DM control as an outpatient. Patient states that he was having some issues with hypoglycemia and he decreased his Lantus to 25 units BID which he  informed his PCP of at the last office visit. PCP told him to continue with decreased dose and see how glucose trended. Patient states that he checks his glucose at least 2 times per day and it is usually in the low 100's (averages around 120). Informed patient of A1C 6.8% on 01/02/17 which indicates an average glucose of 148 mg/dl. Discussed side effect of fluid retention with Actos and encouraged patient to discuss with MD since he has a documented history of CHF.  Patient verbalized understanding of information discussed and reports that he does not have any questions at this time.  Thanks, Barnie Alderman, RN, MSN, CDE Diabetes Coordinator Inpatient Diabetes Program [phone] (Team Pager from 8am to 5pm)

## 2017-01-05 NOTE — Progress Notes (Signed)
HD completed without issue. Goal met.  Tolerated dialysis very well in chair.

## 2017-01-05 NOTE — Progress Notes (Signed)
Post hd assessment unchanged  

## 2017-01-05 NOTE — Progress Notes (Signed)
HD initiated without issue via R Chest HD catheter. No heparin treatment. Labs sent per order. Patient dialyzing in chair today. Currently without complaints. Discussed dialysis and diet with patient.

## 2017-01-05 NOTE — Clinical Social Work Note (Signed)
Clinical Social Work Assessment  Patient Details  Name: Brian [name] MRN: [id number] Date of Birth: 01/02/1961  Date of referral:  01/05/17               Reason for consult:  Discharge Planning                Permission sought to share information with:    Permission granted to share information::     Name::        Agency::     Relationship::     Contact Information:     Housing/Transportation Living arrangements for the past 2 months:  Single Family Home Source of Information:  Patient Patient Interpreter Needed:  None Criminal Activity/Legal Involvement Pertinent to Current Situation/Hospitalization:  No - Comment as needed Significant Relationships:  Spouse Lives with:  Spouse Do you feel safe going back to the place where you live?  Yes Need for family participation in patient care:  Yes (Comment)  Care giving concerns:  Patient lives at home with spouse.   Social Worker assessment / plan:  CSW met with patient this afternoon to discuss PT recommendations and how his insurance plays a role in that. CSW was able to speak with patient long enough to get that the patient stated he feels returning home is best. When CSW began discussing how if he changed his mind how having medicaid would affect his bed search, his phone rang and he answered and continued to talk while CSW waited about 5 minutes before leaving and asking nurse to come get CSW when he had finished his conversation.  Employment status:  Disabled (Comment on whether or not currently receiving Disability) Insurance information:  Medicaid In [place] PT Recommendations:  [place] / Referral to community resources:     Patient/Family's Response to care:  Patient willing to speak with CSW but interrupted the assessment to take a call and continued to speak on the phone.  Patient/Family's Understanding of and Emotional Response to Diagnosis, Current Treatment, and Prognosis:  Patient somewhat  self absorbed this afternoon.  Emotional Assessment Appearance:  Appears older than stated age Attitude/Demeanor/Rapport:    Affect (typically observed):  Calm Orientation:  Oriented to Self, Oriented to Place, Oriented to  Time, Oriented to Situation Alcohol / Substance use:  Not Applicable Psych involvement (Current and /or in the community):  No (Comment)  Discharge Needs  Concerns to be addressed:  Care Coordination Readmission within the last 30 days:  No Current discharge risk:  None Barriers to Discharge:  No Barriers Identified   Shela Leff, LCSW 01/05/2017, 2:42 PM

## 2017-01-06 LAB — BASIC METABOLIC PANEL
Anion gap: 11 (ref 5–15)
BUN: 38 mg/dL — AB (ref 6–20)
CALCIUM: 8.1 mg/dL — AB (ref 8.9–10.3)
CHLORIDE: 97 mmol/L — AB (ref 101–111)
CO2: 29 mmol/L (ref 22–32)
CREATININE: 2.84 mg/dL — AB (ref 0.61–1.24)
GFR calc non Af Amer: 23 mL/min — ABNORMAL LOW (ref >60)
GFR, EST AFRICAN AMERICAN: 27 mL/min — AB (ref >60)
Glucose, Bld: 121 mg/dL — ABNORMAL HIGH (ref 65–99)
Potassium: 3 mmol/L — ABNORMAL LOW (ref 3.5–5.1)
SODIUM: 137 mmol/L (ref 135–145)

## 2017-01-06 LAB — GLUCOSE, CAPILLARY
GLUCOSE-CAPILLARY: 137 mg/dL — AB (ref 65–99)
GLUCOSE-CAPILLARY: 124 mg/dL — AB (ref 65–99)
Glucose-Capillary: 141 mg/dL — ABNORMAL HIGH (ref 65–99)
Glucose-Capillary: 147 mg/dL — ABNORMAL HIGH (ref 65–99)

## 2017-01-06 MED ORDER — NEPRO/CARBSTEADY PO LIQD: 237.0000 mL | Freq: Two times a day (BID) | ORAL | 0 refills | Status: AC

## 2017-01-06 MED ORDER — SPIRONOLACTONE 25 MG PO TABS
25.0000 mg | ORAL_TABLET | Freq: Every day | ORAL | Status: DC
  Administered 2017-01-06: 25 mg via ORAL
  Filled 2017-01-06: qty 1

## 2017-01-06 MED ORDER — POTASSIUM CHLORIDE CRYS ER 20 MEQ PO TBCR
40.0000 meq | EXTENDED_RELEASE_TABLET | Freq: Once | ORAL | Status: AC
  Administered 2017-01-06: 40 meq via ORAL
  Filled 2017-01-06: qty 2

## 2017-01-06 MED ORDER — GABAPENTIN 300 MG PO CAPS: 300.0000 mg | ORAL_CAPSULE | Freq: Three times a day (TID) | ORAL | 0 refills | Status: DC

## 2017-01-06 MED ORDER — TORSEMIDE 100 MG PO TABS: 100.0000 mg | ORAL_TABLET | Freq: Every day | ORAL | 0 refills | Status: DC

## 2017-01-06 NOTE — Progress Notes (Addendum)
     SATURATION QUALIFICATIONS: (This note is used to comply with regulatory documentation for home oxygen)  Patient Saturations on Room Air at Rest =93 %   Patient Saturations on Room Air while Ambulating = 95%  Patient Saturations on __1L__ Liters of oxygen while Ambulating =  98%  Please briefly explain why patient needs home oxygen:

## 2017-01-06 NOTE — Plan of Care (Signed)
Patient continues to have shortness of breath with exertion. Patient is not using Bipap at night as instructed- he takes it off for a significant amt of time. Patient continues to have chronic back pain - Percocet every 6 hours prn.

## 2017-01-06 NOTE — Care Management (Signed)
Patient to discharge home today.  Patient declining SNF and wishes to return home.  Patient agreeable to home health services, and does not have a preference on agency.   Referral made to [place] with [facility].   bariatric walker delivered to room prior to discharge, by [facility] with [place]. [name] HD liaison notified of discharge.  Dr Candiss Norse has given approval for patient to start on Tuesday.  RNCM signing off.

## 2017-01-06 NOTE — Progress Notes (Signed)
   01/05/17 2004  PPD Results  Does patient have an induration at the injection site? No

## 2017-01-06 NOTE — Progress Notes (Signed)
Discharge order received. Patient is alert and oriented. Vital signs stable . No signs of acute distress. Discharge instructions given. Patient verbalized understanding. No other issues noted at this time.   

## 2017-01-06 NOTE — Progress Notes (Signed)
PRE HD assessment 

## 2017-01-06 NOTE — Progress Notes (Signed)
Pre HD assessment  

## 2017-01-06 NOTE — Progress Notes (Signed)
HD TX ended  

## 2017-01-06 NOTE — Progress Notes (Signed)
HD tx start 

## 2017-01-06 NOTE — Progress Notes (Signed)
Physical Therapy Treatment Patient Details Name: Brian [name] MRN: [id number] DOB: 12-16-1960 Today's Date: 01/06/2017    History of Present Illness Pt is a 56 y/o M who presented with weakness, lethargic, and decreased responsiveness.  CO2 elevated to 60.  Pt with acute on chronic renal fialure. Pt was admuitted with diagnosis of acute respiratory failure with hypercapnia.  Pt initially in the ICU and transfered to the floor on 11/10.  Pt now with permcath.   Pt placed on BiPAP.  Pt's PMH includes COPD, CHF, morbid obesity, neuropathy, spinal stenosis.      PT Comments    Mr. Herbster made progress with mobility but continues to be limited due to ambulatory endurance.  The pt ambulated 40 ft x2 requiring a seated rest break in between and pt fatigues quickly.  He requires cues for safe use of RW and is at an increased risk of falling.  SNF remains most appropriate d/c plan at this time; however, if the pt continues to refuse SNF the pt will need HHPT with 24/7 assist/supervision and a Bariatric RW at d/c.     Follow Up Recommendations  SNF;Supervision/Assistance - 24 hour(HHPT if pt unable to receive SNF at d/c)     Equipment Recommendations  Other (comment)(Bariatric RW)    Recommendations for Other Services       Precautions / Restrictions Precautions Precautions: Fall;Other (comment) Precaution Comments: O2, permcath Restrictions Weight Bearing Restrictions: No    Mobility  Bed Mobility               General bed mobility comments: Pt sitting EOB upon PT arrival  Transfers Overall transfer level: Needs assistance Equipment used: (Bariatric RW) Transfers: Sit to/from Stand Sit to Stand: Min guard         General transfer comment: Cues for proper hand placement and safe technique.   Pt is slow to rise and requires close min guard assist.   Ambulation/Gait Ambulation/Gait assistance: Min guard Ambulation Distance (Feet): 80 Feet(40 x2) Assistive device:  (Bariatric RW) Gait Pattern/deviations: Shuffle;Wide base of support;Trunk flexed;Antalgic Gait velocity: decreased Gait velocity interpretation: Below normal speed for age/gender General Gait Details: Pt with flexed trunk and pushing RW too far ahead which improved with verbal cues and demonstration.  SpO2 remains at or above 91% on 2L O2 throughout session.  Pt ambulates 40 ft and requires seated rest break before ambulating an additional 40 ft.  He fatigues quickly.    Stairs            Wheelchair Mobility    Modified Rankin (Stroke Patients Only)       Balance Overall balance assessment: Needs assistance;History of Falls Sitting-balance support: No upper extremity supported;Feet supported Sitting balance-Leahy Scale: Good Sitting balance - Comments: Pt able to sit without UE support   Standing balance support: Bilateral upper extremity supported;During functional activity Standing balance-Leahy Scale: Poor Standing balance comment: Pt relies on UE support for static and dynamic activities                            Cognition Arousal/Alertness: Awake/alert Behavior During Therapy: WFL for tasks assessed/performed Overall Cognitive Status: Within Functional Limits for tasks assessed                                        Exercises General Exercises - Lower Extremity Ankle Circles/Pumps:  AROM;Both;10 reps;Seated Other Exercises Other Exercises: Pt educated in energy conservation strategies including work simplification, AE/DME, home/routines modifications, pursed lip breathing, and activity pacing to maximize safety and functional independence with ADL tasks. Pt verbalized understanding. Other Exercises: Pt educated in cognitive behavioral pain coping strategies including progressive muscle relaxation, distraction, PLB, and pleasant imagery to better manage his chronic pain. Pt verbalized understanding.    General Comments         Pertinent Vitals/Pain Pain Assessment: 0-10 Pain Score: 10-Worst pain ever Pain Location: chronic back pain Pain Descriptors / Indicators: Discomfort;Aching;Constant Pain Intervention(s): Limited activity within patient's tolerance;Monitored during session;Repositioned    Home Living Family/patient expects to be discharged to:: Private residence Living Arrangements: Spouse/significant other Available Help at Discharge: Family;Available 24 hours/day Type of Home: Mobile home Home Access: Stairs to enter Entrance Stairs-Rails: Left;Right;Can reach both Home Layout: One level Home Equipment: Environmental consultant - 2 wheels;Shower seat;Grab bars - tub/shower;Hand held shower head;Cane - single point;Transport chair;Adaptive equipment Additional Comments: grab bars are suction; educated on better grab bars to maximize safety    Prior Function Level of Independence: Independent with assistive device(s)      Comments: Pt has been ambulating mostly with SPC and using RW when he feels more weak.  He has ~4-5 falls/wk.  Pt independent with bathing, dressing.     PT Goals (current goals can now be found in the care plan section) Acute Rehab PT Goals Patient Stated Goal: pt refusing rehab, wants to go home at d/c PT Goal Formulation: With patient Time For Goal Achievement: 01/18/17 Potential to Achieve Goals: Good Progress towards PT goals: Progressing toward goals    Frequency    Min 2X/week      PT Plan Discharge plan needs to be updated    Co-evaluation              AM-PAC PT "6 Clicks" Daily Activity  Outcome Measure  Difficulty turning over in bed (including adjusting bedclothes, sheets and blankets)?: Unable Difficulty moving from lying on back to sitting on the side of the bed? : Unable Difficulty sitting down on and standing up from a chair with arms (e.g., wheelchair, bedside commode, etc,.)?: A Lot Help needed moving to and from a bed to chair (including a wheelchair)?: A  Little Help needed walking in hospital room?: A Little Help needed climbing 3-5 steps with a railing? : Total 6 Click Score: 11    End of Session Equipment Utilized During Treatment: Gait belt;Oxygen Activity Tolerance: Patient limited by fatigue Patient left: with call bell/phone within reach;in chair;with chair alarm set Nurse Communication: Mobility status;Other (comment)(SpO2) PT Visit Diagnosis: Repeated falls (R29.6);History of falling (Z91.81);Muscle weakness (generalized) (M62.81);Unsteadiness on feet (R26.81);Other abnormalities of gait and mobility (R26.89)     Time: 8338-2505 PT Time Calculation (min) (ACUTE ONLY): 20 min  Charges:  $Gait Training: 8-22 mins                    G Codes:       Collie Siad PT, DPT 01/06/2017, 2:45 PM

## 2017-01-06 NOTE — Discharge Summary (Signed)
[facility] [place] NAME: Brian [name]    MR#:  [id number]  DATE OF BIRTH:  Sep 15, 1960  DATE OF ADMISSION:  12/29/2016 ADMITTING PHYSICIAN: Lance Coon, MD  DATE OF DISCHARGE: 01/06/2017  PRIMARY CARE PHYSICIAN: Lorelee Market, MD    ADMISSION DIAGNOSIS:  Acidosis [E87.2] Generalized weakness [R53.1] COPD with respiratory failure, acute ([place]) [J96.00, J44.9] Contusion of right hip, initial encounter [S70.01XA]  DISCHARGE DIAGNOSIS:  Principal Problem:   Acute respiratory failure with hypercapnia (HCC) Active Problems:   HLD (hyperlipidemia)   HTN (hypertension)   DM (diabetes mellitus) type II controlled with renal manifestation (HCC)   COPD (chronic obstructive pulmonary disease) (HCC)   Obstructive sleep apnea   Acute on chronic diastolic CHF (congestive heart failure) ([place])   SECONDARY DIAGNOSIS:   Past Medical History:  Diagnosis Date  . CHF (congestive heart failure) ([place])   . COPD (chronic obstructive pulmonary disease) ([place])   . Diabetes mellitus type 2 in obese ([place])   . DM (diabetes mellitus) type II controlled with renal manifestation ([place])   . Hypercholesteremia   . Hypertension   . Morbid obesity with BMI of 45.0-49.9, adult ([place])   . Neuropathy   . Pancreatitis, acute   . Pneumonia   . Spinal stenosis     HOSPITAL COURSE:  56 y/o male with COPD and CKD who presented with SOB   1. Acute respiratory failure with hypercapnia due to acute on chronic diastolic CHF exacerbation with pEF and underlying renal disease with fluid overload. He is now off of Bipap and weaned to oxygen [place]  2. COPD: No signs of exacerbation  3. Elevated troponin due to demand ischemia and has ruled out for ACS He has been seen by cardiology-Dr. Saralyn Pilar Echo shows nml EF and no WMA.major valvular abnormalities.  4.Acute on chronic renal failurestage IV with proteinuria nephrotic range Patient has progressed to  endstage renal disease  He has outpatient HD set up and will consider PD as an outpatient. Continue torsemide    5. Essential hypertension Continue Coreg  6. Hypothyroidism continue Synthroid  7. Depression continue Zoloft  8. HLD: Continue statin   9. Diabetes: Due to renal disease his blood sugars were in good range and therefore for now all diabetic medications have been discontinued and he will need outpatient follow up for this.      DISCHARGE CONDITIONS AND DIET:  Stable Renal diabetic diet  CONSULTS OBTAINED:  Treatment Team:  Lavonia Dana, MD Isaias Cowman, MD Evaristo Bury, MD Schnier, Dolores Lory, MD  DRUG ALLERGIES:   Allergies  Allergen Reactions  . Naproxen Rash    DISCHARGE MEDICATIONS:   Current Discharge Medication List    START taking these medications   Details  Nutritional Supplements (FEEDING SUPPLEMENT, NEPRO CARB STEADY,) LIQD Take 237 mLs 2 (two) times daily between meals by mouth. Qty: 14220 mL, Refills: 0      CONTINUE these medications which have CHANGED   Details  gabapentin (NEURONTIN) 300 MG capsule Take 1 capsule (300 mg total) 3 (three) times daily by mouth. Qty: 30 capsule, Refills: 0    torsemide (DEMADEX) 100 MG tablet Take 1 tablet (100 mg total) daily by mouth. Qty: 30 tablet, Refills: 0      CONTINUE these medications which have NOT CHANGED   Details  albuterol (PROVENTIL HFA;VENTOLIN HFA) 108 (90 Base) MCG/ACT inhaler Inhale 1-2 puffs every 4 (four) hours as needed into the lungs for wheezing or shortness  of breath.    atorvastatin (LIPITOR) 80 MG tablet Take 80 mg daily by mouth.    carvedilol (COREG) 25 MG tablet Take 25 mg by mouth 2 (two) times daily with a meal.   Associated Diagnoses: Chronic diastolic heart failure (HCC)    levothyroxine (SYNTHROID, LEVOTHROID) 50 MCG tablet Take 50 mcg daily before breakfast by mouth.    sertraline (ZOLOFT) 100 MG tablet Take 25 mg by mouth daily.      Vitamin D, Ergocalciferol, (DRISDOL) 50000 units CAPS capsule Take 50,000 Units by mouth every 7 (seven) days.    aspirin EC 81 MG EC tablet Take 1 tablet (81 mg total) by mouth daily. Qty: 30 tablet, Refills: 0    Tiotropium Bromide-Olodaterol (STIOLTO RESPIMAT) 2.5-2.5 MCG/ACT AERS Inhale into the lungs.      STOP taking these medications     fluticasone (FLOVENT HFA) 220 MCG/ACT inhaler      insulin aspart (NOVOLOG) 100 unit/mL injection      insulin glargine (LANTUS) 100 UNIT/ML injection      linagliptin (TRADJENTA) 5 MG TABS tablet      losartan (COZAAR) 50 MG tablet      cyclobenzaprine (FLEXERIL) 5 MG tablet      pioglitazone (ACTOS) 45 MG tablet           Today   CHIEF COMPLAINT:  Doing well Ready to go home   VITAL SIGNS:  Blood pressure 127/60, pulse 64, temperature 98.6 F (37 C), temperature source Oral, resp. rate 20, height 5\' 10"  (1.778 m), weight (!) 155.4 kg (342 lb 8 oz), SpO2 90 %.   REVIEW OF SYSTEMS:  Review of Systems  Constitutional: Negative.  Negative for chills, fever and malaise/fatigue.  HENT: Negative.  Negative for ear discharge, ear pain, hearing loss, nosebleeds and sore throat.   Eyes: Negative.  Negative for blurred vision and pain.  Respiratory: Negative.  Negative for cough, hemoptysis, shortness of breath and wheezing.   Cardiovascular: Positive for leg swelling. Negative for chest pain and palpitations.  Gastrointestinal: Negative.  Negative for abdominal pain, blood in stool, diarrhea, nausea and vomiting.  Genitourinary: Negative.  Negative for dysuria.  Musculoskeletal: Negative.  Negative for back pain.  Skin: Negative.   Neurological: Negative for dizziness, tremors, speech change, focal weakness, seizures and headaches.  Endo/Heme/Allergies: Negative.  Does not bruise/bleed easily.  Psychiatric/Behavioral: Negative.  Negative for depression, hallucinations and suicidal ideas.     PHYSICAL EXAMINATION:   GENERAL:  56 y.o.-year-old patient lying in the bed with no acute distress.  NECK:  Supple, no jugular venous distention. No thyroid enlargement, no tenderness.  LUNGS: Normal breath sounds bilaterally, no wheezing, rales,rhonchi  No use of accessory muscles of respiration.  CARDIOVASCULAR: S1, S2 normal. No murmurs, rubs, or gallops.  ABDOMEN: Soft, non-tender, non-distended. Bowel sounds present. No organomegaly or mass.  EXTREMITIES: 2+ LEE NO  cyanosis, or clubbing.  PSYCHIATRIC: The patient is alert and oriented x 3.  SKIN: No obvious rash, lesion, or ulcer.   DATA REVIEW:   CBC Recent Labs  Lab 01/05/17 0853  WBC 11.2*  HGB 8.7*  HCT 26.5*  PLT 223    Chemistries  Recent Labs  Lab 12/31/16 1603  01/06/17 0418  NA 136   < > 137  K 4.1   < > 3.0*  CL 106   < > 97*  CO2 18*   < > 29  GLUCOSE 158*   < > 121*  BUN 92*   < >  38*  CREATININE 5.41*   < > 2.84*  CALCIUM 7.3*   < > 8.1*  AST 15  --   --   ALT 29  --   --   ALKPHOS 126  --   --   BILITOT 0.4  --   --    < > = values in this interval not displayed.    Cardiac Enzymes Recent Labs  Lab 12/30/16 1307  TROPONINI 1.84*    Microbiology Results  @MICRORSLT48 @  RADIOLOGY:  No results found.    Current Discharge Medication List    START taking these medications   Details  Nutritional Supplements (FEEDING SUPPLEMENT, NEPRO CARB STEADY,) LIQD Take 237 mLs 2 (two) times daily between meals by mouth. Qty: 14220 mL, Refills: 0      CONTINUE these medications which have CHANGED   Details  gabapentin (NEURONTIN) 300 MG capsule Take 1 capsule (300 mg total) 3 (three) times daily by mouth. Qty: 30 capsule, Refills: 0    torsemide (DEMADEX) 100 MG tablet Take 1 tablet (100 mg total) daily by mouth. Qty: 30 tablet, Refills: 0      CONTINUE these medications which have NOT CHANGED   Details  albuterol (PROVENTIL HFA;VENTOLIN HFA) 108 (90 Base) MCG/ACT inhaler Inhale 1-2 puffs every 4 (four) hours as  needed into the lungs for wheezing or shortness of breath.    atorvastatin (LIPITOR) 80 MG tablet Take 80 mg daily by mouth.    carvedilol (COREG) 25 MG tablet Take 25 mg by mouth 2 (two) times daily with a meal.   Associated Diagnoses: Chronic diastolic heart failure (HCC)    levothyroxine (SYNTHROID, LEVOTHROID) 50 MCG tablet Take 50 mcg daily before breakfast by mouth.    sertraline (ZOLOFT) 100 MG tablet Take 25 mg by mouth daily.     Vitamin D, Ergocalciferol, (DRISDOL) 50000 units CAPS capsule Take 50,000 Units by mouth every 7 (seven) days.    aspirin EC 81 MG EC tablet Take 1 tablet (81 mg total) by mouth daily. Qty: 30 tablet, Refills: 0    Tiotropium Bromide-Olodaterol (STIOLTO RESPIMAT) 2.5-2.5 MCG/ACT AERS Inhale into the lungs.      STOP taking these medications     fluticasone (FLOVENT HFA) 220 MCG/ACT inhaler      insulin aspart (NOVOLOG) 100 unit/mL injection      insulin glargine (LANTUS) 100 UNIT/ML injection      linagliptin (TRADJENTA) 5 MG TABS tablet      losartan (COZAAR) 50 MG tablet      cyclobenzaprine (FLEXERIL) 5 MG tablet      pioglitazone (ACTOS) 45 MG tablet            Management plans discussed with the patient and he is in agreement. Stable for discharge home Patient should follow up with PCP  CODE STATUS:     Code Status Orders  (From admission, onward)        Start     Ordered   12/30/16 0043  Full code  Continuous     12/30/16 0042    Code Status History    Date Active Date Inactive Code Status Order ID Comments User Context   02/27/2016 00:23 02/28/2016 16:06 Full Code [id number]  Harvie Bridge, DO Inpatient   02/08/2016 00:28 02/09/2016 18:20 Full Code [id number]  Laverle Hobby, MD ED   01/06/2015 00:09 01/08/2015 21:38 Full Code [id number]  Hubbard Robinson, MD ED      TOTAL TIME TAKING CARE OF THIS PATIENT: 17  minutes.    Note: This dictation was prepared with Dragon dictation along with smaller phrase  technology. Any transcriptional errors that result from this process are unintentional.  Jatasia Gundrum M.D on 01/06/2017 at 9:22 AM  Between 7am to 6pm - Pager - [phone] After 6pm go to [email or web address] - password [email or web address] [facility]  Office  [phone]  CC: Primary care physician; Lorelee Market, MD

## 2017-01-06 NOTE — Progress Notes (Signed)
Post HD  

## 2017-01-06 NOTE — Progress Notes (Signed)
Nutrition Education Note  RD consulted for Renal Education. Reviewed food groups and provided written recommended serving sizes specifically determined for patient's current nutritional status.   Explained why diet restrictions are needed and provided lists of foods to limit/avoid that are high potassium, sodium, and phosphorus. Provided specific recommendations on safer alternatives of these foods. Strongly encouraged compliance of this diet.   Discussed importance of protein intake at each meal and snack. Provided examples of how to maximize protein intake throughout the day. Discussed need for fluid restriction with dialysis, importance of minimizing weight gain between HD treatments, and renal-friendly beverage options.  Discussed the importance of daily renal MVI and vitamin C  Encouraged pt to discuss specific diet questions/concerns with RD at HD outpatient facility. Teach back method used.  Expect fair compliance.  Body mass index is 49.14 kg/m. Pt meets criteria for morbid obesity based on current BMI.  Current diet order is renal.CHO, patient is consuming approximately 100% of meals at this time.   RD following this pt   Koleen Distance MS, RD, LDN Pager #[phone] After Hours Pager: [phone]

## 2017-01-06 NOTE — Progress Notes (Signed)
[place], [place] 01/06/17  Subjective:   Patient is doing well.  Eating better.  Able to breathe better but still requiring oxygen supplementation by nasal cannula.  Continues to have lower extremity edema.  Currently on high-dose torsemide 100 mg daily.  Potassium was low therefore spironolactone was added.   Objective:  Vital signs in last 24 hours:  Temp:  [98.3 F (36.8 C)-98.7 F (37.1 C)] 98.6 F (37 C) (11/15 0826) Pulse Rate:  [60-68] 64 (11/15 0826) Resp:  [15-20] 20 (11/15 0826) BP: (101-140)/(41-67) 127/60 (11/15 0826) SpO2:  [90 %-100 %] 90 % (11/15 0826) Weight:  [155.4 kg (342 lb 8 oz)] 155.4 kg (342 lb 8 oz) (11/15 0416)  Weight change: -4.643 kg (-3.8 oz) Filed Weights   01/05/17 0454 01/06/17 0416  Weight: (!) 157.9 kg (348 lb 3.2 oz) (!) 155.4 kg (342 lb 8 oz)    Intake/Output:    Intake/Output Summary (Last 24 hours) at 01/06/2017 1231 Last data filed at 01/06/2017 0900 Gross per 24 hour  Intake 60 ml  Output 1412 ml  Net -1352 ml     Physical Exam: General: NAD  HEENT  anicteric  Neck  supple  Pulm/lungs  normal breathing effort,  decreased breath sounds in bases  CVS/Heart  regular rhythm, no rub  Abdomen:   Soft, obese  Extremities:  2+ pitting edema  Neurologic:  Alert and oriented  Skin:  No acute rashes  Access:  Right IJ PermCath       Basic Metabolic Panel:  Recent Labs  Lab 12/31/16 1603  01/02/17 0326 01/03/17 0258 01/04/17 0711 01/05/17 0853 01/06/17 0418  NA 136   < > 135 136 137 135 137  K 4.1   < > 4.3 4.2 5.1 3.4* 3.0*  CL 106   < > 105 105 104 96* 97*  CO2 18*   < > 19* 18* 18* 26 29  GLUCOSE 158*   < > 157* 107* 94 148* 121*  BUN 92*   < > 107* 121* 92* 64* 38*  CREATININE 5.41*   < > 5.47* 5.96* 4.79* 3.68* 2.84*  CALCIUM 7.3*   < > 7.4* 7.9* 8.0* 7.7* 8.1*  PHOS 9.0*  --   --  10.5*  --   --   --    < > = values in this interval not displayed.     CBC: Recent Labs  Lab  12/31/16 1603 01/02/17 1500 01/05/17 0853  WBC 9.8 9.9 11.2*  HGB 9.0* 8.8* 8.7*  HCT 27.4* 26.5* 26.5*  MCV 91.4 90.8 89.4  PLT 199 171 223      Lab Results  Component Value Date   HEPBSAG Negative 12/31/2016   HEPBSAB Non Reactive 12/31/2016      Microbiology:  Recent Results (from the past 240 hour(s))  MRSA PCR Screening     Status: None   Collection Time: 12/30/16 12:36 AM  Result Value Ref Range Status   MRSA by PCR NEGATIVE NEGATIVE Final    Comment:        The GeneXpert MRSA Assay (FDA approved for NASAL specimens only), is one component of a comprehensive MRSA colonization surveillance program. It is not intended to diagnose MRSA infection nor to guide or monitor treatment for MRSA infections.     Coagulation Studies: No results for input(s): LABPROT, INR in the last 72 hours.  Urinalysis: No results for input(s): COLORURINE, LABSPEC, PHURINE, GLUCOSEU, HGBUR, BILIRUBINUR, KETONESUR, PROTEINUR, UROBILINOGEN, NITRITE, LEUKOCYTESUR in  the last 72 hours.  Invalid input(s): APPERANCEUR    Imaging: No results found.   Medications:    . arformoterol  15 mcg Nebulization BID  . aspirin EC  81 mg Oral Daily  . atorvastatin  80 mg Oral Daily  . carvedilol  25 mg Oral BID WC  . chlorhexidine  15 mL Mouth Rinse BID  . feeding supplement (NEPRO CARB STEADY)  237 mL Oral BID BM  . gabapentin  300 mg Oral TID  . heparin subcutaneous  5,000 Units Subcutaneous Q8H  . insulin aspart  0-15 Units Subcutaneous Q4H  . levothyroxine  50 mcg Oral QAC breakfast  . mouth rinse  15 mL Mouth Rinse q12n4p  . multivitamin  1 tablet Oral QHS  . sertraline  25 mg Oral Daily  . spironolactone  25 mg Oral Daily  . torsemide  100 mg Oral Daily  . umeclidinium bromide  1 puff Inhalation Daily   acetaminophen **OR** acetaminophen, ipratropium-albuterol, ondansetron **OR** ondansetron (ZOFRAN) IV, oxyCODONE-acetaminophen  Assessment/ Plan:  56 y.o.caucasian  male with  diabetes mellitus type II insulin dependent, diabetic peripheral neuropathy, right toe amputation, hypertension, hyperlipidemia, hypogonadism, COPD/tobacco use, depression   1.  End-stage renal disease.  First dialysis November 9 2.  Proteinuria 3.  Lower extremity edema 4.  Diabetes  type 2 with CKD.  Hemoglobin A1c 6.8% on November 11. 5.  Hypokalemia.  Plan: Clinically, patient much improved after starting dialysis Volume removal with dialysis as tolerated continue high-dose torsemide for volume control, add spironolactone. Discharge planning for outpatient dialysis unit. [place] Octave Mikeal Hawthorne PPD orders placed. Hepatitis studies available from November 9     LOS: [place] 11/15/201812:31 PM  [place] Kunia Shannon, [place]

## 2017-01-06 NOTE — Evaluation (Signed)
Occupational Therapy Evaluation Patient Details Name: Brian [name] MRN: [id number] DOB: September 16, 1960 Today's Date: 01/06/2017    History of Present Illness Pt is a 56 y/o M who presented with weakness, lethargic, and decreased responsiveness.  CO2 elevated to 60.  Pt with acute on chronic renal fialure. Pt was admuitted with diagnosis of acute respiratory failure with hypercapnia.  Pt initially in the ICU and transfered to the floor on 11/10.  Pt now with permcath.   Pt placed on BiPAP.  Pt's PMH includes COPD, CHF, morbid obesity, neuropathy, spinal stenosis.     Clinical Impression   Pt seen for OT evaluation this date. Pt presents with functional deficits and impairments noted below (see pt problem list). Pt generally requires min-mod assist for LB ADL tasks and was provided with education in AE/DME for bathing, dressing, and toileting tasks to maximize safety while implementing energy conservation strategies also taught to him this date, including work simplification, AE/DME, home/routines modifications, pursed lip breathing, and activity pacing to maximize safety and functional independence with ADL tasks. To better support chronic pain mgt, pt educated in cognitive behavioral pain coping strategies including progressive muscle relaxation, distraction, PLB, and pleasant imagery to better manage his chronic pain. Pt verbalized understanding of all education/training provided. Would benefit from additional skilled OT services to maximize return to PLOF, minimize risk of future falls, injury, rehospitalization, and increased caregiver burden and/or functional decline.     Follow Up Recommendations  SNF    Equipment Recommendations  Other (comment)(LH shoe horn, LH sponge, reacher)    Recommendations for Other Services       Precautions / Restrictions Precautions Precautions: Fall;Other (comment) Precaution Comments: O2, permcath Restrictions Weight Bearing Restrictions: No       Mobility Bed Mobility               General bed mobility comments: deferred, pt up on side of bed upon entry (RN in room finishing assessment)  Transfers                      Balance Overall balance assessment: Needs assistance;History of Falls Sitting-balance support: No upper extremity supported;Feet supported Sitting balance-Leahy Scale: Fair Sitting balance - Comments: Pt able to sit EOB without LOB but would likely lose his balance with perturbation                                   ADL either performed or assessed with clinical judgement   ADL Overall ADL's : Needs assistance/impaired                                       General ADL Comments: generally min-mod assist for LB ADL tasks, educated in AE/DME for bathing, dressing, and toileting tasks to maximize safety while implementing energy conservation strategies.      Vision Baseline Vision/History: Wears glasses Wears Glasses: Reading only Patient Visual Report: No change from baseline       Perception     Praxis      Pertinent Vitals/Pain Pain Assessment: 0-10 Pain Score: 8  Pain Location: chronic pain - back, hip, L leg Pain Descriptors / Indicators: Aching;Discomfort;Grimacing Pain Intervention(s): Limited activity within patient's tolerance;Monitored during session     Hand Dominance     Extremity/Trunk Assessment Upper Extremity Assessment Upper Extremity Assessment: Generalized  weakness(grossly 4/5 bilaterally)   Lower Extremity Assessment Lower Extremity Assessment: Defer to PT evaluation;Generalized weakness   Cervical / Trunk Assessment Cervical / Trunk Assessment: Normal   Communication Communication Communication: No difficulties   Cognition Arousal/Alertness: Awake/alert Behavior During Therapy: WFL for tasks assessed/performed Overall Cognitive Status: Within Functional Limits for tasks assessed                                      General Comments       Exercises Other Exercises Other Exercises: Pt educated in energy conservation strategies including work simplification, AE/DME, home/routines modifications, pursed lip breathing, and activity pacing to maximize safety and functional independence with ADL tasks. Pt verbalized understanding. Other Exercises: Pt educated in cognitive behavioral pain coping strategies including progressive muscle relaxation, distraction, PLB, and pleasant imagery to better manage his chronic pain. Pt verbalized understanding.   Shoulder Instructions      Home Living Family/patient expects to be discharged to:: Private residence Living Arrangements: Spouse/significant other Available Help at Discharge: Family;Available 24 hours/day Type of Home: Mobile home Home Access: Stairs to enter Entrance Stairs-Number of Steps: 6 Entrance Stairs-Rails: Left;Right;Can reach both Home Layout: One level     Bathroom Shower/Tub: Occupational psychologist: Handicapped height Bathroom Accessibility: Yes   Home Equipment: Environmental consultant - 2 wheels;Shower seat;Grab bars - tub/shower;Hand held shower head;Cane - single point;Transport chair;Adaptive equipment   Additional Comments: grab bars are suction; educated on better grab bars to maximize safety      Prior Functioning/Environment Level of Independence: Independent with assistive device(s)        Comments: Pt has been ambulating mostly with SPC and using RW when he feels more weak.  He has ~4-5 falls/wk.  Pt independent with bathing, dressing.          OT Problem List: Cardiopulmonary status limiting activity;Decreased activity tolerance;Decreased strength;Decreased knowledge of use of DME or AE;Obesity;Impaired balance (sitting and/or standing)      OT Treatment/Interventions: Self-care/ADL training;Therapeutic exercise;Therapeutic activities;Energy conservation;DME and/or AE instruction;Patient/family education    OT  Goals(Current goals can be found in the care plan section) Acute Rehab OT Goals Patient Stated Goal: rehab before home OT Goal Formulation: With patient Time For Goal Achievement: 01/20/17 Potential to Achieve Goals: Good  OT Frequency: Min 1X/week   Barriers to D/C:            Co-evaluation              AM-PAC PT "6 Clicks" Daily Activity     Outcome Measure Help from another person eating meals?: None Help from another person taking care of personal grooming?: None Help from another person toileting, which includes using toliet, bedpan, or urinal?: A Little Help from another person bathing (including washing, rinsing, drying)?: A Lot Help from another person to put on and taking off regular upper body clothing?: A Little Help from another person to put on and taking off regular lower body clothing?: A Lot 6 Click Score: 18   End of Session Nurse Communication: Other (comment)(made aware, on room air, seated EOB per pt request)  Activity Tolerance: Patient tolerated treatment well Patient left: in bed;with bed alarm set;with call bell/phone within reach  OT Visit Diagnosis: Other abnormalities of gait and mobility (R26.89);Repeated falls (R29.6);Muscle weakness (generalized) (M62.81)                Time: 6967-8938 OT Time  Calculation (min): 35 min Charges:  OT General Charges $OT Visit: 1 Visit OT Evaluation $OT Eval Low Complexity: 1 Low OT Treatments $Self Care/Home Management : 23-37 mins G-Codes: OT G-codes **NOT FOR INPATIENT CLASS** Functional Assessment Tool Used: AM-PAC 6 Clicks Daily Activity;Clinical judgement Functional Limitation: Self care Self Care Current Status (M2194): At least 40 percent but less than 60 percent impaired, limited or restricted Self Care Goal Status (F1252): At least 20 percent but less than 40 percent impaired, limited or restricted   Jeni Salles, MPH, MS, OTR/L ascom [phone] 01/06/17, 11:32 AM

## 2017-01-10 ENCOUNTER — Other Ambulatory Visit: Payer: Self-pay

## 2017-01-10 ENCOUNTER — Ambulatory Visit: Payer: Medicaid Other | Attending: Family | Admitting: Family

## 2017-01-10 ENCOUNTER — Encounter: Payer: Self-pay | Admitting: Family

## 2017-01-10 VITALS — BP 130/56 | HR 74 | Resp 18 | Ht 70.0 in | Wt 344.2 lb

## 2017-01-10 DIAGNOSIS — Z6841 Body Mass Index (BMI) 40.0 and over, adult: Secondary | ICD-10-CM | POA: Diagnosis not present

## 2017-01-10 DIAGNOSIS — N189 Chronic kidney disease, unspecified: Secondary | ICD-10-CM | POA: Diagnosis not present

## 2017-01-10 DIAGNOSIS — I5032 Chronic diastolic (congestive) heart failure: Secondary | ICD-10-CM

## 2017-01-10 DIAGNOSIS — I509 Heart failure, unspecified: Secondary | ICD-10-CM | POA: Diagnosis not present

## 2017-01-10 DIAGNOSIS — I130 Hypertensive heart and chronic kidney disease with heart failure and stage 1 through stage 4 chronic kidney disease, or unspecified chronic kidney disease: Secondary | ICD-10-CM | POA: Diagnosis present

## 2017-01-10 DIAGNOSIS — Z7982 Long term (current) use of aspirin: Secondary | ICD-10-CM | POA: Diagnosis not present

## 2017-01-10 DIAGNOSIS — Z9889 Other specified postprocedural states: Secondary | ICD-10-CM | POA: Insufficient documentation

## 2017-01-10 DIAGNOSIS — F17210 Nicotine dependence, cigarettes, uncomplicated: Secondary | ICD-10-CM | POA: Diagnosis not present

## 2017-01-10 DIAGNOSIS — J449 Chronic obstructive pulmonary disease, unspecified: Secondary | ICD-10-CM | POA: Diagnosis not present

## 2017-01-10 DIAGNOSIS — Z720 Tobacco use: Secondary | ICD-10-CM

## 2017-01-10 DIAGNOSIS — G4733 Obstructive sleep apnea (adult) (pediatric): Secondary | ICD-10-CM | POA: Insufficient documentation

## 2017-01-10 DIAGNOSIS — Z79899 Other long term (current) drug therapy: Secondary | ICD-10-CM | POA: Insufficient documentation

## 2017-01-10 DIAGNOSIS — Z794 Long term (current) use of insulin: Secondary | ICD-10-CM

## 2017-01-10 DIAGNOSIS — I890 Lymphedema, not elsewhere classified: Secondary | ICD-10-CM | POA: Diagnosis not present

## 2017-01-10 DIAGNOSIS — E1122 Type 2 diabetes mellitus with diabetic chronic kidney disease: Secondary | ICD-10-CM | POA: Diagnosis not present

## 2017-01-10 DIAGNOSIS — N186 End stage renal disease: Secondary | ICD-10-CM

## 2017-01-10 DIAGNOSIS — Z9049 Acquired absence of other specified parts of digestive tract: Secondary | ICD-10-CM | POA: Insufficient documentation

## 2017-01-10 DIAGNOSIS — Z992 Dependence on renal dialysis: Secondary | ICD-10-CM

## 2017-01-10 LAB — GLUCOSE, CAPILLARY: GLUCOSE-CAPILLARY: 213 mg/dL — AB (ref 65–99)

## 2017-01-10 NOTE — Progress Notes (Signed)
Patient ID: Brian [name], male    DOB: February 23, 1961, 56 y.o.   MRN: [id number]  HPI  Brian [name] is a 56 y/o male with a history of pancreatitis, pneumonia, HTN, hyperlipidemia, DM, COPD, CKD, current tobacco use and chronic heart failure.   Echo done 12/30/16 reviewed and showed an EF of 55-65% along with trivial Brian/TR. Previous echo was done 02/09/16 with an EF of 55-60% without valvular regurgitation.   Admitted 12/29/16 due to HF exacerbation. Initially needed bipap and then weaned to [place] oxygen. Elevated troponin thought to be due to demand ischemia. Cardiology consult was obtained. Nephrology consult obtained with discussion of dialysis to be set up as an outpatient. Discharged home after 8 days. Was in the ED 07/12/16 after a fall. Evaluated and discharged home.   He presents today for his follow-up visit with a chief complaint of minimal shortness of breath upon moderate exertion. He describes this as chronic in nature having been present for several years with varying levels of severity. He has associated fatigue, cough, edema, leg weakness and chronic back pain along with this. He denies any chest pain, palpitations, dizziness, difficulty sleeping or weight gain. Has recently started dialysis and will begin outpatient dialysis tomorrow through a port-a-cath.   Past Medical History:  Diagnosis Date  . CHF (congestive heart failure) ([place])   . COPD (chronic obstructive pulmonary disease) ([place])   . Diabetes mellitus type 2 in obese ([place])   . DM (diabetes mellitus) type II controlled with renal manifestation ([place])   . Hypercholesteremia   . Hypertension   . Morbid obesity with BMI of 45.0-49.9, adult ([place])   . Neuropathy   . Pancreatitis, acute   . Pneumonia   . Spinal stenosis    Past Surgical History:  Procedure Laterality Date  . AMPUTATION    . APPENDECTOMY LAPAROSCOPIC drainage of peritoneal abcess N/A 01/06/2015   Performed by Sherri Rad, MD at [facility] ORS  . CHOLECYSTECTOMY  1998   . DIALYSIS/PERMA CATHETER INSERTION N/A 01/03/2017   Performed by Algernon Huxley, MD at [facility] INVASIVE CV LAB   Family History  Problem Relation Age of Onset  . Hypertension Mother   . Hyperlipidemia Mother   . Heart disease Father   . Heart disease Maternal Grandfather    Social History   Tobacco Use  . Smoking status: Current Every Day Smoker    Packs/day: 1.00    Types: Cigarettes  . Smokeless tobacco: Never Used  Substance Use Topics  . Alcohol use: No   Allergies  Allergen Reactions  . Naproxen Rash   Prior to Admission medications   Medication Sig Start Date End Date Taking? Authorizing Provider  albuterol (PROVENTIL HFA;VENTOLIN HFA) 108 (90 Base) MCG/ACT inhaler Inhale 1-2 puffs every 4 (four) hours as needed into the lungs for wheezing or shortness of breath.   Yes [provider]  aspirin EC 81 MG EC tablet Take 1 tablet (81 mg total) by mouth daily. 02/29/16  Yes [name], MD  atorvastatin (LIPITOR) 80 MG tablet Take 80 mg daily by mouth.   Yes [provider]  carvedilol (COREG) 25 MG tablet Take 25 mg by mouth 2 (two) times daily with a meal.   Yes [provider]  gabapentin (NEURONTIN) 300 MG capsule Take 1 capsule (300 mg total) 3 (three) times daily by mouth. 01/06/17  Yes Mody, Sital, MD  levothyroxine (SYNTHROID, LEVOTHROID) 50 MCG tablet Take 50 mcg daily before breakfast by mouth.  Yes [provider]  sertraline (ZOLOFT) 100 MG tablet Take 25 mg by mouth daily.  04/10/13  Yes [provider]  Tiotropium Bromide-Olodaterol (STIOLTO RESPIMAT) 2.5-2.5 MCG/ACT AERS Inhale into the lungs.   Yes [provider]  torsemide (DEMADEX) 100 MG tablet Take 1 tablet (100 mg total) daily by mouth. 01/06/17  Yes Mody, Sital, MD  Vitamin D, Ergocalciferol, (DRISDOL) 50000 units CAPS capsule Take 50,000 Units by mouth every 7 (seven) days.   Yes [provider]  Nutritional Supplements (FEEDING SUPPLEMENT, NEPRO  CARB STEADY,) LIQD Take 237 mLs 2 (two) times daily between meals by mouth. Patient not taking: Reported on 01/10/2017 01/06/17 02/05/17  Bettey Costa, MD    Review of Systems  Constitutional: Positive for fatigue. Negative for appetite change.  HENT: Negative for congestion, postnasal drip and sore throat.   Eyes: Negative.   Respiratory: Positive for cough and shortness of breath (minimal). Negative for chest tightness.   Cardiovascular: Positive for leg swelling. Negative for chest pain and palpitations.  Gastrointestinal: Negative for abdominal distention and abdominal pain.  Endocrine: Negative.   Genitourinary: Negative.   Musculoskeletal: Positive for arthralgias (left leg pain) and back pain (chronic).  Skin: Negative.   Allergic/Immunologic: Negative.   Neurological: Positive for weakness (in legs). Negative for dizziness, light-headedness and headaches.  Hematological: Negative for adenopathy. Bruises/bleeds easily (right side of neck).  Psychiatric/Behavioral: Negative for dysphoric mood, sleep disturbance and suicidal ideas. The patient is not nervous/anxious.    Vitals:   01/10/17 0942  BP: (!) 130/56  Pulse: 74  Resp: 18  SpO2: 100%  Weight: (!) 344 lb 4 oz (156.2 kg)  Height: 5\' 10"  (1.778 m)   Wt Readings from Last 3 Encounters:  01/10/17 (!) 344 lb 4 oz (156.2 kg)  01/06/17 (!) 342 lb 8 oz (155.4 kg)  11/29/16 (!) 343 lb 4 oz (155.7 kg)    Lab Results  Component Value Date   CREATININE 2.84 (H) 01/06/2017   CREATININE 3.68 (H) 01/05/2017   CREATININE 4.79 (H) 01/04/2017    Physical Exam  Constitutional: He is oriented to person, place, and time. He appears well-developed and well-nourished.  HENT:  Head: Normocephalic and atraumatic.  Neck: Normal range of motion. Neck supple. No JVD present.  Cardiovascular: Normal rate and regular rhythm.  Pulmonary/Chest: Effort normal. He has no wheezes. He has no rales.  Abdominal: Soft. He exhibits no distension.  There is no tenderness.  Musculoskeletal: He exhibits edema (2+ pitting edema in bilateral lower legs). He exhibits no tenderness.  Neurological: He is alert and oriented to person, place, and time.  Skin: Skin is warm and dry.  Psychiatric: He has a normal mood and affect. His behavior is normal. Thought content normal.  Nursing note and vitals reviewed.    Assessment & Plan:  1: Chronic heart failure with preserved ejection fraction- - NYHA class II - moderate fluid overload with pedal edema present  - already weighing daily. Weight is stable from his last visit here. Reminded to call for an overnight weight gain of >2 pounds or a weekly weight gain of >5 pounds.  - currently taking torsemide 100mg  daily  - not adding salt to his food. Reading food labels. Did eat a bacon, egg, cheese biscuit this morning. Discussed sodium content of that food.  - saw cardiologist ([facility]) 10/29/16  - BMP from 01/06/17 reviewed and shows sodium 137, potassium 3.0 and GFR 23 - does not meet ReDs criteria due to BMI -  reports receiving his flu vaccine for the season already - would like a liftchair and he is going to call his insurance to see if they will pay. Says that some chairs at home sit low making it difficult for him to get out of them due to leg weakness - PharmD went in and reviewed medications with the patient  2: DM- - nonfasting glucose in the clinic was 213 (ate bacon/egg/cheese biscuit along with a regular coke) - has been taken off insulin and he was encouraged to keep a close eye on his glucose levels - beginning outpatient dialysis 3 days/week beginning tomorrow - follows with Dr. Juleen China  3: Lymphedema-  - stage 2 lymphedema present - does elevate the legs but without relief of edema - has worn compression socks but, again, without much relief of edema - has heard from [facility] compression [facility] but he has to get back in touch with them regarding delivery  4: Tobacco  use- - smoking < 1/2 ppd of cigarettes now and is working on quitting completely - discussed complete cessation for 3 minutes with him  5: Obstructive sleep apnea- - no longer wearing CPAP and he has returned the equipment - now wears oxygen at 3L at bedtime & reports sleeping well  Patient did not bring his medications nor a list. Each medication was verbally reviewed with the patient and he was encouraged to bring the bottles to every visit to confirm accuracy of list.  Return in 2 months or sooner for any questions/problems before then.

## 2017-01-10 NOTE — Patient Instructions (Signed)
Continue weighing daily and call for an overnight weight gain of > 2 pounds or a weekly weight gain of >5 pounds. 

## 2017-01-23 ENCOUNTER — Emergency Department: Payer: Medicaid Other

## 2017-01-23 ENCOUNTER — Other Ambulatory Visit: Payer: Self-pay

## 2017-01-23 ENCOUNTER — Inpatient Hospital Stay: Payer: Medicaid Other

## 2017-01-23 ENCOUNTER — Inpatient Hospital Stay
Admission: EM | Admit: 2017-01-23 | Discharge: 2017-01-31 | DRG: 280 | Disposition: A | Discharge status: Home Health | Payer: Medicaid Other | Attending: Internal Medicine | Admitting: Internal Medicine

## 2017-01-23 DIAGNOSIS — E1122 Type 2 diabetes mellitus with diabetic chronic kidney disease: Secondary | ICD-10-CM | POA: Diagnosis present

## 2017-01-23 DIAGNOSIS — I959 Hypotension, unspecified: Secondary | ICD-10-CM | POA: Diagnosis not present

## 2017-01-23 DIAGNOSIS — R509 Fever, unspecified: Secondary | ICD-10-CM

## 2017-01-23 DIAGNOSIS — I214 Non-ST elevation (NSTEMI) myocardial infarction: Secondary | ICD-10-CM

## 2017-01-23 DIAGNOSIS — E785 Hyperlipidemia, unspecified: Secondary | ICD-10-CM | POA: Diagnosis present

## 2017-01-23 DIAGNOSIS — N2581 Secondary hyperparathyroidism of renal origin: Secondary | ICD-10-CM | POA: Diagnosis present

## 2017-01-23 DIAGNOSIS — A4101 Sepsis due to Methicillin susceptible Staphylococcus aureus: Secondary | ICD-10-CM | POA: Diagnosis present

## 2017-01-23 DIAGNOSIS — R6521 Severe sepsis with septic shock: Secondary | ICD-10-CM | POA: Diagnosis present

## 2017-01-23 DIAGNOSIS — E871 Hypo-osmolality and hyponatremia: Secondary | ICD-10-CM | POA: Diagnosis not present

## 2017-01-23 DIAGNOSIS — N186 End stage renal disease: Secondary | ICD-10-CM | POA: Diagnosis present

## 2017-01-23 DIAGNOSIS — D631 Anemia in chronic kidney disease: Secondary | ICD-10-CM | POA: Diagnosis present

## 2017-01-23 DIAGNOSIS — I132 Hypertensive heart and chronic kidney disease with heart failure and with stage 5 chronic kidney disease, or end stage renal disease: Secondary | ICD-10-CM | POA: Diagnosis present

## 2017-01-23 DIAGNOSIS — Z9989 Dependence on other enabling machines and devices: Secondary | ICD-10-CM

## 2017-01-23 DIAGNOSIS — Z452 Encounter for adjustment and management of vascular access device: Secondary | ICD-10-CM

## 2017-01-23 DIAGNOSIS — J449 Chronic obstructive pulmonary disease, unspecified: Secondary | ICD-10-CM | POA: Diagnosis present

## 2017-01-23 DIAGNOSIS — I2510 Atherosclerotic heart disease of native coronary artery without angina pectoris: Secondary | ICD-10-CM | POA: Diagnosis present

## 2017-01-23 DIAGNOSIS — Z992 Dependence on renal dialysis: Secondary | ICD-10-CM

## 2017-01-23 DIAGNOSIS — E8779 Other fluid overload: Secondary | ICD-10-CM | POA: Diagnosis not present

## 2017-01-23 DIAGNOSIS — Z9119 Patient's noncompliance with other medical treatment and regimen: Secondary | ICD-10-CM | POA: Diagnosis not present

## 2017-01-23 DIAGNOSIS — Z6841 Body Mass Index (BMI) 40.0 and over, adult: Secondary | ICD-10-CM | POA: Diagnosis not present

## 2017-01-23 DIAGNOSIS — I5032 Chronic diastolic (congestive) heart failure: Secondary | ICD-10-CM | POA: Diagnosis present

## 2017-01-23 DIAGNOSIS — F329 Major depressive disorder, single episode, unspecified: Secondary | ICD-10-CM | POA: Diagnosis present

## 2017-01-23 DIAGNOSIS — A419 Sepsis, unspecified organism: Secondary | ICD-10-CM

## 2017-01-23 DIAGNOSIS — I249 Acute ischemic heart disease, unspecified: Secondary | ICD-10-CM | POA: Diagnosis present

## 2017-01-23 DIAGNOSIS — N179 Acute kidney failure, unspecified: Secondary | ICD-10-CM

## 2017-01-23 DIAGNOSIS — J9692 Respiratory failure, unspecified with hypercapnia: Secondary | ICD-10-CM | POA: Diagnosis present

## 2017-01-23 DIAGNOSIS — E876 Hypokalemia: Secondary | ICD-10-CM | POA: Diagnosis not present

## 2017-01-23 DIAGNOSIS — I10 Essential (primary) hypertension: Secondary | ICD-10-CM | POA: Diagnosis not present

## 2017-01-23 DIAGNOSIS — T82594A Other mechanical complication of infusion catheter, initial encounter: Secondary | ICD-10-CM

## 2017-01-23 DIAGNOSIS — F17210 Nicotine dependence, cigarettes, uncomplicated: Secondary | ICD-10-CM | POA: Diagnosis present

## 2017-01-23 DIAGNOSIS — Z89421 Acquired absence of other right toe(s): Secondary | ICD-10-CM

## 2017-01-23 DIAGNOSIS — N170 Acute kidney failure with tubular necrosis: Secondary | ICD-10-CM | POA: Diagnosis not present

## 2017-01-23 DIAGNOSIS — E1151 Type 2 diabetes mellitus with diabetic peripheral angiopathy without gangrene: Secondary | ICD-10-CM | POA: Diagnosis present

## 2017-01-23 DIAGNOSIS — T827XXA Infection and inflammatory reaction due to other cardiac and vascular devices, implants and grafts, initial encounter: Secondary | ICD-10-CM | POA: Diagnosis present

## 2017-01-23 DIAGNOSIS — R7881 Bacteremia: Secondary | ICD-10-CM | POA: Diagnosis not present

## 2017-01-23 DIAGNOSIS — E662 Morbid (severe) obesity with alveolar hypoventilation: Secondary | ICD-10-CM | POA: Diagnosis present

## 2017-01-23 DIAGNOSIS — I5033 Acute on chronic diastolic (congestive) heart failure: Secondary | ICD-10-CM | POA: Diagnosis not present

## 2017-01-23 DIAGNOSIS — T8579XA Infection and inflammatory reaction due to other internal prosthetic devices, implants and grafts, initial encounter: Secondary | ICD-10-CM | POA: Diagnosis not present

## 2017-01-23 DIAGNOSIS — Z7982 Long term (current) use of aspirin: Secondary | ICD-10-CM

## 2017-01-23 DIAGNOSIS — R197 Diarrhea, unspecified: Secondary | ICD-10-CM | POA: Diagnosis present

## 2017-01-23 DIAGNOSIS — M4800 Spinal stenosis, site unspecified: Secondary | ICD-10-CM | POA: Diagnosis present

## 2017-01-23 DIAGNOSIS — Y848 Other medical procedures as the cause of abnormal reaction of the patient, or of later complication, without mention of misadventure at the time of the procedure: Secondary | ICD-10-CM | POA: Diagnosis present

## 2017-01-23 DIAGNOSIS — J9601 Acute respiratory failure with hypoxia: Secondary | ICD-10-CM | POA: Diagnosis not present

## 2017-01-23 DIAGNOSIS — Z888 Allergy status to other drugs, medicaments and biological substances status: Secondary | ICD-10-CM

## 2017-01-23 LAB — CBC WITH DIFFERENTIAL/PLATELET
BASOS ABS: 0.1 10*3/uL (ref 0–0.1)
BASOS PCT: 1 %
Eosinophils Absolute: 0 10*3/uL (ref 0–0.7)
Eosinophils Relative: 0 %
HCT: 30.2 % — ABNORMAL LOW (ref 40.0–52.0)
HEMOGLOBIN: 9.9 g/dL — AB (ref 13.0–18.0)
Lymphocytes Relative: 7 %
Lymphs Abs: 0.6 10*3/uL — ABNORMAL LOW (ref 1.0–3.6)
MCH: 30 pg (ref 26.0–34.0)
MCHC: 32.8 g/dL (ref 32.0–36.0)
MCV: 91.5 fL (ref 80.0–100.0)
MONO ABS: 0.7 10*3/uL (ref 0.2–1.0)
Monocytes Relative: 7 %
NEUTROS ABS: 8.5 10*3/uL — AB (ref 1.4–6.5)
NEUTROS PCT: 85 %
Platelets: 218 10*3/uL (ref 150–440)
RBC: 3.3 MIL/uL — ABNORMAL LOW (ref 4.40–5.90)
RDW: 15.3 % — AB (ref 11.5–14.5)
WBC: 9.9 10*3/uL (ref 3.8–10.6)

## 2017-01-23 LAB — BLOOD GAS, ARTERIAL
Acid-base deficit: 5.9 mmol/L — ABNORMAL HIGH (ref 0.0–2.0)
BICARBONATE: 19.5 mmol/L — AB (ref 20.0–28.0)
Delivery systems: POSITIVE
EXPIRATORY PAP: 10
FIO2: 0.28
INSPIRATORY PAP: 20
MECHANICAL RATE: 12
O2 SAT: 97.2 %
PATIENT TEMPERATURE: 37
PH ART: 7.33 — AB (ref 7.350–7.450)
PO2 ART: 99 mmHg (ref 83.0–108.0)
pCO2 arterial: 37 mmHg (ref 32.0–48.0)

## 2017-01-23 LAB — COMPREHENSIVE METABOLIC PANEL
ALBUMIN: 3 g/dL — AB (ref 3.5–5.0)
ALT: 30 U/L (ref 17–63)
AST: 119 U/L — AB (ref 15–41)
Alkaline Phosphatase: 68 U/L (ref 38–126)
Anion gap: 12 (ref 5–15)
BILIRUBIN TOTAL: 0.4 mg/dL (ref 0.3–1.2)
BUN: 52 mg/dL — AB (ref 6–20)
CHLORIDE: 100 mmol/L — AB (ref 101–111)
CO2: 20 mmol/L — AB (ref 22–32)
Calcium: 8.5 mg/dL — ABNORMAL LOW (ref 8.9–10.3)
Creatinine, Ser: 4.92 mg/dL — ABNORMAL HIGH (ref 0.61–1.24)
GFR calc Af Amer: 14 mL/min — ABNORMAL LOW (ref >60)
GFR calc non Af Amer: 12 mL/min — ABNORMAL LOW (ref >60)
GLUCOSE: 182 mg/dL — AB (ref 65–99)
POTASSIUM: 4.3 mmol/L (ref 3.5–5.1)
Sodium: 132 mmol/L — ABNORMAL LOW (ref 135–145)
TOTAL PROTEIN: 7.3 g/dL (ref 6.5–8.1)

## 2017-01-23 LAB — PROTIME-INR
INR: 1.28
PROTHROMBIN TIME: 15.9 s — AB (ref 11.4–15.2)

## 2017-01-23 LAB — GLUCOSE, CAPILLARY
GLUCOSE-CAPILLARY: 129 mg/dL — AB (ref 65–99)
Glucose-Capillary: 152 mg/dL — ABNORMAL HIGH (ref 65–99)

## 2017-01-23 LAB — TROPONIN I
Troponin I: 62.91 ng/mL (ref <0.03)
Troponin I: 53.26 ng/mL (ref <0.03)

## 2017-01-23 LAB — MRSA PCR SCREENING: MRSA by PCR: NEGATIVE

## 2017-01-23 LAB — LACTIC ACID, PLASMA: Lactic Acid, Venous: 1.5 mmol/L (ref 0.5–1.9)

## 2017-01-23 LAB — C DIFFICILE QUICK SCREEN W PCR REFLEX
C Diff antigen: NEGATIVE
C Diff interpretation: NOT DETECTED
C Diff toxin: NEGATIVE

## 2017-01-23 LAB — MAGNESIUM: MAGNESIUM: 1.7 mg/dL (ref 1.7–2.4)

## 2017-01-23 LAB — HEPARIN LEVEL (UNFRACTIONATED)

## 2017-01-23 LAB — LIPASE, BLOOD: LIPASE: 22 U/L (ref 11–51)

## 2017-01-23 LAB — PHOSPHORUS: PHOSPHORUS: 3.8 mg/dL (ref 2.5–4.6)

## 2017-01-23 LAB — APTT: aPTT: 37 seconds — ABNORMAL HIGH (ref 24–36)

## 2017-01-23 MED ORDER — SODIUM CHLORIDE 0.9% FLUSH: 10.0000 mL | INTRAVENOUS | Status: DC | PRN

## 2017-01-23 MED ORDER — VANCOMYCIN HCL IN DEXTROSE 1-5 GM/200ML-% IV SOLN: 1000.0000 mg | INTRAVENOUS | Status: DC

## 2017-01-23 MED ORDER — SODIUM CHLORIDE 0.9 % IV SOLN
INTRAVENOUS | Status: DC
  Administered 2017-01-23: 17:00:00 via INTRAVENOUS

## 2017-01-23 MED ORDER — SODIUM CHLORIDE 0.9 % IV BOLUS (SEPSIS)
1000.0000 mL | Freq: Once | INTRAVENOUS | Status: AC
  Administered 2017-01-23: 1000 mL via INTRAVENOUS

## 2017-01-23 MED ORDER — SODIUM CHLORIDE 0.9% FLUSH
10.0000 mL | Freq: Two times a day (BID) | INTRAVENOUS | Status: DC
  Administered 2017-01-23 – 2017-01-24 (×2): 10 mL
  Administered 2017-01-24: 20 mL
  Administered 2017-01-25 (×2): 10 mL
  Administered 2017-01-26: 30 mL
  Administered 2017-01-26 – 2017-01-30 (×3): 10 mL

## 2017-01-23 MED ORDER — NITROGLYCERIN 0.4 MG SL SUBL: 0.4000 mg | SUBLINGUAL_TABLET | SUBLINGUAL | Status: DC | PRN

## 2017-01-23 MED ORDER — NOREPINEPHRINE BITARTRATE 1 MG/ML IV SOLN
0.0000 ug/min | INTRAVENOUS | Status: DC
  Administered 2017-01-23: 10 ug/min via INTRAVENOUS
  Administered 2017-01-23: 2 ug/min via INTRAVENOUS
  Administered 2017-01-24 (×2): 10 ug/min via INTRAVENOUS
  Filled 2017-01-23 (×4): qty 4

## 2017-01-23 MED ORDER — FENTANYL CITRATE (PF) 100 MCG/2ML IJ SOLN
50.0000 ug | Freq: Once | INTRAMUSCULAR | Status: AC
  Administered 2017-01-23: 50 ug via INTRAVENOUS
  Filled 2017-01-23: qty 2

## 2017-01-23 MED ORDER — ACETAMINOPHEN 325 MG PO TABS
650.0000 mg | ORAL_TABLET | ORAL | Status: DC | PRN
  Administered 2017-01-24 – 2017-01-25 (×2): 650 mg via ORAL
  Filled 2017-01-23 (×2): qty 2

## 2017-01-23 MED ORDER — PIPERACILLIN-TAZOBACTAM 3.375 G IVPB 30 MIN
3.3750 g | Freq: Once | INTRAVENOUS | Status: AC
  Administered 2017-01-23: 3.375 g via INTRAVENOUS
  Filled 2017-01-23: qty 50

## 2017-01-23 MED ORDER — SERTRALINE HCL 50 MG PO TABS
25.0000 mg | ORAL_TABLET | Freq: Every day | ORAL | Status: DC
  Administered 2017-01-24 – 2017-01-31 (×8): 25 mg via ORAL
  Filled 2017-01-23 (×9): qty 1

## 2017-01-23 MED ORDER — INSULIN ASPART 100 UNIT/ML ~~LOC~~ SOLN: 0.0000 [IU] | Freq: Every day | SUBCUTANEOUS | Status: DC

## 2017-01-23 MED ORDER — VANCOMYCIN HCL 500 MG IV SOLR: 500.0000 mg | Freq: Once | INTRAVENOUS | Status: DC

## 2017-01-23 MED ORDER — PIPERACILLIN-TAZOBACTAM 3.375 G IVPB
3.3750 g | Freq: Two times a day (BID) | INTRAVENOUS | Status: DC
  Administered 2017-01-23 – 2017-01-24 (×2): 3.375 g via INTRAVENOUS
  Filled 2017-01-23 (×2): qty 50

## 2017-01-23 MED ORDER — LEVOTHYROXINE SODIUM 50 MCG PO TABS
50.0000 ug | ORAL_TABLET | Freq: Every day | ORAL | Status: DC
  Administered 2017-01-24 – 2017-01-31 (×8): 50 ug via ORAL
  Filled 2017-01-23 (×8): qty 1

## 2017-01-23 MED ORDER — PIPERACILLIN-TAZOBACTAM 3.375 G IVPB: 3.3750 g | Freq: Three times a day (TID) | INTRAVENOUS | Status: DC

## 2017-01-23 MED ORDER — ASPIRIN 81 MG PO CHEW
324.0000 mg | CHEWABLE_TABLET | Freq: Once | ORAL | Status: AC
  Administered 2017-01-23: 324 mg via ORAL
  Filled 2017-01-23: qty 4

## 2017-01-23 MED ORDER — ASPIRIN EC 81 MG PO TBEC: 81.0000 mg | DELAYED_RELEASE_TABLET | Freq: Every day | ORAL | Status: DC

## 2017-01-23 MED ORDER — VANCOMYCIN HCL 10 G IV SOLR
1250.0000 mg | Freq: Once | INTRAVENOUS | Status: AC
  Administered 2017-01-23: 1250 mg via INTRAVENOUS
  Filled 2017-01-23: qty 1250

## 2017-01-23 MED ORDER — ONDANSETRON HCL 4 MG/2ML IJ SOLN: 4.0000 mg | Freq: Four times a day (QID) | INTRAMUSCULAR | Status: DC | PRN

## 2017-01-23 MED ORDER — SODIUM CHLORIDE 0.9 % IV BOLUS (SEPSIS)
500.0000 mL | Freq: Once | INTRAVENOUS | Status: AC
  Administered 2017-01-23: 500 mL via INTRAVENOUS

## 2017-01-23 MED ORDER — INSULIN ASPART 100 UNIT/ML ~~LOC~~ SOLN
0.0000 [IU] | Freq: Three times a day (TID) | SUBCUTANEOUS | Status: DC
  Administered 2017-01-23 – 2017-01-24 (×2): 1 [IU] via SUBCUTANEOUS
  Administered 2017-01-24 – 2017-01-25 (×4): 2 [IU] via SUBCUTANEOUS
  Administered 2017-01-25 – 2017-01-26 (×2): 1 [IU] via SUBCUTANEOUS
  Administered 2017-01-26 (×2): 2 [IU] via SUBCUTANEOUS
  Administered 2017-01-27 – 2017-01-30 (×9): 1 [IU] via SUBCUTANEOUS
  Administered 2017-01-30 (×2): 2 [IU] via SUBCUTANEOUS
  Administered 2017-01-31 (×2): 1 [IU] via SUBCUTANEOUS
  Filled 2017-01-23 (×23): qty 1

## 2017-01-23 MED ORDER — ASPIRIN EC 81 MG PO TBEC
81.0000 mg | DELAYED_RELEASE_TABLET | Freq: Every day | ORAL | Status: DC
  Administered 2017-01-24 – 2017-01-31 (×8): 81 mg via ORAL
  Filled 2017-01-23 (×11): qty 1

## 2017-01-23 MED ORDER — ATORVASTATIN CALCIUM 20 MG PO TABS
80.0000 mg | ORAL_TABLET | Freq: Every day | ORAL | Status: DC
  Administered 2017-01-24 – 2017-01-31 (×8): 80 mg via ORAL
  Filled 2017-01-23 (×10): qty 4

## 2017-01-23 MED ORDER — ALBUTEROL SULFATE (2.5 MG/3ML) 0.083% IN NEBU
3.0000 mL | INHALATION_SOLUTION | RESPIRATORY_TRACT | Status: DC | PRN
  Administered 2017-01-24: 3 mL via RESPIRATORY_TRACT
  Filled 2017-01-23: qty 3

## 2017-01-23 MED ORDER — VANCOMYCIN HCL 10 G IV SOLR: 1500.0000 mg | INTRAVENOUS | Status: DC

## 2017-01-23 MED ORDER — ACETAMINOPHEN 325 MG PO TABS
650.0000 mg | ORAL_TABLET | Freq: Once | ORAL | Status: AC
  Administered 2017-01-23: 650 mg via ORAL
  Filled 2017-01-23: qty 2

## 2017-01-23 MED ORDER — HEPARIN BOLUS VIA INFUSION
3300.0000 [IU] | Freq: Once | INTRAVENOUS | Status: AC
  Administered 2017-01-23: 3300 [IU] via INTRAVENOUS
  Filled 2017-01-23: qty 3300

## 2017-01-23 MED ORDER — GABAPENTIN 300 MG PO CAPS: 300.0000 mg | ORAL_CAPSULE | Freq: Three times a day (TID) | ORAL | Status: DC

## 2017-01-23 MED ORDER — GABAPENTIN 100 MG PO CAPS
100.0000 mg | ORAL_CAPSULE | Freq: Every day | ORAL | Status: DC
  Administered 2017-01-24 – 2017-01-30 (×7): 100 mg via ORAL
  Filled 2017-01-23 (×7): qty 1

## 2017-01-23 MED ORDER — HEPARIN (PORCINE) IN NACL 100-0.45 UNIT/ML-% IJ SOLN
2750.0000 [IU]/h | INTRAMUSCULAR | Status: DC
  Administered 2017-01-23: 1300 [IU]/h via INTRAVENOUS
  Administered 2017-01-23 – 2017-01-24 (×2): 1650 [IU]/h via INTRAVENOUS
  Administered 2017-01-25 (×2): 2000 [IU]/h via INTRAVENOUS
  Administered 2017-01-26: 2500 [IU]/h via INTRAVENOUS
  Administered 2017-01-26: 2200 [IU]/h via INTRAVENOUS
  Administered 2017-01-27: 2600 [IU]/h via INTRAVENOUS
  Administered 2017-01-27: 2500 [IU]/h via INTRAVENOUS
  Administered 2017-01-28 (×2): 2750 [IU]/h via INTRAVENOUS
  Filled 2017-01-23 (×11): qty 250

## 2017-01-23 MED ORDER — VANCOMYCIN HCL IN DEXTROSE 1-5 GM/200ML-% IV SOLN
1000.0000 mg | Freq: Once | INTRAVENOUS | Status: AC
  Administered 2017-01-23: 1000 mg via INTRAVENOUS
  Filled 2017-01-23: qty 200

## 2017-01-23 NOTE — Progress Notes (Addendum)
ANTIBIOTIC CONSULT NOTE - INITIAL  Pharmacy Consult for Zosyn and vancomycin Indication: sepsis  Allergies  Allergen Reactions  . Naproxen Rash    Patient Measurements: Height: 5\' 10"  (177.8 cm) Weight: (!) 320 lb (145.2 kg) IBW/kg (Calculated) : 73 Adjusted Body Weight: 101.8 kg  Vital Signs: Temp: 100.5 F (38.1 C) (12/02 1129) Temp Source: Oral (12/02 1129) BP: 93/71 (12/02 1129) Pulse Rate: 86 (12/02 1129) Intake/Output from previous day: No intake/output data recorded. Intake/Output from this shift: No intake/output data recorded.  Labs: Recent Labs    01/23/17 1144  WBC 9.9  HGB 9.9*  PLT 218  CREATININE 4.92*   Estimated Creatinine Clearance: 23.9 mL/min (A) (by C-G formula based on SCr of 4.92 mg/dL (H)). No results for input(s): VANCOTROUGH, VANCOPEAK, VANCORANDOM, GENTTROUGH, GENTPEAK, GENTRANDOM, TOBRATROUGH, TOBRAPEAK, TOBRARND, AMIKACINPEAK, AMIKACINTROU, AMIKACIN in the last 72 hours.   Microbiology: Recent Results (from the past 720 hour(s))  MRSA PCR Screening     Status: None   Collection Time: 12/30/16 12:36 AM  Result Value Ref Range Status   MRSA by PCR NEGATIVE NEGATIVE Final    Comment:        The GeneXpert MRSA Assay (FDA approved for NASAL specimens only), is one component of a comprehensive MRSA colonization surveillance program. It is not intended to diagnose MRSA infection nor to guide or monitor treatment for MRSA infections.     Medical History: Past Medical History:  Diagnosis Date  . CHF (congestive heart failure) ([place])   . COPD (chronic obstructive pulmonary disease) ([place])   . Diabetes mellitus type 2 in obese ([place])   . DM (diabetes mellitus) type II controlled with renal manifestation ([place])   . Hypercholesteremia   . Hypertension   . Morbid obesity with BMI of 45.0-49.9, adult ([place])   . Neuropathy   . Pancreatitis, acute   . Pneumonia   . Spinal stenosis     Medications:  Infusions:  . piperacillin-tazobactam     . piperacillin-tazobactam (ZOSYN)  IV    . sodium chloride    . [START ON 01/25/2017] vancomycin    . vancomycin    . vancomycin     Assessment: 56 yom cc weakness/fall. Question PNA vs ADHF - stable on Bipap for admit to ICU. Pharmacy consulted to dose Zosyn and vancomycin for sepsis. Pt is an ESRD pt on HD tue, thur,sat. Pt missed HD yesterday  Goal of Therapy:  Pre HD levels 15-25  Plan:  1. Zosyn 3.375 gm IV Q12H EI 2. Pt received 1g of vancomycin in the ED. I will give another 1250mg  for a total load of 2250mg . Vancomycin 1g AFTER every HD session T,T,Sat. Will need to follow closely in case pt receives HD off schedule due to missing a session yesterday Level prior to the 3rd HD session-will need to be ordered  Ramond Dial, Pharm.D, BCPS Clinical Pharmacist  01/23/2017,12:45 PM

## 2017-01-23 NOTE — ED Provider Notes (Signed)
[facility] Emergency Department Provider Note ____________________________________________   First MD Initiated Contact with Patient 01/23/17 1138     (approximate)  I have reviewed the triage vital signs and the nursing notes.   HISTORY  Chief Complaint Weakness   HPI Brian [name] is a 56 y.o. male history of congestive heart failure, COPD diabetes and recently initiated on dialysis.  Patient presents today reports that he got up from bed, he has chronic weakness in his lower legs with scissors.  Diabetes and neuropathy, all getting up he was feeling weak and tired and he ended up falling between a dresser in the bed.  He had to call 911 for assistance.  When EMS arrived they noticed a low-grade fever fatigue with mild hypotension and brought the patient for further evaluation.  At the present time he reports he feels very fatigued.  No shortness of breath.  He has a dry a fairly persistent cough that he has had for what he describes a month or 2.  No wheezing.  No headache.  He has noticed the last couple of days that he has had significant amount of nausea several episodes of vomiting of food water and also loose watery diarrhea.  No chest pain.  No shortness of breath.  Feeling somewhat better today but has ongoing fatigue   Past Medical History:  Diagnosis Date  . CHF (congestive heart failure) ([place])   . COPD (chronic obstructive pulmonary disease) ([place])   . Diabetes mellitus type 2 in obese ([place])   . DM (diabetes mellitus) type II controlled with renal manifestation ([place])   . Hypercholesteremia   . Hypertension   . Morbid obesity with BMI of 45.0-49.9, adult ([place])   . Neuropathy   . Pancreatitis, acute   . Pneumonia   . Spinal stenosis     Patient Active Problem List   Diagnosis Date Noted  . NSTEMI (non-ST elevated myocardial infarction) ([place]) 01/23/2017  . Acute on chronic diastolic CHF (congestive heart failure) ([place]) 12/29/2016  .  Lymphedema of both lower extremities 10/14/2016  . Obstructive sleep apnea 09/13/2016  . Chronic diastolic heart failure ([place]) 03/28/2016  . Tobacco use 03/28/2016  . Hyperkalemia 03/28/2016  . COPD (chronic obstructive pulmonary disease) ([place]) 02/08/2016  . DM (diabetes mellitus) type II controlled with renal manifestation ([place]) 01/05/2015  . Neuropathy ([place])   . Clinical depression 01/17/2013  . HLD (hyperlipidemia) 01/17/2013  . HTN (hypertension) 01/17/2013  . Gonalgia 01/17/2013  . Morbid obesity ([place]) 01/17/2013  . Amputated toe ([place]) 01/17/2013  . Diabetes mellitus ([place]) 01/17/2013    Past Surgical History:  Procedure Laterality Date  . AMPUTATION    . CHOLECYSTECTOMY  1998  . DIALYSIS/PERMA CATHETER INSERTION N/A 01/03/2017   Procedure: DIALYSIS/PERMA CATHETER INSERTION;  Surgeon: Algernon Huxley, MD;  Location: [facility];  Service: Cardiovascular;  Laterality: N/A;  . LAPAROSCOPIC APPENDECTOMY N/A 01/06/2015   Procedure: APPENDECTOMY LAPAROSCOPIC drainage of peritoneal abcess;  Surgeon: Sherri Rad, MD;  Location: [facility] ORS;  Service: General;  Laterality: N/A;    Prior to Admission medications   Medication Sig Start Date End Date Taking? Authorizing Provider  aspirin EC 81 MG EC tablet Take 1 tablet (81 mg total) by mouth daily. 02/29/16  Yes [name], MD  atorvastatin (LIPITOR) 80 MG tablet Take 80 mg daily by mouth.   Yes [provider]  carvedilol (COREG) 25 MG tablet Take 25 mg by mouth 2 (two) times daily with a  meal.   Yes [provider]  gabapentin (NEURONTIN) 300 MG capsule Take 1 capsule (300 mg total) 3 (three) times daily by mouth. Patient taking differently: Take 100 mg by mouth daily.  01/06/17  Yes Mody, Ulice Bold, MD  levothyroxine (SYNTHROID, LEVOTHROID) 50 MCG tablet Take 50 mcg daily before breakfast by mouth.   Yes [provider]  sertraline (ZOLOFT) 100 MG tablet Take 25 mg by mouth daily.  04/10/13  Yes [provider]  Tiotropium Bromide-Olodaterol (STIOLTO RESPIMAT) 2.5-2.5 MCG/ACT AERS Inhale 2 puffs into the lungs daily.    Yes [provider]  torsemide (DEMADEX) 100 MG tablet Take 1 tablet (100 mg total) daily by mouth. 01/06/17  Yes Mody, Sital, MD  albuterol (PROVENTIL HFA;VENTOLIN HFA) 108 (90 Base) MCG/ACT inhaler Inhale 1-2 puffs every 4 (four) hours as needed into the lungs for wheezing or shortness of breath.    [provider]  Nutritional Supplements (FEEDING SUPPLEMENT, NEPRO CARB STEADY,) LIQD Take 237 mLs 2 (two) times daily between meals by mouth. Patient not taking: Reported on 01/10/2017 01/06/17 02/05/17  Bettey Costa, MD  Vitamin D, Ergocalciferol, (DRISDOL) 50000 units CAPS capsule Take 50,000 Units by mouth every 7 (seven) days.    [provider]    Allergies Naproxen  Family History  Problem Relation Age of Onset  . Hypertension Mother   . Hyperlipidemia Mother   . Heart disease Father   . Heart disease Maternal Grandfather     Social History Social History   Tobacco Use  . Smoking status: Current Every Day Smoker    Packs/day: 1.00    Types: Cigarettes  . Smokeless tobacco: Never Used  Substance Use Topics  . Alcohol use: No  . Drug use: No    Review of Systems Constitutional: No fever that he is noticed, does report fatigue generalized worse than normal Eyes: No visual changes. ENT: No sore throat. Cardiovascular: Denies chest pain. Respiratory: Denies shortness of breath.  Frequent cough Gastrointestinal: No abdominal pain.    No constipation. Genitourinary: Negative for dysuria. Musculoskeletal: Negative for back pain. Skin: Negative for rash. Neurological: Negative for headaches, focal weakness or numbness.    ____________________________________________   PHYSICAL EXAM:  VITAL SIGNS: ED Triage Vitals [01/23/17 1129]  Enc Vitals Group     BP 93/71     Pulse Rate 86     Resp 18     Temp (!) 100.5 F  (38.1 C)     Temp Source Oral     SpO2 93 %     Weight (!) 320 lb (145.2 kg)     Height 5\' 10"  (1.778 m)     Head Circumference      Peak Flow      Pain Score      Pain Loc      Pain Edu?      Excl. in [place]?     Constitutional: Alert and oriented.  Chronically ill-appearing but in no distress.  He is very pleasant.  Eyes: Conjunctivae are normal. Head: Atraumatic. Nose: No congestion/rhinnorhea. Mouth/Throat: Mucous membranes are notably dry. Neck: No stridor.   Cardiovascular: Normal rate, regular rhythm. Grossly normal heart sounds.  Good peripheral circulation. Respiratory: Normal respiratory effort.  No retractions. Lungs CTAB. Gastrointestinal: Soft and nontender. No distention. Musculoskeletal: No lower extremity tenderness mild trace lower extremity edema bilateral Neurologic:  Normal speech and language. No gross focal neurologic deficits are appreciated sent for significant weakness in the lower extremities bilateral which he  reports is normal for him, normal strength in the upper extremities bilateral.  Skin:  Skin is warm, dry and intact. No rash noted. Psychiatric: Mood and affect are normal. Speech and behavior are normal.  ____________________________________________   LABS (all labs ordered are listed, but only abnormal results are displayed)  Labs Reviewed  CBC WITH DIFFERENTIAL/PLATELET - Abnormal; Notable for the following components:      Result Value   RBC 3.30 (*)    Hemoglobin 9.9 (*)    HCT 30.2 (*)    RDW 15.3 (*)    Neutro Abs 8.5 (*)    Lymphs Abs 0.6 (*)    All other components within normal limits  COMPREHENSIVE METABOLIC PANEL - Abnormal; Notable for the following components:   Sodium 132 (*)    Chloride 100 (*)    CO2 20 (*)    Glucose, Bld 182 (*)    BUN 52 (*)    Creatinine, Ser 4.92 (*)    Calcium 8.5 (*)    Albumin 3.0 (*)    AST 119 (*)    GFR calc non Af Amer 12 (*)    GFR calc Af Amer 14 (*)    All other components within normal  limits  TROPONIN I - Abnormal; Notable for the following components:   Troponin I >65.00 (*)    All other components within normal limits  BLOOD GAS, VENOUS - Abnormal; Notable for the following components:   Acid-base deficit 3.1 (*)    All other components within normal limits  APTT - Abnormal; Notable for the following components:   aPTT 37 (*)    All other components within normal limits  PROTIME-INR - Abnormal; Notable for the following components:   Prothrombin Time 15.9 (*)    All other components within normal limits  TROPONIN I - Abnormal; Notable for the following components:   Troponin I 62.91 (*)    All other components within normal limits  GLUCOSE, CAPILLARY - Abnormal; Notable for the following components:   Glucose-Capillary 129 (*)    All other components within normal limits  C DIFFICILE QUICK SCREEN W PCR REFLEX  MRSA PCR SCREENING  CULTURE, BLOOD (ROUTINE X 2)  CULTURE, BLOOD (ROUTINE X 2)  URINE CULTURE  LACTIC ACID, PLASMA  LIPASE, BLOOD  URINALYSIS, ROUTINE W REFLEX MICROSCOPIC  TROPONIN I  TROPONIN I  HEPARIN LEVEL (UNFRACTIONATED)  TSH  CBC  BASIC METABOLIC PANEL  LIPID PANEL  HEMOGLOBIN A1C   ____________________________________________  EKG  Reviewed and are by me at 12:30 AM Heart rate 80 Cures 100 QTC 410 Normal sinus rhythm, minimal T wave abnormality in V3 through VE6, minimal T wave inversion, no evidence of new ischemic change compared to EKG from November.  There is no ST elevation ____________________________________________  RADIOLOGY  Dg Chest 1 View  Result Date: 01/23/2017 CLINICAL DATA:  Central line placement. EXAM: CHEST 1 VIEW COMPARISON:  01/23/2017 and prior radiographs FINDINGS: A left IJ central venous catheter is now noted with tip overlying upper SVC. A right IJ central venous catheter is again noted with tip overlying the mid SVC. Cardiomegaly and pulmonary vascular congestion again identified. Slight decrease in  interstitial opacities/edema noted. There is no evidence of pneumothorax. IMPRESSION: Left IJ central venous catheter placement with tip overlying the upper SVC. No evidence of pneumothorax. Slight decrease in interstitial opacities/edema. Cardiomegaly and pulmonary vascular congestion. Electronically Signed   By: Margarette Canada M.D.   On: 01/23/2017 16:26   Dg Chest  Port 1 View  Result Date: 01/23/2017 CLINICAL DATA:  Cough, fever EXAM: PORTABLE CHEST 1 VIEW COMPARISON:  12/31/2016 FINDINGS: Right dialysis catheter in place with the tip at the cavoatrial junction. Cardiomegaly with diffuse interstitial opacities likely edema. No visible effusions or acute bony abnormality. IMPRESSION: Cardiomegaly. Diffuse interstitial opacities in the mid and lower lungs, likely edema/ CHF. Electronically Signed   By: Rolm Baptise M.D.   On: 01/23/2017 12:27   Chest x-ray reviewed may have some early edema or chf ____________________________________________   PROCEDURES  Procedure(s) performed: None  Procedures  Critical Care performed: Yes, see critical care note(s)   CRITICAL CARE Performed by: Delman Kitten   Total critical care time: 45 minutes  Critical care time was exclusive of separately billable procedures and treating other patients.  Critical care was necessary to treat or prevent imminent or life-threatening deterioration.  Critical care was time spent personally by me on the following activities: development of treatment plan with patient and/or surrogate as well as nursing, discussions with consultants, evaluation of patient's response to treatment, examination of patient, obtaining history from patient or surrogate, ordering and performing treatments and interventions, ordering and review of laboratory studies, ordering and review of radiographic studies, pulse oximetry and re-evaluation of patient's condition.  ____________________________________________   INITIAL IMPRESSION /  ASSESSMENT AND PLAN / ED COURSE  Pertinent labs & imaging results that were available during my care of the patient were reviewed by me and considered in my medical decision making (see chart for details).  Patient presented for evaluation of fatigue after falling.  He is noted to have moderate hypotension, no dyspnea, but is complaining of an episode of fairly severe nausea with vomiting that occurred Friday evening.  His EKG appears stable, however his troponin is notably elevated at greater than 65 suggestive of a recent acute coronary event.  He shows no evidence of acute STEMI.  He is however hypotensive and noted to be febrile with recent line placement on hemodialysis at high risk for bacteremia.  Lab work regarding CBC is reassuring, however given his recent history will empirically treat him for antibiotics, draw blood cultures, I have ordered and the patient has been seen by both nephrology as well as cardiology.  Defer to cardiology on decision for anticoagulation.  Nephrology to arrange his hemodialysis in agreement with antibiotics.  Patient will be admitted to the ICU.  Patient was responsive to fluid bolus, and blood pressure has improved.  He is alert respirating comfortably without distress, never complained of any chest pain or respiratory symptoms.  He appears appropriate for admission to the intensive care unit for ongoing monitoring  Clinical Course as of Jan 24 1919  Sun Jan 23, 2017  1204 Nephrology paged.   [MQ]  1220 Dr. Abigail Butts plans to see patient in ED for consult recommendations.   [MQ]  1233 Dr. Ubaldo Glassing advises against heparin at this time. Recommends admit.   [MQ]    Clinical Course User Index [MQ] Delman Kitten, MD     ____________________________________________   FINAL CLINICAL IMPRESSION(S) / ED DIAGNOSES  Non ST Elevation MI Sepsis Hypotension, severe Fever   NEW MEDICATIONS STARTED DURING THIS VISIT:  This SmartLink is deprecated. Use AVSMEDLIST  instead to display the medication list for a patient.   Note:  This document was prepared using Dragon voice recognition software and may include unintentional dictation errors.     Delman Kitten, MD 01/23/17 [phone]

## 2017-01-23 NOTE — ED Notes (Signed)
Troponin result notified to Dr. Jacqualine Code

## 2017-01-23 NOTE — ED Notes (Signed)
Patient's monitor alarming due to low blood pressure. Fluids being administered at this time. Primary RN notified.

## 2017-01-23 NOTE — Consult Note (Signed)
[facility]  CARDIOLOGY CONSULT NOTE  Patient ID: Brian [name] MRN: [id number] DOB/AGE: 57/18/1962 57 y.o.  Admit date: 01/23/2017 Referring Physician Dr. Margaretmary Eddy Primary Physician  Threasa Alpha NP Primary Cardiologist Dr. Saralyn Pilar Reason for Consultation nstemi  HPI: Pt is a 56 yo male with history of esrd on HD who was admitted after presenting to the er with complaints of several days of weakness, nausea and fatigue. He felt too poorly to undergo his hd yesterday but did dialyze Tuesday and Thursday per his usual. He denied chest pain. He has history of normal lv funciotn with echo recently showing normal lv function. He was recently admitted to [facility] with sob and volume overload and was felt to have HFpEF. He has been tre3ated with carvedilol and torsemide and makes some urine. In the er his ekg showed no ischemia. HIs serum troponin was greater than 65. K was 4.3, BUN/Cr  52/4.92. . CXR showed interstitial opacities in the mid and lower lung fields c/w pulmonary edema. He was hyotensive but responsive. MAP 60-62. He was noted to be febrile on presentation with lactate of 1.5, hgb 9.9. He recently had a dialysis permacath placed.   Review of Systems  Constitutional: Positive for fever and malaise/fatigue.  HENT: Negative.   Eyes: Negative.   Cardiovascular: Negative.   Gastrointestinal: Positive for nausea and vomiting.  Genitourinary: Negative.   Musculoskeletal: Positive for myalgias.  Skin: Negative.   Neurological: Positive for weakness.  Endo/Heme/Allergies: Negative.   Psychiatric/Behavioral: Negative.     Past Medical History:  Diagnosis Date  . CHF (congestive heart failure) ([place])   . COPD (chronic obstructive pulmonary disease) ([place])   . Diabetes mellitus type 2 in obese ([place])   . DM (diabetes mellitus) type II controlled with renal manifestation ([place])   . Hypercholesteremia   . Hypertension   . Morbid obesity with  BMI of 45.0-49.9, adult ([place])   . Neuropathy   . Pancreatitis, acute   . Pneumonia   . Spinal stenosis     Family History  Problem Relation Age of Onset  . Hypertension Mother   . Hyperlipidemia Mother   . Heart disease Father   . Heart disease Maternal Grandfather     Social History   Socioeconomic History  . Marital status: Married    Spouse name: Not on file  . Number of children: Not on file  . Years of education: Not on file  . Highest education level: Not on file  Social Needs  . Financial resource strain: Not on file  . Food insecurity - worry: Not on file  . Food insecurity - inability: Not on file  . Transportation needs - medical: Not on file  . Transportation needs - non-medical: Not on file  Occupational History  . Not on file  Tobacco Use  . Smoking status: Current Every Day Smoker    Packs/day: 1.00    Types: Cigarettes  . Smokeless tobacco: Never Used  Substance and Sexual Activity  . Alcohol use: No  . Drug use: No  . Sexual activity: Not on file  Other Topics Concern  . Not on file  Social History Narrative  . Not on file    Past Surgical History:  Procedure Laterality Date  . AMPUTATION    . CHOLECYSTECTOMY  1998  . DIALYSIS/PERMA CATHETER INSERTION N/A 01/03/2017   Procedure: DIALYSIS/PERMA CATHETER INSERTION;  Surgeon: Algernon Huxley, MD;  Location: [facility];  Service: Cardiovascular;  Laterality: N/A;  . LAPAROSCOPIC APPENDECTOMY N/A 01/06/2015   Procedure: APPENDECTOMY LAPAROSCOPIC drainage of peritoneal abcess;  Surgeon: Sherri Rad, MD;  Location: [facility] ORS;  Service: General;  Laterality: N/A;     Medications Prior to Admission  Medication Sig Dispense Refill Last Dose  . aspirin EC 81 MG EC tablet Take 1 tablet (81 mg total) by mouth daily. 30 tablet 0 01/22/2017 at Unknown time  . atorvastatin (LIPITOR) 80 MG tablet Take 80 mg daily by mouth.   01/22/2017 at Unknown time  . carvedilol (COREG) 25 MG tablet Take 25 mg by mouth 2  (two) times daily with a meal.   01/22/2017 at Unknown time  . gabapentin (NEURONTIN) 300 MG capsule Take 1 capsule (300 mg total) 3 (three) times daily by mouth. (Patient taking differently: Take 100 mg by mouth daily. ) 30 capsule 0 01/22/2017 at Unknown time  . levothyroxine (SYNTHROID, LEVOTHROID) 50 MCG tablet Take 50 mcg daily before breakfast by mouth.   01/22/2017 at Unknown time  . sertraline (ZOLOFT) 100 MG tablet Take 25 mg by mouth daily.    01/22/2017 at Unknown time  . Tiotropium Bromide-Olodaterol (STIOLTO RESPIMAT) 2.5-2.5 MCG/ACT AERS Inhale 2 puffs into the lungs daily.    01/22/2017 at Unknown time  . torsemide (DEMADEX) 100 MG tablet Take 1 tablet (100 mg total) daily by mouth. 30 tablet 0 01/22/2017 at Unknown time  . albuterol (PROVENTIL HFA;VENTOLIN HFA) 108 (90 Base) MCG/ACT inhaler Inhale 1-2 puffs every 4 (four) hours as needed into the lungs for wheezing or shortness of breath.   prn at prn  . Nutritional Supplements (FEEDING SUPPLEMENT, NEPRO CARB STEADY,) LIQD Take 237 mLs 2 (two) times daily between meals by mouth. (Patient not taking: Reported on 01/10/2017) 14220 mL 0 Not Taking at Unknown time  . Vitamin D, Ergocalciferol, (DRISDOL) 50000 units CAPS capsule Take 50,000 Units by mouth every 7 (seven) days.   01/16/2017    Physical Exam: Blood pressure (!) 82/51, pulse 61, temperature 98.4 F (36.9 C), temperature source Oral, resp. rate (!) 22, height 5\' 10"  (1.778 m), weight (!) 149.5 kg (329 lb 9.4 oz), SpO2 96 %.   Wt Readings from Last 1 Encounters:  01/23/17 (!) 149.5 kg (329 lb 9.4 oz)     General appearance: alert and cooperative Resp: rales bibasilar Cardio: regular rate and rhythm GI: soft, non-tender; bowel sounds normal; no masses,  no organomegaly Extremities: extremities normal, atraumatic, no cyanosis or edema Neurologic: Grossly normal  Labs:   Lab Results  Component Value Date   WBC 9.9 01/23/2017   HGB 9.9 (L) 01/23/2017   HCT 30.2 (L)  01/23/2017   MCV 91.5 01/23/2017   PLT 218 01/23/2017    Recent Labs  Lab 01/23/17 1144  NA 132*  K 4.3  CL 100*  CO2 20*  BUN 52*  CREATININE 4.92*  CALCIUM 8.5*  PROT 7.3  BILITOT 0.4  ALKPHOS 68  ALT 30  AST 119*  GLUCOSE 182*   Lab Results  Component Value Date   TROPONINI 62.91 ([place]) 01/23/2017        ASSESSMENT AND PLAN:  56 yo male with history of esrd on hd with recent perma cath placed in 11/18 who was admitted with progressive weakness, nausea and fatigue. He missed HD yesterday an dwas too weak to walk today and called ems. THey noted him to be febrile and hypotensive and he was brougt to the er whee he was hyotensive. He received  iv fluids with some improvement. He denied chest pain. CXR showed mild pulmonary edema. CKG showed no ischemia but serum troponin was >65. Likely had a nstemi several days ago. DOes not appear acaute given very high troponin. Had normal lv funciton by echo approximately 1 year ago. Will place on heparin and pressors as needed. Will evaluate echo when available and follow for evidence of new wall motion abnormality. Given temp will follow cultures and use emperic abx. Consider cath when septic picture improves to evaluate anatomy. Not candidate for beta blockers and ace I given hemodynamics. WIll need hd.  Signed: [name] MD, [facility] 01/23/2017, 4:20 PM

## 2017-01-23 NOTE — Consult Note (Signed)
[facility] Consultation      Name: Brian [name] MRN: [id number] DOB: 1960-09-15    ADMISSION DATE:  01/23/2017 CONSULTATION DATE:  01/23/17  REFERRING MD :  Dr. Margaretmary Eddy   CHIEF COMPLAINT:   Generalized weakness   HISTORY OF PRESENT ILLNESS   56 years old gentleman with a past medical history of end-stage renal disease-on hemodialysis, diastolic CHF, acute PD, recent hospitalization to the hospital with CHF exacerbation who presented to the ER today with a several day history of weakness, fatigue, nausea and diarrhea.  She had recently been discharged from the hospital after an inpatient stay for volume overload that was thought to be from CHF.  The patient gets dialyzed Tuesday Thursday and Saturday but missed his dialysis yesterday because of his symptoms. In the ER the patient was found to be hypotensive with systolic blood pressure in the 70s.  He was given 1.5 L of IV fluids.  He was also given vancomycin and Zosyn for presumed sepsis. The patient was found to have troponin levels of 65. His lactic acid levels were normal. He did not have leukocytosis. Chest x-ray showed diffuse interstitial infiltrates that are more consistent with edema.  In the emergency room the patient was given vancomycin and Zosyn as well as IV fluids mentioned above. He was started on high-dose aspirin and statins as well as heparin drip per cardiology recommendation. Beta blockers were not initiated given his hypotension..  The patient was transferred to the ICU where given his persistent hypotension, central line was placed and the patient was started on Levophed..  In the ICU the patient denies chest pain, chest pressure, nausea, vomiting, fevers, shortness of breath.  He endorses generalized fatigue as well as diarrhea that had been going on for the past few days  .CU the patient denied chest pain, chest pressure, nausea, vomiting, shortness of breath.  E emergency room the  patient was given vancomycin and Zosyn as well as fluids as mentioned as started on aspirin, high-dose statins and heparin drip.  He was not  PAST MEDICAL HISTORY    :  Past Medical History:  Diagnosis Date  . CHF (congestive heart failure) ([place])   . COPD (chronic obstructive pulmonary disease) ([place])   . Diabetes mellitus type 2 in obese ([place])   . DM (diabetes mellitus) type II controlled with renal manifestation ([place])   . Hypercholesteremia   . Hypertension   . Morbid obesity with BMI of 45.0-49.9, adult ([place])   . Neuropathy   . Pancreatitis, acute   . Pneumonia   . Spinal stenosis    Past Surgical History:  Procedure Laterality Date  . AMPUTATION    . CHOLECYSTECTOMY  1998  . DIALYSIS/PERMA CATHETER INSERTION N/A 01/03/2017   Procedure: DIALYSIS/PERMA CATHETER INSERTION;  Surgeon: Algernon Huxley, MD;  Location: [facility];  Service: Cardiovascular;  Laterality: N/A;  . LAPAROSCOPIC APPENDECTOMY N/A 01/06/2015   Procedure: APPENDECTOMY LAPAROSCOPIC drainage of peritoneal abcess;  Surgeon: Sherri Rad, MD;  Location: [facility];  Service: General;  Laterality: N/A;   Prior to Admission medications   Medication Sig Start Date End Date Taking? Authorizing Provider  aspirin EC 81 MG EC tablet Take 1 tablet (81 mg total) by mouth daily. 02/29/16  Yes [name], MD  atorvastatin (LIPITOR) 80 MG tablet Take 80 mg daily by mouth.   Yes [provider]  carvedilol (COREG) 25 MG tablet Take 25 mg by mouth 2 (two) times daily with a  meal.   Yes [provider]  gabapentin (NEURONTIN) 300 MG capsule Take 1 capsule (300 mg total) 3 (three) times daily by mouth. Patient taking differently: Take 100 mg by mouth daily.  01/06/17  Yes Mody, Ulice Bold, MD  levothyroxine (SYNTHROID, LEVOTHROID) 50 MCG tablet Take 50 mcg daily before breakfast by mouth.   Yes [provider]  sertraline (ZOLOFT) 100 MG tablet Take 25 mg by mouth daily.  04/10/13  Yes [provider]  Tiotropium Bromide-Olodaterol (STIOLTO RESPIMAT) 2.5-2.5 MCG/ACT AERS Inhale 2 puffs into the lungs daily.    Yes [provider]  torsemide (DEMADEX) 100 MG tablet Take 1 tablet (100 mg total) daily by mouth. 01/06/17  Yes Mody, Sital, MD  albuterol (PROVENTIL HFA;VENTOLIN HFA) 108 (90 Base) MCG/ACT inhaler Inhale 1-2 puffs every 4 (four) hours as needed into the lungs for wheezing or shortness of breath.    [provider]  Nutritional Supplements (FEEDING SUPPLEMENT, NEPRO CARB STEADY,) LIQD Take 237 mLs 2 (two) times daily between meals by mouth. Patient not taking: Reported on 01/10/2017 01/06/17 02/05/17  Bettey Costa, MD  Vitamin D, Ergocalciferol, (DRISDOL) 50000 units CAPS capsule Take 50,000 Units by mouth every 7 (seven) days.    [provider]   Allergies  Allergen Reactions  . Naproxen Rash     FAMILY HISTORY   Family History  Problem Relation Age of Onset  . Hypertension Mother   . Hyperlipidemia Mother   . Heart disease Father   . Heart disease Maternal Grandfather       SOCIAL HISTORY    reports that he has been smoking cigarettes.  He has been smoking about 1.00 pack per day. he has never used smokeless tobacco. He reports that he does not drink alcohol or use drugs.  ROS  Negative except for that mentioned in HPI    VITAL SIGNS    Temp:  [97.6 F (36.4 C)-100.5 F (38.1 C)] 97.7 F (36.5 C) (12/02 2000) Pulse Rate:  [55-86] 69 (12/02 2100) Resp:  [13-22] 17 (12/02 2100) BP: (76-115)/(46-91) 115/91 (12/02 2100) SpO2:  [93 %-100 %] 100 % (12/02 2100) Weight:  [320 lb (145.2 kg)-329 lb 9.4 oz (149.5 kg)] 329 lb 9.4 oz (149.5 kg) (12/02 1434) HEMODYNAMICS: CVP:  [9 mmHg-16 mmHg] 15 mmHg VENTILATOR SETTINGS:   INTAKE / OUTPUT:  Intake/Output Summary (Last 24 hours) at 01/23/2017 2133 Last data filed at 01/23/2017 2000 Gross per 24 hour  Intake 328.7 ml  Output -  Net 328.7 ml       PHYSICAL EXAM   Somnolent  but easily arousable.  Oriented. Follows commands. Answers questions appropriately. Sclera anicteric. Oral mucosa moist. CVS S1, S2, 0. Chest examination had bilateral basilar crackles. Abdomen is obese, soft, nontender. Right subclavian dialysis catheter site looks clean.    LABS   LABS:  CBC Recent Labs  Lab 01/23/17 1144  WBC 9.9  HGB 9.9*  HCT 30.2*  PLT 218   Coag's Recent Labs  Lab 01/23/17 1144  APTT 37*  INR 1.28   BMET Recent Labs  Lab 01/23/17 1144  NA 132*  K 4.3  CL 100*  CO2 20*  BUN 52*  CREATININE 4.92*  GLUCOSE 182*   Electrolytes Recent Labs  Lab 01/23/17 1144  CALCIUM 8.5*   Sepsis Markers Recent Labs  Lab 01/23/17 1144  LATICACIDVEN 1.5   ABG No results for input(s): PHART, PCO2ART, PO2ART in the last 168 hours. Liver Enzymes Recent Labs  Lab 01/23/17  1144  AST 119*  ALT 30  ALKPHOS 68  BILITOT 0.4  ALBUMIN 3.0*   Cardiac Enzymes Recent Labs  Lab 01/23/17 1144 01/23/17 1509  TROPONINI >65.00* 62.91*   Glucose Recent Labs  Lab 01/23/17 1707  GLUCAP 129*     Recent Results (from the past 240 hour(s))  C difficile quick scan w PCR reflex     Status: None   Collection Time: 01/23/17  1:24 PM  Result Value Ref Range Status   C Diff antigen NEGATIVE NEGATIVE Final   C Diff toxin NEGATIVE NEGATIVE Final   C Diff interpretation No C. difficile detected.  Final  MRSA PCR Screening     Status: None   Collection Time: 01/23/17  2:57 PM  Result Value Ref Range Status   MRSA by PCR NEGATIVE NEGATIVE Final    Comment:        The GeneXpert MRSA Assay (FDA approved for NASAL specimens only), is one component of a comprehensive MRSA colonization surveillance program. It is not intended to diagnose MRSA infection nor to guide or monitor treatment for MRSA infections.      Current Facility-Administered Medications:  .  acetaminophen (TYLENOL) tablet 650 mg, 650 mg, Oral, Q4H PRN, Gouru, Aruna, MD .  albuterol  (PROVENTIL) (2.5 MG/3ML) 0.083% nebulizer solution 3 mL, 3 mL, Inhalation, Q4H PRN, Gouru, Aruna, MD .  Derrill Memo ON 01/24/2017] aspirin EC tablet 81 mg, 81 mg, Oral, Daily, Gouru, Aruna, MD .  atorvastatin (LIPITOR) tablet 80 mg, 80 mg, Oral, Daily, Gouru, Aruna, MD .  gabapentin (NEURONTIN) capsule 100 mg, 100 mg, Oral, QHS, Gouru, Aruna, MD .  heparin ADULT infusion 100 units/mL (25000 units/238mL sodium chloride 0.45%), 1,300 Units/hr, Intravenous, Continuous, Maccia, Melissa D, RPH, Last Rate: 13 mL/hr at 01/23/17 1800, 1,300 Units/hr at 01/23/17 1800 .  insulin aspart (novoLOG) injection 0-5 Units, 0-5 Units, Subcutaneous, QHS, Gouru, Aruna, MD .  insulin aspart (novoLOG) injection 0-9 Units, 0-9 Units, Subcutaneous, TID WC, Gouru, Aruna, MD, 1 Units at 01/23/17 1715 .  [START ON 01/24/2017] levothyroxine (SYNTHROID, LEVOTHROID) tablet 50 mcg, 50 mcg, Oral, QAC breakfast, Gouru, Aruna, MD .  nitroGLYCERIN (NITROSTAT) SL tablet 0.4 mg, 0.4 mg, Sublingual, Q5 Min x 3 PRN, Gouru, Aruna, MD .  norepinephrine (LEVOPHED) 4 mg in dextrose 5 % 250 mL (0.016 mg/mL) infusion, 0-40 mcg/min, Intravenous, Titrated, [name], MD, Last Rate: 37.5 mL/hr at 01/23/17 2129, 10 mcg/min at 01/23/17 2129 .  ondansetron (ZOFRAN) injection 4 mg, 4 mg, Intravenous, Q6H PRN, Gouru, Aruna, MD .  piperacillin-tazobactam (ZOSYN) IVPB 3.375 g, 3.375 g, Intravenous, Q12H, Ramond Dial, RPH, Stopped at 01/23/17 2115 .  sertraline (ZOLOFT) tablet 25 mg, 25 mg, Oral, Daily, Gouru, Aruna, MD .  sodium chloride flush (NS) 0.9 % injection 10-40 mL, 10-40 mL, Intracatheter, Q12H, [name], MD, 10 mL at 01/23/17 2131 .  sodium chloride flush (NS) 0.9 % injection 10-40 mL, 10-40 mL, Intracatheter, PRN, [name], MD .  Derrill Memo ON 01/25/2017] vancomycin (VANCOCIN) IVPB 1000 mg/200 mL premix, 1,000 mg, Intravenous, Q T,Th,Sa-HD, Ramond Dial, RPH  IMAGING    Dg Chest 1 View  Result Date: 01/23/2017 CLINICAL DATA:   Central line placement. EXAM: CHEST 1 VIEW COMPARISON:  01/23/2017 and prior radiographs FINDINGS: A left IJ central venous catheter is now noted with tip overlying upper SVC. A right IJ central venous catheter is again noted with tip overlying the mid SVC. Cardiomegaly and pulmonary vascular congestion again identified. Slight decrease in interstitial  opacities/edema noted. There is no evidence of pneumothorax. IMPRESSION: Left IJ central venous catheter placement with tip overlying the upper SVC. No evidence of pneumothorax. Slight decrease in interstitial opacities/edema. Cardiomegaly and pulmonary vascular congestion. Electronically Signed   By: Margarette Canada M.D.   On: 01/23/2017 16:26   Dg Chest Port 1 View  Result Date: 01/23/2017 CLINICAL DATA:  Cough, fever EXAM: PORTABLE CHEST 1 VIEW COMPARISON:  12/31/2016 FINDINGS: Right dialysis catheter in place with the tip at the cavoatrial junction. Cardiomegaly with diffuse interstitial opacities likely edema. No visible effusions or acute bony abnormality. IMPRESSION: Cardiomegaly. Diffuse interstitial opacities in the mid and lower lungs, likely edema/ CHF. Electronically Signed   By: Rolm Baptise M.D.   On: 01/23/2017 12:27       MICRO DATA: MRSA PCR  Urine  Blood Resp     ASSESSMENT/PLAN   56 years old gentleman with a past medical history of end-stage renal disease-on hemodialysis, diastolic CHF, acute PD, recent hospitalization to the hospital with CHF exacerbation who presented to the ER today with a several day history of weakness, fatigue, nausea and diarrhea.  Problem list:  Shock-?  Septic?  Cardiogenic or combination of both End-stage renal disease-on dialysis Tuesday Thursday Saturday ACS Anemia of chronic disease History of COPD History of CHF History of diabetes History of dyslipidemia History of hypertension Morbid obesity Ongoing tobacco abuse Spinal stenosis Secondary hyperparathyroidism OSA/OHS-on home  CPAP   Patient was admitted to the ICU  A left IJ central venous catheter was placed and the patient was started on Levophed. Given his end-stage renal disease as well as history of CHF, we are being cautious with IV fluids.  CVP reads 13 hence IV fluids were stopped and vasopressors were continued. We will closely monitor. Agree with vancomycin and Zosyn. Follow cultures. Titrate Levophed to keep mean arterial pressure more than 65.  Cardiology on board for acute coronary syndrome.  Appreciate input. Continue aspirin, statins, heparin drip. Cannot give beta-blocker secondary to hypotension. Trend troponins. Check echocardiogram. Holding home Coreg and torsemide given his hypotension Management of ACS per cardiology.  Continue albuterol every 4 hours as needed.  Nephrology on board.  Dialysis per nephrology.  Appreciate assistance.  Anemia of chronic disease.  Monitor hemoglobin daily.  Transfuse for hemoglobin less than 7.  Continue gabapentin Continue Synthroid Continue Zoloft  Insulin sliding scale for glycemic control  Patient is on CPAP at home for OHS/OSA.  We will place him on BiPAP here.   Discussed at length in person with the patient's wife and patient's mother.  All the questions were answered  I have personally obtained a history, examined the patient, evaluated laboratory and independently reviewed  imaging results, formulated the assessment and plan and placed orders.  The Patient requires high complexity decision making for assessment and support, frequent evaluation and titration of therapies, application of advanced monitoring technologies and extensive interpretation of multiple databases. Critical Care Time devoted to patient care services described in this note is 65 minutes excluding procedures.     [name], M.D.  Pulmonary and Critical Care Medicine

## 2017-01-23 NOTE — H&P (Signed)
[facility] NAME: Brian [name]    MR#:  [id number]  DATE OF BIRTH:  13-Dec-1960  DATE OF ADMISSION:  01/23/2017  PRIMARY CARE PHYSICIAN: Lorelee Market, MD   REQUESTING/REFERRING PHYSICIAN: [place]  CHIEF COMPLAINT:  weakness  HISTORY OF PRESENT ILLNESS:  Brian [name]  is a 56 y.o. male with a known history of COPD, chronic diastolic congestive heart failure, end-stage renal disease recently started on hemodialysis was just admitted to hospital on 12/29/2016 and discharged on 01/06/2017 with CHF exacerbation is presenting to the ED with a chief complaint of generalized weakness. By the time he came into the hospital he is hypotensive. Patient's initial troponin is at 65 EKG with no ST elevations. Patient is fluid overloaded . Denies any chest pain or abdominal pain SHORTNESS of breath. Her resting comfortably during my examination. Discussed with on-call cardiologist Dr. Ubaldo Glassing is coming to see the patient and he recommended no heparin bolus but okay to give drip. Wife at bedside. Reporting that he is noncompliant with CPAP for sleep apnea. Patient is a petite reporting nausea vomiting and diarrhea for 2 days and missed his dialysis yesterday  PAST MEDICAL HISTORY:   Past Medical History:  Diagnosis Date  . CHF (congestive heart failure) ([place])   . COPD (chronic obstructive pulmonary disease) ([place])   . Diabetes mellitus type 2 in obese ([place])   . DM (diabetes mellitus) type II controlled with renal manifestation ([place])   . Hypercholesteremia   . Hypertension   . Morbid obesity with BMI of 45.0-49.9, adult ([place])   . Neuropathy   . Pancreatitis, acute   . Pneumonia   . Spinal stenosis     PAST SURGICAL HISTOIRY:   Past Surgical History:  Procedure Laterality Date  . AMPUTATION    . CHOLECYSTECTOMY  1998  . DIALYSIS/PERMA CATHETER INSERTION N/A 01/03/2017   Procedure: DIALYSIS/PERMA CATHETER INSERTION;  Surgeon: Algernon Huxley, MD;  Location: [facility];  Service: Cardiovascular;  Laterality: N/A;  . LAPAROSCOPIC APPENDECTOMY N/A 01/06/2015   Procedure: APPENDECTOMY LAPAROSCOPIC drainage of peritoneal abcess;  Surgeon: Sherri Rad, MD;  Location: [facility];  Service: General;  Laterality: N/A;    SOCIAL HISTORY:   Social History   Tobacco Use  . Smoking status: Current Every Day Smoker    Packs/day: 1.00    Types: Cigarettes  . Smokeless tobacco: Never Used  Substance Use Topics  . Alcohol use: No    FAMILY HISTORY:   Family History  Problem Relation Age of Onset  . Hypertension Mother   . Hyperlipidemia Mother   . Heart disease Father   . Heart disease Maternal Grandfather     DRUG ALLERGIES:   Allergies  Allergen Reactions  . Naproxen Rash    REVIEW OF SYSTEMS:  CONSTITUTIONAL: No fever, fatigue or weakness.  EYES: No blurred or double vision.  EARS, NOSE, AND THROAT: No tinnitus or ear pain.  RESPIRATORY: No cough, shortness of breath, wheezing or hemoptysis.  CARDIOVASCULAR: No chest pain, orthopnea, edema.  GASTROINTESTINAL: Reporting nausea, vomiting, diarrhea , denies abdominal pain.  GENITOURINARY: No dysuria, hematuria.  ENDOCRINE: No polyuria, nocturia,  HEMATOLOGY: No anemia, easy bruising or bleeding SKIN: No rash or lesion. MUSCULOSKELETAL: Swelling of the lower extremities No joint pain or arthritis.   NEUROLOGIC: No tingling, numbness, weakness.  PSYCHIATRY: No anxiety or depression.   MEDICATIONS AT HOME:   Prior to Admission medications   Medication Sig Start Date  End Date Taking? Authorizing Provider  aspirin EC 81 MG EC tablet Take 1 tablet (81 mg total) by mouth daily. 02/29/16  Yes [name], MD  atorvastatin (LIPITOR) 80 MG tablet Take 80 mg daily by mouth.   Yes [provider]  carvedilol (COREG) 25 MG tablet Take 25 mg by mouth 2 (two) times daily with a meal.   Yes [provider]  gabapentin (NEURONTIN) 300 MG capsule Take  1 capsule (300 mg total) 3 (three) times daily by mouth. Patient taking differently: Take 100 mg by mouth daily.  01/06/17  Yes Mody, Ulice Bold, MD  levothyroxine (SYNTHROID, LEVOTHROID) 50 MCG tablet Take 50 mcg daily before breakfast by mouth.   Yes [provider]  sertraline (ZOLOFT) 100 MG tablet Take 25 mg by mouth daily.  04/10/13  Yes [provider]  Tiotropium Bromide-Olodaterol (STIOLTO RESPIMAT) 2.5-2.5 MCG/ACT AERS Inhale 2 puffs into the lungs daily.    Yes [provider]  torsemide (DEMADEX) 100 MG tablet Take 1 tablet (100 mg total) daily by mouth. 01/06/17  Yes Mody, Sital, MD  albuterol (PROVENTIL HFA;VENTOLIN HFA) 108 (90 Base) MCG/ACT inhaler Inhale 1-2 puffs every 4 (four) hours as needed into the lungs for wheezing or shortness of breath.    [provider]  Nutritional Supplements (FEEDING SUPPLEMENT, NEPRO CARB STEADY,) LIQD Take 237 mLs 2 (two) times daily between meals by mouth. Patient not taking: Reported on 01/10/2017 01/06/17 02/05/17  Bettey Costa, MD  Vitamin D, Ergocalciferol, (DRISDOL) 50000 units CAPS capsule Take 50,000 Units by mouth every 7 (seven) days.    [provider]      VITAL SIGNS:  Blood pressure 102/62, pulse 82, temperature (!) 100.5 F (38.1 C), temperature source Oral, resp. rate 18, height 5\' 10"  (1.778 m), weight (!) 145.2 kg (320 lb), SpO2 98 %.  PHYSICAL EXAMINATION:  GENERAL:  56 y.o.-year-old patient lying in the bed with no acute distress.  EYES: Pupils equal, round, reactive to light and accommodation. No scleral icterus. Extraocular muscles intact.  HEENT: Head atraumatic, normocephalic. Oropharynx and nasopharynx clear.  NECK:  Supple, no jugular venous distention. No thyroid enlargement, no tenderness.  LUNGS: Moderate breath sounds bilaterally, no wheezing, rales,rhonchi or crepitation. No use of accessory muscles of respiration. Anterior chest wall with permacath, no  erythema CARDIOVASCULAR: S1, S2 normal. No murmurs, rubs, or gallops.  ABDOMEN: Soft, nontender, nondistended. Bowel sounds present. No organomegaly or mass.  EXTREMITIES: No pedal edema, cyanosis, or clubbing.  NEUROLOGIC: Cranial nerves II through XII are intact. Muscle strength 5/5 in all extremities. Sensation intact. Gait not checked.  PSYCHIATRIC: The patient is alert and oriented x 3.  SKIN: No obvious rash, lesion, or ulcer.   LABORATORY PANEL:   CBC Recent Labs  Lab 01/23/17 1144  WBC 9.9  HGB 9.9*  HCT 30.2*  PLT 218   ------------------------------------------------------------------------------------------------------------------  Chemistries  Recent Labs  Lab 01/23/17 1144  NA 132*  K 4.3  CL 100*  CO2 20*  GLUCOSE 182*  BUN 52*  CREATININE 4.92*  CALCIUM 8.5*  AST 119*  ALT 30  ALKPHOS 68  BILITOT 0.4   ------------------------------------------------------------------------------------------------------------------  Cardiac Enzymes Recent Labs  Lab 01/23/17 1144  TROPONINI >65.00*   ------------------------------------------------------------------------------------------------------------------  RADIOLOGY:  Dg Chest Port 1 View  Result Date: 01/23/2017 CLINICAL DATA:  Cough, fever EXAM: PORTABLE CHEST 1 VIEW COMPARISON:  12/31/2016 FINDINGS: Right dialysis catheter in place with the tip at the cavoatrial junction. Cardiomegaly with diffuse interstitial opacities likely edema.  No visible effusions or acute bony abnormality. IMPRESSION: Cardiomegaly. Diffuse interstitial opacities in the mid and lower lungs, likely edema/ CHF. Electronically Signed   By: Rolm Baptise M.D.   On: 01/23/2017 12:27    EKG:   Orders placed or performed during the hospital encounter of 01/23/17  . ED EKG 12-Lead  . ED EKG 12-Lead    IMPRESSION AND PLAN:   Quavion Boule  is a 56 y.o. male with a known history of COPD, chronic diastolic congestive heart failure,  end-stage renal disease recently started on hemodialysis was just admitted to hospital on 12/29/2016 and discharged on 01/06/2017 with CHF exacerbation is presenting to the ED with a chief complaint of generalized weakness. By the time he came into the hospital he is hypotensive. Patient's initial troponin is at 65 EKG with no ST elevations.  #Non-STEMI Admitted to intensive care unit Heparin drip without bolus Monitor patient on telemetry closely Cycle cardiac biomarkers Will get echocardiogram Cardiac consult is placed and discussed with Dr. Ubaldo Glassing, who is not considering cardiac catheter at this time Aspirin 81 mg enteric-coated and heparin drip and hold beta blocker in view of low blood pressure Check fasting lipid panel Discussed with ICU attending Dr. Humphrey Rolls  #Sepsis Meets criteria at the time of admission with fever and hypotension Will get blood cultures, urine culture and sensitivity Broad-spectrum IV antibiotic Zosyn and vancomycin Infectious disease consult.  Recently placed permacath doesn't seem to be infected Reporting diarrhea will check stool for C. difficile toxin and enteric precautions in the interim   #Chronic diastolic congestive heart failure with fluid overload In view of hypotension nephrology is not considering hemodialysis at this time  #Chronic history of obstructive sleep apnea Patient is noncompliant with CPAP machine Continue oxygen via nasal cannula  DVT prophylaxis on heparin drip GI prophylaxis with Protonix   All the records are reviewed and case discussed with ED provider. Management plans discussed with the patient, family and they are in agreement.  CODE STATUS:fc, wife is the healthcare power of attorney  TOTAL CRITICAL CARE TIME TAKING CARE OF THIS PATIENT: 18minutes.   Note: This dictation was prepared with Dragon dictation along with smaller phrase technology. Any transcriptional errors that result from this process are  unintentional.  Nicholes Mango M.D on 01/23/2017 at 1:28 PM  Between 7am to 6pm - Pager - [phone]  After 6pm go to [email or web address] - password [email or web address] [facility]  [facility]  Office  [phone]  CC: Primary care physician; Lorelee Market, MD

## 2017-01-23 NOTE — ED Triage Notes (Addendum)
Pt came to ED via EMS from home for increased weakness. Pt reports pain in lower back but is chronic pain. Also took a fall this morning. Pt recently started dialysis, last treatment Thursday, missed Saturday. BP 93/71 on arrival, hr 86, o2 93% r/a. Pt alert and oriented at this time

## 2017-01-23 NOTE — ED Notes (Signed)
Pt denies chest pain. Only complaint was back pain, reports better now with IV pain medication and repositioning. Wife at bedside along with nephrologist and admitting MD

## 2017-01-23 NOTE — Consult Note (Signed)
ANTICOAGULATION CONSULT NOTE - Initial Consult  Pharmacy Consult for heparin drip Indication: chest pain/ACS  Allergies  Allergen Reactions  . Naproxen Rash    Patient Measurements: Height: 5\' 10"  (177.8 cm) Weight: (!) 329 lb 9.4 oz (149.5 kg) IBW/kg (Calculated) : 73 Heparin Dosing Weight: 107.4kg  Vital Signs: Temp: 97.7 F (36.5 C) (12/02 2000) Temp Source: Axillary (12/02 2000) BP: 115/91 (12/02 2100) Pulse Rate: 69 (12/02 2100)  Labs: Recent Labs    01/23/17 1144 01/23/17 1509 01/23/17 2057  HGB 9.9*  --   --   HCT 30.2*  --   --   PLT 218  --   --   APTT 37*  --   --   LABPROT 15.9*  --   --   INR 1.28  --   --   HEPARINUNFRC  --   --  <0.10*  CREATININE 4.92*  --   --   TROPONINI >65.00* 62.91* 53.26*    Estimated Creatinine Clearance: 24.3 mL/min (A) (by C-G formula based on SCr of 4.92 mg/dL (H)).   Medical History: Past Medical History:  Diagnosis Date  . CHF (congestive heart failure) ([place])   . COPD (chronic obstructive pulmonary disease) ([place])   . Diabetes mellitus type 2 in obese ([place])   . DM (diabetes mellitus) type II controlled with renal manifestation ([place])   . Hypercholesteremia   . Hypertension   . Morbid obesity with BMI of 45.0-49.9, adult ([employer])   . Neuropathy   . Pancreatitis, acute   . Pneumonia   . Spinal stenosis     Medications:  Scheduled:  . [START ON 01/24/2017] aspirin EC  81 mg Oral Daily  . atorvastatin  80 mg Oral Daily  . gabapentin  100 mg Oral QHS  . heparin  3,300 Units Intravenous Once  . insulin aspart  0-5 Units Subcutaneous QHS  . insulin aspart  0-9 Units Subcutaneous TID WC  . [START ON 01/24/2017] levothyroxine  50 mcg Oral QAC breakfast  . sertraline  25 mg Oral Daily  . sodium chloride flush  10-40 mL Intracatheter Q12H    Assessment: Pt is a 56 year old obese male who was brought in by EMS with generalized weakness, hypotension. Pt found to have a troponin of >65. Pharmacy consulted to dose heparin  drip with no bolus- per Dr. Margaretmary Eddy.   Goal of Therapy:  Heparin level 0.3-0.7 units/ml Monitor platelets by anticoagulation protocol: Yes   Plan:  Start heparin infusion at 1300 units/hr Check anti-Xa level in 6 hours and daily while on heparin Continue to monitor H&H and platelets  01/23/17 2057 HL subtherapeutic. 3300 units IV x 1 bolus and increase rate to 1650 units/hr. Will recheck HL in 8 hours.  Mali Eppard A. [place], [place].D, BCPS Clinical Pharmacist 01/23/2017,9:55 PM

## 2017-01-23 NOTE — Progress Notes (Signed)
[facility]  ROUNDING NOTE   Subjective:   [name] admitted to [facility] ICU for hypotension, acute cardiorenal syndrome, and sepsis.   Missed dialysis yesterday due to diarrhea and nausea. Wife at bedside and assists with history taking.   Last dialysis was Thursday.   Objective:  Vital signs in last 24 hours:  Temp:  [97.6 F (36.4 C)-100.5 F (38.1 C)] 97.6 F (36.4 C) (12/02 1600) Pulse Rate:  [60-86] 60 (12/02 1800) Resp:  [13-22] 13 (12/02 1800) BP: (76-105)/(46-71) 98/58 (12/02 1800) SpO2:  [93 %-100 %] 100 % (12/02 1800) Weight:  [145.2 kg (320 lb)-149.5 kg (329 lb 9.4 oz)] 149.5 kg (329 lb 9.4 oz) (12/02 1434)  Weight change:  Filed Weights   01/23/17 1129 01/23/17 1434  Weight: (!) 145.2 kg (320 lb) (!) 149.5 kg (329 lb 9.4 oz)    Intake/Output: No intake/output data recorded.   Intake/Output this shift:  Total I/O In: 56.1 [I.V.:56.1] Out: -   Physical Exam: General: Critically ill  Head: Normocephalic, atraumatic. Moist oral mucosal membranes  Eyes: Anicteric, PERRL  Neck: Supple, trachea midline  Lungs:  Diminished bilaterally, mild crackles  Heart: Regular rate and rhythm  Abdomen:  Soft, nontender, obese  Extremities:  1+ peripheral edema.  Neurologic: Nonfocal, moving all four extremities  Skin: No lesions  Access: RIJ permcath    Basic Metabolic Panel: Recent Labs  Lab 01/23/17 1144  NA 132*  K 4.3  CL 100*  CO2 20*  GLUCOSE 182*  BUN 52*  CREATININE 4.92*  CALCIUM 8.5*    Liver Function Tests: Recent Labs  Lab 01/23/17 1144  AST 119*  ALT 30  ALKPHOS 68  BILITOT 0.4  PROT 7.3  ALBUMIN 3.0*   Recent Labs  Lab 01/23/17 1144  LIPASE 22   No results for input(s): AMMONIA in the last 168 hours.  CBC: Recent Labs  Lab 01/23/17 1144  WBC 9.9  NEUTROABS 8.5*  HGB 9.9*  HCT 30.2*  MCV 91.5  PLT 218    Cardiac Enzymes: Recent Labs  Lab 01/23/17 1144 01/23/17 1509  TROPONINI >65.00* 62.91*     BNP: Invalid input(s): POCBNP  CBG: Recent Labs  Lab 01/23/17 1707  GLUCAP 129*    Microbiology: Results for orders placed or performed during the hospital encounter of 01/23/17  C difficile quick scan w PCR reflex     Status: None   Collection Time: 01/23/17  1:24 PM  Result Value Ref Range Status   C Diff antigen NEGATIVE NEGATIVE Final   C Diff toxin NEGATIVE NEGATIVE Final   C Diff interpretation No C. difficile detected.  Final  MRSA PCR Screening     Status: None   Collection Time: 01/23/17  2:57 PM  Result Value Ref Range Status   MRSA by PCR NEGATIVE NEGATIVE Final    Comment:        The GeneXpert MRSA Assay (FDA approved for NASAL specimens only), is one component of a comprehensive MRSA colonization surveillance program. It is not intended to diagnose MRSA infection nor to guide or monitor treatment for MRSA infections.     Coagulation Studies: Recent Labs    01/23/17 1144  LABPROT 15.9*  INR 1.28    Urinalysis: No results for input(s): COLORURINE, LABSPEC, PHURINE, GLUCOSEU, HGBUR, BILIRUBINUR, KETONESUR, PROTEINUR, UROBILINOGEN, NITRITE, LEUKOCYTESUR in the last 72 hours.  Invalid input(s): APPERANCEUR    Imaging: Dg Chest 1 View  Result Date: 01/23/2017 CLINICAL DATA:  Central line placement.  EXAM: CHEST 1 VIEW COMPARISON:  01/23/2017 and prior radiographs FINDINGS: A left IJ central venous catheter is now noted with tip overlying upper SVC. A right IJ central venous catheter is again noted with tip overlying the mid SVC. Cardiomegaly and pulmonary vascular congestion again identified. Slight decrease in interstitial opacities/edema noted. There is no evidence of pneumothorax. IMPRESSION: Left IJ central venous catheter placement with tip overlying the upper SVC. No evidence of pneumothorax. Slight decrease in interstitial opacities/edema. Cardiomegaly and pulmonary vascular congestion. Electronically Signed   By: Margarette Canada M.D.   On:  01/23/2017 16:26   Dg Chest Port 1 View  Result Date: 01/23/2017 CLINICAL DATA:  Cough, fever EXAM: PORTABLE CHEST 1 VIEW COMPARISON:  12/31/2016 FINDINGS: Right dialysis catheter in place with the tip at the cavoatrial junction. Cardiomegaly with diffuse interstitial opacities likely edema. No visible effusions or acute bony abnormality. IMPRESSION: Cardiomegaly. Diffuse interstitial opacities in the mid and lower lungs, likely edema/ CHF. Electronically Signed   By: Rolm Baptise M.D.   On: 01/23/2017 12:27     Medications:   . sodium chloride 50 mL/hr at 01/23/17 1700  . heparin 1,300 Units/hr (01/23/17 1700)  . norepinephrine (LEVOPHED) Adult infusion 20 mcg/min (01/23/17 1810)  . piperacillin-tazobactam (ZOSYN)  IV 3.375 g (01/23/17 1715)  . [START ON 01/25/2017] vancomycin     . [START ON 01/24/2017] aspirin EC  81 mg Oral Daily  . atorvastatin  80 mg Oral Daily  . gabapentin  100 mg Oral QHS  . insulin aspart  0-5 Units Subcutaneous QHS  . insulin aspart  0-9 Units Subcutaneous TID WC  . [START ON 01/24/2017] levothyroxine  50 mcg Oral QAC breakfast  . sertraline  25 mg Oral Daily  . sodium chloride flush  10-40 mL Intracatheter Q12H   acetaminophen, albuterol, nitroGLYCERIN, ondansetron (ZOFRAN) IV, sodium chloride flush  Assessment/ Plan:  [name] is a 56 y.o. white male with end stage renal disease on hemodialysis, diabetes mellitus type II insulin dependent, diabetic peripheral neuropathy, right toe amputation, hypertension, hyperlipidemia, COPD/tobacco use, depression, coronary artery disease  TTS [facility] RIj permcath  1.  End-stage renal disease.  First dialysis November 9: Last dialysis was 11/29. Missed scheduled dialysis yesterday.  No acute indication for dialysis at this time. Plan on dialysis when patient is more hemodynamically stable. May need CRRT.   2. Hypotension: concern for sepsis versus cardiogenic shock. Vasopressors ordered.    3. Acute coronary syndrome: elevated cardiac enzymes.  - Appreciate cards input - heparin gtt.   4. Anemia of chronic kidney disease: hemoglobin 9.9 - hold epo due to ischemia  5. Secondary Hyperparathyroidism: currently not on binders.   6. Hypercapnic Respiratory Failure: placed on BiPap.    LOS: 0 Ranell Skibinski 12/2/20186:29 PM

## 2017-01-23 NOTE — Consult Note (Signed)
ANTICOAGULATION CONSULT NOTE - Initial Consult  Pharmacy Consult for heparin drip Indication: chest pain/ACS  Allergies  Allergen Reactions  . Naproxen Rash    Patient Measurements: Height: 5\' 10"  (177.8 cm) Weight: (!) 320 lb (145.2 kg) IBW/kg (Calculated) : 73 Heparin Dosing Weight: 107.4kg  Vital Signs: Temp: 100.5 F (38.1 C) (12/02 1129) Temp Source: Oral (12/02 1129) BP: 86/52 (12/02 1345) Pulse Rate: 72 (12/02 1345)  Labs: Recent Labs    01/23/17 1144  HGB 9.9*  HCT 30.2*  PLT 218  APTT 37*  LABPROT 15.9*  INR 1.28  CREATININE 4.92*  TROPONINI >65.00*    Estimated Creatinine Clearance: 23.9 mL/min (A) (by C-G formula based on SCr of 4.92 mg/dL (H)).   Medical History: Past Medical History:  Diagnosis Date  . CHF (congestive heart failure) ([place])   . COPD (chronic obstructive pulmonary disease) ([place])   . Diabetes mellitus type 2 in obese ([place])   . DM (diabetes mellitus) type II controlled with renal manifestation ([place])   . Hypercholesteremia   . Hypertension   . Morbid obesity with BMI of 45.0-49.9, adult ([place])   . Neuropathy   . Pancreatitis, acute   . Pneumonia   . Spinal stenosis     Medications:  Scheduled:  . [START ON 01/24/2017] aspirin EC  81 mg Oral Daily  . insulin aspart  0-5 Units Subcutaneous QHS  . insulin aspart  0-9 Units Subcutaneous TID WC    Assessment: Pt is a 56 year old obese male who was brought in by EMS with generalized weakness, hypotension. Pt found to have a troponin of >65. Pharmacy consulted to dose heparin drip with no bolus- per Dr. Margaretmary Eddy.   Goal of Therapy:  Heparin level 0.3-0.7 units/ml Monitor platelets by anticoagulation protocol: Yes   Plan:  Start heparin infusion at 1300 units/hr Check anti-Xa level in 6 hours and daily while on heparin Continue to monitor H&H and platelets  Marlea Gambill D Hisashi Amadon, Pharm.D, BCPS Clinical Pharmacist 01/23/2017,1:56 PM

## 2017-01-23 NOTE — Procedures (Signed)
Central Venous Catheter Insertion Procedure Note Brian [name] [id number] Jun 07, 1960  Late entry note: This procedure was performed earlier during the day today.  Procedure: Insertion of Central Venous Catheter Indications: Assessment of intravascular volume and drug administration  Procedure Details Consent: Risks of procedure as well as the alternatives and risks of each were explained to the patient.  Consent for procedure obtained. Time Out: Verified patient identification, verified procedure, site/side was marked, verified correct patient position, special equipment/implants available, medications/allergies/relevent history reviewed, required imaging and test results available.   Maximum sterile technique was used including cap, mask, sterile gown, sterile gloves, sterile drapes. Skin prep: Chlorhexidine; local anesthetic administered A antimicrobial bonded/coated triple lumen catheter was placed in the left internal jugular vein using the Seldinger technique.  Ultrasound was used to identify the left internal jugular vein.  Under direct ultrasound guidance, using Seldinger technique, the left internal jugular vein was accessed in the first attempt.  Dark red nonpulsatile blood flow was obtained.  Guidewire was passed into the left internal jugular vein without meeting any resistance.  The needle was removed and the pathway was dilated over the guidewire. A triple-lumen catheter was inserted over the guidewire into the left internal jugular vein and the guidewire was removed.  There were no ectopies on the monitor throughout the procedure. Dark red nonpulsatile blood flow was obtained in all 3 ports and the ports were flushed with normal saline without meeting any resistance. The triple-lumen catheter was sutured in place and sterile dressing was applied.  Evaluation Blood flow good Complications: No apparent complications Patient did tolerate procedure well. Chest X-ray ordered to  verify placement.  CXR showed the left IJ triple-lumen catheter to be appropriately placed.  There was no pneumothorax.  Brian [name] 01/23/2017, 9:49 PM

## 2017-01-24 ENCOUNTER — Encounter
Admission: EM | Disposition: A | Discharge status: Home Health | Payer: Self-pay | Source: Home / Self Care | Attending: Internal Medicine

## 2017-01-24 DIAGNOSIS — N186 End stage renal disease: Secondary | ICD-10-CM

## 2017-01-24 DIAGNOSIS — I10 Essential (primary) hypertension: Secondary | ICD-10-CM

## 2017-01-24 DIAGNOSIS — R509 Fever, unspecified: Secondary | ICD-10-CM

## 2017-01-24 DIAGNOSIS — T8579XA Infection and inflammatory reaction due to other internal prosthetic devices, implants and grafts, initial encounter: Secondary | ICD-10-CM

## 2017-01-24 HISTORY — PX: DIALYSIS/PERMA CATHETER REMOVAL: CATH118289

## 2017-01-24 LAB — LIPID PANEL
CHOL/HDL RATIO: 3.2 ratio
Cholesterol: 83 mg/dL (ref 0–200)
HDL: 26 mg/dL — AB (ref >40)
LDL CALC: 25 mg/dL (ref 0–99)
Triglycerides: 160 mg/dL — ABNORMAL HIGH (ref <150)
VLDL: 32 mg/dL (ref 0–40)

## 2017-01-24 LAB — GLUCOSE, CAPILLARY
GLUCOSE-CAPILLARY: 132 mg/dL — AB (ref 65–99)
GLUCOSE-CAPILLARY: 158 mg/dL — AB (ref 65–99)
GLUCOSE-CAPILLARY: 179 mg/dL — AB (ref 65–99)
Glucose-Capillary: 197 mg/dL — ABNORMAL HIGH (ref 65–99)
Glucose-Capillary: 156 mg/dL — ABNORMAL HIGH (ref 65–99)

## 2017-01-24 LAB — BLOOD CULTURE ID PANEL (REFLEXED)
Acinetobacter baumannii: NOT DETECTED
CANDIDA ALBICANS: NOT DETECTED
CANDIDA GLABRATA: NOT DETECTED
CANDIDA KRUSEI: NOT DETECTED
Candida parapsilosis: NOT DETECTED
Candida tropicalis: NOT DETECTED
ENTEROBACTER CLOACAE COMPLEX: NOT DETECTED
ENTEROBACTERIACEAE SPECIES: NOT DETECTED
ENTEROCOCCUS SPECIES: NOT DETECTED
ESCHERICHIA COLI: NOT DETECTED
Haemophilus influenzae: NOT DETECTED
Klebsiella oxytoca: NOT DETECTED
Klebsiella pneumoniae: NOT DETECTED
LISTERIA MONOCYTOGENES: NOT DETECTED
Methicillin resistance: NOT DETECTED
Neisseria meningitidis: NOT DETECTED
PROTEUS SPECIES: NOT DETECTED
Pseudomonas aeruginosa: NOT DETECTED
STAPHYLOCOCCUS SPECIES: DETECTED — AB
STREPTOCOCCUS AGALACTIAE: NOT DETECTED
STREPTOCOCCUS PNEUMONIAE: NOT DETECTED
STREPTOCOCCUS PYOGENES: NOT DETECTED
Serratia marcescens: NOT DETECTED
Staphylococcus aureus (BCID): DETECTED — AB
Streptococcus species: NOT DETECTED

## 2017-01-24 LAB — BASIC METABOLIC PANEL
ANION GAP: 11 (ref 5–15)
BUN: 58 mg/dL — ABNORMAL HIGH (ref 6–20)
CO2: 18 mmol/L — AB (ref 22–32)
Calcium: 8 mg/dL — ABNORMAL LOW (ref 8.9–10.3)
Chloride: 103 mmol/L (ref 101–111)
Creatinine, Ser: 5.43 mg/dL — ABNORMAL HIGH (ref 0.61–1.24)
GFR calc Af Amer: 12 mL/min — ABNORMAL LOW (ref >60)
GFR, EST NON AFRICAN AMERICAN: 11 mL/min — AB (ref >60)
GLUCOSE: 153 mg/dL — AB (ref 65–99)
POTASSIUM: 3.9 mmol/L (ref 3.5–5.1)
Sodium: 132 mmol/L — ABNORMAL LOW (ref 135–145)

## 2017-01-24 LAB — CBC
HEMATOCRIT: 27.4 % — AB (ref 40.0–52.0)
Hemoglobin: 9.1 g/dL — ABNORMAL LOW (ref 13.0–18.0)
MCH: 30.2 pg (ref 26.0–34.0)
MCHC: 33.2 g/dL (ref 32.0–36.0)
MCV: 91 fL (ref 80.0–100.0)
Platelets: 178 10*3/uL (ref 150–440)
RBC: 3.01 MIL/uL — AB (ref 4.40–5.90)
RDW: 15.2 % — ABNORMAL HIGH (ref 11.5–14.5)
WBC: 7.2 10*3/uL (ref 3.8–10.6)

## 2017-01-24 LAB — TROPONIN I
TROPONIN I: 52.89 ng/mL — AB (ref <0.03)
Troponin I: 36.73 ng/mL (ref <0.03)
Troponin I: 30.22 ng/mL (ref <0.03)

## 2017-01-24 LAB — HEPARIN LEVEL (UNFRACTIONATED): Heparin Unfractionated: 0.22 IU/mL — ABNORMAL LOW (ref 0.30–0.70)

## 2017-01-24 LAB — TSH: TSH: 1.784 u[IU]/mL (ref 0.350–4.500)

## 2017-01-24 SURGERY — DIALYSIS/PERMA CATHETER REMOVAL
Anesthesia: LOCAL

## 2017-01-24 MED ORDER — DEXTROSE 5 % IV SOLN
Freq: Two times a day (BID) | INTRAVENOUS | Status: DC
  Filled 2017-01-24 (×2): qty 20

## 2017-01-24 MED ORDER — CEFAZOLIN SODIUM-DEXTROSE 2-4 GM/100ML-% IV SOLN
2.0000 g | Freq: Two times a day (BID) | INTRAVENOUS | Status: DC
  Filled 2017-01-24 (×2): qty 100

## 2017-01-24 MED ORDER — DEXTROSE 5 % IV SOLN
Freq: Two times a day (BID) | INTRAVENOUS | Status: DC
  Administered 2017-01-24 – 2017-01-25 (×2): via INTRAVENOUS
  Filled 2017-01-24 (×3): qty 20

## 2017-01-24 MED ORDER — HEPARIN BOLUS VIA INFUSION
1600.0000 [IU] | Freq: Once | INTRAVENOUS | Status: AC
  Administered 2017-01-24: 1600 [IU] via INTRAVENOUS
  Filled 2017-01-24: qty 1600

## 2017-01-24 MED ORDER — LIDOCAINE-EPINEPHRINE (PF) 1 %-1:200000 IJ SOLN
INTRAMUSCULAR | Status: DC | PRN
  Administered 2017-01-24: 10 mL via INTRADERMAL

## 2017-01-24 MED ORDER — SODIUM CHLORIDE 0.9 % IV SOLN: INTRAVENOUS | Status: DC

## 2017-01-24 MED ORDER — DEXTROSE 5 % IV SOLN
Freq: Two times a day (BID) | INTRAVENOUS | Status: DC
  Filled 2017-01-24 (×2): qty 10

## 2017-01-24 SURGICAL SUPPLY — 2 items
FORCEPS HALSTEAD CVD 5IN STRL (INSTRUMENTS) ×2 IMPLANT
TRAY LACERAT/PLASTIC (MISCELLANEOUS) ×2 IMPLANT

## 2017-01-24 NOTE — Progress Notes (Signed)
[facility]  SUBJECTIVE: No chest pain.  Somewhat weak.   Vitals:   01/24/17 1130 01/24/17 1200 01/24/17 1230 01/24/17 1300  BP: 117/60 114/64 115/61 123/61  Pulse: 73 71 71 75  Resp: 18 17 16 20   Temp:      TempSrc:      SpO2: 99% 99% 99% 98%  Weight:      Height:        Intake/Output Summary (Last 24 hours) at 01/24/2017 1328 Last data filed at 01/24/2017 0600 Gross per 24 hour  Intake 967.68 ml  Output -  Net 967.68 ml    LABS: Basic Metabolic Panel: Recent Labs    01/23/17 1144 01/23/17 2057 01/24/17 0253  NA 132*  --  132*  K 4.3  --  3.9  CL 100*  --  103  CO2 20*  --  18*  GLUCOSE 182*  --  153*  BUN 52*  --  58*  CREATININE 4.92*  --  5.43*  CALCIUM 8.5*  --  8.0*  MG  --  1.7  --   PHOS  --  3.8  --    Liver Function Tests: Recent Labs    01/23/17 1144  AST 119*  ALT 30  ALKPHOS 68  BILITOT 0.4  PROT 7.3  ALBUMIN 3.0*   Recent Labs    01/23/17 1144  LIPASE 22   CBC: Recent Labs    01/23/17 1144 01/24/17 0253  WBC 9.9 7.2  NEUTROABS 8.5*  --   HGB 9.9* 9.1*  HCT 30.2* 27.4*  MCV 91.5 91.0  PLT 218 178   Cardiac Enzymes: Recent Labs    01/23/17 2057 01/24/17 0253 01/24/17 0921  TROPONINI 53.26* 52.89* 36.73*   BNP: Invalid input(s): POCBNP D-Dimer: No results for input(s): DDIMER in the last 72 hours. Hemoglobin A1C: No results for input(s): HGBA1C in the last 72 hours. Fasting Lipid Panel: Recent Labs    01/24/17 0253  CHOL 83  HDL 26*  LDLCALC 25  TRIG 160*  CHOLHDL 3.2   Thyroid Function Tests: Recent Labs    01/24/17 0253  TSH 1.784   Anemia Panel: No results for input(s): VITAMINB12, FOLATE, FERRITIN, TIBC, IRON, RETICCTPCT in the last 72 hours.   Physical Exam: Blood pressure 123/61, pulse 75, temperature 98.5 F (36.9 C), temperature source Oral, resp. rate 20, height 5\' 10"  (1.778 m), weight (!) 149.7 kg (330 lb 0.5 oz), SpO2 98 %.   Wt  Readings from Last 1 Encounters:  01/24/17 (!) 149.7 kg (330 lb 0.5 oz)     General appearance: alert and cooperative Resp: rhonchi bilaterally Cardio: Sinus tachycardia GI: soft, non-tender; bowel sounds normal; no masses,  no organomegaly Neurologic: Grossly normal  TELEMETRY: Reviewed telemetry pt in sinus tachycardia  ASSESSMENT AND PLAN:  Active Problems:   NSTEMI (non-ST elevated myocardial infarction) (HCC)-patient likely suffered a non-ST elevation myocardial infarction several days prior to presentation.  Etiology of this is unclear although coronary disease may be playing a role.  Patient also has staph aureus sepsis.  Will evaluate echo for LV function as well as valvular structures.  We will continue with antibiotics.  Would defer cardiac catheterization for now as the patient is septic.  Would consider this when stable and sepsis improves.  Further recognitions after echo is completed.  Continue with heparin, aspirin, atorvastatin  Sepsis-continue with vancomycin and Zosyn.  Levophed as needed for pressure support  Renal failure-followed by  nephrology.    [name], MD, [facility] 01/24/2017 1:28 PM

## 2017-01-24 NOTE — Op Note (Signed)
Operative Note     Preoperative diagnosis:   1. ESRD with bacteremia, likely permcath infection  Postoperative diagnosis:  1. Same as above  Procedure:  Removal of right jugular Permcath  Surgeon:  Leotis Pain, MD  Anesthesia:  Local  EBL:  Minimal  Indication for the Procedure:  The patient has been admitted with bacteremia and hypotension.  He has a PermCath in place which has clinical signs of possible infection and is likely the source of his bacteremia.  This needs to be removed.  Risks and benefits are discussed and informed consent is obtained.  Description of the Procedure:  The patient's right neck, chest and existing catheter were sterilely prepped and draped. The area around the catheter was anesthetized copiously with 1% lidocaine. The catheter was dissected out with curved hemostats until the cuff was freed from the surrounding fibrous sheath. The fiber sheath was transected, and the catheter was then removed in its entirety using gentle traction. Pressure was held and sterile dressings were placed. The patient tolerated the procedure well and was taken to the recovery room in stable condition.     Leotis Pain  01/24/2017, 4:35 PM This note was created with Dragon Medical transcription system. Any errors in dictation are purely unintentional.

## 2017-01-24 NOTE — Progress Notes (Signed)
[name] at [place] NAME: Brian [name]    MR#:  [id number]  DATE OF BIRTH:  06/06/1960  SUBJECTIVE:  CHIEF COMPLAINT:   Chief Complaint  Patient presents with  . Weakness   Generalized weakness. REVIEW OF SYSTEMS:  Review of Systems  Constitutional: Positive for malaise/fatigue. Negative for chills and fever.  HENT: Negative for sore throat.   Eyes: Negative for blurred vision and double vision.  Respiratory: Negative for cough, hemoptysis, shortness of breath, wheezing and stridor.   Cardiovascular: Negative for chest pain, palpitations, orthopnea and leg swelling.  Gastrointestinal: Negative for abdominal pain, blood in stool, diarrhea, melena, nausea and vomiting.  Genitourinary: Negative for dysuria, flank pain and hematuria.  Musculoskeletal: Negative for back pain and joint pain.  Neurological: Positive for weakness. Negative for dizziness, sensory change, focal weakness, seizures, loss of consciousness and headaches.  Endo/Heme/Allergies: Negative for polydipsia.  Psychiatric/Behavioral: Negative for depression. The patient is not nervous/anxious.     DRUG ALLERGIES:   Allergies  Allergen Reactions  . Naproxen Rash   VITALS:  Blood pressure 114/66, pulse 72, temperature 98.5 F (36.9 C), temperature source Oral, resp. rate 19, height 5\' 10"  (1.778 m), weight (!) 330 lb 0.5 oz (149.7 kg), SpO2 97 %. PHYSICAL EXAMINATION:  Physical Exam  Constitutional: He is oriented to person, place, and time and well-developed, well-nourished, and in no distress.  Morbid obese  HENT:  Head: Normocephalic.  Eyes: Conjunctivae and EOM are normal. Pupils are equal, round, and reactive to light. No scleral icterus.  Neck: Normal range of motion. Neck supple. No JVD present. No tracheal deviation present.  Cardiovascular: Normal rate, regular rhythm and normal heart sounds. Exam reveals no gallop.  No murmur heard. Pulmonary/Chest:  Effort normal and breath sounds normal. No respiratory distress. He has no wheezes. He has no rales.  Abdominal: Soft. Bowel sounds are normal. He exhibits no distension. There is no tenderness. There is no rebound.  Musculoskeletal: Normal range of motion. He exhibits no edema or tenderness.  Neurological: He is alert and oriented to person, place, and time. No cranial nerve deficit.  Skin: No rash noted. No erythema.  Psychiatric: Affect normal.   LABORATORY PANEL:  Male CBC Recent Labs  Lab 01/24/17 0253  WBC 7.2  HGB 9.1*  HCT 27.4*  PLT 178   ------------------------------------------------------------------------------------------------------------------ Chemistries  Recent Labs  Lab 01/23/17 1144 01/23/17 2057 01/24/17 0253  NA 132*  --  132*  K 4.3  --  3.9  CL 100*  --  103  CO2 20*  --  18*  GLUCOSE 182*  --  153*  BUN 52*  --  58*  CREATININE 4.92*  --  5.43*  CALCIUM 8.5*  --  8.0*  MG  --  1.7  --   AST 119*  --   --   ALT 30  --   --   ALKPHOS 68  --   --   BILITOT 0.4  --   --    RADIOLOGY:  Dg Chest 1 View  Result Date: 01/23/2017 CLINICAL DATA:  Central line placement. EXAM: CHEST 1 VIEW COMPARISON:  01/23/2017 and prior radiographs FINDINGS: A left IJ central venous catheter is now noted with tip overlying upper SVC. A right IJ central venous catheter is again noted with tip overlying the mid SVC. Cardiomegaly and pulmonary vascular congestion again identified. Slight decrease in interstitial opacities/edema noted. There is no evidence of pneumothorax. IMPRESSION: Left IJ  central venous catheter placement with tip overlying the upper SVC. No evidence of pneumothorax. Slight decrease in interstitial opacities/edema. Cardiomegaly and pulmonary vascular congestion. Electronically Signed   By: Margarette Canada M.D.   On: 01/23/2017 16:26   ASSESSMENT AND PLAN:   Brian [name]  is a 56 y.o. male with a known history of COPD, chronic diastolic congestive heart  failure, end-stage renal disease recently started on hemodialysis was just admitted to hospital on 12/29/2016 and discharged on 01/06/2017 with CHF exacerbation is presenting to the ED with a chief complaint of generalized weakness. By the time he came into the hospital he is hypotensive. Patient's initial troponin is at 65 EKG with no ST elevations.  #Non-STEMI Continue heparin drip, ASA and atorvastatin, f/u echo. Per Dr. Ubaldo Glassing, consider cath when septic picture improves to evaluate anatomy. Not candidate for beta blockers and ace I given hemodynamics.   #Septic shock with bacteremia, possible due to permacath infection. Continue Levophed drip and antibiotics.  Blood cultures show positive staph aureus. Broad-spectrum IV antibiotic Zosyn and vancomycin. Remove permcath. Infectious disease consult.   ESRD. Temporary dialysis cathter placement and dialysis likely tomorrow once temporary dialysis cathter is available per Dr. Candiss Norse.  Diarrhea. Negative C. difficile test.  #Chronic diastolic congestive heart failure with fluid overload In view of hypotension nephrology is not considering hemodialysis at this time  #Chronic history of obstructive sleep apnea Patient is noncompliant with CPAP machine Continue oxygen via nasal cannula  Anemia of chronic disease. EPO with HD  Discussed with ICU attending Dr. Humphrey Rolls All the records are reviewed and case discussed with Care Management/Social Worker. Management plans discussed with the patient, family and they are in agreement.  CODE STATUS: Full Code  TOTAL TIME TAKING CARE OF THIS PATIENT: 42 minutes.   More than 50% of the time was spent in counseling/coordination of care: YES  POSSIBLE D/C IN 3 DAYS, DEPENDING ON CLINICAL CONDITION.   Demetrios Loll M.D on 01/24/2017 at 2:38 PM  Between 7am to 6pm - Pager - [phone]  After 6pm go to [email or web address] - [facility]

## 2017-01-24 NOTE — Progress Notes (Signed)
[facility], [place] 01/24/17  Subjective:  Patient is doing fair Denies acute C/O Blood cultures are positive for staph aureus + LE edema Potassium 3.9  Objective:  Vital signs in last 24 hours:  Temp:  [97.5 F (36.4 C)-100.5 F (38.1 C)] 97.5 F (36.4 C) (12/03 0800) Pulse Rate:  [55-86] 73 (12/03 0930) Resp:  [13-23] 21 (12/03 0930) BP: (76-129)/(46-91) 109/62 (12/03 0930) SpO2:  [88 %-100 %] 99 % (12/03 0930) Weight:  [145.2 kg (320 lb)-149.7 kg (330 lb 0.5 oz)] 149.7 kg (330 lb 0.5 oz) (12/03 0500)  Weight change:  Filed Weights   01/23/17 1129 01/23/17 1434 01/24/17 0500  Weight: (!) 145.2 kg (320 lb) (!) 149.5 kg (329 lb 9.4 oz) (!) 149.7 kg (330 lb 0.5 oz)    Intake/Output:    Intake/Output Summary (Last 24 hours) at 01/24/2017 1009 Last data filed at 01/24/2017 0600 Gross per 24 hour  Intake 967.68 ml  Output -  Net 967.68 ml     Physical Exam: General: Laying in bed  HEENT Anicteric, moist mucus membranes  Neck Supple, left IJ CVL  Pulm/lungs Clear b/l  CVS/Heart regular  Abdomen:  Soft, NT, ND, BS present  Extremities: + dependent edema  Neurologic: Alert, oteinted     Access: Rt IJ PC, blood around exit site       Basic Metabolic Panel:  Recent Labs  Lab 01/23/17 1144 01/23/17 2057 01/24/17 0253  NA 132*  --  132*  K 4.3  --  3.9  CL 100*  --  103  CO2 20*  --  18*  GLUCOSE 182*  --  153*  BUN 52*  --  58*  CREATININE 4.92*  --  5.43*  CALCIUM 8.5*  --  8.0*  MG  --  1.7  --   PHOS  --  3.8  --      CBC: Recent Labs  Lab 01/23/17 1144 01/24/17 0253  WBC 9.9 7.2  NEUTROABS 8.5*  --   HGB 9.9* 9.1*  HCT 30.2* 27.4*  MCV 91.5 91.0  PLT 218 178      Lab Results  Component Value Date   HEPBSAG Negative 12/31/2016   HEPBSAB Non Reactive 12/31/2016      Microbiology:  Recent Results (from the past 240 hour(s))  CULTURE, BLOOD (ROUTINE X 2) w Reflex to ID Panel     Status: None  (Preliminary result)   Collection Time: 01/23/17 11:44 AM  Result Value Ref Range Status   Specimen Description BLOOD RIGHT ANTECUBITAL  Final   Special Requests   Final    BOTTLES DRAWN AEROBIC AND ANAEROBIC Blood Culture results may not be optimal due to an excessive volume of blood received in culture bottles   Culture  Setup Time   Final    GRAM POSITIVE COCCI IN BOTH AEROBIC AND ANAEROBIC BOTTLES CRITICAL RESULT CALLED TO, READ BACK BY AND VERIFIED WITH: MATT MCBANE AT [place] ON 01/24/17 [place].    Culture GRAM POSITIVE COCCI  Final   Report Status PENDING  Incomplete  CULTURE, BLOOD (ROUTINE X 2) w Reflex to ID Panel     Status: None (Preliminary result)   Collection Time: 01/23/17 11:44 AM  Result Value Ref Range Status   Specimen Description BLOOD BLOOD LEFT FOREARM  Final   Special Requests   Final    BOTTLES DRAWN AEROBIC AND ANAEROBIC Blood Culture results may not be optimal due to an excessive volume of blood received in  culture bottles   Culture  Setup Time   Final    GRAM POSITIVE COCCI IN BOTH AEROBIC AND ANAEROBIC BOTTLES CRITICAL VALUE NOTED.  VALUE IS CONSISTENT WITH PREVIOUSLY REPORTED AND CALLED VALUE.    Culture GRAM POSITIVE COCCI  Final   Report Status PENDING  Incomplete  Blood Culture ID Panel (Reflexed)     Status: Abnormal   Collection Time: 01/23/17 11:44 AM  Result Value Ref Range Status   Enterococcus species NOT DETECTED NOT DETECTED Final   Listeria monocytogenes NOT DETECTED NOT DETECTED Final   Staphylococcus species DETECTED (A) NOT DETECTED Final    Comment: CRITICAL RESULT CALLED TO, READ BACK BY AND VERIFIED WITH: MATT MCBANE AT 0700 ON 01/24/17 [place].    Staphylococcus aureus DETECTED (A) NOT DETECTED Final    Comment: Methicillin (oxacillin) susceptible Staphylococcus aureus (MSSA). Preferred therapy is anti staphylococcal beta lactam antibiotic (Cefazolin or Nafcillin), unless clinically contraindicated. CRITICAL RESULT CALLED TO, READ BACK BY  AND VERIFIED WITH: MATT MCBANE AT 0700 ON 01/24/17 [place].    Methicillin resistance NOT DETECTED NOT DETECTED Final   Streptococcus species NOT DETECTED NOT DETECTED Final   Streptococcus agalactiae NOT DETECTED NOT DETECTED Final   Streptococcus pneumoniae NOT DETECTED NOT DETECTED Final   Streptococcus pyogenes NOT DETECTED NOT DETECTED Final   Acinetobacter baumannii NOT DETECTED NOT DETECTED Final   Enterobacteriaceae species NOT DETECTED NOT DETECTED Final   Enterobacter cloacae complex NOT DETECTED NOT DETECTED Final   Escherichia coli NOT DETECTED NOT DETECTED Final   Klebsiella oxytoca NOT DETECTED NOT DETECTED Final   Klebsiella pneumoniae NOT DETECTED NOT DETECTED Final   Proteus species NOT DETECTED NOT DETECTED Final   Serratia marcescens NOT DETECTED NOT DETECTED Final   Haemophilus influenzae NOT DETECTED NOT DETECTED Final   Neisseria meningitidis NOT DETECTED NOT DETECTED Final   Pseudomonas aeruginosa NOT DETECTED NOT DETECTED Final   Candida albicans NOT DETECTED NOT DETECTED Final   Candida glabrata NOT DETECTED NOT DETECTED Final   Candida krusei NOT DETECTED NOT DETECTED Final   Candida parapsilosis NOT DETECTED NOT DETECTED Final   Candida tropicalis NOT DETECTED NOT DETECTED Final  C difficile quick scan w PCR reflex     Status: None   Collection Time: 01/23/17  1:24 PM  Result Value Ref Range Status   C Diff antigen NEGATIVE NEGATIVE Final   C Diff toxin NEGATIVE NEGATIVE Final   C Diff interpretation No C. difficile detected.  Final  MRSA PCR Screening     Status: None   Collection Time: 01/23/17  2:57 PM  Result Value Ref Range Status   MRSA by PCR NEGATIVE NEGATIVE Final    Comment:        The GeneXpert MRSA Assay (FDA approved for NASAL specimens only), is one component of a comprehensive MRSA colonization surveillance program. It is not intended to diagnose MRSA infection nor to guide or monitor treatment for MRSA infections.     Coagulation  Studies: Recent Labs    01/23/17 1144  LABPROT 15.9*  INR 1.28    Urinalysis: No results for input(s): COLORURINE, LABSPEC, PHURINE, GLUCOSEU, HGBUR, BILIRUBINUR, KETONESUR, PROTEINUR, UROBILINOGEN, NITRITE, LEUKOCYTESUR in the last 72 hours.  Invalid input(s): APPERANCEUR    Imaging: Dg Chest 1 View  Result Date: 01/23/2017 CLINICAL DATA:  Central line placement. EXAM: CHEST 1 VIEW COMPARISON:  01/23/2017 and prior radiographs FINDINGS: A left IJ central venous catheter is now noted with tip overlying upper SVC. A right IJ central venous catheter  is again noted with tip overlying the mid SVC. Cardiomegaly and pulmonary vascular congestion again identified. Slight decrease in interstitial opacities/edema noted. There is no evidence of pneumothorax. IMPRESSION: Left IJ central venous catheter placement with tip overlying the upper SVC. No evidence of pneumothorax. Slight decrease in interstitial opacities/edema. Cardiomegaly and pulmonary vascular congestion. Electronically Signed   By: Margarette Canada M.D.   On: 01/23/2017 16:26   Dg Chest Port 1 View  Result Date: 01/23/2017 CLINICAL DATA:  Cough, fever EXAM: PORTABLE CHEST 1 VIEW COMPARISON:  12/31/2016 FINDINGS: Right dialysis catheter in place with the tip at the cavoatrial junction. Cardiomegaly with diffuse interstitial opacities likely edema. No visible effusions or acute bony abnormality. IMPRESSION: Cardiomegaly. Diffuse interstitial opacities in the mid and lower lungs, likely edema/ CHF. Electronically Signed   By: Rolm Baptise M.D.   On: 01/23/2017 12:27     Medications:   . heparin 1,650 Units/hr (01/24/17 0410)  . norepinephrine (LEVOPHED) Adult infusion 10 mcg/min (01/24/17 0410)  . piperacillin-tazobactam (ZOSYN)  IV Stopped (01/24/17 0942)  . [START ON 01/25/2017] vancomycin     . aspirin EC  81 mg Oral Daily  . atorvastatin  80 mg Oral Daily  . gabapentin  100 mg Oral QHS  . insulin aspart  0-5 Units Subcutaneous QHS   . insulin aspart  0-9 Units Subcutaneous TID WC  . levothyroxine  50 mcg Oral QAC breakfast  . sertraline  25 mg Oral Daily  . sodium chloride flush  10-40 mL Intracatheter Q12H   acetaminophen, albuterol, nitroGLYCERIN, ondansetron (ZOFRAN) IV, sodium chloride flush  Assessment/ Plan:  56 y.o. caucasian male with end stage renal disease on hemodialysis, diabetes mellitus type II insulin dependent, diabetic peripheral neuropathy, right toe amputation, hypertension, hyperlipidemia, COPD/tobacco use, depression, coronary artery disease  TTS [facility] RIJ permcath  1. ESRD 2, Sepsis, Staph aureus 3. Acute coronary syndrome/ NSTEMI/ Seen by Dr Ubaldo Glassing 4. Edema 5. AoCKD  Plan: Consult vascular surgery to remove permcath Temporary dialysis cathter placement Antibiotics as per ICU team; currently getting [facility] Dialysis likely tomorrow once temporary dialysis cathter is available. EPO with HD On Heparin drip for ACS, [facility] cardiology following   LOS: 1 [facility] 12/3/201810:09 [place], [place]

## 2017-01-24 NOTE — Consult Note (Signed)
[facility] V[facility] Consult Note  MRN : [id number]  Brian [name] is a 56 y.o. (Jun 25, 1960) male who presents with chief complaint of  Chief Complaint  Patient presents with  . Weakness  .  History of Present Illness: I am asked to see the patient by Dr. Candiss Norse for a permcath infection.  The patient has been having falls, weakness, and diarrhea.  He was found to have a fever and hypotension and was admitted to the critical care unit for sepsis.  He was found to have staph aureus bacteremia.  He had a PermCath placed a little less than 1 month ago and has been getting dialysis through this PermCath.  He has some mild pain and tenderness at the PermCath site in the right jugular location.  No obvious drainage.  No trauma or injury.  Current Facility-Administered Medications  Medication Dose Route Frequency Provider Last Rate Last Dose  . 0.9 %  sodium chloride infusion   Intravenous Continuous Algernon Huxley, MD      . acetaminophen (TYLENOL) tablet 650 mg  650 mg Oral Q4H PRN Nicholes Mango, MD   650 mg at 01/24/17 0523  . albuterol (PROVENTIL) (2.5 MG/3ML) 0.083% nebulizer solution 3 mL  3 mL Inhalation Q4H PRN Gouru, Aruna, MD      . aspirin EC tablet 81 mg  81 mg Oral Daily Gouru, Aruna, MD   81 mg at 01/24/17 0940  . atorvastatin (LIPITOR) tablet 80 mg  80 mg Oral Daily Gouru, Aruna, MD   80 mg at 01/24/17 0939  . ceFAZolin (ANCEF) 2 g in dextrose 5 % 100 mL injection   Intravenous Q12H Demetrios Loll, MD      . gabapentin (NEURONTIN) capsule 100 mg  100 mg Oral QHS Gouru, Aruna, MD      . heparin ADULT infusion 100 units/mL (25000 units/250mL sodium chloride 0.45%)  1,850 Units/hr Intravenous Continuous Demetrios Loll, MD 18.5 mL/hr at 01/24/17 1520 1,850 Units/hr at 01/24/17 1520  . insulin aspart (novoLOG) injection 0-5 Units  0-5 Units Subcutaneous QHS Gouru, Aruna, MD      . insulin aspart (novoLOG) injection 0-9 Units  0-9 Units Subcutaneous TID WC Gouru, Aruna, MD   2  Units at 01/24/17 1125  . levothyroxine (SYNTHROID, LEVOTHROID) tablet 50 mcg  50 mcg Oral QAC breakfast Gouru, Aruna, MD   50 mcg at 01/24/17 0724  . nitroGLYCERIN (NITROSTAT) SL tablet 0.4 mg  0.4 mg Sublingual Q5 Min x 3 PRN Gouru, Aruna, MD      . norepinephrine (LEVOPHED) 4 mg in dextrose 5 % 250 mL (0.016 mg/mL) infusion  0-40 mcg/min Intravenous Titrated [name], MD 37.5 mL/hr at 01/24/17 1500 10 mcg/min at 01/24/17 1500  . ondansetron (ZOFRAN) injection 4 mg  4 mg Intravenous Q6H PRN Gouru, Aruna, MD      . sertraline (ZOLOFT) tablet 25 mg  25 mg Oral Daily Gouru, Aruna, MD   25 mg at 01/24/17 0939  . sodium chloride flush (NS) 0.9 % injection 10-40 mL  10-40 mL Intracatheter Q12H [name], MD   20 mL at 01/24/17 0950  . sodium chloride flush (NS) 0.9 % injection 10-40 mL  10-40 mL Intracatheter PRN [name], MD        Past Medical History:  Diagnosis Date  . CHF (congestive heart failure) ([place])   . COPD (chronic obstructive pulmonary disease) ([place])   . Diabetes mellitus type 2 in obese ([place])   . DM (diabetes  mellitus) type II controlled with renal manifestation ([place])   . Hypercholesteremia   . Hypertension   . Morbid obesity with BMI of 45.0-49.9, adult ([place])   . Neuropathy   . Pancreatitis, acute   . Pneumonia   . Spinal stenosis     Past Surgical History:  Procedure Laterality Date  . AMPUTATION    . CHOLECYSTECTOMY  1998  . DIALYSIS/PERMA CATHETER INSERTION N/A 01/03/2017   Procedure: DIALYSIS/PERMA CATHETER INSERTION;  Surgeon: Algernon Huxley, MD;  Location: [facility];  Service: Cardiovascular;  Laterality: N/A;  . LAPAROSCOPIC APPENDECTOMY N/A 01/06/2015   Procedure: APPENDECTOMY LAPAROSCOPIC drainage of peritoneal abcess;  Surgeon: Sherri Rad, MD;  Location: [facility] OR[facility];  Service: General;  Laterality: N/A;    Social History Social History   Tobacco Use  . Smoking status: Current Every Day Smoker    Packs/day: 1.00    Types: Cigarettes  .  Smokeless tobacco: Never Used  Substance Use Topics  . Alcohol use: No  . Drug use: No    Family History Family History  Problem Relation Age of Onset  . Hypertension Mother   . Hyperlipidemia Mother   . Heart disease Father   . Heart disease Maternal Grandfather     Allergies  Allergen Reactions  . Naproxen Rash     REVIEW OF SYSTEMS (Negative unless checked)  Constitutional: [] Weight loss  [x] Fever  [x] Chills Cardiac: [] Chest pain   [] Chest pressure   [x] Palpitations   [] Shortness of breath when laying flat   [] Shortness of breath at rest   [x] Shortness of breath with exertion. Vascular:  [] Pain in legs with walking   [] Pain in legs at rest   [] Pain in legs when laying flat   [] Claudication   [] Pain in feet when walking  [] Pain in feet at rest  [] Pain in feet when laying flat   [] History of DVT   [] Phlebitis   [x] Swelling in legs   [] Varicose veins   [] Non-healing ulcers Pulmonary:   [] Uses home oxygen   [] Productive cough   [] Hemoptysis   [] Wheeze  [] COPD   [] Asthma Neurologic:  [] Dizziness  [] Blackouts   [] Seizures   [] History of stroke   [] History of TIA  [] Aphasia   [] Temporary blindness   [] Dysphagia   [] Weakness or numbness in arms   [] Weakness or numbness in legs Musculoskeletal:  [x] Arthritis   [] Joint swelling   [] Joint pain   [x] Low back pain Hematologic:  [] Easy bruising  [] Easy bleeding   [] Hypercoagulable state   [x] Anemic  [] Hepatitis Gastrointestinal:  [] Blood in stool   [] Vomiting blood  [] Gastroesophageal reflux/heartburn   [] Difficulty swallowing. Genitourinary:  [x] Chronic kidney disease   [] Difficult urination  [] Frequent urination  [] Burning with urination   [] Blood in urine Skin:  [] Rashes   [] Ulcers   [] Wounds Psychological:  [] History of anxiety   []  History of major depression.  Physical Examination  Vitals:   01/24/17 1430 01/24/17 1506 01/24/17 1530 01/24/17 1600  BP: 110/64 125/62 123/63 130/60  Pulse: 74 80 80 80  Resp: 20 15 (!) 22 20  Temp:    98.7 F (37.1 C)   TempSrc:   Oral   SpO2: 93% 98% 99% 96%  Weight:      Height:       Body mass index is 47.35 kg/m. Gen:  WD/WN, NAD Head: [place]/AT, No temporalis wasting.  Ear/Nose/Throat: Hearing grossly intact, nares w/o erythema or drainage, oropharynx w/o Erythema/Exudate Eyes: Sclera non-icteric, conjunctiva clear Neck: Trachea midline.  No  JVD.  Pulmonary:  Good air movement, respirations not labored, equal bilaterally.  Cardiac: RRR, no JVD Vascular: mild redness and drainage around the exit site of the right jugular permcath Vessel Right Left  Radial Palpable Palpable                                    Musculoskeletal: M/S 5/5 throughout.  Extremities without ischemic changes.  No deformity or atrophy.  Neurologic: Sensation grossly intact in extremities.  Symmetrical.  Speech is fluent. Motor exam as listed above. Psychiatric: Judgment intact, Mood & affect appropriate for pt's clinical situation. Dermatologic: No rashes or ulcers noted.  permcath site as above       CBC Lab Results  Component Value Date   WBC 7.2 01/24/2017   HGB 9.1 (L) 01/24/2017   HCT 27.4 (L) 01/24/2017   MCV 91.0 01/24/2017   PLT 178 01/24/2017    BMET    Component Value Date/Time   NA 132 (L) 01/24/2017 0253   NA 136 11/05/2012 0309   K 3.9 01/24/2017 0253   K 3.7 11/05/2012 0309   CL 103 01/24/2017 0253   CL 105 11/05/2012 0309   CO2 18 (L) 01/24/2017 0253   CO2 27 11/05/2012 0309   GLUCOSE 153 (H) 01/24/2017 0253   GLUCOSE 132 (H) 11/05/2012 0309   BUN 58 (H) 01/24/2017 0253   BUN 21 (H) 11/05/2012 0309   CREATININE 5.43 (H) 01/24/2017 0253   CREATININE 0.97 11/05/2012 0309   CALCIUM 8.0 (L) 01/24/2017 0253   CALCIUM 8.3 (L) 11/05/2012 0309   GFRNONAA 11 (L) 01/24/2017 0253   GFRNONAA >60 11/05/2012 0309   GFRAA 12 (L) 01/24/2017 0253   GFRAA >60 11/05/2012 0309   Estimated Creatinine Clearance: 22 mL/min (A) (by C-G formula based on SCr of 5.43 mg/dL  (H)).  COAG Lab Results  Component Value Date   INR 1.28 01/23/2017   INR 1.12 01/02/2017   INR 1.09 12/29/2016    Radiology Dg Chest 1 View  Result Date: 01/23/2017 CLINICAL DATA:  Central line placement. EXAM: CHEST 1 VIEW COMPARISON:  01/23/2017 and prior radiographs FINDINGS: A left IJ central venous catheter is now noted with tip overlying upper SVC. A right IJ central venous catheter is again noted with tip overlying the mid SVC. Cardiomegaly and pulmonary vascular congestion again identified. Slight decrease in interstitial opacities/edema noted. There is no evidence of pneumothorax. IMPRESSION: Left IJ central venous catheter placement with tip overlying the upper SVC. No evidence of pneumothorax. Slight decrease in interstitial opacities/edema. Cardiomegaly and pulmonary vascular congestion. Electronically Signed   By: Margarette Canada M.D.   On: 01/23/2017 16:26   [place] Ue Vein Mapping Left  Result Date: 01/05/2017 CLINICAL DATA:  End-stage renal disease EXAM: [place] EXTREM UP VEIN MAPPING COMPARISON:  None. FINDINGS: LEFT ARTERIES Wrist Radial Artery: Size 1.15mm  Waveform triphasic Wrist Ulnar Artery: Size 1.42mm  Waveform triphasic Prox. Forearm Radial Artery: Size 1.37mm  Waveform triphasic Upper Arm Brachial Artery: Size 5.67mm  Waveform triphasic LEFT VEINS Forearm Cephalic Vein: Prox 7.8IO Distal 2.83mm Depth 9.6-2.9BM Forearm Basilic Vein: Prox diminutive Distal diminutive Upper Arm Cephalic Vein: Prox 8.4XL Distal 4.32mm Depth 13.7 -4.69mm There is a segment of wall thickening without occlusion All Upper Arm Basilic Vein: Prox 72mm Distal 1.27mm Depth 15.3 -6.37mm Upper Arm Brachial Vein: Prox 6.14mm Distal 3.4mm Depth 0.2 0.1 -15.5mm ADDITIONAL LEFT VEINS Axillary Vein:  9.42mm  Subclavian Vein: Patient: Yes Respiratory Phasicity: Present Internal Jugular Vein: Patent: Yes    Respiratory Phasicity: Present Branches > 2 mm: At least 1 from the cephalic vein in the upper arm, 3 mm At least 1 from the  basilic vein, upper arm, 1.4 mm At least 4 from the brachial vein in the upper arm, 2.3-4.5 mm Note made of patent right subclavian and IJ veins with respiratory phasicity. IMPRESSION: Patent left upper extremity vasculature as detailed above. Electronically Signed   By: Lucrezia Europe M.D.   On: 01/05/2017 09:03   Dg Chest Port 1 View  Result Date: 01/23/2017 CLINICAL DATA:  Cough, fever EXAM: PORTABLE CHEST 1 VIEW COMPARISON:  12/31/2016 FINDINGS: Right dialysis catheter in place with the tip at the cavoatrial junction. Cardiomegaly with diffuse interstitial opacities likely edema. No visible effusions or acute bony abnormality. IMPRESSION: Cardiomegaly. Diffuse interstitial opacities in the mid and lower lungs, likely edema/ CHF. Electronically Signed   By: Rolm Baptise M.D.   On: 01/23/2017 12:27   Dg Chest Port 1 View  Result Date: 12/31/2016 CLINICAL DATA:  Central line placement EXAM: PORTABLE CHEST 1 VIEW COMPARISON:  12/29/2016 FINDINGS: Right jugular central venous catheter tip in the proximal SVC. No pneumothorax Progression of pulmonary vascular congestion and mild edema. No effusion. IMPRESSION: Central line placement in the proximal SVC.  No pneumothorax Progression of heart failure with mild edema. Electronically Signed   By: Franchot Gallo M.D.   On: 12/31/2016 15:01   Dg Chest Portable 1 View  Result Date: 12/29/2016 CLINICAL DATA:  Initial evaluation for acute shortness of breath. History of CHF, COPD, hypertension. EXAM: PORTABLE CHEST 1 VIEW COMPARISON:  Prior radiograph from 02/26/2016. FINDINGS: Moderate cardiomegaly, stable. Apparent widening of the mediastinum favored to be related to AP technique, shallow lung inflation, and lordotic angulation. Lungs normally inflated. Diffuse pulmonary vascular and interstitial congestion without frank pulmonary edema. Underlying COPD. No focal infiltrates. No appreciable pleural effusion. Mild right basilar subsegmental atelectasis. No  pneumothorax. No acute osseus abnormality. IMPRESSION: 1. Cardiomegaly with mild diffuse pulmonary interstitial congestion without frank pulmonary edema. 2. Mild right basilar atelectasis. Electronically Signed   By: Jeannine Boga M.D.   On: 12/29/2016 17:14   Dg Hip Unilat W Or Wo Pelvis 2-3 Views Left  Result Date: 12/29/2016 CLINICAL DATA:  Initial evaluation for multiple recent falls, pain. EXAM: DG HIP (WITH OR WITHOUT PELVIS) 2-3V LEFT COMPARISON:  Prior radiograph from 07/12/2016. FINDINGS: Left hip in slight internal rotation. No acute fracture dislocation. Femoral head in normal alignment with the acetabulum. Heterotopic calcification adjacent to the greater trochanter is stable. Bony pelvis intact. SI joints approximated. Mild degenerative osteoarthrosis. Degenerative changes noted within lower lumbar spine. No acute soft tissue abnormality.  Atherosclerosis. IMPRESSION: 1. No acute osseous abnormality about the left hip. 2. Mild degenerative osteoarthrosis. Electronically Signed   By: Jeannine Boga M.D.   On: 12/29/2016 17:12   Dg Hip Unilat  With Pelvis 2-3 Views Right  Result Date: 12/29/2016 CLINICAL DATA:  Initial evaluation for multiple recent falls, acute right hip pain. EXAM: DG HIP (WITH OR WITHOUT PELVIS) 2-3V RIGHT COMPARISON:  None. FINDINGS: No acute fracture or dislocation. Femoral head in normal alignment with the acetabulum. Femoral head height preserved. Bony pelvis intact. SI joints approximated. Mild degenerative osteoarthrosis about the hips bilaterally. Degenerative changes noted within lower lumbar spine. No acute soft tissue abnormality.  Atherosclerosis noted. IMPRESSION: 1. No acute osseous abnormality about the right hip. 2. Mild degenerative osteoarthrosis. Electronically  Signed   By: Jeannine Boga M.D.   On: 12/29/2016 17:10      Assessment/Plan 1.  ESRD with PermCath placed approximately 1 month ago.  Now with fevers and bacteremia.  PermCath  will need to be removed.  Risks and benefits discussed with the patient.  He can have a temporary dialysis catheter placed by the critical care service tomorrow and have dialysis through this until he is infection free.  Once he is 48 hours without fever and with negative blood cultures, a new PermCath can be placed or he can potentially start using his PD catheter. 2.  Bacteremia.  Likely from PermCath infection.  See above. 3.  DM. Stable on outpatient medications and blood glucose control important in reducing the progression of atherosclerotic disease. Also, involved in wound healing. On appropriate medications. 4.  HTN.  Chronic as an outpatient.  Actually has hypotension on this admission.  Improved with treatment today.   Leotis Pain, MD  01/24/2017 4:28 PM    This note was created with Dragon medical transcription system.  Any error is purely unintentional

## 2017-01-24 NOTE — Progress Notes (Signed)
Dr. Lucky Cowboy to bedside for permcath removal

## 2017-01-24 NOTE — Progress Notes (Addendum)
ANTIBIOTIC CONSULT NOTE - INITIAL  Pharmacy Consult for Zosyn and vancomycin Indication: sepsis  Allergies  Allergen Reactions  . Naproxen Rash    Patient Measurements: Height: 5\' 10"  (177.8 cm) Weight: (!) 330 lb 0.5 oz (149.7 kg) IBW/kg (Calculated) : 73 Adjusted Body Weight: 101.8 kg  Vital Signs: Temp: 98.5 F (36.9 C) (12/03 1100) Temp Source: Oral (12/03 1100) BP: 114/66 (12/03 1400) Pulse Rate: 72 (12/03 1400) Intake/Output from previous day: 12/02 0701 - 12/03 0700 In: 967.7 [I.V.:917.7; IV Piggyback:50] Out: -  Intake/Output from this shift: Total I/O In: 432 [I.V.:432] Out: -   Labs: Recent Labs    01/23/17 1144 01/24/17 0253  WBC 9.9 7.2  HGB 9.9* 9.1*  PLT 218 178  CREATININE 4.92* 5.43*   Estimated Creatinine Clearance: 22 mL/min (A) (by C-G formula based on SCr of 5.43 mg/dL (H)). No results for input(s): VANCOTROUGH, VANCOPEAK, VANCORANDOM, GENTTROUGH, GENTPEAK, GENTRANDOM, TOBRATROUGH, TOBRAPEAK, TOBRARND, AMIKACINPEAK, AMIKACINTROU, AMIKACIN in the last 72 hours.   Microbiology: Recent Results (from the past 720 hour(s))  MRSA PCR Screening     Status: None   Collection Time: 12/30/16 12:36 AM  Result Value Ref Range Status   MRSA by PCR NEGATIVE NEGATIVE Final    Comment:        The GeneXpert MRSA Assay (FDA approved for NASAL specimens only), is one component of a comprehensive MRSA colonization surveillance program. It is not intended to diagnose MRSA infection nor to guide or monitor treatment for MRSA infections.   CULTURE, BLOOD (ROUTINE X 2) w Reflex to ID Panel     Status: None (Preliminary result)   Collection Time: 01/23/17 11:44 AM  Result Value Ref Range Status   Specimen Description BLOOD RIGHT ANTECUBITAL  Final   Special Requests   Final    BOTTLES DRAWN AEROBIC AND ANAEROBIC Blood Culture results may not be optimal due to an excessive volume of blood received in culture bottles   Culture  Setup Time   Final    GRAM  POSITIVE COCCI IN BOTH AEROBIC AND ANAEROBIC BOTTLES CRITICAL RESULT CALLED TO, READ BACK BY AND VERIFIED WITH: MATT MCBANE AT [id number] ON 01/24/17 [place].    Culture GRAM POSITIVE COCCI  Final   Report Status PENDING  Incomplete  CULTURE, BLOOD (ROUTINE X 2) w Reflex to ID Panel     Status: None (Preliminary result)   Collection Time: 01/23/17 11:44 AM  Result Value Ref Range Status   Specimen Description BLOOD BLOOD LEFT FOREARM  Final   Special Requests   Final    BOTTLES DRAWN AEROBIC AND ANAEROBIC Blood Culture results may not be optimal due to an excessive volume of blood received in culture bottles   Culture  Setup Time   Final    GRAM POSITIVE COCCI IN BOTH AEROBIC AND ANAEROBIC BOTTLES CRITICAL VALUE NOTED.  VALUE IS CONSISTENT WITH PREVIOUSLY REPORTED AND CALLED VALUE.    Culture GRAM POSITIVE COCCI  Final   Report Status PENDING  Incomplete  Blood Culture ID Panel (Reflexed)     Status: Abnormal   Collection Time: 01/23/17 11:44 AM  Result Value Ref Range Status   Enterococcus species NOT DETECTED NOT DETECTED Final   Listeria monocytogenes NOT DETECTED NOT DETECTED Final   Staphylococcus species DETECTED (A) NOT DETECTED Final    Comment: CRITICAL RESULT CALLED TO, READ BACK BY AND VERIFIED WITH: MATT MCBANE AT 0700 ON 01/24/17 [place].    Staphylococcus aureus DETECTED (A) NOT DETECTED Final    Comment:  Methicillin (oxacillin) susceptible Staphylococcus aureus (MSSA). Preferred therapy is anti staphylococcal beta lactam antibiotic (Cefazolin or Nafcillin), unless clinically contraindicated. CRITICAL RESULT CALLED TO, READ BACK BY AND VERIFIED WITH: MATT MCBANE AT 0700 ON 01/24/17 [place].    Methicillin resistance NOT DETECTED NOT DETECTED Final   Streptococcus species NOT DETECTED NOT DETECTED Final   Streptococcus agalactiae NOT DETECTED NOT DETECTED Final   Streptococcus pneumoniae NOT DETECTED NOT DETECTED Final   Streptococcus pyogenes NOT DETECTED NOT DETECTED Final    Acinetobacter baumannii NOT DETECTED NOT DETECTED Final   Enterobacteriaceae species NOT DETECTED NOT DETECTED Final   Enterobacter cloacae complex NOT DETECTED NOT DETECTED Final   Escherichia coli NOT DETECTED NOT DETECTED Final   Klebsiella oxytoca NOT DETECTED NOT DETECTED Final   Klebsiella pneumoniae NOT DETECTED NOT DETECTED Final   Proteus species NOT DETECTED NOT DETECTED Final   Serratia marcescens NOT DETECTED NOT DETECTED Final   Haemophilus influenzae NOT DETECTED NOT DETECTED Final   Neisseria meningitidis NOT DETECTED NOT DETECTED Final   Pseudomonas aeruginosa NOT DETECTED NOT DETECTED Final   Candida albicans NOT DETECTED NOT DETECTED Final   Candida glabrata NOT DETECTED NOT DETECTED Final   Candida krusei NOT DETECTED NOT DETECTED Final   Candida parapsilosis NOT DETECTED NOT DETECTED Final   Candida tropicalis NOT DETECTED NOT DETECTED Final  C difficile quick scan w PCR reflex     Status: None   Collection Time: 01/23/17  1:24 PM  Result Value Ref Range Status   C Diff antigen NEGATIVE NEGATIVE Final   C Diff toxin NEGATIVE NEGATIVE Final   C Diff interpretation No C. difficile detected.  Final  MRSA PCR Screening     Status: None   Collection Time: 01/23/17  2:57 PM  Result Value Ref Range Status   MRSA by PCR NEGATIVE NEGATIVE Final    Comment:        The GeneXpert MRSA Assay (FDA approved for NASAL specimens only), is one component of a comprehensive MRSA colonization surveillance program. It is not intended to diagnose MRSA infection nor to guide or monitor treatment for MRSA infections.     Medical History: Past Medical History:  Diagnosis Date  . CHF (congestive heart failure) ([place])   . COPD (chronic obstructive pulmonary disease) ([place])   . Diabetes mellitus type 2 in obese ([place])   . DM (diabetes mellitus) type II controlled with renal manifestation ([place])   . Hypercholesteremia   . Hypertension   . Morbid obesity with BMI of 45.0-49.9, adult  ([place])   . Neuropathy   . Pancreatitis, acute   . Pneumonia   . Spinal stenosis     Medications:  Infusions:  . heparin 1,650 Units/hr (01/24/17 1400)  . norepinephrine (LEVOPHED) Adult infusion 10 mcg/min (01/24/17 1400)  . piperacillin-tazobactam (ZOSYN)  IV Stopped (01/24/17 9326)   Assessment: 37 yom cc weakness/fall. Question PNA vs ADHF - stable on Bipap for admit to ICU. Pharmacy consulted to dose Zosyn and vancomycin for sepsis.   MSSA bacteremia   Plan:  After discussion with Dr. Humphrey Rolls, will continue Zosyn to cover for MSSA bacteremia for now. Will f/u plans for narrowing 12/4. For now, continue Zosyn 3.375 g EI q 12 hours.   12/3 Addendum: After discussion with Dr. Ola Spurr, will transition to cefazolin only. Will begin cefazolin 1 g iv q 12 hours which is on the higher end for acute on chronic renal failure but is justified for bacteremia. Will adjust dosing once HD initiated.  Ulice Dash, PharmD Clinical Pharmacist  01/24/2017,2:56 PM

## 2017-01-24 NOTE — Consult Note (Signed)
[facility] Infectious Disease     Reason for Consult: MSSA bacteremia   Referring Physician: Dr Bridgett Larsson Date of Admission:  01/23/2017   Active Problems:   NSTEMI (non-ST elevated myocardial infarction) [facility])   HPI: Brian GODBEE is a 56 y.o. male admitted with weakness,falls, diarrhea and hypotension and found to have temp 100.5, wbc 9, Trop 52 and Bcx + MSSA.  He has a hx of COPD, chronic diastolic congestive heart failure, end-stage renal disease recently started on hemodialysis through a R Permacath.   He was just admitted to hospital on 12/29/2016 and discharged on 01/06/2017 with CHF exacerbation.   He had been having mild pain at the HD cath site.  He is having the permacath removed today. Is on low dose levophed as well. But BP improving.   Past Medical History:  Diagnosis Date  . CHF (congestive heart failure) ([place])   . COPD (chronic obstructive pulmonary disease) ([place])   . Diabetes mellitus type 2 in obese ([place])   . DM (diabetes mellitus) type II controlled with renal manifestation ([place])   . Hypercholesteremia   . Hypertension   . Morbid obesity with BMI of 45.0-49.9, adult ([place])   . Neuropathy   . Pancreatitis, acute   . Pneumonia   . Spinal stenosis    Past Surgical History:  Procedure Laterality Date  . AMPUTATION    . CHOLECYSTECTOMY  1998  . DIALYSIS/PERMA CATHETER INSERTION N/A 01/03/2017   Procedure: DIALYSIS/PERMA CATHETER INSERTION;  Surgeon: Algernon Huxley, MD;  Location: [facility];  Service: Cardiovascular;  Laterality: N/A;  . LAPAROSCOPIC APPENDECTOMY N/A 01/06/2015   Procedure: APPENDECTOMY LAPAROSCOPIC drainage of peritoneal abcess;  Surgeon: Sherri Rad, MD;  Location: [facility];  Service: General;  Laterality: N/A;   Social History   Tobacco Use  . Smoking status: Current Every Day Smoker    Packs/day: 1.00    Types: Cigarettes  . Smokeless tobacco: Never Used  Substance Use Topics  . Alcohol use: No  . Drug use: No   Family History   Problem Relation Age of Onset  . Hypertension Mother   . Hyperlipidemia Mother   . Heart disease Father   . Heart disease Maternal Grandfather     Allergies:  Allergies  Allergen Reactions  . Naproxen Rash    Current antibiotics: Antibiotics Given (last 72 hours)    Date/Time Action Medication Dose Rate   01/23/17 1246 New Bag/Given   piperacillin-tazobactam (ZOSYN) IVPB 3.375 g 3.375 g 100 mL/hr   01/23/17 1246 New Bag/Given   vancomycin (VANCOCIN) IVPB 1000 mg/200 mL premix 1,000 mg 200 mL/hr   01/23/17 1652 New Bag/Given   vancomycin (VANCOCIN) 1,250 mg in sodium chloride 0.9 % 250 mL IVPB 1,250 mg 166.7 mL/hr   01/23/17 1715 New Bag/Given   piperacillin-tazobactam (ZOSYN) IVPB 3.375 g 3.375 g 12.5 mL/hr   01/24/17 0542 New Bag/Given   piperacillin-tazobactam (ZOSYN) IVPB 3.375 g 3.375 g 12.5 mL/hr      MEDICATIONS: . aspirin EC  81 mg Oral Daily  . atorvastatin  80 mg Oral Daily  . gabapentin  100 mg Oral QHS  . insulin aspart  0-5 Units Subcutaneous QHS  . insulin aspart  0-9 Units Subcutaneous TID WC  . levothyroxine  50 mcg Oral QAC breakfast  . sertraline  25 mg Oral Daily  . sodium chloride flush  10-40 mL Intracatheter Q12H    Review of Systems - 11 systems reviewed and negative per HPI  OBJECTIVE:  Temp:  [97.5 F (36.4 C)-99 F (37.2 C)] 98.5 F (36.9 C) (12/03 1100) Pulse Rate:  [55-84] 80 (12/03 1506) Resp:  [13-23] 15 (12/03 1506) BP: (82-129)/(46-91) 125/62 (12/03 1506) SpO2:  [88 %-100 %] 98 % (12/03 1506) Weight:  [149.7 kg (330 lb 0.5 oz)] 149.7 kg (330 lb 0.5 oz) (12/03 0500) Physical Exam  Constitutional: He is oriented to person, place, and time. Morbidly obese HENT: anicteric Mouth/Throat: Oropharynx is clear and moist. No oropharyngeal exudate.  Cardiovascular: very distant Pulmonary/Chest: Effort normal and breath sounds normal. No respiratory distress. He has no wheezes.  Abdominal: Soft. Bowel sounds are normal. He exhibits no  distension. There is no tenderness.  Lymphadenopathy: He has no cervical adenopathy.  Neurological: He is alert and oriented to person, place, and time.  Ext 1+ bil edema. L foot missing 3rd toe Skin: Skin is warm and dry. LLE with a few scaly lesions Psychiatric: He has a normal mood and affect. His behavior is normal.  Access-  R permacath site with dried clotted blood.  LABS: Results for orders placed or performed during the hospital encounter of 01/23/17 (from the past 48 hour(s))  Blood gas, venous     Status: Abnormal (Preliminary result)   Collection Time: 01/23/17 11:40 AM  Result Value Ref Range   FIO2 PENDING    Delivery systems PENDING    pH, Ven 7.31 7.250 - 7.430   pCO2, Ven 46 44.0 - 60.0 mmHg   pO2, Ven PENDING 32.0 - 45.0 mmHg   Bicarbonate 23.2 20.0 - 28.0 mmol/L   Acid-base deficit 3.1 (H) 0.0 - 2.0 mmol/L   O2 Saturation PENDING %   Patient temperature 37.0    Collection site VEIN    Sample type VEIN   CBC with Differential     Status: Abnormal   Collection Time: 01/23/17 11:44 AM  Result Value Ref Range   WBC 9.9 3.8 - 10.6 K/uL   RBC 3.30 (L) 4.40 - 5.90 MIL/uL   Hemoglobin 9.9 (L) 13.0 - 18.0 g/dL   HCT 30.2 (L) 40.0 - 52.0 %   MCV 91.5 80.0 - 100.0 fL   MCH 30.0 26.0 - 34.0 pg   MCHC 32.8 32.0 - 36.0 g/dL   RDW 15.3 (H) 11.5 - 14.5 %   Platelets 218 150 - 440 K/uL   Neutrophils Relative % 85 %   Neutro Abs 8.5 (H) 1.4 - 6.5 K/uL   Lymphocytes Relative 7 %   Lymphs Abs 0.6 (L) 1.0 - 3.6 K/uL   Monocytes Relative 7 %   Monocytes Absolute 0.7 0.2 - 1.0 K/uL   Eosinophils Relative 0 %   Eosinophils Absolute 0.0 0 - 0.7 K/uL   Basophils Relative 1 %   Basophils Absolute 0.1 0 - 0.1 K/uL  Comprehensive metabolic panel     Status: Abnormal   Collection Time: 01/23/17 11:44 AM  Result Value Ref Range   Sodium 132 (L) 135 - 145 mmol/L   Potassium 4.3 3.5 - 5.1 mmol/L   Chloride 100 (L) 101 - 111 mmol/L   CO2 20 (L) 22 - 32 mmol/L   Glucose, Bld 182  (H) 65 - 99 mg/dL   BUN 52 (H) 6 - 20 mg/dL   Creatinine, Ser 4.92 (H) 0.61 - 1.24 mg/dL   Calcium 8.5 (L) 8.9 - 10.3 mg/dL   Total Protein 7.3 6.5 - 8.1 g/dL   Albumin 3.0 (L) 3.5 - 5.0 g/dL   AST 119 (H) 15 - 41  U/L   ALT 30 17 - 63 U/L   Alkaline Phosphatase 68 38 - 126 U/L   Total Bilirubin 0.4 0.3 - 1.2 mg/dL   GFR calc non Af Amer 12 (L) >60 mL/min   GFR calc Af Amer 14 (L) >60 mL/min    Comment: (NOTE) The eGFR has been calculated using the CKD EPI equation. This calculation has not been validated in all clinical situations. eGFR's persistently <60 mL/min signify possible Chronic Kidney Disease.    Anion gap 12 5 - 15  CULTURE, BLOOD (ROUTINE X 2) w Reflex to ID Panel     Status: None (Preliminary result)   Collection Time: 01/23/17 11:44 AM  Result Value Ref Range   Specimen Description BLOOD RIGHT ANTECUBITAL    Special Requests      BOTTLES DRAWN AEROBIC AND ANAEROBIC Blood Culture results may not be optimal due to an excessive volume of blood received in culture bottles   Culture  Setup Time      GRAM POSITIVE COCCI IN BOTH AEROBIC AND ANAEROBIC BOTTLES CRITICAL RESULT CALLED TO, READ BACK BY AND VERIFIED WITH: MATT MCBANE AT [place] ON 01/24/17 [place].    Culture GRAM POSITIVE COCCI    Report Status PENDING   CULTURE, BLOOD (ROUTINE X 2) w Reflex to ID Panel     Status: None (Preliminary result)   Collection Time: 01/23/17 11:44 AM  Result Value Ref Range   Specimen Description BLOOD BLOOD LEFT FOREARM    Special Requests      BOTTLES DRAWN AEROBIC AND ANAEROBIC Blood Culture results may not be optimal due to an excessive volume of blood received in culture bottles   Culture  Setup Time      GRAM POSITIVE COCCI IN BOTH AEROBIC AND ANAEROBIC BOTTLES CRITICAL VALUE NOTED.  VALUE IS CONSISTENT WITH PREVIOUSLY REPORTED AND CALLED VALUE.    Culture GRAM POSITIVE COCCI    Report Status PENDING   Lactic acid, plasma     Status: None   Collection Time: 01/23/17 11:44 AM   Result Value Ref Range   Lactic Acid, Venous 1.5 0.5 - 1.9 mmol/L  Lipase, blood     Status: None   Collection Time: 01/23/17 11:44 AM  Result Value Ref Range   Lipase 22 11 - 51 U/L  Troponin I     Status: Abnormal   Collection Time: 01/23/17 11:44 AM  Result Value Ref Range   Troponin I >65.00 (HH) <0.03 ng/mL    Comment: CRITICAL RESULT CALLED TO, READ BACK BY AND VERIFIED WITH FELICIA STAROPOLI ON 72/6/20 AT 1226 [facility]   APTT     Status: Abnormal   Collection Time: 01/23/17 11:44 AM  Result Value Ref Range   aPTT 37 (H) 24 - 36 seconds    Comment:        IF BASELINE aPTT IS ELEVATED, SUGGEST PATIENT RISK ASSESSMENT BE USED TO DETERMINE APPROPRIATE ANTICOAGULANT THERAPY.   Protime-INR     Status: Abnormal   Collection Time: 01/23/17 11:44 AM  Result Value Ref Range   Prothrombin Time 15.9 (H) 11.4 - 15.2 seconds   INR 1.28   Blood Culture ID Panel (Reflexed)     Status: Abnormal   Collection Time: 01/23/17 11:44 AM  Result Value Ref Range   Enterococcus species NOT DETECTED NOT DETECTED   Listeria monocytogenes NOT DETECTED NOT DETECTED   Staphylococcus species DETECTED (A) NOT DETECTED    Comment: CRITICAL RESULT CALLED TO, READ BACK BY AND VERIFIED WITH: MATT  MCBANE AT 0700 ON 01/24/17 [place].    Staphylococcus aureus DETECTED (A) NOT DETECTED    Comment: Methicillin (oxacillin) susceptible Staphylococcus aureus (MSSA). Preferred therapy is anti staphylococcal beta lactam antibiotic (Cefazolin or Nafcillin), unless clinically contraindicated. CRITICAL RESULT CALLED TO, READ BACK BY AND VERIFIED WITH: MATT MCBANE AT 0700 ON 01/24/17 [place].    Methicillin resistance NOT DETECTED NOT DETECTED   Streptococcus species NOT DETECTED NOT DETECTED   Streptococcus agalactiae NOT DETECTED NOT DETECTED   Streptococcus pneumoniae NOT DETECTED NOT DETECTED   Streptococcus pyogenes NOT DETECTED NOT DETECTED   Acinetobacter baumannii NOT DETECTED NOT DETECTED   Enterobacteriaceae  species NOT DETECTED NOT DETECTED   Enterobacter cloacae complex NOT DETECTED NOT DETECTED   Escherichia coli NOT DETECTED NOT DETECTED   Klebsiella oxytoca NOT DETECTED NOT DETECTED   Klebsiella pneumoniae NOT DETECTED NOT DETECTED   Proteus species NOT DETECTED NOT DETECTED   Serratia marcescens NOT DETECTED NOT DETECTED   Haemophilus influenzae NOT DETECTED NOT DETECTED   Neisseria meningitidis NOT DETECTED NOT DETECTED   Pseudomonas aeruginosa NOT DETECTED NOT DETECTED   Candida albicans NOT DETECTED NOT DETECTED   Candida glabrata NOT DETECTED NOT DETECTED   Candida krusei NOT DETECTED NOT DETECTED   Candida parapsilosis NOT DETECTED NOT DETECTED   Candida tropicalis NOT DETECTED NOT DETECTED  C difficile quick scan w PCR reflex     Status: None   Collection Time: 01/23/17  1:24 PM  Result Value Ref Range   C Diff antigen NEGATIVE NEGATIVE   C Diff toxin NEGATIVE NEGATIVE   C Diff interpretation No C. difficile detected.   Glucose, capillary     Status: Abnormal   Collection Time: 01/23/17  2:20 PM  Result Value Ref Range   Glucose-Capillary 197 (H) 65 - 99 mg/dL  MRSA PCR Screening     Status: None   Collection Time: 01/23/17  2:57 PM  Result Value Ref Range   MRSA by PCR NEGATIVE NEGATIVE    Comment:        The GeneXpert MRSA Assay (FDA approved for NASAL specimens only), is one component of a comprehensive MRSA colonization surveillance program. It is not intended to diagnose MRSA infection nor to guide or monitor treatment for MRSA infections.   Troponin I     Status: Abnormal   Collection Time: 01/23/17  3:09 PM  Result Value Ref Range   Troponin I 62.91 (HH) <0.03 ng/mL    Comment: CRITICAL VALUE NOTED. VALUE IS CONSISTENT WITH PREVIOUSLY REPORTED/CALLED VALUE BY CAF   Glucose, capillary     Status: Abnormal   Collection Time: 01/23/17  5:07 PM  Result Value Ref Range   Glucose-Capillary 129 (H) 65 - 99 mg/dL  Troponin I     Status: Abnormal   Collection  Time: 01/23/17  8:57 PM  Result Value Ref Range   Troponin I 53.26 (HH) <0.03 ng/mL    Comment: CRITICAL VALUE NOTED. VALUE IS CONSISTENT WITH PREVIOUSLY REPORTED/CALLED VALUE BY CAF   Heparin level (unfractionated)     Status: Abnormal   Collection Time: 01/23/17  8:57 PM  Result Value Ref Range   Heparin Unfractionated <0.10 (L) 0.30 - 0.70 IU/mL    Comment:        IF HEPARIN RESULTS ARE BELOW EXPECTED VALUES, AND PATIENT DOSAGE HAS BEEN CONFIRMED, SUGGEST FOLLOW UP TESTING OF ANTITHROMBIN III LEVELS. REPEATED TO VERIFY   Magnesium     Status: None   Collection Time: 01/23/17  8:57 PM  Result Value Ref Range   Magnesium 1.7 1.7 - 2.4 mg/dL  Phosphorus     Status: None   Collection Time: 01/23/17  8:57 PM  Result Value Ref Range   Phosphorus 3.8 2.5 - 4.6 mg/dL  Blood gas, arterial     Status: Abnormal   Collection Time: 01/23/17  9:43 PM  Result Value Ref Range   FIO2 0.28    Delivery systems BILEVEL POSITIVE AIRWAY PRESSURE    Inspiratory PAP 20    Expiratory PAP 10.0    pH, Arterial 7.33 (L) 7.350 - 7.450   pCO2 arterial 37 32.0 - 48.0 mmHg   pO2, Arterial 99 83.0 - 108.0 mmHg   Bicarbonate 19.5 (L) 20.0 - 28.0 mmol/L   Acid-base deficit 5.9 (H) 0.0 - 2.0 mmol/L   O2 Saturation 97.2 %   Patient temperature 37.0    Collection site RIGHT RADIAL    Sample type ARTERIAL DRAW    Allens test (pass/fail) PASS PASS   Mechanical Rate 12   Glucose, capillary     Status: Abnormal   Collection Time: 01/23/17 10:16 PM  Result Value Ref Range   Glucose-Capillary 152 (H) 65 - 99 mg/dL  TSH     Status: None   Collection Time: 01/24/17  2:53 AM  Result Value Ref Range   TSH 1.784 0.350 - 4.500 uIU/mL    Comment: Performed by a 3rd Generation assay with a functional sensitivity of <=0.01 uIU/mL.  CBC     Status: Abnormal   Collection Time: 01/24/17  2:53 AM  Result Value Ref Range   WBC 7.2 3.8 - 10.6 K/uL   RBC 3.01 (L) 4.40 - 5.90 MIL/uL   Hemoglobin 9.1 (L) 13.0 - 18.0  g/dL   HCT 27.4 (L) 40.0 - 52.0 %   MCV 91.0 80.0 - 100.0 fL   MCH 30.2 26.0 - 34.0 pg   MCHC 33.2 32.0 - 36.0 g/dL   RDW 15.2 (H) 11.5 - 14.5 %   Platelets 178 150 - 440 K/uL  Basic metabolic panel     Status: Abnormal   Collection Time: 01/24/17  2:53 AM  Result Value Ref Range   Sodium 132 (L) 135 - 145 mmol/L   Potassium 3.9 3.5 - 5.1 mmol/L   Chloride 103 101 - 111 mmol/L   CO2 18 (L) 22 - 32 mmol/L   Glucose, Bld 153 (H) 65 - 99 mg/dL   BUN 58 (H) 6 - 20 mg/dL   Creatinine, Ser 5.43 (H) 0.61 - 1.24 mg/dL   Calcium 8.0 (L) 8.9 - 10.3 mg/dL   GFR calc non Af Amer 11 (L) >60 mL/min   GFR calc Af Amer 12 (L) >60 mL/min    Comment: (NOTE) The eGFR has been calculated using the CKD EPI equation. This calculation has not been validated in all clinical situations. eGFR's persistently <60 mL/min signify possible Chronic Kidney Disease.    Anion gap 11 5 - 15  Lipid panel     Status: Abnormal   Collection Time: 01/24/17  2:53 AM  Result Value Ref Range   Cholesterol 83 0 - 200 mg/dL   Triglycerides 160 (H) <150 mg/dL   HDL 26 (L) >40 mg/dL   Total CHOL/HDL Ratio 3.2 RATIO   VLDL 32 0 - 40 mg/dL   LDL Cholesterol 25 0 - 99 mg/dL    Comment:        Total Cholesterol/HDL:CHD Risk Coronary Heart Disease Risk Table  Men   Women  1/2 Average Risk   3.4   3.3  Average Risk       5.0   4.4  2 X Average Risk   9.6   7.1  3 X Average Risk  23.4   11.0        Use the calculated Patient Ratio above and the CHD Risk Table to determine the patient's CHD Risk.        ATP III CLASSIFICATION (LDL):  <100     mg/dL   Optimal  100-129  mg/dL   Near or Above                    Optimal  130-159  mg/dL   Borderline  160-189  mg/dL   High  >190     mg/dL   Very High   Troponin I     Status: Abnormal   Collection Time: 01/24/17  2:53 AM  Result Value Ref Range   Troponin I 52.89 (HH) <0.03 ng/mL    Comment: CRITICAL VALUE NOTED. VALUE IS CONSISTENT WITH PREVIOUSLY  REPORTED/CALLED VALUE.PMH  Glucose, capillary     Status: Abnormal   Collection Time: 01/24/17  7:31 AM  Result Value Ref Range   Glucose-Capillary 132 (H) 65 - 99 mg/dL  Troponin I     Status: Abnormal   Collection Time: 01/24/17  9:21 AM  Result Value Ref Range   Troponin I 36.73 (HH) <0.03 ng/mL    Comment: CRITICAL RESULT CALLED TO, READ BACK BY AND VERIFIED WITH [facility] OLEJAR AT 1006 01/24/17 [place]   Heparin level (unfractionated)     Status: Abnormal   Collection Time: 01/24/17  9:21 AM  Result Value Ref Range   Heparin Unfractionated 0.22 (L) 0.30 - 0.70 IU/mL    Comment:        IF HEPARIN RESULTS ARE BELOW EXPECTED VALUES, AND PATIENT DOSAGE HAS BEEN CONFIRMED, SUGGEST FOLLOW UP TESTING OF ANTITHROMBIN III LEVELS.   Glucose, capillary     Status: Abnormal   Collection Time: 01/24/17 11:11 AM  Result Value Ref Range   Glucose-Capillary 158 (H) 65 - 99 mg/dL   No components found for: ESR, C REACTIVE PROTEIN MICRO: Recent Results (from the past 720 hour(s))  MRSA PCR Screening     Status: None   Collection Time: 12/30/16 12:36 AM  Result Value Ref Range Status   MRSA by PCR NEGATIVE NEGATIVE Final    Comment:        The GeneXpert MRSA Assay (FDA approved for NASAL specimens only), is one component of a comprehensive MRSA colonization surveillance program. It is not intended to diagnose MRSA infection nor to guide or monitor treatment for MRSA infections.   CULTURE, BLOOD (ROUTINE X 2) w Reflex to ID Panel     Status: None (Preliminary result)   Collection Time: 01/23/17 11:44 AM  Result Value Ref Range Status   Specimen Description BLOOD RIGHT ANTECUBITAL  Final   Special Requests   Final    BOTTLES DRAWN AEROBIC AND ANAEROBIC Blood Culture results may not be optimal due to an excessive volume of blood received in culture bottles   Culture  Setup Time   Final    GRAM POSITIVE COCCI IN BOTH AEROBIC AND ANAEROBIC BOTTLES CRITICAL RESULT CALLED TO, READ BACK BY  AND VERIFIED WITH: MATT MCBANE [place] [id number] ON 01/24/17 [place].    Culture GRAM POSITIVE COCCI  Final   Report Status PENDING  Incomplete  CULTURE,  BLOOD (ROUTINE X 2) w Reflex to ID Panel     Status: None (Preliminary result)   Collection Time: 01/23/17 11:44 AM  Result Value Ref Range Status   Specimen Description BLOOD BLOOD LEFT FOREARM  Final   Special Requests   Final    BOTTLES DRAWN AEROBIC AND ANAEROBIC Blood Culture results may not be optimal due to an excessive volume of blood received in culture bottles   Culture  Setup Time   Final    GRAM POSITIVE COCCI IN BOTH AEROBIC AND ANAEROBIC BOTTLES CRITICAL VALUE NOTED.  VALUE IS CONSISTENT WITH PREVIOUSLY REPORTED AND CALLED VALUE.    Culture GRAM POSITIVE COCCI  Final   Report Status PENDING  Incomplete  Blood Culture ID Panel (Reflexed)     Status: Abnormal   Collection Time: 01/23/17 11:44 AM  Result Value Ref Range Status   Enterococcus species NOT DETECTED NOT DETECTED Final   Listeria monocytogenes NOT DETECTED NOT DETECTED Final   Staphylococcus species DETECTED (A) NOT DETECTED Final    Comment: CRITICAL RESULT CALLED TO, READ BACK BY AND VERIFIED WITH: MATT MCBANE AT 0700 ON 01/24/17 [place].    Staphylococcus aureus DETECTED (A) NOT DETECTED Final    Comment: Methicillin (oxacillin) susceptible Staphylococcus aureus (MSSA). Preferred therapy is anti staphylococcal beta lactam antibiotic (Cefazolin or Nafcillin), unless clinically contraindicated. CRITICAL RESULT CALLED TO, READ BACK BY AND VERIFIED WITH: MATT MCBANE AT 0700 ON 01/24/17 [place].    Methicillin resistance NOT DETECTED NOT DETECTED Final   Streptococcus species NOT DETECTED NOT DETECTED Final   Streptococcus agalactiae NOT DETECTED NOT DETECTED Final   Streptococcus pneumoniae NOT DETECTED NOT DETECTED Final   Streptococcus pyogenes NOT DETECTED NOT DETECTED Final   Acinetobacter baumannii NOT DETECTED NOT DETECTED Final   Enterobacteriaceae species NOT DETECTED NOT  DETECTED Final   Enterobacter cloacae complex NOT DETECTED NOT DETECTED Final   Escherichia coli NOT DETECTED NOT DETECTED Final   Klebsiella oxytoca NOT DETECTED NOT DETECTED Final   Klebsiella pneumoniae NOT DETECTED NOT DETECTED Final   Proteus species NOT DETECTED NOT DETECTED Final   Serratia marcescens NOT DETECTED NOT DETECTED Final   Haemophilus influenzae NOT DETECTED NOT DETECTED Final   Neisseria meningitidis NOT DETECTED NOT DETECTED Final   Pseudomonas aeruginosa NOT DETECTED NOT DETECTED Final   Candida albicans NOT DETECTED NOT DETECTED Final   Candida glabrata NOT DETECTED NOT DETECTED Final   Candida krusei NOT DETECTED NOT DETECTED Final   Candida parapsilosis NOT DETECTED NOT DETECTED Final   Candida tropicalis NOT DETECTED NOT DETECTED Final  C difficile quick scan w PCR reflex     Status: None   Collection Time: 01/23/17  1:24 PM  Result Value Ref Range Status   C Diff antigen NEGATIVE NEGATIVE Final   C Diff toxin NEGATIVE NEGATIVE Final   C Diff interpretation No C. difficile detected.  Final  MRSA PCR Screening     Status: None   Collection Time: 01/23/17  2:57 PM  Result Value Ref Range Status   MRSA by PCR NEGATIVE NEGATIVE Final    Comment:        The GeneXpert MRSA Assay (FDA approved for NASAL specimens only), is one component of a comprehensive MRSA colonization surveillance program. It is not intended to diagnose MRSA infection nor to guide or monitor treatment for MRSA infections.     IMAGING: Dg Chest 1 View  Result Date: 01/23/2017 CLINICAL DATA:  Central line placement. EXAM: CHEST 1 VIEW COMPARISON:  01/23/2017  and prior radiographs FINDINGS: A left IJ central venous catheter is now noted with tip overlying upper SVC. A right IJ central venous catheter is again noted with tip overlying the mid SVC. Cardiomegaly and pulmonary vascular congestion again identified. Slight decrease in interstitial opacities/edema noted. There is no evidence of  pneumothorax. IMPRESSION: Left IJ central venous catheter placement with tip overlying the upper SVC. No evidence of pneumothorax. Slight decrease in interstitial opacities/edema. Cardiomegaly and pulmonary vascular congestion. Electronically Signed   By: Margarette Canada M.D.   On: 01/23/2017 16:26   [place] Ue Vein Mapping Left  Result Date: 01/05/2017 CLINICAL DATA:  End-stage renal disease EXAM: [place] EXTREM UP VEIN MAPPING COMPARISON:  None. FINDINGS: LEFT ARTERIES Wrist Radial Artery: Size 1.8m  Waveform triphasic Wrist Ulnar Artery: Size 1.747m Waveform triphasic Prox. Forearm Radial Artery: Size 1.69m64mWaveform triphasic Upper Arm Brachial Artery: Size 5.2mm67maveform triphasic LEFT VEINS Forearm Cephalic Vein: Prox 3.39m0.8MVtal 2.39mm 76mth 6.6-37.8-4.6NGarm Basilic Vein: Prox diminutive Distal diminutive Upper Arm Cephalic Vein: Prox 5.3mm2.9BMal 4.7mm D27mh 13.7 -4.4mm Th76m is a segment of wall thickening without occlusion All Upper Arm Basilic Vein: Prox 4mm Dis739m 1.5mm Dept5m5.3 -6.69mm Upper27mm Brachial Vein: Prox 6.2mm Distal16m3mm Depth 086m0.1 -15.5mm ADDITION28mLEFT VEINS Axillary Vein:  9.3mm Subclavia48mein: Patient: Yes Respiratory Phasicity: Present Internal Jugular Vein: Patent: Yes    Respiratory Phasicity: Present Branches > 2 mm: At least 1 from the cephalic vein in the upper arm, 3 mm At least 1 from the basilic vein, upper arm, 1.4 mm At least 4 from the brachial vein in the upper arm, 2.3-4.5 mm Note made of patent right subclavian and IJ veins with respiratory phasicity. IMPRESSION: Patent left upper extremity vasculature as detailed above. Electronically Signed   By: D  Hassell M.DLucrezia [place]/14/2018 09:03   Dg Chest Port 1 View  Result Date: 01/23/2017 CLINICAL DATA:  Cough, fever EXAM: PORTABLE CHEST 1 VIEW COMPARISON:  12/31/2016 FINDINGS: Right dialysis catheter in place with the tip at the cavoatrial junction. Cardiomegaly with diffuse interstitial opacities likely edema. No visible  effusions or acute bony abnormality. IMPRESSION: Cardiomegaly. Diffuse interstitial opacities in the mid and lower lungs, likely edema/ CHF. Electronically Signed   By: Kevin  Dover MRolm Baptise/03/2016 12:27   Dg Chest Port 1 View  Result Date: 12/31/2016 CLINICAL DATA:  Central line placement EXAM: PORTABLE CHEST 1 VIEW COMPARISON:  12/29/2016 FINDINGS: Right jugular central venous catheter tip in the proximal SVC. No pneumothorax Progression of pulmonary vascular congestion and mild edema. No effusion. IMPRESSION: Central line placement in the proximal SVC.  No pneumothorax Progression of heart failure with mild edema. Electronically Signed   By: Charles  ClarkFranchot Gallo/10/2016 15:01   Dg Chest Portable 1 View  Result Date: 12/29/2016 CLINICAL DATA:  Initial evaluation for acute shortness of breath. History of CHF, COPD, hypertension. EXAM: PORTABLE CHEST 1 VIEW COMPARISON:  Prior radiograph from 02/26/2016. FINDINGS: Moderate cardiomegaly, stable. Apparent widening of the mediastinum favored to be related to AP technique, shallow lung inflation, and lordotic angulation. Lungs normally inflated. Diffuse pulmonary vascular and interstitial congestion without frank pulmonary edema. Underlying COPD. No focal infiltrates. No appreciable pleural effusion. Mild right basilar subsegmental atelectasis. No pneumothorax. No acute osseus abnormality. IMPRESSION: 1. Cardiomegaly with mild diffuse pulmonary interstitial congestion without frank pulmonary edema. 2. Mild right basilar atelectasis. Electronically Signed   By: Benjamin  McClPincus Badder  On: 12/29/2016 17:14   Dg Hip Unilat W Or Wo Pelvis 2-3 Views Left  Result Date: 12/29/2016 CLINICAL DATA:  Initial evaluation for multiple recent falls, pain. EXAM: DG HIP (WITH OR WITHOUT PELVIS) 2-3V LEFT COMPARISON:  Prior radiograph from 07/12/2016. FINDINGS: Left hip in slight internal rotation. No acute fracture dislocation. Femoral head in normal  alignment with the acetabulum. Heterotopic calcification adjacent to the greater trochanter is stable. Bony pelvis intact. SI joints approximated. Mild degenerative osteoarthrosis. Degenerative changes noted within lower lumbar spine. No acute soft tissue abnormality.  Atherosclerosis. IMPRESSION: 1. No acute osseous abnormality about the left hip. 2. Mild degenerative osteoarthrosis. Electronically Signed   By: Jeannine Boga M.D.   On: 12/29/2016 17:12   Dg Hip Unilat  With Pelvis 2-3 Views Right  Result Date: 12/29/2016 CLINICAL DATA:  Initial evaluation for multiple recent falls, acute right hip pain. EXAM: DG HIP (WITH OR WITHOUT PELVIS) 2-3V RIGHT COMPARISON:  None. FINDINGS: No acute fracture or dislocation. Femoral head in normal alignment with the acetabulum. Femoral head height preserved. Bony pelvis intact. SI joints approximated. Mild degenerative osteoarthrosis about the hips bilaterally. Degenerative changes noted within lower lumbar spine. No acute soft tissue abnormality.  Atherosclerosis noted. IMPRESSION: 1. No acute osseous abnormality about the right hip. 2. Mild degenerative osteoarthrosis. Electronically Signed   By: Jeannine Boga M.D.   On: 12/29/2016 17:10    Assessment:   Brian [name] is a 56 y.o. male with Morbid obesity, CHF, ESRD on HD recently through R permacath now with MSSA bacteremia, sepsis and NSTEMI.  He has no evidence of metastatic infection- does have baseline back pain but no worsening. He does not have a PPM.  Will need removal of HD cath, repeat bcx and w/u for endocarditis Recommendations Can change to ancef. Check echo - if stable can consider TEE if TTE neg for vegetations.  Repeat bcx after HD cath removed Min 2 weeks IV abx will be needed.  Thank you very much for allowing me to participate in the care of this patient. Please call with questions.   Cheral Marker. Ola Spurr, MD

## 2017-01-24 NOTE — Consult Note (Signed)
ANTICOAGULATION CONSULT NOTE - Initial Consult  Pharmacy Consult for heparin drip Indication: chest pain/ACS  Allergies  Allergen Reactions  . Naproxen Rash    Patient Measurements: Height: 5\' 10"  (177.8 cm) Weight: (!) 330 lb 0.5 oz (149.7 kg) IBW/kg (Calculated) : 73 Heparin Dosing Weight: 107.4kg  Vital Signs: Temp: 98.5 F (36.9 C) (12/03 1100) Temp Source: Oral (12/03 1100) BP: 114/66 (12/03 1400) Pulse Rate: 72 (12/03 1400)  Labs: Recent Labs    01/23/17 1144  01/23/17 2057 01/24/17 0253 01/24/17 0921  HGB 9.9*  --   --  9.1*  --   HCT 30.2*  --   --  27.4*  --   PLT 218  --   --  178  --   APTT 37*  --   --   --   --   LABPROT 15.9*  --   --   --   --   INR 1.28  --   --   --   --   HEPARINUNFRC  --   --  <0.10*  --  0.22*  CREATININE 4.92*  --   --  5.43*  --   TROPONINI >65.00*   < > 53.26* 52.89* 36.73*   < > = values in this interval not displayed.    Estimated Creatinine Clearance: 22 mL/min (A) (by C-G formula based on SCr of 5.43 mg/dL (H)).   Medical History: Past Medical History:  Diagnosis Date  . CHF (congestive heart failure) ([place])   . COPD (chronic obstructive pulmonary disease) ([place])   . Diabetes mellitus type 2 in obese ([place])   . DM (diabetes mellitus) type II controlled with renal manifestation ([place])   . Hypercholesteremia   . Hypertension   . Morbid obesity with BMI of 45.0-49.9, adult ([place])   . Neuropathy   . Pancreatitis, acute   . Pneumonia   . Spinal stenosis     Medications:  Scheduled:  . aspirin EC  81 mg Oral Daily  . atorvastatin  80 mg Oral Daily  . gabapentin  100 mg Oral QHS  . heparin  1,600 Units Intravenous Once  . insulin aspart  0-5 Units Subcutaneous QHS  . insulin aspart  0-9 Units Subcutaneous TID WC  . levothyroxine  50 mcg Oral QAC breakfast  . sertraline  25 mg Oral Daily  . sodium chloride flush  10-40 mL Intracatheter Q12H    Assessment: Pt is a 56 year old obese male who was brought in by EMS  with generalized weakness, hypotension. Pt found to have a troponin of >65. Pharmacy consulted to dose heparin drip with no bolus- per Dr. Margaretmary Eddy.   Goal of Therapy:  Heparin level 0.3-0.7 units/ml Monitor platelets by anticoagulation protocol: Yes   Plan:  Will bolus heparin 1600 units iv once and increase infusion to 1850 units/hr. Will recheck HL in 8 hours.   Ulice Dash, PharmD Clinical Pharmacist  01/24/2017,2:54 PM

## 2017-01-24 NOTE — Progress Notes (Signed)
Name: GWEN EDLER MRN: [id number] DOB: August 24, 1960     BRIEF PATIENT DESCRIPTION:  56 yo male with PMH ESRD-on hemodialysis admitted 12/2 with hypotension concerning for possible septic vs. cardiogenic shock or combination of both requiring levophed gtt, diarrhea, nausea/vomiting, pulmonary edema, and NSTEMI   SIGNIFICANT EVENTS  12/2-Pt admitted to [facility]  12/3-Blood cultures +MSSA   STUDIES:  Echo 12/3>>  SUBJECTIVE:  No acute issues overnight, pt stating he feels better overall only complaint is he has a headache.  VITAL SIGNS: Temp:  [97.5 F (36.4 C)-100.5 F (38.1 C)] 97.5 F (36.4 C) (12/03 0800) Pulse Rate:  [55-86] 74 (12/03 0830) Resp:  [13-23] 19 (12/03 0830) BP: (76-129)/(46-91) 108/67 (12/03 0830) SpO2:  [88 %-100 %] 88 % (12/03 0830) Weight:  [145.2 kg (320 lb)-149.7 kg (330 lb 0.5 oz)] 149.7 kg (330 lb 0.5 oz) (12/03 0500)  PHYSICAL EXAMINATION: General: well developed, well nourished male, NAD  Neuro: alert and oriented, follows commands HEENT: supple, no JVD  Cardiovascular: s1s2, no M/R/G  Lungs: faint bibasilar crackles, even, non labored  Abdomen: +BS x4, obese, soft, non tender, non distended  Musculoskeletal: 2+ bilateral lower extremity edema, moves all extremities, right great toe amputation  Skin: intact no rashes or lesions   Recent Labs  Lab 01/23/17 1144 01/24/17 0253  NA 132* 132*  K 4.3 3.9  CL 100* 103  CO2 20* 18*  BUN 52* 58*  CREATININE 4.92* 5.43*  GLUCOSE 182* 153*   Recent Labs  Lab 01/23/17 1144 01/24/17 0253  HGB 9.9* 9.1*  HCT 30.2* 27.4*  WBC 9.9 7.2  PLT 218 178   Dg Chest 1 View  Result Date: 01/23/2017 CLINICAL DATA:  Central line placement. EXAM: CHEST 1 VIEW COMPARISON:  01/23/2017 and prior radiographs FINDINGS: A left IJ central venous catheter is now noted with tip overlying upper SVC. A right IJ central venous catheter is again noted with tip overlying the mid SVC. Cardiomegaly and pulmonary  vascular congestion again identified. Slight decrease in interstitial opacities/edema noted. There is no evidence of pneumothorax. IMPRESSION: Left IJ central venous catheter placement with tip overlying the upper SVC. No evidence of pneumothorax. Slight decrease in interstitial opacities/edema. Cardiomegaly and pulmonary vascular congestion. Electronically Signed   By: Margarette Canada M.D.   On: 01/23/2017 16:26   Dg Chest Port 1 View  Result Date: 01/23/2017 CLINICAL DATA:  Cough, fever EXAM: PORTABLE CHEST 1 VIEW COMPARISON:  12/31/2016 FINDINGS: Right dialysis catheter in place with the tip at the cavoatrial junction. Cardiomegaly with diffuse interstitial opacities likely edema. No visible effusions or acute bony abnormality. IMPRESSION: Cardiomegaly. Diffuse interstitial opacities in the mid and lower lungs, likely edema/ CHF. Electronically Signed   By: Rolm Baptise M.D.   On: 01/23/2017 12:27    ASSESSMENT / PLAN Septic Shock secondary to MSSA bacteremia  with possible component of cardiogenic shock   (Blood culture 12/2>>1/2 staph aureus/species, gram +cocci) Diarrhea  End-stage renal disease-on dialysis Tuesday Thursday Saturday ACS Anemia of chronic disease Secondary hyperparathyroidism OSA/OHS-on home CPAP Hx: COPD, CHF, Diabetes, Dyslipidemia, HTN, Morbid Obesity, Ongoing Tobacco Abuse, and Spinal Stenosis  P: Continue vancomycin and Zosyn. Remove permcath today plans for placement of temporary HD cath on 12/4 HD planned for 12/4 Follow cultures GI Panel pending  Continue levophed gtt to maintain map >65 CVP monitoring  Cardiology on board for acute coronary syndrome.  Appreciate input. Continue aspirin, statins, heparin drip. Cannot give beta-blocker secondary to hypotension. Trend troponins Echocardiogram  pending  Holding home Coreg and torsemide given his hypotension   Continue albuterol every 4 hours as needed.  Nephrology on board.  Dialysis per nephrology.   Appreciate assistance.  Anemia of chronic disease.  Monitor hemoglobin daily.  Transfuse for hemoglobin less than 7.  Continue gabapentin Continue Synthroid Continue Zoloft  Insulin sliding scale for glycemic control  Patient is on CPAP at home for OHS/OSA.  Continue Bipap qhs  Smoking Cessation counseling provided   [name], [place] Pager [phone] (please enter 7 digits) PCCM Consult Pager [phone] (please enter 7 digits)

## 2017-01-24 NOTE — Progress Notes (Signed)
Patient has been drowsy most of the shift, but appropriate when questioned asked. Was on bipap til around 0300 and has been awake and alert on and off since. Requested pain medication for chronic back pain and headache. Medicated with Tylenol and back pain improved.  RT put pt on [place] at 4L and tolerating.

## 2017-01-24 NOTE — Progress Notes (Signed)
PHARMACY - PHYSICIAN COMMUNICATION CRITICAL VALUE ALERT - BLOOD CULTURE IDENTIFICATION (BCID)  Brian [name] is an 56 y.o. male who presented to [facility] on [facility]/23/2017 with a chief complaint of weakness  Assessment:  BCID 4/4 Staph aureus mec A(-) (include suspected source if known)  Name of physician (or Provider) Contacted: [place]  Current antibiotics: Vanc and Zosyn  Changes to prescribed antibiotics recommended: Leave as is for now. Septic on pressor tx.  No results found for this or any previous visit.  [name] 01/24/2017  7:07 AM

## 2017-01-25 ENCOUNTER — Inpatient Hospital Stay
Admit: 2017-01-25 | Discharge: 2017-01-25 | Disposition: A | Discharge status: Home / Self Care | Payer: Medicaid Other | Attending: Internal Medicine | Admitting: Internal Medicine

## 2017-01-25 ENCOUNTER — Encounter: Payer: Self-pay | Admitting: Vascular Surgery

## 2017-01-25 ENCOUNTER — Inpatient Hospital Stay: Payer: Medicaid Other

## 2017-01-25 DIAGNOSIS — Z992 Dependence on renal dialysis: Secondary | ICD-10-CM

## 2017-01-25 DIAGNOSIS — R7881 Bacteremia: Secondary | ICD-10-CM

## 2017-01-25 DIAGNOSIS — I959 Hypotension, unspecified: Secondary | ICD-10-CM

## 2017-01-25 DIAGNOSIS — I214 Non-ST elevation (NSTEMI) myocardial infarction: Secondary | ICD-10-CM

## 2017-01-25 DIAGNOSIS — N186 End stage renal disease: Secondary | ICD-10-CM

## 2017-01-25 DIAGNOSIS — N170 Acute kidney failure with tubular necrosis: Secondary | ICD-10-CM

## 2017-01-25 LAB — MAGNESIUM: Magnesium: 2 mg/dL (ref 1.7–2.4)

## 2017-01-25 LAB — GASTROINTESTINAL PANEL BY PCR, STOOL (REPLACES STOOL CULTURE)
ADENOVIRUS F40/41: NOT DETECTED
Astrovirus: NOT DETECTED
CRYPTOSPORIDIUM: NOT DETECTED
Campylobacter species: NOT DETECTED
Cyclospora cayetanensis: NOT DETECTED
ENTAMOEBA HISTOLYTICA: NOT DETECTED
ENTEROAGGREGATIVE E COLI (EAEC): NOT DETECTED
ENTEROPATHOGENIC E COLI (EPEC): DETECTED — AB
Enterotoxigenic E coli (ETEC): NOT DETECTED
GIARDIA LAMBLIA: NOT DETECTED
Norovirus GI/GII: NOT DETECTED
Plesimonas shigelloides: NOT DETECTED
ROTAVIRUS A: NOT DETECTED
Salmonella species: NOT DETECTED
Sapovirus (I, II, IV, and V): NOT DETECTED
Shiga like toxin producing E coli (STEC): NOT DETECTED
Shigella/Enteroinvasive E coli (EIEC): NOT DETECTED
VIBRIO CHOLERAE: NOT DETECTED
Vibrio species: NOT DETECTED
YERSINIA ENTEROCOLITICA: NOT DETECTED

## 2017-01-25 LAB — CATH TIP CULTURE: Culture: >100000

## 2017-01-25 LAB — TROPONIN I: TROPONIN I: 19.35 ng/mL — AB (ref <0.03)

## 2017-01-25 LAB — GLUCOSE, CAPILLARY
GLUCOSE-CAPILLARY: 137 mg/dL — AB (ref 65–99)
GLUCOSE-CAPILLARY: 139 mg/dL — AB (ref 65–99)
Glucose-Capillary: 159 mg/dL — ABNORMAL HIGH (ref 65–99)
Glucose-Capillary: 163 mg/dL — ABNORMAL HIGH (ref 65–99)
Glucose-Capillary: 135 mg/dL — ABNORMAL HIGH (ref 65–99)

## 2017-01-25 LAB — ECHOCARDIOGRAM COMPLETE
Height: 70 in
Weight: 5280.46 oz

## 2017-01-25 LAB — BASIC METABOLIC PANEL
Anion gap: 11 (ref 5–15)
BUN: 72 mg/dL — AB (ref 6–20)
CHLORIDE: 101 mmol/L (ref 101–111)
CO2: 17 mmol/L — AB (ref 22–32)
CREATININE: 6.61 mg/dL — AB (ref 0.61–1.24)
Calcium: 8 mg/dL — ABNORMAL LOW (ref 8.9–10.3)
GFR calc Af Amer: 10 mL/min — ABNORMAL LOW (ref >60)
GFR calc non Af Amer: 8 mL/min — ABNORMAL LOW (ref >60)
GLUCOSE: 159 mg/dL — AB (ref 65–99)
POTASSIUM: 3.7 mmol/L (ref 3.5–5.1)
Sodium: 129 mmol/L — ABNORMAL LOW (ref 135–145)

## 2017-01-25 LAB — PHOSPHORUS: Phosphorus: 5.3 mg/dL — ABNORMAL HIGH (ref 2.5–4.6)

## 2017-01-25 LAB — MISC LABCORP TEST (SEND OUT): LABCORP TEST CODE: 1453

## 2017-01-25 LAB — CBC
HEMATOCRIT: 26.4 % — AB (ref 40.0–52.0)
Hemoglobin: 8.8 g/dL — ABNORMAL LOW (ref 13.0–18.0)
MCH: 30 pg (ref 26.0–34.0)
MCHC: 33.2 g/dL (ref 32.0–36.0)
MCV: 90.3 fL (ref 80.0–100.0)
PLATELETS: 161 10*3/uL (ref 150–440)
RBC: 2.93 MIL/uL — ABNORMAL LOW (ref 4.40–5.90)
RDW: 15.3 % — AB (ref 11.5–14.5)
WBC: 7.2 10*3/uL (ref 3.8–10.6)

## 2017-01-25 LAB — HEPARIN LEVEL (UNFRACTIONATED)
HEPARIN UNFRACTIONATED: 0.2 [IU]/mL — AB (ref 0.30–0.70)
Heparin Unfractionated: 0.18 IU/mL — ABNORMAL LOW (ref 0.30–0.70)
Heparin Unfractionated: 0.39 IU/mL (ref 0.30–0.70)

## 2017-01-25 MED ORDER — OXYCODONE HCL 5 MG PO TABS
5.0000 mg | ORAL_TABLET | ORAL | Status: DC | PRN
  Administered 2017-01-25 – 2017-01-30 (×5): 5 mg via ORAL
  Filled 2017-01-25 (×5): qty 1

## 2017-01-25 MED ORDER — CEFAZOLIN SODIUM 1 G IJ SOLR
INTRAMUSCULAR | Status: DC
  Filled 2017-01-25: qty 10

## 2017-01-25 MED ORDER — DEXTROSE 5 % IV SOLN
INTRAVENOUS | Status: DC
  Administered 2017-01-25: 20:00:00 via INTRAVENOUS
  Filled 2017-01-25 (×2): qty 10

## 2017-01-25 MED ORDER — HYDROCODONE-ACETAMINOPHEN 5-325 MG PO TABS
1.0000 | ORAL_TABLET | ORAL | Status: AC
  Administered 2017-01-25: 1 via ORAL
  Filled 2017-01-25: qty 1

## 2017-01-25 MED ORDER — HEPARIN SODIUM (PORCINE) 1000 UNIT/ML IJ SOLN: 4000.0000 [IU] | Freq: Once | INTRAMUSCULAR | Status: DC

## 2017-01-25 MED ORDER — HEPARIN BOLUS VIA INFUSION
3300.0000 [IU] | Freq: Once | INTRAVENOUS | Status: DC
  Filled 2017-01-25: qty 3300

## 2017-01-25 MED ORDER — EPOETIN ALFA 10000 UNIT/ML IJ SOLN
4000.0000 [IU] | INTRAMUSCULAR | Status: DC
  Administered 2017-01-25 – 2017-01-27 (×2): 4000 [IU] via INTRAVENOUS
  Filled 2017-01-25: qty 1

## 2017-01-25 MED ORDER — HEPARIN BOLUS VIA INFUSION
3000.0000 [IU] | Freq: Once | INTRAVENOUS | Status: AC
  Administered 2017-01-25: 3000 [IU] via INTRAVENOUS
  Filled 2017-01-25: qty 3000

## 2017-01-25 MED ORDER — HEPARIN SODIUM (PORCINE) 1000 UNIT/ML IJ SOLN
4000.0000 [IU] | Freq: Once | INTRAMUSCULAR | Status: DC
  Filled 2017-01-25: qty 1

## 2017-01-25 NOTE — Procedures (Signed)
Central Venous Dailysis Catheter Placement: DOUBLE LUMEN 20cm Indication: Hemo Dialysis/CRRT    Consent:emergent   Hand washing performed prior to starting the procedure.   Procedure: An active timeout was performed and correct patient, name, & ID confirmed. Patient was positioned correctly for central venous access. Patient was prepped using strict sterile technique including chlorohexadine preps, sterile drape, sterile gown and sterile gloves.  The area was prepped, draped and anesthetized in the usual sterile manner. Patient comfort was obtained.    A Double lumen catheter was placed in RT IJ  Vein There was good blood return, catheter caps were placed on lumens, catheter flushed easily, the line was secured and a sterile dressing and BIO-PATCH applied.   Ultrasound was used to visualize vasculature and guidance of needle.   Number of Attempts: 1 Complications:none  Estimated Blood Loss: none Operator: Destinae Neubecker.   Corrin Parker, M.D.  [facility]  Medical Director [place] Director [facility] Cardio-Pulmonary Department

## 2017-01-25 NOTE — Progress Notes (Signed)
NAD No new complaints  Vitals:   01/25/17 1000 01/25/17 1100 01/25/17 1200 01/25/17 1230  BP: 104/60 (!) 105/59 (!) 88/55   Pulse: 73 73  92  Resp: 15 17 (!) 23 (!) 22  Temp:      TempSrc:      SpO2: 99% 99%  98%  Weight:      Height:        Obese NAD HEENT WNL Chest clear without wheezes Regular, no M Soft, NABS Symmetric pretibial, ankle and pedal edema No focal neurologic deficits  BMP Latest Ref Rng & Units 01/25/2017 01/24/2017 01/23/2017  Glucose 65 - 99 mg/dL 159(H) 153(H) 182(H)  BUN 6 - 20 mg/dL 72(H) 58(H) 52(H)  Creatinine 0.61 - 1.24 mg/dL 6.61(H) 5.43(H) 4.92(H)  Sodium 135 - 145 mmol/L 129(L) 132(L) 132(L)  Potassium 3.5 - 5.1 mmol/L 3.7 3.9 4.3  Chloride 101 - 111 mmol/L 101 103 100(L)  CO2 22 - 32 mmol/L 17(L) 18(L) 20(L)  Calcium 8.9 - 10.3 mg/dL 8.0(L) 8.0(L) 8.5(L)    CBC Latest Ref Rng & Units 01/25/2017 01/24/2017 01/23/2017  WBC 3.8 - 10.6 K/uL 7.2 7.2 9.9  Hemoglobin 13.0 - 18.0 g/dL 8.8(L) 9.1(L) 9.9(L)  Hematocrit 40.0 - 52.0 % 26.4(L) 27.4(L) 30.2(L)  Platelets 150 - 440 K/uL 161 178 218    No new CXR  IMPRESSION: Hypotension, resolved Severe sepsis MSSA bacteremia Non-STEMI - on heparin infusion ESRD Removal of PermCath 12/03 OSA/OHS, chronic CPAP Type 2 diabetes  PLAN/REC: He will need a temporary HD catheter placed today Heparin put on hold in anticipation of placement Antibiotics per ID service Continue SSI Continue nocturnal CPAP Continue supplemental oxygen  If he tolerates dialysis without any hemodynamic issues, he may be transferred out of ICU/SDU unit today  Merton Border, MD PCCM service Mobile [phone] Pager [phone] 01/25/2017 2:04 PM

## 2017-01-25 NOTE — Progress Notes (Signed)
[place] INFECTIOUS DISEASE PROGRESS NOTE Date of Admission:  01/23/2017     ID: Brian [name] is a 56 y.o. male with MSSA bacteremia Active Problems:   NSTEMI (non-ST elevated myocardial infarction) ([place])   Subjective: No fevers, had Permacath removed and placement temp dialysis in R neck.   ROS  Eleven systems are reviewed and negative except per hpi  Medications:  Antibiotics Given (last 72 hours)    Date/Time Action Medication Dose Rate   01/23/17 1246 New Bag/Given   piperacillin-tazobactam (ZOSYN) IVPB 3.375 g 3.375 g 100 mL/hr   01/23/17 1246 New Bag/Given   vancomycin (VANCOCIN) IVPB 1000 mg/200 mL premix 1,000 mg 200 mL/hr   01/23/17 1652 New Bag/Given   vancomycin (VANCOCIN) 1,250 mg in sodium chloride 0.9 % 250 mL IVPB 1,250 mg 166.7 mL/hr   01/23/17 1715 New Bag/Given   piperacillin-tazobactam (ZOSYN) IVPB 3.375 g 3.375 g 12.5 mL/hr   01/24/17 0542 New Bag/Given   piperacillin-tazobactam (ZOSYN) IVPB 3.375 g 3.375 g 12.5 mL/hr   01/24/17 1705 New Bag/Given   ceFAZolin (ANCEF) 2 g in dextrose 5 % 100 mL injection     01/25/17 0525 New Bag/Given   ceFAZolin (ANCEF) 2 g in dextrose 5 % 100 mL injection       . aspirin EC  81 mg Oral Daily  . atorvastatin  80 mg Oral Daily  . epoetin (EPOGEN/PROCRIT) injection  4,000 Units Intravenous Q T,Th,Sa-HD  . gabapentin  100 mg Oral QHS  . insulin aspart  0-5 Units Subcutaneous QHS  . insulin aspart  0-9 Units Subcutaneous TID WC  . levothyroxine  50 mcg Oral QAC breakfast  . sertraline  25 mg Oral Daily  . sodium chloride flush  10-40 mL Intracatheter Q12H    Objective: Vital signs in last 24 hours: Temp:  [98.9 F (37.2 C)-100.2 F (37.9 C)] 98.9 F (37.2 C) (12/04 1530) Pulse Rate:  [73-101] 97 (12/04 1615) Resp:  [15-27] 19 (12/04 1615) BP: (88-135)/(53-82) 116/66 (12/04 1615) SpO2:  [90 %-100 %] 97 % (12/04 1615) Weight:  [149.5 kg (329 lb 9.4 oz)] 149.5 kg (329 lb 9.4 oz) (12/04  1530) Constitutional: He is oriented to person, place, and time. Morbidly obese HENT: anicteric Mouth/Throat: Oropharynx is clear and moist. No oropharyngeal exudate.  Cardiovascular: very distant Pulmonary/Chest: Effort normal and breath sounds normal. No respiratory distress. He has no wheezes.  Abdominal: Soft. Bowel sounds are normal. He exhibits no distension. There is no tenderness.  Lymphadenopathy: He has no cervical adenopathy.  Neurological: He is alert and oriented to person, place, and time.  Ext 1+ bil edema. L foot missing 3rd toe Skin: Skin is warm and dry. LLE with a few scaly lesions Psychiatric: He has a normal mood and affect. His behavior is normal.  Access-  R permacath site covered after removal   Lab Results Recent Labs    01/24/17 0253 01/25/17 0517  WBC 7.2 7.2  HGB 9.1* 8.8*  HCT 27.4* 26.4*  NA 132* 129*  K 3.9 3.7  CL 103 101  CO2 18* 17*  BUN 58* 72*  CREATININE 5.43* 6.61*    Microbiology: Results for orders placed or performed during the hospital encounter of 01/23/17  CULTURE, BLOOD (ROUTINE X 2) w Reflex to ID Panel     Status: Abnormal (Preliminary result)   Collection Time: 01/23/17 11:44 AM  Result Value Ref Range Status   Specimen Description BLOOD RIGHT ANTECUBITAL  Final   Special Requests  Final    BOTTLES DRAWN AEROBIC AND ANAEROBIC Blood Culture results may not be optimal due to an excessive volume of blood received in culture bottles   Culture  Setup Time   Final    GRAM POSITIVE COCCI IN BOTH AEROBIC AND ANAEROBIC BOTTLES CRITICAL RESULT CALLED TO, READ BACK BY AND VERIFIED WITH: MATT MCBANE AT 97989 ON 01/24/17 [place].    Culture (A)  Final    STAPHYLOCOCCUS AUREUS SUSCEPTIBILITIES TO FOLLOW Performed at [facility], [place] [address], [place], [place] [address]    Report Status PENDING  Incomplete  CULTURE, BLOOD (ROUTINE X 2) w Reflex to ID Panel     Status: Abnormal (Preliminary result)   Collection Time: 01/23/17  11:44 AM  Result Value Ref Range Status   Specimen Description BLOOD BLOOD LEFT FOREARM  Final   Special Requests   Final    BOTTLES DRAWN AEROBIC AND ANAEROBIC Blood Culture results may not be optimal due to an excessive volume of blood received in culture bottles   Culture  Setup Time   Final    GRAM POSITIVE COCCI IN BOTH AEROBIC AND ANAEROBIC BOTTLES CRITICAL VALUE NOTED.  VALUE IS CONSISTENT WITH PREVIOUSLY REPORTED AND CALLED VALUE.    Culture STAPHYLOCOCCUS AUREUS (A)  Final   Report Status PENDING  Incomplete  Blood Culture ID Panel (Reflexed)     Status: Abnormal   Collection Time: 01/23/17 11:44 AM  Result Value Ref Range Status   Enterococcus species NOT DETECTED NOT DETECTED Final   Listeria monocytogenes NOT DETECTED NOT DETECTED Final   Staphylococcus species DETECTED (A) NOT DETECTED Final    Comment: CRITICAL RESULT CALLED TO, READ BACK BY AND VERIFIED WITH: MATT MCBANE AT 0700 ON 01/24/17 [place].    Staphylococcus aureus DETECTED (A) NOT DETECTED Final    Comment: Methicillin (oxacillin) susceptible Staphylococcus aureus (MSSA). Preferred therapy is anti staphylococcal beta lactam antibiotic (Cefazolin or Nafcillin), unless clinically contraindicated. CRITICAL RESULT CALLED TO, READ BACK BY AND VERIFIED WITH: MATT MCBANE AT 0700 ON 01/24/17 [place].    Methicillin resistance NOT DETECTED NOT DETECTED Final   Streptococcus species NOT DETECTED NOT DETECTED Final   Streptococcus agalactiae NOT DETECTED NOT DETECTED Final   Streptococcus pneumoniae NOT DETECTED NOT DETECTED Final   Streptococcus pyogenes NOT DETECTED NOT DETECTED Final   Acinetobacter baumannii NOT DETECTED NOT DETECTED Final   Enterobacteriaceae species NOT DETECTED NOT DETECTED Final   Enterobacter cloacae complex NOT DETECTED NOT DETECTED Final   Escherichia coli NOT DETECTED NOT DETECTED Final   Klebsiella oxytoca NOT DETECTED NOT DETECTED Final   Klebsiella pneumoniae NOT DETECTED NOT DETECTED Final    Proteus species NOT DETECTED NOT DETECTED Final   Serratia marcescens NOT DETECTED NOT DETECTED Final   Haemophilus influenzae NOT DETECTED NOT DETECTED Final   Neisseria meningitidis NOT DETECTED NOT DETECTED Final   Pseudomonas aeruginosa NOT DETECTED NOT DETECTED Final   Candida albicans NOT DETECTED NOT DETECTED Final   Candida glabrata NOT DETECTED NOT DETECTED Final   Candida krusei NOT DETECTED NOT DETECTED Final   Candida parapsilosis NOT DETECTED NOT DETECTED Final   Candida tropicalis NOT DETECTED NOT DETECTED Final  C difficile quick scan w PCR reflex     Status: None   Collection Time: 01/23/17  1:24 PM  Result Value Ref Range Status   C Diff antigen NEGATIVE NEGATIVE Final   C Diff toxin NEGATIVE NEGATIVE Final   C Diff interpretation No C. difficile detected.  Final  MRSA  PCR Screening     Status: None   Collection Time: 01/23/17  2:57 PM  Result Value Ref Range Status   MRSA by PCR NEGATIVE NEGATIVE Final    Comment:        The GeneXpert MRSA Assay (FDA approved for NASAL specimens only), is one component of a comprehensive MRSA colonization surveillance program. It is not intended to diagnose MRSA infection nor to guide or monitor treatment for MRSA infections.   Culture, blood (single) w Reflex to ID Panel     Status: None (Preliminary result)   Collection Time: 01/24/17  4:34 PM  Result Value Ref Range Status   Specimen Description BLOOD BLOOD RIGHT HAND  Final   Special Requests   Final    BOTTLES DRAWN AEROBIC AND ANAEROBIC Blood Culture adequate volume   Culture NO GROWTH < 24 HOURS  Final   Report Status PENDING  Incomplete  Cath Tip Culture     Status: None (Preliminary result)   Collection Time: 01/24/17  4:42 PM  Result Value Ref Range Status   Specimen Description CATH TIP  Final   Special Requests NONE  Final   Culture   Final    >100,000 CFU STAPHYLOCOCCUS AUREUS SUSCEPTIBILITIES TO FOLLOW Performed at [facility], [place] [address] [place], [place] [address]    Report Status PENDING  Incomplete  Gastrointestinal Panel by PCR , Stool     Status: Abnormal   Collection Time: 01/24/17  9:23 PM  Result Value Ref Range Status   Campylobacter species NOT DETECTED NOT DETECTED Final   Plesimonas shigelloides NOT DETECTED NOT DETECTED Final   Salmonella species NOT DETECTED NOT DETECTED Final   Yersinia enterocolitica NOT DETECTED NOT DETECTED Final   Vibrio species NOT DETECTED NOT DETECTED Final   Vibrio cholerae NOT DETECTED NOT DETECTED Final   Enteroaggregative E coli (EAEC) NOT DETECTED NOT DETECTED Final   Enteropathogenic E coli (EPEC) DETECTED (A) NOT DETECTED Final    Comment: RESULT CALLED TO, READ BACK BY AND VERIFIED WITH: CHELSEA WRENN AT 0011 01/25/17 [place]    Enterotoxigenic E coli (ETEC) NOT DETECTED NOT DETECTED Final   Shiga like toxin producing E coli (STEC) NOT DETECTED NOT DETECTED Final   Shigella/Enteroinvasive E coli (EIEC) NOT DETECTED NOT DETECTED Final   Cryptosporidium NOT DETECTED NOT DETECTED Final   Cyclospora cayetanensis NOT DETECTED NOT DETECTED Final   Entamoeba histolytica NOT DETECTED NOT DETECTED Final   Giardia lamblia NOT DETECTED NOT DETECTED Final   Adenovirus F40/41 NOT DETECTED NOT DETECTED Final   Astrovirus NOT DETECTED NOT DETECTED Final   Norovirus GI/GII NOT DETECTED NOT DETECTED Final   Rotavirus A NOT DETECTED NOT DETECTED Final   Sapovirus (I, II, IV, and V) NOT DETECTED NOT DETECTED Final    Studies/Results: No results found.  Assessment/Plan: TADEUSZ STAHL is a 56 y.o. male with Morbid obesity, CHF, ESRD on HD recently through R permacath now with MSSA bacteremia, sepsis and NSTEMI.  He has no evidence of metastatic infection- does have baseline back pain but no worsening. He does not have a PPM.  He had removal  HD cath 12/3 - cath tip +, repeat bcx ngtd from 12/3. Echo pending  Recommendations Cont  ancef. Pending echo - if stable can consider TEE if TTE neg  for vegetations.  Min 2 weeks IV abx will be needed.   Thank you very much for the consult. Will follow with you.  Leonel Ramsay  01/25/2017, 4:29 PM

## 2017-01-25 NOTE — Progress Notes (Signed)
ANTIBIOTIC CONSULT NOTE - INITIAL  Pharmacy Consult for cefazolin Indication: sepsis  Allergies  Allergen Reactions  . Naproxen Rash    Patient Measurements: Height: 5\' 10"  (177.8 cm) Weight: (!) 330 lb 0.5 oz (149.7 kg) IBW/kg (Calculated) : 73 Adjusted Body Weight: 101.8 kg  Vital Signs: BP: 88/55 (12/04 1200) Pulse Rate: 92 (12/04 1230) Intake/Output from previous day: 12/03 0701 - 12/04 0700 In: 652.3 [I.V.:652.3] Out: 700 [Stool:700] Intake/Output from this shift: Total I/O In: -  Out: 20 [Stool:20]  Labs: Recent Labs    01/23/17 1144 01/24/17 0253 01/25/17 0517  WBC 9.9 7.2 7.2  HGB 9.9* 9.1* 8.8*  PLT 218 178 161  CREATININE 4.92* 5.43* 6.61*   Estimated Creatinine Clearance: 18.1 mL/min (A) (by C-G formula based on SCr of 6.61 mg/dL (H)). No results for input(s): VANCOTROUGH, VANCOPEAK, VANCORANDOM, GENTTROUGH, GENTPEAK, GENTRANDOM, TOBRATROUGH, TOBRAPEAK, TOBRARND, AMIKACINPEAK, AMIKACINTROU, AMIKACIN in the last 72 hours.   Microbiology: Recent Results (from the past 720 hour(s))  MRSA PCR Screening     Status: None   Collection Time: 12/30/16 12:36 AM  Result Value Ref Range Status   MRSA by PCR NEGATIVE NEGATIVE Final    Comment:        The GeneXpert MRSA Assay (FDA approved for NASAL specimens only), is one component of a comprehensive MRSA colonization surveillance program. It is not intended to diagnose MRSA infection nor to guide or monitor treatment for MRSA infections.   CULTURE, BLOOD (ROUTINE X 2) w Reflex to ID Panel     Status: Abnormal (Preliminary result)   Collection Time: 01/23/17 11:44 AM  Result Value Ref Range Status   Specimen Description BLOOD RIGHT ANTECUBITAL  Final   Special Requests   Final    BOTTLES DRAWN AEROBIC AND ANAEROBIC Blood Culture results may not be optimal due to an excessive volume of blood received in culture bottles   Culture  Setup Time   Final    GRAM POSITIVE COCCI IN BOTH AEROBIC AND ANAEROBIC  BOTTLES CRITICAL RESULT CALLED TO, READ BACK BY AND VERIFIED WITH: MATT MCBANE [place] [id number] ON 01/24/17 [place].    Culture (A)  Final    STAPHYLOCOCCUS AUREUS SUSCEPTIBILITIES TO FOLLOW Performed at [facility], [place] [address] [place], [place] [address]    Report Status PENDING  Incomplete  CULTURE, BLOOD (ROUTINE X 2) w Reflex to ID Panel     Status: Abnormal (Preliminary result)   Collection Time: 01/23/17 11:44 AM  Result Value Ref Range Status   Specimen Description BLOOD BLOOD LEFT FOREARM  Final   Special Requests   Final    BOTTLES DRAWN AEROBIC AND ANAEROBIC Blood Culture results may not be optimal due to an excessive volume of blood received in culture bottles   Culture  Setup Time   Final    GRAM POSITIVE COCCI IN BOTH AEROBIC AND ANAEROBIC BOTTLES CRITICAL VALUE NOTED.  VALUE IS CONSISTENT WITH PREVIOUSLY REPORTED AND CALLED VALUE.    Culture STAPHYLOCOCCUS AUREUS (A)  Final   Report Status PENDING  Incomplete  Blood Culture ID Panel (Reflexed)     Status: Abnormal   Collection Time: 01/23/17 11:44 AM  Result Value Ref Range Status   Enterococcus species NOT DETECTED NOT DETECTED Final   Listeria monocytogenes NOT DETECTED NOT DETECTED Final   Staphylococcus species DETECTED (A) NOT DETECTED Final    Comment: CRITICAL RESULT CALLED TO, READ BACK BY AND VERIFIED WITH: MATT MCBANE AT 0700 ON 01/24/17 [place].    Staphylococcus  aureus DETECTED (A) NOT DETECTED Final    Comment: Methicillin (oxacillin) susceptible Staphylococcus aureus (MSSA). Preferred therapy is anti staphylococcal beta lactam antibiotic (Cefazolin or Nafcillin), unless clinically contraindicated. CRITICAL RESULT CALLED TO, READ BACK BY AND VERIFIED WITH: MATT MCBANE AT 0700 ON 01/24/17 [place].    Methicillin resistance NOT DETECTED NOT DETECTED Final   Streptococcus species NOT DETECTED NOT DETECTED Final   Streptococcus agalactiae NOT DETECTED NOT DETECTED Final   Streptococcus pneumoniae NOT DETECTED NOT  DETECTED Final   Streptococcus pyogenes NOT DETECTED NOT DETECTED Final   Acinetobacter baumannii NOT DETECTED NOT DETECTED Final   Enterobacteriaceae species NOT DETECTED NOT DETECTED Final   Enterobacter cloacae complex NOT DETECTED NOT DETECTED Final   Escherichia coli NOT DETECTED NOT DETECTED Final   Klebsiella oxytoca NOT DETECTED NOT DETECTED Final   Klebsiella pneumoniae NOT DETECTED NOT DETECTED Final   Proteus species NOT DETECTED NOT DETECTED Final   Serratia marcescens NOT DETECTED NOT DETECTED Final   Haemophilus influenzae NOT DETECTED NOT DETECTED Final   Neisseria meningitidis NOT DETECTED NOT DETECTED Final   Pseudomonas aeruginosa NOT DETECTED NOT DETECTED Final   Candida albicans NOT DETECTED NOT DETECTED Final   Candida glabrata NOT DETECTED NOT DETECTED Final   Candida krusei NOT DETECTED NOT DETECTED Final   Candida parapsilosis NOT DETECTED NOT DETECTED Final   Candida tropicalis NOT DETECTED NOT DETECTED Final  C difficile quick scan w PCR reflex     Status: None   Collection Time: 01/23/17  1:24 PM  Result Value Ref Range Status   C Diff antigen NEGATIVE NEGATIVE Final   C Diff toxin NEGATIVE NEGATIVE Final   C Diff interpretation No C. difficile detected.  Final  MRSA PCR Screening     Status: None   Collection Time: 01/23/17  2:57 PM  Result Value Ref Range Status   MRSA by PCR NEGATIVE NEGATIVE Final    Comment:        The GeneXpert MRSA Assay (FDA approved for NASAL specimens only), is one component of a comprehensive MRSA colonization surveillance program. It is not intended to diagnose MRSA infection nor to guide or monitor treatment for MRSA infections.   Culture, blood (single) w Reflex to ID Panel     Status: None (Preliminary result)   Collection Time: 01/24/17  4:34 PM  Result Value Ref Range Status   Specimen Description BLOOD BLOOD RIGHT HAND  Final   Special Requests   Final    BOTTLES DRAWN AEROBIC AND ANAEROBIC Blood Culture  adequate volume   Culture NO GROWTH < 24 HOURS  Final   Report Status PENDING  Incomplete  Cath Tip Culture     Status: None (Preliminary result)   Collection Time: 01/24/17  4:42 PM  Result Value Ref Range Status   Specimen Description CATH TIP  Final   Special Requests NONE  Final   Culture   Final    >100,000 CFU STAPHYLOCOCCUS AUREUS SUSCEPTIBILITIES TO FOLLOW Performed at [facility], [place] [address] [place], [place] [address]    Report Status PENDING  Incomplete  Gastrointestinal Panel by PCR , Stool     Status: Abnormal   Collection Time: 01/24/17  9:23 PM  Result Value Ref Range Status   Campylobacter species NOT DETECTED NOT DETECTED Final   Plesimonas shigelloides NOT DETECTED NOT DETECTED Final   Salmonella species NOT DETECTED NOT DETECTED Final   Yersinia enterocolitica NOT DETECTED NOT DETECTED Final   Vibrio species NOT DETECTED  NOT DETECTED Final   Vibrio cholerae NOT DETECTED NOT DETECTED Final   Enteroaggregative E coli (EAEC) NOT DETECTED NOT DETECTED Final   Enteropathogenic E coli (EPEC) DETECTED (A) NOT DETECTED Final    Comment: RESULT CALLED TO, READ BACK BY AND VERIFIED WITH: CHELSEA WRENN AT 0011 01/25/17 [place]    Enterotoxigenic E coli (ETEC) NOT DETECTED NOT DETECTED Final   Shiga like toxin producing E coli (STEC) NOT DETECTED NOT DETECTED Final   Shigella/Enteroinvasive E coli (EIEC) NOT DETECTED NOT DETECTED Final   Cryptosporidium NOT DETECTED NOT DETECTED Final   Cyclospora cayetanensis NOT DETECTED NOT DETECTED Final   Entamoeba histolytica NOT DETECTED NOT DETECTED Final   Giardia lamblia NOT DETECTED NOT DETECTED Final   Adenovirus F40/41 NOT DETECTED NOT DETECTED Final   Astrovirus NOT DETECTED NOT DETECTED Final   Norovirus GI/GII NOT DETECTED NOT DETECTED Final   Rotavirus A NOT DETECTED NOT DETECTED Final   Sapovirus (I, II, IV, and V) NOT DETECTED NOT DETECTED Final    Medical History: Past Medical History:  Diagnosis Date  .  CHF (congestive heart failure) ([place])   . COPD (chronic obstructive pulmonary disease) ([place])   . Diabetes mellitus type 2 in obese ([place])   . DM (diabetes mellitus) type II controlled with renal manifestation ([place])   . Hypercholesteremia   . Hypertension   . Morbid obesity with BMI of 45.0-49.9, adult ([place])   . Neuropathy   . Pancreatitis, acute   . Pneumonia   . Spinal stenosis     Medications:  Infusions:  . small volume/piggyback builder    . heparin Stopped (01/25/17 1120)   Assessment: 56 yom cc weakness/fall. Question PNA vs ADHF - stable on Bipap for admit to ICU. Pharmacy consulted to dose Zosyn and vancomycin for sepsis.   MSSA bacteremia  HD to begin 12/4 after vascath  Plan:  Transition to HD dosing of cefazolin 1 g iv q 24 hours.   Ulice Dash, PharmD Clinical Pharmacist  01/25/2017,2:25 PM

## 2017-01-25 NOTE — Progress Notes (Signed)
HD TX ended  

## 2017-01-25 NOTE — Progress Notes (Signed)
PRE HD   

## 2017-01-25 NOTE — Progress Notes (Signed)
Pt refused bipap at this time

## 2017-01-25 NOTE — Progress Notes (Signed)
HD started. 

## 2017-01-25 NOTE — Progress Notes (Signed)
[facility], [place] 01/25/17  Subjective:  Patient is doing fair Denies acute C/O Blood cultures are positive for staph aureus (MSSA) + LE edema Potassium 3.7 No complaints of shortness of breath.  Ate 100% of his breakfast Right IJ PermCath was removed yesterday Currently off of Levophed  Objective:  Vital signs in last 24 hours:  Temp:  [98.5 F (36.9 C)-100.2 F (37.9 C)] 100.2 F (37.9 C) (12/03 2000) Pulse Rate:  [71-99] 74 (12/04 0700) Resp:  [15-27] 18 (12/04 0700) BP: (96-135)/(53-82) 104/65 (12/04 0700) SpO2:  [90 %-100 %] 97 % (12/04 0700)  Weight change:  Filed Weights   01/23/17 1129 01/23/17 1434 01/24/17 0500  Weight: (!) 145.2 kg (320 lb) (!) 149.5 kg (329 lb 9.4 oz) (!) 149.7 kg (330 lb 0.5 oz)    Intake/Output:    Intake/Output Summary (Last 24 hours) at 01/25/2017 0902 Last data filed at 01/24/2017 2200 Gross per 24 hour  Intake 527.83 ml  Output 700 ml  Net -172.17 ml     Physical Exam: General:  Sitting up in chair on the side of the bed  HEENT Anicteric, moist mucus membranes  Neck Supple, left IJ CVL  Pulm/lungs  mild scattered rhonchi, otherwise clear  CVS/Heart regular  Abdomen:  Soft, NT,   Extremities: + dependent edema  Neurologic: Alert, oteinted     Access:        Basic Metabolic Panel:  Recent Labs  Lab 01/23/17 1144 01/23/17 2057 01/24/17 0253 01/25/17 0517  NA 132*  --  132* 129*  K 4.3  --  3.9 3.7  CL 100*  --  103 101  CO2 20*  --  18* 17*  GLUCOSE 182*  --  153* 159*  BUN 52*  --  58* 72*  CREATININE 4.92*  --  5.43* 6.61*  CALCIUM 8.5*  --  8.0* 8.0*  MG  --  1.7  --  2.0  PHOS  --  3.8  --  5.3*     CBC: Recent Labs  Lab 01/23/17 1144 01/24/17 0253 01/25/17 0517  WBC 9.9 7.2 7.2  NEUTROABS 8.5*  --   --   HGB 9.9* 9.1* 8.8*  HCT 30.2* 27.4* 26.4*  MCV 91.5 91.0 90.3  PLT 218 178 161      Lab Results  Component Value Date   HEPBSAG Negative 12/31/2016    HEPBSAB Non Reactive 12/31/2016      Microbiology:  Recent Results (from the past 240 hour(s))  CULTURE, BLOOD (ROUTINE X 2) w Reflex to ID Panel     Status: None (Preliminary result)   Collection Time: 01/23/17 11:44 AM  Result Value Ref Range Status   Specimen Description BLOOD RIGHT ANTECUBITAL  Final   Special Requests   Final    BOTTLES DRAWN AEROBIC AND ANAEROBIC Blood Culture results may not be optimal due to an excessive volume of blood received in culture bottles   Culture  Setup Time   Final    GRAM POSITIVE COCCI IN BOTH AEROBIC AND ANAEROBIC BOTTLES CRITICAL RESULT CALLED TO, READ BACK BY AND VERIFIED WITH: MATT MCBANE AT [place] ON 01/24/17 [place].    Culture GRAM POSITIVE COCCI  Final   Report Status PENDING  Incomplete  CULTURE, BLOOD (ROUTINE X 2) w Reflex to ID Panel     Status: None (Preliminary result)   Collection Time: 01/23/17 11:44 AM  Result Value Ref Range Status   Specimen Description BLOOD BLOOD LEFT FOREARM  Final  Special Requests   Final    BOTTLES DRAWN AEROBIC AND ANAEROBIC Blood Culture results may not be optimal due to an excessive volume of blood received in culture bottles   Culture  Setup Time   Final    GRAM POSITIVE COCCI IN BOTH AEROBIC AND ANAEROBIC BOTTLES CRITICAL VALUE NOTED.  VALUE IS CONSISTENT WITH PREVIOUSLY REPORTED AND CALLED VALUE.    Culture GRAM POSITIVE COCCI  Final   Report Status PENDING  Incomplete  Blood Culture ID Panel (Reflexed)     Status: Abnormal   Collection Time: 01/23/17 11:44 AM  Result Value Ref Range Status   Enterococcus species NOT DETECTED NOT DETECTED Final   Listeria monocytogenes NOT DETECTED NOT DETECTED Final   Staphylococcus species DETECTED (A) NOT DETECTED Final    Comment: CRITICAL RESULT CALLED TO, READ BACK BY AND VERIFIED WITH: MATT MCBANE AT 0700 ON 01/24/17 [place].    Staphylococcus aureus DETECTED (A) NOT DETECTED Final    Comment: Methicillin (oxacillin) susceptible Staphylococcus aureus  (MSSA). Preferred therapy is anti staphylococcal beta lactam antibiotic (Cefazolin or Nafcillin), unless clinically contraindicated. CRITICAL RESULT CALLED TO, READ BACK BY AND VERIFIED WITH: MATT MCBANE AT 0700 ON 01/24/17 [place].    Methicillin resistance NOT DETECTED NOT DETECTED Final   Streptococcus species NOT DETECTED NOT DETECTED Final   Streptococcus agalactiae NOT DETECTED NOT DETECTED Final   Streptococcus pneumoniae NOT DETECTED NOT DETECTED Final   Streptococcus pyogenes NOT DETECTED NOT DETECTED Final   Acinetobacter baumannii NOT DETECTED NOT DETECTED Final   Enterobacteriaceae species NOT DETECTED NOT DETECTED Final   Enterobacter cloacae complex NOT DETECTED NOT DETECTED Final   Escherichia coli NOT DETECTED NOT DETECTED Final   Klebsiella oxytoca NOT DETECTED NOT DETECTED Final   Klebsiella pneumoniae NOT DETECTED NOT DETECTED Final   Proteus species NOT DETECTED NOT DETECTED Final   Serratia marcescens NOT DETECTED NOT DETECTED Final   Haemophilus influenzae NOT DETECTED NOT DETECTED Final   Neisseria meningitidis NOT DETECTED NOT DETECTED Final   Pseudomonas aeruginosa NOT DETECTED NOT DETECTED Final   Candida albicans NOT DETECTED NOT DETECTED Final   Candida glabrata NOT DETECTED NOT DETECTED Final   Candida krusei NOT DETECTED NOT DETECTED Final   Candida parapsilosis NOT DETECTED NOT DETECTED Final   Candida tropicalis NOT DETECTED NOT DETECTED Final  C difficile quick scan w PCR reflex     Status: None   Collection Time: 01/23/17  1:24 PM  Result Value Ref Range Status   C Diff antigen NEGATIVE NEGATIVE Final   C Diff toxin NEGATIVE NEGATIVE Final   C Diff interpretation No C. difficile detected.  Final  MRSA PCR Screening     Status: None   Collection Time: 01/23/17  2:57 PM  Result Value Ref Range Status   MRSA by PCR NEGATIVE NEGATIVE Final    Comment:        The GeneXpert MRSA Assay (FDA approved for NASAL specimens only), is one component of  a comprehensive MRSA colonization surveillance program. It is not intended to diagnose MRSA infection nor to guide or monitor treatment for MRSA infections.   Culture, blood (single) w Reflex to ID Panel     Status: None (Preliminary result)   Collection Time: 01/24/17  4:34 PM  Result Value Ref Range Status   Specimen Description BLOOD BLOOD RIGHT HAND  Final   Special Requests   Final    BOTTLES DRAWN AEROBIC AND ANAEROBIC Blood Culture adequate volume   Culture NO GROWTH <  4 HOURS  Final   Report Status PENDING  Incomplete  Gastrointestinal Panel by PCR , Stool     Status: Abnormal   Collection Time: 01/24/17  9:23 PM  Result Value Ref Range Status   Campylobacter species NOT DETECTED NOT DETECTED Final   Plesimonas shigelloides NOT DETECTED NOT DETECTED Final   Salmonella species NOT DETECTED NOT DETECTED Final   Yersinia enterocolitica NOT DETECTED NOT DETECTED Final   Vibrio species NOT DETECTED NOT DETECTED Final   Vibrio cholerae NOT DETECTED NOT DETECTED Final   Enteroaggregative E coli (EAEC) NOT DETECTED NOT DETECTED Final   Enteropathogenic E coli (EPEC) DETECTED (A) NOT DETECTED Final    Comment: RESULT CALLED TO, READ BACK BY AND VERIFIED WITH: CHELSEA WRENN AT 0011 01/25/17 [place]    Enterotoxigenic E coli (ETEC) NOT DETECTED NOT DETECTED Final   Shiga like toxin producing E coli (STEC) NOT DETECTED NOT DETECTED Final   Shigella/Enteroinvasive E coli (EIEC) NOT DETECTED NOT DETECTED Final   Cryptosporidium NOT DETECTED NOT DETECTED Final   Cyclospora cayetanensis NOT DETECTED NOT DETECTED Final   Entamoeba histolytica NOT DETECTED NOT DETECTED Final   Giardia lamblia NOT DETECTED NOT DETECTED Final   Adenovirus F40/41 NOT DETECTED NOT DETECTED Final   Astrovirus NOT DETECTED NOT DETECTED Final   Norovirus GI/GII NOT DETECTED NOT DETECTED Final   Rotavirus A NOT DETECTED NOT DETECTED Final   Sapovirus (I, II, IV, and V) NOT DETECTED NOT DETECTED Final     Coagulation Studies: Recent Labs    01/23/17 1144  LABPROT 15.9*  INR 1.28    Urinalysis: No results for input(s): COLORURINE, LABSPEC, PHURINE, GLUCOSEU, HGBUR, BILIRUBINUR, KETONESUR, PROTEINUR, UROBILINOGEN, NITRITE, LEUKOCYTESUR in the last 72 hours.  Invalid input(s): APPERANCEUR    Imaging: Dg Chest 1 View  Result Date: 01/23/2017 CLINICAL DATA:  Central line placement. EXAM: CHEST 1 VIEW COMPARISON:  01/23/2017 and prior radiographs FINDINGS: A left IJ central venous catheter is now noted with tip overlying upper SVC. A right IJ central venous catheter is again noted with tip overlying the mid SVC. Cardiomegaly and pulmonary vascular congestion again identified. Slight decrease in interstitial opacities/edema noted. There is no evidence of pneumothorax. IMPRESSION: Left IJ central venous catheter placement with tip overlying the upper SVC. No evidence of pneumothorax. Slight decrease in interstitial opacities/edema. Cardiomegaly and pulmonary vascular congestion. Electronically Signed   By: Margarette Canada M.D.   On: 01/23/2017 16:26   Dg Chest Port 1 View  Result Date: 01/23/2017 CLINICAL DATA:  Cough, fever EXAM: PORTABLE CHEST 1 VIEW COMPARISON:  12/31/2016 FINDINGS: Right dialysis catheter in place with the tip at the cavoatrial junction. Cardiomegaly with diffuse interstitial opacities likely edema. No visible effusions or acute bony abnormality. IMPRESSION: Cardiomegaly. Diffuse interstitial opacities in the mid and lower lungs, likely edema/ CHF. Electronically Signed   By: Rolm Baptise M.D.   On: 01/23/2017 12:27     Medications:   . small volume/piggyback builder    . heparin 2,000 Units/hr (01/25/17 0246)  . norepinephrine (LEVOPHED) Adult infusion Stopped (01/24/17 1707)   . aspirin EC  81 mg Oral Daily  . atorvastatin  80 mg Oral Daily  . epoetin (EPOGEN/PROCRIT) injection  4,000 Units Intravenous Q T,Th,Sa-HD  . gabapentin  100 mg Oral QHS  . insulin aspart   0-5 Units Subcutaneous QHS  . insulin aspart  0-9 Units Subcutaneous TID WC  . levothyroxine  50 mcg Oral QAC breakfast  . sertraline  25 mg Oral Daily  .  sodium chloride flush  10-40 mL Intracatheter Q12H   acetaminophen, albuterol, nitroGLYCERIN, ondansetron (ZOFRAN) IV, sodium chloride flush  Assessment/ Plan:  56 y.o. caucasian male with end stage renal disease on hemodialysis, diabetes mellitus type II insulin dependent, diabetic peripheral neuropathy, right toe amputation, hypertension, hyperlipidemia, COPD/tobacco use, depression, coronary artery disease  TTS [facility] RIJ permcath  1. ESRD 2, Sepsis, Staph aureus (MSSA) 3. Acute coronary syndrome/ NSTEMI/ Seen by Dr Ubaldo Glassing 4. Edema 5. AoCKD  Plan: Tunnel dialysis catheter has been removed.  Temporary dialysis catheter will be placed by ICU team  Antibiotics as per ICU team; currently getting cefazolin Plan for dialysis today once temporary dialysis cathter is available. EPO with HD On Heparin drip for ACS, [facility] cardiology following Plan for 2D echo, heart catheterization once sepsis is resolved   LOS: 2 [facility] 12/4/20189:02 [place], [place]

## 2017-01-25 NOTE — Progress Notes (Signed)
Pre hd assessment  

## 2017-01-25 NOTE — Consult Note (Signed)
ANTICOAGULATION CONSULT NOTE - Initial Consult  Pharmacy Consult for heparin drip Indication: chest pain/ACS  Allergies  Allergen Reactions  . Naproxen Rash    Patient Measurements: Height: 5\' 10"  (177.8 cm) Weight: (!) 330 lb 0.5 oz (149.7 kg) IBW/kg (Calculated) : 73 Heparin Dosing Weight: 107.4kg  Vital Signs: Temp: 100.2 F (37.9 C) (12/03 2000) Temp Source: Oral (12/03 2000) BP: 132/78 (12/03 2200) Pulse Rate: 87 (12/03 2200)  Labs: Recent Labs    01/23/17 1144  01/23/17 2057 01/24/17 0253 01/24/17 0921 01/24/17 1502 01/24/17 2359  HGB 9.9*  --   --  9.1*  --   --   --   HCT 30.2*  --   --  27.4*  --   --   --   PLT 218  --   --  178  --   --   --   APTT 37*  --   --   --   --   --   --   LABPROT 15.9*  --   --   --   --   --   --   INR 1.28  --   --   --   --   --   --   HEPARINUNFRC  --   --  <0.10*  --  0.22*  --  0.18*  CREATININE 4.92*  --   --  5.43*  --   --   --   TROPONINI >65.00*   < > 53.26* 52.89* 36.73* 30.22* 19.35*   < > = values in this interval not displayed.    Estimated Creatinine Clearance: 22 mL/min (A) (by C-G formula based on SCr of 5.43 mg/dL (H)).   Medical History: Past Medical History:  Diagnosis Date  . CHF (congestive heart failure) ([place])   . COPD (chronic obstructive pulmonary disease) ([place])   . Diabetes mellitus type 2 in obese ([place])   . DM (diabetes mellitus) type II controlled with renal manifestation ([place])   . Hypercholesteremia   . Hypertension   . Morbid obesity with BMI of 45.0-49.9, adult ([place])   . Neuropathy   . Pancreatitis, acute   . Pneumonia   . Spinal stenosis     Medications:  Scheduled:  . aspirin EC  81 mg Oral Daily  . atorvastatin  80 mg Oral Daily  . gabapentin  100 mg Oral QHS  . heparin  3,000 Units Intravenous Once  . insulin aspart  0-5 Units Subcutaneous QHS  . insulin aspart  0-9 Units Subcutaneous TID WC  . levothyroxine  50 mcg Oral QAC breakfast  . sertraline  25 mg Oral Daily  .  sodium chloride flush  10-40 mL Intracatheter Q12H    Assessment: Pt is a 56 year old obese male who was brought in by EMS with generalized weakness, hypotension. Pt found to have a troponin of >65. Pharmacy consulted to dose heparin drip with no bolus- per Dr. Margaretmary Eddy.   Goal of Therapy:  Heparin level 0.3-0.7 units/ml Monitor platelets by anticoagulation protocol: Yes   Plan:  Will bolus heparin 1600 units iv once and increase infusion to 1850 units/hr. Will recheck HL in 8 hours.   12/03 @ 2330 HL 0.18 subtherapeutic. Will rebolus w/ heparin 3000 units IV x 1 and increase rate to 2000 units/hr and will recheck HL and CBC @ 0530 am.  Brian [name], PharmD, BCPS Clinical Pharmacist 01/25/2017

## 2017-01-25 NOTE — Progress Notes (Signed)
Post HD  

## 2017-01-25 NOTE — Progress Notes (Signed)
[facility] [place] NAME: Brian [name]    MR#:  [id number]  DATE OF BIRTH:  1960/12/06  SUBJECTIVE:  CHIEF COMPLAINT:   Chief Complaint  Patient presents with  . Weakness   Generalized weakness and better diarrhea. BP is low side. REVIEW OF SYSTEMS:  Review of Systems  Constitutional: Positive for malaise/fatigue. Negative for chills and fever.  HENT: Negative for sore throat.   Eyes: Negative for blurred vision and double vision.  Respiratory: Negative for cough, hemoptysis, shortness of breath, wheezing and stridor.   Cardiovascular: Negative for chest pain, palpitations, orthopnea and leg swelling.  Gastrointestinal: Positive for diarrhea. Negative for abdominal pain, blood in stool, melena, nausea and vomiting.  Genitourinary: Negative for dysuria, flank pain and hematuria.  Musculoskeletal: Negative for back pain and joint pain.  Neurological: Positive for weakness. Negative for dizziness, sensory change, focal weakness, seizures, loss of consciousness and headaches.  Endo/Heme/Allergies: Negative for polydipsia.  Psychiatric/Behavioral: Negative for depression. The patient is not nervous/anxious.     DRUG ALLERGIES:   Allergies  Allergen Reactions  . Naproxen Rash   VITALS:  Blood pressure (!) 88/55, pulse 92, temperature 100.2 F (37.9 C), temperature source Oral, resp. rate (!) 22, height 5\' 10"  (1.778 m), weight (!) 330 lb 0.5 oz (149.7 kg), SpO2 98 %. PHYSICAL EXAMINATION:  Physical Exam  Constitutional: He is oriented to person, place, and time and well-developed, well-nourished, and in no distress.  Morbid obese  HENT:  Head: Normocephalic.  Eyes: Conjunctivae and EOM are normal. Pupils are equal, round, and reactive to light. No scleral icterus.  Neck: Normal range of motion. Neck supple. No JVD present. No tracheal deviation present.  Cardiovascular: Normal rate, regular rhythm and normal heart sounds. Exam  reveals no gallop.  No murmur heard. Pulmonary/Chest: Effort normal and breath sounds normal. No respiratory distress. He has no wheezes. He has no rales.  Abdominal: Soft. Bowel sounds are normal. He exhibits no distension. There is no tenderness. There is no rebound.  Musculoskeletal: Normal range of motion. He exhibits no edema or tenderness.  Neurological: He is alert and oriented to person, place, and time. No cranial nerve deficit.  Skin: No rash noted. No erythema.  Psychiatric: Affect normal.   LABORATORY PANEL:  Male CBC Recent Labs  Lab 01/25/17 0517  WBC 7.2  HGB 8.8*  HCT 26.4*  PLT 161   ------------------------------------------------------------------------------------------------------------------ Chemistries  Recent Labs  Lab 01/23/17 1144  01/25/17 0517  NA 132*   < > 129*  K 4.3   < > 3.7  CL 100*   < > 101  CO2 20*   < > 17*  GLUCOSE 182*   < > 159*  BUN 52*   < > 72*  CREATININE 4.92*   < > 6.61*  CALCIUM 8.5*   < > 8.0*  MG  --    < > 2.0  AST 119*  --   --   ALT 30  --   --   ALKPHOS 68  --   --   BILITOT 0.4  --   --    < > = values in this interval not displayed.   RADIOLOGY:  No results found. ASSESSMENT AND PLAN:   Brian [name]  is a 56 y.o. male with a known history of COPD, chronic diastolic congestive heart failure, end-stage renal disease recently started on hemodialysis was just admitted to hospital on 12/29/2016 and discharged on 01/06/2017 with  CHF exacerbation is presenting to the ED with a chief complaint of generalized weakness. By the time he came into the hospital he is hypotensive. Patient's initial troponin is at 65 EKG with no ST elevations.  #Non-STEMI Continue heparin drip, ASA and atorvastatin, f/u echo. Per Dr. Ubaldo Glassing, consider cath when septic picture improves to evaluate anatomy. Not candidate for beta blockers and ace I given hemodynamics.   #Septic shock with MSSA bacteremia, possible due to permacath  infection. Off Levophed drip.  Blood cultures show positive staph aureus. He was on broad-spectrum IV antibiotic Zosyn and vancomycin.  Per Dr. Ola Spurr, change to ancef, Min 2 weeks IV abx will be needed. Check echo - if stable can consider TEE if TTE neg for vegetations.  Removed permcath.  Follow-up repeated blood culture. Infectious disease consult.   ESRD. Temporary dialysis cathter placed and dialysis today per Dr. Candiss Norse.  Hyponatremia.  Worsening, follow-up sodium level after dialysis  Diarrhea. Negative C. difficile test.  #Chronic diastolic congestive heart failure with fluid overload In view of hypotension nephrology is not considering hemodialysis at this time  #Chronic history of obstructive sleep apnea Patient is noncompliant with CPAP machine Continue oxygen via nasal cannula  Anemia of chronic disease.  Stable.  EPO with HD.  Discussed with Dr. Candiss Norse. All the records are reviewed and case discussed with Care Management/Social Worker. Management plans discussed with the patient, family and they are in agreement.  CODE STATUS: Full Code  TOTAL TIME TAKING CARE OF THIS PATIENT: 36 minutes.   More than 50% of the time was spent in counseling/coordination of care: YES  POSSIBLE D/C IN 2-3 DAYS, DEPENDING ON CLINICAL CONDITION.   Demetrios Loll M.D on 01/25/2017 at 3:35 PM  Between 7am to 6pm - Pager - [phone]  After 6pm go to [email or web address] - [facility]

## 2017-01-25 NOTE — Consult Note (Signed)
ANTICOAGULATION CONSULT NOTE - Initial Consult  Pharmacy Consult for heparin drip Indication: chest pain/ACS  Allergies  Allergen Reactions  . Naproxen Rash    Patient Measurements: Height: 5\' 10"  (177.8 cm) Weight: (!) 329 lb 9.4 oz (149.5 kg) IBW/kg (Calculated) : 73 Heparin Dosing Weight: 107.4kg  Vital Signs: Temp: 98.9 F (37.2 C) (12/04 1900) Temp Source: Oral (12/04 1900) BP: 107/63 (12/04 1900) Pulse Rate: 98 (12/04 1900)  Labs: Recent Labs    01/23/17 1144  01/24/17 0253 01/24/17 0921 01/24/17 1502 01/24/17 2359 01/25/17 0517 01/25/17 1119  HGB 9.9*  --  9.1*  --   --   --  8.8*  --   HCT 30.2*  --  27.4*  --   --   --  26.4*  --   PLT 218  --  178  --   --   --  161  --   APTT 37*  --   --   --   --   --   --   --   LABPROT 15.9*  --   --   --   --   --   --   --   INR 1.28  --   --   --   --   --   --   --   HEPARINUNFRC  --    < >  --  0.22*  --  0.18* 0.39 0.20*  CREATININE 4.92*  --  5.43*  --   --   --  6.61*  --   TROPONINI >65.00*   < > 52.89* 36.73* 30.22* 19.35*  --   --    < > = values in this interval not displayed.    Estimated Creatinine Clearance: 18.1 mL/min (A) (by C-G formula based on SCr of 6.61 mg/dL (H)).   Medical History: Past Medical History:  Diagnosis Date  . CHF (congestive heart failure) ([place])   . COPD (chronic obstructive pulmonary disease) ([place])   . Diabetes mellitus type 2 in obese ([place])   . DM (diabetes mellitus) type II controlled with renal manifestation ([place])   . Hypercholesteremia   . Hypertension   . Morbid obesity with BMI of 45.0-49.9, adult ([place])   . Neuropathy   . Pancreatitis, acute   . Pneumonia   . Spinal stenosis     Medications:  Scheduled:  . aspirin EC  81 mg Oral Daily  . atorvastatin  80 mg Oral Daily  . epoetin (EPOGEN/PROCRIT) injection  4,000 Units Intravenous Q T,Th,Sa-HD  . gabapentin  100 mg Oral QHS  . heparin  4,000 Units Intracatheter Once  . insulin aspart  0-5 Units  Subcutaneous QHS  . insulin aspart  0-9 Units Subcutaneous TID WC  . levothyroxine  50 mcg Oral QAC breakfast  . sertraline  25 mg Oral Daily  . sodium chloride flush  10-40 mL Intracatheter Q12H    Assessment: Pt is a 56 year old obese male who was brought in by EMS with generalized weakness, hypotension. Pt found to have a troponin of >65. Pharmacy consulted to dose heparin drip with no bolus- per Dr. Margaretmary Eddy.   Goal of Therapy:  Heparin level 0.3-0.7 units/ml Monitor platelets by anticoagulation protocol: Yes   Plan:  Heparin restarted around 1910 at 2000 units/hr. Will check level in about 6 hours.  Ramond Dial, Pharm.D, BCPS Clinical Pharmacist 01/25/2017

## 2017-01-25 NOTE — Progress Notes (Signed)
*  PRELIMINARY RESULTS* Echocardiogram 2D Echocardiogram has been performed.  Sherrie Sport 01/25/2017, 12:05 PM

## 2017-01-25 NOTE — Progress Notes (Signed)
[facility]  SUBJECTIVE: no chest pain. Some fatigue   Vitals:   01/25/17 0400 01/25/17 0500 01/25/17 0600 01/25/17 0700  BP: 114/67 (!) 111/54 (!) 96/55 104/65  Pulse: 89 90 74 74  Resp: (!) 25 (!) 27 (!) 22 18  Temp:      TempSrc:      SpO2: 92% 90% 97% 97%  Weight:      Height:        Intake/Output Summary (Last 24 hours) at 01/25/2017 0852 Last data filed at 01/24/2017 2200 Gross per 24 hour  Intake 581.83 ml  Output 700 ml  Net -118.17 ml    LABS: Basic Metabolic Panel: Recent Labs    01/23/17 2057 01/24/17 0253 01/25/17 0517  NA  --  132* 129*  K  --  3.9 3.7  CL  --  103 101  CO2  --  18* 17*  GLUCOSE  --  153* 159*  BUN  --  58* 72*  CREATININE  --  5.43* 6.61*  CALCIUM  --  8.0* 8.0*  MG 1.7  --  2.0  PHOS 3.8  --  5.3*   Liver Function Tests: Recent Labs    01/23/17 1144  AST 119*  ALT 30  ALKPHOS 68  BILITOT 0.4  PROT 7.3  ALBUMIN 3.0*   Recent Labs    01/23/17 1144  LIPASE 22   CBC: Recent Labs    01/23/17 1144 01/24/17 0253 01/25/17 0517  WBC 9.9 7.2 7.2  NEUTROABS 8.5*  --   --   HGB 9.9* 9.1* 8.8*  HCT 30.2* 27.4* 26.4*  MCV 91.5 91.0 90.3  PLT 218 178 161   Cardiac Enzymes: Recent Labs    01/24/17 0921 01/24/17 1502 01/24/17 2359  TROPONINI 36.73* 30.22* 19.35*   BNP: Invalid input(s): POCBNP D-Dimer: No results for input(s): DDIMER in the last 72 hours. Hemoglobin A1C: No results for input(s): HGBA1C in the last 72 hours. Fasting Lipid Panel: Recent Labs    01/24/17 0253  CHOL 83  HDL 26*  LDLCALC 25  TRIG 160*  CHOLHDL 3.2   Thyroid Function Tests: Recent Labs    01/24/17 0253  TSH 1.784   Anemia Panel: No results for input(s): VITAMINB12, FOLATE, FERRITIN, TIBC, IRON, RETICCTPCT in the last 72 hours.   Physical Exam: Blood pressure 104/65, pulse 74, temperature 100.2 F (37.9 C), temperature source Oral, resp. rate 18, height 5\' 10"  (1.778  m), weight (!) 149.7 kg (330 lb 0.5 oz), SpO2 97 %.   Wt Readings from Last 1 Encounters:  01/24/17 (!) 149.7 kg (330 lb 0.5 oz)     General appearance: alert and cooperative Resp: diminished breath sounds bibasilar Cardio: regular rate and rhythm GI: soft, non-tender; bowel sounds normal; no masses,  no organomegaly Extremities: edema 2+ edema Neurologic: Grossly normal  TELEMETRY: Reviewed telemetry pt in sinus tach:  ASSESSMENT AND PLAN:  Active Problems:   NSTEMI (non-ST elevated myocardial infarction) (HCC)-elevated troponin. Blood pressure is improved but still fairly low. Will need consideration for cardiac cath when sepsis is improved. Has staph in blood cultures and perma cath removed. Awaiting echo to determine if there is a change in his wall motion as welll as for evidence of vegetations. If echo negative for vegatations, will need to consider tee.Will also need consideration for hd. Will continue with heparin for now. Not candidate for metoprolol due to hypotension.   [name], MD, [facility]  01/25/2017 8:52 AM

## 2017-01-25 NOTE — Care Management (Signed)
Readmit for stemi with troponin of 53.26. Patient also diagnosed with sepsis / bacteremia - source perm cath which has been removed and a temporary dialysis access will be placed.  Patient received bariatric walker last admission 01/06/2017.  He had a referral to [facility] but did not allow the agency to visit. Agency made 6 calls and left messages which were not returned.  No one answered to knock at the door. Agency was aware of dialysis scheduled and this was not the reason agency could not make contact at home. [facility] stated that agency would accept patient for services if patient  demonstrates that he really wants to receive the service.

## 2017-01-25 NOTE — Progress Notes (Signed)
Infectious disease called me to say that even though the patient has a flexi-seal, it is possible that the patient can leak around the seal and he is positive for an Ecoli infection. Therefore, they have reinitiated enteric precautions until 48 hours after diarrhea symptoms have subsided.  Enteric precautions have been reinitiated.

## 2017-01-26 LAB — BASIC METABOLIC PANEL
ANION GAP: 11 (ref 5–15)
BUN: 38 mg/dL — AB (ref 6–20)
CALCIUM: 7.7 mg/dL — AB (ref 8.9–10.3)
CO2: 22 mmol/L (ref 22–32)
CREATININE: 4.42 mg/dL — AB (ref 0.61–1.24)
Chloride: 101 mmol/L (ref 101–111)
GFR, EST AFRICAN AMERICAN: 16 mL/min — AB (ref >60)
GFR, EST NON AFRICAN AMERICAN: 14 mL/min — AB (ref >60)
GLUCOSE: 144 mg/dL — AB (ref 65–99)
POTASSIUM: 3.1 mmol/L — AB (ref 3.5–5.1)
Sodium: 134 mmol/L — ABNORMAL LOW (ref 135–145)

## 2017-01-26 LAB — CULTURE, BLOOD (ROUTINE X 2)

## 2017-01-26 LAB — CBC
HCT: 22.8 % — ABNORMAL LOW (ref 40.0–52.0)
Hemoglobin: 7.6 g/dL — ABNORMAL LOW (ref 13.0–18.0)
MCH: 29.8 pg (ref 26.0–34.0)
MCHC: 33.3 g/dL (ref 32.0–36.0)
MCV: 89.4 fL (ref 80.0–100.0)
Platelets: 137 10*3/uL — ABNORMAL LOW (ref 150–440)
RBC: 2.55 MIL/uL — ABNORMAL LOW (ref 4.40–5.90)
RDW: 15.3 % — AB (ref 11.5–14.5)
WBC: 6.4 10*3/uL (ref 3.8–10.6)

## 2017-01-26 LAB — HEPARIN LEVEL (UNFRACTIONATED)
HEPARIN UNFRACTIONATED: 0.11 [IU]/mL — AB (ref 0.30–0.70)
Heparin Unfractionated: 0.23 IU/mL — ABNORMAL LOW (ref 0.30–0.70)
Heparin Unfractionated: 0.24 IU/mL — ABNORMAL LOW (ref 0.30–0.70)

## 2017-01-26 LAB — GLUCOSE, CAPILLARY
GLUCOSE-CAPILLARY: 138 mg/dL — AB (ref 65–99)
Glucose-Capillary: 132 mg/dL — ABNORMAL HIGH (ref 65–99)
Glucose-Capillary: 171 mg/dL — ABNORMAL HIGH (ref 65–99)
Glucose-Capillary: 183 mg/dL — ABNORMAL HIGH (ref 65–99)

## 2017-01-26 LAB — MAGNESIUM: MAGNESIUM: 1.8 mg/dL (ref 1.7–2.4)

## 2017-01-26 LAB — PHOSPHORUS: PHOSPHORUS: 4.4 mg/dL (ref 2.5–4.6)

## 2017-01-26 MED ORDER — HEPARIN BOLUS VIA INFUSION
1600.0000 [IU] | Freq: Once | INTRAVENOUS | Status: AC
  Administered 2017-01-26: 1600 [IU] via INTRAVENOUS
  Filled 2017-01-26: qty 1600

## 2017-01-26 MED ORDER — DEXTROSE 5 % IV SOLN
INTRAVENOUS | Status: DC
  Filled 2017-01-26: qty 20

## 2017-01-26 MED ORDER — HEPARIN BOLUS VIA INFUSION
3000.0000 [IU] | Freq: Once | INTRAVENOUS | Status: AC
  Administered 2017-01-26: 3000 [IU] via INTRAVENOUS
  Filled 2017-01-26: qty 3000

## 2017-01-26 NOTE — Consult Note (Signed)
ANTICOAGULATION CONSULT NOTE - Initial Consult  Pharmacy Consult for heparin drip Indication: chest pain/ACS  Allergies  Allergen Reactions  . Naproxen Rash    Patient Measurements: Height: 5\' 10"  (177.8 cm) Weight: (!) 328 lb 7.8 oz (149 kg) IBW/kg (Calculated) : 73 Heparin Dosing Weight: 107.4kg  Vital Signs: Temp: 98.4 F (36.9 C) (12/05 0747) Temp Source: Oral (12/05 0747) BP: 122/78 (12/05 0700) Pulse Rate: 96 (12/05 0747)  Labs: Recent Labs    01/24/17 0253 01/24/17 0921 01/24/17 1502 01/24/17 2359 01/25/17 0517 01/25/17 1119 01/26/17 0150 01/26/17 0357 01/26/17 0830  HGB 9.1*  --   --   --  8.8*  --   --  7.6*  --   HCT 27.4*  --   --   --  26.4*  --   --  22.8*  --   PLT 178  --   --   --  161  --   --  137*  --   HEPARINUNFRC  --  0.22*  --  0.18* 0.39 0.20* 0.11*  --  0.23*  CREATININE 5.43*  --   --   --  6.61*  --   --  4.42*  --   TROPONINI 52.89* 36.73* 30.22* 19.35*  --   --   --   --   --     Estimated Creatinine Clearance: 27 mL/min (A) (by C-G formula based on SCr of 4.42 mg/dL (H)).   Medical History: Past Medical History:  Diagnosis Date  . CHF (congestive heart failure) ([place])   . COPD (chronic obstructive pulmonary disease) ([place])   . Diabetes mellitus type 2 in obese ([place])   . DM (diabetes mellitus) type II controlled with renal manifestation ([place])   . Hypercholesteremia   . Hypertension   . Morbid obesity with BMI of 45.0-49.9, adult ([place])   . Neuropathy   . Pancreatitis, acute   . Pneumonia   . Spinal stenosis     Medications:  Scheduled:  . aspirin EC  81 mg Oral Daily  . atorvastatin  80 mg Oral Daily  . epoetin (EPOGEN/PROCRIT) injection  4,000 Units Intravenous Q T,Th,Sa-HD  . gabapentin  100 mg Oral QHS  . heparin  1,600 Units Intravenous Once  . heparin  4,000 Units Intracatheter Once  . insulin aspart  0-5 Units Subcutaneous QHS  . insulin aspart  0-9 Units Subcutaneous TID WC  . levothyroxine  50 mcg Oral QAC  breakfast  . sertraline  25 mg Oral Daily  . sodium chloride flush  10-40 mL Intracatheter Q12H    Assessment: Pt is a 56 year old obese male who was brought in by EMS with generalized weakness, hypotension. Pt found to have a troponin of >65. Pharmacy consulted to dose heparin drip with no bolus- per Dr. Margaretmary Eddy.   Goal of Therapy:  Heparin level 0.3-0.7 units/ml Monitor platelets by anticoagulation protocol: Yes   Plan:  Will bolus heparin 1600 units iv once and increase infusion to 2300 units/hr. Will recheck a HL in 6 hours.   Ulice Dash, PharmD Clinical Pharmacist  01/26/2017

## 2017-01-26 NOTE — Progress Notes (Signed)
ANTICOAGULATION CONSULT NOTE - Follow Up Consult  Pharmacy Consult for Heparin  Indication: chest pain/ACS  Allergies  Allergen Reactions  . Naproxen Rash    Patient Measurements: Height: 5\' 10"  (177.8 cm) Weight: (!) 328 lb 7.8 oz (149 kg) IBW/kg (Calculated) : 73 Heparin Dosing Weight: 107.4 kg   Vital Signs: Temp: 98.8 F (37.1 C) (12/05 1600) Temp Source: Oral (12/05 1600) BP: 118/71 (12/05 1800) Pulse Rate: 97 (12/05 1800)  Labs: Recent Labs    01/24/17 0253 01/24/17 0921 01/24/17 1502 01/24/17 2359 01/25/17 0517  01/26/17 0150 01/26/17 0357 01/26/17 0830 01/26/17 1959  HGB 9.1*  --   --   --  8.8*  --   --  7.6*  --   --   HCT 27.4*  --   --   --  26.4*  --   --  22.8*  --   --   PLT 178  --   --   --  161  --   --  137*  --   --   HEPARINUNFRC  --  0.22*  --  0.18* 0.39   < > 0.11*  --  0.23* 0.24*  CREATININE 5.43*  --   --   --  6.61*  --   --  4.42*  --   --   TROPONINI 52.89* 36.73* 30.22* 19.35*  --   --   --   --   --   --    < > = values in this interval not displayed.    Estimated Creatinine Clearance: 27 mL/min (A) (by C-G formula based on SCr of 4.42 mg/dL (H)).   Medications:  Scheduled:  . aspirin EC  81 mg Oral Daily  . atorvastatin  80 mg Oral Daily  . epoetin (EPOGEN/PROCRIT) injection  4,000 Units Intravenous Q T,Th,Sa-HD  . gabapentin  100 mg Oral QHS  . heparin  1,600 Units Intravenous Once  . heparin  4,000 Units Intracatheter Once  . insulin aspart  0-5 Units Subcutaneous QHS  . insulin aspart  0-9 Units Subcutaneous TID WC  . levothyroxine  50 mcg Oral QAC breakfast  . sertraline  25 mg Oral Daily  . sodium chloride flush  10-40 mL Intracatheter Q12H    Assessment: Pt is a 56 year old obese male who was brought in by EMS with generalized weakness, hypotension. Pt found to have a troponin of >65. Pharmacy consulted to dose heparin drip with no bolus- per Dr. Margaretmary Eddy.      Goal of Therapy:  Heparin level 0.3-0.7  units/ml Monitor platelets by anticoagulation protocol: Yes   Plan:  12/5:  HL @ 20:00 = 0.24 Will order Heparin 1600 units IV X 1 and increase drip rate to 2500 units/hr.  Will recheck HL 8 hrs after rate change.   Kiandre Spagnolo D 01/26/2017,8:54 PM

## 2017-01-26 NOTE — Progress Notes (Addendum)
[facility] [place] NAME: Brian [name]    MR#:  [id number]  DATE OF BIRTH:  Aug 19, 1960  SUBJECTIVE:  CHIEF COMPLAINT:   Chief Complaint  Patient presents with  . Weakness   Generalized weakness and better diarrhea.  Hypotension improved. REVIEW OF SYSTEMS:  Review of Systems  Constitutional: Positive for malaise/fatigue. Negative for chills and fever.  HENT: Negative for sore throat.   Eyes: Negative for blurred vision and double vision.  Respiratory: Negative for cough, hemoptysis, shortness of breath, wheezing and stridor.   Cardiovascular: Negative for chest pain, palpitations, orthopnea and leg swelling.  Gastrointestinal: Positive for diarrhea. Negative for abdominal pain, blood in stool, melena, nausea and vomiting.  Genitourinary: Negative for dysuria, flank pain and hematuria.  Musculoskeletal: Negative for back pain and joint pain.  Neurological: Positive for weakness. Negative for dizziness, sensory change, focal weakness, seizures, loss of consciousness and headaches.  Endo/Heme/Allergies: Negative for polydipsia.  Psychiatric/Behavioral: Negative for depression. The patient is not nervous/anxious.     DRUG ALLERGIES:   Allergies  Allergen Reactions  . Naproxen Rash   VITALS:  Blood pressure 117/68, pulse 87, temperature 99.2 F (37.3 C), temperature source Oral, resp. rate 18, height 5\' 10"  (1.778 m), weight (!) 328 lb 7.8 oz (149 kg), SpO2 95 %. PHYSICAL EXAMINATION:  Physical Exam  Constitutional: He is oriented to person, place, and time and well-developed, well-nourished, and in no distress.  Morbid obese  HENT:  Head: Normocephalic.  Eyes: Conjunctivae and EOM are normal. Pupils are equal, round, and reactive to light. No scleral icterus.  Neck: Normal range of motion. Neck supple. No JVD present. No tracheal deviation present.  Cardiovascular: Normal rate, regular rhythm and normal heart sounds. Exam  reveals no gallop.  No murmur heard. Pulmonary/Chest: Effort normal and breath sounds normal. No respiratory distress. He has no wheezes. He has no rales.  Abdominal: Soft. Bowel sounds are normal. He exhibits no distension. There is no tenderness. There is no rebound.  Musculoskeletal: Normal range of motion. He exhibits no edema or tenderness.  Neurological: He is alert and oriented to person, place, and time. No cranial nerve deficit.  Skin: No rash noted. No erythema.  Psychiatric: Affect normal.   LABORATORY PANEL:  Male CBC Recent Labs  Lab 01/26/17 0357  WBC 6.4  HGB 7.6*  HCT 22.8*  PLT 137*   ------------------------------------------------------------------------------------------------------------------ Chemistries  Recent Labs  Lab 01/23/17 1144  01/26/17 0357  NA 132*   < > 134*  K 4.3   < > 3.1*  CL 100*   < > 101  CO2 20*   < > 22  GLUCOSE 182*   < > 144*  BUN 52*   < > 38*  CREATININE 4.92*   < > 4.42*  CALCIUM 8.5*   < > 7.7*  MG  --    < > 1.8  AST 119*  --   --   ALT 30  --   --   ALKPHOS 68  --   --   BILITOT 0.4  --   --    < > = values in this interval not displayed.   RADIOLOGY:  Dg Chest 1 View  Result Date: 01/25/2017 CLINICAL DATA:  Central line placed EXAM: CHEST 1 VIEW COMPARISON:  Portable chest x-ray of 01/23/2017 FINDINGS: The tip of the left IJ central venous line is at the approximate junction of the left innominate vein and SVC. No pneumothorax  is seen. There is cardiomegaly present with probable mild pulmonary vascular congestion. No pleural effusion is seen. Right central venous line tip is unchanged overlying the mid SVC. IMPRESSION: 1. Left central venous line tip overlies the junction of the left innominate vein and SVC. 2. Cardiomegaly and mild pulmonary vascular congestion. 3. Right central venous line tip overlies the mid SVC. Electronically Signed   By: Ivar Drape M.D.   On: 01/25/2017 16:53   ASSESSMENT AND PLAN:   Brian [name]  is a 56 y.o. male with a known history of COPD, chronic diastolic congestive heart failure, end-stage renal disease recently started on hemodialysis was just admitted to hospital on 12/29/2016 and discharged on 01/06/2017 with CHF exacerbation is presenting to the ED with a chief complaint of generalized weakness. By the time he came into the hospital he is hypotensive. Patient's initial troponin is at 65 EKG with no ST elevations.  #Non-STEMI Continue heparin drip, ASA and atorvastatin, echo is unremarkable. Per Dr. Ubaldo Glassing, consider cath when septic picture improves to evaluate anatomy. Not candidate for beta blockers and ace I given hemodynamics.   #Septic shock with MSSA bacteremia, possible due to permacath infection. Off Levophed drip.  Blood cultures show positive MSSA. He was on broad-spectrum IV antibiotic Zosyn and vancomycin.  Per Dr. Ola Spurr, change to ancef, Min 2 weeks IV abx will be needed. Check echo - if stable can consider TEE if TTE neg for vegetations.  Removed permcath.  NO GROWTH 2 DAYS per repeated blood culture.  ESRD. Temporary dialysis cathter placed and got HD yesterday, dialysis tomorrow per Dr. Candiss Norse.  Hyponatremia.  Improved.  Diarrhea. Negative C. difficile test. Enteropathogenic E coli (EPEC)  #Chronic diastolic congestive heart failure with fluid overload  #Chronic history of obstructive sleep apnea Patient is noncompliant with CPAP machine Continue oxygen via nasal cannula  Anemia of chronic disease.  Stable.  EPO with HD.  Hypokalemia.  Adjust potassium during dialysis.  Discussed with Dr. Candiss Norse. All the records are reviewed and case discussed with Care Management/Social Worker. Management plans discussed with the patient, his wife and they are in agreement.  CODE STATUS: Full Code  TOTAL TIME TAKING CARE OF THIS PATIENT: 36 minutes.   More than 50% of the time was spent in counseling/coordination of care: YES  POSSIBLE D/C IN 2  DAYS, DEPENDING ON CLINICAL CONDITION.   Demetrios Loll M.D on 01/26/2017 at 1:54 PM  Between 7am to 6pm - Pager - [phone]  After 6pm go to [email or web address] - [facility]

## 2017-01-26 NOTE — Progress Notes (Signed)
Patient will need to be afebrile for 48 hours with negative blood cultures before new permcath can be placed. Possible placement Friday.

## 2017-01-26 NOTE — Progress Notes (Signed)
[facility] PROGRESS NOTE Date of Admission:  01/23/2017     ID: Brian [name] is a 56 y.o. male with MSSA bacteremia Active Problems:   NSTEMI (non-ST elevated myocardial infarction) (HCC)   Subjective: No fevers, BP stable. Transferring to floor  ROS  Eleven systems are reviewed and negative except per hpi  Medications:  Antibiotics Given (last 72 hours)    Date/Time Action Medication Dose Rate   01/23/17 1652 New Bag/Given   vancomycin (VANCOCIN) 1,250 mg in sodium chloride 0.9 % 250 mL IVPB 1,250 mg 166.7 mL/hr   01/23/17 1715 New Bag/Given   piperacillin-tazobactam (ZOSYN) IVPB 3.375 g 3.375 g 12.5 mL/hr   01/24/17 0542 New Bag/Given   piperacillin-tazobactam (ZOSYN) IVPB 3.375 g 3.375 g 12.5 mL/hr   01/24/17 1705 New Bag/Given   ceFAZolin (ANCEF) 2 g in dextrose 5 % 100 mL injection     01/25/17 0525 New Bag/Given   ceFAZolin (ANCEF) 2 g in dextrose 5 % 100 mL injection     01/25/17 1958 New Bag/Given   ceFAZolin (ANCEF) 1 g in dextrose 5 % 50 mL injection       . aspirin EC  81 mg Oral Daily  . atorvastatin  80 mg Oral Daily  . epoetin (EPOGEN/PROCRIT) injection  4,000 Units Intravenous Q T,Th,Sa-HD  . gabapentin  100 mg Oral QHS  . heparin  4,000 Units Intracatheter Once  . insulin aspart  0-5 Units Subcutaneous QHS  . insulin aspart  0-9 Units Subcutaneous TID WC  . levothyroxine  50 mcg Oral QAC breakfast  . sertraline  25 mg Oral Daily  . sodium chloride flush  10-40 mL Intracatheter Q12H    Objective: Vital signs in last 24 hours: Temp:  [98.4 F (36.9 C)-99.2 F (37.3 C)] 99.2 F (37.3 C) (12/05 1200) Pulse Rate:  [87-115] 87 (12/05 1300) Resp:  [15-23] 18 (12/05 1300) BP: (95-137)/(47-87) 117/68 (12/05 1300) SpO2:  [87 %-99 %] 95 % (12/05 1300) Weight:  [149 kg (328 lb 7.8 oz)-149.5 kg (329 lb 9.4 oz)] 149 kg (328 lb 7.8 oz) (12/05 0500) Constitutional: He is oriented to person, place, and time. Morbidly obese HENT:  anicteric Mouth/Throat: Oropharynx is clear and moist. No oropharyngeal exudate.  Cardiovascular: very distant Pulmonary/Chest: Effort normal and breath sounds normal. No respiratory distress. He has no wheezes.  Abdominal: Soft. Bowel sounds are normal. He exhibits no distension. There is no tenderness.  Lymphadenopathy: He has no cervical adenopathy.  Neurological: He is alert and oriented to person, place, and time.  Ext 1+ bil edema. L foot missing 3rd toe Skin: Skin is warm and dry. LLE with a few scaly lesions Psychiatric: He has a normal mood and affect. His behavior is normal.  Access-  R permacath site covered after removal   Lab Results Recent Labs    01/25/17 0517 01/26/17 0357  WBC 7.2 6.4  HGB 8.8* 7.6*  HCT 26.4* 22.8*  NA 129* 134*  K 3.7 3.1*  CL 101 101  CO2 17* 22  BUN 72* 38*  CREATININE 6.61* 4.42*    Microbiology: Results for orders placed or performed during the hospital encounter of 01/23/17  CULTURE, BLOOD (ROUTINE X 2) w Reflex to ID Panel     Status: Abnormal   Collection Time: 01/23/17 11:44 AM  Result Value Ref Range Status   Specimen Description BLOOD RIGHT ANTECUBITAL  Final   Special Requests   Final    BOTTLES DRAWN AEROBIC AND ANAEROBIC Blood  Culture results may not be optimal due to an excessive volume of blood received in culture bottles   Culture  Setup Time   Final    GRAM POSITIVE COCCI IN BOTH AEROBIC AND ANAEROBIC BOTTLES CRITICAL RESULT CALLED TO, READ BACK BY AND VERIFIED WITH: MATT MCBANE AT [id number] ON 01/24/17 [place].    Culture STAPHYLOCOCCUS AUREUS (A)  Final   Report Status 01/26/2017 FINAL  Final   Organism ID, Bacteria STAPHYLOCOCCUS AUREUS  Final      Susceptibility   Staphylococcus aureus - MIC*    CIPROFLOXACIN <=0.5 SENSITIVE Sensitive     ERYTHROMYCIN <=0.25 SENSITIVE Sensitive     GENTAMICIN <=0.5 SENSITIVE Sensitive     OXACILLIN <=0.25 SENSITIVE Sensitive     TETRACYCLINE <=1 SENSITIVE Sensitive     VANCOMYCIN  <=0.5 SENSITIVE Sensitive     TRIMETH/SULFA <=10 SENSITIVE Sensitive     CLINDAMYCIN <=0.25 SENSITIVE Sensitive     RIFAMPIN <=0.5 SENSITIVE Sensitive     Inducible Clindamycin NEGATIVE Sensitive     * STAPHYLOCOCCUS AUREUS  CULTURE, BLOOD (ROUTINE X 2) w Reflex to ID Panel     Status: Abnormal   Collection Time: 01/23/17 11:44 AM  Result Value Ref Range Status   Specimen Description BLOOD BLOOD LEFT FOREARM  Final   Special Requests   Final    BOTTLES DRAWN AEROBIC AND ANAEROBIC Blood Culture results may not be optimal due to an excessive volume of blood received in culture bottles   Culture  Setup Time   Final    GRAM POSITIVE COCCI IN BOTH AEROBIC AND ANAEROBIC BOTTLES CRITICAL VALUE NOTED.  VALUE IS CONSISTENT WITH PREVIOUSLY REPORTED AND CALLED VALUE.    Culture (A)  Final    STAPHYLOCOCCUS AUREUS SUSCEPTIBILITIES PERFORMED ON PREVIOUS CULTURE WITHIN THE LAST 5 DAYS. Performed at [facility], [place] [address] [place], [place] [address]    Report Status 01/26/2017 FINAL  Final  Blood Culture ID Panel (Reflexed)     Status: Abnormal   Collection Time: 01/23/17 11:44 AM  Result Value Ref Range Status   Enterococcus species NOT DETECTED NOT DETECTED Final   Listeria monocytogenes NOT DETECTED NOT DETECTED Final   Staphylococcus species DETECTED (A) NOT DETECTED Final    Comment: CRITICAL RESULT CALLED TO, READ BACK BY AND VERIFIED WITH: MATT MCBANE AT 0700 ON 01/24/17 [place].    Staphylococcus aureus DETECTED (A) NOT DETECTED Final    Comment: Methicillin (oxacillin) susceptible Staphylococcus aureus (MSSA). Preferred therapy is anti staphylococcal beta lactam antibiotic (Cefazolin or Nafcillin), unless clinically contraindicated. CRITICAL RESULT CALLED TO, READ BACK BY AND VERIFIED WITH: MATT MCBANE AT 0700 ON 01/24/17 [place].    Methicillin resistance NOT DETECTED NOT DETECTED Final   Streptococcus species NOT DETECTED NOT DETECTED Final   Streptococcus agalactiae NOT DETECTED  NOT DETECTED Final   Streptococcus pneumoniae NOT DETECTED NOT DETECTED Final   Streptococcus pyogenes NOT DETECTED NOT DETECTED Final   Acinetobacter baumannii NOT DETECTED NOT DETECTED Final   Enterobacteriaceae species NOT DETECTED NOT DETECTED Final   Enterobacter cloacae complex NOT DETECTED NOT DETECTED Final   Escherichia coli NOT DETECTED NOT DETECTED Final   Klebsiella oxytoca NOT DETECTED NOT DETECTED Final   Klebsiella pneumoniae NOT DETECTED NOT DETECTED Final   Proteus species NOT DETECTED NOT DETECTED Final   Serratia marcescens NOT DETECTED NOT DETECTED Final   Haemophilus influenzae NOT DETECTED NOT DETECTED Final   Neisseria meningitidis NOT DETECTED NOT DETECTED Final   Pseudomonas aeruginosa NOT DETECTED NOT  DETECTED Final   Candida albicans NOT DETECTED NOT DETECTED Final   Candida glabrata NOT DETECTED NOT DETECTED Final   Candida krusei NOT DETECTED NOT DETECTED Final   Candida parapsilosis NOT DETECTED NOT DETECTED Final   Candida tropicalis NOT DETECTED NOT DETECTED Final  C difficile quick scan w PCR reflex     Status: None   Collection Time: 01/23/17  1:24 PM  Result Value Ref Range Status   C Diff antigen NEGATIVE NEGATIVE Final   C Diff toxin NEGATIVE NEGATIVE Final   C Diff interpretation No C. difficile detected.  Final  MRSA PCR Screening     Status: None   Collection Time: 01/23/17  2:57 PM  Result Value Ref Range Status   MRSA by PCR NEGATIVE NEGATIVE Final    Comment:        The GeneXpert MRSA Assay (FDA approved for NASAL specimens only), is one component of a comprehensive MRSA colonization surveillance program. It is not intended to diagnose MRSA infection nor to guide or monitor treatment for MRSA infections.   Culture, blood (single) w Reflex to ID Panel     Status: None (Preliminary result)   Collection Time: 01/24/17  4:34 PM  Result Value Ref Range Status   Specimen Description BLOOD BLOOD RIGHT HAND  Final   Special Requests    Final    BOTTLES DRAWN AEROBIC AND ANAEROBIC Blood Culture adequate volume   Culture NO GROWTH 2 DAYS  Final   Report Status PENDING  Incomplete  Cath Tip Culture     Status: None (Preliminary result)   Collection Time: 01/24/17  4:42 PM  Result Value Ref Range Status   Specimen Description CATH TIP  Final   Special Requests NONE  Final   Culture   Final    >100,000 CFU STAPHYLOCOCCUS AUREUS SUSCEPTIBILITIES TO FOLLOW Performed at [facility], [place] [address], [place], [place] [address]    Report Status PENDING  Incomplete  Gastrointestinal Panel by PCR , Stool     Status: Abnormal   Collection Time: 01/24/17  9:23 PM  Result Value Ref Range Status   Campylobacter species NOT DETECTED NOT DETECTED Final   Plesimonas shigelloides NOT DETECTED NOT DETECTED Final   Salmonella species NOT DETECTED NOT DETECTED Final   Yersinia enterocolitica NOT DETECTED NOT DETECTED Final   Vibrio species NOT DETECTED NOT DETECTED Final   Vibrio cholerae NOT DETECTED NOT DETECTED Final   Enteroaggregative E coli (EAEC) NOT DETECTED NOT DETECTED Final   Enteropathogenic E coli (EPEC) DETECTED (A) NOT DETECTED Final    Comment: RESULT CALLED TO, READ BACK BY AND VERIFIED WITH: CHELSEA WRENN AT 0011 01/25/17 [place]    Enterotoxigenic E coli (ETEC) NOT DETECTED NOT DETECTED Final   Shiga like toxin producing E coli (STEC) NOT DETECTED NOT DETECTED Final   Shigella/Enteroinvasive E coli (EIEC) NOT DETECTED NOT DETECTED Final   Cryptosporidium NOT DETECTED NOT DETECTED Final   Cyclospora cayetanensis NOT DETECTED NOT DETECTED Final   Entamoeba histolytica NOT DETECTED NOT DETECTED Final   Giardia lamblia NOT DETECTED NOT DETECTED Final   Adenovirus F40/41 NOT DETECTED NOT DETECTED Final   Astrovirus NOT DETECTED NOT DETECTED Final   Norovirus GI/GII NOT DETECTED NOT DETECTED Final   Rotavirus A NOT DETECTED NOT DETECTED Final   Sapovirus (I, II, IV, and V) NOT DETECTED NOT DETECTED Final     Studies/Results: Dg Chest 1 View  Result Date: 01/25/2017 CLINICAL DATA:  Central line placed EXAM:  CHEST 1 VIEW COMPARISON:  Portable chest x-ray of 01/23/2017 FINDINGS: The tip of the left IJ central venous line is at the approximate junction of the left innominate vein and SVC. No pneumothorax is seen. There is cardiomegaly present with probable mild pulmonary vascular congestion. No pleural effusion is seen. Right central venous line tip is unchanged overlying the mid SVC. IMPRESSION: 1. Left central venous line tip overlies the junction of the left innominate vein and SVC. 2. Cardiomegaly and mild pulmonary vascular congestion. 3. Right central venous line tip overlies the mid SVC. Electronically Signed   By: Ivar Drape M.D.   On: 01/25/2017 16:53    Assessment/Plan: MARQUAVIOUS NAZAR is a 56 y.o. male with Morbid obesity, CHF, ESRD on HD recently through R permacath now with MSSA bacteremia, sepsis and NSTEMI.  He has no evidence of metastatic infection- does have baseline back pain but no worsening. He does not have a PPM.  He had removal  HD cath 12/3 - cath tip +, repeat bcx ngtd from 12/3. Echo poor study, but no veg.   Recommendations Cont  ancef. Would suggest TEE when stable and ok with cardiology Min 2 weeks IV abx will be needed.   Thank you very much for the consult. Will follow with you.  Leonel Ramsay   01/26/2017, 3:23 PM

## 2017-01-26 NOTE — Progress Notes (Signed)
[facility], [place] 01/26/17  Subjective:  Patient is doing fair Denies acute C/O Blood cultures are positive for staph aureus (MSSA) Ate 100% of his  Breakfast Tolerated HD well yesterday/ No fluid removed   Objective:  Vital signs in last 24 hours:  Temp:  [98.4 F (36.9 C)-99 F (37.2 C)] 98.4 F (36.9 C) (12/05 0747) Pulse Rate:  [73-115] 96 (12/05 0747) Resp:  [15-23] 16 (12/05 0747) BP: (88-137)/(55-87) 122/78 (12/05 0700) SpO2:  [87 %-100 %] 95 % (12/05 0747) Weight:  [149 kg (328 lb 7.8 oz)-149.5 kg (329 lb 9.4 oz)] 149 kg (328 lb 7.8 oz) (12/05 0500)  Weight change:  Filed Weights   01/24/17 0500 01/25/17 1530 01/26/17 0500  Weight: (!) 149.7 kg (330 lb 0.5 oz) (!) 149.5 kg (329 lb 9.4 oz) (!) 149 kg (328 lb 7.8 oz)    Intake/Output:    Intake/Output Summary (Last 24 hours) at 01/26/2017 5409 Last data filed at 01/26/2017 0500 Gross per 24 hour  Intake 244.18 ml  Output 0 ml  Net 244.18 ml     Physical Exam: General:  Sitting up  on the side of the bed  HEENT Anicteric, moist mucus membranes  Neck Supple, left IJ CVL  Pulm/lungs  mild scattered rhonchi, otherwise clear  CVS/Heart irregular  Abdomen:  Soft, NT,   Extremities: + dependent edema  Neurologic: Alert, oteinted     Access: Rt IJ temp cath       Basic Metabolic Panel:  Recent Labs  Lab 01/23/17 1144 01/23/17 2057 01/24/17 0253 01/25/17 0517 01/26/17 0357  NA 132*  --  132* 129* 134*  K 4.3  --  3.9 3.7 3.1*  CL 100*  --  103 101 101  CO2 20*  --  18* 17* 22  GLUCOSE 182*  --  153* 159* 144*  BUN 52*  --  58* 72* 38*  CREATININE 4.92*  --  5.43* 6.61* 4.42*  CALCIUM 8.5*  --  8.0* 8.0* 7.7*  MG  --  1.7  --  2.0 1.8  PHOS  --  3.8  --  5.3* 4.4     CBC: Recent Labs  Lab 01/23/17 1144 01/24/17 0253 01/25/17 0517 01/26/17 0357  WBC 9.9 7.2 7.2 6.4  NEUTROABS 8.5*  --   --   --   HGB 9.9* 9.1* 8.8* 7.6*  HCT 30.2* 27.4* 26.4* 22.8*  MCV  91.5 91.0 90.3 89.4  PLT 218 178 161 137*      Lab Results  Component Value Date   HEPBSAG Negative 12/31/2016   HEPBSAB Non Reactive 12/31/2016      Microbiology:  Recent Results (from the past 240 hour(s))  CULTURE, BLOOD (ROUTINE X 2) w Reflex to ID Panel     Status: Abnormal   Collection Time: 01/23/17 11:44 AM  Result Value Ref Range Status   Specimen Description BLOOD RIGHT ANTECUBITAL  Final   Special Requests   Final    BOTTLES DRAWN AEROBIC AND ANAEROBIC Blood Culture results may not be optimal due to an excessive volume of blood received in culture bottles   Culture  Setup Time   Final    GRAM POSITIVE COCCI IN BOTH AEROBIC AND ANAEROBIC BOTTLES CRITICAL RESULT CALLED TO, READ BACK BY AND VERIFIED WITH: MATT MCBANE AT [place] ON 01/24/17 [place].    Culture STAPHYLOCOCCUS AUREUS (A)  Final   Report Status 01/26/2017 FINAL  Final   Organism ID, Bacteria STAPHYLOCOCCUS AUREUS  Final  Susceptibility   Staphylococcus aureus - MIC*    CIPROFLOXACIN <=0.5 SENSITIVE Sensitive     ERYTHROMYCIN <=0.25 SENSITIVE Sensitive     GENTAMICIN <=0.5 SENSITIVE Sensitive     OXACILLIN <=0.25 SENSITIVE Sensitive     TETRACYCLINE <=1 SENSITIVE Sensitive     VANCOMYCIN <=0.5 SENSITIVE Sensitive     TRIMETH/SULFA <=10 SENSITIVE Sensitive     CLINDAMYCIN <=0.25 SENSITIVE Sensitive     RIFAMPIN <=0.5 SENSITIVE Sensitive     Inducible Clindamycin NEGATIVE Sensitive     * STAPHYLOCOCCUS AUREUS  CULTURE, BLOOD (ROUTINE X 2) w Reflex to ID Panel     Status: Abnormal   Collection Time: 01/23/17 11:44 AM  Result Value Ref Range Status   Specimen Description BLOOD BLOOD LEFT FOREARM  Final   Special Requests   Final    BOTTLES DRAWN AEROBIC AND ANAEROBIC Blood Culture results may not be optimal due to an excessive volume of blood received in culture bottles   Culture  Setup Time   Final    GRAM POSITIVE COCCI IN BOTH AEROBIC AND ANAEROBIC BOTTLES CRITICAL VALUE NOTED.  VALUE IS CONSISTENT  WITH PREVIOUSLY REPORTED AND CALLED VALUE.    Culture (A)  Final    STAPHYLOCOCCUS AUREUS SUSCEPTIBILITIES PERFORMED ON PREVIOUS CULTURE WITHIN THE LAST 5 DAYS. Performed at [facility], [place] [address], [place], [place] [address]    Report Status 01/26/2017 FINAL  Final  Blood Culture ID Panel (Reflexed)     Status: Abnormal   Collection Time: 01/23/17 11:44 AM  Result Value Ref Range Status   Enterococcus species NOT DETECTED NOT DETECTED Final   Listeria monocytogenes NOT DETECTED NOT DETECTED Final   Staphylococcus species DETECTED (A) NOT DETECTED Final    Comment: CRITICAL RESULT CALLED TO, READ BACK BY AND VERIFIED WITH: MATT MCBANE AT 0700 ON 01/24/17 [place].    Staphylococcus aureus DETECTED (A) NOT DETECTED Final    Comment: Methicillin (oxacillin) susceptible Staphylococcus aureus (MSSA). Preferred therapy is anti staphylococcal beta lactam antibiotic (Cefazolin or Nafcillin), unless clinically contraindicated. CRITICAL RESULT CALLED TO, READ BACK BY AND VERIFIED WITH: MATT MCBANE AT 0700 ON 01/24/17 [place].    Methicillin resistance NOT DETECTED NOT DETECTED Final   Streptococcus species NOT DETECTED NOT DETECTED Final   Streptococcus agalactiae NOT DETECTED NOT DETECTED Final   Streptococcus pneumoniae NOT DETECTED NOT DETECTED Final   Streptococcus pyogenes NOT DETECTED NOT DETECTED Final   Acinetobacter baumannii NOT DETECTED NOT DETECTED Final   Enterobacteriaceae species NOT DETECTED NOT DETECTED Final   Enterobacter cloacae complex NOT DETECTED NOT DETECTED Final   Escherichia coli NOT DETECTED NOT DETECTED Final   Klebsiella oxytoca NOT DETECTED NOT DETECTED Final   Klebsiella pneumoniae NOT DETECTED NOT DETECTED Final   Proteus species NOT DETECTED NOT DETECTED Final   Serratia marcescens NOT DETECTED NOT DETECTED Final   Haemophilus influenzae NOT DETECTED NOT DETECTED Final   Neisseria meningitidis NOT DETECTED NOT DETECTED Final   Pseudomonas aeruginosa NOT  DETECTED NOT DETECTED Final   Candida albicans NOT DETECTED NOT DETECTED Final   Candida glabrata NOT DETECTED NOT DETECTED Final   Candida krusei NOT DETECTED NOT DETECTED Final   Candida parapsilosis NOT DETECTED NOT DETECTED Final   Candida tropicalis NOT DETECTED NOT DETECTED Final  C difficile quick scan w PCR reflex     Status: None   Collection Time: 01/23/17  1:24 PM  Result Value Ref Range Status   C Diff antigen NEGATIVE NEGATIVE Final   C  Diff toxin NEGATIVE NEGATIVE Final   C Diff interpretation No C. difficile detected.  Final  MRSA PCR Screening     Status: None   Collection Time: 01/23/17  2:57 PM  Result Value Ref Range Status   MRSA by PCR NEGATIVE NEGATIVE Final    Comment:        The GeneXpert MRSA Assay (FDA approved for NASAL specimens only), is one component of a comprehensive MRSA colonization surveillance program. It is not intended to diagnose MRSA infection nor to guide or monitor treatment for MRSA infections.   Culture, blood (single) w Reflex to ID Panel     Status: None (Preliminary result)   Collection Time: 01/24/17  4:34 PM  Result Value Ref Range Status   Specimen Description BLOOD BLOOD RIGHT HAND  Final   Special Requests   Final    BOTTLES DRAWN AEROBIC AND ANAEROBIC Blood Culture adequate volume   Culture NO GROWTH 2 DAYS  Final   Report Status PENDING  Incomplete  Cath Tip Culture     Status: None (Preliminary result)   Collection Time: 01/24/17  4:42 PM  Result Value Ref Range Status   Specimen Description CATH TIP  Final   Special Requests NONE  Final   Culture   Final    >100,000 CFU STAPHYLOCOCCUS AUREUS SUSCEPTIBILITIES TO FOLLOW Performed at [facility], [place] [address], [place], [place] [address]    Report Status PENDING  Incomplete  Gastrointestinal Panel by PCR , Stool     Status: Abnormal   Collection Time: 01/24/17  9:23 PM  Result Value Ref Range Status   Campylobacter species NOT DETECTED NOT DETECTED Final    Plesimonas shigelloides NOT DETECTED NOT DETECTED Final   Salmonella species NOT DETECTED NOT DETECTED Final   Yersinia enterocolitica NOT DETECTED NOT DETECTED Final   Vibrio species NOT DETECTED NOT DETECTED Final   Vibrio cholerae NOT DETECTED NOT DETECTED Final   Enteroaggregative E coli (EAEC) NOT DETECTED NOT DETECTED Final   Enteropathogenic E coli (EPEC) DETECTED (A) NOT DETECTED Final    Comment: RESULT CALLED TO, READ BACK BY AND VERIFIED WITH: CHELSEA WRENN AT 0011 01/25/17 [place]    Enterotoxigenic E coli (ETEC) NOT DETECTED NOT DETECTED Final   Shiga like toxin producing E coli (STEC) NOT DETECTED NOT DETECTED Final   Shigella/Enteroinvasive E coli (EIEC) NOT DETECTED NOT DETECTED Final   Cryptosporidium NOT DETECTED NOT DETECTED Final   Cyclospora cayetanensis NOT DETECTED NOT DETECTED Final   Entamoeba histolytica NOT DETECTED NOT DETECTED Final   Giardia lamblia NOT DETECTED NOT DETECTED Final   Adenovirus F40/41 NOT DETECTED NOT DETECTED Final   Astrovirus NOT DETECTED NOT DETECTED Final   Norovirus GI/GII NOT DETECTED NOT DETECTED Final   Rotavirus A NOT DETECTED NOT DETECTED Final   Sapovirus (I, II, IV, and V) NOT DETECTED NOT DETECTED Final    Coagulation Studies: Recent Labs    01/23/17 1144  LABPROT 15.9*  INR 1.28    Urinalysis: No results for input(s): COLORURINE, LABSPEC, PHURINE, GLUCOSEU, HGBUR, BILIRUBINUR, KETONESUR, PROTEINUR, UROBILINOGEN, NITRITE, LEUKOCYTESUR in the last 72 hours.  Invalid input(s): APPERANCEUR    Imaging: Dg Chest 1 View  Result Date: 01/25/2017 CLINICAL DATA:  Central line placed EXAM: CHEST 1 VIEW COMPARISON:  Portable chest x-ray of 01/23/2017 FINDINGS: The tip of the left IJ central venous line is at the approximate junction of the left innominate vein and SVC. No pneumothorax is seen. There is cardiomegaly present with  probable mild pulmonary vascular congestion. No pleural effusion is seen. Right central venous line tip is  unchanged overlying the mid SVC. IMPRESSION: 1. Left central venous line tip overlies the junction of the left innominate vein and SVC. 2. Cardiomegaly and mild pulmonary vascular congestion. 3. Right central venous line tip overlies the mid SVC. Electronically Signed   By: Ivar Drape M.D.   On: 01/25/2017 16:53     Medications:   . small volume/piggyback builder    . heparin 2,100 Units/hr (01/26/17 0500)   . aspirin EC  81 mg Oral Daily  . atorvastatin  80 mg Oral Daily  . epoetin (EPOGEN/PROCRIT) injection  4,000 Units Intravenous Q T,Th,Sa-HD  . gabapentin  100 mg Oral QHS  . heparin  4,000 Units Intracatheter Once  . insulin aspart  0-5 Units Subcutaneous QHS  . insulin aspart  0-9 Units Subcutaneous TID WC  . levothyroxine  50 mcg Oral QAC breakfast  . sertraline  25 mg Oral Daily  . sodium chloride flush  10-40 mL Intracatheter Q12H   acetaminophen, albuterol, nitroGLYCERIN, ondansetron (ZOFRAN) IV, oxyCODONE, sodium chloride flush  Assessment/ Plan:  56 y.o. caucasian male with end stage renal disease on hemodialysis, diabetes mellitus type II insulin dependent, diabetic peripheral neuropathy, right toe amputation, hypertension, hyperlipidemia, COPD/tobacco use, depression, coronary artery disease  TTS [facility] RIJ permcath  1. ESRD 2, Sepsis, Staph aureus (MSSA). Cath tip culture is also POS for S Aureus 3. Acute coronary syndrome/ NSTEMI/ Followed by Dr Ubaldo Glassing 4. Edema 5. AoCKD 6. SHPTH  Plan: Tunnel dialysis catheter has been removed.  Temporary dialysis catheter in place Antibiotics as per ICU and ID team; currently getting cefazolin Plan for dialysis tomorrow EPO with HD On Heparin drip for ACS, [facility] cardiology following Plan for heart catheterization once sepsis is resolved 2 D echo shows EF 50-55% Phos 4.4, monitor    LOS: 3 [facility] 12/5/20189:17 [place], [place]

## 2017-01-26 NOTE — Evaluation (Signed)
Physical Therapy Evaluation Patient Details Name: Brian [name] MRN: [id number] DOB: 1960-03-14 Today's Date: 01/26/2017   History of Present Illness  Pt is a 56 y/o M who was just admitted to hospital on 12/29/2016 and discharged on 01/06/2017 with CHF exacerbation, currently presenting to the ED with a chief complaint of generalized weakness. By the time he came into the hospital he was hypotensive.Patient's initial troponin was 65.  Pt reports nausea, vomiting, and diarrhea for 2 days and missed dialysis the day PTA. Pt admitted with NSTEMI and met sepsis criteria.  R jugular permcath removed and temporary cath placed.  Pt's PMH includes CHF, COPD, morbid obesity, spinal stenosis.     Clinical Impression  Pt admitted with above diagnosis. Pt currently with functional limitations due to the deficits listed below (see PT Problem List). Mr. Derusha has had 7 falls in the past 6 months.  He currently requires min assist for sit>stand and ambulatory distance limited to 15 ft due to fatigue.  VSS throughout session with pt on 2L O2.  Given pt's current mobility status, recommending SNF at d/c.  Pt will benefit from skilled PT to increase their independence and safety with mobility to allow discharge to the venue listed below.      Follow Up Recommendations SNF    Equipment Recommendations  None recommended by PT    Recommendations for Other Services       Precautions / Restrictions Precautions Precautions: Fall;Other (comment) Precaution Comments: O2, temporary cath Restrictions Weight Bearing Restrictions: No      Mobility  Bed Mobility Overal bed mobility: Needs Assistance Bed Mobility: Supine to Sit     Supine to sit: Min guard;HOB elevated     General bed mobility comments: Increased time and effort with use of bed rail.  Close min guard as pt demonstrates instability as he nears EOB.   Transfers Overall transfer level: Needs assistance Equipment used: Rolling walker (2  wheeled) Transfers: Sit to/from Stand Sit to Stand: Min assist         General transfer comment: Assist to boost to standing with cues for proper hand placement and for Bil knee flexion for greater mechanical advantage. Pt requires multiple attempts to achieve standing.    Ambulation/Gait Ambulation/Gait assistance: Min guard Ambulation Distance (Feet): 15 Feet Assistive device: Rolling walker (2 wheeled) Gait Pattern/deviations: Shuffle;Trunk flexed;Wide base of support Gait velocity: decreased Gait velocity interpretation: Below normal speed for age/gender General Gait Details: Pt with wide BOS and shuffles his feet with intermittent minimal foot clearance. Pt with flexed posture and fatigues quickly.  VSS throughout.   Stairs            Wheelchair Mobility    Modified Rankin (Stroke Patients Only)       Balance Overall balance assessment: Needs assistance;History of Falls Sitting-balance support: No upper extremity supported;Feet supported Sitting balance-Leahy Scale: Fair     Standing balance support: Bilateral upper extremity supported;During functional activity Standing balance-Leahy Scale: Poor Standing balance comment: Pt relies on UE support for static and dynamic activities                             Pertinent Vitals/Pain Pain Assessment: 0-10 Pain Score: 8  Pain Location: Chronic back and Bil knee pain Pain Descriptors / Indicators: Discomfort;Grimacing Pain Intervention(s): Limited activity within patient's tolerance;Monitored during session;Repositioned    Home Living Family/patient expects to be discharged to:: Private residence Living Arrangements: Spouse/significant other Available  Help at Discharge: Family;Available 24 hours/day(But wife unable to provide physical assist) Type of Home: Mobile home Home Access: Stairs to enter Entrance Stairs-Rails: Left;Right;Can reach both Entrance Stairs-Number of Steps: 6 Home Layout: One  level Home Equipment: Walker - 2 wheels;Shower seat;Grab bars - tub/shower;Hand held shower head;Cane - single point;Transport chair      Prior Function Level of Independence: Needs assistance   Gait / Transfers Assistance Needed: Pt ambulating in home with SPC and in community with RW, limited distances due to fatigue.  Uses O2 at night only at [place].  7 falls in the past 6 months.    ADL's / Homemaking Assistance Needed: Needs min assist for bathing, dressing.          Hand Dominance        Extremity/Trunk Assessment   Upper Extremity Assessment Upper Extremity Assessment: (BUE strength grossly 3/5)    Lower Extremity Assessment Lower Extremity Assessment: (BLE strength 3/5)       Communication   Communication: No difficulties  Cognition Arousal/Alertness: Awake/alert Behavior During Therapy: WFL for tasks assessed/performed Overall Cognitive Status: Within Functional Limits for tasks assessed                                        General Comments General comments (skin integrity, edema, etc.): VSS throughout session.     Exercises General Exercises - Lower Extremity Ankle Circles/Pumps: AROM;Both;10 reps;Supine   Assessment/Plan    PT Assessment Patient needs continued PT services  PT Problem List Decreased strength;Decreased activity tolerance;Decreased balance;Decreased mobility;Decreased knowledge of use of DME;Decreased safety awareness;Cardiopulmonary status limiting activity;Obesity       PT Treatment Interventions DME instruction;Gait training;Stair training;Functional mobility training;Therapeutic activities;Therapeutic exercise;Balance training;Neuromuscular re-education;Patient/family education;Wheelchair mobility training    PT Goals (Current goals can be found in the Care Plan section)  Acute Rehab PT Goals Patient Stated Goal: rehab before home PT Goal Formulation: With patient Time For Goal Achievement: 02/09/17 Potential  to Achieve Goals: Good    Frequency Min 2X/week   Barriers to discharge Inaccessible home environment;Decreased caregiver support Steps to enter home and wife unable to provide physical assist    Co-evaluation               AM-PAC PT "6 Clicks" Daily Activity  Outcome Measure Difficulty turning over in bed (including adjusting bedclothes, sheets and blankets)?: Unable Difficulty moving from lying on back to sitting on the side of the bed? : Unable Difficulty sitting down on and standing up from a chair with arms (e.g., wheelchair, bedside commode, etc,.)?: Unable Help needed moving to and from a bed to chair (including a wheelchair)?: A Little Help needed walking in hospital room?: A Little Help needed climbing 3-5 steps with a railing? : A Lot 6 Click Score: 11    End of Session Equipment Utilized During Treatment: Gait belt;Oxygen Activity Tolerance: Patient tolerated treatment well;Patient limited by fatigue Patient left: in chair;with call bell/phone within reach;with family/visitor present Nurse Communication: Mobility status PT Visit Diagnosis: Muscle weakness (generalized) (M62.81);History of falling (Z91.81);Repeated falls (R29.6);Other abnormalities of gait and mobility (R26.89);Unsteadiness on feet (R26.81)    Time: 1441-1501 PT Time Calculation (min) (ACUTE ONLY): 20 min   Charges:   PT Evaluation $PT Eval Moderate Complexity: 1 Mod     PT G Codes:   PT G-Codes **NOT FOR INPATIENT CLASS** Functional Assessment Tool Used: AM-PAC 6 Clicks Basic Mobility;Clinical  judgement Functional Limitation: Mobility: Walking and moving around Mobility: Walking and Moving Around Current Status [phone]): At least 60 percent but less than 80 percent impaired, limited or restricted Mobility: Walking and Moving Around Goal Status [phone]): At least 20 percent but less than 40 percent impaired, limited or restricted    Collie Siad PT, DPT 01/26/2017, 4:46 PM

## 2017-01-26 NOTE — Progress Notes (Signed)
NAD No new complaints RN reports weakness with difficulty ambulating  Vitals:   01/26/17 0500 01/26/17 0519 01/26/17 0700 01/26/17 0747  BP:  114/66 122/78   Pulse:  99 (!) 101 96  Resp:  16 15 16   Temp:    98.4 F (36.9 C)  TempSrc:    Oral  SpO2:  91% 94% 95%  Weight: (!) 149 kg (328 lb 7.8 oz)     Height: 5\' 10"  (1.778 m)       Obese NAD HEENT WNL Chest clear  Regular, no M Soft, NABS Symmetric pretibial, ankle and pedal edema No focal neurologic deficits  BMP Latest Ref Rng & Units 01/26/2017 01/25/2017 01/24/2017  Glucose 65 - 99 mg/dL 144(H) 159(H) 153(H)  BUN 6 - 20 mg/dL 38(H) 72(H) 58(H)  Creatinine 0.61 - 1.24 mg/dL 4.42(H) 6.61(H) 5.43(H)  Sodium 135 - 145 mmol/L 134(L) 129(L) 132(L)  Potassium 3.5 - 5.1 mmol/L 3.1(L) 3.7 3.9  Chloride 101 - 111 mmol/L 101 101 103  CO2 22 - 32 mmol/L 22 17(L) 18(L)  Calcium 8.9 - 10.3 mg/dL 7.7(L) 8.0(L) 8.0(L)    CBC Latest Ref Rng & Units 01/26/2017 01/25/2017 01/24/2017  WBC 3.8 - 10.6 K/uL 6.4 7.2 7.2  Hemoglobin 13.0 - 18.0 g/dL 7.6(L) 8.8(L) 9.1(L)  Hematocrit 40.0 - 52.0 % 22.8(L) 26.4(L) 27.4(L)  Platelets 150 - 440 K/uL 137(L) 161 178    No new CXR  IMPRESSION: Hypotension, resolved Severe sepsis MSSA bacteremia Non-STEMI - remains on heparin infusion ESRD - S/P HD 12/04 Removal of PermCath 12/03 OSA/OHS, chronic CPAP Type 2 diabetes -adequately controlled  PLAN/REC: Transfer to [place] of major management decisions per ID, cardiology, nephrology, vascular surgery Continue nocturnal CPAP Continue supplemental oxygen as needed  After transfer, PCCM will sign off. Please call if we can be of further assistance  Merton Border, MD PCCM service Mobile [phone] Pager [phone] 01/26/2017 1:13 PM

## 2017-01-26 NOTE — Progress Notes (Signed)
ANTIBIOTIC CONSULT NOTE - INITIAL  Pharmacy Consult for cefazolin Indication: sepsis  Allergies  Allergen Reactions  . Naproxen Rash    Patient Measurements: Height: 5\' 10"  (177.8 cm) Weight: (!) 328 lb 7.8 oz (149 kg) IBW/kg (Calculated) : 73 Adjusted Body Weight: 101.8 kg  Vital Signs: Temp: 98.4 F (36.9 C) (12/05 0747) Temp Source: Oral (12/05 0747) BP: 122/78 (12/05 0700) Pulse Rate: 96 (12/05 0747) Intake/Output from previous day: 12/04 0701 - 12/05 0700 In: 459.2 [I.V.:459.2] Out: 20 [Stool:20] Intake/Output from this shift: No intake/output data recorded.  Labs: Recent Labs    01/24/17 0253 01/25/17 0517 01/26/17 0357  WBC 7.2 7.2 6.4  HGB 9.1* 8.8* 7.6*  PLT 178 161 137*  CREATININE 5.43* 6.61* 4.42*   Estimated Creatinine Clearance: 27 mL/min (A) (by C-G formula based on SCr of 4.42 mg/dL (H)). No results for input(s): VANCOTROUGH, VANCOPEAK, VANCORANDOM, GENTTROUGH, GENTPEAK, GENTRANDOM, TOBRATROUGH, TOBRAPEAK, TOBRARND, AMIKACINPEAK, AMIKACINTROU, AMIKACIN in the last 72 hours.   Microbiology: Recent Results (from the past 720 hour(s))  MRSA PCR Screening     Status: None   Collection Time: 12/30/16 12:36 AM  Result Value Ref Range Status   MRSA by PCR NEGATIVE NEGATIVE Final    Comment:        The GeneXpert MRSA Assay (FDA approved for NASAL specimens only), is one component of a comprehensive MRSA colonization surveillance program. It is not intended to diagnose MRSA infection nor to guide or monitor treatment for MRSA infections.   CULTURE, BLOOD (ROUTINE X 2) w Reflex to ID Panel     Status: Abnormal   Collection Time: 01/23/17 11:44 AM  Result Value Ref Range Status   Specimen Description BLOOD RIGHT ANTECUBITAL  Final   Special Requests   Final    BOTTLES DRAWN AEROBIC AND ANAEROBIC Blood Culture results may not be optimal due to an excessive volume of blood received in culture bottles   Culture  Setup Time   Final    GRAM POSITIVE  COCCI IN BOTH AEROBIC AND ANAEROBIC BOTTLES CRITICAL RESULT CALLED TO, READ BACK BY AND VERIFIED WITH: MATT MCBANE AT 01027 ON 01/24/17 [place].    Culture STAPHYLOCOCCUS AUREUS (A)  Final   Report Status 01/26/2017 FINAL  Final   Organism ID, Bacteria STAPHYLOCOCCUS AUREUS  Final      Susceptibility   Staphylococcus aureus - MIC*    CIPROFLOXACIN <=0.5 SENSITIVE Sensitive     ERYTHROMYCIN <=0.25 SENSITIVE Sensitive     GENTAMICIN <=0.5 SENSITIVE Sensitive     OXACILLIN <=0.25 SENSITIVE Sensitive     TETRACYCLINE <=1 SENSITIVE Sensitive     VANCOMYCIN <=0.5 SENSITIVE Sensitive     TRIMETH/SULFA <=10 SENSITIVE Sensitive     CLINDAMYCIN <=0.25 SENSITIVE Sensitive     RIFAMPIN <=0.5 SENSITIVE Sensitive     Inducible Clindamycin NEGATIVE Sensitive     * STAPHYLOCOCCUS AUREUS  CULTURE, BLOOD (ROUTINE X 2) w Reflex to ID Panel     Status: Abnormal   Collection Time: 01/23/17 11:44 AM  Result Value Ref Range Status   Specimen Description BLOOD BLOOD LEFT FOREARM  Final   Special Requests   Final    BOTTLES DRAWN AEROBIC AND ANAEROBIC Blood Culture results may not be optimal due to an excessive volume of blood received in culture bottles   Culture  Setup Time   Final    GRAM POSITIVE COCCI IN BOTH AEROBIC AND ANAEROBIC BOTTLES CRITICAL VALUE NOTED.  VALUE IS CONSISTENT WITH PREVIOUSLY REPORTED AND CALLED VALUE.  Culture (A)  Final    STAPHYLOCOCCUS AUREUS SUSCEPTIBILITIES PERFORMED ON PREVIOUS CULTURE WITHIN THE LAST 5 DAYS. Performed at [facility], [place] [address] [place], [place] [address]    Report Status 01/26/2017 FINAL  Final  Blood Culture ID Panel (Reflexed)     Status: Abnormal   Collection Time: 01/23/17 11:44 AM  Result Value Ref Range Status   Enterococcus species NOT DETECTED NOT DETECTED Final   Listeria monocytogenes NOT DETECTED NOT DETECTED Final   Staphylococcus species DETECTED (A) NOT DETECTED Final    Comment: CRITICAL RESULT CALLED TO, READ BACK BY AND  VERIFIED WITH: MATT MCBANE AT 0700 ON 01/24/17 [place].    Staphylococcus aureus DETECTED (A) NOT DETECTED Final    Comment: Methicillin (oxacillin) susceptible Staphylococcus aureus (MSSA). Preferred therapy is anti staphylococcal beta lactam antibiotic (Cefazolin or Nafcillin), unless clinically contraindicated. CRITICAL RESULT CALLED TO, READ BACK BY AND VERIFIED WITH: MATT MCBANE AT 0700 ON 01/24/17 [place].    Methicillin resistance NOT DETECTED NOT DETECTED Final   Streptococcus species NOT DETECTED NOT DETECTED Final   Streptococcus agalactiae NOT DETECTED NOT DETECTED Final   Streptococcus pneumoniae NOT DETECTED NOT DETECTED Final   Streptococcus pyogenes NOT DETECTED NOT DETECTED Final   Acinetobacter baumannii NOT DETECTED NOT DETECTED Final   Enterobacteriaceae species NOT DETECTED NOT DETECTED Final   Enterobacter cloacae complex NOT DETECTED NOT DETECTED Final   Escherichia coli NOT DETECTED NOT DETECTED Final   Klebsiella oxytoca NOT DETECTED NOT DETECTED Final   Klebsiella pneumoniae NOT DETECTED NOT DETECTED Final   Proteus species NOT DETECTED NOT DETECTED Final   Serratia marcescens NOT DETECTED NOT DETECTED Final   Haemophilus influenzae NOT DETECTED NOT DETECTED Final   Neisseria meningitidis NOT DETECTED NOT DETECTED Final   Pseudomonas aeruginosa NOT DETECTED NOT DETECTED Final   Candida albicans NOT DETECTED NOT DETECTED Final   Candida glabrata NOT DETECTED NOT DETECTED Final   Candida krusei NOT DETECTED NOT DETECTED Final   Candida parapsilosis NOT DETECTED NOT DETECTED Final   Candida tropicalis NOT DETECTED NOT DETECTED Final  C difficile quick scan w PCR reflex     Status: None   Collection Time: 01/23/17  1:24 PM  Result Value Ref Range Status   C Diff antigen NEGATIVE NEGATIVE Final   C Diff toxin NEGATIVE NEGATIVE Final   C Diff interpretation No C. difficile detected.  Final  MRSA PCR Screening     Status: None   Collection Time: 01/23/17  2:57 PM  Result  Value Ref Range Status   MRSA by PCR NEGATIVE NEGATIVE Final    Comment:        The GeneXpert MRSA Assay (FDA approved for NASAL specimens only), is one component of a comprehensive MRSA colonization surveillance program. It is not intended to diagnose MRSA infection nor to guide or monitor treatment for MRSA infections.   Culture, blood (single) w Reflex to ID Panel     Status: None (Preliminary result)   Collection Time: 01/24/17  4:34 PM  Result Value Ref Range Status   Specimen Description BLOOD BLOOD RIGHT HAND  Final   Special Requests   Final    BOTTLES DRAWN AEROBIC AND ANAEROBIC Blood Culture adequate volume   Culture NO GROWTH 2 DAYS  Final   Report Status PENDING  Incomplete  Cath Tip Culture     Status: None (Preliminary result)   Collection Time: 01/24/17  4:42 PM  Result Value Ref Range Status   Specimen Description  CATH TIP  Final   Special Requests NONE  Final   Culture   Final    >100,000 CFU STAPHYLOCOCCUS AUREUS SUSCEPTIBILITIES TO FOLLOW Performed at [facility], [place] [address], [place], [place] [address]    Report Status PENDING  Incomplete  Gastrointestinal Panel by PCR , Stool     Status: Abnormal   Collection Time: 01/24/17  9:23 PM  Result Value Ref Range Status   Campylobacter species NOT DETECTED NOT DETECTED Final   Plesimonas shigelloides NOT DETECTED NOT DETECTED Final   Salmonella species NOT DETECTED NOT DETECTED Final   Yersinia enterocolitica NOT DETECTED NOT DETECTED Final   Vibrio species NOT DETECTED NOT DETECTED Final   Vibrio cholerae NOT DETECTED NOT DETECTED Final   Enteroaggregative E coli (EAEC) NOT DETECTED NOT DETECTED Final   Enteropathogenic E coli (EPEC) DETECTED (A) NOT DETECTED Final    Comment: RESULT CALLED TO, READ BACK BY AND VERIFIED WITH: CHELSEA WRENN AT 0011 01/25/17 [place]    Enterotoxigenic E coli (ETEC) NOT DETECTED NOT DETECTED Final   Shiga like toxin producing E coli (STEC) NOT DETECTED NOT DETECTED  Final   Shigella/Enteroinvasive E coli (EIEC) NOT DETECTED NOT DETECTED Final   Cryptosporidium NOT DETECTED NOT DETECTED Final   Cyclospora cayetanensis NOT DETECTED NOT DETECTED Final   Entamoeba histolytica NOT DETECTED NOT DETECTED Final   Giardia lamblia NOT DETECTED NOT DETECTED Final   Adenovirus F40/41 NOT DETECTED NOT DETECTED Final   Astrovirus NOT DETECTED NOT DETECTED Final   Norovirus GI/GII NOT DETECTED NOT DETECTED Final   Rotavirus A NOT DETECTED NOT DETECTED Final   Sapovirus (I, II, IV, and V) NOT DETECTED NOT DETECTED Final    Medical History: Past Medical History:  Diagnosis Date  . CHF (congestive heart failure) ([place])   . COPD (chronic obstructive pulmonary disease) ([place])   . Diabetes mellitus type 2 in obese ([place])   . DM (diabetes mellitus) type II controlled with renal manifestation ([place])   . Hypercholesteremia   . Hypertension   . Morbid obesity with BMI of 45.0-49.9, adult ([place])   . Neuropathy   . Pancreatitis, acute   . Pneumonia   . Spinal stenosis     Medications:  Infusions:  . [START ON 01/27/2017] small volume/piggyback builder    . heparin 2,100 Units/hr (01/26/17 0500)   Assessment: 33 yom cc weakness/fall. Question PNA vs ADHF - stable on Bipap for admit to ICU. Pharmacy consulted to dose Zosyn and vancomycin for sepsis.   MSSA bacteremia  HD to begin 12/4 after vascath  Plan:  Transition to HD dosing of cefazolin 2 grams TThSa post-HD.  Ulice Dash, PharmD Clinical Pharmacist  01/26/2017,12:26 PM

## 2017-01-26 NOTE — Consult Note (Signed)
ANTICOAGULATION CONSULT NOTE - Initial Consult  Pharmacy Consult for heparin drip Indication: chest pain/ACS  Allergies  Allergen Reactions  . Naproxen Rash    Patient Measurements: Height: 5\' 10"  (177.8 cm) Weight: (!) 329 lb 9.4 oz (149.5 kg) IBW/kg (Calculated) : 73 Heparin Dosing Weight: 107.4kg  Vital Signs: Temp: 98.7 F (37.1 C) (12/05 0000) Temp Source: Oral (12/05 0000) BP: 121/74 (12/05 0200) Pulse Rate: 97 (12/05 0200)  Labs: Recent Labs    01/23/17 1144  01/24/17 0253 01/24/17 0921 01/24/17 1502 01/24/17 2359 01/25/17 0517 01/25/17 1119 01/26/17 0150  HGB 9.9*  --  9.1*  --   --   --  8.8*  --   --   HCT 30.2*  --  27.4*  --   --   --  26.4*  --   --   PLT 218  --  178  --   --   --  161  --   --   APTT 37*  --   --   --   --   --   --   --   --   LABPROT 15.9*  --   --   --   --   --   --   --   --   INR 1.28  --   --   --   --   --   --   --   --   HEPARINUNFRC  --    < >  --  0.22*  --  0.18* 0.39 0.20* 0.11*  CREATININE 4.92*  --  5.43*  --   --   --  6.61*  --   --   TROPONINI >65.00*   < > 52.89* 36.73* 30.22* 19.35*  --   --   --    < > = values in this interval not displayed.    Estimated Creatinine Clearance: 18.1 mL/min (A) (by C-G formula based on SCr of 6.61 mg/dL (H)).   Medical History: Past Medical History:  Diagnosis Date  . CHF (congestive heart failure) ([place])   . COPD (chronic obstructive pulmonary disease) ([place])   . Diabetes mellitus type 2 in obese ([place])   . DM (diabetes mellitus) type II controlled with renal manifestation ([place])   . Hypercholesteremia   . Hypertension   . Morbid obesity with BMI of 45.0-49.9, adult ([place])   . Neuropathy   . Pancreatitis, acute   . Pneumonia   . Spinal stenosis     Medications:  Scheduled:  . aspirin EC  81 mg Oral Daily  . atorvastatin  80 mg Oral Daily  . epoetin (EPOGEN/PROCRIT) injection  4,000 Units Intravenous Q T,Th,Sa-HD  . gabapentin  100 mg Oral QHS  . heparin  3,000 Units  Intravenous Once  . heparin  4,000 Units Intracatheter Once  . insulin aspart  0-5 Units Subcutaneous QHS  . insulin aspart  0-9 Units Subcutaneous TID WC  . levothyroxine  50 mcg Oral QAC breakfast  . sertraline  25 mg Oral Daily  . sodium chloride flush  10-40 mL Intracatheter Q12H    Assessment: Pt is a 56 year old obese male who was brought in by EMS with generalized weakness, hypotension. Pt found to have a troponin of >65. Pharmacy consulted to dose heparin drip with no bolus- per Dr. Margaretmary Eddy.   Goal of Therapy:  Heparin level 0.3-0.7 units/ml Monitor platelets by anticoagulation protocol: Yes   Plan:  Heparin restarted around 1910 at  2000 units/hr. Will check level in about 6 hours.  12/4 @ 0200 HL 0.11 subtherapeutic. Will rebolus w/ heparin 3000 units IV x 1 and will increase rate to 2100 units/hr and will recheck HL @ 0800 and CBC w/ am labs.  Tobie Lords, PharmD, BCPS Clinical Pharmacist 01/26/2017

## 2017-01-27 LAB — BASIC METABOLIC PANEL
Anion gap: 12 (ref 5–15)
BUN: 45 mg/dL — AB (ref 6–20)
CHLORIDE: 100 mmol/L — AB (ref 101–111)
CO2: 22 mmol/L (ref 22–32)
CREATININE: 5.53 mg/dL — AB (ref 0.61–1.24)
Calcium: 8.1 mg/dL — ABNORMAL LOW (ref 8.9–10.3)
GFR calc Af Amer: 12 mL/min — ABNORMAL LOW (ref >60)
GFR calc non Af Amer: 10 mL/min — ABNORMAL LOW (ref >60)
GLUCOSE: 134 mg/dL — AB (ref 65–99)
POTASSIUM: 3.1 mmol/L — AB (ref 3.5–5.1)
Sodium: 134 mmol/L — ABNORMAL LOW (ref 135–145)

## 2017-01-27 LAB — CBC
HEMATOCRIT: 25.4 % — AB (ref 40.0–52.0)
Hemoglobin: 8.5 g/dL — ABNORMAL LOW (ref 13.0–18.0)
MCH: 29.8 pg (ref 26.0–34.0)
MCHC: 33.2 g/dL (ref 32.0–36.0)
MCV: 89.7 fL (ref 80.0–100.0)
Platelets: 157 10*3/uL (ref 150–440)
RBC: 2.84 MIL/uL — ABNORMAL LOW (ref 4.40–5.90)
RDW: 15.1 % — AB (ref 11.5–14.5)
WBC: 8.9 10*3/uL (ref 3.8–10.6)

## 2017-01-27 LAB — GLUCOSE, CAPILLARY
GLUCOSE-CAPILLARY: 128 mg/dL — AB (ref 65–99)
Glucose-Capillary: 138 mg/dL — ABNORMAL HIGH (ref 65–99)
Glucose-Capillary: 127 mg/dL — ABNORMAL HIGH (ref 65–99)
Glucose-Capillary: 129 mg/dL — ABNORMAL HIGH (ref 65–99)

## 2017-01-27 LAB — HEPARIN LEVEL (UNFRACTIONATED)
Heparin Unfractionated: 0.25 IU/mL — ABNORMAL LOW (ref 0.30–0.70)
Heparin Unfractionated: 0.23 IU/mL — ABNORMAL LOW (ref 0.30–0.70)

## 2017-01-27 LAB — NOROVIRUS GROUP 1 & 2 BY PCR, STOOL
NOROVIRUS 1 BY PCR: NEGATIVE
NOROVIRUS 2 BY PCR: NEGATIVE

## 2017-01-27 LAB — MAGNESIUM: Magnesium: 1.9 mg/dL (ref 1.7–2.4)

## 2017-01-27 LAB — PHOSPHORUS: Phosphorus: 4.9 mg/dL — ABNORMAL HIGH (ref 2.5–4.6)

## 2017-01-27 MED ORDER — DEXTROSE 5 % IV SOLN
1.0000 g | INTRAVENOUS | Status: AC
  Filled 2017-01-27: qty 10

## 2017-01-27 MED ORDER — DEXTROSE 5 % IV SOLN
2.0000 g | INTRAVENOUS | Status: DC
  Administered 2017-01-27 – 2017-01-29 (×2): 2 g via INTRAVENOUS
  Filled 2017-01-27 (×4): qty 20

## 2017-01-27 MED ORDER — HEPARIN BOLUS VIA INFUSION
1500.0000 [IU] | Freq: Once | INTRAVENOUS | Status: AC
  Administered 2017-01-27: 1500 [IU] via INTRAVENOUS
  Filled 2017-01-27: qty 1500

## 2017-01-27 NOTE — Progress Notes (Signed)
ANTICOAGULATION CONSULT NOTE - Follow Up Consult  Pharmacy Consult for Heparin  Indication: chest pain/ACS  Allergies  Allergen Reactions  . Naproxen Rash    Patient Measurements: Height: 5\' 10"  (177.8 cm) Weight: (!) 330 lb 1.6 oz (149.7 kg) IBW/kg (Calculated) : 73 Heparin Dosing Weight: 107.4 kg   Vital Signs: Temp: 98.6 F (37 C) (12/06 1345) Temp Source: Oral (12/06 1345) BP: 167/87 (12/06 1530) Pulse Rate: 80 (12/06 1530)  Labs: Recent Labs    01/24/17 2359  01/25/17 0517  01/26/17 0357  01/26/17 1959 01/27/17 0516 01/27/17 1411  HGB  --    < > 8.8*  --  7.6*  --   --  8.5*  --   HCT  --   --  26.4*  --  22.8*  --   --  25.4*  --   PLT  --   --  161  --  137*  --   --  157  --   HEPARINUNFRC 0.18*  --  0.39   < >  --    < > 0.24* 0.25* 0.23*  CREATININE  --   --  6.61*  --  4.42*  --   --  5.53*  --   TROPONINI 19.35*  --   --   --   --   --   --   --   --    < > = values in this interval not displayed.    Estimated Creatinine Clearance: 21.6 mL/min (A) (by C-G formula based on SCr of 5.53 mg/dL (H)).   Medications:  Scheduled:  . aspirin EC  81 mg Oral Daily  . atorvastatin  80 mg Oral Daily  . epoetin (EPOGEN/PROCRIT) injection  4,000 Units Intravenous Q T,Th,Sa-HD  . gabapentin  100 mg Oral QHS  . heparin  4,000 Units Intracatheter Once  . insulin aspart  0-5 Units Subcutaneous QHS  . insulin aspart  0-9 Units Subcutaneous TID WC  . levothyroxine  50 mcg Oral QAC breakfast  . sertraline  25 mg Oral Daily  . sodium chloride flush  10-40 mL Intracatheter Q12H    Assessment: Pt is a 56 year old obese male who was brought in by EMS with generalized weakness, hypotension. Pt found to have a troponin of >65. Pharmacy consulted to dose heparin drip with no bolus- per Dr. Margaretmary Eddy.      Goal of Therapy:  Heparin level 0.3-0.7 units/ml Monitor platelets by anticoagulation protocol: Yes   Plan:  12/5:  HL @ 20:00 = 0.24 Will order Heparin 1600 units  IV X 1 and increase drip rate to 2500 units/hr.  Will recheck HL 8 hrs after rate change.   12/6 @ 0500 HL 0.25 subtherapeutic. Will rebolus w/ heparin 1500 units IV x 1 and increase rate to 2600 units/hr and recheck HL @ 1100  12/6 at 1411 HL 0.23 subtherapeutic. Will increase to heparin 2750 units/hr and recheck in 8h. CBC in AM. Pt with consistently low HL despite increasing drip rate. Will order antithrombin III level (to be followed up in AM) to assess if that is a possible mechanism for heparin resistance.   Rayna Sexton, PharmD, BCPS Clinical Pharmacist 01/27/2017 3:49 PM

## 2017-01-27 NOTE — Progress Notes (Signed)
Refused bed alarm.  

## 2017-01-27 NOTE — Progress Notes (Signed)
[facility], [place] 01/27/17  Subjective:  Patient is doing fair Denies acute C/O Blood cultures are positive for staph aureus (MSSA) Patient is able to eat or drink without nausea vomiting He has some lower extremity edema   Objective:  Vital signs in last 24 hours:  Temp:  [97.6 F (36.4 C)-98.6 F (37 C)] 98.6 F (37 C) (12/06 1345) Pulse Rate:  [77-106] 106 (12/06 1628) Resp:  [12-24] 18 (12/06 1628) BP: (95-193)/(61-92) 177/80 (12/06 1628) SpO2:  [89 %-100 %] 95 % (12/06 1640) Weight:  [149.7 kg (330 lb 1.6 oz)-150.5 kg (331 lb 12.7 oz)] 149.7 kg (330 lb 1.6 oz) (12/06 1345)  Weight change: 1 kg (2 lb 3.3 oz) Filed Weights   01/27/17 0500 01/27/17 0652 01/27/17 1345  Weight: (!) 150.5 kg (331 lb 12.7 oz) (!) 149.7 kg (330 lb 1.6 oz) (!) 149.7 kg (330 lb 1.6 oz)    Intake/Output:    Intake/Output Summary (Last 24 hours) at 01/27/2017 1643 Last data filed at 01/27/2017 0500 Gross per 24 hour  Intake 168.27 ml  Output 100 ml  Net 68.27 ml     Physical Exam: General:  Lying in the bed  HEENT Anicteric, moist mucus membranes  Neck Supple,   Pulm/lungs  mild scattered rhonchi, otherwise clear  CVS/Heart irregular  Abdomen:  Soft, NT,   Extremities: + dependent edema  Neurologic: Alert, oteinted     Access: Rt IJ temp cath       Basic Metabolic Panel:  Recent Labs  Lab 01/23/17 1144 01/23/17 2057 01/24/17 0253 01/25/17 0517 01/26/17 0357 01/27/17 0516  NA 132*  --  132* 129* 134* 134*  K 4.3  --  3.9 3.7 3.1* 3.1*  CL 100*  --  103 101 101 100*  CO2 20*  --  18* 17* 22 22  GLUCOSE 182*  --  153* 159* 144* 134*  BUN 52*  --  58* 72* 38* 45*  CREATININE 4.92*  --  5.43* 6.61* 4.42* 5.53*  CALCIUM 8.5*  --  8.0* 8.0* 7.7* 8.1*  MG  --  1.7  --  2.0 1.8 1.9  PHOS  --  3.8  --  5.3* 4.4 4.9*     CBC: Recent Labs  Lab 01/23/17 1144 01/24/17 0253 01/25/17 0517 01/26/17 0357 01/27/17 0516  WBC 9.9 7.2 7.2 6.4 8.9   NEUTROABS 8.5*  --   --   --   --   HGB 9.9* 9.1* 8.8* 7.6* 8.5*  HCT 30.2* 27.4* 26.4* 22.8* 25.4*  MCV 91.5 91.0 90.3 89.4 89.7  PLT 218 178 161 137* 157      Lab Results  Component Value Date   HEPBSAG Negative 12/31/2016   HEPBSAB Non Reactive 12/31/2016      Microbiology:  Recent Results (from the past 240 hour(s))  CULTURE, BLOOD (ROUTINE X 2) w Reflex to ID Panel     Status: Abnormal   Collection Time: 01/23/17 11:44 AM  Result Value Ref Range Status   Specimen Description BLOOD RIGHT ANTECUBITAL  Final   Special Requests   Final    BOTTLES DRAWN AEROBIC AND ANAEROBIC Blood Culture results may not be optimal due to an excessive volume of blood received in culture bottles   Culture  Setup Time   Final    GRAM POSITIVE COCCI IN BOTH AEROBIC AND ANAEROBIC BOTTLES CRITICAL RESULT CALLED TO, READ BACK BY AND VERIFIED WITH: MATT MCBANE AT [name] ON 01/24/17 [place].  Culture STAPHYLOCOCCUS AUREUS (A)  Final   Report Status 01/26/2017 FINAL  Final   Organism ID, Bacteria STAPHYLOCOCCUS AUREUS  Final      Susceptibility   Staphylococcus aureus - MIC*    CIPROFLOXACIN <=0.5 SENSITIVE Sensitive     ERYTHROMYCIN <=0.25 SENSITIVE Sensitive     GENTAMICIN <=0.5 SENSITIVE Sensitive     OXACILLIN <=0.25 SENSITIVE Sensitive     TETRACYCLINE <=1 SENSITIVE Sensitive     VANCOMYCIN <=0.5 SENSITIVE Sensitive     TRIMETH/SULFA <=10 SENSITIVE Sensitive     CLINDAMYCIN <=0.25 SENSITIVE Sensitive     RIFAMPIN <=0.5 SENSITIVE Sensitive     Inducible Clindamycin NEGATIVE Sensitive     * STAPHYLOCOCCUS AUREUS  CULTURE, BLOOD (ROUTINE X 2) w Reflex to ID Panel     Status: Abnormal   Collection Time: 01/23/17 11:44 AM  Result Value Ref Range Status   Specimen Description BLOOD BLOOD LEFT FOREARM  Final   Special Requests   Final    BOTTLES DRAWN AEROBIC AND ANAEROBIC Blood Culture results may not be optimal due to an excessive volume of blood received in culture bottles   Culture  Setup  Time   Final    GRAM POSITIVE COCCI IN BOTH AEROBIC AND ANAEROBIC BOTTLES CRITICAL VALUE NOTED.  VALUE IS CONSISTENT WITH PREVIOUSLY REPORTED AND CALLED VALUE.    Culture (A)  Final    STAPHYLOCOCCUS AUREUS SUSCEPTIBILITIES PERFORMED ON PREVIOUS CULTURE WITHIN THE LAST 5 DAYS. Performed at [facility], [place] [address], [place], [place] [address]    Report Status 01/26/2017 FINAL  Final  Blood Culture ID Panel (Reflexed)     Status: Abnormal   Collection Time: 01/23/17 11:44 AM  Result Value Ref Range Status   Enterococcus species NOT DETECTED NOT DETECTED Final   Listeria monocytogenes NOT DETECTED NOT DETECTED Final   Staphylococcus species DETECTED (A) NOT DETECTED Final    Comment: CRITICAL RESULT CALLED TO, READ BACK BY AND VERIFIED WITH: MATT MCBANE AT 0700 ON 01/24/17 [place].    Staphylococcus aureus DETECTED (A) NOT DETECTED Final    Comment: Methicillin (oxacillin) susceptible Staphylococcus aureus (MSSA). Preferred therapy is anti staphylococcal beta lactam antibiotic (Cefazolin or Nafcillin), unless clinically contraindicated. CRITICAL RESULT CALLED TO, READ BACK BY AND VERIFIED WITH: MATT MCBANE AT 0700 ON 01/24/17 [place].    Methicillin resistance NOT DETECTED NOT DETECTED Final   Streptococcus species NOT DETECTED NOT DETECTED Final   Streptococcus agalactiae NOT DETECTED NOT DETECTED Final   Streptococcus pneumoniae NOT DETECTED NOT DETECTED Final   Streptococcus pyogenes NOT DETECTED NOT DETECTED Final   Acinetobacter baumannii NOT DETECTED NOT DETECTED Final   Enterobacteriaceae species NOT DETECTED NOT DETECTED Final   Enterobacter cloacae complex NOT DETECTED NOT DETECTED Final   Escherichia coli NOT DETECTED NOT DETECTED Final   Klebsiella oxytoca NOT DETECTED NOT DETECTED Final   Klebsiella pneumoniae NOT DETECTED NOT DETECTED Final   Proteus species NOT DETECTED NOT DETECTED Final   Serratia marcescens NOT DETECTED NOT DETECTED Final   Haemophilus influenzae  NOT DETECTED NOT DETECTED Final   Neisseria meningitidis NOT DETECTED NOT DETECTED Final   Pseudomonas aeruginosa NOT DETECTED NOT DETECTED Final   Candida albicans NOT DETECTED NOT DETECTED Final   Candida glabrata NOT DETECTED NOT DETECTED Final   Candida krusei NOT DETECTED NOT DETECTED Final   Candida parapsilosis NOT DETECTED NOT DETECTED Final   Candida tropicalis NOT DETECTED NOT DETECTED Final  C difficile quick scan w PCR reflex  Status: None   Collection Time: 01/23/17  1:24 PM  Result Value Ref Range Status   C Diff antigen NEGATIVE NEGATIVE Final   C Diff toxin NEGATIVE NEGATIVE Final   C Diff interpretation No C. difficile detected.  Final  MRSA PCR Screening     Status: None   Collection Time: 01/23/17  2:57 PM  Result Value Ref Range Status   MRSA by PCR NEGATIVE NEGATIVE Final    Comment:        The GeneXpert MRSA Assay (FDA approved for NASAL specimens only), is one component of a comprehensive MRSA colonization surveillance program. It is not intended to diagnose MRSA infection nor to guide or monitor treatment for MRSA infections.   Culture, blood (single) w Reflex to ID Panel     Status: None (Preliminary result)   Collection Time: 01/24/17  4:34 PM  Result Value Ref Range Status   Specimen Description BLOOD BLOOD RIGHT HAND  Final   Special Requests   Final    BOTTLES DRAWN AEROBIC AND ANAEROBIC Blood Culture adequate volume   Culture NO GROWTH 3 DAYS  Final   Report Status PENDING  Incomplete  Cath Tip Culture     Status: None (Preliminary result)   Collection Time: 01/24/17  4:42 PM  Result Value Ref Range Status   Specimen Description CATH TIP  Final   Special Requests NONE  Final   Culture   Final    >100,000 CFU STAPHYLOCOCCUS AUREUS SUSCEPTIBILITIES TO FOLLOW Performed at [facility], [place] [address], [place], [place] [address]    Report Status PENDING  Incomplete  Gastrointestinal Panel by PCR , Stool     Status: Abnormal    Collection Time: 01/24/17  9:23 PM  Result Value Ref Range Status   Campylobacter species NOT DETECTED NOT DETECTED Final   Plesimonas shigelloides NOT DETECTED NOT DETECTED Final   Salmonella species NOT DETECTED NOT DETECTED Final   Yersinia enterocolitica NOT DETECTED NOT DETECTED Final   Vibrio species NOT DETECTED NOT DETECTED Final   Vibrio cholerae NOT DETECTED NOT DETECTED Final   Enteroaggregative E coli (EAEC) NOT DETECTED NOT DETECTED Final   Enteropathogenic E coli (EPEC) DETECTED (A) NOT DETECTED Final    Comment: RESULT CALLED TO, READ BACK BY AND VERIFIED WITH: CHELSEA WRENN AT 0011 01/25/17 [place]    Enterotoxigenic E coli (ETEC) NOT DETECTED NOT DETECTED Final   Shiga like toxin producing E coli (STEC) NOT DETECTED NOT DETECTED Final   Shigella/Enteroinvasive E coli (EIEC) NOT DETECTED NOT DETECTED Final   Cryptosporidium NOT DETECTED NOT DETECTED Final   Cyclospora cayetanensis NOT DETECTED NOT DETECTED Final   Entamoeba histolytica NOT DETECTED NOT DETECTED Final   Giardia lamblia NOT DETECTED NOT DETECTED Final   Adenovirus F40/41 NOT DETECTED NOT DETECTED Final   Astrovirus NOT DETECTED NOT DETECTED Final   Norovirus GI/GII NOT DETECTED NOT DETECTED Final   Rotavirus A NOT DETECTED NOT DETECTED Final   Sapovirus (I, II, IV, and V) NOT DETECTED NOT DETECTED Final    Coagulation Studies: No results for input(s): LABPROT, INR in the last 72 hours.  Urinalysis: No results for input(s): COLORURINE, LABSPEC, PHURINE, GLUCOSEU, HGBUR, BILIRUBINUR, KETONESUR, PROTEINUR, UROBILINOGEN, NITRITE, LEUKOCYTESUR in the last 72 hours.  Invalid input(s): APPERANCEUR    Imaging: Dg Chest 1 View  Result Date: 01/25/2017 CLINICAL DATA:  Central line placed EXAM: CHEST 1 VIEW COMPARISON:  Portable chest x-ray of 01/23/2017 FINDINGS: The tip of the left IJ central  venous line is at the approximate junction of the left innominate vein and SVC. No pneumothorax is seen. There is  cardiomegaly present with probable mild pulmonary vascular congestion. No pleural effusion is seen. Right central venous line tip is unchanged overlying the mid SVC. IMPRESSION: 1. Left central venous line tip overlies the junction of the left innominate vein and SVC. 2. Cardiomegaly and mild pulmonary vascular congestion. 3. Right central venous line tip overlies the mid SVC. Electronically Signed   By: Ivar Drape M.D.   On: 01/25/2017 16:53     Medications:   .  ceFAZolin (ANCEF) IV    .  ceFAZolin (ANCEF) IV    . heparin 2,750 Units/hr (01/27/17 1626)   . aspirin EC  81 mg Oral Daily  . atorvastatin  80 mg Oral Daily  . epoetin (EPOGEN/PROCRIT) injection  4,000 Units Intravenous Q T,Th,Sa-HD  . gabapentin  100 mg Oral QHS  . heparin  4,000 Units Intracatheter Once  . insulin aspart  0-5 Units Subcutaneous QHS  . insulin aspart  0-9 Units Subcutaneous TID WC  . levothyroxine  50 mcg Oral QAC breakfast  . sertraline  25 mg Oral Daily  . sodium chloride flush  10-40 mL Intracatheter Q12H   acetaminophen, albuterol, nitroGLYCERIN, ondansetron (ZOFRAN) IV, oxyCODONE, sodium chloride flush  Assessment/ Plan:  56 y.o. caucasian male with end stage renal disease on hemodialysis, diabetes mellitus type II insulin dependent, diabetic peripheral neuropathy, right toe amputation, hypertension, hyperlipidemia, COPD/tobacco use, depression, coronary artery disease  TTS [facility] RIJ permcath  1. ESRD 2, Sepsis, Staph aureus (MSSA). Cath tip culture is also POS for S Aureus 3. Acute coronary syndrome/ NSTEMI/ Followed by Dr Ubaldo Glassing 4. Edema 5. AoCKD 6. SHPTH  Plan: Tunnel dialysis catheter has been removed.  Temporary dialysis catheter in place Antibiotics as per ICU and ID team; currently getting cefazolin Plan for dialysis today and Saturday and then remove the dialysis catheter. PermCath to be placed on Monday EPO with HD ACS, [facility] cardiology following Plan for heart  catheterization once sepsis is resolved 2 D echo shows EF 50-55%. ID recommending TEE Phos 4.9, monitor    LOS: 4 [employer] 12/6/20184:43 PM  [place], [place]

## 2017-01-27 NOTE — Progress Notes (Signed)
HD tx end  

## 2017-01-27 NOTE — Progress Notes (Signed)
ANTICOAGULATION CONSULT NOTE - Follow Up Consult  Pharmacy Consult for Heparin  Indication: chest pain/ACS  Allergies  Allergen Reactions  . Naproxen Rash    Patient Measurements: Height: 5\' 10"  (177.8 cm) Weight: (!) 331 lb 12.7 oz (150.5 kg) IBW/kg (Calculated) : 73 Heparin Dosing Weight: 107.4 kg   Vital Signs: Temp: 98.4 F (36.9 C) (12/05 2000) Temp Source: Oral (12/05 2000) BP: 113/69 (12/06 0500) Pulse Rate: 87 (12/06 0500)  Labs: Recent Labs    01/24/17 0921 01/24/17 1502 01/24/17 2359  01/25/17 0517  01/26/17 0357 01/26/17 0830 01/26/17 1959 01/27/17 0516  HGB  --   --   --    < > 8.8*  --  7.6*  --   --  8.5*  HCT  --   --   --   --  26.4*  --  22.8*  --   --  25.4*  PLT  --   --   --   --  161  --  137*  --   --  157  HEPARINUNFRC 0.22*  --  0.18*  --  0.39   < >  --  0.23* 0.24* 0.25*  CREATININE  --   --   --   --  6.61*  --  4.42*  --   --  5.53*  TROPONINI 36.73* 30.22* 19.35*  --   --   --   --   --   --   --    < > = values in this interval not displayed.    Estimated Creatinine Clearance: 21.7 mL/min (A) (by C-G formula based on SCr of 5.53 mg/dL (H)).   Medications:  Scheduled:  . aspirin EC  81 mg Oral Daily  . atorvastatin  80 mg Oral Daily  . epoetin (EPOGEN/PROCRIT) injection  4,000 Units Intravenous Q T,Th,Sa-HD  . gabapentin  100 mg Oral QHS  . heparin  1,500 Units Intravenous Once  . heparin  4,000 Units Intracatheter Once  . insulin aspart  0-5 Units Subcutaneous QHS  . insulin aspart  0-9 Units Subcutaneous TID WC  . levothyroxine  50 mcg Oral QAC breakfast  . sertraline  25 mg Oral Daily  . sodium chloride flush  10-40 mL Intracatheter Q12H    Assessment: Pt is a 56 year old obese male who was brought in by EMS with generalized weakness, hypotension. Pt found to have a troponin of >65. Pharmacy consulted to dose heparin drip with no bolus- per Dr. Margaretmary Eddy.      Goal of Therapy:  Heparin level 0.3-0.7 units/ml Monitor  platelets by anticoagulation protocol: Yes   Plan:  12/5:  HL @ 20:00 = 0.24 Will order Heparin 1600 units IV X 1 and increase drip rate to 2500 units/hr.  Will recheck HL 8 hrs after rate change.   12/6 @ 0500 HL 0.25 subtherapeutic. Will rebolus w/ heparin 1500 units IV x 1 and increase rate to 2600 units/hr and recheck HL @ [place], PharmD, BCPS Clinical Pharmacist 01/27/2017

## 2017-01-27 NOTE — Progress Notes (Addendum)
[place] [facility] [place] NAME: Brian [name]    MR#:  [id number]  DATE OF BIRTH:  June 19, 1960  SUBJECTIVE:  CHIEF COMPLAINT:   Chief Complaint  Patient presents with  . Weakness   Generalized weakness and better diarrhea.  Hypotension improved. REVIEW OF SYSTEMS:  Review of Systems  Constitutional: Positive for malaise/fatigue. Negative for chills and fever.  HENT: Negative for sore throat.   Eyes: Negative for blurred vision and double vision.  Respiratory: Negative for cough, hemoptysis, shortness of breath, wheezing and stridor.   Cardiovascular: Negative for chest pain, palpitations, orthopnea and leg swelling.  Gastrointestinal: Positive for diarrhea. Negative for abdominal pain, blood in stool, melena, nausea and vomiting.  Genitourinary: Negative for dysuria, flank pain and hematuria.  Musculoskeletal: Negative for back pain and joint pain.  Neurological: Positive for weakness. Negative for dizziness, sensory change, focal weakness, seizures, loss of consciousness and headaches.  Endo/Heme/Allergies: Negative for polydipsia.  Psychiatric/Behavioral: Negative for depression. The patient is not nervous/anxious.     DRUG ALLERGIES:   Allergies  Allergen Reactions  . Naproxen Rash   VITALS:  Blood pressure (!) 156/76, pulse 78, temperature 98.6 F (37 C), temperature source Oral, resp. rate 16, height 5\' 10"  (1.778 m), weight (!) 330 lb 1.6 oz (149.7 kg), SpO2 100 %. PHYSICAL EXAMINATION:  Physical Exam  Constitutional: He is oriented to person, place, and time and well-developed, well-nourished, and in no distress.  Morbid obese  HENT:  Head: Normocephalic.  Eyes: Conjunctivae and EOM are normal. Pupils are equal, round, and reactive to light. No scleral icterus.  Neck: Normal range of motion. Neck supple. No JVD present. No tracheal deviation present.  Cardiovascular: Normal rate, regular rhythm and normal heart sounds. Exam  reveals no gallop.  No murmur heard. Pulmonary/Chest: Effort normal and breath sounds normal. No respiratory distress. He has no wheezes. He has no rales.  Abdominal: Soft. Bowel sounds are normal. He exhibits no distension. There is no tenderness. There is no rebound.  Musculoskeletal: Normal range of motion. He exhibits no edema or tenderness.  Neurological: He is alert and oriented to person, place, and time. No cranial nerve deficit.  Skin: No rash noted. No erythema.  Psychiatric: Affect normal.   LABORATORY PANEL:  Male CBC Recent Labs  Lab 01/27/17 0516  WBC 8.9  HGB 8.5*  HCT 25.4*  PLT 157   ------------------------------------------------------------------------------------------------------------------ Chemistries  Recent Labs  Lab 01/23/17 1144  01/27/17 0516  NA 132*   < > 134*  K 4.3   < > 3.1*  CL 100*   < > 100*  CO2 20*   < > 22  GLUCOSE 182*   < > 134*  BUN 52*   < > 45*  CREATININE 4.92*   < > 5.53*  CALCIUM 8.5*   < > 8.1*  MG  --    < > 1.9  AST 119*  --   --   ALT 30  --   --   ALKPHOS 68  --   --   BILITOT 0.4  --   --    < > = values in this interval not displayed.   RADIOLOGY:  No results found. ASSESSMENT AND PLAN:   Brian [name]  is a 56 y.o. male with a known history of COPD, chronic diastolic congestive heart failure, end-stage renal disease recently started on hemodialysis was just admitted to hospital on 12/29/2016 and discharged on 01/06/2017 with CHF exacerbation  is presenting to the ED with a chief complaint of generalized weakness. By the time he came into the hospital he is hypotensive. Patient's initial troponin is at 65 EKG with no ST elevations.  #Non-STEMI Continue heparin drip, ASA and atorvastatin, echo is unremarkable. Per Dr. Ubaldo Glassing, consider cath when septic picture improves to evaluate anatomy. Not candidate for beta blockers and ace I given hemodynamics.   #Septic shock with MSSA bacteremia, due to permacath  infection. Off Levophed drip.  Blood cultures show positive MSSA. He was on broad-spectrum IV antibiotic Zosyn and vancomycin.  Per Dr. Ola Spurr, changed to ancef, Min 2 weeks IV abx will be needed.  TEE if TTE neg for vegetations.  Removed permcath.  NO GROWTH 3 DAYS per repeated blood culture. Permacath on Monday.  ESRD. Temporary dialysis cathter placed and HD as scheduled per Dr. Candiss Norse.  Hyponatremia.  Improved.  Diarrhea. Negative C. difficile test. Enteropathogenic E coli (EPEC)  #Chronic diastolic congestive heart failure without fluid overload  #Chronic history of obstructive sleep apnea Patient is noncompliant with CPAP machine Continue oxygen via nasal cannula  Anemia of chronic disease.  Stable.  EPO with HD.  Hypokalemia.  Adjust potassium during dialysis.  Discussed with Dr. Candiss Norse and Dr. Ubaldo Glassing. All the records are reviewed and case discussed with Care Management/Social Worker. Management plans discussed with the patient, his wife and they are in agreement.  CODE STATUS: Full Code  TOTAL TIME TAKING CARE OF THIS PATIENT: 36 minutes.   More than 50% of the time was spent in counseling/coordination of care: YES  POSSIBLE D/C IN 2 DAYS, DEPENDING ON CLINICAL CONDITION.   Demetrios Loll M.D on 01/27/2017 at 3:10 PM  Between 7am to 6pm - Pager - [phone]  After 6pm go to [email or web address] - [facility]

## 2017-01-27 NOTE — Progress Notes (Signed)
Post HD assessment  

## 2017-01-27 NOTE — Progress Notes (Signed)
[facility] INFECTIOUS DISEASE PROGRESS NOTE Date of Admission:  01/23/2017     ID: ILIR MAHRT is a 56 y.o. male with MSSA bacteremia Active Problems:   NSTEMI (non-ST elevated myocardial infarction) ([place])   Subjective: No fevers, transferred out of unit. Still with rectal tube  ROS  Eleven systems are reviewed and negative except per hpi  Medications:  Antibiotics Given (last 72 hours)    Date/Time Action Medication   01/24/17 1705 New Bag/Given   ceFAZolin (ANCEF) 2 g in dextrose 5 % 100 mL injection   01/25/17 0525 New Bag/Given   ceFAZolin (ANCEF) 2 g in dextrose 5 % 100 mL injection   01/25/17 1958 New Bag/Given   ceFAZolin (ANCEF) 1 g in dextrose 5 % 50 mL injection     . aspirin EC  81 mg Oral Daily  . atorvastatin  80 mg Oral Daily  . epoetin (EPOGEN/PROCRIT) injection  4,000 Units Intravenous Q T,Th,Sa-HD  . gabapentin  100 mg Oral QHS  . heparin  4,000 Units Intracatheter Once  . insulin aspart  0-5 Units Subcutaneous QHS  . insulin aspart  0-9 Units Subcutaneous TID WC  . levothyroxine  50 mcg Oral QAC breakfast  . sertraline  25 mg Oral Daily  . sodium chloride flush  10-40 mL Intracatheter Q12H    Objective: Vital signs in last 24 hours: Temp:  [97.6 F (36.4 C)-98.6 F (37 C)] 98.6 F (37 C) (12/06 1345) Pulse Rate:  [77-106] 106 (12/06 1628) Resp:  [12-24] 18 (12/06 1628) BP: (95-193)/(61-92) 177/80 (12/06 1628) SpO2:  [89 %-100 %] 95 % (12/06 1640) Weight:  [149.7 kg (330 lb 1.6 oz)-150.5 kg (331 lb 12.7 oz)] 149.7 kg (330 lb 1.6 oz) (12/06 1345) Constitutional: He is oriented to person, place, and time. Morbidly obese HENT: anicteric Mouth/Throat: Oropharynx is clear and moist. No oropharyngeal exudate.  Cardiovascular: very distant Pulmonary/Chest: Effort normal and breath sounds normal. No respiratory distress. He has no wheezes.  Abdominal: Soft. Bowel sounds are normal. He exhibits no distension. There is no tenderness. Rectal  tube Lymphadenopathy: He has no cervical adenopathy.  Neurological: He is alert and oriented to person, place, and time.  Ext 1+ bil edema. L foot missing 3rd toe Skin: Skin is warm and dry. LLE with a few scaly lesions Psychiatric: He has a normal mood and affect. His behavior is normal.  Access-  R permacath site covered after removal   Lab Results Recent Labs    01/26/17 0357 01/27/17 0516  WBC 6.4 8.9  HGB 7.6* 8.5*  HCT 22.8* 25.4*  NA 134* 134*  K 3.1* 3.1*  CL 101 100*  CO2 22 22  BUN 38* 45*  CREATININE 4.42* 5.53*    Microbiology: Results for orders placed or performed during the hospital encounter of 01/23/17  CULTURE, BLOOD (ROUTINE X 2) w Reflex to ID Panel     Status: Abnormal   Collection Time: 01/23/17 11:44 AM  Result Value Ref Range Status   Specimen Description BLOOD RIGHT ANTECUBITAL  Final   Special Requests   Final    BOTTLES DRAWN AEROBIC AND ANAEROBIC Blood Culture results may not be optimal due to an excessive volume of blood received in culture bottles   Culture  Setup Time   Final    GRAM POSITIVE COCCI IN BOTH AEROBIC AND ANAEROBIC BOTTLES CRITICAL RESULT CALLED TO, READ BACK BY AND VERIFIED WITH: MATT MCBANE AT [place] ON 01/24/17 [place].    Culture STAPHYLOCOCCUS AUREUS (A)  Final   Report Status 01/26/2017 FINAL  Final   Organism ID, Bacteria STAPHYLOCOCCUS AUREUS  Final      Susceptibility   Staphylococcus aureus - MIC*    CIPROFLOXACIN <=0.5 SENSITIVE Sensitive     ERYTHROMYCIN <=0.25 SENSITIVE Sensitive     GENTAMICIN <=0.5 SENSITIVE Sensitive     OXACILLIN <=0.25 SENSITIVE Sensitive     TETRACYCLINE <=1 SENSITIVE Sensitive     VANCOMYCIN <=0.5 SENSITIVE Sensitive     TRIMETH/SULFA <=10 SENSITIVE Sensitive     CLINDAMYCIN <=0.25 SENSITIVE Sensitive     RIFAMPIN <=0.5 SENSITIVE Sensitive     Inducible Clindamycin NEGATIVE Sensitive     * STAPHYLOCOCCUS AUREUS  CULTURE, BLOOD (ROUTINE X 2) w Reflex to ID Panel     Status: Abnormal    Collection Time: 01/23/17 11:44 AM  Result Value Ref Range Status   Specimen Description BLOOD BLOOD LEFT FOREARM  Final   Special Requests   Final    BOTTLES DRAWN AEROBIC AND ANAEROBIC Blood Culture results may not be optimal due to an excessive volume of blood received in culture bottles   Culture  Setup Time   Final    GRAM POSITIVE COCCI IN BOTH AEROBIC AND ANAEROBIC BOTTLES CRITICAL VALUE NOTED.  VALUE IS CONSISTENT WITH PREVIOUSLY REPORTED AND CALLED VALUE.    Culture (A)  Final    STAPHYLOCOCCUS AUREUS SUSCEPTIBILITIES PERFORMED ON PREVIOUS CULTURE WITHIN THE LAST 5 DAYS. Performed at [facility], [place] [address] [place], [place] [address]    Report Status 01/26/2017 FINAL  Final  Blood Culture ID Panel (Reflexed)     Status: Abnormal   Collection Time: 01/23/17 11:44 AM  Result Value Ref Range Status   Enterococcus species NOT DETECTED NOT DETECTED Final   Listeria monocytogenes NOT DETECTED NOT DETECTED Final   Staphylococcus species DETECTED (A) NOT DETECTED Final    Comment: CRITICAL RESULT CALLED TO, READ BACK BY AND VERIFIED WITH: MATT MCBANE AT 0700 ON 01/24/17 [place].    Staphylococcus aureus DETECTED (A) NOT DETECTED Final    Comment: Methicillin (oxacillin) susceptible Staphylococcus aureus (MSSA). Preferred therapy is anti staphylococcal beta lactam antibiotic (Cefazolin or Nafcillin), unless clinically contraindicated. CRITICAL RESULT CALLED TO, READ BACK BY AND VERIFIED WITH: MATT MCBANE AT 0700 ON 01/24/17 [place].    Methicillin resistance NOT DETECTED NOT DETECTED Final   Streptococcus species NOT DETECTED NOT DETECTED Final   Streptococcus agalactiae NOT DETECTED NOT DETECTED Final   Streptococcus pneumoniae NOT DETECTED NOT DETECTED Final   Streptococcus pyogenes NOT DETECTED NOT DETECTED Final   Acinetobacter baumannii NOT DETECTED NOT DETECTED Final   Enterobacteriaceae species NOT DETECTED NOT DETECTED Final   Enterobacter cloacae complex NOT DETECTED  NOT DETECTED Final   Escherichia coli NOT DETECTED NOT DETECTED Final   Klebsiella oxytoca NOT DETECTED NOT DETECTED Final   Klebsiella pneumoniae NOT DETECTED NOT DETECTED Final   Proteus species NOT DETECTED NOT DETECTED Final   Serratia marcescens NOT DETECTED NOT DETECTED Final   Haemophilus influenzae NOT DETECTED NOT DETECTED Final   Neisseria meningitidis NOT DETECTED NOT DETECTED Final   Pseudomonas aeruginosa NOT DETECTED NOT DETECTED Final   Candida albicans NOT DETECTED NOT DETECTED Final   Candida glabrata NOT DETECTED NOT DETECTED Final   Candida krusei NOT DETECTED NOT DETECTED Final   Candida parapsilosis NOT DETECTED NOT DETECTED Final   Candida tropicalis NOT DETECTED NOT DETECTED Final  C difficile quick scan w PCR reflex     Status: None  Collection Time: 01/23/17  1:24 PM  Result Value Ref Range Status   C Diff antigen NEGATIVE NEGATIVE Final   C Diff toxin NEGATIVE NEGATIVE Final   C Diff interpretation No C. difficile detected.  Final  MRSA PCR Screening     Status: None   Collection Time: 01/23/17  2:57 PM  Result Value Ref Range Status   MRSA by PCR NEGATIVE NEGATIVE Final    Comment:        The GeneXpert MRSA Assay (FDA approved for NASAL specimens only), is one component of a comprehensive MRSA colonization surveillance program. It is not intended to diagnose MRSA infection nor to guide or monitor treatment for MRSA infections.   Culture, blood (single) w Reflex to ID Panel     Status: None (Preliminary result)   Collection Time: 01/24/17  4:34 PM  Result Value Ref Range Status   Specimen Description BLOOD BLOOD RIGHT HAND  Final   Special Requests   Final    BOTTLES DRAWN AEROBIC AND ANAEROBIC Blood Culture adequate volume   Culture NO GROWTH 3 DAYS  Final   Report Status PENDING  Incomplete  Cath Tip Culture     Status: None (Preliminary result)   Collection Time: 01/24/17  4:42 PM  Result Value Ref Range Status   Specimen Description CATH  TIP  Final   Special Requests NONE  Final   Culture   Final    >100,000 CFU STAPHYLOCOCCUS AUREUS SUSCEPTIBILITIES TO FOLLOW Performed at [facility], [place] [address] [place], [place] [address]    Report Status PENDING  Incomplete  Gastrointestinal Panel by PCR , Stool     Status: Abnormal   Collection Time: 01/24/17  9:23 PM  Result Value Ref Range Status   Campylobacter species NOT DETECTED NOT DETECTED Final   Plesimonas shigelloides NOT DETECTED NOT DETECTED Final   Salmonella species NOT DETECTED NOT DETECTED Final   Yersinia enterocolitica NOT DETECTED NOT DETECTED Final   Vibrio species NOT DETECTED NOT DETECTED Final   Vibrio cholerae NOT DETECTED NOT DETECTED Final   Enteroaggregative E coli (EAEC) NOT DETECTED NOT DETECTED Final   Enteropathogenic E coli (EPEC) DETECTED (A) NOT DETECTED Final    Comment: RESULT CALLED TO, READ BACK BY AND VERIFIED WITH: CHELSEA WRENN AT 0011 01/25/17 [place]    Enterotoxigenic E coli (ETEC) NOT DETECTED NOT DETECTED Final   Shiga like toxin producing E coli (STEC) NOT DETECTED NOT DETECTED Final   Shigella/Enteroinvasive E coli (EIEC) NOT DETECTED NOT DETECTED Final   Cryptosporidium NOT DETECTED NOT DETECTED Final   Cyclospora cayetanensis NOT DETECTED NOT DETECTED Final   Entamoeba histolytica NOT DETECTED NOT DETECTED Final   Giardia lamblia NOT DETECTED NOT DETECTED Final   Adenovirus F40/41 NOT DETECTED NOT DETECTED Final   Astrovirus NOT DETECTED NOT DETECTED Final   Norovirus GI/GII NOT DETECTED NOT DETECTED Final   Rotavirus A NOT DETECTED NOT DETECTED Final   Sapovirus (I, II, IV, and V) NOT DETECTED NOT DETECTED Final    Studies/Results: No results found.  Assessment/Plan: LILTON PARE is a 56 y.o. male with Morbid obesity, CHF, ESRD on HD recently through R permacath now with MSSA bacteremia, sepsis and NSTEMI.  He has no evidence of metastatic infection- does have baseline back pain but no worsening. He does not have  a PPM.  He had removal  HD cath 12/3 - cath tip +, repeat bcx ngtd from 12/3. Echo poor study, but no veg. TEE pending  Recommendations Cont  ancef. For TEE when stable and ok with cardiology Min 2 weeks IV abx will be needed.  Agree with renal plan for cath - Plan for dialysis today and Saturday and then remove the dialysis catheter. PermCath to be placed on Monday   Thank you very much for the consult. Will follow with you.  Leonel Ramsay   01/27/2017, 4:59 PM

## 2017-01-27 NOTE — Progress Notes (Signed)
[facility] Encounter Note  Patient: Brian [name] / Admit Date: 01/23/2017 / Date of Encounter: 01/27/2017, 1:01 PM   Subjective: Patient has significant improvements from admission from primary sepsis.  Patient had a significant myocardial infarction but has no residual chest discomfort at this time.  Patient also had heart failure with improvements with changes and fluid balances at this time.  Patient has blood cultures that are negative after significant sepsis but there is concern of endocarditis prior to placing a new catheter  Review of Systems: Positive for: Shortness of breath Negative for: Vision change, hearing change, syncope, dizziness, nausea, vomiting,diarrhea, bloody stool, stomach pain, cough, congestion, diaphoresis, urinary frequency, urinary pain,skin lesions, skin rashes Others previously listed  Objective: Telemetry: Sinus rhythm Physical Exam: Blood pressure 115/62, pulse (!) 104, temperature 98.6 F (37 C), temperature source Oral, resp. rate 17, height 5\' 10"  (1.778 m), weight (!) 149.7 kg (330 lb 1.6 oz), SpO2 100 %. Body mass index is 47.36 kg/m. General: Well developed, well nourished, in no acute distress. Head: Normocephalic, atraumatic, sclera non-icteric, no xanthomas, nares are without discharge. Neck: No apparent masses Lungs: Normal respirations with no wheezes, no rhonchi, no rales , few crackles   Heart: Regular rate and rhythm, normal S1 S2, no murmur, no rub, no gallop, PMI is normal size and placement, carotid upstroke normal without bruit, jugular venous pressure normal Abdomen: Soft, non-tender, non-distended with normoactive bowel sounds. No hepatosplenomegaly. Abdominal aorta is normal size without bruit Extremities: 1-2+ edema, no clubbing, no cyanosis, no ulcers,  Peripheral: 2+ radial, 2+ femoral, 2+ dorsal pedal pulses Neuro: Alert and oriented. Moves all extremities spontaneously. Psych:  Responds to questions  appropriately with a normal affect.   Intake/Output Summary (Last 24 hours) at 01/27/2017 1301 Last data filed at 01/27/2017 0500 Gross per 24 hour  Intake 168.27 ml  Output 100 ml  Net 68.27 ml    Inpatient Medications:  . aspirin EC  81 mg Oral Daily  . atorvastatin  80 mg Oral Daily  . epoetin (EPOGEN/PROCRIT) injection  4,000 Units Intravenous Q T,Th,Sa-HD  . gabapentin  100 mg Oral QHS  . heparin  4,000 Units Intracatheter Once  . insulin aspart  0-5 Units Subcutaneous QHS  . insulin aspart  0-9 Units Subcutaneous TID WC  . levothyroxine  50 mcg Oral QAC breakfast  . sertraline  25 mg Oral Daily  . sodium chloride flush  10-40 mL Intracatheter Q12H   Infusions:  .  ceFAZolin (ANCEF) IV    .  ceFAZolin (ANCEF) IV    . heparin 2,600 Units/hr (01/27/17 0621)    Labs: Recent Labs    01/26/17 0357 01/27/17 0516  NA 134* 134*  K 3.1* 3.1*  CL 101 100*  CO2 22 22  GLUCOSE 144* 134*  BUN 38* 45*  CREATININE 4.42* 5.53*  CALCIUM 7.7* 8.1*  MG 1.8 1.9  PHOS 4.4 4.9*   No results for input(s): AST, ALT, ALKPHOS, BILITOT, PROT, ALBUMIN in the last 72 hours. Recent Labs    01/26/17 0357 01/27/17 0516  WBC 6.4 8.9  HGB 7.6* 8.5*  HCT 22.8* 25.4*  MCV 89.4 89.7  PLT 137* 157   Recent Labs    01/24/17 1502 01/24/17 2359  TROPONINI 30.22* 19.35*   Invalid input(s): POCBNP No results for input(s): HGBA1C in the last 72 hours.   Weights: Filed Weights   01/26/17 0500 01/27/17 0500 01/27/17 0652  Weight: (!) 149 kg (328 lb 7.8 oz) (!) 150.5  kg (331 lb 12.7 oz) (!) 149.7 kg (330 lb 1.6 oz)     Radiology/Studies:  Dg Chest 1 View  Result Date: 01/25/2017 CLINICAL DATA:  Central line placed EXAM: CHEST 1 VIEW COMPARISON:  Portable chest x-ray of 01/23/2017 FINDINGS: The tip of the left IJ central venous line is at the approximate junction of the left innominate vein and SVC. No pneumothorax is seen. There is cardiomegaly present with probable mild pulmonary  vascular congestion. No pleural effusion is seen. Right central venous line tip is unchanged overlying the mid SVC. IMPRESSION: 1. Left central venous line tip overlies the junction of the left innominate vein and SVC. 2. Cardiomegaly and mild pulmonary vascular congestion. 3. Right central venous line tip overlies the mid SVC. Electronically Signed   By: Ivar Drape M.D.   On: 01/25/2017 16:53   Dg Chest 1 View  Result Date: 01/23/2017 CLINICAL DATA:  Central line placement. EXAM: CHEST 1 VIEW COMPARISON:  01/23/2017 and prior radiographs FINDINGS: A left IJ central venous catheter is now noted with tip overlying upper SVC. A right IJ central venous catheter is again noted with tip overlying the mid SVC. Cardiomegaly and pulmonary vascular congestion again identified. Slight decrease in interstitial opacities/edema noted. There is no evidence of pneumothorax. IMPRESSION: Left IJ central venous catheter placement with tip overlying the upper SVC. No evidence of pneumothorax. Slight decrease in interstitial opacities/edema. Cardiomegaly and pulmonary vascular congestion. Electronically Signed   By: Margarette Canada M.D.   On: 01/23/2017 16:26   [place] Ue Vein Mapping Left  Result Date: 01/05/2017 CLINICAL DATA:  End-stage renal disease EXAM: [place] EXTREM UP VEIN MAPPING COMPARISON:  None. FINDINGS: LEFT ARTERIES Wrist Radial Artery: Size 1.77mm  Waveform triphasic Wrist Ulnar Artery: Size 1.27mm  Waveform triphasic Prox. Forearm Radial Artery: Size 1.26mm  Waveform triphasic Upper Arm Brachial Artery: Size 5.26mm  Waveform triphasic LEFT VEINS Forearm Cephalic Vein: Prox 2.9BM Distal 2.63mm Depth 8.4-1.3KG Forearm Basilic Vein: Prox diminutive Distal diminutive Upper Arm Cephalic Vein: Prox 4.0NU Distal 4.80mm Depth 13.7 -4.29mm There is a segment of wall thickening without occlusion All Upper Arm Basilic Vein: Prox 63mm Distal 1.44mm Depth 15.3 -6.40mm Upper Arm Brachial Vein: Prox 6.47mm Distal 3.52mm Depth 0.2 0.1 -15.41mm  ADDITIONAL LEFT VEINS Axillary Vein:  9.28mm Subclavian Vein: Patient: Yes Respiratory Phasicity: Present Internal Jugular Vein: Patent: Yes    Respiratory Phasicity: Present Branches > 2 mm: At least 1 from the cephalic vein in the upper arm, 3 mm At least 1 from the basilic vein, upper arm, 1.4 mm At least 4 from the brachial vein in the upper arm, 2.3-4.5 mm Note made of patent right subclavian and IJ veins with respiratory phasicity. IMPRESSION: Patent left upper extremity vasculature as detailed above. Electronically Signed   By: Lucrezia Europe M.D.   On: 01/05/2017 09:03   Dg Chest Port 1 View  Result Date: 01/23/2017 CLINICAL DATA:  Cough, fever EXAM: PORTABLE CHEST 1 VIEW COMPARISON:  12/31/2016 FINDINGS: Right dialysis catheter in place with the tip at the cavoatrial junction. Cardiomegaly with diffuse interstitial opacities likely edema. No visible effusions or acute bony abnormality. IMPRESSION: Cardiomegaly. Diffuse interstitial opacities in the mid and lower lungs, likely edema/ CHF. Electronically Signed   By: Rolm Baptise M.D.   On: 01/23/2017 12:27   Dg Chest Port 1 View  Result Date: 12/31/2016 CLINICAL DATA:  Central line placement EXAM: PORTABLE CHEST 1 VIEW COMPARISON:  12/29/2016 FINDINGS: Right jugular central venous catheter tip in the  proximal SVC. No pneumothorax Progression of pulmonary vascular congestion and mild edema. No effusion. IMPRESSION: Central line placement in the proximal SVC.  No pneumothorax Progression of heart failure with mild edema. Electronically Signed   By: Franchot Gallo M.D.   On: 12/31/2016 15:01   Dg Chest Portable 1 View  Result Date: 12/29/2016 CLINICAL DATA:  Initial evaluation for acute shortness of breath. History of CHF, COPD, hypertension. EXAM: PORTABLE CHEST 1 VIEW COMPARISON:  Prior radiograph from 02/26/2016. FINDINGS: Moderate cardiomegaly, stable. Apparent widening of the mediastinum favored to be related to AP technique, shallow lung inflation,  and lordotic angulation. Lungs normally inflated. Diffuse pulmonary vascular and interstitial congestion without frank pulmonary edema. Underlying COPD. No focal infiltrates. No appreciable pleural effusion. Mild right basilar subsegmental atelectasis. No pneumothorax. No acute osseus abnormality. IMPRESSION: 1. Cardiomegaly with mild diffuse pulmonary interstitial congestion without frank pulmonary edema. 2. Mild right basilar atelectasis. Electronically Signed   By: Jeannine Boga M.D.   On: 12/29/2016 17:14   Dg Hip Unilat W Or Wo Pelvis 2-3 Views Left  Result Date: 12/29/2016 CLINICAL DATA:  Initial evaluation for multiple recent falls, pain. EXAM: DG HIP (WITH OR WITHOUT PELVIS) 2-3V LEFT COMPARISON:  Prior radiograph from 07/12/2016. FINDINGS: Left hip in slight internal rotation. No acute fracture dislocation. Femoral head in normal alignment with the acetabulum. Heterotopic calcification adjacent to the greater trochanter is stable. Bony pelvis intact. SI joints approximated. Mild degenerative osteoarthrosis. Degenerative changes noted within lower lumbar spine. No acute soft tissue abnormality.  Atherosclerosis. IMPRESSION: 1. No acute osseous abnormality about the left hip. 2. Mild degenerative osteoarthrosis. Electronically Signed   By: Jeannine Boga M.D.   On: 12/29/2016 17:12   Dg Hip Unilat  With Pelvis 2-3 Views Right  Result Date: 12/29/2016 CLINICAL DATA:  Initial evaluation for multiple recent falls, acute right hip pain. EXAM: DG HIP (WITH OR WITHOUT PELVIS) 2-3V RIGHT COMPARISON:  None. FINDINGS: No acute fracture or dislocation. Femoral head in normal alignment with the acetabulum. Femoral head height preserved. Bony pelvis intact. SI joints approximated. Mild degenerative osteoarthrosis about the hips bilaterally. Degenerative changes noted within lower lumbar spine. No acute soft tissue abnormality.  Atherosclerosis noted. IMPRESSION: 1. No acute osseous abnormality about  the right hip. 2. Mild degenerative osteoarthrosis. Electronically Signed   By: Jeannine Boga M.D.   On: 12/29/2016 17:10     Assessment and Recommendation  56 y.o. male with known coronary artery disease elevation myocardial infarction after significant septic episode and bacteremia with diastolic dysfunction heart failure and end-stage renal disease with COPD slightly recovering and needing further evaluation and treatment options of possible residual endocarditis prior to new catheter placement 1.  Continue supportive care and treatment with antibiotics for previous sepsis and bacteremia 2.  And/or furosemide as necessary for congestive heart failure 3.  Atorvastatin for high intensity cholesterol therapy 4.  Proceed to transesophageal echocardiogram for further evaluation and treatment options for possible endocarditis.  Patient understands risk and benefits of transesophageal echocardiogram.  This includes the possibility of esophageal perforation medication side effects rhythm disturbances.  Patient is a low risk for moderate sedation  Signed, Serafina Royals M.D. FACC

## 2017-01-27 NOTE — Progress Notes (Signed)
Patient's pulse ox suddenly dropped to the mid 50's with a good wave form. O2 increased to 5L and woke patient up. A few minutes later we hear the patient snoring and his oxygen drops to the mid 50s again with a good wave form. Woke up patient. He said he refused the C-Pap earlier because he feels claustrophobic and just uses oxygen at home. I confirmed that the patient was willing to put on the mask and called Respiratory Therapy to ensure settings were correct for patient.

## 2017-01-28 ENCOUNTER — Encounter
Admission: EM | Disposition: A | Discharge status: Home Health | Payer: Self-pay | Source: Home / Self Care | Attending: Internal Medicine

## 2017-01-28 ENCOUNTER — Encounter: Payer: Self-pay | Admitting: *Deleted

## 2017-01-28 HISTORY — PX: TEE WITHOUT CARDIOVERSION: SHX5443

## 2017-01-28 LAB — GLUCOSE, CAPILLARY
GLUCOSE-CAPILLARY: 129 mg/dL — AB (ref 65–99)
GLUCOSE-CAPILLARY: 123 mg/dL — AB (ref 65–99)
GLUCOSE-CAPILLARY: 155 mg/dL — AB (ref 65–99)
Glucose-Capillary: 134 mg/dL — ABNORMAL HIGH (ref 65–99)
Glucose-Capillary: 134 mg/dL — ABNORMAL HIGH (ref 65–99)
Glucose-Capillary: 173 mg/dL — ABNORMAL HIGH (ref 65–99)

## 2017-01-28 LAB — CBC
HEMATOCRIT: 25.7 % — AB (ref 40.0–52.0)
Hemoglobin: 8.6 g/dL — ABNORMAL LOW (ref 13.0–18.0)
MCH: 29.7 pg (ref 26.0–34.0)
MCHC: 33.4 g/dL (ref 32.0–36.0)
MCV: 89 fL (ref 80.0–100.0)
PLATELETS: 190 10*3/uL (ref 150–440)
RBC: 2.89 MIL/uL — ABNORMAL LOW (ref 4.40–5.90)
RDW: 15.3 % — AB (ref 11.5–14.5)
WBC: 10.1 10*3/uL (ref 3.8–10.6)

## 2017-01-28 LAB — HEPARIN LEVEL (UNFRACTIONATED)
HEPARIN UNFRACTIONATED: 0.33 [IU]/mL (ref 0.30–0.70)
Heparin Unfractionated: 0.38 IU/mL (ref 0.30–0.70)

## 2017-01-28 LAB — PHOSPHORUS: PHOSPHORUS: 3.6 mg/dL (ref 2.5–4.6)

## 2017-01-28 LAB — ANTITHROMBIN III: ANTITHROMB III FUNC: 62 % — AB (ref 75–120)

## 2017-01-28 LAB — MAGNESIUM: Magnesium: 1.8 mg/dL (ref 1.7–2.4)

## 2017-01-28 SURGERY — ECHOCARDIOGRAM, TRANSESOPHAGEAL
Anesthesia: Moderate Sedation

## 2017-01-28 MED ORDER — VITAMIN C 500 MG PO TABS
500.0000 mg | ORAL_TABLET | Freq: Every day | ORAL | Status: DC
  Administered 2017-01-28 – 2017-01-30 (×3): 500 mg via ORAL
  Filled 2017-01-28 (×4): qty 1

## 2017-01-28 MED ORDER — MIDAZOLAM HCL 2 MG/2ML IJ SOLN
INTRAMUSCULAR | Status: AC | PRN
  Administered 2017-01-28: 1 mg via INTRAVENOUS
  Administered 2017-01-28: 2 mg via INTRAVENOUS

## 2017-01-28 MED ORDER — LIDOCAINE VISCOUS 2 % MT SOLN
OROMUCOSAL | Status: AC
  Administered 2017-01-28: 08:00:00
  Filled 2017-01-28: qty 15

## 2017-01-28 MED ORDER — BUTAMBEN-TETRACAINE-BENZOCAINE 2-2-14 % EX AERO
INHALATION_SPRAY | CUTANEOUS | Status: AC
  Administered 2017-01-28: 08:00:00
  Filled 2017-01-28: qty 5

## 2017-01-28 MED ORDER — FENTANYL CITRATE (PF) 100 MCG/2ML IJ SOLN
INTRAMUSCULAR | Status: AC
  Filled 2017-01-28: qty 2

## 2017-01-28 MED ORDER — FENTANYL CITRATE (PF) 100 MCG/2ML IJ SOLN
INTRAMUSCULAR | Status: AC | PRN
  Administered 2017-01-28: 25 ug via INTRAVENOUS
  Administered 2017-01-28: 50 ug via INTRAVENOUS

## 2017-01-28 MED ORDER — SODIUM CHLORIDE 0.9 % IV SOLN: INTRAVENOUS | Status: DC

## 2017-01-28 MED ORDER — RENA-VITE PO TABS
1.0000 | ORAL_TABLET | Freq: Every day | ORAL | Status: DC
  Administered 2017-01-28 – 2017-01-30 (×3): 1 via ORAL
  Filled 2017-01-28 (×3): qty 1

## 2017-01-28 MED ORDER — MIDAZOLAM HCL 5 MG/5ML IJ SOLN
INTRAMUSCULAR | Status: AC
  Filled 2017-01-28: qty 5

## 2017-01-28 MED ORDER — SODIUM CHLORIDE FLUSH 0.9 % IV SOLN
INTRAVENOUS | Status: AC
  Filled 2017-01-28: qty 10

## 2017-01-28 MED ORDER — ENSURE ENLIVE PO LIQD
237.0000 mL | Freq: Two times a day (BID) | ORAL | Status: DC
  Administered 2017-01-28 – 2017-01-29 (×2): 237 mL via ORAL

## 2017-01-28 MED ORDER — NEPRO/CARBSTEADY PO LIQD
237.0000 mL | Freq: Two times a day (BID) | ORAL | Status: DC
  Administered 2017-01-28 – 2017-01-29 (×2): 237 mL via ORAL

## 2017-01-28 NOTE — Progress Notes (Signed)
[facility] INFECTIOUS DISEASE PROGRESS NOTE Date of Admission:  01/23/2017     ID: Brian [name] is a 56 y.o. male with MSSA bacteremia Active Problems:   NSTEMI (non-ST elevated myocardial infarction) (HCC)   Subjective: No fevers, TEE negative. No new complaints.   ROS  Eleven systems are reviewed and negative except per hpi  Medications:  Antibiotics Given (last 72 hours)    Date/Time Action Medication Dose Rate   01/25/17 1958 New Bag/Given   ceFAZolin (ANCEF) 1 g in dextrose 5 % 50 mL injection     01/27/17 1842 New Bag/Given  [dialysis nurse adminsitered it.]   ceFAZolin (ANCEF) 2 g in dextrose 5 % 100 mL IVPB 2 g 240 mL/hr     . aspirin EC  81 mg Oral Daily  . atorvastatin  80 mg Oral Daily  . epoetin (EPOGEN/PROCRIT) injection  4,000 Units Intravenous Q T,Th,Sa-HD  . feeding supplement (ENSURE ENLIVE)  237 mL Oral BID BM  . feeding supplement (NEPRO CARB STEADY)  237 mL Oral BID BM  . fentaNYL      . gabapentin  100 mg Oral QHS  . heparin  4,000 Units Intracatheter Once  . insulin aspart  0-5 Units Subcutaneous QHS  . insulin aspart  0-9 Units Subcutaneous TID WC  . levothyroxine  50 mcg Oral QAC breakfast  . midazolam      . multivitamin  1 tablet Oral QHS  . sertraline  25 mg Oral Daily  . sodium chloride flush  10-40 mL Intracatheter Q12H  . sodium chloride flush      . vitamin C  500 mg Oral Daily    Objective: Vital signs in last 24 hours: Temp:  [97.4 F (36.3 C)-98.8 F (37.1 C)] 98.5 F (36.9 C) (12/07 1104) Pulse Rate:  [49-106] 97 (12/07 1104) Resp:  [16-25] 18 (12/07 1104) BP: (118-193)/(48-93) 138/86 (12/07 1104) SpO2:  [85 %-99 %] 96 % (12/07 1104) Weight:  [149.7 kg (330 lb)] 149.7 kg (330 lb) (12/07 0732) Constitutional: He is oriented to person, place, and time. Morbidly obese HENT: anicteric Mouth/Throat: Oropharynx is clear and moist. No oropharyngeal exudate.  Cardiovascular: very distant Pulmonary/Chest: Effort normal and  breath sounds normal. No respiratory distress. He has no wheezes.  Abdominal: Soft. Bowel sounds are normal. He exhibits no distension. There is no tenderness. Rectal tube Lymphadenopathy: He has no cervical adenopathy.  Neurological: He is alert and oriented to person, place, and time.  Ext 1+ bil edema. L foot missing 3rd toe Skin: Skin is warm and dry. LLE with a few scaly lesions Psychiatric: He has a normal mood and affect. His behavior is normal.  Access-  R permacath site covered after removal   Lab Results Recent Labs    01/26/17 0357 01/27/17 0516 01/28/17 0611  WBC 6.4 8.9 10.1  HGB 7.6* 8.5* 8.6*  HCT 22.8* 25.4* 25.7*  NA 134* 134*  --   K 3.1* 3.1*  --   CL 101 100*  --   CO2 22 22  --   BUN 38* 45*  --   CREATININE 4.42* 5.53*  --     Microbiology: Results for orders placed or performed during the hospital encounter of 01/23/17  CULTURE, BLOOD (ROUTINE X 2) w Reflex to ID Panel     Status: Abnormal   Collection Time: 01/23/17 11:44 AM  Result Value Ref Range Status   Specimen Description BLOOD RIGHT ANTECUBITAL  Final   Special Requests   Final  BOTTLES DRAWN AEROBIC AND ANAEROBIC Blood Culture results may not be optimal due to an excessive volume of blood received in culture bottles   Culture  Setup Time   Final    GRAM POSITIVE COCCI IN BOTH AEROBIC AND ANAEROBIC BOTTLES CRITICAL RESULT CALLED TO, READ BACK BY AND VERIFIED WITH: MATT MCBANE AT [id number] ON 01/24/17 [place].    Culture STAPHYLOCOCCUS AUREUS (A)  Final   Report Status 01/26/2017 FINAL  Final   Organism ID, Bacteria STAPHYLOCOCCUS AUREUS  Final      Susceptibility   Staphylococcus aureus - MIC*    CIPROFLOXACIN <=0.5 SENSITIVE Sensitive     ERYTHROMYCIN <=0.25 SENSITIVE Sensitive     GENTAMICIN <=0.5 SENSITIVE Sensitive     OXACILLIN <=0.25 SENSITIVE Sensitive     TETRACYCLINE <=1 SENSITIVE Sensitive     VANCOMYCIN <=0.5 SENSITIVE Sensitive     TRIMETH/SULFA <=10 SENSITIVE Sensitive      CLINDAMYCIN <=0.25 SENSITIVE Sensitive     RIFAMPIN <=0.5 SENSITIVE Sensitive     Inducible Clindamycin NEGATIVE Sensitive     * STAPHYLOCOCCUS AUREUS  CULTURE, BLOOD (ROUTINE X 2) w Reflex to ID Panel     Status: Abnormal   Collection Time: 01/23/17 11:44 AM  Result Value Ref Range Status   Specimen Description BLOOD BLOOD LEFT FOREARM  Final   Special Requests   Final    BOTTLES DRAWN AEROBIC AND ANAEROBIC Blood Culture results may not be optimal due to an excessive volume of blood received in culture bottles   Culture  Setup Time   Final    GRAM POSITIVE COCCI IN BOTH AEROBIC AND ANAEROBIC BOTTLES CRITICAL VALUE NOTED.  VALUE IS CONSISTENT WITH PREVIOUSLY REPORTED AND CALLED VALUE.    Culture (A)  Final    STAPHYLOCOCCUS AUREUS SUSCEPTIBILITIES PERFORMED ON PREVIOUS CULTURE WITHIN THE LAST 5 DAYS. Performed at [facility], [place] [address], [place], [place] [address]    Report Status 01/26/2017 FINAL  Final  Blood Culture ID Panel (Reflexed)     Status: Abnormal   Collection Time: 01/23/17 11:44 AM  Result Value Ref Range Status   Enterococcus species NOT DETECTED NOT DETECTED Final   Listeria monocytogenes NOT DETECTED NOT DETECTED Final   Staphylococcus species DETECTED (A) NOT DETECTED Final    Comment: CRITICAL RESULT CALLED TO, READ BACK BY AND VERIFIED WITH: MATT MCBANE AT 0700 ON 01/24/17 [place].    Staphylococcus aureus DETECTED (A) NOT DETECTED Final    Comment: Methicillin (oxacillin) susceptible Staphylococcus aureus (MSSA). Preferred therapy is anti staphylococcal beta lactam antibiotic (Cefazolin or Nafcillin), unless clinically contraindicated. CRITICAL RESULT CALLED TO, READ BACK BY AND VERIFIED WITH: MATT MCBANE AT 0700 ON 01/24/17 [place].    Methicillin resistance NOT DETECTED NOT DETECTED Final   Streptococcus species NOT DETECTED NOT DETECTED Final   Streptococcus agalactiae NOT DETECTED NOT DETECTED Final   Streptococcus pneumoniae NOT DETECTED NOT DETECTED  Final   Streptococcus pyogenes NOT DETECTED NOT DETECTED Final   Acinetobacter baumannii NOT DETECTED NOT DETECTED Final   Enterobacteriaceae species NOT DETECTED NOT DETECTED Final   Enterobacter cloacae complex NOT DETECTED NOT DETECTED Final   Escherichia coli NOT DETECTED NOT DETECTED Final   Klebsiella oxytoca NOT DETECTED NOT DETECTED Final   Klebsiella pneumoniae NOT DETECTED NOT DETECTED Final   Proteus species NOT DETECTED NOT DETECTED Final   Serratia marcescens NOT DETECTED NOT DETECTED Final   Haemophilus influenzae NOT DETECTED NOT DETECTED Final   Neisseria meningitidis NOT DETECTED NOT DETECTED Final  Pseudomonas aeruginosa NOT DETECTED NOT DETECTED Final   Candida albicans NOT DETECTED NOT DETECTED Final   Candida glabrata NOT DETECTED NOT DETECTED Final   Candida krusei NOT DETECTED NOT DETECTED Final   Candida parapsilosis NOT DETECTED NOT DETECTED Final   Candida tropicalis NOT DETECTED NOT DETECTED Final  C difficile quick scan w PCR reflex     Status: None   Collection Time: 01/23/17  1:24 PM  Result Value Ref Range Status   C Diff antigen NEGATIVE NEGATIVE Final   C Diff toxin NEGATIVE NEGATIVE Final   C Diff interpretation No C. difficile detected.  Final  MRSA PCR Screening     Status: None   Collection Time: 01/23/17  2:57 PM  Result Value Ref Range Status   MRSA by PCR NEGATIVE NEGATIVE Final    Comment:        The GeneXpert MRSA Assay (FDA approved for NASAL specimens only), is one component of a comprehensive MRSA colonization surveillance program. It is not intended to diagnose MRSA infection nor to guide or monitor treatment for MRSA infections.   Culture, blood (single) w Reflex to ID Panel     Status: None (Preliminary result)   Collection Time: 01/24/17  4:34 PM  Result Value Ref Range Status   Specimen Description BLOOD BLOOD RIGHT HAND  Final   Special Requests   Final    BOTTLES DRAWN AEROBIC AND ANAEROBIC Blood Culture adequate volume    Culture NO GROWTH 4 DAYS  Final   Report Status PENDING  Incomplete  Cath Tip Culture     Status: None (Preliminary result)   Collection Time: 01/24/17  4:42 PM  Result Value Ref Range Status   Specimen Description CATH TIP  Final   Special Requests NONE  Final   Culture   Final    >100,000 CFU STAPHYLOCOCCUS AUREUS SUSCEPTIBILITIES TO FOLLOW Performed at [facility], [place] [address], [place], [place] [address]    Report Status PENDING  Incomplete  Gastrointestinal Panel by PCR , Stool     Status: Abnormal   Collection Time: 01/24/17  9:23 PM  Result Value Ref Range Status   Campylobacter species NOT DETECTED NOT DETECTED Final   Plesimonas shigelloides NOT DETECTED NOT DETECTED Final   Salmonella species NOT DETECTED NOT DETECTED Final   Yersinia enterocolitica NOT DETECTED NOT DETECTED Final   Vibrio species NOT DETECTED NOT DETECTED Final   Vibrio cholerae NOT DETECTED NOT DETECTED Final   Enteroaggregative E coli (EAEC) NOT DETECTED NOT DETECTED Final   Enteropathogenic E coli (EPEC) DETECTED (A) NOT DETECTED Final    Comment: RESULT CALLED TO, READ BACK BY AND VERIFIED WITH: CHELSEA WRENN AT 0011 01/25/17 [place]    Enterotoxigenic E coli (ETEC) NOT DETECTED NOT DETECTED Final   Shiga like toxin producing E coli (STEC) NOT DETECTED NOT DETECTED Final   Shigella/Enteroinvasive E coli (EIEC) NOT DETECTED NOT DETECTED Final   Cryptosporidium NOT DETECTED NOT DETECTED Final   Cyclospora cayetanensis NOT DETECTED NOT DETECTED Final   Entamoeba histolytica NOT DETECTED NOT DETECTED Final   Giardia lamblia NOT DETECTED NOT DETECTED Final   Adenovirus F40/41 NOT DETECTED NOT DETECTED Final   Astrovirus NOT DETECTED NOT DETECTED Final   Norovirus GI/GII NOT DETECTED NOT DETECTED Final   Rotavirus A NOT DETECTED NOT DETECTED Final   Sapovirus (I, II, IV, and V) NOT DETECTED NOT DETECTED Final    Studies/Results: No results found.  Assessment/Plan: Brian [name] is a 56  y.o. male with Morbid obesity, CHF, ESRD on HD recently through R permacath now with MSSA bacteremia, sepsis and NSTEMI.  He has no evidence of metastatic infection- does have baseline back pain but no worsening. He does not have a PPM.  He had removal  HD cath 12/3 - cath tip +, repeat bcx ngtd from 12/3. Echo poor study, but no veg.  TEE negative   Recommendations Cont  ancef. Will plan on 2 weeks IV ancef which can be given at HD. Stop date would be 02/07/17 Agree with renal plan for cath - Plan for dialysis today and Saturday and then remove the dialysis catheter. PermCath to be placed on Monday  Thank you very much for the consult. Will follow with you.  Leonel Ramsay   01/28/2017, 3:12 PM

## 2017-01-28 NOTE — Progress Notes (Signed)
[facility], [place] 01/28/17  Subjective:  Patient is doing fair Denies acute C/O Blood cultures are positive for staph aureus (MSSA) Patient is able to eat or drink without nausea vomiting He has some lower extremity edema   Objective:  Vital signs in last 24 hours:  Temp:  [97.4 F (36.3 C)-98.8 F (37.1 C)] 98.5 F (36.9 C) (12/07 1104) Pulse Rate:  [49-106] 97 (12/07 1104) Resp:  [16-25] 18 (12/07 1104) BP: (115-193)/(48-93) 138/86 (12/07 1104) SpO2:  [85 %-100 %] 96 % (12/07 1104) Weight:  [149.7 kg (330 lb)-149.7 kg (330 lb 1.6 oz)] 149.7 kg (330 lb) (12/07 0732)  Weight change: -0.768 kg (-11.1 oz) Filed Weights   01/27/17 0652 01/27/17 1345 01/28/17 0732  Weight: (!) 149.7 kg (330 lb 1.6 oz) (!) 149.7 kg (330 lb 1.6 oz) (!) 149.7 kg (330 lb)    Intake/Output:    Intake/Output Summary (Last 24 hours) at 01/28/2017 1116 Last data filed at 01/27/2017 2100 Gross per 24 hour  Intake 100 ml  Output 150 ml  Net -50 ml     Physical Exam: General:  Lying in the bed  HEENT Anicteric, moist mucus membranes  Neck Supple,   Pulm/lungs  mild scattered rhonchi, otherwise clear  CVS/Heart irregular  Abdomen:  Soft, NT,   Extremities: + dependent edema  Neurologic: Alert, oteinted     Access: Rt IJ temp cath       Basic Metabolic Panel:  Recent Labs  Lab 01/23/17 1144 01/23/17 2057 01/24/17 0253 01/25/17 0517 01/26/17 0357 01/27/17 0516 01/28/17 0611  NA 132*  --  132* 129* 134* 134*  --   K 4.3  --  3.9 3.7 3.1* 3.1*  --   CL 100*  --  103 101 101 100*  --   CO2 20*  --  18* 17* 22 22  --   GLUCOSE 182*  --  153* 159* 144* 134*  --   BUN 52*  --  58* 72* 38* 45*  --   CREATININE 4.92*  --  5.43* 6.61* 4.42* 5.53*  --   CALCIUM 8.5*  --  8.0* 8.0* 7.7* 8.1*  --   MG  --  1.7  --  2.0 1.8 1.9 1.8  PHOS  --  3.8  --  5.3* 4.4 4.9* 3.6     CBC: Recent Labs  Lab 01/23/17 1144 01/24/17 0253 01/25/17 0517 01/26/17 0357  01/27/17 0516 01/28/17 0611  WBC 9.9 7.2 7.2 6.4 8.9 10.1  NEUTROABS 8.5*  --   --   --   --   --   HGB 9.9* 9.1* 8.8* 7.6* 8.5* 8.6*  HCT 30.2* 27.4* 26.4* 22.8* 25.4* 25.7*  MCV 91.5 91.0 90.3 89.4 89.7 89.0  PLT 218 178 161 137* 157 190      Lab Results  Component Value Date   HEPBSAG Negative 12/31/2016   HEPBSAB Non Reactive 12/31/2016      Microbiology:  Recent Results (from the past 240 hour(s))  CULTURE, BLOOD (ROUTINE X 2) w Reflex to ID Panel     Status: Abnormal   Collection Time: 01/23/17 11:44 AM  Result Value Ref Range Status   Specimen Description BLOOD RIGHT ANTECUBITAL  Final   Special Requests   Final    BOTTLES DRAWN AEROBIC AND ANAEROBIC Blood Culture results may not be optimal due to an excessive volume of blood received in culture bottles   Culture  Setup Time   Final  GRAM POSITIVE COCCI IN BOTH AEROBIC AND ANAEROBIC BOTTLES CRITICAL RESULT CALLED TO, READ BACK BY AND VERIFIED WITH: MATT MCBANE AT [id number] ON 01/24/17 [place].    Culture STAPHYLOCOCCUS AUREUS (A)  Final   Report Status 01/26/2017 FINAL  Final   Organism ID, Bacteria STAPHYLOCOCCUS AUREUS  Final      Susceptibility   Staphylococcus aureus - MIC*    CIPROFLOXACIN <=0.5 SENSITIVE Sensitive     ERYTHROMYCIN <=0.25 SENSITIVE Sensitive     GENTAMICIN <=0.5 SENSITIVE Sensitive     OXACILLIN <=0.25 SENSITIVE Sensitive     TETRACYCLINE <=1 SENSITIVE Sensitive     VANCOMYCIN <=0.5 SENSITIVE Sensitive     TRIMETH/SULFA <=10 SENSITIVE Sensitive     CLINDAMYCIN <=0.25 SENSITIVE Sensitive     RIFAMPIN <=0.5 SENSITIVE Sensitive     Inducible Clindamycin NEGATIVE Sensitive     * STAPHYLOCOCCUS AUREUS  CULTURE, BLOOD (ROUTINE X 2) w Reflex to ID Panel     Status: Abnormal   Collection Time: 01/23/17 11:44 AM  Result Value Ref Range Status   Specimen Description BLOOD BLOOD LEFT FOREARM  Final   Special Requests   Final    BOTTLES DRAWN AEROBIC AND ANAEROBIC Blood Culture results may not be  optimal due to an excessive volume of blood received in culture bottles   Culture  Setup Time   Final    GRAM POSITIVE COCCI IN BOTH AEROBIC AND ANAEROBIC BOTTLES CRITICAL VALUE NOTED.  VALUE IS CONSISTENT WITH PREVIOUSLY REPORTED AND CALLED VALUE.    Culture (A)  Final    STAPHYLOCOCCUS AUREUS SUSCEPTIBILITIES PERFORMED ON PREVIOUS CULTURE WITHIN THE LAST 5 DAYS. Performed at [facility], [place] [address] [place], [place] [address]    Report Status 01/26/2017 FINAL  Final  Blood Culture ID Panel (Reflexed)     Status: Abnormal   Collection Time: 01/23/17 11:44 AM  Result Value Ref Range Status   Enterococcus species NOT DETECTED NOT DETECTED Final   Listeria monocytogenes NOT DETECTED NOT DETECTED Final   Staphylococcus species DETECTED (A) NOT DETECTED Final    Comment: CRITICAL RESULT CALLED TO, READ BACK BY AND VERIFIED WITH: MATT MCBANE AT 0700 ON 01/24/17 [place].    Staphylococcus aureus DETECTED (A) NOT DETECTED Final    Comment: Methicillin (oxacillin) susceptible Staphylococcus aureus (MSSA). Preferred therapy is anti staphylococcal beta lactam antibiotic (Cefazolin or Nafcillin), unless clinically contraindicated. CRITICAL RESULT CALLED TO, READ BACK BY AND VERIFIED WITH: MATT MCBANE AT 0700 ON 01/24/17 [place].    Methicillin resistance NOT DETECTED NOT DETECTED Final   Streptococcus species NOT DETECTED NOT DETECTED Final   Streptococcus agalactiae NOT DETECTED NOT DETECTED Final   Streptococcus pneumoniae NOT DETECTED NOT DETECTED Final   Streptococcus pyogenes NOT DETECTED NOT DETECTED Final   Acinetobacter baumannii NOT DETECTED NOT DETECTED Final   Enterobacteriaceae species NOT DETECTED NOT DETECTED Final   Enterobacter cloacae complex NOT DETECTED NOT DETECTED Final   Escherichia coli NOT DETECTED NOT DETECTED Final   Klebsiella oxytoca NOT DETECTED NOT DETECTED Final   Klebsiella pneumoniae NOT DETECTED NOT DETECTED Final   Proteus species NOT DETECTED NOT  DETECTED Final   Serratia marcescens NOT DETECTED NOT DETECTED Final   Haemophilus influenzae NOT DETECTED NOT DETECTED Final   Neisseria meningitidis NOT DETECTED NOT DETECTED Final   Pseudomonas aeruginosa NOT DETECTED NOT DETECTED Final   Candida albicans NOT DETECTED NOT DETECTED Final   Candida glabrata NOT DETECTED NOT DETECTED Final   Candida krusei NOT DETECTED NOT DETECTED Final  Candida parapsilosis NOT DETECTED NOT DETECTED Final   Candida tropicalis NOT DETECTED NOT DETECTED Final  C difficile quick scan w PCR reflex     Status: None   Collection Time: 01/23/17  1:24 PM  Result Value Ref Range Status   C Diff antigen NEGATIVE NEGATIVE Final   C Diff toxin NEGATIVE NEGATIVE Final   C Diff interpretation No C. difficile detected.  Final  MRSA PCR Screening     Status: None   Collection Time: 01/23/17  2:57 PM  Result Value Ref Range Status   MRSA by PCR NEGATIVE NEGATIVE Final    Comment:        The GeneXpert MRSA Assay (FDA approved for NASAL specimens only), is one component of a comprehensive MRSA colonization surveillance program. It is not intended to diagnose MRSA infection nor to guide or monitor treatment for MRSA infections.   Culture, blood (single) w Reflex to ID Panel     Status: None (Preliminary result)   Collection Time: 01/24/17  4:34 PM  Result Value Ref Range Status   Specimen Description BLOOD BLOOD RIGHT HAND  Final   Special Requests   Final    BOTTLES DRAWN AEROBIC AND ANAEROBIC Blood Culture adequate volume   Culture NO GROWTH 4 DAYS  Final   Report Status PENDING  Incomplete  Cath Tip Culture     Status: None (Preliminary result)   Collection Time: 01/24/17  4:42 PM  Result Value Ref Range Status   Specimen Description CATH TIP  Final   Special Requests NONE  Final   Culture   Final    >100,000 CFU STAPHYLOCOCCUS AUREUS SUSCEPTIBILITIES TO FOLLOW Performed at [facility], [place] [address] [place], [place] [address]    Report  Status PENDING  Incomplete  Gastrointestinal Panel by PCR , Stool     Status: Abnormal   Collection Time: 01/24/17  9:23 PM  Result Value Ref Range Status   Campylobacter species NOT DETECTED NOT DETECTED Final   Plesimonas shigelloides NOT DETECTED NOT DETECTED Final   Salmonella species NOT DETECTED NOT DETECTED Final   Yersinia enterocolitica NOT DETECTED NOT DETECTED Final   Vibrio species NOT DETECTED NOT DETECTED Final   Vibrio cholerae NOT DETECTED NOT DETECTED Final   Enteroaggregative E coli (EAEC) NOT DETECTED NOT DETECTED Final   Enteropathogenic E coli (EPEC) DETECTED (A) NOT DETECTED Final    Comment: RESULT CALLED TO, READ BACK BY AND VERIFIED WITH: CHELSEA WRENN AT 0011 01/25/17 [place]    Enterotoxigenic E coli (ETEC) NOT DETECTED NOT DETECTED Final   Shiga like toxin producing E coli (STEC) NOT DETECTED NOT DETECTED Final   Shigella/Enteroinvasive E coli (EIEC) NOT DETECTED NOT DETECTED Final   Cryptosporidium NOT DETECTED NOT DETECTED Final   Cyclospora cayetanensis NOT DETECTED NOT DETECTED Final   Entamoeba histolytica NOT DETECTED NOT DETECTED Final   Giardia lamblia NOT DETECTED NOT DETECTED Final   Adenovirus F40/41 NOT DETECTED NOT DETECTED Final   Astrovirus NOT DETECTED NOT DETECTED Final   Norovirus GI/GII NOT DETECTED NOT DETECTED Final   Rotavirus A NOT DETECTED NOT DETECTED Final   Sapovirus (I, II, IV, and V) NOT DETECTED NOT DETECTED Final    Coagulation Studies: No results for input(s): LABPROT, INR in the last 72 hours.  Urinalysis: No results for input(s): COLORURINE, LABSPEC, PHURINE, GLUCOSEU, HGBUR, BILIRUBINUR, KETONESUR, PROTEINUR, UROBILINOGEN, NITRITE, LEUKOCYTESUR in the last 72 hours.  Invalid input(s): APPERANCEUR    Imaging: No results found.  Medications:   .  ceFAZolin (ANCEF) IV Stopped (01/27/17 1843)   . aspirin EC  81 mg Oral Daily  . atorvastatin  80 mg Oral Daily  . epoetin (EPOGEN/PROCRIT) injection  4,000 Units  Intravenous Q T,Th,Sa-HD  . feeding supplement (ENSURE ENLIVE)  237 mL Oral BID BM  . fentaNYL      . gabapentin  100 mg Oral QHS  . heparin  4,000 Units Intracatheter Once  . insulin aspart  0-5 Units Subcutaneous QHS  . insulin aspart  0-9 Units Subcutaneous TID WC  . levothyroxine  50 mcg Oral QAC breakfast  . midazolam      . multivitamin  1 tablet Oral QHS  . sertraline  25 mg Oral Daily  . sodium chloride flush  10-40 mL Intracatheter Q12H  . sodium chloride flush      . vitamin C  500 mg Oral Daily   acetaminophen, albuterol, nitroGLYCERIN, ondansetron (ZOFRAN) IV, oxyCODONE, sodium chloride flush  Assessment/ Plan:  56 y.o. caucasian male with end stage renal disease on hemodialysis, diabetes mellitus type II insulin dependent, diabetic peripheral neuropathy, right toe amputation, hypertension, hyperlipidemia, COPD/tobacco use, depression, coronary artery disease  TTS [facility] RIJ permcath  1. ESRD 2, Sepsis, Staph aureus (MSSA). Cath tip culture is also POS for S Aureus 3. Acute coronary syndrome/ NSTEMI/ Followed by Dr Ubaldo Glassing 4. Edema 5. AoCKD 6. SHPTH  Plan: Tunnel dialysis catheter has been removed.  Temporary dialysis catheter in place Antibiotics as per ICU and ID team; currently getting cefazolin Plan for dialysis today and Saturday and then remove the dialysis catheter. PermCath to be placed on Monday EPO with HD ACS, [facility] cardiology following; TEE done 12/7; results negative Plan for heart catheterization once sepsis is resolved 2 D echo shows EF 50-55%.  Phos 3.6, monitor    LOS: [place] 12/7/201811:16 AM  [facility] [place], [place]

## 2017-01-28 NOTE — Progress Notes (Signed)
[facility] Encounter Note  Patient: Brian [name] / Admit Date: 01/23/2017 / Date of Encounter: 01/28/2017, 8:22 AM   Subjective: Patient has significant improvements from admission from primary sepsis.  Patient had a significant myocardial infarction but has no residual chest discomfort at this time.  Patient also had heart failure with improvements with changes and fluid balances at this time.  Patient has blood cultures that are negative after significant sepsis but there is concern of endocarditis prior to placing a new catheter Tee shows mild global lv dysfx with ef 45% and mild valve insufficiency but no current evidence of vegetaion and or endocarditis  Review of Systems: Positive for: Shortness of breath Negative for: Vision change, hearing change, syncope, dizziness, nausea, vomiting,diarrhea, bloody stool, stomach pain, cough, congestion, diaphoresis, urinary frequency, urinary pain,skin lesions, skin rashes Others previously listed  Objective: Telemetry: Sinus rhythm Physical Exam: Blood pressure 131/73, pulse 96, temperature 98.6 F (37 C), temperature source Oral, resp. rate 20, height 5\' 10"  (1.778 m), weight (!) 149.7 kg (330 lb), SpO2 93 %. Body mass index is 47.35 kg/m. General: Well developed, well nourished, in no acute distress. Head: Normocephalic, atraumatic, sclera non-icteric, no xanthomas, nares are without discharge. Neck: No apparent masses Lungs: Normal respirations with no wheezes, no rhonchi, no rales , few crackles   Heart: Regular rate and rhythm, normal S1 S2, no murmur, no rub, no gallop, PMI is normal size and placement, carotid upstroke normal without bruit, jugular venous pressure normal Abdomen: Soft, non-tender, non-distended with normoactive bowel sounds. No hepatosplenomegaly. Abdominal aorta is normal size without bruit Extremities: 1-2+ edema, no clubbing, no cyanosis, no ulcers,  Peripheral: 2+ radial, 2+ femoral, 2+  dorsal pedal pulses Neuro: Alert and oriented. Moves all extremities spontaneously. Psych:  Responds to questions appropriately with a normal affect.   Intake/Output Summary (Last 24 hours) at 01/28/2017 3664 Last data filed at 01/27/2017 2100 Gross per 24 hour  Intake 100 ml  Output 150 ml  Net -50 ml    Inpatient Medications:  . [MAR Hold] aspirin EC  81 mg Oral Daily  . [MAR Hold] atorvastatin  80 mg Oral Daily  . [MAR Hold] epoetin (EPOGEN/PROCRIT) injection  4,000 Units Intravenous Q T,Th,Sa-HD  . fentaNYL      . [MAR Hold] gabapentin  100 mg Oral QHS  . [MAR Hold] heparin  4,000 Units Intracatheter Once  . [MAR Hold] insulin aspart  0-5 Units Subcutaneous QHS  . [MAR Hold] insulin aspart  0-9 Units Subcutaneous TID WC  . [MAR Hold] levothyroxine  50 mcg Oral QAC breakfast  . midazolam      . [MAR Hold] sertraline  25 mg Oral Daily  . [MAR Hold] sodium chloride flush  10-40 mL Intracatheter Q12H  . sodium chloride flush       Infusions:  . sodium chloride    . [MAR Hold]  ceFAZolin (ANCEF) IV    . [MAR Hold]  ceFAZolin (ANCEF) IV Stopped (01/27/17 1843)  . heparin 2,750 Units/hr (01/28/17 0013)    Labs: Recent Labs    01/26/17 0357 01/27/17 0516 01/28/17 0611  NA 134* 134*  --   K 3.1* 3.1*  --   CL 101 100*  --   CO2 22 22  --   GLUCOSE 144* 134*  --   BUN 38* 45*  --   CREATININE 4.42* 5.53*  --   CALCIUM 7.7* 8.1*  --   MG 1.8 1.9 1.8  PHOS 4.4 4.9*  3.6   No results for input(s): AST, ALT, ALKPHOS, BILITOT, PROT, ALBUMIN in the last 72 hours. Recent Labs    01/27/17 0516 01/28/17 0611  WBC 8.9 10.1  HGB 8.5* 8.6*  HCT 25.4* 25.7*  MCV 89.7 89.0  PLT 157 190   No results for input(s): CKTOTAL, CKMB, TROPONINI in the last 72 hours. Invalid input(s): POCBNP No results for input(s): HGBA1C in the last 72 hours.   Weights: Filed Weights   01/27/17 8144 01/27/17 1345 01/28/17 0732  Weight: (!) 149.7 kg (330 lb 1.6 oz) (!) 149.7 kg (330 lb 1.6 oz)  (!) 149.7 kg (330 lb)     Radiology/Studies:  Dg Chest 1 View  Result Date: 01/25/2017 CLINICAL DATA:  Central line placed EXAM: CHEST 1 VIEW COMPARISON:  Portable chest x-ray of 01/23/2017 FINDINGS: The tip of the left IJ central venous line is at the approximate junction of the left innominate vein and SVC. No pneumothorax is seen. There is cardiomegaly present with probable mild pulmonary vascular congestion. No pleural effusion is seen. Right central venous line tip is unchanged overlying the mid SVC. IMPRESSION: 1. Left central venous line tip overlies the junction of the left innominate vein and SVC. 2. Cardiomegaly and mild pulmonary vascular congestion. 3. Right central venous line tip overlies the mid SVC. Electronically Signed   By: Ivar Drape M.D.   On: 01/25/2017 16:53   Dg Chest 1 View  Result Date: 01/23/2017 CLINICAL DATA:  Central line placement. EXAM: CHEST 1 VIEW COMPARISON:  01/23/2017 and prior radiographs FINDINGS: A left IJ central venous catheter is now noted with tip overlying upper SVC. A right IJ central venous catheter is again noted with tip overlying the mid SVC. Cardiomegaly and pulmonary vascular congestion again identified. Slight decrease in interstitial opacities/edema noted. There is no evidence of pneumothorax. IMPRESSION: Left IJ central venous catheter placement with tip overlying the upper SVC. No evidence of pneumothorax. Slight decrease in interstitial opacities/edema. Cardiomegaly and pulmonary vascular congestion. Electronically Signed   By: Margarette Canada M.D.   On: 01/23/2017 16:26   [place] Ue Vein Mapping Left  Result Date: 01/05/2017 CLINICAL DATA:  End-stage renal disease EXAM: [place] EXTREM UP VEIN MAPPING COMPARISON:  None. FINDINGS: LEFT ARTERIES Wrist Radial Artery: Size 1.35mm  Waveform triphasic Wrist Ulnar Artery: Size 1.58mm  Waveform triphasic Prox. Forearm Radial Artery: Size 1.64mm  Waveform triphasic Upper Arm Brachial Artery: Size 5.60mm  Waveform  triphasic LEFT VEINS Forearm Cephalic Vein: Prox 8.1EH Distal 2.21mm Depth 6.3-1.4HF Forearm Basilic Vein: Prox diminutive Distal diminutive Upper Arm Cephalic Vein: Prox 0.2OV Distal 4.51mm Depth 13.7 -4.25mm There is a segment of wall thickening without occlusion All Upper Arm Basilic Vein: Prox 56mm Distal 1.23mm Depth 15.3 -6.37mm Upper Arm Brachial Vein: Prox 6.25mm Distal 3.59mm Depth 0.2 0.1 -15.75mm ADDITIONAL LEFT VEINS Axillary Vein:  9.49mm Subclavian Vein: Patient: Yes Respiratory Phasicity: Present Internal Jugular Vein: Patent: Yes    Respiratory Phasicity: Present Branches > 2 mm: At least 1 from the cephalic vein in the upper arm, 3 mm At least 1 from the basilic vein, upper arm, 1.4 mm At least 4 from the brachial vein in the upper arm, 2.3-4.5 mm Note made of patent right subclavian and IJ veins with respiratory phasicity. IMPRESSION: Patent left upper extremity vasculature as detailed above. Electronically Signed   By: Lucrezia Europe M.D.   On: 01/05/2017 09:03   Dg Chest Port 1 View  Result Date: 01/23/2017 CLINICAL DATA:  Cough, fever EXAM:  PORTABLE CHEST 1 VIEW COMPARISON:  12/31/2016 FINDINGS: Right dialysis catheter in place with the tip at the cavoatrial junction. Cardiomegaly with diffuse interstitial opacities likely edema. No visible effusions or acute bony abnormality. IMPRESSION: Cardiomegaly. Diffuse interstitial opacities in the mid and lower lungs, likely edema/ CHF. Electronically Signed   By: Rolm Baptise M.D.   On: 01/23/2017 12:27   Dg Chest Port 1 View  Result Date: 12/31/2016 CLINICAL DATA:  Central line placement EXAM: PORTABLE CHEST 1 VIEW COMPARISON:  12/29/2016 FINDINGS: Right jugular central venous catheter tip in the proximal SVC. No pneumothorax Progression of pulmonary vascular congestion and mild edema. No effusion. IMPRESSION: Central line placement in the proximal SVC.  No pneumothorax Progression of heart failure with mild edema. Electronically Signed   By: Franchot Gallo  M.D.   On: 12/31/2016 15:01   Dg Chest Portable 1 View  Result Date: 12/29/2016 CLINICAL DATA:  Initial evaluation for acute shortness of breath. History of CHF, COPD, hypertension. EXAM: PORTABLE CHEST 1 VIEW COMPARISON:  Prior radiograph from 02/26/2016. FINDINGS: Moderate cardiomegaly, stable. Apparent widening of the mediastinum favored to be related to AP technique, shallow lung inflation, and lordotic angulation. Lungs normally inflated. Diffuse pulmonary vascular and interstitial congestion without frank pulmonary edema. Underlying COPD. No focal infiltrates. No appreciable pleural effusion. Mild right basilar subsegmental atelectasis. No pneumothorax. No acute osseus abnormality. IMPRESSION: 1. Cardiomegaly with mild diffuse pulmonary interstitial congestion without frank pulmonary edema. 2. Mild right basilar atelectasis. Electronically Signed   By: Jeannine Boga M.D.   On: 12/29/2016 17:14   Dg Hip Unilat W Or Wo Pelvis 2-3 Views Left  Result Date: 12/29/2016 CLINICAL DATA:  Initial evaluation for multiple recent falls, pain. EXAM: DG HIP (WITH OR WITHOUT PELVIS) 2-3V LEFT COMPARISON:  Prior radiograph from 07/12/2016. FINDINGS: Left hip in slight internal rotation. No acute fracture dislocation. Femoral head in normal alignment with the acetabulum. Heterotopic calcification adjacent to the greater trochanter is stable. Bony pelvis intact. SI joints approximated. Mild degenerative osteoarthrosis. Degenerative changes noted within lower lumbar spine. No acute soft tissue abnormality.  Atherosclerosis. IMPRESSION: 1. No acute osseous abnormality about the left hip. 2. Mild degenerative osteoarthrosis. Electronically Signed   By: Jeannine Boga M.D.   On: 12/29/2016 17:12   Dg Hip Unilat  With Pelvis 2-3 Views Right  Result Date: 12/29/2016 CLINICAL DATA:  Initial evaluation for multiple recent falls, acute right hip pain. EXAM: DG HIP (WITH OR WITHOUT PELVIS) 2-3V RIGHT COMPARISON:   None. FINDINGS: No acute fracture or dislocation. Femoral head in normal alignment with the acetabulum. Femoral head height preserved. Bony pelvis intact. SI joints approximated. Mild degenerative osteoarthrosis about the hips bilaterally. Degenerative changes noted within lower lumbar spine. No acute soft tissue abnormality.  Atherosclerosis noted. IMPRESSION: 1. No acute osseous abnormality about the right hip. 2. Mild degenerative osteoarthrosis. Electronically Signed   By: Jeannine Boga M.D.   On: 12/29/2016 17:10     Assessment and Recommendation  56 y.o. male with known coronary artery disease elevation myocardial infarction after significant septic episode and bacteremia with diastolic dysfunction heart failure and end-stage renal disease with COPD slightly recovering and needing further evaluation and treatment options of possible residual endocarditis prior to new catheter placement and TEE without vegetaion 1.  Continue supportive care and treatment with antibiotics for previous sepsis and bacteremia 2.  continue furosemide as necessary for congestive heart failure 3.  Atorvastatin for high intensity cholesterol therapy 4.  No further cardiac intervention at this  time due to no cocnerns of cardiac vegetation and no cardiac sx from nstemi 5. Rehab and other procedures without restriction 6. outpt Fu 2wks for futher cardiac rehab  Signed, Serafina Royals M.D. FACC

## 2017-01-28 NOTE — Consult Note (Signed)
Pharmacy Antibiotic Note  Brian [name] is a 56 y.o. male admitted on 01/23/2017 with bacteremia.  Pharmacy has been consulted for cefazolin dosing. Pt on HD  Plan: cefazolin 2g q T, Thus, Sat w/ HD  Height: 5\' 10"  (177.8 cm) Weight: (!) 330 lb (149.7 kg) IBW/kg (Calculated) : 73  Temp (24hrs), Avg:98.2 F (36.8 C), Min:97.4 F (36.3 C), Max:98.8 F (37.1 C)  Recent Labs  Lab 01/23/17 1144 01/24/17 0253 01/25/17 0517 01/26/17 0357 01/27/17 0516 01/28/17 0611  WBC 9.9 7.2 7.2 6.4 8.9 10.1  CREATININE 4.92* 5.43* 6.61* 4.42* 5.53*  --   LATICACIDVEN 1.5  --   --   --   --   --     Estimated Creatinine Clearance: 21.6 mL/min (A) (by C-G formula based on SCr of 5.53 mg/dL (H)).    Allergies  Allergen Reactions  . Naproxen Rash    Antimicrobials this admission: vanc 12/2 >> 12/3 zosyn 12/2 >> 12/3 Cefazolin 12/3>>  Dose adjustments this admission:   Microbiology results: 12/2 BCx: MSSA 12/3 cath tip: staph aureus 12/3 Colbert: NG  Thank you for allowing pharmacy to be a part of this patient's care.  Ramond Dial, Pharm.D, BCPS Clinical Pharmacist  01/28/2017 1:56 PM

## 2017-01-28 NOTE — Progress Notes (Signed)
ANTICOAGULATION CONSULT NOTE - Follow Up Consult  Pharmacy Consult for Heparin  Indication: chest pain/ACS  Allergies  Allergen Reactions  . Naproxen Rash    Patient Measurements: Height: 5\' 10"  (177.8 cm) Weight: (unable to weigh pt, in chair) IBW/kg (Calculated) : 73 Heparin Dosing Weight: 107.4 kg   Vital Signs: Temp: 98.3 F (36.8 C) (12/07 0431) Temp Source: Oral (12/07 0431) BP: 140/74 (12/07 0431) Pulse Rate: 91 (12/07 0431)  Labs: Recent Labs    01/26/17 0357  01/27/17 0516 01/27/17 1411 01/28/17 0021 01/28/17 0611  HGB 7.6*  --  8.5*  --   --  8.6*  HCT 22.8*  --  25.4*  --   --  25.7*  PLT 137*  --  157  --   --  190  HEPARINUNFRC  --    < > 0.25* 0.23* 0.33 0.38  CREATININE 4.42*  --  5.53*  --   --   --    < > = values in this interval not displayed.    Estimated Creatinine Clearance: 21.6 mL/min (A) (by C-G formula based on SCr of 5.53 mg/dL (H)).   Medications:  Scheduled:  . butamben-tetracaine-benzocaine      . fentaNYL      . lidocaine      . midazolam      . sodium chloride flush      . aspirin EC  81 mg Oral Daily  . atorvastatin  80 mg Oral Daily  . epoetin (EPOGEN/PROCRIT) injection  4,000 Units Intravenous Q T,Th,Sa-HD  . gabapentin  100 mg Oral QHS  . heparin  4,000 Units Intracatheter Once  . insulin aspart  0-5 Units Subcutaneous QHS  . insulin aspart  0-9 Units Subcutaneous TID WC  . levothyroxine  50 mcg Oral QAC breakfast  . sertraline  25 mg Oral Daily  . sodium chloride flush  10-40 mL Intracatheter Q12H    Assessment: Pt is a 56 year old obese male who was brought in by EMS with generalized weakness, hypotension. Pt found to have a troponin of >65. Pharmacy consulted to dose heparin drip with no bolus- per Dr. Margaretmary Eddy.      Goal of Therapy:  Heparin level 0.3-0.7 units/ml Monitor platelets by anticoagulation protocol: Yes   Plan:  12/5:  HL @ 20:00 = 0.24 Will order Heparin 1600 units IV X 1 and increase drip rate  to 2500 units/hr.  Will recheck HL 8 hrs after rate change.   12/6 @ 0500 HL 0.25 subtherapeutic. Will rebolus w/ heparin 1500 units IV x 1 and increase rate to 2600 units/hr and recheck HL @ 1100  12/6 at 1411 HL 0.23 subtherapeutic. Will increase to heparin 2750 units/hr and recheck in 8h. CBC in AM. Pt with consistently low HL despite increasing drip rate. Will order antithrombin III level (to be followed up in AM) to assess if that is a possible mechanism for heparin resistance.   12/7 @ 0021 HL 0.33 therapeutic. Will continue current rate and will f/u anti-Xa @ 0600.   12/8 @ 0600 HL 0.38 therapeutic. Will continue current rate and will recheck HL/CBC w/ am labs, CBC appears stable.  Tobie Lords, PharmD, BCPS Clinical Pharmacist 01/28/2017

## 2017-01-28 NOTE — Progress Notes (Signed)
Initial Nutrition Assessment  DOCUMENTATION CODES:   Morbid obesity  INTERVENTION:   Ensure Enlive po BID, each supplement provides 350 kcal and 20 grams of protein  Renal MVI PO daily  Vitamin C 561m po daily   NUTRITION DIAGNOSIS:   Increased nutrient needs related to catabolic illness(ESRD on HD) as evidenced by increased estimated needs from protein.  GOAL:   Patient will meet greater than or equal to 90% of their needs  MONITOR:   PO intake, Supplement acceptance, Labs, Weight trends, Skin, I & O's  REASON FOR ASSESSMENT:   LOS    ASSESSMENT:   56y.o. male with a known history of COPD, chronic diastolic congestive heart failure, end-stage renal disease recently started on hemodialysis was just admitted to hospital on 12/29/2016 and discharged on 01/06/2017 with CHF exacerbation is presenting to the ED with a chief complaint of generalized weakness. Pt with R permacath now with MSSA bacteremia, sepsis and NSTEMI.    Met with pt in room today. RD familiar with this pt from previous admits. Pt reports good appetite and oral intake pta. Pt eating 100% of meals in hospital. Pt reports he has been drinking chocolate and strawberry Ensure at home but he has not been taking any MVIs. RD educated pt on the importance of adequate nutrition with HD. RD will order supplements and MVIs. Pt at high risk for scurvy r/t chronic HD and pt does not eat fruits and vegetables. RD will order Vitamin C daily. Per chart, pt appears to have weight loss of 14lbs(4%) since last admit 3 weeks ago. It is difficult to determine if any true weight loss r/t fluid changes and edema. Pt denies any weight loss. Pt with low K today; monitor and supplement as needed per MD discretion.   Medications reviewed and include: aspirin, epoetin, heparin, insulin, synthroid, cefazolin, fentanyl, oxycodone   Labs reviewed: N 134(L), K 3.1(L), Cl 100(L), BUN 45(H), creat 5.53(H), Ca 8.1(L), P 3.6 wnl, Mg 1.8  wnl Iron 36(L) Hgb 8.6(L), Hct 25.7(L) cbgs- 159, 144, 134 x 48hrs AIC 6.8(H)- 11/1 iPTH- 228(H)- 11/9  Nutrition-Focused physical exam completed. Findings are no fat depletion, no muscle depletion, and moderate edema BLE.   Diet Order: NPO  EDUCATION NEEDS:   Education needs have been addressed  Skin:  Reviewed RN Assessment  Last BM:  12/6- type 7  Height:   Ht Readings from Last 1 Encounters:  01/28/17 5' 10" (1.778 m)    Weight:   Wt Readings from Last 1 Encounters:  01/28/17 (!) 330 lb (149.7 kg)    Ideal Body Weight:  75.45 kg  BMI:  Body mass index is 47.35 kg/m.  Estimated Nutritional Needs:   Kcal:  2300-2600kcal/day   Protein:  >160g/day   Fluid:  per MD  CKoleen DistanceMS, RD, LDN Pager #[phone] Hours Pager: [phone]

## 2017-01-28 NOTE — Care Management (Signed)
Patient admitted for NSTEMI.  Found to be in septic shock with positive blood cultures.  Patient lives at home with wife. PCP [facility].  Pharmacy [facility].  HD laision is aware of admission.  Patient is on 3 liters chronic O2 through [place].  States he has a walker, shower chair, and can in the home.  PT has assessed patient and recommends SNF.  However patient wishes to return home, and due to his Medicaid coverage [place] out of county and this would effect his outpatient HD placement.  Plan for patient to have perm cath replaced on Monday.  Anticipate discharge to home.  Patient in agreement to home health services.  Provided home health agency preference.  Patient states that he does not have a preference of agency.  Per ID   2 weeks IV ancef which can be given at HD. RNCM following

## 2017-01-28 NOTE — Clinical Social Work Note (Signed)
Patient's wife asked to speak with CSW when she arrived this afternoon. She informed CSW that her husband's lawyer had stated patient had medicare. They do not have a medicare card. CSW contacted [facility] admitting and registration and they checked the system to see if patient had medicare and is showed patient was ineligible for medicare and that patient did not have medicare. CSW explained this to patient and wife and patient stated that he would return home with home health. Shela Leff MSW,LCSW [phone]

## 2017-01-28 NOTE — Clinical Social Work Note (Signed)
Clinical Social Work Assessment  Patient Details  Name: Brian [name] MRN: [id number] Date of Birth: 1960/06/22  Date of referral:  01/28/17               Reason for consult:  Discharge Planning                Permission sought to share information with:  Chartered certified accountant granted to share information::  Yes, Verbal Permission Granted  Name::        Agency::     Relationship::     Contact Information:     Housing/Transportation Living arrangements for the past 2 months:  Single Family Home Source of Information:  Patient Patient Interpreter Needed:  None Criminal Activity/Legal Involvement Pertinent to Current Situation/Hospitalization:  No - Comment as needed Significant Relationships:  Spouse Lives with:  Self Do you feel safe going back to the place where you live?  Yes Need for family participation in patient care:  Yes (Comment)  Care giving concerns:  Patient resides at home with his spouse.   Social Worker assessment / plan:  PT has recommended STR. Patient has medicaid. CSW spoke with patient this afternoon and introduced self and explained role and purpose of visit. Patient stated that if a bed could not be found in county, that he would return home with home health services. CSW will initiate bed search today.  Employment status:  Disabled (Comment on whether or not currently receiving Disability) Insurance information:  Medicaid In [place] PT Recommendations:  [place] / Referral to community resources:     Patient/Family's Response to care:  Patient expressed appreciation for CSW assistance.  Patient/Family's Understanding of and Emotional Response to Diagnosis, Current Treatment, and Prognosis:  Patient is aware of his limitations and believes that if he does not go to STR he will be able to return home.  Emotional Assessment Appearance:  Appears older than stated age Attitude/Demeanor/Rapport:  (pleasant and  cooperative) Affect (typically observed):  Accepting, Calm Orientation:  Oriented to Self, Oriented to Place, Oriented to  Time, Oriented to Situation Alcohol / Substance use:  Not Applicable Psych involvement (Current and /or in the community):  No (Comment)  Discharge Needs  Concerns to be addressed:  Care Coordination Readmission within the last 30 days:  No Current discharge risk:  None Barriers to Discharge:  No Barriers Identified   Shela Leff, LCSW 01/28/2017, 12:08 PM

## 2017-01-28 NOTE — Progress Notes (Signed)
[place] at [place] NAME: Brian [name]    MR#:  [id number]  DATE OF BIRTH:  03/15/1960  SUBJECTIVE:  CHIEF COMPLAINT:   Chief Complaint  Patient presents with  . Weakness   Generalized weakness and better diarrhea.  S/p TEE, he was on BIPAP after TEE, now on O2 [place] 4.5 L. REVIEW OF SYSTEMS:  Review of Systems  Constitutional: Positive for malaise/fatigue. Negative for chills and fever.  HENT: Negative for sore throat.   Eyes: Negative for blurred vision and double vision.  Respiratory: Negative for cough, hemoptysis, shortness of breath, wheezing and stridor.   Cardiovascular: Negative for chest pain, palpitations, orthopnea and leg swelling.  Gastrointestinal: Positive for diarrhea. Negative for abdominal pain, blood in stool, melena, nausea and vomiting.  Genitourinary: Negative for dysuria, flank pain and hematuria.  Musculoskeletal: Negative for back pain and joint pain.  Neurological: Positive for weakness. Negative for dizziness, sensory change, focal weakness, seizures, loss of consciousness and headaches.  Endo/Heme/Allergies: Negative for polydipsia.  Psychiatric/Behavioral: Negative for depression. The patient is not nervous/anxious.     DRUG ALLERGIES:   Allergies  Allergen Reactions  . Naproxen Rash   VITALS:  Blood pressure 138/86, pulse 97, temperature 98.5 F (36.9 C), temperature source Oral, resp. rate 18, height 5\' 10"  (1.778 m), weight (!) 330 lb (149.7 kg), SpO2 96 %. PHYSICAL EXAMINATION:  Physical Exam  Constitutional: He is oriented to person, place, and time and well-developed, well-nourished, and in no distress.  Morbid obese  HENT:  Head: Normocephalic.  Eyes: Conjunctivae and EOM are normal. Pupils are equal, round, and reactive to light. No scleral icterus.  Neck: Normal range of motion. Neck supple. No JVD present. No tracheal deviation present.  Cardiovascular: Normal rate, regular rhythm and  normal heart sounds. Exam reveals no gallop.  No murmur heard. Pulmonary/Chest: Effort normal. No respiratory distress. He has no wheezes. He has no rales.  Diminished lung sounds  Abdominal: Soft. Bowel sounds are normal. He exhibits no distension. There is no tenderness. There is no rebound.  Musculoskeletal: Normal range of motion. He exhibits no edema or tenderness.  Neurological: He is alert and oriented to person, place, and time. No cranial nerve deficit.  Skin: No rash noted. No erythema.  Psychiatric: Affect normal.   LABORATORY PANEL:  Male CBC Recent Labs  Lab 01/28/17 0611  WBC 10.1  HGB 8.6*  HCT 25.7*  PLT 190   ------------------------------------------------------------------------------------------------------------------ Chemistries  Recent Labs  Lab 01/23/17 1144  01/27/17 0516 01/28/17 0611  NA 132*   < > 134*  --   K 4.3   < > 3.1*  --   CL 100*   < > 100*  --   CO2 20*   < > 22  --   GLUCOSE 182*   < > 134*  --   BUN 52*   < > 45*  --   CREATININE 4.92*   < > 5.53*  --   CALCIUM 8.5*   < > 8.1*  --   MG  --    < > 1.9 1.8  AST 119*  --   --   --   ALT 30  --   --   --   ALKPHOS 68  --   --   --   BILITOT 0.4  --   --   --    < > = values in this interval not displayed.   RADIOLOGY:  No results found. ASSESSMENT AND PLAN:   Brian [name]  is a 56 y.o. male with a known history of COPD, chronic diastolic congestive heart failure, end-stage renal disease recently started on hemodialysis was just admitted to hospital on 12/29/2016 and discharged on 01/06/2017 with CHF exacerbation is presenting to the ED with a chief complaint of generalized weakness. By the time he came into the hospital he is hypotensive. Patient's initial troponin is at 65 EKG with no ST elevations.  #Non-STEMI Discontinue heparin drip, continue ASA and atorvastatin, echo is unremarkable. Per Dr. Ubaldo Glassing, consider cath when septic picture improves to evaluate anatomy. Not  candidate for beta blockers and ace I given hemodynamics.   #Septic shock with MSSA bacteremia, due to permacath infection. Off Levophed drip.  Blood cultures show positive MSSA. He was on broad-spectrum IV antibiotic Zosyn and vancomycin.  Per Dr. Ola Spurr, changed to ancef, Min 2 weeks IV abx will be needed.  TEE if TTE neg for vegetations.  Removed permcath.  NO GROWTH 4 DAYS per repeated blood culture. TEE is unremarkable. Permacath on Monday.   ESRD. Temporary dialysis cathter placed and HD as scheduled per Dr. Candiss Norse.  Hyponatremia.  Improved.  Diarrhea. Negative C. difficile test. Enteropathogenic E coli (EPEC)  #Chronic diastolic congestive heart failure without fluid overload  #Chronic history of obstructive sleep apnea Patient is noncompliant with CPAP machine Continue oxygen via nasal cannula  Anemia of chronic disease.  Stable.  EPO with HD.  Hypokalemia.  Adjust potassium during dialysis.  Discussed with Dr. Candiss Norse and Dr. Ubaldo Glassing. All the records are reviewed and case discussed with Care Management/Social Worker. Management plans discussed with the patient, his wife and they are in agreement.  CODE STATUS: Full Code  TOTAL TIME TAKING CARE OF THIS PATIENT: 36 minutes.   More than 50% of the time was spent in counseling/coordination of care: YES  POSSIBLE D/C IN 3-4 DAYS, DEPENDING ON CLINICAL CONDITION.   Demetrios Loll M.D on 01/28/2017 at 2:08 PM  Between 7am to 6pm - Pager - [phone]  After 6pm go to [email or web address] - [facility]

## 2017-01-28 NOTE — NC FL2 (Signed)
[place] LEVEL OF CARE SCREENING TOOL     IDENTIFICATION  Patient Name: Brian [name] Birthdate: 05-16-1960 Sex: male Admission Date (Current Location): 01/23/2017  [place] and [place] Number:  Engineering geologist and Address:  [facility], [address], [place], [place] [address]      Provider Number: [id number]  Attending Physician Name and Address:  Demetrios Loll, MD  Relative Name and Phone Number:       Current Level of Care: Hospital Recommended Level of Care: [place] Prior Approval Number:    Date Approved/Denied:   PASRR Number:    Discharge Plan: SNF    Current Diagnoses: Patient Active Problem List   Diagnosis Date Noted  . NSTEMI (non-ST elevated myocardial infarction) ([place]) 01/23/2017  . Acute on chronic diastolic CHF (congestive heart failure) ([place]) 12/29/2016  . Lymphedema of both lower extremities 10/14/2016  . Obstructive sleep apnea 09/13/2016  . Chronic diastolic heart failure ([place]) 03/28/2016  . Tobacco use 03/28/2016  . Hyperkalemia 03/28/2016  . COPD (chronic obstructive pulmonary disease) ([place]) 02/08/2016  . DM (diabetes mellitus) type II controlled with renal manifestation ([place]) 01/05/2015  . Neuropathy ([place])   . Clinical depression 01/17/2013  . HLD (hyperlipidemia) 01/17/2013  . HTN (hypertension) 01/17/2013  . Gonalgia 01/17/2013  . Morbid obesity ([place]) 01/17/2013  . Amputated toe ([place]) 01/17/2013  . Diabetes mellitus ([place]) 01/17/2013    Orientation RESPIRATION BLADDER Height & Weight     Self, Time, Situation, Place  Normal, O2(4.5 liters) Continent Weight: (!) 330 lb (149.7 kg) Height:  5\' 10"  (177.8 cm)  BEHAVIORAL SYMPTOMS/MOOD NEUROLOGICAL BOWEL NUTRITION STATUS  (none) (none) Incontinent(cdiff) Diet(renal)  AMBULATORY STATUS COMMUNICATION OF NEEDS Skin   Limited Assist Verbally Normal                       Personal Care Assistance Level of Assistance   Bathing Assistance: Limited assistance Feeding assistance: Limited assistance Dressing Assistance: Limited assistance     Functional Limitations Info  (none)          SPECIAL CARE FACTORS FREQUENCY  (outpatient dialysis)                    Contractures Contractures Info: Not present    Additional Factors Info  Code Status Code Status Info: full             Current Medications (01/28/2017):  This is the current hospital active medication list Current Facility-Administered Medications  Medication Dose Route Frequency Provider Last Rate Last Dose  . acetaminophen (TYLENOL) tablet 650 mg  650 mg Oral Q4H PRN Gouru, Aruna, MD   650 mg at 01/25/17 1600  . albuterol (PROVENTIL) (2.5 MG/3ML) 0.083% nebulizer solution 3 mL  3 mL Inhalation Q4H PRN Gouru, Aruna, MD   3 mL at 01/24/17 2114  . aspirin EC tablet 81 mg  81 mg Oral Daily Gouru, Aruna, MD   81 mg at 01/28/17 1002  . atorvastatin (LIPITOR) tablet 80 mg  80 mg Oral Daily Gouru, Aruna, MD   80 mg at 01/28/17 1002  . ceFAZolin (ANCEF) 2 g in dextrose 5 % 100 mL IVPB  2 g Intravenous Q T,Th,Sat-1800 Demetrios Loll, MD   Stopped at 01/27/17 1843  . epoetin alfa (EPOGEN,PROCRIT) injection 4,000 Units  4,000 Units Intravenous Q T,Th,Sa-HD Murlean Iba, MD   4,000 Units at 01/27/17 1415  . feeding supplement (ENSURE ENLIVE) (ENSURE ENLIVE) liquid 237 mL  237 mL Oral BID BM Demetrios Loll, MD      . feeding supplement (NEPRO CARB STEADY) liquid 237 mL  237 mL Oral BID BM Singh, Harmeet, MD      . fentaNYL (SUBLIMAZE) 100 MCG/2ML injection           . gabapentin (NEURONTIN) capsule 100 mg  100 mg Oral QHS Gouru, Aruna, MD   100 mg at 01/27/17 2104  . heparin injection 4,000 Units  4,000 Units Intracatheter Once Flora Lipps, MD      . insulin aspart (novoLOG) injection 0-5 Units  0-5 Units Subcutaneous QHS Gouru, Aruna, MD      . insulin aspart (novoLOG) injection 0-9 Units  0-9 Units Subcutaneous TID WC Gouru, Aruna, MD   1 Units at  01/28/17 1000  . levothyroxine (SYNTHROID, LEVOTHROID) tablet 50 mcg  50 mcg Oral QAC breakfast Gouru, Aruna, MD   50 mcg at 01/28/17 1002  . midazolam (VERSED) 5 MG/5ML injection           . multivitamin (RENA-VIT) tablet 1 tablet  1 tablet Oral QHS Demetrios Loll, MD      . nitroGLYCERIN (NITROSTAT) SL tablet 0.4 mg  0.4 mg Sublingual Q5 Min x 3 PRN Gouru, Aruna, MD      . ondansetron (ZOFRAN) injection 4 mg  4 mg Intravenous Q6H PRN Gouru, Aruna, MD      . oxyCODONE (Oxy IR/ROXICODONE) immediate release tablet 5 mg  5 mg Oral Q4H PRN Varughese, Bincy S, NP   5 mg at 01/27/17 2107  . sertraline (ZOLOFT) tablet 25 mg  25 mg Oral Daily Gouru, Aruna, MD   25 mg at 01/28/17 1002  . sodium chloride flush (NS) 0.9 % injection 10-40 mL  10-40 mL Intracatheter Q12H [name], MD   10 mL at 01/26/17 2213  . sodium chloride flush (NS) 0.9 % injection 10-40 mL  10-40 mL Intracatheter PRN [name], MD      . sodium chloride flush 0.9 % injection           . vitamin C (ASCORBIC ACID) tablet 500 mg  500 mg Oral Daily Demetrios Loll, MD         Discharge Medications: Please see discharge summary for a list of discharge medications.  Relevant Imaging Results:  Relevant Lab Results:   Additional Information ss: [id number]  Shela Leff, LCSW

## 2017-01-28 NOTE — Progress Notes (Signed)
PT Cancellation Note  Patient Details Name: Brian [name] MRN: [id number] DOB: 03/12/1960   Cancelled Treatment:    Reason Eval/Treat Not Completed: Patient declined, no reason specified(Treatment session attempted.  Patient sleeping upon arrival; awakens to voice from therapist.  Reports being "out of it" and "groggy" since procedure this morning (s/P TEE) and declines activity despite encouragement.  Will continue efforts next date as appropriate.)  Of note, per telephone clarification with Dr. Candiss Norse, patient okay to mobilize as tolerated; no mobility restrictions related to R temp IJ dialysis catheter.  Planning conversion to perm-cath Monday.   [name], PT, DPT, NCS 01/28/17, 2:55 PM [phone]

## 2017-01-28 NOTE — Progress Notes (Signed)
Notified by Central Telemetry that patient Respiratory rate was low; Patient on CPAP and respiratory ranging between 15-19' Repiratory therapist notified and made writer aware that CPAP alarm set on parameters to alarm when respiratory rate is lower than set parameter; Patient asleep respiratory rate verified via CPAP and manually counted and within normal limits; Patient asymptomatic; Central telemetry made aware. Will continue to monitor

## 2017-01-28 NOTE — Progress Notes (Signed)
ANTICOAGULATION CONSULT NOTE - Follow Up Consult  Pharmacy Consult for Heparin  Indication: chest pain/ACS  Allergies  Allergen Reactions  . Naproxen Rash    Patient Measurements: Height: 5\' 10"  (177.8 cm) Weight: (unable to weigh pt, in chair) IBW/kg (Calculated) : 73 Heparin Dosing Weight: 107.4 kg   Vital Signs: Temp: 97.4 F (36.3 C) (12/06 1958) Temp Source: Oral (12/06 1958) BP: 141/66 (12/06 1958) Pulse Rate: 97 (12/06 1959)  Labs: Recent Labs    01/25/17 0517  01/26/17 0357  01/27/17 0516 01/27/17 1411 01/28/17 0021  HGB 8.8*  --  7.6*  --  8.5*  --   --   HCT 26.4*  --  22.8*  --  25.4*  --   --   PLT 161  --  137*  --  157  --   --   HEPARINUNFRC 0.39   < >  --    < > 0.25* 0.23* 0.33  CREATININE 6.61*  --  4.42*  --  5.53*  --   --    < > = values in this interval not displayed.    Estimated Creatinine Clearance: 21.6 mL/min (A) (by C-G formula based on SCr of 5.53 mg/dL (H)).   Medications:  Scheduled:  . aspirin EC  81 mg Oral Daily  . atorvastatin  80 mg Oral Daily  . epoetin (EPOGEN/PROCRIT) injection  4,000 Units Intravenous Q T,Th,Sa-HD  . gabapentin  100 mg Oral QHS  . heparin  4,000 Units Intracatheter Once  . insulin aspart  0-5 Units Subcutaneous QHS  . insulin aspart  0-9 Units Subcutaneous TID WC  . levothyroxine  50 mcg Oral QAC breakfast  . sertraline  25 mg Oral Daily  . sodium chloride flush  10-40 mL Intracatheter Q12H    Assessment: Pt is a 56 year old obese male who was brought in by EMS with generalized weakness, hypotension. Pt found to have a troponin of >65. Pharmacy consulted to dose heparin drip with no bolus- per Dr. Margaretmary Eddy.      Goal of Therapy:  Heparin level 0.3-0.7 units/ml Monitor platelets by anticoagulation protocol: Yes   Plan:  12/5:  HL @ 20:00 = 0.24 Will order Heparin 1600 units IV X 1 and increase drip rate to 2500 units/hr.  Will recheck HL 8 hrs after rate change.   12/6 @ 0500 HL 0.25  subtherapeutic. Will rebolus w/ heparin 1500 units IV x 1 and increase rate to 2600 units/hr and recheck HL @ 1100  12/6 at 1411 HL 0.23 subtherapeutic. Will increase to heparin 2750 units/hr and recheck in 8h. CBC in AM. Pt with consistently low HL despite increasing drip rate. Will order antithrombin III level (to be followed up in AM) to assess if that is a possible mechanism for heparin resistance.   12/7 @ 0021 HL 0.33 therapeutic. Will continue current rate and will f/u anti-Xa @ 0600.   Tobie Lords, PharmD, BCPS Clinical Pharmacist 01/28/2017

## 2017-01-29 LAB — GLUCOSE, CAPILLARY
GLUCOSE-CAPILLARY: 138 mg/dL — AB (ref 65–99)
GLUCOSE-CAPILLARY: 137 mg/dL — AB (ref 65–99)
GLUCOSE-CAPILLARY: 115 mg/dL — AB (ref 65–99)
GLUCOSE-CAPILLARY: 123 mg/dL — AB (ref 65–99)
Glucose-Capillary: 129 mg/dL — ABNORMAL HIGH (ref 65–99)

## 2017-01-29 LAB — CULTURE, BLOOD (SINGLE)
Culture: NO GROWTH
SPECIAL REQUESTS: ADEQUATE

## 2017-01-29 LAB — CBC
HEMATOCRIT: 26.3 % — AB (ref 40.0–52.0)
HEMOGLOBIN: 8.8 g/dL — AB (ref 13.0–18.0)
MCH: 30 pg (ref 26.0–34.0)
MCHC: 33.5 g/dL (ref 32.0–36.0)
MCV: 89.6 fL (ref 80.0–100.0)
Platelets: 268 10*3/uL (ref 150–440)
RBC: 2.93 MIL/uL — ABNORMAL LOW (ref 4.40–5.90)
RDW: 15.6 % — AB (ref 11.5–14.5)
WBC: 9.7 10*3/uL (ref 3.8–10.6)

## 2017-01-29 LAB — PHOSPHORUS: PHOSPHORUS: 3.5 mg/dL (ref 2.5–4.6)

## 2017-01-29 LAB — POTASSIUM: POTASSIUM: 3.2 mmol/L — AB (ref 3.5–5.1)

## 2017-01-29 LAB — MAGNESIUM: Magnesium: 1.9 mg/dL (ref 1.7–2.4)

## 2017-01-29 LAB — HEPATITIS B SURFACE ANTIBODY, QUANTITATIVE: HEPATITIS B-POST: 4.3 m[IU]/mL — AB

## 2017-01-29 LAB — HEPATITIS B SURFACE ANTIGEN: Hepatitis B Surface Ag: NEGATIVE

## 2017-01-29 LAB — HEPARIN LEVEL (UNFRACTIONATED)

## 2017-01-29 MED ORDER — EPOETIN ALFA 10000 UNIT/ML IJ SOLN
10000.0000 [IU] | INTRAMUSCULAR | Status: DC
  Administered 2017-01-29: 10000 [IU] via INTRAVENOUS

## 2017-01-29 MED ORDER — CEFAZOLIN SODIUM-DEXTROSE 1-4 GM/50ML-% IV SOLN: 1.0000 g | INTRAVENOUS | Status: DC

## 2017-01-29 NOTE — Progress Notes (Signed)
Pt finished dialysis at at 1450. RN removed temporary dialysis catheter per order. Pressure was held for 20 minutes to site with no signs of bleeding and RN informed pt to lay flat for the next 15 minutes before moving. pt verbalized understanding. Will continue to monitor.   Odaliz Mcqueary [employer]

## 2017-01-29 NOTE — Progress Notes (Signed)
[facility]  Daily Progress Note  Will plan on permcath insertion on Monday with Dr. Ellis Parents [facility] PA-C 01/29/2017 1:54 PM

## 2017-01-29 NOTE — Progress Notes (Signed)
Pre HD assessment  

## 2017-01-29 NOTE — Progress Notes (Signed)
[place] at [place] NAME: Brian [name]    MR#:  [id number]  DATE OF BIRTH:  09-10-1960  SUBJECTIVE:  CHIEF COMPLAINT:   Chief Complaint  Patient presents with  . Weakness   Generalized weakness and better diarrhea. on O2 [place] 4 L. REVIEW OF SYSTEMS:  Review of Systems  Constitutional: Positive for malaise/fatigue. Negative for chills and fever.  HENT: Negative for sore throat.   Eyes: Negative for blurred vision and double vision.  Respiratory: Negative for cough, hemoptysis, shortness of breath, wheezing and stridor.   Cardiovascular: Negative for chest pain, palpitations, orthopnea and leg swelling.  Gastrointestinal: Positive for diarrhea. Negative for abdominal pain, blood in stool, melena, nausea and vomiting.  Genitourinary: Negative for dysuria, flank pain and hematuria.  Musculoskeletal: Negative for back pain and joint pain.  Neurological: Positive for weakness. Negative for dizziness, sensory change, focal weakness, seizures, loss of consciousness and headaches.  Endo/Heme/Allergies: Negative for polydipsia.  Psychiatric/Behavioral: Negative for depression. The patient is not nervous/anxious.     DRUG ALLERGIES:   Allergies  Allergen Reactions  . Naproxen Rash   VITALS:  Blood pressure (!) 169/93, pulse 78, temperature 98.8 F (37.1 C), temperature source Other (Comment), resp. rate 16, height 5\' 10"  (1.778 m), weight (!) 327 lb 6.1 oz (148.5 kg), SpO2 99 %. PHYSICAL EXAMINATION:  Physical Exam  Constitutional: He is oriented to person, place, and time and well-developed, well-nourished, and in no distress.  Morbid obese  HENT:  Head: Normocephalic.  Eyes: Conjunctivae and EOM are normal. Pupils are equal, round, and reactive to light. No scleral icterus.  Neck: Normal range of motion. Neck supple. No JVD present. No tracheal deviation present.  Cardiovascular: Normal rate, regular rhythm and normal heart sounds.  Exam reveals no gallop.  No murmur heard. Pulmonary/Chest: Effort normal. No respiratory distress. He has no wheezes. He has no rales.  Diminished lung sounds  Abdominal: Soft. Bowel sounds are normal. He exhibits no distension. There is no tenderness. There is no rebound.  Musculoskeletal: Normal range of motion. He exhibits no edema or tenderness.  Neurological: He is alert and oriented to person, place, and time. No cranial nerve deficit.  Skin: No rash noted. No erythema.  Psychiatric: Affect normal.   LABORATORY PANEL:  Male CBC Recent Labs  Lab 01/29/17 0449  WBC 9.7  HGB 8.8*  HCT 26.3*  PLT 268   ------------------------------------------------------------------------------------------------------------------ Chemistries  Recent Labs  Lab 01/23/17 1144  01/27/17 0516  01/29/17 0449  NA 132*   < > 134*  --   --   K 4.3   < > 3.1*  --  3.2*  CL 100*   < > 100*  --   --   CO2 20*   < > 22  --   --   GLUCOSE 182*   < > 134*  --   --   BUN 52*   < > 45*  --   --   CREATININE 4.92*   < > 5.53*  --   --   CALCIUM 8.5*   < > 8.1*  --   --   MG  --    < > 1.9   < > 1.9  AST 119*  --   --   --   --   ALT 30  --   --   --   --   ALKPHOS 68  --   --   --   --  BILITOT 0.4  --   --   --   --    < > = values in this interval not displayed.   RADIOLOGY:  No results found. ASSESSMENT AND PLAN:   Brian [name]  is a 56 y.o. male with a known history of COPD, chronic diastolic congestive heart failure, end-stage renal disease recently started on hemodialysis was just admitted to hospital on 12/29/2016 and discharged on 01/06/2017 with CHF exacerbation is presenting to the ED with a chief complaint of generalized weakness. By the time he came into the hospital he is hypotensive. Patient's initial troponin is at 65 EKG with no ST elevations.  #Non-STEMI Discontinue heparin drip, continue ASA and atorvastatin, echo is unremarkable. Per Dr. Ubaldo Glassing, consider cath when septic  picture improves to evaluate anatomy. Not candidate for beta blockers and ace I given hemodynamics.   #Septic shock with MSSA bacteremia, due to permacath infection. Off Levophed drip.  Blood cultures show positive MSSA. He was on broad-spectrum IV antibiotic Zosyn and vancomycin.  Per Dr. Ola Spurr, changed to ancef, Min 2 weeks IV abx will be needed.  TEE if TTE neg for vegetations.  Removed permcath.  NO GROWTH 5 DAYS per repeated blood culture. TEE is unremarkable. Permacath on Monday.   ESRD. Temporary dialysis cathter placed and HD today per Dr. Candiss Norse.  Hyponatremia.  Improved.  Diarrhea. Negative C. difficile test. Enteropathogenic E coli (EPEC)  #Chronic diastolic congestive heart failure without fluid overload  #Chronic history of obstructive sleep apnea Patient is noncompliant with CPAP machine Continue oxygen via nasal cannula  Anemia of chronic disease.  Stable.  EPO with HD.  Hypokalemia.  Adjust potassium during dialysis.  Discussed with Dr. Candiss Norse.. All the records are reviewed and case discussed with Care Management/Social Worker. Management plans discussed with the patient, his wife and they are in agreement.  CODE STATUS: Full Code  TOTAL TIME TAKING CARE OF THIS PATIENT: 28 minutes.   More than 50% of the time was spent in counseling/coordination of care: YES  POSSIBLE D/C IN 2-3 DAYS, DEPENDING ON CLINICAL CONDITION.   Demetrios Loll M.D on 01/29/2017 at 12:46 PM  Between 7am to 6pm - Pager - [phone]  After 6pm go to [email or web address] - [place] [facility]

## 2017-01-29 NOTE — Progress Notes (Signed)
HD tx end  

## 2017-01-29 NOTE — Progress Notes (Signed)
Post HD assessment  

## 2017-01-29 NOTE — Progress Notes (Signed)
Physical Therapy Treatment Patient Details Name: Brian [name] MRN: [id number] DOB: 1960-11-12 Today's Date: 01/29/2017    History of Present Illness Pt is a 56 y/o M who was just admitted to hospital on 12/29/2016 and discharged on 01/06/2017 with CHF exacerbation, currently presenting to the ED with a chief complaint of generalized weakness. By the time he came into the hospital he was hypotensive.Patient's initial troponin was 65.  Pt reports nausea, vomiting, and diarrhea for 2 days and missed dialysis the day PTA. Pt admitted with NSTEMI and met sepsis criteria.  R jugular permcath removed and temporary cath placed.  Pt's PMH includes CHF, COPD, morbid obesity, spinal stenosis.     PT Comments    Pt in bed, reports he feels about the same, wants to stand at edge of bed.  Stated he was up to chair several times with nursing last night.  To edge of bed with rail and HOB raised but no assist needed today.  Able to maintain sitting without difficulty.  Pt stood x 2 at edge of bed with overall poor technique pulling up on walker with one hand but he was able to stand without direct assist.  He stood up to 5 minutes x 2 with walker at at times leaning on windowsill at edge of bed.  He was able to step in place without buckling.  Participated in exercises as described below.  Pt remained sitting edge of bed with breakfast tray.  Discussed with nurse tech and was given ok to remain sitting while he ate.     Follow Up Recommendations  SNF     Equipment Recommendations  None recommended by PT    Recommendations for Other Services       Precautions / Restrictions Precautions Precautions: Fall;Other (comment) Precaution Comments: O2, temporary cath, rectal tube Restrictions Weight Bearing Restrictions: No    Mobility  Bed Mobility Overal bed mobility: Modified Independent Bed Mobility: Supine to Sit     Supine to sit: HOB elevated;Modified independent (Device/Increase time)         Transfers Overall transfer level: Needs assistance Equipment used: Rolling walker (2 wheeled) Transfers: Sit to/from Stand Sit to Stand: Min guard         General transfer comment: poor hand placement as he pulls up with one hand on walker but is able to stand on first attempt.    Ambulation/Gait             General Gait Details: declined "I just want to stand for a while"  standing x 2 trials up to 5 minutes each   Stairs            Wheelchair Mobility    Modified Rankin (Stroke Patients Only)       Balance Overall balance assessment: Needs assistance;History of Falls Sitting-balance support: Feet supported;Single extremity supported Sitting balance-Leahy Scale: Fair Sitting balance - Comments: Pt able to sit without UE support   Standing balance support: Bilateral upper extremity supported Standing balance-Leahy Scale: Poor Standing balance comment: Pt relies on UE support for static and dynamic activities                            Cognition Arousal/Alertness: Awake/alert Behavior During Therapy: WFL for tasks assessed/performed Overall Cognitive Status: Within Functional Limits for tasks assessed  Exercises Other Exercises Other Exercises: Seated AROM for LAQ, ab/add x 10.  Verbal review of supine HEP and voiced understanding.    General Comments        Pertinent Vitals/Pain Pain Assessment: No/denies pain    Home Living                      Prior Function            PT Goals (current goals can now be found in the care plan section) Progress towards PT goals: Progressing toward goals    Frequency    Min 2X/week      PT Plan Current plan remains appropriate    Co-evaluation              AM-PAC PT "6 Clicks" Daily Activity  Outcome Measure  Difficulty turning over in bed (including adjusting bedclothes, sheets and blankets)?: A  Little Difficulty moving from lying on back to sitting on the side of the bed? : A Little Difficulty sitting down on and standing up from a chair with arms (e.g., wheelchair, bedside commode, etc,.)?: A Little Help needed moving to and from a bed to chair (including a wheelchair)?: A Little Help needed walking in hospital room?: A Little Help needed climbing 3-5 steps with a railing? : A Lot 6 Click Score: 17    End of Session Equipment Utilized During Treatment: Gait belt;Oxygen Activity Tolerance: Patient tolerated treatment well;Patient limited by fatigue Patient left: in bed;with call bell/phone within reach Nurse Communication: Mobility status       Time: 7169-6789 PT Time Calculation (min) (ACUTE ONLY): 21 min  Charges:  $Therapeutic Activity: 8-22 mins                    G Codes:      Chesley Noon, PTA 01/29/17, 9:26 AM

## 2017-01-29 NOTE — Progress Notes (Signed)
HD tx start 

## 2017-01-29 NOTE — Progress Notes (Signed)
[facility], [place] 01/29/17  Subjective:  Patient is doing fair Denies acute C/O Patient is able to eat or drink without nausea vomiting He has some lower extremity edema   Objective:  Vital signs in last 24 hours:  Temp:  [98.2 F (36.8 C)-98.8 F (37.1 C)] 98.8 F (37.1 C) (12/08 1050) Pulse Rate:  [77-85] 78 (12/08 1230) Resp:  [16-21] 16 (12/08 1230) BP: (137-169)/(59-96) 169/93 (12/08 1230) SpO2:  [94 %-100 %] 99 % (12/08 1230) Weight:  [135.6 kg (299 lb)-148.5 kg (327 lb 6.1 oz)] 148.5 kg (327 lb 6.1 oz) (12/08 1050)  Weight change: -0.045 kg (-1.6 oz) Filed Weights   01/29/17 0500 01/29/17 0633 01/29/17 1050  Weight: 135.6 kg (299 lb) (!) 148.2 kg (326 lb 11.2 oz) (!) 148.5 kg (327 lb 6.1 oz)    Intake/Output:    Intake/Output Summary (Last 24 hours) at 01/29/2017 1243 Last data filed at 01/29/2017 0000 Gross per 24 hour  Intake 537 ml  Output 525 ml  Net 12 ml     Physical Exam: General:  Sitting up in the bed  HEENT Anicteric, moist mucus membranes  Neck Supple,   Pulm/lungs  mild scattered rhonchi, otherwise clear  CVS/Heart irregular  Abdomen:  Soft, NT,   Extremities: + dependent edema  Neurologic: Alert, oteinted     Access: Rt IJ temp cath       Basic Metabolic Panel:  Recent Labs  Lab 01/23/17 1144  01/24/17 0253 01/25/17 0517 01/26/17 0357 01/27/17 0516 01/28/17 0611 01/29/17 0449  NA 132*  --  132* 129* 134* 134*  --   --   K 4.3  --  3.9 3.7 3.1* 3.1*  --  3.2*  CL 100*  --  103 101 101 100*  --   --   CO2 20*  --  18* 17* 22 22  --   --   GLUCOSE 182*  --  153* 159* 144* 134*  --   --   BUN 52*  --  58* 72* 38* 45*  --   --   CREATININE 4.92*  --  5.43* 6.61* 4.42* 5.53*  --   --   CALCIUM 8.5*  --  8.0* 8.0* 7.7* 8.1*  --   --   MG  --    < >  --  2.0 1.8 1.9 1.8 1.9  PHOS  --    < >  --  5.3* 4.4 4.9* 3.6 3.5   < > = values in this interval not displayed.     CBC: Recent Labs  Lab  01/23/17 1144  01/25/17 0517 01/26/17 0357 01/27/17 0516 01/28/17 0611 01/29/17 0449  WBC 9.9   < > 7.2 6.4 8.9 10.1 9.7  NEUTROABS 8.5*  --   --   --   --   --   --   HGB 9.9*   < > 8.8* 7.6* 8.5* 8.6* 8.8*  HCT 30.2*   < > 26.4* 22.8* 25.4* 25.7* 26.3*  MCV 91.5   < > 90.3 89.4 89.7 89.0 89.6  PLT 218   < > 161 137* 157 190 268   < > = values in this interval not displayed.      Lab Results  Component Value Date   HEPBSAG Negative 12/31/2016   HEPBSAB Non Reactive 12/31/2016      Microbiology:  Recent Results (from the past 240 hour(s))  CULTURE, BLOOD (ROUTINE X 2) w Reflex to ID Panel  Status: Abnormal   Collection Time: 01/23/17 11:44 AM  Result Value Ref Range Status   Specimen Description BLOOD RIGHT ANTECUBITAL  Final   Special Requests   Final    BOTTLES DRAWN AEROBIC AND ANAEROBIC Blood Culture results may not be optimal due to an excessive volume of blood received in culture bottles   Culture  Setup Time   Final    GRAM POSITIVE COCCI IN BOTH AEROBIC AND ANAEROBIC BOTTLES CRITICAL RESULT CALLED TO, READ BACK BY AND VERIFIED WITH: [place] MCBANE [place] [address] ON 01/24/17 [place].    Culture STAPHYLOCOCCUS AUREUS (A)  Final   Report Status 01/26/2017 FINAL  Final   Organism ID, Bacteria STAPHYLOCOCCUS AUREUS  Final      Susceptibility   Staphylococcus aureus - MIC*    CIPROFLOXACIN <=0.5 SENSITIVE Sensitive     ERYTHROMYCIN <=0.25 SENSITIVE Sensitive     GENTAMICIN <=0.5 SENSITIVE Sensitive     OXACILLIN <=0.25 SENSITIVE Sensitive     TETRACYCLINE <=1 SENSITIVE Sensitive     VANCOMYCIN <=0.5 SENSITIVE Sensitive     TRIMETH/SULFA <=10 SENSITIVE Sensitive     CLINDAMYCIN <=0.25 SENSITIVE Sensitive     RIFAMPIN <=0.5 SENSITIVE Sensitive     Inducible Clindamycin NEGATIVE Sensitive     * STAPHYLOCOCCUS AUREUS  CULTURE, BLOOD (ROUTINE X 2) w Reflex to ID Panel     Status: Abnormal   Collection Time: 01/23/17 11:44 AM  Result Value Ref Range Status   Specimen  Description BLOOD BLOOD LEFT FOREARM  Final   Special Requests   Final    BOTTLES DRAWN AEROBIC AND ANAEROBIC Blood Culture results may not be optimal due to an excessive volume of blood received in culture bottles   Culture  Setup Time   Final    GRAM POSITIVE COCCI IN BOTH AEROBIC AND ANAEROBIC BOTTLES CRITICAL VALUE NOTED.  VALUE IS CONSISTENT WITH PREVIOUSLY REPORTED AND CALLED VALUE.    Culture (A)  Final    STAPHYLOCOCCUS AUREUS SUSCEPTIBILITIES PERFORMED ON PREVIOUS CULTURE WITHIN THE LAST 5 DAYS. Performed at [facility], [place] [address], [place],  [address]    Report Status 01/26/2017 FINAL  Final  Blood Culture ID Panel (Reflexed)     Status: Abnormal   Collection Time: 01/23/17 11:44 AM  Result Value Ref Range Status   Enterococcus species NOT DETECTED NOT DETECTED Final   Listeria monocytogenes NOT DETECTED NOT DETECTED Final   Staphylococcus species DETECTED (A) NOT DETECTED Final    Comment: CRITICAL RESULT CALLED TO, READ BACK BY AND VERIFIED WITH: MATT MCBANE AT 0700 ON 01/24/17 [place].    Staphylococcus aureus DETECTED (A) NOT DETECTED Final    Comment: Methicillin (oxacillin) susceptible Staphylococcus aureus (MSSA). Preferred therapy is anti staphylococcal beta lactam antibiotic (Cefazolin or Nafcillin), unless clinically contraindicated. CRITICAL RESULT CALLED TO, READ BACK BY AND VERIFIED WITH: MATT MCBANE AT 0700 ON 01/24/17 [place].    Methicillin resistance NOT DETECTED NOT DETECTED Final   Streptococcus species NOT DETECTED NOT DETECTED Final   Streptococcus agalactiae NOT DETECTED NOT DETECTED Final   Streptococcus pneumoniae NOT DETECTED NOT DETECTED Final   Streptococcus pyogenes NOT DETECTED NOT DETECTED Final   Acinetobacter baumannii NOT DETECTED NOT DETECTED Final   Enterobacteriaceae species NOT DETECTED NOT DETECTED Final   Enterobacter cloacae complex NOT DETECTED NOT DETECTED Final   Escherichia coli NOT DETECTED NOT DETECTED Final    Klebsiella oxytoca NOT DETECTED NOT DETECTED Final   Klebsiella pneumoniae NOT DETECTED NOT DETECTED Final   Proteus  species NOT DETECTED NOT DETECTED Final   Serratia marcescens NOT DETECTED NOT DETECTED Final   Haemophilus influenzae NOT DETECTED NOT DETECTED Final   Neisseria meningitidis NOT DETECTED NOT DETECTED Final   Pseudomonas aeruginosa NOT DETECTED NOT DETECTED Final   Candida albicans NOT DETECTED NOT DETECTED Final   Candida glabrata NOT DETECTED NOT DETECTED Final   Candida krusei NOT DETECTED NOT DETECTED Final   Candida parapsilosis NOT DETECTED NOT DETECTED Final   Candida tropicalis NOT DETECTED NOT DETECTED Final  C difficile quick scan w PCR reflex     Status: None   Collection Time: 01/23/17  1:24 PM  Result Value Ref Range Status   C Diff antigen NEGATIVE NEGATIVE Final   C Diff toxin NEGATIVE NEGATIVE Final   C Diff interpretation No C. difficile detected.  Final  MRSA PCR Screening     Status: None   Collection Time: 01/23/17  2:57 PM  Result Value Ref Range Status   MRSA by PCR NEGATIVE NEGATIVE Final    Comment:        The GeneXpert MRSA Assay (FDA approved for NASAL specimens only), is one component of a comprehensive MRSA colonization surveillance program. It is not intended to diagnose MRSA infection nor to guide or monitor treatment for MRSA infections.   Culture, blood (single) w Reflex to ID Panel     Status: None   Collection Time: 01/24/17  4:34 PM  Result Value Ref Range Status   Specimen Description BLOOD BLOOD RIGHT HAND  Final   Special Requests   Final    BOTTLES DRAWN AEROBIC AND ANAEROBIC Blood Culture adequate volume   Culture NO GROWTH 5 DAYS  Final   Report Status 01/29/2017 FINAL  Final  Cath Tip Culture     Status: None (Preliminary result)   Collection Time: 01/24/17  4:42 PM  Result Value Ref Range Status   Specimen Description CATH TIP  Final   Special Requests NONE  Final   Culture   Final    >100,000 CFU STAPHYLOCOCCUS  AUREUS SUSCEPTIBILITIES TO FOLLOW Performed at [facility], [place] [address], [place], [place] [address]    Report Status PENDING  Incomplete  Gastrointestinal Panel by PCR , Stool     Status: Abnormal   Collection Time: 01/24/17  9:23 PM  Result Value Ref Range Status   Campylobacter species NOT DETECTED NOT DETECTED Final   Plesimonas shigelloides NOT DETECTED NOT DETECTED Final   Salmonella species NOT DETECTED NOT DETECTED Final   Yersinia enterocolitica NOT DETECTED NOT DETECTED Final   Vibrio species NOT DETECTED NOT DETECTED Final   Vibrio cholerae NOT DETECTED NOT DETECTED Final   Enteroaggregative E coli (EAEC) NOT DETECTED NOT DETECTED Final   Enteropathogenic E coli (EPEC) DETECTED (A) NOT DETECTED Final    Comment: RESULT CALLED TO, READ BACK BY AND VERIFIED WITH: CHELSEA WRENN AT 0011 01/25/17 [place]    Enterotoxigenic E coli (ETEC) NOT DETECTED NOT DETECTED Final   Shiga like toxin producing E coli (STEC) NOT DETECTED NOT DETECTED Final   Shigella/Enteroinvasive E coli (EIEC) NOT DETECTED NOT DETECTED Final   Cryptosporidium NOT DETECTED NOT DETECTED Final   Cyclospora cayetanensis NOT DETECTED NOT DETECTED Final   Entamoeba histolytica NOT DETECTED NOT DETECTED Final   Giardia lamblia NOT DETECTED NOT DETECTED Final   Adenovirus F40/41 NOT DETECTED NOT DETECTED Final   Astrovirus NOT DETECTED NOT DETECTED Final   Norovirus GI/GII NOT DETECTED NOT DETECTED Final   Rotavirus  A NOT DETECTED NOT DETECTED Final   Sapovirus (I, II, IV, and V) NOT DETECTED NOT DETECTED Final    Coagulation Studies: No results for input(s): LABPROT, INR in the last 72 hours.  Urinalysis: No results for input(s): COLORURINE, LABSPEC, PHURINE, GLUCOSEU, HGBUR, BILIRUBINUR, KETONESUR, PROTEINUR, UROBILINOGEN, NITRITE, LEUKOCYTESUR in the last 72 hours.  Invalid input(s): APPERANCEUR    Imaging: No results found.   Medications:   .  ceFAZolin (ANCEF) IV Stopped (01/27/17 1843)    . aspirin EC  81 mg Oral Daily  . atorvastatin  80 mg Oral Daily  . epoetin (EPOGEN/PROCRIT) injection  4,000 Units Intravenous Q T,Th,Sa-HD  . feeding supplement (ENSURE ENLIVE)  237 mL Oral BID BM  . feeding supplement (NEPRO CARB STEADY)  237 mL Oral BID BM  . gabapentin  100 mg Oral QHS  . heparin  4,000 Units Intracatheter Once  . insulin aspart  0-5 Units Subcutaneous QHS  . insulin aspart  0-9 Units Subcutaneous TID WC  . levothyroxine  50 mcg Oral QAC breakfast  . multivitamin  1 tablet Oral QHS  . sertraline  25 mg Oral Daily  . sodium chloride flush  10-40 mL Intracatheter Q12H  . vitamin C  500 mg Oral Daily   acetaminophen, albuterol, nitroGLYCERIN, ondansetron (ZOFRAN) IV, oxyCODONE, sodium chloride flush  Assessment/ Plan:  56 y.o. caucasian male with end stage renal disease on hemodialysis, diabetes mellitus type II insulin dependent, diabetic peripheral neuropathy, right toe amputation, hypertension, hyperlipidemia, COPD/tobacco use, depression, coronary artery disease  TTS [facility] RIJ permcath  1. ESRD 2, Sepsis, Staph aureus (MSSA). Cath tip culture is also POS for S Aureus 3. Acute coronary syndrome/ NSTEMI/ Followed by Dr Ubaldo Glassing 4. Edema 5. AoCKD 6. SHPTH  Plan: Tunnel dialysis catheter has been removed.  Temporary dialysis catheter in place Antibiotics as per ICU and ID team; currently getting cefazolin Plan for dialysis today and then remove the dialysis catheter. PermCath to be placed on Monday Continue EPO with HD ACS, [facility] cardiology following; TEE done 12/7; results negative Plan for heart catheterization once sepsis is resolved 2 D echo shows EF 50-55%.  Phos 3.5, monitor Low potassium.  Use 4K bath    LOS: 6 Brian [name] 12/8/201812:43 PM  [facility] [place], [place]

## 2017-01-30 LAB — MAGNESIUM: MAGNESIUM: 1.8 mg/dL (ref 1.7–2.4)

## 2017-01-30 LAB — PHOSPHORUS: PHOSPHORUS: 2.4 mg/dL — AB (ref 2.5–4.6)

## 2017-01-30 LAB — GLUCOSE, CAPILLARY
GLUCOSE-CAPILLARY: 133 mg/dL — AB (ref 65–99)
GLUCOSE-CAPILLARY: 161 mg/dL — AB (ref 65–99)
Glucose-Capillary: 164 mg/dL — ABNORMAL HIGH (ref 65–99)
Glucose-Capillary: 136 mg/dL — ABNORMAL HIGH (ref 65–99)

## 2017-01-30 MED ORDER — CARVEDILOL 6.25 MG PO TABS
6.2500 mg | ORAL_TABLET | Freq: Two times a day (BID) | ORAL | Status: DC
  Administered 2017-01-30 – 2017-01-31 (×3): 6.25 mg via ORAL
  Filled 2017-01-30 (×3): qty 1

## 2017-01-30 NOTE — Progress Notes (Signed)
[place] [facility] [place] NAME: Brian [name]    MR#:  [id number]  DATE OF BIRTH:  1960/08/15  SUBJECTIVE:  CHIEF COMPLAINT:   Chief Complaint  Patient presents with  . Weakness   He feels better but still has generalized weakness and better diarrhea. on O2 [place] 3 L. REVIEW OF SYSTEMS:  Review of Systems  Constitutional: Positive for malaise/fatigue. Negative for chills and fever.  HENT: Negative for sore throat.   Eyes: Negative for blurred vision and double vision.  Respiratory: Negative for cough, hemoptysis, shortness of breath, wheezing and stridor.   Cardiovascular: Negative for chest pain, palpitations, orthopnea and leg swelling.  Gastrointestinal: Positive for diarrhea. Negative for abdominal pain, blood in stool, melena, nausea and vomiting.  Genitourinary: Negative for dysuria, flank pain and hematuria.  Musculoskeletal: Negative for back pain and joint pain.  Neurological: Positive for weakness. Negative for dizziness, sensory change, focal weakness, seizures, loss of consciousness and headaches.  Endo/Heme/Allergies: Negative for polydipsia.  Psychiatric/Behavioral: Negative for depression. The patient is not nervous/anxious.     DRUG ALLERGIES:   Allergies  Allergen Reactions  . Naproxen Rash   VITALS:  Blood pressure 119/66, pulse 72, temperature 98.4 F (36.9 C), temperature source Oral, resp. rate (!) 22, height 5\' 10"  (1.778 m), weight (!) 321 lb 8 oz (145.8 kg), SpO2 98 %. PHYSICAL EXAMINATION:  Physical Exam  Constitutional: He is oriented to person, place, and time and well-developed, well-nourished, and in no distress.  Morbid obese  HENT:  Head: Normocephalic.  Eyes: Conjunctivae and EOM are normal. Pupils are equal, round, and reactive to light. No scleral icterus.  Neck: Normal range of motion. Neck supple. No JVD present. No tracheal deviation present.  Cardiovascular: Normal rate, regular rhythm and normal  heart sounds. Exam reveals no gallop.  No murmur heard. Pulmonary/Chest: Effort normal. No respiratory distress. He has no wheezes. He has no rales.  Diminished lung sounds  Abdominal: Soft. Bowel sounds are normal. He exhibits no distension. There is no tenderness. There is no rebound.  Musculoskeletal: Normal range of motion. He exhibits no edema or tenderness.  Neurological: He is alert and oriented to person, place, and time. No cranial nerve deficit.  Skin: No rash noted. No erythema.  Psychiatric: Affect normal.   LABORATORY PANEL:  Male CBC Recent Labs  Lab 01/29/17 0449  WBC 9.7  HGB 8.8*  HCT 26.3*  PLT 268   ------------------------------------------------------------------------------------------------------------------ Chemistries  Recent Labs  Lab 01/27/17 0516  01/29/17 0449 01/30/17 0349  NA 134*  --   --   --   K 3.1*  --  3.2*  --   CL 100*  --   --   --   CO2 22  --   --   --   GLUCOSE 134*  --   --   --   BUN 45*  --   --   --   CREATININE 5.53*  --   --   --   CALCIUM 8.1*  --   --   --   MG 1.9   < > 1.9 1.8   < > = values in this interval not displayed.   RADIOLOGY:  No results found. ASSESSMENT AND PLAN:   Brian [name]  is a 56 y.o. male with a known history of COPD, chronic diastolic congestive heart failure, end-stage renal disease recently started on hemodialysis was just admitted to hospital on 12/29/2016 and discharged on 01/06/2017  with CHF exacerbation is presenting to the ED with a chief complaint of generalized weakness. By the time he came into the hospital he is hypotensive. Patient's initial troponin is at 65 EKG with no ST elevations.  #Non-STEMI Discontinue heparin drip, continue ASA and atorvastatin, echo is unremarkable. Per Dr. Ubaldo Glassing, consider cath when septic picture improves to evaluate anatomy. Not candidate for beta blockers and ace I given hemodynamics.   #Septic shock with MSSA bacteremia, due to permacath  infection. Off Levophed drip.  Blood cultures show positive MSSA. He was on broad-spectrum IV antibiotic Zosyn and vancomycin.  Per Dr. Ola Spurr, changed to ancef, Min 2 weeks IV abx will be needed.  TEE if TTE neg for vegetations.  Removed permcath.  NO GROWTH 5 DAYS per repeated blood culture. TEE is unremarkable. Permacath tomorrow.  Hypertension.  Hypotension improved and blood pressure is elevated.  Resume low-dose Coreg 6.25 mg p.o. twice daily.  ESRD. Temporary dialysis cathter was removed after HD yesterday.  Hyponatremia.  Improved.  Diarrhea. Negative C. difficile test. Enteropathogenic E coli (EPEC)  #Chronic diastolic congestive heart failure without fluid overload  #Chronic history of obstructive sleep apnea Patient is noncompliant with CPAP machine Continue oxygen via nasal cannula  Anemia of chronic disease.  Stable.  EPO with HD.  Hypokalemia.  Adjust potassium during dialysis.  Generalized weakness.  HHPT.  All the records are reviewed and case discussed with Care Management/Social Worker. Management plans discussed with the patient, his wife and they are in agreement.  CODE STATUS: Full Code  TOTAL TIME TAKING CARE OF THIS PATIENT: 26 minutes.   More than 50% of the time was spent in counseling/coordination of care: YES  POSSIBLE D/C IN 1-2 DAYS, DEPENDING ON CLINICAL CONDITION.   Demetrios Loll M.D on 01/30/2017 at 1:37 PM  Between 7am to 6pm - Pager - [phone]  After 6pm go to [email or web address] - [facility]

## 2017-01-31 ENCOUNTER — Encounter: Payer: Self-pay | Admitting: Internal Medicine

## 2017-01-31 ENCOUNTER — Inpatient Hospital Stay
Admission: EM | Disposition: A | Discharge status: Home Health | Payer: Self-pay | Source: Home / Self Care | Attending: Internal Medicine

## 2017-01-31 DIAGNOSIS — N186 End stage renal disease: Secondary | ICD-10-CM

## 2017-01-31 HISTORY — PX: DIALYSIS/PERMA CATHETER INSERTION: CATH118288

## 2017-01-31 LAB — PHOSPHORUS: Phosphorus: 3.6 mg/dL (ref 2.5–4.6)

## 2017-01-31 LAB — BASIC METABOLIC PANEL
ANION GAP: 11 (ref 5–15)
BUN: 23 mg/dL — ABNORMAL HIGH (ref 6–20)
CHLORIDE: 101 mmol/L (ref 101–111)
CO2: 26 mmol/L (ref 22–32)
Calcium: 8.6 mg/dL — ABNORMAL LOW (ref 8.9–10.3)
Creatinine, Ser: 4.34 mg/dL — ABNORMAL HIGH (ref 0.61–1.24)
GFR calc non Af Amer: 14 mL/min — ABNORMAL LOW (ref >60)
GFR, EST AFRICAN AMERICAN: 16 mL/min — AB (ref >60)
Glucose, Bld: 130 mg/dL — ABNORMAL HIGH (ref 65–99)
Potassium: 3.3 mmol/L — ABNORMAL LOW (ref 3.5–5.1)
Sodium: 138 mmol/L (ref 135–145)

## 2017-01-31 LAB — GLUCOSE, CAPILLARY
GLUCOSE-CAPILLARY: 130 mg/dL — AB (ref 65–99)
GLUCOSE-CAPILLARY: 129 mg/dL — AB (ref 65–99)
Glucose-Capillary: 129 mg/dL — ABNORMAL HIGH (ref 65–99)

## 2017-01-31 LAB — MAGNESIUM: Magnesium: 1.9 mg/dL (ref 1.7–2.4)

## 2017-01-31 SURGERY — DIALYSIS/PERMA CATHETER INSERTION
Anesthesia: Moderate Sedation

## 2017-01-31 MED ORDER — HEPARIN (PORCINE) IN NACL 2-0.9 UNIT/ML-% IJ SOLN
INTRAMUSCULAR | Status: AC
  Filled 2017-01-31: qty 500

## 2017-01-31 MED ORDER — SODIUM CHLORIDE 0.9 % IV SOLN: INTRAVENOUS | Status: DC

## 2017-01-31 MED ORDER — GABAPENTIN 100 MG PO CAPS: 100.0000 mg | ORAL_CAPSULE | Freq: Every day | ORAL | Status: DC

## 2017-01-31 MED ORDER — FENTANYL CITRATE (PF) 100 MCG/2ML IJ SOLN
INTRAMUSCULAR | Status: AC
  Filled 2017-01-31: qty 2

## 2017-01-31 MED ORDER — DEXTROSE 5 % IV SOLN: 2.0000 g | INTRAVENOUS | Status: DC

## 2017-01-31 MED ORDER — LIDOCAINE-EPINEPHRINE (PF) 1 %-1:200000 IJ SOLN
INTRAMUSCULAR | Status: AC
  Filled 2017-01-31: qty 30

## 2017-01-31 MED ORDER — CEFAZOLIN SODIUM 1 G IJ SOLR: 2.0000 g | INTRAMUSCULAR | Status: DC

## 2017-01-31 MED ORDER — FENTANYL CITRATE (PF) 100 MCG/2ML IJ SOLN
INTRAMUSCULAR | Status: DC | PRN
  Administered 2017-01-31 (×2): 50 ug via INTRAVENOUS
  Administered 2017-01-31 (×2): 25 ug via INTRAVENOUS

## 2017-01-31 MED ORDER — CEFAZOLIN SODIUM-DEXTROSE 1-4 GM/50ML-% IV SOLN
INTRAVENOUS | Status: AC
  Administered 2017-01-31: 14:00:00
  Filled 2017-01-31: qty 50

## 2017-01-31 MED ORDER — CARVEDILOL 6.25 MG PO TABS: 6.2500 mg | ORAL_TABLET | Freq: Two times a day (BID) | ORAL | 2 refills | Status: DC

## 2017-01-31 MED ORDER — HEPARIN SODIUM (PORCINE) 10000 UNIT/ML IJ SOLN
INTRAMUSCULAR | Status: AC
  Filled 2017-01-31: qty 1

## 2017-01-31 MED ORDER — MIDAZOLAM HCL 2 MG/2ML IJ SOLN
INTRAMUSCULAR | Status: DC | PRN
  Administered 2017-01-31: 2 mg via INTRAVENOUS
  Administered 2017-01-31 (×3): 1 mg via INTRAVENOUS

## 2017-01-31 MED ORDER — MIDAZOLAM HCL 5 MG/5ML IJ SOLN
INTRAMUSCULAR | Status: AC
  Filled 2017-01-31: qty 5

## 2017-01-31 SURGICAL SUPPLY — 8 items
CATH PALINDROME RT-P 15FX23CM (CATHETERS) IMPLANT
CATH PALINDROME RT-P 15FX28CM (CATHETERS) ×3 IMPLANT
COVER PROBE U/S 5X48 (MISCELLANEOUS) ×3 IMPLANT
GUIDEWIRE SUPER STIFF .035X180 (WIRE) ×3 IMPLANT
PACK ANGIOGRAPHY (CUSTOM PROCEDURE TRAY) ×3 IMPLANT
SUT MNCRL AB 4-0 PS2 18 (SUTURE) ×3 IMPLANT
SUTURE VIC 3-0 (SUTURE) ×3 IMPLANT
TOWEL OR 17X26 4PK STRL BLUE (TOWEL DISPOSABLE) ×3 IMPLANT

## 2017-01-31 NOTE — Clinical Social Work Note (Signed)
Patient to discharge to home after permcath today. Patient is requesting to go home via EMS and does meet requirements for transport for EMS. Nursing aware that when ready for discharge to call EMS. EMS form prepared by CSW. Shela Leff MSW,LCSW [phone]

## 2017-01-31 NOTE — Progress Notes (Signed)
Patient doing well since returning from perm cath placement.  Discharge instructions reviewed with him.  Patient given rx.  EMS is here to transport the patient

## 2017-01-31 NOTE — Progress Notes (Signed)
Patient transferred to [facility] for his permcath placement

## 2017-01-31 NOTE — Consult Note (Signed)
Pharmacy Antibiotic Note  Brian [name] is a 56 y.o. male admitted on 01/23/2017 with bacteremia.  Pharmacy has been consulted for cefazolin dosing. Pt on HD  Plan: Continue cefazolin 2g q T, Thus, Sat w/ HD  Height: 5\' 10"  (177.8 cm) Weight: (!) 322 lb 12.1 oz (146.4 kg) IBW/kg (Calculated) : 73  Temp (24hrs), Avg:98.5 F (36.9 C), Min:98.2 F (36.8 C), Max:98.8 F (37.1 C)  Recent Labs  Lab 01/25/17 0517 01/26/17 0357 01/27/17 0516 01/28/17 0611 01/29/17 0449 01/31/17 0330  WBC 7.2 6.4 8.9 10.1 9.7  --   CREATININE 6.61* 4.42* 5.53*  --   --  4.34*    Estimated Creatinine Clearance: 27.2 mL/min (A) (by C-G formula based on SCr of 4.34 mg/dL (H)).    Allergies  Allergen Reactions  . Naproxen Rash    Antimicrobials this admission: vanc 12/2 >> 12/3 zosyn 12/2 >> 12/3 Cefazolin 12/3>>  Dose adjustments this admission:   Microbiology results: 12/2 BCx: MSSA 12/3 cath tip: staph aureus 12/3 [place]: NG  Thank you for allowing pharmacy to be a part of this patient's care.  Ramond Dial, Pharm.D, BCPS Clinical Pharmacist  01/31/2017 9:34 AM

## 2017-01-31 NOTE — Progress Notes (Signed)
Patient in recovery post left perm cath insertion, tolerated well, Dr Lucky Cowboy out to speak with patient with questions answered, alert and oriented, vitals stable. Report called to Rochel Brome Rn on [place] with questions answered.

## 2017-01-31 NOTE — Discharge Instructions (Signed)
Renal and ADA diet HHPT. 2 weeks IV ancef which can be given at HD. Stop date would be 02/07/17

## 2017-01-31 NOTE — Care Management (Signed)
Patient to discharge home today after placement of perm cath.  Home health orders for RN, PT, aide, and SW.  Patient has utilized [facility] home health previously and they can accept the patient at discharge.  [place] with [facility] has been notified of discharge. [name] HD liaison has been notified of discharge, and need for antibiotics at discharge. Patient's outpatient HD days are TTS. CSW has arranged EMS transport home. RNCM signing off.

## 2017-01-31 NOTE — Progress Notes (Signed)
[place], [place] 01/31/17  Subjective:  Patient due for PermCath later today. Thereafter he will likely be discharged home. Next hemodialysis scheduled for tomorrow.   Objective:  Vital signs in last 24 hours:  Temp:  [98.2 F (36.8 C)-98.8 F (37.1 C)] 98.2 F (36.8 C) (12/10 0555) Pulse Rate:  [76-85] 85 (12/10 0853) Resp:  [16-18] 16 (12/10 0555) BP: (120-150)/(50-86) 150/66 (12/10 0853) SpO2:  [95 %-100 %] 95 % (12/10 0555) Weight:  [146.4 kg (322 lb 12.1 oz)] 146.4 kg (322 lb 12.1 oz) (12/10 0344)  Weight change: -2.1 kg (-10.1 oz) Filed Weights   01/29/17 1450 01/30/17 0500 01/31/17 0344  Weight: (!) 146.5 kg (322 lb 15.6 oz) (!) 145.8 kg (321 lb 8 oz) (!) 146.4 kg (322 lb 12.1 oz)    Intake/Output:    Intake/Output Summary (Last 24 hours) at 01/31/2017 1247 Last data filed at 01/30/2017 1251 Gross per 24 hour  Intake -  Output 650 ml  Net -650 ml     Physical Exam: General: No acute distress  HEENT Anicteric, moist mucus membranes  Neck Supple  Pulm/lungs mild scattered rhonchi  CVS/Heart Irregular, no rubs  Abdomen:  Soft, NT, BS present  Extremities: + dependent edema  Neurologic: Alert, oriented x 3     Access: none       Basic Metabolic Panel:  Recent Labs  Lab 01/25/17 0517 01/26/17 0357 01/27/17 0516 01/28/17 0611 01/29/17 0449 01/30/17 0349 01/31/17 0330  NA 129* 134* 134*  --   --   --  138  K 3.7 3.1* 3.1*  --  3.2*  --  3.3*  CL 101 101 100*  --   --   --  101  CO2 17* 22 22  --   --   --  26  GLUCOSE 159* 144* 134*  --   --   --  130*  BUN 72* 38* 45*  --   --   --  23*  CREATININE 6.61* 4.42* 5.53*  --   --   --  4.34*  CALCIUM 8.0* 7.7* 8.1*  --   --   --  8.6*  MG 2.0 1.8 1.9 1.8 1.9 1.8 1.9  PHOS 5.3* 4.4 4.9* 3.6 3.5 2.4* 3.6     CBC: Recent Labs  Lab 01/25/17 0517 01/26/17 0357 01/27/17 0516 01/28/17 0611 01/29/17 0449  WBC 7.2 6.4 8.9 10.1 9.7  HGB 8.8* 7.6* 8.5* 8.6* 8.8*   HCT 26.4* 22.8* 25.4* 25.7* 26.3*  MCV 90.3 89.4 89.7 89.0 89.6  PLT 161 137* 157 190 268      Lab Results  Component Value Date   HEPBSAG Negative 01/28/2017   HEPBSAB Non Reactive 12/31/2016      Microbiology:  Recent Results (from the past 240 hour(s))  CULTURE, BLOOD (ROUTINE X 2) w Reflex to ID Panel     Status: Abnormal   Collection Time: 01/23/17 11:44 AM  Result Value Ref Range Status   Specimen Description BLOOD RIGHT ANTECUBITAL  Final   Special Requests   Final    BOTTLES DRAWN AEROBIC AND ANAEROBIC Blood Culture results may not be optimal due to an excessive volume of blood received in culture bottles   Culture  Setup Time   Final    GRAM POSITIVE COCCI IN BOTH AEROBIC AND ANAEROBIC BOTTLES CRITICAL RESULT CALLED TO, READ BACK BY AND VERIFIED WITH: MATT MCBANE AT [place] ON 01/24/17 [place].    Culture STAPHYLOCOCCUS AUREUS (A)  Final  Report Status 01/26/2017 FINAL  Final   Organism ID, Bacteria STAPHYLOCOCCUS AUREUS  Final      Susceptibility   Staphylococcus aureus - MIC*    CIPROFLOXACIN <=0.5 SENSITIVE Sensitive     ERYTHROMYCIN <=0.25 SENSITIVE Sensitive     GENTAMICIN <=0.5 SENSITIVE Sensitive     OXACILLIN <=0.25 SENSITIVE Sensitive     TETRACYCLINE <=1 SENSITIVE Sensitive     VANCOMYCIN <=0.5 SENSITIVE Sensitive     TRIMETH/SULFA <=10 SENSITIVE Sensitive     CLINDAMYCIN <=0.25 SENSITIVE Sensitive     RIFAMPIN <=0.5 SENSITIVE Sensitive     Inducible Clindamycin NEGATIVE Sensitive     * STAPHYLOCOCCUS AUREUS  CULTURE, BLOOD (ROUTINE X 2) w Reflex to ID Panel     Status: Abnormal   Collection Time: 01/23/17 11:44 AM  Result Value Ref Range Status   Specimen Description BLOOD BLOOD LEFT FOREARM  Final   Special Requests   Final    BOTTLES DRAWN AEROBIC AND ANAEROBIC Blood Culture results may not be optimal due to an excessive volume of blood received in culture bottles   Culture  Setup Time   Final    GRAM POSITIVE COCCI IN BOTH AEROBIC AND ANAEROBIC  BOTTLES CRITICAL VALUE NOTED.  VALUE IS CONSISTENT WITH PREVIOUSLY REPORTED AND CALLED VALUE.    Culture (A)  Final    STAPHYLOCOCCUS AUREUS SUSCEPTIBILITIES PERFORMED ON PREVIOUS CULTURE WITHIN THE LAST 5 DAYS. Performed at [facility], [place] [address] [place], [place] [address]    Report Status 01/26/2017 FINAL  Final  Blood Culture ID Panel (Reflexed)     Status: Abnormal   Collection Time: 01/23/17 11:44 AM  Result Value Ref Range Status   Enterococcus species NOT DETECTED NOT DETECTED Final   Listeria monocytogenes NOT DETECTED NOT DETECTED Final   Staphylococcus species DETECTED (A) NOT DETECTED Final    Comment: CRITICAL RESULT CALLED TO, READ BACK BY AND VERIFIED WITH: MATT MCBANE AT 0700 ON 01/24/17 [place].    Staphylococcus aureus DETECTED (A) NOT DETECTED Final    Comment: Methicillin (oxacillin) susceptible Staphylococcus aureus (MSSA). Preferred therapy is anti staphylococcal beta lactam antibiotic (Cefazolin or Nafcillin), unless clinically contraindicated. CRITICAL RESULT CALLED TO, READ BACK BY AND VERIFIED WITH: MATT MCBANE AT 0700 ON 01/24/17 [place].    Methicillin resistance NOT DETECTED NOT DETECTED Final   Streptococcus species NOT DETECTED NOT DETECTED Final   Streptococcus agalactiae NOT DETECTED NOT DETECTED Final   Streptococcus pneumoniae NOT DETECTED NOT DETECTED Final   Streptococcus pyogenes NOT DETECTED NOT DETECTED Final   Acinetobacter baumannii NOT DETECTED NOT DETECTED Final   Enterobacteriaceae species NOT DETECTED NOT DETECTED Final   Enterobacter cloacae complex NOT DETECTED NOT DETECTED Final   Escherichia coli NOT DETECTED NOT DETECTED Final   Klebsiella oxytoca NOT DETECTED NOT DETECTED Final   Klebsiella pneumoniae NOT DETECTED NOT DETECTED Final   Proteus species NOT DETECTED NOT DETECTED Final   Serratia marcescens NOT DETECTED NOT DETECTED Final   Haemophilus influenzae NOT DETECTED NOT DETECTED Final   Neisseria meningitidis NOT  DETECTED NOT DETECTED Final   Pseudomonas aeruginosa NOT DETECTED NOT DETECTED Final   Candida albicans NOT DETECTED NOT DETECTED Final   Candida glabrata NOT DETECTED NOT DETECTED Final   Candida krusei NOT DETECTED NOT DETECTED Final   Candida parapsilosis NOT DETECTED NOT DETECTED Final   Candida tropicalis NOT DETECTED NOT DETECTED Final  C difficile quick scan w PCR reflex     Status: None   Collection Time: 01/23/17  1:24 PM  Result Value Ref Range Status   C Diff antigen NEGATIVE NEGATIVE Final   C Diff toxin NEGATIVE NEGATIVE Final   C Diff interpretation No C. difficile detected.  Final  MRSA PCR Screening     Status: None   Collection Time: 01/23/17  2:57 PM  Result Value Ref Range Status   MRSA by PCR NEGATIVE NEGATIVE Final    Comment:        The GeneXpert MRSA Assay (FDA approved for NASAL specimens only), is one component of a comprehensive MRSA colonization surveillance program. It is not intended to diagnose MRSA infection nor to guide or monitor treatment for MRSA infections.   Culture, blood (single) w Reflex to ID Panel     Status: None   Collection Time: 01/24/17  4:34 PM  Result Value Ref Range Status   Specimen Description BLOOD BLOOD RIGHT HAND  Final   Special Requests   Final    BOTTLES DRAWN AEROBIC AND ANAEROBIC Blood Culture adequate volume   Culture NO GROWTH 5 DAYS  Final   Report Status 01/29/2017 FINAL  Final  Cath Tip Culture     Status: None (Preliminary result)   Collection Time: 01/24/17  4:42 PM  Result Value Ref Range Status   Specimen Description CATH TIP  Final   Special Requests NONE  Final   Culture   Final    >100,000 CFU STAPHYLOCOCCUS AUREUS SUSCEPTIBILITIES TO FOLLOW Performed at [facility], [place] [address], [place], [place] [address]    Report Status PENDING  Incomplete  Gastrointestinal Panel by PCR , Stool     Status: Abnormal   Collection Time: 01/24/17  9:23 PM  Result Value Ref Range Status   Campylobacter  species NOT DETECTED NOT DETECTED Final   Plesimonas shigelloides NOT DETECTED NOT DETECTED Final   Salmonella species NOT DETECTED NOT DETECTED Final   Yersinia enterocolitica NOT DETECTED NOT DETECTED Final   Vibrio species NOT DETECTED NOT DETECTED Final   Vibrio cholerae NOT DETECTED NOT DETECTED Final   Enteroaggregative E coli (EAEC) NOT DETECTED NOT DETECTED Final   Enteropathogenic E coli (EPEC) DETECTED (A) NOT DETECTED Final    Comment: RESULT CALLED TO, READ BACK BY AND VERIFIED WITH: CHELSEA WRENN AT 0011 01/25/17 [place]    Enterotoxigenic E coli (ETEC) NOT DETECTED NOT DETECTED Final   Shiga like toxin producing E coli (STEC) NOT DETECTED NOT DETECTED Final   Shigella/Enteroinvasive E coli (EIEC) NOT DETECTED NOT DETECTED Final   Cryptosporidium NOT DETECTED NOT DETECTED Final   Cyclospora cayetanensis NOT DETECTED NOT DETECTED Final   Entamoeba histolytica NOT DETECTED NOT DETECTED Final   Giardia lamblia NOT DETECTED NOT DETECTED Final   Adenovirus F40/41 NOT DETECTED NOT DETECTED Final   Astrovirus NOT DETECTED NOT DETECTED Final   Norovirus GI/GII NOT DETECTED NOT DETECTED Final   Rotavirus A NOT DETECTED NOT DETECTED Final   Sapovirus (I, II, IV, and V) NOT DETECTED NOT DETECTED Final    Coagulation Studies: No results for input(s): LABPROT, INR in the last 72 hours.  Urinalysis: No results for input(s): COLORURINE, LABSPEC, PHURINE, GLUCOSEU, HGBUR, BILIRUBINUR, KETONESUR, PROTEINUR, UROBILINOGEN, NITRITE, LEUKOCYTESUR in the last 72 hours.  Invalid input(s): APPERANCEUR    Imaging: No results found.   Medications:   .  ceFAZolin (ANCEF) IV Stopped (01/29/17 2000)  .  ceFAZolin (ANCEF) IV     . aspirin EC  81 mg Oral Daily  . atorvastatin  80 mg Oral Daily  .  carvedilol  6.25 mg Oral BID WC  . [START ON 02/01/2017] epoetin (EPOGEN/PROCRIT) injection  10,000 Units Intravenous Q T,Th,Sa-HD  . feeding supplement (NEPRO CARB STEADY)  237 mL Oral BID BM  .  gabapentin  100 mg Oral QHS  . heparin  4,000 Units Intracatheter Once  . insulin aspart  0-5 Units Subcutaneous QHS  . insulin aspart  0-9 Units Subcutaneous TID WC  . levothyroxine  50 mcg Oral QAC breakfast  . multivitamin  1 tablet Oral QHS  . sertraline  25 mg Oral Daily  . sodium chloride flush  10-40 mL Intracatheter Q12H  . vitamin C  500 mg Oral Daily   acetaminophen, albuterol, nitroGLYCERIN, ondansetron (ZOFRAN) IV, oxyCODONE, sodium chloride flush  Assessment/ Plan:  56 y.o. caucasian male with end stage renal disease on hemodialysis, diabetes mellitus type II insulin dependent, diabetic peripheral neuropathy, right toe amputation, hypertension, hyperlipidemia, COPD/tobacco use, depression, coronary artery disease  TTS [facility] permcath  1. ESRD on HD TTHS. 2. Sepsis, Staph aureus (MSSA). Cath tip culture is also POS for S Aureus 3. Acute coronary syndrome/ NSTEMI/ Followed by Dr Ubaldo Glassing 4. Edema 5. AoCKD 6. SHPTH  Plan: Patient to have replacement PermCath placed today.  He will likely be discharged after PermCath placement.  Patient advised to go to outpatient dialysis tomorrow.  He will need cefazolin 2 g IV with each dialysis treatment for 2 weeks as an outpatient.  In addition patient will need Epogen as an outpatient as well as routine monitoring of bone mineral metabolism parameters.  Thanks for allowing [place] to participate.    LOS: 8 Swetha Rayle 12/10/201812:47 PM  [place], [place]

## 2017-01-31 NOTE — Op Note (Signed)
OPERATIVE NOTE    PRE-OPERATIVE DIAGNOSIS: 1. ESRD 2.  Recent PermCath infection with removal of his right jugular PermCath  POST-OPERATIVE DIAGNOSIS: same as above  PROCEDURE: 1. Ultrasound guidance for vascular access to the left internal jugular vein 2. Fluoroscopic guidance for placement of catheter 3. Placement of a 28 cm tip to cuff tunneled hemodialysis catheter via the left internal jugular vein  SURGEON: Leotis Pain, MD  ANESTHESIA:  Local with Moderate conscious sedation for approximately 25 minutes using 5 mg of Versed and 150 Mcg of Fentanyl  ESTIMATED BLOOD LOSS: 30 cc  FLUORO TIME: less than one minute  CONTRAST: none  FINDING(S): 1.  Patent left internal jugular vein  SPECIMEN(S):  None  INDICATIONS:   Brian [name] is a 56 y.o. male who presents with renal failure and a recent PermCath infection.  Last week, his right jugular PermCath was removed.  His blood cultures have now cleared and he does not have extremity access that is usable at this point.  The patient needs long term dialysis access for their ESRD, and a Permcath is necessary.  Risks and benefits are discussed and informed consent is obtained.    DESCRIPTION: After obtaining full informed written consent, the patient was brought back to the vascular suited. The patient's left neck and chest were sterilely prepped and draped in a sterile surgical field was created. Moderate conscious sedation was administered during a face to face encounter with the patient throughout the procedure with my supervision of the RN administering medicines and monitoring the patient's vital signs, pulse oximetry, telemetry and mental status throughout from the start of the procedure until the patient was taken to the recovery room.  The left internal jugular vein was visualized with ultrasound and found to be patent. It was then accessed under direct ultrasound guidance and a permanent image was recorded. A wire was placed.  After skin nick and dilatation, the peel-away sheath was placed over the wire. I then turned my attention to an area under the clavicle. Approximately 1-2 fingerbreadths below the clavicle a small counterincision was created and tunneled from the subclavicular incision to the access site. Using fluoroscopic guidance, a 23 centimeter tip to cuff tunneled hemodialysis catheter was selected, and tunneled from the subclavicular incision to the access site. It was then placed through the peel-away sheath and the peel-away sheath was removed.  Initially, a 23 cm tip to cuff tunneled hemodialysis catheter was then placed but the catheter tips would not get into the right atrium as the catheter was not quite long enough.  I then placed a stiff wire and remove this catheter and exchanged for a 28 cm tip to cuff tunneled hemodialysis catheter.  Using fluoroscopic guidance the catheter tips were parked in the right atrium. The appropriate distal connectors were placed. It withdrew blood well and flushed easily with heparinized saline and a concentrated heparin solution was then placed. It was secured to the chest wall with 2 Prolene sutures. The access incision was closed single 4-0 Monocryl. A 4-0 Monocryl pursestring suture was placed around the exit site. Sterile dressings were placed. The patient tolerated the procedure well and was taken to the recovery room in stable condition.  COMPLICATIONS: None  CONDITION: Stable  Leotis Pain  01/31/2017, 2:31 PM   This note was created with Dragon Medical transcription system. Any errors in dictation are purely unintentional.

## 2017-01-31 NOTE — Discharge Summary (Signed)
[facility] NAME: Brian [name]    MR#:  [id number]  DATE OF BIRTH:  March 17, 1960  DATE OF ADMISSION:  01/23/2017   ADMITTING PHYSICIAN: Nicholes Mango, MD  DATE OF DISCHARGE: 01/31/2017  5:27 PM  PRIMARY CARE PHYSICIAN: Lorelee Market, MD   ADMISSION DIAGNOSIS:  generalized weakness DISCHARGE DIAGNOSIS:  Active Problems:   NSTEMI (non-ST elevated myocardial infarction) ([place])  SECONDARY DIAGNOSIS:   Past Medical History:  Diagnosis Date  . CHF (congestive heart failure) ([place])   . COPD (chronic obstructive pulmonary disease) ([place])   . Diabetes mellitus type 2 in obese ([place])   . DM (diabetes mellitus) type II controlled with renal manifestation ([place])   . Hypercholesteremia   . Hypertension   . Morbid obesity with BMI of 45.0-49.9, adult ([place])   . Neuropathy   . Pancreatitis, acute   . Pneumonia   . Spinal stenosis    HOSPITAL COURSE:    DennisTurneris a56 y.o.malewith a known history of COPD, chronic diastolic congestive heart failure, end-stage renal disease recently started on hemodialysis was just admitted to hospital on 12/29/2016 and discharged on 01/06/2017 with CHF exacerbation is presenting to the ED with a chief complaint of generalized weakness. By the time he came into the hospital he is hypotensive.Patient's initial troponin is at 65 EKG with no ST elevations.  #Non-STEMI Discontinued heparin drip, continue ASA and atorvastatin, echo is unremarkable. Per Dr. Ubaldo Glassing, consider cath when septic picture improves to evaluate anatomy. Not candidate for beta blockers and ace I given hemodynamics.   #Septic shock with MSSA bacteremia, due to permacath infection. Off Levophed drip.  Blood cultures show positive MSSA. He was on broad-spectrum IV antibiotic Zosyn and vancomycin.  Per Dr. Ola Spurr, changed to ancef, Min 2 weeks IV abx will be needed.  TEE if TTE neg for vegetations.  Removed permcath.  NO  GROWTH 5 DAYS per repeated blood culture. TEE is unremarkable. Permacath placed by Dr. Lucky Cowboy today.  Hypertension.  Hypotension improved and blood pressure is elevated.  Resumed low-dose Coreg 6.25 mg p.o. twice daily.  ESRD. Temporary dialysis cathter was removed after HD yesterday.  Hyponatremia.  Improved.  Diarrhea. Improved. Negative C. difficile test. Enteropathogenic E coli (EPEC)  #Chronic diastolic congestive heart failure without fluid overload  #Chronic history of obstructive sleep apnea Patient is noncompliant with CPAP machine Continue oxygen via nasal cannula  Anemia of chronic disease.  Stable.  EPO with HD.  Hypokalemia.  Adjust potassium during dialysis.  Generalized weakness.  HHPT.  DISCHARGE CONDITIONS:  Stable, discharge to home with HHPT. CONSULTS OBTAINED:  Treatment Team:  [name], MD Lavonia Dana, MD Leonel Ramsay, MD DRUG ALLERGIES:   Allergies  Allergen Reactions  . Naproxen Rash   DISCHARGE MEDICATIONS:   Allergies as of 01/31/2017      Reactions   Naproxen Rash      Medication List    STOP taking these medications   torsemide 100 MG tablet Commonly known as:  DEMADEX     TAKE these medications   albuterol 108 (90 Base) MCG/ACT inhaler Commonly known as:  PROVENTIL HFA;VENTOLIN HFA Inhale 1-2 puffs every 4 (four) hours as needed into the lungs for wheezing or shortness of breath.   aspirin 81 MG EC tablet Take 1 tablet (81 mg total) by mouth daily.   atorvastatin 80 MG tablet Commonly known as:  LIPITOR Take 80 mg daily by mouth.   carvedilol 6.25 MG  tablet Commonly known as:  COREG Take 1 tablet (6.25 mg total) by mouth 2 (two) times daily with a meal. What changed:    medication strength  how much to take   ceFAZolin 2 g in dextrose 5 % 100 mL ivpb Inject 2 g into the vein every Tuesday, Thursday, and Saturday at 6 PM. Start taking on:  02/01/2017   feeding supplement (NEPRO CARB STEADY)  Liqd Take 237 mLs 2 (two) times daily between meals by mouth.   gabapentin 100 MG capsule Commonly known as:  NEURONTIN Take 1 capsule (100 mg total) by mouth at bedtime. What changed:    medication strength  how much to take  when to take this   levothyroxine 50 MCG tablet Commonly known as:  SYNTHROID, LEVOTHROID Take 50 mcg daily before breakfast by mouth.   sertraline 100 MG tablet Commonly known as:  ZOLOFT Take 25 mg by mouth daily.   STIOLTO RESPIMAT 2.5-2.5 MCG/ACT Aers Generic drug:  Tiotropium Bromide-Olodaterol Inhale 2 puffs into the lungs daily.   Vitamin D (Ergocalciferol) 50000 units Caps capsule Commonly known as:  DRISDOL Take 50,000 Units by mouth every 7 (seven) days.        DISCHARGE INSTRUCTIONS:  See AVS  If you experience worsening of your admission symptoms, develop shortness of breath, life threatening emergency, suicidal or homicidal thoughts you must seek medical attention immediately by calling 911 or calling your MD immediately  if symptoms less severe.  You Must read complete instructions/literature along with all the possible adverse reactions/side effects for all the Medicines you take and that have been prescribed to you. Take any new Medicines after you have completely understood and accpet all the possible adverse reactions/side effects.   Please note  You were cared for by a hospitalist during your hospital stay. If you have any questions about your discharge medications or the care you received while you were in the hospital after you are discharged, you can call the unit and asked to speak with the hospitalist on call if the hospitalist that took care of you is not available. Once you are discharged, your primary care physician will handle any further medical issues. Please note that NO REFILLS for any discharge medications will be authorized once you are discharged, as it is imperative that you return to your primary care physician (or  establish a relationship with a primary care physician if you do not have one) for your aftercare needs so that they can reassess your need for medications and monitor your lab values.    On the day of Discharge:  VITAL SIGNS:  Blood pressure (!) 139/59, pulse 71, temperature 98.5 F (36.9 C), temperature source Oral, resp. rate 20, height 5\' 10"  (1.778 m), weight (!) 322 lb (146.1 kg), SpO2 100 %. PHYSICAL EXAMINATION:  GENERAL:  56 y.o.-year-old patient lying in the bed with no acute distress. Obese. EYES: Pupils equal, round, reactive to light and accommodation. No scleral icterus. Extraocular muscles intact.  HEENT: Head atraumatic, normocephalic. Oropharynx and nasopharynx clear.  NECK:  Supple, no jugular venous distention. No thyroid enlargement, no tenderness.  LUNGS: Normal breath sounds bilaterally, no wheezing, rales,rhonchi or crepitation. No use of accessory muscles of respiration.  CARDIOVASCULAR: S1, S2 normal. No murmurs, rubs, or gallops.  ABDOMEN: Soft, non-tender, non-distended. Bowel sounds present. No organomegaly or mass.  EXTREMITIES: bilateral leg edema, no cyanosis, or clubbing.  NEUROLOGIC: Cranial nerves II through XII are intact. Muscle strength 4/5 in all extremities. Sensation intact.  Gait not checked.  PSYCHIATRIC: The patient is alert and oriented x 3.  SKIN: No obvious rash, lesion, or ulcer.  DATA REVIEW:   CBC Recent Labs  Lab 01/29/17 0449  WBC 9.7  HGB 8.8*  HCT 26.3*  PLT 268    Chemistries  Recent Labs  Lab 01/31/17 0330  NA 138  K 3.3*  CL 101  CO2 26  GLUCOSE 130*  BUN 23*  CREATININE 4.34*  CALCIUM 8.6*  MG 1.9     Microbiology Results  Results for orders placed or performed during the hospital encounter of 01/23/17  CULTURE, BLOOD (ROUTINE X 2) w Reflex to ID Panel     Status: Abnormal   Collection Time: 01/23/17 11:44 AM  Result Value Ref Range Status   Specimen Description BLOOD RIGHT ANTECUBITAL  Final   Special  Requests   Final    BOTTLES DRAWN AEROBIC AND ANAEROBIC Blood Culture results may not be optimal due to an excessive volume of blood received in culture bottles   Culture  Setup Time   Final    GRAM POSITIVE COCCI IN BOTH AEROBIC AND ANAEROBIC BOTTLES CRITICAL RESULT CALLED TO, READ BACK BY AND VERIFIED WITH: MATT MCBANE AT [employer] ON 01/24/17 [place].    Culture STAPHYLOCOCCUS AUREUS (A)  Final   Report Status 01/26/2017 FINAL  Final   Organism ID, Bacteria STAPHYLOCOCCUS AUREUS  Final      Susceptibility   Staphylococcus aureus - MIC*    CIPROFLOXACIN <=0.5 SENSITIVE Sensitive     ERYTHROMYCIN <=0.25 SENSITIVE Sensitive     GENTAMICIN <=0.5 SENSITIVE Sensitive     OXACILLIN <=0.25 SENSITIVE Sensitive     TETRACYCLINE <=1 SENSITIVE Sensitive     VANCOMYCIN <=0.5 SENSITIVE Sensitive     TRIMETH/SULFA <=10 SENSITIVE Sensitive     CLINDAMYCIN <=0.25 SENSITIVE Sensitive     RIFAMPIN <=0.5 SENSITIVE Sensitive     Inducible Clindamycin NEGATIVE Sensitive     * STAPHYLOCOCCUS AUREUS  CULTURE, BLOOD (ROUTINE X 2) w Reflex to ID Panel     Status: Abnormal   Collection Time: 01/23/17 11:44 AM  Result Value Ref Range Status   Specimen Description BLOOD BLOOD LEFT FOREARM  Final   Special Requests   Final    BOTTLES DRAWN AEROBIC AND ANAEROBIC Blood Culture results may not be optimal due to an excessive volume of blood received in culture bottles   Culture  Setup Time   Final    GRAM POSITIVE COCCI IN BOTH AEROBIC AND ANAEROBIC BOTTLES CRITICAL VALUE NOTED.  VALUE IS CONSISTENT WITH PREVIOUSLY REPORTED AND CALLED VALUE.    Culture (A)  Final    STAPHYLOCOCCUS AUREUS SUSCEPTIBILITIES PERFORMED ON PREVIOUS CULTURE WITHIN THE LAST 5 DAYS. Performed at [facility], [place] [address], [place], [place] [address]    Report Status 01/26/2017 FINAL  Final  Blood Culture ID Panel (Reflexed)     Status: Abnormal   Collection Time: 01/23/17 11:44 AM  Result Value Ref Range Status   Enterococcus  species NOT DETECTED NOT DETECTED Final   Listeria monocytogenes NOT DETECTED NOT DETECTED Final   Staphylococcus species DETECTED (A) NOT DETECTED Final    Comment: CRITICAL RESULT CALLED TO, READ BACK BY AND VERIFIED WITH: MATT MCBANE AT 0700 ON 01/24/17 [place].    Staphylococcus aureus DETECTED (A) NOT DETECTED Final    Comment: Methicillin (oxacillin) susceptible Staphylococcus aureus (MSSA). Preferred therapy is anti staphylococcal beta lactam antibiotic (Cefazolin or Nafcillin), unless clinically contraindicated. CRITICAL RESULT CALLED TO,  READ BACK BY AND VERIFIED WITH: MATT MCBANE AT 0700 ON 01/24/17 [place].    Methicillin resistance NOT DETECTED NOT DETECTED Final   Streptococcus species NOT DETECTED NOT DETECTED Final   Streptococcus agalactiae NOT DETECTED NOT DETECTED Final   Streptococcus pneumoniae NOT DETECTED NOT DETECTED Final   Streptococcus pyogenes NOT DETECTED NOT DETECTED Final   Acinetobacter baumannii NOT DETECTED NOT DETECTED Final   Enterobacteriaceae species NOT DETECTED NOT DETECTED Final   Enterobacter cloacae complex NOT DETECTED NOT DETECTED Final   Escherichia coli NOT DETECTED NOT DETECTED Final   Klebsiella oxytoca NOT DETECTED NOT DETECTED Final   Klebsiella pneumoniae NOT DETECTED NOT DETECTED Final   Proteus species NOT DETECTED NOT DETECTED Final   Serratia marcescens NOT DETECTED NOT DETECTED Final   Haemophilus influenzae NOT DETECTED NOT DETECTED Final   Neisseria meningitidis NOT DETECTED NOT DETECTED Final   Pseudomonas aeruginosa NOT DETECTED NOT DETECTED Final   Candida albicans NOT DETECTED NOT DETECTED Final   Candida glabrata NOT DETECTED NOT DETECTED Final   Candida krusei NOT DETECTED NOT DETECTED Final   Candida parapsilosis NOT DETECTED NOT DETECTED Final   Candida tropicalis NOT DETECTED NOT DETECTED Final  C difficile quick scan w PCR reflex     Status: None   Collection Time: 01/23/17  1:24 PM  Result Value Ref Range Status   C Diff  antigen NEGATIVE NEGATIVE Final   C Diff toxin NEGATIVE NEGATIVE Final   C Diff interpretation No C. difficile detected.  Final  MRSA PCR Screening     Status: None   Collection Time: 01/23/17  2:57 PM  Result Value Ref Range Status   MRSA by PCR NEGATIVE NEGATIVE Final    Comment:        The GeneXpert MRSA Assay (FDA approved for NASAL specimens only), is one component of a comprehensive MRSA colonization surveillance program. It is not intended to diagnose MRSA infection nor to guide or monitor treatment for MRSA infections.   Culture, blood (single) w Reflex to ID Panel     Status: None   Collection Time: 01/24/17  4:34 PM  Result Value Ref Range Status   Specimen Description BLOOD BLOOD RIGHT HAND  Final   Special Requests   Final    BOTTLES DRAWN AEROBIC AND ANAEROBIC Blood Culture adequate volume   Culture NO GROWTH 5 DAYS  Final   Report Status 01/29/2017 FINAL  Final  Cath Tip Culture     Status: None (Preliminary result)   Collection Time: 01/24/17  4:42 PM  Result Value Ref Range Status   Specimen Description CATH TIP  Final   Special Requests NONE  Final   Culture   Final    >100,000 CFU STAPHYLOCOCCUS AUREUS SUSCEPTIBILITIES TO FOLLOW Performed at [facility], [place] [address] [place], [place] [address]    Report Status PENDING  Incomplete  Gastrointestinal Panel by PCR , Stool     Status: Abnormal   Collection Time: 01/24/17  9:23 PM  Result Value Ref Range Status   Campylobacter species NOT DETECTED NOT DETECTED Final   Plesimonas shigelloides NOT DETECTED NOT DETECTED Final   Salmonella species NOT DETECTED NOT DETECTED Final   Yersinia enterocolitica NOT DETECTED NOT DETECTED Final   Vibrio species NOT DETECTED NOT DETECTED Final   Vibrio cholerae NOT DETECTED NOT DETECTED Final   Enteroaggregative E coli (EAEC) NOT DETECTED NOT DETECTED Final   Enteropathogenic E coli (EPEC) DETECTED (A) NOT DETECTED Final  Comment: RESULT CALLED TO, READ BACK  BY AND VERIFIED WITH: CHELSEA WRENN AT 0011 01/25/17 [place]    Enterotoxigenic E coli (ETEC) NOT DETECTED NOT DETECTED Final   Shiga like toxin producing E coli (STEC) NOT DETECTED NOT DETECTED Final   Shigella/Enteroinvasive E coli (EIEC) NOT DETECTED NOT DETECTED Final   Cryptosporidium NOT DETECTED NOT DETECTED Final   Cyclospora cayetanensis NOT DETECTED NOT DETECTED Final   Entamoeba histolytica NOT DETECTED NOT DETECTED Final   Giardia lamblia NOT DETECTED NOT DETECTED Final   Adenovirus F40/41 NOT DETECTED NOT DETECTED Final   Astrovirus NOT DETECTED NOT DETECTED Final   Norovirus GI/GII NOT DETECTED NOT DETECTED Final   Rotavirus A NOT DETECTED NOT DETECTED Final   Sapovirus (I, II, IV, and V) NOT DETECTED NOT DETECTED Final    RADIOLOGY:  No results found.   Management plans discussed with the patient, family and they are in agreement.  CODE STATUS: Full Code   TOTAL TIME TAKING CARE OF THIS PATIENT: 35 minutes.    Demetrios Loll M.D on 01/31/2017 at 7:21 PM  Between 7am to 6pm - Pager - [phone]  After 6pm go to [email or web address] - Proofreader  [facility]  Hospital[facility]  Office  [phone]  CC: Primary care physician; Lorelee Market, MD   Note: This dictation was prepared with Dragon dictation along with smaller phrase technology. Any transcriptional errors that result from this process are unintentional.

## 2017-01-31 NOTE — H&P (Signed)
[facility] History & Physical Update  The patient was interviewed and re-examined.  The patient's previous History and Physical has been reviewed and is unchanged.  There is no change in the plan of care. We plan to proceed with the scheduled procedure.  Leotis Pain, MD  01/31/2017, 1:22 PM

## 2017-02-01 ENCOUNTER — Encounter: Payer: Self-pay | Admitting: Vascular Surgery

## 2017-02-01 LAB — BLOOD GAS, VENOUS
Acid-base deficit: 3.1 mmol/L — ABNORMAL HIGH (ref 0.0–2.0)
Bicarbonate: 23.2 mmol/L (ref 20.0–28.0)
PATIENT TEMPERATURE: 37
PH VEN: 7.31 (ref 7.250–7.430)
pCO2, Ven: 46 mmHg (ref 44.0–60.0)

## 2017-02-08 ENCOUNTER — Emergency Department: Payer: Medicaid Other

## 2017-02-08 ENCOUNTER — Inpatient Hospital Stay
Admission: EM | Admit: 2017-02-08 | Discharge: 2017-02-11 | DRG: 291 | Disposition: A | Discharge status: Home / Self Care | Payer: Medicaid Other | Attending: Internal Medicine | Admitting: Internal Medicine

## 2017-02-08 DIAGNOSIS — R0602 Shortness of breath: Secondary | ICD-10-CM

## 2017-02-08 DIAGNOSIS — F329 Major depressive disorder, single episode, unspecified: Secondary | ICD-10-CM | POA: Diagnosis present

## 2017-02-08 DIAGNOSIS — Z6841 Body Mass Index (BMI) 40.0 and over, adult: Secondary | ICD-10-CM

## 2017-02-08 DIAGNOSIS — N2581 Secondary hyperparathyroidism of renal origin: Secondary | ICD-10-CM | POA: Diagnosis present

## 2017-02-08 DIAGNOSIS — J449 Chronic obstructive pulmonary disease, unspecified: Secondary | ICD-10-CM | POA: Diagnosis present

## 2017-02-08 DIAGNOSIS — Z9989 Dependence on other enabling machines and devices: Secondary | ICD-10-CM

## 2017-02-08 DIAGNOSIS — E785 Hyperlipidemia, unspecified: Secondary | ICD-10-CM | POA: Diagnosis present

## 2017-02-08 DIAGNOSIS — K920 Hematemesis: Secondary | ICD-10-CM | POA: Diagnosis present

## 2017-02-08 DIAGNOSIS — J90 Pleural effusion, not elsewhere classified: Secondary | ICD-10-CM

## 2017-02-08 DIAGNOSIS — F17210 Nicotine dependence, cigarettes, uncomplicated: Secondary | ICD-10-CM | POA: Diagnosis present

## 2017-02-08 DIAGNOSIS — D631 Anemia in chronic kidney disease: Secondary | ICD-10-CM | POA: Diagnosis present

## 2017-02-08 DIAGNOSIS — I132 Hypertensive heart and chronic kidney disease with heart failure and with stage 5 chronic kidney disease, or end stage renal disease: Principal | ICD-10-CM | POA: Diagnosis present

## 2017-02-08 DIAGNOSIS — Z992 Dependence on renal dialysis: Secondary | ICD-10-CM

## 2017-02-08 DIAGNOSIS — R7881 Bacteremia: Secondary | ICD-10-CM | POA: Diagnosis present

## 2017-02-08 DIAGNOSIS — E1122 Type 2 diabetes mellitus with diabetic chronic kidney disease: Secondary | ICD-10-CM | POA: Diagnosis present

## 2017-02-08 DIAGNOSIS — Z8249 Family history of ischemic heart disease and other diseases of the circulatory system: Secondary | ICD-10-CM

## 2017-02-08 DIAGNOSIS — N186 End stage renal disease: Secondary | ICD-10-CM

## 2017-02-08 DIAGNOSIS — I953 Hypotension of hemodialysis: Secondary | ICD-10-CM | POA: Diagnosis not present

## 2017-02-08 DIAGNOSIS — I5033 Acute on chronic diastolic (congestive) heart failure: Secondary | ICD-10-CM | POA: Diagnosis present

## 2017-02-08 DIAGNOSIS — I10 Essential (primary) hypertension: Secondary | ICD-10-CM | POA: Diagnosis present

## 2017-02-08 DIAGNOSIS — E119 Type 2 diabetes mellitus without complications: Secondary | ICD-10-CM

## 2017-02-08 DIAGNOSIS — N185 Chronic kidney disease, stage 5: Secondary | ICD-10-CM

## 2017-02-08 DIAGNOSIS — G4733 Obstructive sleep apnea (adult) (pediatric): Secondary | ICD-10-CM | POA: Diagnosis present

## 2017-02-08 DIAGNOSIS — J9621 Acute and chronic respiratory failure with hypoxia: Secondary | ICD-10-CM | POA: Diagnosis present

## 2017-02-08 DIAGNOSIS — Z7982 Long term (current) use of aspirin: Secondary | ICD-10-CM

## 2017-02-08 DIAGNOSIS — E1142 Type 2 diabetes mellitus with diabetic polyneuropathy: Secondary | ICD-10-CM | POA: Diagnosis present

## 2017-02-08 DIAGNOSIS — J810 Acute pulmonary edema: Secondary | ICD-10-CM

## 2017-02-08 DIAGNOSIS — Z79890 Hormone replacement therapy (postmenopausal): Secondary | ICD-10-CM

## 2017-02-08 DIAGNOSIS — E8770 Fluid overload, unspecified: Secondary | ICD-10-CM | POA: Diagnosis present

## 2017-02-08 DIAGNOSIS — Z886 Allergy status to analgesic agent status: Secondary | ICD-10-CM

## 2017-02-08 DIAGNOSIS — B9561 Methicillin susceptible Staphylococcus aureus infection as the cause of diseases classified elsewhere: Secondary | ICD-10-CM | POA: Diagnosis present

## 2017-02-08 DIAGNOSIS — J811 Chronic pulmonary edema: Secondary | ICD-10-CM

## 2017-02-08 DIAGNOSIS — E039 Hypothyroidism, unspecified: Secondary | ICD-10-CM | POA: Diagnosis present

## 2017-02-08 DIAGNOSIS — Z79899 Other long term (current) drug therapy: Secondary | ICD-10-CM

## 2017-02-08 DIAGNOSIS — I252 Old myocardial infarction: Secondary | ICD-10-CM

## 2017-02-08 LAB — CBC
HEMATOCRIT: 29.2 % — AB (ref 40.0–52.0)
Hemoglobin: 9.3 g/dL — ABNORMAL LOW (ref 13.0–18.0)
MCH: 29.2 pg (ref 26.0–34.0)
MCHC: 31.9 g/dL — AB (ref 32.0–36.0)
MCV: 91.3 fL (ref 80.0–100.0)
Platelets: 280 10*3/uL (ref 150–440)
RBC: 3.19 MIL/uL — ABNORMAL LOW (ref 4.40–5.90)
RDW: 15.9 % — AB (ref 11.5–14.5)
WBC: 13.6 10*3/uL — ABNORMAL HIGH (ref 3.8–10.6)

## 2017-02-08 NOTE — ED Triage Notes (Signed)
Pt brought in for SOB by [facility]. Pt had a 4 hour dialysis tx today with no fluid taken off. Pt wears 3L [place] normally. Pt was 83% on RA per EMS. Pt wears CPAP at nights. Pt stating that he became SOB shortly after dialysis and that it did not get better. Pt brought in on NRB.

## 2017-02-08 NOTE — ED Provider Notes (Signed)
[facility] Emergency Department Provider Note   ____________________________________________   First MD Initiated Contact with Patient 02/08/17 2310     (approximate)  I have reviewed the triage vital signs and the nursing notes.   HISTORY  Chief Complaint Shortness of Breath    HPI Brian [name] is a 56 y.o. male who comes into the hospital today with some shortness of breath.  The patient states that it started this afternoon.  He had dialysis today and reports that he had a little bit of shortness of breath but is gotten worse over the last few hours.  He reports that they did not cough any fluid at dialysis because he was below dry weight.  The patient states that he has had a cough productive of some yellow phlegm but no fevers.  He denies any chest pain nausea vomiting dizziness or headache.  The patient did have some diarrhea last week but he states that that is resolved.  According to EMS the patient's O2 sats were 83% on room air.  He normally wears 3 L and he is currently satting appropriately on 3 L.  The patient states that he does feel short of breath at this time.  He is here for evaluation.   Past Medical History:  Diagnosis Date  . CHF (congestive heart failure) ([place])   . COPD (chronic obstructive pulmonary disease) ([place])   . Diabetes mellitus type 2 in obese ([place])   . DM (diabetes mellitus) type II controlled with renal manifestation ([place])   . Hypercholesteremia   . Hypertension   . Morbid obesity with BMI of 45.0-49.9, adult ([place])   . Neuropathy   . Pancreatitis, acute   . Pneumonia   . Spinal stenosis     Patient Active Problem List   Diagnosis Date Noted  . NSTEMI (non-ST elevated myocardial infarction) ([place]) 01/23/2017  . Acute on chronic diastolic CHF (congestive heart failure) ([place]) 12/29/2016  . Lymphedema of both lower extremities 10/14/2016  . Obstructive sleep apnea 09/13/2016  . Chronic diastolic heart failure ([place])  03/28/2016  . Tobacco use 03/28/2016  . Hyperkalemia 03/28/2016  . COPD (chronic obstructive pulmonary disease) ([place]) 02/08/2016  . DM (diabetes mellitus) type II controlled with renal manifestation ([place]) 01/05/2015  . Neuropathy ([place])   . Clinical depression 01/17/2013  . HLD (hyperlipidemia) 01/17/2013  . HTN (hypertension) 01/17/2013  . Gonalgia 01/17/2013  . Morbid obesity ([place]) 01/17/2013  . Amputated toe ([place]) 01/17/2013  . Diabetes mellitus ([place]) 01/17/2013    Past Surgical History:  Procedure Laterality Date  . AMPUTATION    . CHOLECYSTECTOMY  1998  . DIALYSIS/PERMA CATHETER INSERTION N/A 01/03/2017   Procedure: DIALYSIS/PERMA CATHETER INSERTION;  Surgeon: Algernon Huxley, MD;  Location: [place] [facility];  Service: Cardiovascular;  Laterality: N/A;  . DIALYSIS/PERMA CATHETER INSERTION N/A 01/31/2017   Procedure: DIALYSIS/PERMA CATHETER INSERTION;  Surgeon: Algernon Huxley, MD;  Location: [place] CV LAB;  Service: Cardiovascular;  Laterality: N/A;  . DIALYSIS/PERMA CATHETER REMOVAL N/A 01/24/2017   Procedure: DIALYSIS/PERMA CATHETER REMOVAL;  Surgeon: Algernon Huxley, MD;  Location: [place] [facility];  Service: Cardiovascular;  Laterality: N/A;  . LAPAROSCOPIC APPENDECTOMY N/A 01/06/2015   Procedure: APPENDECTOMY LAPAROSCOPIC drainage of peritoneal abcess;  Surgeon: Sherri Rad, MD;  Location: [facility] ORS;  Service: General;  Laterality: N/A;  . TEE WITHOUT CARDIOVERSION N/A 01/28/2017   Procedure: TRANSESOPHAGEAL ECHOCARDIOGRAM (TEE);  Surgeon: Corey Skains, MD;  Location: [facility] ORS;  Service: Cardiovascular;  Laterality: N/A;    Prior to Admission medications   Medication Sig Start Date End Date Taking? Authorizing Provider  albuterol (PROVENTIL HFA;VENTOLIN HFA) 108 (90 Base) MCG/ACT inhaler Inhale 1-2 puffs every 4 (four) hours as needed into the lungs for wheezing or shortness of breath.    [provider]  aspirin EC 81 MG EC tablet Take 1 tablet (81 mg total)  by mouth daily. 02/29/16   [name], MD  atorvastatin (LIPITOR) 80 MG tablet Take 80 mg daily by mouth.    [provider]  carvedilol (COREG) 6.25 MG tablet Take 1 tablet (6.25 mg total) by mouth 2 (two) times daily with a meal. 01/31/17   Demetrios Loll, MD  ceFAZolin 2 g in dextrose 5 % 100 mL ivpb Inject 2 g into the vein every Tuesday, Thursday, and Saturday at 6 PM. 02/01/17   Demetrios Loll, MD  gabapentin (NEURONTIN) 100 MG capsule Take 1 capsule (100 mg total) by mouth at bedtime. 01/31/17   Demetrios Loll, MD  levothyroxine (SYNTHROID, LEVOTHROID) 50 MCG tablet Take 50 mcg daily before breakfast by mouth.    [provider]  sertraline (ZOLOFT) 100 MG tablet Take 25 mg by mouth daily.  04/10/13   [provider]  Tiotropium Bromide-Olodaterol (STIOLTO RESPIMAT) 2.5-2.5 MCG/ACT AERS Inhale 2 puffs into the lungs daily.     [provider]  Vitamin D, Ergocalciferol, (DRISDOL) 50000 units CAPS capsule Take 50,000 Units by mouth every 7 (seven) days.    [provider]    Allergies Naproxen  Family History  Problem Relation Age of Onset  . Hypertension Mother   . Hyperlipidemia Mother   . Heart disease Father   . Heart disease Maternal Grandfather     Social History Social History   Tobacco Use  . Smoking status: Current Every Day Smoker    Packs/day: 1.00    Types: Cigarettes  . Smokeless tobacco: Never Used  Substance Use Topics  . Alcohol use: No  . Drug use: No    Review of Systems  Constitutional: No fever/chills Eyes: No visual changes. ENT: No sore throat. Cardiovascular: Denies chest pain. Respiratory: Cough and shortness of breath. Gastrointestinal: No abdominal pain.  No nausea, no vomiting.  No diarrhea.  No constipation. Genitourinary: Negative for dysuria. Musculoskeletal: Negative for back pain. Skin: Negative for rash. Neurological: Negative for headaches, focal weakness or  numbness.   ____________________________________________   PHYSICAL EXAM:  VITAL SIGNS: ED Triage Vitals  Enc Vitals Group     BP 02/08/17 2313 (!) 161/79     Pulse Rate 02/08/17 2313 90     Resp 02/08/17 2313 (!) 22     Temp 02/08/17 2313 98.4 F (36.9 C)     Temp Source 02/08/17 2313 Oral     SpO2 02/08/17 2306 (!) 83 %     Weight 02/08/17 2310 (!) 312 lb (141.5 kg)     Height 02/08/17 2310 5\' 10"  (1.778 m)     Head Circumference --      Peak Flow --      Pain Score 02/08/17 2335 0     Pain Loc --      Pain Edu? --      Excl. in [place]? --     Constitutional: Alert and oriented. Well appearing and in no acute distress. Eyes: Conjunctivae are normal. PERRL. EOMI. Head: Atraumatic. Nose: No congestion/rhinnorhea. Mouth/Throat: Mucous membranes are moist.  Oropharynx non-erythematous. Cardiovascular: Normal rate, regular rhythm. Grossly normal heart  sounds.  Good peripheral circulation. Respiratory: Tachypnea with No retractions. Lungs CTAB. Gastrointestinal: Soft and nontender. No distention.  Positive bowel sounds musculoskeletal: Lateral lower extremity pitting edema.   Neurologic:  Normal speech and language.  Skin:  Skin is warm, dry and intact.  Psychiatric: Mood and affect are normal.   ____________________________________________   LABS (all labs ordered are listed, but only abnormal results are displayed)  Labs Reviewed  CBC - Abnormal; Notable for the following components:      Result Value   WBC 13.6 (*)    RBC 3.19 (*)    Hemoglobin 9.3 (*)    HCT 29.2 (*)    MCHC 31.9 (*)    RDW 15.9 (*)    All other components within normal limits  BASIC METABOLIC PANEL - Abnormal; Notable for the following components:   Glucose, Bld 158 (*)    BUN 39 (*)    Creatinine, Ser 4.30 (*)    Calcium 8.4 (*)    GFR calc non Af Amer 14 (*)    GFR calc Af Amer 16 (*)    All other components within normal limits  TROPONIN I - Abnormal; Notable for the following  components:   Troponin I 0.04 (*)    All other components within normal limits  BRAIN NATRIURETIC PEPTIDE - Abnormal; Notable for the following components:   B Natriuretic Peptide 1,783.0 (*)    All other components within normal limits   ____________________________________________  EKG  ED ECG REPORT I, Loney Hering, the attending physician, personally viewed and interpreted this ECG.   Date: 02/08/2017  EKG Time: 2309  Rate: 91  Rhythm: normal sinus rhythm  Axis: normal  Intervals:none  ST&T Change: flipped T waves in leads II, III and aVF as well as in leads V5 and V6.  ____________________________________________  RADIOLOGY  Dg Chest Portable 1 View  Result Date: 02/08/2017 CLINICAL DATA:  Shortness of breath. Symptom onset after dialysis today. EXAM: PORTABLE CHEST 1 VIEW COMPARISON:  01/25/2017 FINDINGS: Left internal jugular dialysis catheter tip in the distal SVC. Prior central line tip in removed. The heart is enlarged. Increased pulmonary edema from prior exam. Possible small left pleural effusion versus soft tissue attenuation from habitus. No pneumothorax. IMPRESSION: Cardiomegaly with increased pulmonary edema from prior exam. Possible left pleural effusion. Electronically Signed   By: Jeb Levering M.D.   On: 02/08/2017 23:37    ____________________________________________   PROCEDURES  Procedure(s) performed: None  Procedures  Critical Care performed: No  ____________________________________________   INITIAL IMPRESSION / ASSESSMENT AND PLAN / ED COURSE  As part of my medical decision making, I reviewed the following data within the electronic MEDICAL RECORD NUMBER Notes from prior ED visits and [place] Controlled Substance Database   This is a 56 year old male who comes into the hospital today with some shortness of breath.  He is a end-stage renal disease patient on dialysis.  They did not take any fluid off when he was at dialysis  today.  Differential diagnosis includes pneumonia, pulmonary edema, pleural effusion, acute coronary syndrome  I will check some blood work to evaluate for possible acute coronary syndrome and pneumonia.  I will do a chest x-ray looking for pulmonary edema.  The patient's oxygen saturations are appropriate at this time and he is mildly tachypneic.  He will be reassessed.     The patient's blood work returned.  His BNP is 1783 and his creatinine is 4.3.  The patient's chest x-ray shows  cardiomegaly with increased pulmonary edema from prior and a possible left pleural effusion.  The patient states that he still makes urine so I will give him a dose of Lasix.  The patient needs some fluid removed from dialysis.  He will be admitted to the hospital so he can receive some dialysis.  I discussed this with the patient and he agrees. ____________________________________________   FINAL CLINICAL IMPRESSION(S) / ED DIAGNOSES  Final diagnoses:  Shortness of breath  Acute pulmonary edema ([place])  Pleural effusion     ED Discharge Orders    None       Note:  This document was prepared using Dragon voice recognition software and may include unintentional dictation errors.    Loney Hering, MD 02/09/17 [id number]

## 2017-02-08 NOTE — ED Triage Notes (Signed)
Pt has BLE +3 edema

## 2017-02-09 ENCOUNTER — Other Ambulatory Visit: Payer: Self-pay

## 2017-02-09 DIAGNOSIS — I132 Hypertensive heart and chronic kidney disease with heart failure and with stage 5 chronic kidney disease, or end stage renal disease: Secondary | ICD-10-CM | POA: Diagnosis present

## 2017-02-09 DIAGNOSIS — Z992 Dependence on renal dialysis: Secondary | ICD-10-CM | POA: Diagnosis not present

## 2017-02-09 DIAGNOSIS — N2581 Secondary hyperparathyroidism of renal origin: Secondary | ICD-10-CM | POA: Diagnosis present

## 2017-02-09 DIAGNOSIS — E1122 Type 2 diabetes mellitus with diabetic chronic kidney disease: Secondary | ICD-10-CM | POA: Diagnosis present

## 2017-02-09 DIAGNOSIS — J9601 Acute respiratory failure with hypoxia: Secondary | ICD-10-CM

## 2017-02-09 DIAGNOSIS — N186 End stage renal disease: Secondary | ICD-10-CM

## 2017-02-09 DIAGNOSIS — R0602 Shortness of breath: Secondary | ICD-10-CM | POA: Diagnosis present

## 2017-02-09 DIAGNOSIS — Z79899 Other long term (current) drug therapy: Secondary | ICD-10-CM | POA: Diagnosis not present

## 2017-02-09 DIAGNOSIS — Z886 Allergy status to analgesic agent status: Secondary | ICD-10-CM | POA: Diagnosis not present

## 2017-02-09 DIAGNOSIS — E8770 Fluid overload, unspecified: Secondary | ICD-10-CM | POA: Diagnosis present

## 2017-02-09 DIAGNOSIS — B9561 Methicillin susceptible Staphylococcus aureus infection as the cause of diseases classified elsewhere: Secondary | ICD-10-CM | POA: Diagnosis present

## 2017-02-09 DIAGNOSIS — E785 Hyperlipidemia, unspecified: Secondary | ICD-10-CM | POA: Diagnosis present

## 2017-02-09 DIAGNOSIS — E8779 Other fluid overload: Secondary | ICD-10-CM | POA: Diagnosis not present

## 2017-02-09 DIAGNOSIS — R7881 Bacteremia: Secondary | ICD-10-CM | POA: Diagnosis present

## 2017-02-09 DIAGNOSIS — Z8249 Family history of ischemic heart disease and other diseases of the circulatory system: Secondary | ICD-10-CM | POA: Diagnosis not present

## 2017-02-09 DIAGNOSIS — I5033 Acute on chronic diastolic (congestive) heart failure: Secondary | ICD-10-CM

## 2017-02-09 DIAGNOSIS — J449 Chronic obstructive pulmonary disease, unspecified: Secondary | ICD-10-CM

## 2017-02-09 DIAGNOSIS — F17210 Nicotine dependence, cigarettes, uncomplicated: Secondary | ICD-10-CM | POA: Diagnosis present

## 2017-02-09 DIAGNOSIS — E039 Hypothyroidism, unspecified: Secondary | ICD-10-CM | POA: Diagnosis present

## 2017-02-09 DIAGNOSIS — J9621 Acute and chronic respiratory failure with hypoxia: Secondary | ICD-10-CM | POA: Diagnosis present

## 2017-02-09 DIAGNOSIS — G4733 Obstructive sleep apnea (adult) (pediatric): Secondary | ICD-10-CM | POA: Diagnosis present

## 2017-02-09 DIAGNOSIS — I252 Old myocardial infarction: Secondary | ICD-10-CM | POA: Diagnosis not present

## 2017-02-09 DIAGNOSIS — Z7982 Long term (current) use of aspirin: Secondary | ICD-10-CM | POA: Diagnosis not present

## 2017-02-09 DIAGNOSIS — F329 Major depressive disorder, single episode, unspecified: Secondary | ICD-10-CM | POA: Diagnosis present

## 2017-02-09 DIAGNOSIS — Z6841 Body Mass Index (BMI) 40.0 and over, adult: Secondary | ICD-10-CM | POA: Diagnosis not present

## 2017-02-09 DIAGNOSIS — K920 Hematemesis: Secondary | ICD-10-CM | POA: Diagnosis present

## 2017-02-09 LAB — BRAIN NATRIURETIC PEPTIDE: B NATRIURETIC PEPTIDE 5: 1783 pg/mL — AB (ref 0.0–100.0)

## 2017-02-09 LAB — BASIC METABOLIC PANEL
ANION GAP: 12 (ref 5–15)
Anion gap: 9 (ref 5–15)
BUN: 39 mg/dL — ABNORMAL HIGH (ref 6–20)
BUN: 41 mg/dL — AB (ref 6–20)
CALCIUM: 8.4 mg/dL — AB (ref 8.9–10.3)
CHLORIDE: 105 mmol/L (ref 101–111)
CO2: 28 mmol/L (ref 22–32)
CO2: 26 mmol/L (ref 22–32)
CREATININE: 4.5 mg/dL — AB (ref 0.61–1.24)
Calcium: 8.4 mg/dL — ABNORMAL LOW (ref 8.9–10.3)
Chloride: 101 mmol/L (ref 101–111)
Creatinine, Ser: 4.3 mg/dL — ABNORMAL HIGH (ref 0.61–1.24)
GFR, EST AFRICAN AMERICAN: 16 mL/min — AB (ref >60)
GFR, EST AFRICAN AMERICAN: 15 mL/min — AB (ref >60)
GFR, EST NON AFRICAN AMERICAN: 14 mL/min — AB (ref >60)
GFR, EST NON AFRICAN AMERICAN: 13 mL/min — AB (ref >60)
Glucose, Bld: 158 mg/dL — ABNORMAL HIGH (ref 65–99)
Glucose, Bld: 172 mg/dL — ABNORMAL HIGH (ref 65–99)
POTASSIUM: 3.9 mmol/L (ref 3.5–5.1)
Potassium: 4.4 mmol/L (ref 3.5–5.1)
SODIUM: 140 mmol/L (ref 135–145)
Sodium: 141 mmol/L (ref 135–145)

## 2017-02-09 LAB — CBC
HCT: 27.8 % — ABNORMAL LOW (ref 40.0–52.0)
Hemoglobin: 8.7 g/dL — ABNORMAL LOW (ref 13.0–18.0)
MCH: 29 pg (ref 26.0–34.0)
MCHC: 31.3 g/dL — ABNORMAL LOW (ref 32.0–36.0)
MCV: 92.7 fL (ref 80.0–100.0)
PLATELETS: 266 10*3/uL (ref 150–440)
RBC: 3 MIL/uL — AB (ref 4.40–5.90)
RDW: 16.6 % — AB (ref 11.5–14.5)
WBC: 15.7 10*3/uL — AB (ref 3.8–10.6)

## 2017-02-09 LAB — GLUCOSE, CAPILLARY
GLUCOSE-CAPILLARY: 164 mg/dL — AB (ref 65–99)
GLUCOSE-CAPILLARY: 151 mg/dL — AB (ref 65–99)
Glucose-Capillary: 97 mg/dL (ref 65–99)

## 2017-02-09 LAB — MRSA PCR SCREENING: MRSA BY PCR: NEGATIVE

## 2017-02-09 LAB — TROPONIN I: TROPONIN I: 0.04 ng/mL — AB (ref <0.03)

## 2017-02-09 MED ORDER — ASPIRIN EC 81 MG PO TBEC
81.0000 mg | DELAYED_RELEASE_TABLET | Freq: Every day | ORAL | Status: DC
  Administered 2017-02-10 – 2017-02-11 (×2): 81 mg via ORAL
  Filled 2017-02-09 (×2): qty 1

## 2017-02-09 MED ORDER — ORAL CARE MOUTH RINSE
15.0000 mL | Freq: Two times a day (BID) | OROMUCOSAL | Status: DC
  Administered 2017-02-10: 15 mL via OROMUCOSAL

## 2017-02-09 MED ORDER — FUROSEMIDE 10 MG/ML IJ SOLN
40.0000 mg | Freq: Once | INTRAMUSCULAR | Status: AC
  Administered 2017-02-09: 40 mg via INTRAVENOUS
  Filled 2017-02-09: qty 4

## 2017-02-09 MED ORDER — SERTRALINE HCL 50 MG PO TABS
25.0000 mg | ORAL_TABLET | Freq: Every day | ORAL | Status: DC
  Administered 2017-02-10 – 2017-02-11 (×2): 25 mg via ORAL
  Filled 2017-02-09 (×2): qty 1

## 2017-02-09 MED ORDER — ATORVASTATIN CALCIUM 20 MG PO TABS
80.0000 mg | ORAL_TABLET | Freq: Every day | ORAL | Status: DC
  Administered 2017-02-10: 80 mg via ORAL
  Filled 2017-02-09: qty 4

## 2017-02-09 MED ORDER — ALTEPLASE 2 MG IJ SOLR
2.0000 mg | Freq: Once | INTRAMUSCULAR | Status: AC
  Administered 2017-02-09: 2 mg
  Filled 2017-02-09: qty 2

## 2017-02-09 MED ORDER — GABAPENTIN 100 MG PO CAPS
100.0000 mg | ORAL_CAPSULE | Freq: Every day | ORAL | Status: DC
  Administered 2017-02-09: 100 mg via ORAL
  Filled 2017-02-09: qty 1

## 2017-02-09 MED ORDER — LEVOTHYROXINE SODIUM 50 MCG PO TABS
50.0000 ug | ORAL_TABLET | Freq: Every day | ORAL | Status: DC
  Administered 2017-02-10 – 2017-02-11 (×2): 50 ug via ORAL
  Filled 2017-02-09 (×2): qty 1

## 2017-02-09 MED ORDER — OXYCODONE HCL 5 MG PO TABS
5.0000 mg | ORAL_TABLET | Freq: Four times a day (QID) | ORAL | Status: DC | PRN
  Administered 2017-02-10 (×2): 5 mg via ORAL
  Filled 2017-02-09 (×3): qty 1

## 2017-02-09 MED ORDER — FUROSEMIDE 10 MG/ML IJ SOLN
40.0000 mg | Freq: Once | INTRAMUSCULAR | Status: AC
  Administered 2017-02-09: 40 mg via INTRAVENOUS

## 2017-02-09 MED ORDER — ACETAMINOPHEN 650 MG RE SUPP
650.0000 mg | Freq: Four times a day (QID) | RECTAL | Status: DC | PRN
Start: 2017-02-09 — End: 2017-02-11

## 2017-02-09 MED ORDER — ONDANSETRON HCL 4 MG/2ML IJ SOLN: 4.0000 mg | Freq: Four times a day (QID) | INTRAMUSCULAR | Status: DC | PRN

## 2017-02-09 MED ORDER — EPOETIN ALFA 10000 UNIT/ML IJ SOLN
10000.0000 [IU] | INTRAMUSCULAR | Status: DC
  Administered 2017-02-10: 10000 [IU] via INTRAVENOUS
  Filled 2017-02-09: qty 1

## 2017-02-09 MED ORDER — CARVEDILOL 6.25 MG PO TABS: 6.2500 mg | ORAL_TABLET | Freq: Two times a day (BID) | ORAL | Status: DC

## 2017-02-09 MED ORDER — INSULIN ASPART 100 UNIT/ML ~~LOC~~ SOLN: 0.0000 [IU] | Freq: Every day | SUBCUTANEOUS | Status: DC

## 2017-02-09 MED ORDER — DEXTROSE 5 % IV SOLN
2.0000 g | INTRAVENOUS | Status: DC
  Filled 2017-02-09: qty 20

## 2017-02-09 MED ORDER — INSULIN ASPART 100 UNIT/ML ~~LOC~~ SOLN
0.0000 [IU] | Freq: Three times a day (TID) | SUBCUTANEOUS | Status: DC
  Administered 2017-02-09: 2 [IU] via SUBCUTANEOUS

## 2017-02-09 MED ORDER — HEPARIN SODIUM (PORCINE) 5000 UNIT/ML IJ SOLN
5000.0000 [IU] | Freq: Three times a day (TID) | INTRAMUSCULAR | Status: DC
  Administered 2017-02-09 – 2017-02-11 (×6): 5000 [IU] via SUBCUTANEOUS
  Filled 2017-02-09 (×6): qty 1

## 2017-02-09 MED ORDER — ONDANSETRON HCL 4 MG PO TABS: 4.0000 mg | ORAL_TABLET | Freq: Four times a day (QID) | ORAL | Status: DC | PRN

## 2017-02-09 MED ORDER — ACETAMINOPHEN 325 MG PO TABS
650.0000 mg | ORAL_TABLET | Freq: Four times a day (QID) | ORAL | Status: DC | PRN
  Administered 2017-02-09 – 2017-02-10 (×2): 650 mg via ORAL
  Filled 2017-02-09: qty 2

## 2017-02-09 MED ORDER — FUROSEMIDE 10 MG/ML IJ SOLN
INTRAMUSCULAR | Status: AC
  Administered 2017-02-09: 40 mg via INTRAVENOUS
  Filled 2017-02-09: qty 4

## 2017-02-09 MED ORDER — ALBUMIN HUMAN 25 % IV SOLN
25.0000 g | Freq: Once | INTRAVENOUS | Status: AC
  Administered 2017-02-09: 25 g via INTRAVENOUS
  Filled 2017-02-09: qty 100

## 2017-02-09 MED ORDER — OXYCODONE HCL 5 MG PO TABS
5.0000 mg | ORAL_TABLET | Freq: Four times a day (QID) | ORAL | Status: DC | PRN
  Administered 2017-02-09: 5 mg via ORAL
  Filled 2017-02-09: qty 1

## 2017-02-09 NOTE — Progress Notes (Signed)
MD notified of pt desat while on cpap. Orders for BIPAP. AC notified of bipap orders. Pt must be able to maintain sats on O2 without bipap during the day in order to stay on medsurg unit. bipap okay for now. Respiratory notified. Will continue to monitor.

## 2017-02-09 NOTE — Care Management (Signed)
Patient admitted for fluid overload.  Chronic HD patient.  [name] HD liaison notified of admission. Patient lives at home with wife.  3 liters chronic o2 through [place].  Patient has RW, shower chair and cane in home.  Previous admission patient declined SNF, and opted to return home with home health services through [facility].  Message left for Brittney at [facility] to confirm if patient was open.  RNCM following

## 2017-02-09 NOTE — Progress Notes (Signed)
Post HD assessment  

## 2017-02-09 NOTE — ED Notes (Signed)
Pt's O2 saturations decrease with increased movement.

## 2017-02-09 NOTE — Progress Notes (Signed)
Report called to [facility] in ICU as patient is to transfer after dialysis.

## 2017-02-09 NOTE — H&P (Signed)
[place] [facility] [place] NAME: Brian [name]    MR#:  [id number]  DATE OF BIRTH:  1960-04-09  DATE OF ADMISSION:  02/08/2017  PRIMARY CARE PHYSICIAN: Lorelee Market, MD   REQUESTING/REFERRING PHYSICIAN: Dahlia Client, MD  CHIEF COMPLAINT:   Chief Complaint  Patient presents with  . Shortness of Breath    HISTORY OF PRESENT ILLNESS:  Brian [name]  is a 56 y.o. male who presents with shortness of breath and general edema.  Patient states that he went to dialysis today, but he states that they did not pull any fluid because they told him he was under his dry weight.  He came to the ED because he is been having persistent and increasing shortness of breath.  Workup here in the ED is consistent with fluid overload, including pulmonary edema and generalized peripheral edema.  Hospitalist were called for admission  PAST MEDICAL HISTORY:   Past Medical History:  Diagnosis Date  . CHF (congestive heart failure) ([place])   . COPD (chronic obstructive pulmonary disease) ([place])   . Diabetes mellitus type 2 in obese ([place])   . DM (diabetes mellitus) type II controlled with renal manifestation ([place])   . Hypercholesteremia   . Hypertension   . Morbid obesity with BMI of 45.0-49.9, adult ([place])   . Neuropathy   . Pancreatitis, acute   . Pneumonia   . Spinal stenosis     PAST SURGICAL HISTORY:   Past Surgical History:  Procedure Laterality Date  . AMPUTATION    . CHOLECYSTECTOMY  1998  . DIALYSIS/PERMA CATHETER INSERTION N/A 01/03/2017   Procedure: DIALYSIS/PERMA CATHETER INSERTION;  Surgeon: Algernon Huxley, MD;  Location: [place] CV LAB;  Service: Cardiovascular;  Laterality: N/A;  . DIALYSIS/PERMA CATHETER INSERTION N/A 01/31/2017   Procedure: DIALYSIS/PERMA CATHETER INSERTION;  Surgeon: Algernon Huxley, MD;  Location: [place] [facility];  Service: Cardiovascular;  Laterality: N/A;  . DIALYSIS/PERMA CATHETER REMOVAL N/A 01/24/2017    Procedure: DIALYSIS/PERMA CATHETER REMOVAL;  Surgeon: Algernon Huxley, MD;  Location: [place] [facility] LAB;  Service: Cardiovascular;  Laterality: N/A;  . LAPAROSCOPIC APPENDECTOMY N/A 01/06/2015   Procedure: APPENDECTOMY LAPAROSCOPIC drainage of peritoneal abcess;  Surgeon: Sherri Rad, MD;  Location: [facility] ORS;  Service: General;  Laterality: N/A;  . TEE WITHOUT CARDIOVERSION N/A 01/28/2017   Procedure: TRANSESOPHAGEAL ECHOCARDIOGRAM (TEE);  Surgeon: Corey Skains, MD;  Location: [facility] ORS;  Service: Cardiovascular;  Laterality: N/A;    SOCIAL HISTORY:   Social History   Tobacco Use  . Smoking status: Current Every Day Smoker    Packs/day: 1.00    Types: Cigarettes  . Smokeless tobacco: Never Used  Substance Use Topics  . Alcohol use: No    FAMILY HISTORY:   Family History  Problem Relation Age of Onset  . Hypertension Mother   . Hyperlipidemia Mother   . Heart disease Father   . Heart disease Maternal Grandfather     DRUG ALLERGIES:   Allergies  Allergen Reactions  . Naproxen Rash    MEDICATIONS AT HOME:   Prior to Admission medications   Medication Sig Start Date End Date Taking? Authorizing Provider  albuterol (PROVENTIL HFA;VENTOLIN HFA) 108 (90 Base) MCG/ACT inhaler Inhale 1-2 puffs every 4 (four) hours as needed into the lungs for wheezing or shortness of breath.    [provider]  aspirin EC 81 MG EC tablet Take 1 tablet (81 mg total) by mouth daily. 02/29/16   Posey Pronto,  Sona, MD  atorvastatin (LIPITOR) 80 MG tablet Take 80 mg daily by mouth.    [provider]  carvedilol (COREG) 6.25 MG tablet Take 1 tablet (6.25 mg total) by mouth 2 (two) times daily with a meal. 01/31/17   Demetrios Loll, MD  ceFAZolin 2 g in dextrose 5 % 100 mL ivpb Inject 2 g into the vein every Tuesday, Thursday, and Saturday at 6 PM. 02/01/17   Demetrios Loll, MD  gabapentin (NEURONTIN) 100 MG capsule Take 1 capsule (100 mg total) by mouth at bedtime. 01/31/17   Demetrios Loll, MD   levothyroxine (SYNTHROID, LEVOTHROID) 50 MCG tablet Take 50 mcg daily before breakfast by mouth.    [provider]  sertraline (ZOLOFT) 100 MG tablet Take 25 mg by mouth daily.  04/10/13   [provider]  Tiotropium Bromide-Olodaterol (STIOLTO RESPIMAT) 2.5-2.5 MCG/ACT AERS Inhale 2 puffs into the lungs daily.     [provider]  Vitamin D, Ergocalciferol, (DRISDOL) 50000 units CAPS capsule Take 50,000 Units by mouth every 7 (seven) days.    [provider]    REVIEW OF SYSTEMS:  Review of Systems  Constitutional: Negative for chills, fever, malaise/fatigue and weight loss.  HENT: Negative for ear pain, hearing loss and tinnitus.   Eyes: Negative for blurred vision, double vision, pain and redness.  Respiratory: Positive for shortness of breath. Negative for cough and hemoptysis.   Cardiovascular: Positive for leg swelling. Negative for chest pain, palpitations and orthopnea.  Gastrointestinal: Negative for abdominal pain, constipation, diarrhea, nausea and vomiting.  Genitourinary: Negative for dysuria, frequency and hematuria.  Musculoskeletal: Negative for back pain, joint pain and neck pain.  Skin:       No acne, rash, or lesions  Neurological: Negative for dizziness, tremors, focal weakness and weakness.  Endo/Heme/Allergies: Negative for polydipsia. Does not bruise/bleed easily.  Psychiatric/Behavioral: Negative for depression. The patient is not nervous/anxious and does not have insomnia.      VITAL SIGNS:   Vitals:   02/08/17 2310 02/08/17 2313 02/08/17 2330 02/09/17 0000  BP:  (!) 161/79 140/77 139/74  Pulse:  90 83 78  Resp:  (!) 22 14 15   Temp:  98.4 F (36.9 C)    TempSrc:  Oral    SpO2:  94% 95% 94%  Weight: (!) 141.5 kg (312 lb)     Height: 5\' 10"  (1.778 m)      Wt Readings from Last 3 Encounters:  02/08/17 (!) 141.5 kg (312 lb)  01/31/17 (!) 146.1 kg (322 lb)  01/10/17 (!) 156.2 kg (344 lb 4 oz)    PHYSICAL EXAMINATION:   Physical Exam  Vitals reviewed. Constitutional: He is oriented to person, place, and time. He appears well-developed and well-nourished. No distress.  HENT:  Head: Normocephalic and atraumatic.  Mouth/Throat: Oropharynx is clear and moist.  Eyes: Conjunctivae and EOM are normal. Pupils are equal, round, and reactive to light. No scleral icterus.  Neck: Normal range of motion. Neck supple. No JVD present. No thyromegaly present.  Cardiovascular: Normal rate, regular rhythm and intact distal pulses. Exam reveals no gallop and no friction rub.  Murmur heard. Respiratory: Effort normal. No respiratory distress. He has no wheezes. He has rales.  GI: Soft. Bowel sounds are normal. He exhibits no distension. There is no tenderness.  Musculoskeletal: Normal range of motion. He exhibits edema.  No arthritis, no gout  Lymphadenopathy:    He has no cervical adenopathy.  Neurological: He is alert and oriented to person, place,  and time. No cranial nerve deficit.  No dysarthria, no aphasia  Skin: Skin is warm and dry. No rash noted. No erythema.  Psychiatric: He has a normal mood and affect. His behavior is normal. Judgment and thought content normal.    LABORATORY PANEL:   CBC Recent Labs  Lab 02/08/17 2314  WBC 13.6*  HGB 9.3*  HCT 29.2*  PLT 280   ------------------------------------------------------------------------------------------------------------------  Chemistries  Recent Labs  Lab 02/08/17 2314  NA 141  K 3.9  CL 101  CO2 28  GLUCOSE 158*  BUN 39*  CREATININE 4.30*  CALCIUM 8.4*   ------------------------------------------------------------------------------------------------------------------  Cardiac Enzymes Recent Labs  Lab 02/08/17 2314  TROPONINI 0.04*   ------------------------------------------------------------------------------------------------------------------  RADIOLOGY:  Dg Chest Portable 1 View  Result Date: 02/08/2017 CLINICAL DATA:   Shortness of breath. Symptom onset after dialysis today. EXAM: PORTABLE CHEST 1 VIEW COMPARISON:  01/25/2017 FINDINGS: Left internal jugular dialysis catheter tip in the distal SVC. Prior central line tip in removed. The heart is enlarged. Increased pulmonary edema from prior exam. Possible small left pleural effusion versus soft tissue attenuation from habitus. No pneumothorax. IMPRESSION: Cardiomegaly with increased pulmonary edema from prior exam. Possible left pleural effusion. Electronically Signed   By: Jeb Levering M.D.   On: 02/08/2017 23:37    EKG:   Orders placed or performed during the hospital encounter of 02/08/17  . EKG 12-Lead  . EKG 12-Lead  . ED EKG  . ED EKG  . EKG 12-Lead  . EKG 12-Lead  . EKG 12-Lead  . EKG 12-Lead    IMPRESSION AND PLAN:  Principal Problem:   Fluid overload -IV Lasix in the ED here tonight as the patient does still make some urine, but he will likely need dialysis to truly correct this problem Active Problems:   Acute on chronic diastolic CHF (congestive heart failure) ([place]) -due to his fluid overload, IV Lasix tonight as above, he will use CPAP as he does every night for his sleep apnea which may help some as well, the dialysis is most likely to correct this issue   ESRD on dialysis [facility]) -nephrology consult for dialysis   HTN (hypertension) -control blood pressure with home meds   Diabetes mellitus ([place]) -sliding scale insulin with corresponding glucose checks   COPD (chronic obstructive pulmonary disease) ([place]) -home dose inhalers   Obstructive sleep apnea -CPAP nightly  All the records are reviewed and case discussed with ED provider. Management plans discussed with the patient and/or family.  DVT PROPHYLAXIS: SubQ heparin  GI PROPHYLAXIS: None  ADMISSION STATUS: Observation  CODE STATUS: Full Code Status History    Date Active Date Inactive Code Status Order ID Comments User Context   01/23/2017 14:32 01/31/2017 20:32 Full Code  [id number]  Nicholes Mango, MD Inpatient   12/30/2016 00:42 01/06/2017 21:30 Full Code [id number]  Lance Coon, MD Inpatient   02/27/2016 00:23 02/28/2016 16:06 Full Code [id number]  Harvie Bridge, DO Inpatient   02/08/2016 00:28 02/09/2016 18:20 Full Code [id number]  Laverle Hobby, MD ED   01/06/2015 00:09 01/08/2015 21:38 Full Code [id number]  Hubbard Robinson, MD ED      TOTAL TIME TAKING CARE OF THIS PATIENT: 40 minutes.   Adia Crammer [name] 02/09/2017, 12:33 AM  [facility]  Office  [phone]  CC: Primary care physician; Lorelee Market, MD  Note:  This document was prepared using Dragon voice recognition software and may include unintentional dictation errors.

## 2017-02-09 NOTE — Progress Notes (Signed)
[name] [place] NAME: Brian [name]    MR#:  [id number]  DATE OF BIRTH:  08-Dec-1960  SUBJECTIVE:   Patient here with fluid overload. He is currently wearing BiPAP. He opens his eyes and responds however overall appears lethargic he reports that he did not sleep last night.  REVIEW OF SYSTEMS:    Review of Systems  Constitutional: Negative for fever, chills weight loss HENT: Negative for ear pain, nosebleeds, congestion, facial swelling, rhinorrhea, neck pain, neck stiffness and ear discharge.   Respiratory: Positive for shortness of breath Cardiovascular: Negative for chest pain, palpitations and leg swelling.  Gastrointestinal: Negative for heartburn, abdominal pain, vomiting, diarrhea or consitpation Genitourinary: Negative for dysuria, urgency, frequency, hematuria Musculoskeletal: Negative for back pain or joint pain Neurological: Negative for dizziness, seizures, syncope, focal weakness,  numbness and headaches.  Hematological: Does not bruise/bleed easily.  Psychiatric/Behavioral: Negative for hallucinations, confusion, dysphoric mood    Tolerating Diet:yes      DRUG ALLERGIES:   Allergies  Allergen Reactions  . Naproxen Rash    VITALS:  Blood pressure 137/74, pulse 63, temperature 97.6 F (36.4 C), temperature source Oral, resp. rate 18, height 5\' 10"  (1.778 m), weight (!) 151.5 kg (334 lb), SpO2 100 %.  PHYSICAL EXAMINATION:  Constitutional: Appears well-developed and well-nourished. No distress. HENT: Normocephalic. Marland Kitchen Oropharynx is clear and moist.  Eyes: Conjunctivae and EOM are normal. PERRLA, no scleral icterus.  Neck: Normal ROM. Neck supple. No JVD. No tracheal deviation. CVS: RRR, S1/S2 +, no murmurs, no gallops, no carotid bruit.  Pulmonary: Effort and breath sounds normal, no stridor, rhonchi, wheezes, rales.  Abdominal: Soft. BS +,  mild distension, no tenderness, rebound or guarding.  Musculoskeletal:  Normal range of motion. 1+ lower extremity edema and no tenderness.  Neuro: Alert. CN 2-12 grossly intact. No focal deficits. Skin: Skin is warm and dry. No rash noted. Psychiatric: Normal mood and affect.      LABORATORY PANEL:   CBC Recent Labs  Lab 02/09/17 0547  WBC 15.7*  HGB 8.7*  HCT 27.8*  PLT 266   ------------------------------------------------------------------------------------------------------------------  Chemistries  Recent Labs  Lab 02/09/17 0547  NA 140  K 4.4  CL 105  CO2 26  GLUCOSE 172*  BUN 41*  CREATININE 4.50*  CALCIUM 8.4*   ------------------------------------------------------------------------------------------------------------------  Cardiac Enzymes Recent Labs  Lab 02/08/17 2314  TROPONINI 0.04*   ------------------------------------------------------------------------------------------------------------------  RADIOLOGY:  Dg Chest Portable 1 View  Result Date: 02/08/2017 CLINICAL DATA:  Shortness of breath. Symptom onset after dialysis today. EXAM: PORTABLE CHEST 1 VIEW COMPARISON:  01/25/2017 FINDINGS: Left internal jugular dialysis catheter tip in the distal SVC. Prior central line tip in removed. The heart is enlarged. Increased pulmonary edema from prior exam. Possible small left pleural effusion versus soft tissue attenuation from habitus. No pneumothorax. IMPRESSION: Cardiomegaly with increased pulmonary edema from prior exam. Possible left pleural effusion. Electronically Signed   By: Jeb Levering M.D.   On: 02/08/2017 23:37     ASSESSMENT AND PLAN:   56 year old male with end-stage renal disease on hemodialysis and chronic hypoxic respiratory failure on 3 L of oxygen who presented with shortness of breath   1. Acute on chronic hypoxic respiratory failure in the setting of acute diastolic heart failure and pulmonary edema/fluid overload  Patient will be weaned off of BiPAP   2. Acute on chronic diastolic heart  failure due to fluid overload: Patient will undergo hemodialysis today.   3. End-stage  renal disease and hematemesis: Patient will need to undergo hemodialysis.  4. Essential hypertension: Continue Coreg  5. HLD; Continue Atorvastatin  6. Diabetes: Continue SSI  7. Hypothyroid: Continue Synthroid  8. Depression: Continue Zoloft  Management plans discussed with the patient and he is in agreement.  CODE STATUS: FULL  TOTAL TIME TAKING CARE OF THIS PATIENT: 30 minutes.     POSSIBLE D/C 1-2 days, DEPENDING ON CLINICAL CONDITION.   [name] M.D on 02/09/2017 at 12:11 PM  Between 7am to 6pm - Pager - [phone] After 6pm go to [email or web address] - password [email or web address] [facility]  Office  [phone]  CC: Primary care physician; Lorelee Market, MD  Note: This dictation was prepared with Dragon dictation along with smaller phrase technology. Any transcriptional errors that result from this process are unintentional.

## 2017-02-09 NOTE — Progress Notes (Signed)
Pre HD assessment  

## 2017-02-09 NOTE — Progress Notes (Signed)
Hemo.-Treatment restarted safely.

## 2017-02-09 NOTE — ED Notes (Signed)
Lab called with a critical troponin of 0.04 Dr. Dahlia Client was notified.

## 2017-02-09 NOTE — Progress Notes (Signed)
[facility]  ROUNDING NOTE   Subjective:   [name] admitted to [facility] on 02/08/2017 for Shortness of breath [R06.02] Acute pulmonary edema ([place]) [J81.0] Pleural effusion [J90]  Received dialysis yesterday but only got to 147kg after treatment. Due to difficulty with hypotension  Patient became SOB of breath this morning and rapid response called. Patient brought to hemodialysis for emergent treatment.   Objective:  Vital signs in last 24 hours:  Temp:  [97.6 F (36.4 C)-98.8 F (37.1 C)] 98.8 F (37.1 C) (12/19 1205) Pulse Rate:  [63-90] 80 (12/19 1400) Resp:  [12-24] 18 (12/19 1400) BP: (83-161)/(55-91) 106/55 (12/19 1400) SpO2:  [75 %-100 %] 100 % (12/19 1400) Weight:  [141.5 kg (312 lb)-153 kg (337 lb 4.9 oz)] 153 kg (337 lb 4.9 oz) (12/19 1205)  Weight change:  Filed Weights   02/08/17 2310 02/09/17 0157 02/09/17 1205  Weight: (!) 141.5 kg (312 lb) (!) 151.5 kg (334 lb) (!) 153 kg (337 lb 4.9 oz)    Intake/Output: No intake/output data recorded.   Intake/Output this shift:  No intake/output data recorded.  Physical Exam: General: somnulent  Head: +BIPAP  Eyes: Anicteric, PERRL  Neck: Supple, trachea midline  Lungs:  Bilateral crackles  Heart: Regular rate and rhythm  Abdomen:  Soft, nontender,   Extremities:  +++ peripheral edema.  Neurologic: Nonfocal, moving all four extremities  Skin: No lesions  Access: permcath    Basic Metabolic Panel: Recent Labs  Lab 02/08/17 2314 02/09/17 0547  NA 141 140  K 3.9 4.4  CL 101 105  CO2 28 26  GLUCOSE 158* 172*  BUN 39* 41*  CREATININE 4.30* 4.50*  CALCIUM 8.4* 8.4*    Liver Function Tests: No results for input(s): AST, ALT, ALKPHOS, BILITOT, PROT, ALBUMIN in the last 168 hours. No results for input(s): LIPASE, AMYLASE in the last 168 hours. No results for input(s): AMMONIA in the last 168 hours.  CBC: Recent Labs  Lab 02/08/17 2314 02/09/17 0547  WBC 13.6* 15.7*  HGB 9.3*  8.7*  HCT 29.2* 27.8*  MCV 91.3 92.7  PLT 280 266    Cardiac Enzymes: Recent Labs  Lab 02/08/17 2314  TROPONINI 0.04*    BNP: Invalid input(s): POCBNP  CBG: Recent Labs  Lab 02/09/17 0304 02/09/17 0744  GLUCAP 164* 151*    Microbiology: Results for orders placed or performed during the hospital encounter of 01/23/17  CULTURE, BLOOD (ROUTINE X 2) w Reflex to ID Panel     Status: Abnormal   Collection Time: 01/23/17 11:44 AM  Result Value Ref Range Status   Specimen Description BLOOD RIGHT ANTECUBITAL  Final   Special Requests   Final    BOTTLES DRAWN AEROBIC AND ANAEROBIC Blood Culture results may not be optimal due to an excessive volume of blood received in culture bottles   Culture  Setup Time   Final    GRAM POSITIVE COCCI IN BOTH AEROBIC AND ANAEROBIC BOTTLES CRITICAL RESULT CALLED TO, READ BACK BY AND VERIFIED WITH: MATT MCBANE AT [place] ON 01/24/17 [place].    Culture STAPHYLOCOCCUS AUREUS (A)  Final   Report Status 01/26/2017 FINAL  Final   Organism ID, Bacteria STAPHYLOCOCCUS AUREUS  Final      Susceptibility   Staphylococcus aureus - MIC*    CIPROFLOXACIN <=0.5 SENSITIVE Sensitive     ERYTHROMYCIN <=0.25 SENSITIVE Sensitive     GENTAMICIN <=0.5 SENSITIVE Sensitive     OXACILLIN <=0.25 SENSITIVE Sensitive     TETRACYCLINE <=1  SENSITIVE Sensitive     VANCOMYCIN <=0.5 SENSITIVE Sensitive     TRIMETH/SULFA <=10 SENSITIVE Sensitive     CLINDAMYCIN <=0.25 SENSITIVE Sensitive     RIFAMPIN <=0.5 SENSITIVE Sensitive     Inducible Clindamycin NEGATIVE Sensitive     * STAPHYLOCOCCUS AUREUS  CULTURE, BLOOD (ROUTINE X 2) w Reflex to ID Panel     Status: Abnormal   Collection Time: 01/23/17 11:44 AM  Result Value Ref Range Status   Specimen Description BLOOD BLOOD LEFT FOREARM  Final   Special Requests   Final    BOTTLES DRAWN AEROBIC AND ANAEROBIC Blood Culture results may not be optimal due to an excessive volume of blood received in culture bottles   Culture  Setup  Time   Final    GRAM POSITIVE COCCI IN BOTH AEROBIC AND ANAEROBIC BOTTLES CRITICAL VALUE NOTED.  VALUE IS CONSISTENT WITH PREVIOUSLY REPORTED AND CALLED VALUE.    Culture (A)  Final    STAPHYLOCOCCUS AUREUS SUSCEPTIBILITIES PERFORMED ON PREVIOUS CULTURE WITHIN THE LAST 5 DAYS. Performed at [facility], [place] [address], [place], [place] [address]    Report Status 01/26/2017 FINAL  Final  Blood Culture ID Panel (Reflexed)     Status: Abnormal   Collection Time: 01/23/17 11:44 AM  Result Value Ref Range Status   Enterococcus species NOT DETECTED NOT DETECTED Final   Listeria monocytogenes NOT DETECTED NOT DETECTED Final   Staphylococcus species DETECTED (A) NOT DETECTED Final    Comment: CRITICAL RESULT CALLED TO, READ BACK BY AND VERIFIED WITH: MATT MCBANE AT 0700 ON 01/24/17 [place].    Staphylococcus aureus DETECTED (A) NOT DETECTED Final    Comment: Methicillin (oxacillin) susceptible Staphylococcus aureus (MSSA). Preferred therapy is anti staphylococcal beta lactam antibiotic (Cefazolin or Nafcillin), unless clinically contraindicated. CRITICAL RESULT CALLED TO, READ BACK BY AND VERIFIED WITH: MATT MCBANE AT 0700 ON 01/24/17 [place].    Methicillin resistance NOT DETECTED NOT DETECTED Final   Streptococcus species NOT DETECTED NOT DETECTED Final   Streptococcus agalactiae NOT DETECTED NOT DETECTED Final   Streptococcus pneumoniae NOT DETECTED NOT DETECTED Final   Streptococcus pyogenes NOT DETECTED NOT DETECTED Final   Acinetobacter baumannii NOT DETECTED NOT DETECTED Final   Enterobacteriaceae species NOT DETECTED NOT DETECTED Final   Enterobacter cloacae complex NOT DETECTED NOT DETECTED Final   Escherichia coli NOT DETECTED NOT DETECTED Final   Klebsiella oxytoca NOT DETECTED NOT DETECTED Final   Klebsiella pneumoniae NOT DETECTED NOT DETECTED Final   Proteus species NOT DETECTED NOT DETECTED Final   Serratia marcescens NOT DETECTED NOT DETECTED Final   Haemophilus influenzae  NOT DETECTED NOT DETECTED Final   Neisseria meningitidis NOT DETECTED NOT DETECTED Final   Pseudomonas aeruginosa NOT DETECTED NOT DETECTED Final   Candida albicans NOT DETECTED NOT DETECTED Final   Candida glabrata NOT DETECTED NOT DETECTED Final   Candida krusei NOT DETECTED NOT DETECTED Final   Candida parapsilosis NOT DETECTED NOT DETECTED Final   Candida tropicalis NOT DETECTED NOT DETECTED Final  C difficile quick scan w PCR reflex     Status: None   Collection Time: 01/23/17  1:24 PM  Result Value Ref Range Status   C Diff antigen NEGATIVE NEGATIVE Final   C Diff toxin NEGATIVE NEGATIVE Final   C Diff interpretation No C. difficile detected.  Final  MRSA PCR Screening     Status: None   Collection Time: 01/23/17  2:57 PM  Result Value Ref Range Status   MRSA by  PCR NEGATIVE NEGATIVE Final    Comment:        The GeneXpert MRSA Assay (FDA approved for NASAL specimens only), is one component of a comprehensive MRSA colonization surveillance program. It is not intended to diagnose MRSA infection nor to guide or monitor treatment for MRSA infections.   Culture, blood (single) w Reflex to ID Panel     Status: None   Collection Time: 01/24/17  4:34 PM  Result Value Ref Range Status   Specimen Description BLOOD BLOOD RIGHT HAND  Final   Special Requests   Final    BOTTLES DRAWN AEROBIC AND ANAEROBIC Blood Culture adequate volume   Culture NO GROWTH 5 DAYS  Final   Report Status 01/29/2017 FINAL  Final  Cath Tip Culture     Status: None (Preliminary result)   Collection Time: 01/24/17  4:42 PM  Result Value Ref Range Status   Specimen Description CATH TIP  Final   Special Requests NONE  Final   Culture   Final    >100,000 CFU STAPHYLOCOCCUS AUREUS SUSCEPTIBILITIES TO FOLLOW Performed at [facility], [place] [address], [place], [place] [address]    Report Status PENDING  Incomplete  Gastrointestinal Panel by PCR , Stool     Status: Abnormal   Collection Time:  01/24/17  9:23 PM  Result Value Ref Range Status   Campylobacter species NOT DETECTED NOT DETECTED Final   Plesimonas shigelloides NOT DETECTED NOT DETECTED Final   Salmonella species NOT DETECTED NOT DETECTED Final   Yersinia enterocolitica NOT DETECTED NOT DETECTED Final   Vibrio species NOT DETECTED NOT DETECTED Final   Vibrio cholerae NOT DETECTED NOT DETECTED Final   Enteroaggregative E coli (EAEC) NOT DETECTED NOT DETECTED Final   Enteropathogenic E coli (EPEC) DETECTED (A) NOT DETECTED Final    Comment: RESULT CALLED TO, READ BACK BY AND VERIFIED WITH: CHELSEA WRENN AT 0011 01/25/17 [place]    Enterotoxigenic E coli (ETEC) NOT DETECTED NOT DETECTED Final   Shiga like toxin producing E coli (STEC) NOT DETECTED NOT DETECTED Final   Shigella/Enteroinvasive E coli (EIEC) NOT DETECTED NOT DETECTED Final   Cryptosporidium NOT DETECTED NOT DETECTED Final   Cyclospora cayetanensis NOT DETECTED NOT DETECTED Final   Entamoeba histolytica NOT DETECTED NOT DETECTED Final   Giardia lamblia NOT DETECTED NOT DETECTED Final   Adenovirus F40/41 NOT DETECTED NOT DETECTED Final   Astrovirus NOT DETECTED NOT DETECTED Final   Norovirus GI/GII NOT DETECTED NOT DETECTED Final   Rotavirus A NOT DETECTED NOT DETECTED Final   Sapovirus (I, II, IV, and V) NOT DETECTED NOT DETECTED Final    Coagulation Studies: No results for input(s): LABPROT, INR in the last 72 hours.  Urinalysis: No results for input(s): COLORURINE, LABSPEC, PHURINE, GLUCOSEU, HGBUR, BILIRUBINUR, KETONESUR, PROTEINUR, UROBILINOGEN, NITRITE, LEUKOCYTESUR in the last 72 hours.  Invalid input(s): APPERANCEUR    Imaging: Dg Chest Portable 1 View  Result Date: 02/08/2017 CLINICAL DATA:  Shortness of breath. Symptom onset after dialysis today. EXAM: PORTABLE CHEST 1 VIEW COMPARISON:  01/25/2017 FINDINGS: Left internal jugular dialysis catheter tip in the distal SVC. Prior central line tip in removed. The heart is enlarged. Increased  pulmonary edema from prior exam. Possible small left pleural effusion versus soft tissue attenuation from habitus. No pneumothorax. IMPRESSION: Cardiomegaly with increased pulmonary edema from prior exam. Possible left pleural effusion. Electronically Signed   By: Jeb Levering M.D.   On: 02/08/2017 23:37     Medications:   . [START  ON 02/10/2017] ceFAZolin (ANCEF) IVPB - custom     . alteplase  2 mg Intracatheter Once  . aspirin EC  81 mg Oral Daily  . atorvastatin  80 mg Oral Daily  . carvedilol  6.25 mg Oral BID WC  . gabapentin  100 mg Oral QHS  . heparin  5,000 Units Subcutaneous Q8H  . insulin aspart  0-5 Units Subcutaneous QHS  . insulin aspart  0-9 Units Subcutaneous TID WC  . levothyroxine  50 mcg Oral QAC breakfast  . sertraline  25 mg Oral Daily   acetaminophen **OR** acetaminophen, ondansetron **OR** ondansetron (ZOFRAN) IV, oxyCODONE  Assessment/ Plan:  Mr. TILMAN MCCLAREN is a 56 y.o. white male with end stage renal disease on hemodialysis,diabetes mellitus type II insulin dependent, diabetic peripheral neuropathy, right toe amputation, hypertension, hyperlipidemia, COPD/tobacco use, depression, coronary artery disease  TTS [facility] permcath  1. ESRD on HD TTHS: volume overload and respiratory distress. EDW of 146kg. Patient is currently 151kg.  Emergent hemodialysis this morning through permcath Then resume TTS schedule May need daily dialysis.   2. Respiratory Failure: requiring noninvasive ventilation. History of obstructive sleep apnea  - Attempt high volume ultrafiltration as above.   3. Bacteremia: MSSA on 01/23/17. Cath exchanged Will need vein mapping before discharge - cefazolin 2g IV with HD treatment until 12/26  4. Hypertension: hypotensive currently. Carvedilol ordered, may need to hold.   5. Anemia of chronic kidney disease: hemoglobin 8.7 - epo ordered for TTS HD treatments.   6. Secondary Hyperparathyroidism: not  currently on binders.     LOS: [place], [place] 12/19/20182:35 PM

## 2017-02-09 NOTE — Consult Note (Signed)
Name: Brian [name] MRN: [id number] DOB: 07-Sep-1960    ADMISSION DATE:  02/08/2017 CONSULTATION DATE: 02/09/2017  REFERRING MD : Dr. Juleen China  CHIEF COMPLAINT: Hypotension   BRIEF PATIENT DESCRIPTION:  56 yo male with hx ESRD on hemodialysis admitted 12/18 with acute on chronic hypoxic respiratory failure secondary to pulmonary edema requiring Bipap transferred to stepdown unit 12/19 due to hypotension during hemodialysis   SIGNIFICANT EVENTS  12/18-Pt admitted to [facility] unit with acute hypoxic respiratory failure 12/19-Rapid response initiated due to pt becoming hypoxic during hemodialysis he required transfer to the stepdown unit due to hypotension during dialysis   STUDIES:  None   HISTORY OF PRESENT ILLNESS:   This is a 56 yo male with a PMH of Spinal Stenosis, Bacteremia-MSSA on 01/23/2017 cath exchanged, ESRD-HD T/Th/Sat, Pneumonia, Acute Pancreatitis, Neuropathy, Morbid Obesity, HTN, Hypercholesteremia, Type II Diabetes Mellitus, COPD, and CHF.  He presented to [facility] ER 12/18 with shortness of breath and generalized edema. He had hemodialysis on 12/18, however according to pt they did not pull any fluid off he was told he was under his dry weight.  In the ER lab results revealed BNP 1,783, creatinine 4.30, and CXR revealed pulmonary edema.  He was subsequently admitted to the [facility] unit for further workup and treatment.  On 12/19 a rapid response initiated due to pt becoming hypoxic during hemodialysis, due to hypotension he required transfer to the stepdown unit.    PAST MEDICAL HISTORY :   has a past medical history of CHF (congestive heart failure) ([place]), COPD (chronic obstructive pulmonary disease) ([place]), Diabetes mellitus type 2 in obese ([place]), DM (diabetes mellitus) type II controlled with renal manifestation ([place]), Hypercholesteremia, Hypertension, Morbid obesity with BMI of 45.0-49.9, adult ([place]), Neuropathy, Pancreatitis, acute, Pneumonia, and Spinal stenosis.  has a  past surgical history that includes Amputation; Cholecystectomy (1998); laparoscopic appendectomy (N/A, 01/06/2015); DIALYSIS/PERMA CATHETER INSERTION (N/A, 01/03/2017); DIALYSIS/PERMA CATHETER REMOVAL (N/A, 01/24/2017); TEE without cardioversion (N/A, 01/28/2017); and DIALYSIS/PERMA CATHETER INSERTION (N/A, 01/31/2017). Prior to Admission medications   Medication Sig Start Date End Date Taking? Authorizing Provider  albuterol (PROVENTIL HFA;VENTOLIN HFA) 108 (90 Base) MCG/ACT inhaler Inhale 1-2 puffs every 4 (four) hours as needed into the lungs for wheezing or shortness of breath.   Yes [provider]  aspirin EC 81 MG EC tablet Take 1 tablet (81 mg total) by mouth daily. 02/29/16  Yes [name], MD  atorvastatin (LIPITOR) 80 MG tablet Take 80 mg daily by mouth.   Yes [provider]  carvedilol (COREG) 6.25 MG tablet Take 1 tablet (6.25 mg total) by mouth 2 (two) times daily with a meal. 01/31/17  Yes Demetrios Loll, MD  ceFAZolin 2 g in dextrose 5 % 100 mL ivpb Inject 2 g into the vein every Tuesday, Thursday, and Saturday at 6 PM. Patient taking differently: Inject 2 g into the vein Every Tuesday,Thursday,and Saturday with dialysis.  02/01/17  Yes Demetrios Loll, MD  gabapentin (NEURONTIN) 100 MG capsule Take 1 capsule (100 mg total) by mouth at bedtime. 01/31/17  Yes Demetrios Loll, MD  levothyroxine (SYNTHROID, LEVOTHROID) 50 MCG tablet Take 50 mcg daily before breakfast by mouth.   Yes [provider]  sertraline (ZOLOFT) 25 MG tablet Take 25 mg by mouth daily.  04/10/13  Yes [provider]  Tiotropium Bromide-Olodaterol (STIOLTO RESPIMAT) 2.5-2.5 MCG/ACT AERS Inhale 2 puffs into the lungs daily.    Yes [provider]  Vitamin D, Ergocalciferol, (DRISDOL) 50000 units CAPS capsule Take 50,000 Units  by mouth every 7 (seven) days. Patient takes on Sunday   Yes [provider]   Allergies  Allergen Reactions  . Naproxen Rash    FAMILY HISTORY:    family history includes Heart disease in his father and maternal grandfather; Hyperlipidemia in his mother; Hypertension in his mother. SOCIAL HISTORY:  reports that he has been smoking cigarettes.  He has been smoking about 1.00 pack per day. he has never used smokeless tobacco. He reports that he does not drink alcohol or use drugs.  SUBJECTIVE: Patient is back after dialysis, on NRB, in no acute distress  VITAL SIGNS: Temp:  [97.6 F (36.4 C)-98.8 F (37.1 C)] 98.8 F (37.1 C) (12/19 1205) Pulse Rate:  [62-90] 69 (12/19 1645) Resp:  [12-24] 18 (12/19 1645) BP: (80-161)/(40-91) 86/41 (12/19 1645) SpO2:  [75 %-100 %] 100 % (12/19 1600) Weight:  [141.5 kg (312 lb)-153 kg (337 lb 4.9 oz)] 153 kg (337 lb 4.9 oz) (12/19 1205)  PHYSICAL EXAMINATION: General:  Morbidly obese,caucasian male ,on nrb , in no acute distress Neuro:  Awake,alert and oriented HEENT:  AT,[place],No jvd Cardiovascular:  S1s2,regular,no m/r/g Lungs:  rhonchorus through out, no crackles,wheezes noted Abdomen:  Large,round Musculoskeletal:  +2 BLE edema Skin:  Warm,dry and intact  Recent Labs  Lab 02/08/17 2314 02/09/17 0547  NA 141 140  K 3.9 4.4  CL 101 105  CO2 28 26  BUN 39* 41*  CREATININE 4.30* 4.50*  GLUCOSE 158* 172*   Recent Labs  Lab 02/08/17 2314 02/09/17 0547  HGB 9.3* 8.7*  HCT 29.2* 27.8*  WBC 13.6* 15.7*  PLT 280 266   Dg Chest Portable 1 View  Result Date: 02/08/2017 CLINICAL DATA:  Shortness of breath. Symptom onset after dialysis today. EXAM: PORTABLE CHEST 1 VIEW COMPARISON:  01/25/2017 FINDINGS: Left internal jugular dialysis catheter tip in the distal SVC. Prior central line tip in removed. The heart is enlarged. Increased pulmonary edema from prior exam. Possible small left pleural effusion versus soft tissue attenuation from habitus. No pneumothorax. IMPRESSION: Cardiomegaly with increased pulmonary edema from prior exam. Possible left pleural effusion. Electronically Signed    By: Jeb Levering M.D.   On: 02/08/2017 23:37    ASSESSMENT / PLAN: Acute on chronic hypoxic respiratory failure secondary to pulmonary edema ESRD on hemodialysis  Hypotension  Anemia of chronic kidney disease  Congestive Heart Failure Hx: Morbid Obesity, HTN, Diabetes Mellitis, and COPD  Hypothyroidism P: Prn Bipap for dyspnea and/or hypoxia Maintain O2 sats 88% to 92% Repeat CXR in am  Nephrology consulted- dialyzed on 12/19 and planning for 12/20 as well IV albumin Trend BMP Replace electrolytes as indicated Continue aspirin and lipitor  Subq heparin for VTE prophylaxis  Trend CBC Monitor for s/sx of bleeding  Transfuse for hgb <7 Blood Glucose checks with SSI  Continue Synthroid    Selam Pietsch,AG-ACNP Pulmonary & Critical Care

## 2017-02-09 NOTE — Progress Notes (Signed)
Called by pt RN for multiple desats into the mid70's. Placed pt on V60 Bipap to make adjustments as needed. Will cont to monitor pt through out the night.

## 2017-02-09 NOTE — Progress Notes (Signed)
HD tx end  

## 2017-02-09 NOTE — Progress Notes (Signed)
Pt changed to Auto BiPAP due to periods of apnea and desat on Auto CPAP, pt tol well, will continue to monitor

## 2017-02-10 ENCOUNTER — Inpatient Hospital Stay: Payer: Medicaid Other

## 2017-02-10 DIAGNOSIS — J9601 Acute respiratory failure with hypoxia: Secondary | ICD-10-CM

## 2017-02-10 DIAGNOSIS — I5033 Acute on chronic diastolic (congestive) heart failure: Secondary | ICD-10-CM

## 2017-02-10 DIAGNOSIS — J449 Chronic obstructive pulmonary disease, unspecified: Secondary | ICD-10-CM

## 2017-02-10 DIAGNOSIS — Z992 Dependence on renal dialysis: Secondary | ICD-10-CM

## 2017-02-10 DIAGNOSIS — N186 End stage renal disease: Secondary | ICD-10-CM

## 2017-02-10 DIAGNOSIS — E8779 Other fluid overload: Secondary | ICD-10-CM

## 2017-02-10 LAB — GLUCOSE, CAPILLARY
GLUCOSE-CAPILLARY: 92 mg/dL (ref 65–99)
GLUCOSE-CAPILLARY: 93 mg/dL (ref 65–99)
Glucose-Capillary: 82 mg/dL (ref 65–99)
Glucose-Capillary: 103 mg/dL — ABNORMAL HIGH (ref 65–99)

## 2017-02-10 LAB — BASIC METABOLIC PANEL
Anion gap: 9 (ref 5–15)
BUN: 21 mg/dL — AB (ref 6–20)
CHLORIDE: 103 mmol/L (ref 101–111)
CO2: 28 mmol/L (ref 22–32)
Calcium: 8.2 mg/dL — ABNORMAL LOW (ref 8.9–10.3)
Creatinine, Ser: 3.39 mg/dL — ABNORMAL HIGH (ref 0.61–1.24)
GFR calc Af Amer: 22 mL/min — ABNORMAL LOW (ref >60)
GFR calc non Af Amer: 19 mL/min — ABNORMAL LOW (ref >60)
Glucose, Bld: 80 mg/dL (ref 65–99)
POTASSIUM: 3.7 mmol/L (ref 3.5–5.1)
SODIUM: 140 mmol/L (ref 135–145)

## 2017-02-10 MED ORDER — IPRATROPIUM-ALBUTEROL 0.5-2.5 (3) MG/3ML IN SOLN
3.0000 mL | Freq: Four times a day (QID) | RESPIRATORY_TRACT | Status: DC
  Administered 2017-02-10 – 2017-02-11 (×3): 3 mL via RESPIRATORY_TRACT
  Filled 2017-02-10 (×4): qty 3

## 2017-02-10 MED ORDER — NYSTATIN 100000 UNIT/GM EX POWD
Freq: Three times a day (TID) | CUTANEOUS | Status: DC
  Administered 2017-02-10: 18:00:00 via TOPICAL
  Filled 2017-02-10: qty 15

## 2017-02-10 MED ORDER — ALBUMIN HUMAN 25 % IV SOLN
25.0000 g | Freq: Once | INTRAVENOUS | Status: DC
  Filled 2017-02-10: qty 100

## 2017-02-10 NOTE — Progress Notes (Signed)
Pre HD Tx  

## 2017-02-10 NOTE — Progress Notes (Signed)
Pre HD Assessment  

## 2017-02-10 NOTE — Progress Notes (Signed)
HD [place]

## 2017-02-10 NOTE — Plan of Care (Signed)
Care plan reviewed and updated

## 2017-02-10 NOTE — Assessment & Plan Note (Deleted)
blood glucose control important in reducing the progression of atherosclerotic disease. Also, involved in wound healing. On appropriate medications.  

## 2017-02-10 NOTE — Progress Notes (Signed)
[facility] [place] NAME: Brian [name]    MR#:  [id number]  DATE OF BIRTH:  April 11, 1960  SUBJECTIVE:   Patient was sent to the ICU after dialysis due to hypotension. He reports decreased shortness of breath. No dizziness or chest pain.  REVIEW OF SYSTEMS:    Review of Systems  Constitutional: Negative for fever, chills weight loss HENT: Negative for ear pain, nosebleeds, congestion, facial swelling, rhinorrhea, neck pain, neck stiffness and ear discharge.   Respiratory: Negative for shortness of breath Cardiovascular: Negative for chest pain, palpitations and leg swelling.  Gastrointestinal: Negative for heartburn, abdominal pain, vomiting, diarrhea or consitpation Genitourinary: Negative for dysuria, urgency, frequency, hematuria Musculoskeletal: Negative for back pain or joint pain Neurological: Negative for dizziness, seizures, syncope, focal weakness,  numbness and headaches.  Hematological: Does not bruise/bleed easily.  Psychiatric/Behavioral: Negative for hallucinations, confusion, dysphoric mood    Tolerating Diet:yes      DRUG ALLERGIES:   Allergies  Allergen Reactions  . Naproxen Rash    VITALS:  Blood pressure 117/62, pulse 71, temperature 99.6 F (37.6 C), temperature source Oral, resp. rate 14, height 5\' 10"  (1.778 m), weight (!) 147 kg (324 lb 1.2 oz), SpO2 96 %.  PHYSICAL EXAMINATION:  Constitutional: Appears well-developed and well-nourished. No distress. HENT: Normocephalic. Marland Kitchen Oropharynx is clear and moist.  Eyes: Conjunctivae and EOM are normal. PERRLA, no scleral icterus.  Neck: Normal ROM. Neck supple. No JVD. No tracheal deviation. CVS: RRR, S1/S2 +, no murmurs, no gallops, no carotid bruit.  Pulmonary: Effort and breath sounds normal, no stridor, rhonchi, wheezes, rales.  Abdominal: Soft. BS +,  mild distension, no tenderness, rebound or guarding.  Musculoskeletal: Normal range of motion. 1+ lower  extremity edema and no tenderness.  Neuro: Alert. CN 2-12 grossly intact. No focal deficits. Skin: Skin is warm and dry. No rash noted. Psychiatric: Normal mood and affect.      LABORATORY PANEL:   CBC Recent Labs  Lab 02/09/17 0547  WBC 15.7*  HGB 8.7*  HCT 27.8*  PLT 266   ------------------------------------------------------------------------------------------------------------------  Chemistries  Recent Labs  Lab 02/10/17 0406  NA 140  K 3.7  CL 103  CO2 28  GLUCOSE 80  BUN 21*  CREATININE 3.39*  CALCIUM 8.2*   ------------------------------------------------------------------------------------------------------------------  Cardiac Enzymes Recent Labs  Lab 02/08/17 2314  TROPONINI 0.04*   ------------------------------------------------------------------------------------------------------------------  RADIOLOGY:  Dg Chest Port 1 View  Result Date: 02/10/2017 CLINICAL DATA:  Followup pulmonary edema EXAM: PORTABLE CHEST 1 VIEW COMPARISON:  02/08/2017 FINDINGS: Central line unchanged with its tip at the SVC RA junction. Slightly last diffuse edema. Mild persistent basilar atelectasis. IMPRESSION: Radiographic improvement with slightly less pulmonary edema. Electronically Signed   By: Brian [name].   On: 02/10/2017 06:38   Dg Chest Portable 1 View  Result Date: 02/08/2017 CLINICAL DATA:  Shortness of breath. Symptom onset after dialysis today. EXAM: PORTABLE CHEST 1 VIEW COMPARISON:  01/25/2017 FINDINGS: Left internal jugular dialysis catheter tip in the distal SVC. Prior central line tip in removed. The heart is enlarged. Increased pulmonary edema from prior exam. Possible small left pleural effusion versus soft tissue attenuation from habitus. No pneumothorax. IMPRESSION: Cardiomegaly with increased pulmonary edema from prior exam. Possible left pleural effusion. Electronically Signed   By: Brian [name].   On: 02/08/2017 23:37     ASSESSMENT  AND PLAN:   56 year old male with end-stage renal disease on hemodialysis and chronic hypoxic respiratory failure  on 3 L of oxygen who presented with shortness of breath   1. Acute on chronic hypoxic respiratory failure in the setting of acute diastolic heart failure and pulmonary edema/fluid overload  Patient is weaned off of BiPAP   2. Acute on chronic diastolic heart failure due to fluid overload: Patient underwent hemodialysis yesterday and will have dialysis today as scheduled Tuesday, Thursday and Saturday.   3. End-stage renal disease and hematemesis: Into the dialysis as recommended by nephrology. He is normal scheduled Tuesday, Thursday, Saturday  4. Essential hypertension: Blood pressure was low after dialysis and therefore Corag has been discontinued for now 5. HLD; Continue Atorvastatin  6. Diabetes: Continue SSI  7. Hypothyroid: Continue Synthroid  8. Depression: Continue Zoloft  Management plans discussed with the patient and he is in agreement.  CODE STATUS: FULL  TOTAL TIME TAKING CARE OF THIS PATIENT: 22 minutes.   Physical therapy consultation for discharge planning  POSSIBLE D/C tomorrow, DEPENDING ON CLINICAL CONDITION.   Brian [name] on 02/10/2017 at 10:58 AM  Between 7am to 6pm - Pager - [phone] After 6pm go to [email or web address] - password [email or web address] [facility]  Office  [phone]  CC: Primary care physician; Brian Market, MD  Note: This dictation was prepared with Dragon dictation along with smaller phrase technology. Any transcriptional errors that result from this process are unintentional.

## 2017-02-10 NOTE — Progress Notes (Signed)
HD TX Started

## 2017-02-10 NOTE — Progress Notes (Signed)
Patient arrived from ICU.  Patient compliant with care.  He requested for me to turn off his lights because he wanted to sleep

## 2017-02-10 NOTE — Assessment & Plan Note (Deleted)
Likely an underlying cause of renal failure. blood pressure control important in reducing the progression of atherosclerotic disease. On appropriate oral medications.

## 2017-02-10 NOTE — Progress Notes (Signed)
Seq 2hrs completed

## 2017-02-10 NOTE — Progress Notes (Addendum)
Report called to [facility] on 2C, pt to transport on 2C bed with O2 tank, appetite remains poor, VSS post HD, A&O x 4, no UOP.  Belongings and chart to transfer with him, no meds in med bin.  Keys and inhaler from his room closet also sent with him

## 2017-02-10 NOTE — Progress Notes (Signed)
Comfortable on [place] O2 Denies pain and dyspnea No new complaints  Vitals:   02/10/17 1230 02/10/17 1245 02/10/17 1300 02/10/17 1315  BP: (!) 108/57 113/73 129/63 115/60  Pulse: 69 78 68 73  Resp: 14 11 15 19   Temp:      TempSrc:      SpO2: 96% 95% 95% 97%  Weight:      Height:       Obese, NAD HEENT WNL JVP cannot be visualized Bibasilar crackles, few scattered wheezes Regular, no M Obese, soft, NABS Extremities warm, symmetric edema No focal neurologic deficits  BMP Latest Ref Rng & Units 02/10/2017 02/09/2017 02/08/2017  Glucose 65 - 99 mg/dL 80 172(H) 158(H)  BUN 6 - 20 mg/dL 21(H) 41(H) 39(H)  Creatinine 0.61 - 1.24 mg/dL 3.39(H) 4.50(H) 4.30(H)  Sodium 135 - 145 mmol/L 140 140 141  Potassium 3.5 - 5.1 mmol/L 3.7 4.4 3.9  Chloride 101 - 111 mmol/L 103 105 101  CO2 22 - 32 mmol/L 28 26 28   Calcium 8.9 - 10.3 mg/dL 8.2(L) 8.4(L) 8.4(L)    CBC Latest Ref Rng & Units 02/09/2017 02/08/2017 01/29/2017  WBC 3.8 - 10.6 K/uL 15.7(H) 13.6(H) 9.7  Hemoglobin 13.0 - 18.0 g/dL 8.7(L) 9.3(L) 8.8(L)  Hematocrit 40.0 - 52.0 % 27.8(L) 29.2(L) 26.3(L)  Platelets 150 - 440 K/uL 266 280 268    CXR: CM, enlarged PAs, mild interstitial edema  IMPRESSION: Acute on chronic hypoxemic respiratory failure Smoker with history of COPD (no PFTs available to confirm) Acute pulmonary edema due to hypervolemia - improved after HD ESRD on chronic HD DM2 OSA  PLAN/REC: Transfer to MedSurg unit Continue supplemental oxygen Continue nebulized bronchodilators Continue scheduled nocturnal CPAP Further HD per Nephrology Continue SSI  After transfer, PCCM will sign off. Please call if we can be of further assistance    Merton Border, MD PCCM service Mobile [phone] Pager [phone] 02/10/2017 1:42 PM

## 2017-02-10 NOTE — Progress Notes (Deleted)
Patient ID: Brian [name], male   DOB: 06/01/1960, 56 y.o.   MRN: [id number]  No chief complaint on file.   HPI Brian [name] is a 56 y.o. male.  I am asked to see the patient by Dr. Juleen China for evaluation of permanent dialysis access.  The patient reports ***   Past Medical History: Diagnosis Date . CHF (congestive heart failure) ([place])  . COPD (chronic obstructive pulmonary disease) ([place])  . Diabetes mellitus type  2 in obese ([place])  . DM (diabetes mellitus) type II controlled with renal manifestation ([place])  . Hypercholesteremia  . Hypertension  . Morbid obesity with BMI of 45.0-49.9, adult ([place])  . Neuropathy  . Pancreatitis, acute  . Pneumonia  . Spinal stenosis    Past Surgical History: Procedure Laterality Date . AMPUTATION   . CHOLECYSTECTOMY  1998 . DIALYSIS/PERMA CATHETER INSERTION N/A 01/03/2017  Procedure: DIALYSIS/PERMA CA THETER INSERTION;  Surgeon: Algernon Huxley, MD;  Location: [place] [facility];  Service: Cardiovascular;  Laterality: N/A; . DIALYSIS/PERMA CATHETER INSERTION N/A 01/31/2017  Procedure: DIALYSIS/PERMA CATHETER INSERTION;  Surgeon: Algernon Huxley, MD;  Location: [place] [facility];  Service: Cardiovascular;  Laterality: N/A; . DIALYSIS/PERMA CATHETER REMOVAL N/A 01/24/2017  Procedure: DIALYSIS/PERMA CATHETER REMOVAL;  Surgeon: Lavell Luster son S, MD;  Location: [place] [facility];  Service: Cardiovascular;  Laterality: N/A; . LAPAROSCOPIC APPENDECTOMY N/A 01/06/2015  Procedure: APPENDECTOMY LAPAROSCOPIC drainage of peritoneal abcess;  Surgeon: Sherri Rad, MD;  Location: [facility];  Service: General;  Laterality: N/A; . TEE WITHOUT CARDIOVERSION N/A 01/28/2017  Procedure: TRANSESOPHAGEAL ECHOCARDIOGRAM (TEE);  Surgeon: Corey Skains, MD;  Location: [facility];  Servic e: Cardiovascular;  Laterality: N/A;   Family History Problem Relation Age of Onset . Hypertension Mother  . Hyperlipidemia Mother  . Heart disease Father  . Heart  disease Maternal Grandfather    Social History Social History  Tobacco Use . Smoking status: Current Every Day Smoker   Packs/day: 1.00   Types: Cigarettes . Smokeless tobacco: Never Used Substance Use Topics . Alcohol use: No . Drug use: No    Allerg ies Allergen Reactions . Naproxen Rash   No current facility-administered medications for this visit.   No current outpatient medications on file.  Facility-Administered Medications Ordered in Other Visits Medication Dose Route Frequency Provider Last Rate Last Dose . acetaminophen (TYLENOL) tablet 650 mg  650 mg Oral Q6H PRN Lance Coon, MD   650 mg at 02/09/17 1257  Or . acetaminophen (TYLENOL) suppository 650 mg  650 m g Rectal Q6H PRN Lance Coon, MD     . albumin human 25 % solution 25 g  25 g Intravenous Once Kolluru, Sarath, MD     . aspirin EC tablet 81 mg  81 mg Oral Daily Lance Coon, MD   81 mg at 02/10/17 1034 . atorvastatin (LIPITOR) tablet 80 mg  80 mg Oral Daily Lance Coon, MD     . epoetin alfa (EPOGEN,PROCRIT) injection 10,000 Units  10,000 Units Intravenous Q T,Th,Sa-HD Kolluru, Sarath, MD     . heparin injection 5,000 Units   5,000 Units Subcutaneous Camelia Phenes Lance Coon, MD   5,000 Units at 02/10/17 1033 . insulin aspart (novoLOG) injection 0-5 Units  0-5 Units Subcutaneous QHS Lance Coon, MD     . insulin aspart (novoLOG) injection 0-9 Units  0-9 Units Subcutaneous TID WC Lance Coon, MD   2 Units at 02/09/17 [phone] . levothyroxine (SYNTHROID, LEVOTHROID) tablet 50 mcg  50 mcg  Oral QAC breakfast Lance Coon, MD   50 mcg at 02/10/17 0827 . MEDLINE m outh rinse  15 mL Mouth Rinse BID Kolluru, Sarath, MD   15 mL at 02/10/17 1037 . ondansetron (ZOFRAN) tablet 4 mg  4 mg Oral Q6H PRN Lance Coon, MD      Or . ondansetron [facility]) injection 4 mg  4 mg Intravenous Q6H PRN Lance Coon, MD     . oxyCODONE (Oxy IR/ROXICODONE) immediate release tablet 5 mg  5 mg Oral Q6H PRN Bettey Costa, MD      . sertraline (ZOLOFT) tablet 25 mg  25 mg Oral Daily Lance Coon, MD   25 mg at 12/20/1 8 1034     REVIEW OF SYSTEMS (Negative unless checked)  Constitutional: Weight loss  Fever  Chills Cardiac: Chest pain   Chest pressure   Palpitations   Shortness of breath when laying flat   Shortness of breath at rest   Shortness of breath with exertion. Vascular:  Pain in legs with walking   Pain in legs at rest   Pain in legs when laying flat   Claudication   Pain in feet when walking  Pain in feet at rest  Pain in feet whe n laying flat   History of DVT   Phlebitis   Swelling in legs   Varicose veins   Non-healing ulcers Pulmonary:   Uses home oxygen   Productive cough   Hemoptysis   Wheeze  COPD   Asthma Neurologic:  Dizziness  Blackouts   Seizures   History of stroke   History of TIA  Aphasia   Temporary blindness   Dysphagia   Weakness or numbness in arms   Weakness or numbness in legs Musculoskeletal:  Arthritis   Joint swelling   Joint pain   Low  back pain Hematologic:  Easy bruising  Easy bleeding   Hypercoagulable state   Anemic  Hepatitis Gastrointestinal:  Blood in stool   Vomiting blood  Gastroesophageal reflux/heartburn   Abdominal pain Genitourinary:  Chronic kidney disease   Difficult urination  Frequent urination  Burning with urination   Hematuria Skin:  Rashes   Ulcers   Wounds Psychological:  History of anxiety    History of major depression.    Physical Exa m There were no vitals taken for this visit. Gen:  WD/WN, NAD Head: [place]/AT, No temporalis wasting. Prominent temp pulse not noted. Ear/Nose/Throat: Hearing grossly intact, nares w/o erythema or drainage, oropharynx w/o Erythema/Exudate Eyes: Conjunctiva clear, sclera non-icteric  Neck: trachea midline.  No bruit or JVD.  Pulmonary:  Good air movement, clear to auscultation bilaterally.  Cardiac: RRR, normal S1, S2, no Murmurs, ru bs or gallops. Vascular:  *** Vessel Right Left Radial Palpable Palpable Ulnar Palpable Palpable Brachial Palpable Palpable Carotid Palpable, without bruit Palpable, without bruit Aorta Not palpable N/A Femoral Palpable Palpable Popliteal Palpable Palpable PT Palpable Palpable DP Palpable Palpable  Gastrointestinal: soft, non-tender/non-distended. No guarding/reflex. No masses, surgical incisions, or scars. Musculoskeletal:  M/S 5/5 throughout.  Extremities without ischemic changes.  No deformity or atrophy. *** edema. Neurologic: Sensation grossly intact in extremities.  Symmetrical.  Speech is fluent. Motor exam as listed above. Psychiatric: Judgment intact, Mood & affect appropriate for pt's clinical situation. Dermatologic: No rashes or ulcers noted.  No cellulitis or open wounds. Lymph : No Cervical, Axillary, or Inguinal lymphadenopathy.  Data  Reviewed *** Radiology Dg Chest 1 View  Result Date: 01/25/2017 CLINICAL DATA:  Central line placed EXAM: CHEST  1 VIEW COMPARISON:  Portable chest x-ray of 01/23/2017 FINDINGS: The tip of the left IJ central venous line is at the approximate junction of the left innominate vein and SVC. No pneumothorax is seen. There is cardiomegaly present with probable mild pulmonary vascular congestion. No pleural effusion is seen. Right central  venous line tip is unchanged overlying the mid SVC. IMPRESSION: 1. Left central venous line tip overlies the junction of the left innominate vein and SVC. 2. Cardiomegaly and mild pulmonary vascular congestion. 3. Right central venous line tip overlies the mid SVC. Electronically Signed   By: Ivar Drape M.D.   On: 01/25/2017 16:53   Dg Chest 1 View  Result Date: 01/23/2017 CLINICAL DATA:  Central line placement. EXAM: CHEST 1 VIEW  COMPARISON:  01/23/2017 and prior radiographs FINDINGS: A left IJ central venous catheter is now noted with tip overlying upper SVC. A right IJ central venous catheter is again noted with tip overlying the mid  SVC. Cardiomegaly and pulmonary vascular congestion again identified. Slight decrease in interstitial opacities/edema noted. There is no evidence of pneumothorax. IMPRESSION: Left IJ central venous catheter placement with tip ove rlying the upper SVC. No evidence of pneumothorax. Slight decrease in interstitial opacities/edema. Cardiomegaly and pulmonary vascular congestion. Electronically Signed   By: Margarette Canada M.D.   On: 01/23/2017 16:26   Dg Chest Port 1 View  Result Date: 02/10/2017 CLINICAL DATA:  Followup pulmonary edema EXAM: PORTABLE CHEST 1 VIEW COMPARISON:  02/08/2017 FINDINGS: Central line unchanged with its tip at the SVC RA junction. Slightly  last diffuse edema. Mild persistent basilar atelectasis. IMPRESSION: Radiographic improvement with slightly less pulmonary edema. Electronically Signed   By: Nelson Chimes M.D.   On: 02/10/2017 06:38   Dg Chest Portable 1 View  Result Date: 02/08/2017 CLINICAL DATA:  Shortness of breath. Symptom onset after dialysis today. EXAM: PORTABLE CHEST 1 VIEW COMPARISON:  01/25/2017 FINDINGS: Left internal jugular dialysis catheter tip in t he distal SVC. Prior central line tip in removed. The heart is enlarged. Increased pulmonary edema from prior exam. Possible small left pleural effusion versus soft tissue attenuation from habitus. No pneumothorax. IMPRESSION: Cardiomegaly with increased pulmonary edema from prior exam. Possible left pleural effusion. Electronically Signed   By: Jeb Levering M.D.   On: 02/08/2017 23:37   Dg Chest Port 1 View  Result Date: 12/2/2 018 CLINICAL DATA:  Cough, fever EXAM: PORTABLE CHEST 1 VIEW COMPARISON:  12/31/2016 FINDINGS: Right dialysis catheter in place with the tip at the cavoatrial junction. Cardiomegaly with diffuse interstitial opacities likely edema. No visible effusions or acute bony abnormality. IMPRESSION: Cardiomegaly. Diffuse interstitial opacities in the mid and lower lungs, likely edema/ CHF.  Electronically Signed   By: Rolm Baptise M.D.   On: 12/ 03/2016 12:27    Labs Recent Results (from the past 2160 hour(s)) Basic metabolic panel     Status: Abnormal  Collection Time: 11/29/16 12:12 PM Result Value Ref Range  Sodium 140 135 - 145 mmol/L  Potassium 6.2 (H) 3.5 - 5.1 mmol/L  Chloride 109 101 - 111 mmol/L  CO2 23 22 - 32 mmol/L  Glucose, Bld 153 (H) 65 - 99 mg/dL  BUN 67 (H) 6 - 20 mg/dL  Creatinine, Ser 2.55 (H) 0.61 - 1.24 mg/dL  Calcium 9.3 8.9 - 10.3 mg/dL  G FR calc non Af Amer 26 (L) >60 mL/min  GFR calc Af Amer 31 (L) >60 mL/min   Comment: (NOTE) The eGFR has been calculated using the  CKD EPI equation. This calculation has not been validated in all clinical situations. eGFR's persistently <60 mL/min signify possible Chronic Kidney Disease.   Anion gap 8 5 - 15 Basic metabolic panel     Status: Abnormal  Collection Time: 12/29/16  4:04 PM Result Value Ref Range  Sodium 139 135  - 145 mmol/L  Potassium 4.7 3.5 - 5.1 mmol/L  Chloride 109 101 - 111 mmol/L  CO2 18 (L) 22 - 32 mmol/L  Glucose, Bld 189 (H) 65 - 99 mg/dL  BUN 71 (H) 6 - 20 mg/dL  Creatinine, Ser 4.40 (H) 0.61 - 1.24 mg/dL  Calcium 8.4 (L) 8.9 - 10.3 mg/dL  GFR calc non Af Amer 14 (L) >60 mL/min  GFR calc Af Amer 16 (L) >60 mL/min   Comment: (NOTE) The eGFR has been calculated using the CKD EPI equation. This calculation has not been valid ated in all clinical situations. eGFR's persistently <60 mL/min signify possible Chronic Kidney Disease.   Anion gap 12 5 - 15 CBC     Status: Abnormal  Collection Time: 12/29/16  4:04 PM Result Value Ref Range  WBC 10.4 3.8 - 10.6 K/uL  RBC 3.28 (L) 4.40 - 5.90 MIL/uL  Hemoglobin 9.9 (L) 13.0 - 18.0 g/dL  HCT 30.5 (L) 40.0 - 52.0 %  MCV 92.9 80.0 - 100.0 fL  MCH 30.1 26.0 - 34.0 pg  MCHC 32.4 32.0 - 36.0 g/dL  RDW 15.6 ( H) 11.5 - 14.5 %  Platelets 207 150 - 440 K/uL Troponin I     Status: Abnormal  Collection Time: 12/29/16  4:04  PM Result Value Ref Range  Troponin I 0.84 (HH) <0.03 ng/mL   Comment: CRITICAL RESULT CALLED TO, READ BACK BY AND VERIFIED WITH MEGAN JONES AT 1826 12/29/2016 BY TFK.  Brain natriuretic peptide     Status: Abnormal  Collection Time: 12/29/16  4:04 PM Result Value Ref Range  B Natriuretic Peptide 547.0 (H) 0.0 - 10 0.0 pg/mL Blood gas, venous     Status: Abnormal  Collection Time: 12/29/16  7:00 PM Result Value Ref Range  pH, Ven 7.05 (LL) 7.250 - 7.430   Comment: CRITICAL RESULT CALLED TO, READ BACK BY AND VERIFIED WITH: DR Cherylann Banas [id number] DT   pCO2, Ven 64 (H) 44.0 - 60.0 mmHg  pO2, Ven 54.0 (H) 32.0 - 45.0 mmHg  Bicarbonate 17.7 (L) 20.0 - 28.0 mmol/L  Acid-base deficit 12.8 (H) 0.0 - 2.0 mmol/L  O2 Saturation 70.2 %  Patien t temperature 37.0   Collection site LINE   Sample type VENOUS  APTT     Status: None  Collection Time: 12/29/16  8:31 PM Result Value Ref Range  aPTT 33 24 - 36 seconds Protime-INR     Status: None  Collection Time: 12/29/16  8:31 PM Result Value Ref Range  Prothrombin Time 14.0 11.4 - 15.2 seconds  INR 1.09  MRSA PCR Screening     Status: None  Collection Time: 12/30/16 12:36 AM Result Value Ref Range  MRSA by PCR NEGAT IVE NEGATIVE   Comment:        The GeneXpert MRSA Assay (FDA approved for NASAL specimens only), is one component of a comprehensive MRSA colonization surveillance program. It is not intended to diagnose MRSA infection nor to guide or monitor treatment for MRSA infections.  Glucose, capillary     Status: Abnormal  Collection Time: 12/30/16 12:36 AM Result Value Ref Range  Glucose-Capillary 178 (H) 65 - 99 mg/dL Troponin  I     Status: Abnormal  Collection Time: 12/30/16  1:52 AM Result Value Ref Range  Troponin I 1.43 (HH) <0.03 ng/mL   Comment: CRITICAL VALUE NOTED. VALUE IS CONSISTENT WITH PREVIOUSLY REPORTED/CALLED VALUE BY CAF  Glucose, capillary     Status: Abnormal  Collection Time: 12/30/16  4:04  AM Result Value Ref Range  Glucose-Capillary 287 (H) 65 - 99 mg/dL Heparin level (unfractionated)     Status: Abnormal  Collection Time: 11/ 08/18  4:17 AM Result Value Ref Range  Heparin Unfractionated 0.12 (L) 0.30 - 0.70 IU/mL   Comment:        IF HEPARIN RESULTS ARE BELOW EXPECTED VALUES, AND PATIENT DOSAGE HAS BEEN CONFIRMED, SUGGEST FOLLOW UP TESTING OF ANTITHROMBIN III LEVELS.  HIV antibody (Routine Testing)     Status: None  Collection Time: 12/30/16  4:17 AM Result Value Ref Range  HIV Screen 4th Generation wRfx Non Reactive Non Reactive   Comment: (NO TE) Performed At: [facility] [place], [place] [id number] Rush Farmer MD WU:[id number]  CBC     Status: Abnormal  Collection Time: 12/30/16  4:17 AM Result Value Ref Range  WBC 12.0 (H) 3.8 - 10.6 K/uL  RBC 3.45 (L) 4.40 - 5.90 MIL/uL  Hemoglobin 10.3 (L) 13.0 - 18.0 g/dL  HCT 31.7 (L) 40.0 - 52.0 %  MCV 91.9 80.0 - 100.0 fL  MCH 29.8 26.0 - 34.0 pg  MCHC 32.4 32.0 - 36.0 g/dL  RDW 15.6 (H) 11.5  - 14.5 %  Platelets 214 150 - 440 K/uL Creatinine, serum     Status: Abnormal  Collection Time: 12/30/16  4:17 AM Result Value Ref Range  Creatinine, Ser 4.50 (H) 0.61 - 1.24 mg/dL  GFR calc non Af Amer 13 (L) >60 mL/min  GFR calc Af Amer 15 (L) >60 mL/min   Comment: (NOTE) The eGFR has been calculated using the CKD EPI equation. This calculation has not been validated in all clinical situations. eGFR's persistently <60 mL/m in signify possible Chronic Kidney Disease.  Troponin I     Status: Abnormal  Collection Time: 12/30/16  7:05 AM Result Value Ref Range  Troponin I 1.74 (HH) <0.03 ng/mL   Comment: CRITICAL VALUE NOTED. VALUE IS CONSISTENT WITH PREVIOUSLY REPORTED/CALLED VALUE. QSD Basic metabolic panel     Status: Abnormal  Collection Time: 12/30/16  7:05 AM Result Value Ref Range  Sodium 137 135 - 145 mmol/L  Potassium 5.0 3.5 - 5.1 mmol/L   Chloride 109 101 - 111  mmol/L  CO2 17 (L) 22 - 32 mmol/L  Glucose, Bld 183 (H) 65 - 99 mg/dL  BUN 75 (H) 6 - 20 mg/dL  Creatinine, Ser 4.73 (H) 0.61 - 1.24 mg/dL  Calcium 8.2 (L) 8.9 - 10.3 mg/dL  GFR calc non Af Amer 12 (L) >60 mL/min  GFR calc Af Amer 14 (L) >60 mL/min   Comment: (NOTE) The eGFR has been calculated using the CKD EPI equation. This calculation has not been validated in all clinical situations. eGFR's pers istently <60 mL/min signify possible Chronic Kidney Disease.   Anion gap 11 5 - 15 CBC     Status: Abnormal  Collection Time: 12/30/16  7:05 AM Result Value Ref Range  WBC 12.2 (H) 3.8 - 10.6 K/uL  RBC 3.42 (L) 4.40 - 5.90 MIL/uL  Hemoglobin 10.3 (L) 13.0 - 18.0 g/dL  HCT 31.5 (L) 40.0 - 52.0 %  MCV 92.3 80.0 - 100.0 fL  MCH 30.0 26.0 -  34.0 pg  MCHC 32.5 32.0 - 36.0 g/dL  RDW 15.4 (H) 11.5 - 14.5 %  Platelets 194 150 - 4 40 K/uL Glucose, capillary     Status: Abnormal  Collection Time: 12/30/16  7:33 AM Result Value Ref Range  Glucose-Capillary 162 (H) 65 - 99 mg/dL Glucose, capillary     Status: Abnormal  Collection Time: 12/30/16 12:07 PM Result Value Ref Range  Glucose-Capillary 132 (H) 65 - 99 mg/dL Troponin I     Status: Abnormal  Collection Time: 12/30/16  1:07 PM Result Value Ref Range  Troponin I 1.84 (HH) <0.03 ng/mL   Comment: CRI TICAL VALUE NOTED. VALUE IS CONSISTENT WITH PREVIOUSLY REPORTED/CALLED VALUE.MSS ECHOCARDIOGRAM COMPLETE     Status: None  Collection Time: 12/30/16  3:21 PM Result Value Ref Range  Weight 5,784.87 oz  Height 70 in  BP 129/112 mmHg Glucose, capillary     Status: Abnormal  Collection Time: 12/30/16  5:17 PM Result Value Ref Range  Glucose-Capillary 159 (H) 65 - 99 mg/dL Glucose, capillary     Status: Abnormal  Collection Time : 12/30/16  9:03 PM Result Value Ref Range  Glucose-Capillary 175 (H) 65 - 99 mg/dL Glucose, capillary     Status: Abnormal  Collection Time: 12/31/16  4:21 AM Result Value Ref Range  Glucose-Capillary 177  (H) 65 - 99 mg/dL Basic metabolic panel     Status: Abnormal  Collection Time: 12/31/16  5:34 AM Result Value Ref Range  Sodium 135 135 - 145 mmol/L  Potassium 4.3 3.5 - 5.1 mmol/L  Chloride 109 101 - 111 mmol/L  CO2 16 ( L) 22 - 32 mmol/L  Glucose, Bld 170 (H) 65 - 99 mg/dL  BUN 94 (H) 6 - 20 mg/dL  Creatinine, Ser 5.46 (H) 0.61 - 1.24 mg/dL  Calcium 7.4 (L) 8.9 - 10.3 mg/dL  GFR calc non Af Amer 10 (L) >60 mL/min  GFR calc Af Amer 12 (L) >60 mL/min   Comment: (NOTE) The eGFR has been calculated using the CKD EPI equation. This calculation has not been validated in all clinical situations. eGFR's persistently <60 mL/min signify possible Chroni c Kidney Disease.   Anion gap 10 5 - 15 Glucose, capillary     Status: Abnormal  Collection Time: 12/31/16  8:05 AM Result Value Ref Range  Glucose-Capillary 159 (H) 65 - 99 mg/dL Blood gas, arterial     Status: Abnormal  Collection Time: 12/31/16  9:40 AM Result Value Ref Range  FIO2 0.21   pH, Arterial 7.18 (LL) 7.350 - 7.450   Comment: CRITICAL RESULT, NOTIFIED PHYSICIAN  KASA MD ON [id number] AT 1016   pCO2 arterial 39  32.0 - 48.0 mmHg  pO2, Arterial 50 (L) 83.0 - 108.0 mmHg  Bicarbonate 14.6 (L) 20.0 - 28.0 mmol/L  Acid-base deficit 12.9 (H) 0.0 - 2.0 mmol/L  O2 Saturation 73.7 %  Patient temperature 37.0   Collection site RIGHT RADIAL   Sample type ARTERIAL DRAW   Allens test (pass/fail) PASS PASS Glucose, capillary     Status: Abnormal  Collection Time: 12/31/16 11:20 AM Result Value Ref Range  Glucose-Capillary 162 (H) 65 - 99 mg/dL  Glucose, capillary     Status: Abnormal  Collection Time: 12/31/16  3:55 PM Result Value Ref Range  Glucose-Capillary 154 (H) 65 - 99 mg/dL Hepatitis B surface antigen     Status: None  Collection Time: 12/31/16  4:03 PM Result Value Ref Range  Hepatitis B Surface Ag Negative Negative   Comment: (NOTE)  Performed At: [facility] [place], [place]  [id number] Rush Farmer MD NW:[id number]  Hepatitis B  surface antibody     Status: None  Collection Time: 12/31/16  4:03 PM Result Value Ref Range  Hep B S Ab Non Reactive    Comment: (NOTE)              Non Reactive: Inconsistent with immunity,                            less than 10 mIU/mL              Reactive:     Consistent with immunity,                            greater than 9.9 mIU/mL Performed At: [facility] [place], [place] [id number] Rush Farmer MD BM:[id number]  Hepatitis B core antibody, total     Status: None  Collection Time: 12/31/16  4:03 PM Result Value Ref Range  Hep B Core Total Ab Negative Negative   Comment: (NOTE) Performed At: [facility] [address] [place], [place] [id number] Rush Farmer MD IH:[id number]  Parathyroid hormone, intact (no Ca)     Status: Abnormal  Collection Time: 12/31/16  4:03 PM Result Value Ref Range  PTH 228  (H) 15 - 65 pg/mL   Comment: (NOTE) Performed At: [facility] [place], [place] [id number] Rush Farmer MD JO:[id number]  CBC     Status: Abnormal  Collection Time: 12/31/16  4:03 PM Result Value Ref Range  WBC 9.8 3.8 - 10.6 K/uL  RBC 3.00 (L) 4.40 - 5.90 MIL/uL  Hemoglobin 9.0 (L) 13.0 - 18.0 g/dL  HCT 27.4 (L) 40.0 - 52.0 %  MCV 91.4 80.0 - 100.0 fL  MCH 30.0 26.0 - 34.0 pg  MCHC 32.8 32.0 - 3 6.0 g/dL  RDW 15.5 (H) 11.5 - 14.5 %  Platelets 199 150 - 440 K/uL Comprehensive metabolic panel     Status: Abnormal  Collection Time: 12/31/16  4:03 PM Result Value Ref Range  Sodium 136 135 - 145 mmol/L  Potassium 4.1 3.5 - 5.1 mmol/L  Chloride 106 101 - 111 mmol/L  CO2 18 (L) 22 - 32 mmol/L  Glucose, Bld 158 (H) 65 - 99 mg/dL  BUN 92 (H) 6 - 20 mg/dL   Comment: RESULT CONFIRMED BY MANUAL DILUTION JJB  Creatinine, Ser 5. 41 (H) 0.61 - 1.24 mg/dL  Calcium 7.3 (L) 8.9 - 10.3 mg/dL  Total Protein 6.7 6.5 - 8.1 g/dL  Albumin 2.7 (L) 3.5 - 5.0  g/dL  AST 15 15 - 41 U/L  ALT 29 17 - 63 U/L  Alkaline Phosphatase 126 38 - 126 U/L  Total Bilirubin 0.4 0.3 - 1.2 mg/dL  GFR calc non Af Amer 11 (L) >60 mL/min  GFR calc Af Amer 12 (L) >60 mL/min   Comment: (NOTE) The eGFR has been calculated using the CKD EPI equation. This calculation has not been valida ted in all clinical situations. eGFR's persistently <60 mL/min signify possible Chronic Kidney Disease.   Anion gap 12 5 - 15 Phosphorus     Status: Abnormal  Collection Time: 12/31/16  4:03 PM Result Value Ref Range  Phosphorus 9.0 (H) 2.5 - 4.6 mg/dL Glucose, capillary     Status: Abnormal  Collection Time: 12/31/16  7:39 PM Result Value  Ref Range  Glucose-Capillary 131 (H) 65 - 99 mg/dL  Comment 1 Notify RN   Comment 2  Document in Chart  Glucose, capillary     Status: Abnormal  Collection Time: 12/31/16  9:47 PM Result Value Ref Range  Glucose-Capillary 146 (H) 65 - 99 mg/dL  Comment 1 Notify RN   Comment 2 Document in Chart  Glucose, capillary     Status: Abnormal  Collection Time: 01/01/17 12:52 AM Result Value Ref Range  Glucose-Capillary 145 (H) 65 - 99 mg/dL Basic metabolic panel     Status: Abnormal  Collection Time: 01/01/17  4:30 A M Result Value Ref Range  Sodium 137 135 - 145 mmol/L  Potassium 4.0 3.5 - 5.1 mmol/L  Chloride 107 101 - 111 mmol/L  CO2 19 (L) 22 - 32 mmol/L  Glucose, Bld 131 (H) 65 - 99 mg/dL  BUN 91 (H) 6 - 20 mg/dL  Creatinine, Ser 4.95 (H) 0.61 - 1.24 mg/dL  Calcium 7.3 (L) 8.9 - 10.3 mg/dL  GFR calc non Af Amer 12 (L) >60 mL/min  GFR calc Af Amer 14 (L) >60 mL/min   Comment: (NOTE) The eGFR has been calculated using the CKD EPI equ ation. This calculation has not been validated in all clinical situations. eGFR's persistently <60 mL/min signify possible Chronic Kidney Disease.   Anion gap 11 5 - 15 Glucose, capillary     Status: Abnormal  Collection Time: 01/01/17  4:38 AM Result Value Ref Range  Glucose-Capillary 145 (H) 65 -  99 mg/dL  Comment 1 Notify RN   Comment 2 Document in Chart  Glucose, capillary     Status: Abnormal  Collection Time: 01/01/17   7:56 AM Result Value Ref Range  Glucose-Capillary 136 (H) 65 - 99 mg/dL Glucose, capillary     Status: Abnormal  Collection Time: 01/01/17 11:45 AM Result Value Ref Range  Glucose-Capillary 124 (H) 65 - 99 mg/dL  Comment 1 Notify RN  Glucose, capillary     Status: Abnormal  Collection Time: 01/01/17  4:43 PM Result Value Ref Range  Glucose-Capillary 174 (H) 65 - 99 mg/dL  Comment 1 Notify RN  Glucose, capillary     Status : Abnormal  Collection Time: 01/01/17  8:09 PM Result Value Ref Range  Glucose-Capillary 122 (H) 65 - 99 mg/dL Basic metabolic panel     Status: Abnormal  Collection Time: 01/02/17  3:26 AM Result Value Ref Range  Sodium 135 135 - 145 mmol/L  Potassium 4.3 3.5 - 5.1 mmol/L  Chloride 105 101 - 111 mmol/L  CO2 19 (L) 22 - 32 mmol/L  Glucose, Bld 157 (H) 65 - 99 mg/dL  BUN 107 (H) 6 - 20 mg/dL   Comment: RESULT CONFIRMED BY MA NUAL DILUTION/RWW  Creatinine, Ser 5.47 (H) 0.61 - 1.24 mg/dL  Calcium 7.4 (L) 8.9 - 10.3 mg/dL  GFR calc non Af Amer 10 (L) >60 mL/min  GFR calc Af Amer 12 (L) >60 mL/min   Comment: (NOTE) The eGFR has been calculated using the CKD EPI equation. This calculation has not been validated in all clinical situations. eGFR's persistently <60 mL/min signify possible Chronic Kidney Disease.   Anion gap 11 5 - 15 Hemoglobin A1c      Status: Abnormal  Collection Time: 01/02/17  3:27 AM Result Value Ref Range  Hgb A1c MFr Bld 6.8 (H) 4.8 - 5.6 %   Comment: (NOTE) Pre diabetes:          5.7%-6.4% Diabetes:              >  6.4% Glycemic control for   <7.0% adults with diabetes   Mean Plasma Glucose 148.46 mg/dL   Comment: Performed at [facility] [address], [place], [place] [address] Glucose, capillary     Status: Abnormal  Collection Time: 11/1 1/18  4:01 AM Result Value Ref  Range  Glucose-Capillary 143 (H) 65 - 99 mg/dL Glucose, capillary     Status: Abnormal  Collection Time: 01/02/17  7:47 AM Result Value Ref Range  Glucose-Capillary 127 (H) 65 - 99 mg/dL Glucose, capillary     Status: Abnormal  Collection Time: 01/02/17 11:47 AM Result Value Ref Range  Glucose-Capillary 117 (H) 65 - 99 mg/dL  Comment 1 Notify RN  CBC     Status: Abnormal  Collection Time: 11/1 1/18  3:00 PM Result Value Ref Range  WBC 9.9 3.8 - 10.6 K/uL  RBC 2.91 (L) 4.40 - 5.90 MIL/uL  Hemoglobin 8.8 (L) 13.0 - 18.0 g/dL  HCT 26.5 (L) 40.0 - 52.0 %  MCV 90.8 80.0 - 100.0 fL  MCH 30.3 26.0 - 34.0 pg  MCHC 33.4 32.0 - 36.0 g/dL  RDW 14.7 (H) 11.5 - 14.5 %  Platelets 171 150 - 440 K/uL Protime-INR     Status: None  Collection Time: 01/02/17  3:00 PM Result Value Ref Range  Prothrombin Time 14.3 11.4 - 15.2 second s  INR 1.12  Glucose, capillary     Status: Abnormal  Collection Time: 01/02/17  5:00 PM Result Value Ref Range  Glucose-Capillary 132 (H) 65 - 99 mg/dL  Comment 1 Notify RN  Glucose, capillary     Status: Abnormal  Collection Time: 01/02/17  8:22 PM Result Value Ref Range  Glucose-Capillary 128 (H) 65 - 99 mg/dL Glucose, capillary     Status: Abnormal  Collection Time: 01/03/17 12:33 AM Result Value Ref Range  Glucose-Cap illary 101 (H) 65 - 99 mg/dL Renal function panel     Status: Abnormal  Collection Time: 01/03/17  2:58 AM Result Value Ref Range  Sodium 136 135 - 145 mmol/L  Potassium 4.2 3.5 - 5.1 mmol/L  Chloride 105 101 - 111 mmol/L  CO2 18 (L) 22 - 32 mmol/L  Glucose, Bld 107 (H) 65 - 99 mg/dL  BUN 121 (H) 6 - 20 mg/dL   Comment: RESULT CONFIRMED BY MANUAL DILUTION/RWW  Creatinine, Ser 5.96 (H) 0.61 - 1.24 mg/dL  Calcium 7.9 (L) 8.9 -  10.3 mg/dL  Phosphorus 10.5 (H) 2.5 - 4.6 mg/dL  Albumin 3.3 (L) 3.5 - 5.0 g/dL  GFR calc non Af Amer 9 (L) >60 mL/min  GFR calc Af Amer 11 (L) >60 mL/min   Comment: (NOTE) The eGFR has been calculated using  the CKD EPI equation. This calculation has not been validated in all clinical situations. eGFR's persistently <60 mL/min signify possible Chronic Kidney Disease.   Anion gap 13 5 - 15 Glucose, capillary     Status: Abno rmal  Collection Time: 01/03/17  4:58 AM Result Value Ref Range  Glucose-Capillary 105 (H) 65 - 99 mg/dL Glucose, capillary     Status: Abnormal  Collection Time: 01/03/17  7:30 AM Result Value Ref Range  Glucose-Capillary 105 (H) 65 - 99 mg/dL Glucose, capillary     Status: Abnormal  Collection Time: 01/03/17  1:20 PM Result Value Ref Range  Glucose-Capillary 108 (H) 65 - 99 mg/dL Glucose, capillary     Status: None  Colle ction Time: 01/03/17  9:13 PM Result Value Ref Range  Glucose-Capillary 82 65 -  99 mg/dL Glucose, capillary     Status: Abnormal  Collection Time: 01/04/17  1:22 AM Result Value Ref Range  Glucose-Capillary 112 (H) 65 - 99 mg/dL Glucose, capillary     Status: None  Collection Time: 01/04/17  4:08 AM Result Value Ref Range  Glucose-Capillary 96 65 - 99 mg/dL  Comment 1 Notify RN  Basic metabolic panel     Status: Abnormal  C ollection Time: 01/04/17  7:11 AM Result Value Ref Range  Sodium 137 135 - 145 mmol/L  Potassium 5.1 3.5 - 5.1 mmol/L   Comment: HEMOLYSIS AT THIS LEVEL MAY AFFECT RESULT  Chloride 104 101 - 111 mmol/L  CO2 18 (L) 22 - 32 mmol/L  Glucose, Bld 94 65 - 99 mg/dL  BUN 92 (H) 6 - 20 mg/dL  Creatinine, Ser 4.79 (H) 0.61 - 1.24 mg/dL  Calcium 8.0 (L) 8.9 - 10.3 mg/dL  GFR calc non Af Amer 12 (L) >60 mL/min  GFR calc Af Amer 14 (L)  >60 mL/min   Comment: (NOTE) The eGFR has been calculated using the CKD EPI equation. This calculation has not been validated in all clinical situations. eGFR's persistently <60 mL/min signify possible Chronic Kidney Disease.   Anion gap 15 5 - 15 Glucose, capillary     Status: Abnormal  Collection Time: 01/04/17  7:49 AM Result Value Ref Range  Glucose-Capillary 104 (H) 65 - 99  mg/dL Glucose, capillary     Status: Abnormal   Collection Time: 01/04/17  4:43 PM Result Value Ref Range  Glucose-Capillary 159 (H) 65 - 99 mg/dL Glucose, capillary     Status: Abnormal  Collection Time: 01/04/17  8:03 PM Result Value Ref Range  Glucose-Capillary 158 (H) 65 - 99 mg/dL  Comment 1 Notify RN  Glucose, capillary     Status: Abnormal  Collection Time: 01/05/17 12:26 AM Result Value Ref Range  Glucose-Capillary 139 (H) 65 - 99 mg/dL  Comment 1 Notify RN  Gl ucose, capillary     Status: Abnormal  Collection Time: 01/05/17  3:40 AM Result Value Ref Range  Glucose-Capillary 140 (H) 65 - 99 mg/dL  Comment 1 Notify RN  Glucose, capillary     Status: Abnormal  Collection Time: 01/05/17  7:34 AM Result Value Ref Range  Glucose-Capillary 106 (H) 65 - 99 mg/dL  Comment 1 Notify RN  CBC     Status: Abnormal  Collection Time: 01/05/17  8:53 AM Result Value Ref Range  WBC 11.2 (H) 3.8 - 1 0.6 K/uL  RBC 2.96 (L) 4.40 - 5.90 MIL/uL  Hemoglobin 8.7 (L) 13.0 - 18.0 g/dL  HCT 26.5 (L) 40.0 - 52.0 %  MCV 89.4 80.0 - 100.0 fL  MCH 29.3 26.0 - 34.0 pg  MCHC 32.8 32.0 - 36.0 g/dL  RDW 14.6 (H) 11.5 - 14.5 %  Platelets 223 150 - 440 K/uL Basic metabolic panel     Status: Abnormal  Collection Time: 01/05/17  8:53 AM Result Value Ref Range  Sodium 135 135 - 145 mmol/L  Potassium 3.4 (L) 3.5 - 5.1 mmol/L  Chloride 96 (L ) 101 - 111 mmol/L  CO2 26 22 - 32 mmol/L  Glucose, Bld 148 (H) 65 - 99 mg/dL  BUN 64 (H) 6 - 20 mg/dL  Creatinine, Ser 3.68 (H) 0.61 - 1.24 mg/dL  Calcium 7.7 (L) 8.9 - 10.3 mg/dL  GFR calc non Af Amer 17 (L) >60 mL/min  GFR calc Af Amer 20 (L) >60 mL/min   Comment: (NOTE) The eGFR  has been calculated using the CKD EPI equation. This calculation has not been validated in all clinical situations. eGFR's persistently <60 mL/mi n signify possible Chronic Kidney Disease.   Anion gap 13 5 - 15 Iron and TIBC     Status: Abnormal  Collection Time: 01/05/17  8:53  AM Result Value Ref Range  Iron 36 (L) 45 - 182 ug/dL  TIBC 304 250 - 450 ug/dL  Saturation Ratios 12 (L) 17.9 - 39.5 %  UIBC 268 ug/dL Ferritin     Status: None  Collection Time: 01/05/17  8:53 AM Result Value Ref Range  Ferritin 77 24 - 336 ng/mL Transferrin     Status: None  Collection T ime: 01/05/17  8:53 AM Result Value Ref Range  Transferrin 207 180 - 329 mg/dL   Comment: Performed at [facility], [place] [address] [place], [place] [address] Glucose, capillary     Status: Abnormal  Collection Time: 01/05/17  4:50 PM Result Value Ref Range  Glucose-Capillary 169 (H) 65 - 99 mg/dL  Comment 1 Notify RN  Glucose, capillary     Status: Abnormal  Collection Time: 01/05/17  8:37 PM Result Value Ref Range   Glucose-Capillary 150 (H) 65 - 99 mg/dL Glucose, capillary     Status: Abnormal  Collection Time: 01/06/17 12:06 AM Result Value Ref Range  Glucose-Capillary 147 (H) 65 - 99 mg/dL Glucose, capillary     Status: Abnormal  Collection Time: 01/06/17  4:10 AM Result Value Ref Range  Glucose-Capillary 137 (H) 65 - 99 mg/dL Basic metabolic panel     Status: Abnormal  Collection Time: 01/06/17  4:18 AM Result Value Ref Range  Sodiu m 137 135 - 145 mmol/L  Potassium 3.0 (L) 3.5 - 5.1 mmol/L  Chloride 97 (L) 101 - 111 mmol/L  CO2 29 22 - 32 mmol/L  Glucose, Bld 121 (H) 65 - 99 mg/dL  BUN 38 (H) 6 - 20 mg/dL  Creatinine, Ser 2.84 (H) 0.61 - 1.24 mg/dL  Calcium 8.1 (L) 8.9 - 10.3 mg/dL  GFR calc non Af Amer 23 (L) >60 mL/min  GFR calc Af Amer 27 (L) >60 mL/min   Comment: (NOTE) The eGFR has been calculated using the CKD EPI equation. This calculation has n ot been validated in all clinical situations. eGFR's persistently <60 mL/min signify possible Chronic Kidney Disease.   Anion gap 11 5 - 15 Glucose, capillary     Status: Abnormal  Collection Time: 01/06/17  7:42 AM Result Value Ref Range  Glucose-Capillary 124 (H) 65 - 99 mg/dL Glucose, capillary     Status:  Abnormal  Collection Time: 01/06/17 11:34 AM Result Value Ref Range  Glucose-Capillary 141 (H) 65 - 99 mg/dL Glucose , capillary     Status: Abnormal  Collection Time: 01/10/17 10:23 AM Result Value Ref Range  Glucose-Capillary 213 (H) 65 - 99 mg/dL Blood gas, venous     Status: Abnormal  Collection Time: 01/23/17 11:40 AM Result Value Ref Range  pH, Ven 7.31 7.250 - 7.430  pCO2, Ven 46 44.0 - 60.0 mmHg  Bicarbonate 23.2 20.0 - 28.0 mmol/L  Acid-base deficit 3.1 (H) 0.0 - 2.0 mmol/L  Patient temperature 37.0   Collection site VEIN   Sampl e type VEIN  CBC with Differential     Status: Abnormal  Collection Time: 01/23/17 11:44 AM Result Value Ref Range  WBC 9.9 3.8 - 10.6 K/uL  RBC 3.30 (L) 4.40 - 5.90 MIL/uL  Hemoglobin 9.9 (L) 13.0 - 18.0  g/dL  HCT 30.2 (L) 40.0 - 52.0 %  MCV 91.5 80.0 - 100.0 fL  MCH 30.0 26.0 - 34.0 pg  MCHC 32.8 32.0 - 36.0 g/dL  RDW 15.3 (H) 11.5 - 14.5 %  Platelets 218 150 - 440 K/uL  Neutrophils Relative % 85 %  Neutro Abs 8.5 (H) 1. 4 - 6.5 K/uL  Lymphocytes Relative 7 %  Lymphs Abs 0.6 (L) 1.0 - 3.6 K/uL  Monocytes Relative 7 %  Monocytes Absolute 0.7 0.2 - 1.0 K/uL  Eosinophils Relative 0 %  Eosinophils Absolute 0.0 0 - 0.7 K/uL  Basophils Relative 1 %  Basophils Absolute 0.1 0 - 0.1 K/uL Comprehensive metabolic panel     Status: Abnormal  Collection Time: 01/23/17 11:44 AM Result Value Ref Range  Sodium 132 (L) 135 - 145 mmol/L  Potassium 4.3 3.5 -  5.1 mmol/L  Chloride 100 (L) 101 - 111 mmol/L  CO2 20 (L) 22 - 32 mmol/L  Glucose, Bld 182 (H) 65 - 99 mg/dL  BUN 52 (H) 6 - 20 mg/dL  Creatinine, Ser 4.92 (H) 0.61 - 1.24 mg/dL  Calcium 8.5 (L) 8.9 - 10.3 mg/dL  Total Protein 7.3 6.5 - 8.1 g/dL  Albumin 3.0 (L) 3.5 - 5.0 g/dL  AST 119 (H) 15 - 41 U/L  ALT 30 17 - 63 U/L  Alkaline Phosphatase 68 38 - 126 U/L  Total Bilirubin 0.4 0.3 - 1.2 mg/dL  GFR calc non Af Amer 12 (L)  >60 mL/min  GFR calc Af Amer 14 (L) >60  mL/min   Comment: (NOTE) The eGFR has been calculated using the CKD EPI equation. This calculation has not been validated in all clinical situations. eGFR's persistently <60 mL/min signify possible Chronic Kidney Disease.   Anion gap 12 5 - 15 CULTURE, BLOOD (ROUTINE X 2) w Reflex to ID Panel     Status: Abnormal  Collection Time: 01/23/17 11:44 AM Result Value Ref Range  Specimen Desc ription BLOOD RIGHT ANTECUBITAL   Special Requests     BOTTLES DRAWN AEROBIC AND ANAEROBIC Blood Culture results may not be optimal due to an excessive volume of blood received in culture bottles  Culture  Setup Time     GRAM POSITIVE COCCI IN BOTH AEROBIC AND ANAEROBIC BOTTLES CRITICAL RESULT CALLED TO, READ BACK BY AND VERIFIED WITH: MATT MCBANE AT [place] ON 01/24/17 [place].   Culture STAPHYLOCOCCUS AUREUS (A)   Report Status 12/ 06/2016 FINAL   Organism ID, Bacteria STAPHYLOCOCCUS AUREUS      Susceptibility  Staphylococcus aureus - MIC*   CIPROFLOXACIN <=0.5 SENSITIVE Sensitive    ERYTHROMYCIN <=0.25 SENSITIVE Sensitive    GENTAMICIN <=0.5 SENSITIVE Sensitive    OXACILLIN <=0.25 SENSITIVE Sensitive    TETRACYCLINE <=1 SENSITIVE Sensitive    VANCOMYCIN <=0.5 SENSITIVE Sensitive    TRIMETH/SULFA <=10 SENSITIVE Sensitive    CLINDAMYCIN <=0.25 SENSITIVE  Sensitive    RIFAMPIN <=0.5 SENSITIVE Sensitive    Inducible Clindamycin NEGATIVE Sensitive    * STAPHYLOCOCCUS AUREUS CULTURE, BLOOD (ROUTINE X 2) w Reflex to ID Panel     Status: Abnormal  Collection Time: 01/23/17 11:44 AM Result Value Ref Range  Specimen Description BLOOD BLOOD LEFT FOREARM   Special Requests     BOTTLES DRAWN AEROBIC AND ANAEROBIC Blood Culture results may not be optimal due to an excessive volume of blood  received in culture bottles  Culture  Setup Time     GRAM POSITIVE COCCI IN BOTH AEROBIC AND ANAEROBIC BOTTLES CRITICAL VALUE NOTED.  VALUE IS CONSISTENT WITH PREVIOUSLY REPORTED AND CALLED  VALUE.   Culture (A)    STAPHYLOCOCCUS AUREUS SUSCEPTIBILITIES PERFORMED ON PREVIOUS CULTURE WITHIN THE LAST 5 DAYS. Performed at [facility], [place] [address], [place], [place] [address]   Report Status 01/26/2017 FINAL  Lactic aci d, plasma     Status: None  Collection Time: 01/23/17 11:44 AM Result Value Ref Range  Lactic Acid, Venous 1.5 0.5 - 1.9 mmol/L Lipase, blood     Status: None  Collection Time: 01/23/17 11:44 AM Result Value Ref Range  Lipase 22 11 - 51 U/L Troponin I     Status: Abnormal  Collection Time: 01/23/17 11:44 AM Result Value Ref Range  Troponin I >65.00 (HH) <0.03 ng/mL   Comment: CRITICAL RESULT CALLED TO, READ BACK BY AND VERIF IED WITH FELICIA STAROPOLI ON 29/5/28 AT 1226 [facility]  APTT     Status: Abnormal  Collection Time: 01/23/17 11:44 AM Result Value Ref Range  aPTT 37 (H) 24 - 36 seconds   Comment:        IF BASELINE aPTT IS ELEVATED, SUGGEST PATIENT RISK ASSESSMENT BE USED TO DETERMINE APPROPRIATE ANTICOAGULANT THERAPY.  Protime-INR     Status: Abnormal  Collection Time: 01/23/17 11:44 AM Result Value Ref Range  Prothrombin Time 15.9 (H) 11. 4 - 15.2 seconds  INR 1.28  Blood Culture ID Panel (Reflexed)     Status: Abnormal  Collection Time: 01/23/17 11:44 AM Result Value Ref Range  Enterococcus species NOT DETECTED NOT DETECTED  Listeria monocytogenes NOT DETECTED NOT DETECTED  Staphylococcus species DETECTED (A) NOT DETECTED   Comment: CRITICAL RESULT CALLED TO, READ BACK BY AND VERIFIED WITH: MATT MCBANE AT 0700 ON 01/24/17 [place].   Staphylococcus aureus DETECTED  (A) NOT DETECTED   Comment: Methicillin (oxacillin) susceptible Staphylococcus aureus (MSSA). Preferred therapy is anti staphylococcal beta lactam antibiotic (Cefazolin or Nafcillin), unless clinically contraindicated. CRITICAL RESULT CALLED TO, READ BACK BY AND VERIFIED WITH: MATT MCBANE AT 0700 ON 01/24/17 [place].   Methicillin resistance NOT DETECTED NOT  DETECTED  Streptococcus species NOT DETECTED NOT DETECTED  Streptococcus agal actiae NOT DETECTED NOT DETECTED  Streptococcus pneumoniae NOT DETECTED NOT DETECTED  Streptococcus pyogenes NOT DETECTED NOT DETECTED  Acinetobacter baumannii NOT DETECTED NOT DETECTED  Enterobacteriaceae species NOT DETECTED NOT DETECTED  Enterobacter cloacae complex NOT DETECTED NOT DETECTED  Escherichia coli NOT DETECTED NOT DETECTED  Klebsiella oxytoca NOT DETECTED NOT DETECTED  Klebsiella pneumoniae NOT DETECTED NOT DETECT ED  Proteus species NOT DETECTED NOT DETECTED  Serratia marcescens NOT DETECTED NOT DETECTED  Haemophilus influenzae NOT DETECTED NOT DETECTED  Neisseria meningitidis NOT DETECTED NOT DETECTED  Pseudomonas aeruginosa NOT DETECTED NOT DETECTED  Candida albicans NOT DETECTED NOT DETECTED  Candida glabrata NOT DETECTED NOT DETECTED  Candida krusei NOT DETECTED NOT DETECTED  Candida parapsilosis NOT DETECTED NOT DETECTED  Candida  tropicalis NOT DETECTED NOT DETECTED C difficile quick scan w PCR reflex     Status: None  Collection Time: 01/23/17  1:24 PM Result Value Ref Range  C Diff antigen NEGATIVE NEGATIVE  C Diff toxin NEGATIVE NEGATIVE  C Diff interpretation No C. difficile detected.  Glucose, capillary     Status: Abnormal  Collection Time: 01/23/17  2:20 PM Result Value Ref Range  Glucose-Capillary 197 (H) 65 - 99 mg/dL MRSA PCR Screening     S tatus: None  Collection Time: 01/23/17  2:57 PM Result Value Ref Range  MRSA by PCR NEGATIVE NEGATIVE   Comment:  The GeneXpert MRSA Assay (FDA approved for NASAL specimens only), is one component of a comprehensive MRSA colonization surveillance program. It is not intended to diagnose MRSA infection nor to guide or monitor treatment for MRSA infections.  Troponin I     Status: Abnormal  Collection Time: 01/23/17   3:09 PM Result Value Ref Range  Troponin I 62.91 (HH) <0.03 ng/mL   Comment: CRITICAL VALUE NOTED.  VALUE IS CONSISTENT WITH PREVIOUSLY REPORTED/CALLED VALUE BY CAF  Glucose, capillary     Status: Abnormal  Collection Time: 01/23/17  5:07 PM Result Value Ref Range  Glucose-Capillary 129 (H) 65 - 99 mg/dL Troponin I     Status: Abnormal  Collection Time: 01/23/17  8:57 PM Result Value Ref Range  Troponin I 53.26 (HH) <0.03 ng /mL   Comment: CRITICAL VALUE NOTED. VALUE IS CONSISTENT WITH PREVIOUSLY REPORTED/CALLED VALUE BY CAF  Heparin level (unfractionated)     Status: Abnormal  Collection Time: 01/23/17  8:57 PM Result Value Ref Range  Heparin Unfractionated <0.10 (L) 0.30 - 0.70 IU/mL   Comment:        IF HEPARIN RESULTS ARE BELOW EXPECTED VALUES, AND PATIENT DOSAGE HAS BEEN CONFIRMED, SUGGEST FOLLOW UP TESTING OF ANTITHROMBIN III LEVELS. REP EATED TO VERIFY  Magnesium     Status: None  Collection Time: 01/23/17  8:57 PM Result Value Ref Range  Magnesium 1.7 1.7 - 2.4 mg/dL Phosphorus     Status: None  Collection Time: 01/23/17  8:57 PM Result Value Ref Range  Phosphorus 3.8 2.5 - 4.6 mg/dL Blood gas, arterial     Status: Abnormal  Collection Time: 01/23/17  9:43 PM Result Value Ref Range  FIO2 0.28   Delivery systems BILEVEL POSITIVE AIRWAY PRESSURE   Inspira tory PAP 20   Expiratory PAP 10.0   pH, Arterial 7.33 (L) 7.350 - 7.450  pCO2 arterial 37 32.0 - 48.0 mmHg  pO2, Arterial 99 83.0 - 108.0 mmHg  Bicarbonate 19.5 (L) 20.0 - 28.0 mmol/L  Acid-base deficit 5.9 (H) 0.0 - 2.0 mmol/L  O2 Saturation 97.2 %  Patient temperature 37.0   Collection site RIGHT RADIAL   Sample type ARTERIAL DRAW   Allens test (pass/fail) PASS PASS  Mechanical Rate 12  Glucose, capillary     Status: Abno rmal  Collection Time: 01/23/17 10:16 PM Result Value Ref Range  Glucose-Capillary 152 (H) 65 - 99 mg/dL Miscellaneous [facility] test (send-out)     Status: None  Collection Time: 01/24/17  2:50 AM Result Value Ref Range  [facility] test code [phone]   [facility] test  name [id number]   Source ([facility]) WHOLE BLOOD   Misc [facility] result COMMENT    Comment: (NOTE) Test Ordered: [id number] Hemoglobin A1c Hemoglobin A1c                 6.5          [H ] %        BN     Reference Range: 4.8-5.6                                       Prediabetes: 5.7 - 6.4         Diabetes: >6.4         Glycemic control for adults with diabetes: <7.0 Performed At: [facility] [place], [place] [id number] Rush Farmer MD [id number]  TSH  Status: None  Collection Time: 01/24/17  2:53 AM Result Value Ref Range  TSH 1.784 0.350 - 4.500 uIU/mL   Comment: Per formed by a 3rd Generation assay with a functional sensitivity of <=0.01 uIU/mL. CBC     Status: Abnormal  Collection Time: 01/24/17  2:53 AM Result Value Ref Range  WBC 7.2 3.8 - 10.6 K/uL  RBC 3.01 (L) 4.40 - 5.90 MIL/uL  Hemoglobin 9.1 (L) 13.0 - 18.0 g/dL  HCT 27.4 (L) 40.0 - 52.0 %  MCV 91.0 80.0 - 100.0 fL  MCH 30.2 26.0 - 34.0 pg  MCHC 33.2 32.0 - 36.0 g/dL  RDW 15.2 (H) 11.5 - 14.5 %  Platelets 178 150 - 440 K/uL Ba sic metabolic panel     Status: Abnormal  Collection Time: 01/24/17  2:53 AM Result Value Ref Range  Sodium 132 (L) 135 - 145 mmol/L  Potassium 3.9 3.5 - 5.1 mmol/L  Chloride 103 101 - 111 mmol/L  CO2 18 (L) 22 - 32 mmol/L  Glucose, Bld 153 (H) 65 - 99 mg/dL  BUN 58 (H) 6 - 20 mg/dL  Creatinine, Ser 5.43 (H) 0.61 - 1.24 mg/dL  Calcium 8.0 (L) 8.9 - 10.3 mg/dL  GFR calc non Af Amer 11 (L) >60 mL/min  GFR calc Af Amer 12 (L) > 60 mL/min   Comment: (NOTE) The eGFR has been calculated using the CKD EPI equation. This calculation has not been validated in all clinical situations. eGFR's persistently <60 mL/min signify possible Chronic Kidney Disease.   Anion gap 11 5 - 15 Lipid panel     Status: Abnormal  Collection Time: 01/24/17  2:53 AM Result Value Ref Range  Cholesterol 83 0 - 200 mg/dL  Triglycerides 160 (H) <150 mg/dL  HDL 26 (L) >40 mg/dL   Total  CHOL/HDL Ratio 3.2 RATIO  VLDL 32 0 - 40 mg/dL  LDL Cholesterol 25 0 - 99 mg/dL   Comment:        Total Cholesterol/HDL:CHD Risk Coronary Heart Disease Risk Table                     Men   Women  1/2 Average Risk   3.4   3.3  Average Risk       5.0   4.4  2 X Average Risk   9.6   7.1  3 X Average Risk  23.4   11.0        Use the calculated Patient Ratio above and the CHD Risk Table to determine the patient's CHD Risk .        ATP III CLASSIFICATION (LDL):  <100     mg/dL   Optimal  100-129  mg/dL   Near or Above                    Optimal  130-159  mg/dL   Borderline  160-189  mg/dL   High  >190     mg/dL   Very High  Troponin I     Status: Abnormal  Collection Time: 01/24/17  2:53 AM Result Value Ref Range  Troponin I 52.89 (HH) <0.03 ng/mL   Comment: CRITICAL VALUE NOTED. VALUE IS CONSISTENT WITH PREVIOUSLY REPORTED/CALLED VALUE.PMH  Glucose, capillary     Status: Abnormal  Collection Time: 01/24/17  7:31 AM Result Value Ref Range  Glucose-Capillary 132 (H) 65 - 99 mg/dL Troponin I     Status: Abnormal  Collection Time: 01/24/17  9:21 AM Result Value  Ref Range  Troponin I 36.73 (HH) <0.03 ng/mL   Comment: CRITICAL RESULT CALLED TO, READ BACK BY AND VERIFIED WITH [facility] OLEJAR AT 1006 01/24/17 DAS  Heparin level (unfractionated)     Status: Abnormal  Colle ction Time: 01/24/17  9:21 AM Result Value Ref Range  Heparin Unfractionated 0.22 (L) 0.30 - 0.70 IU/mL   Comment:        IF HEPARIN RESULTS ARE BELOW EXPECTED VALUES, AND PATIENT DOSAGE HAS BEEN CONFIRMED, SUGGEST FOLLOW UP TESTING OF ANTITHROMBIN III LEVELS.  Norovirus group 1 & 2 by PCR, stool     Status: None  Collection Time: 01/24/17  9:38 AM Result Value Ref Range  Norovirus 1 by PCR Negative Negative  Norovirus 2   by PCR Negative Negative   Comment: (NOTE) This test was developed and its performance characteristics determined by [facility].  It has not been cleared or approved by the Food and Drug  Administration.  The FDA has determined that such clearance or approval is not necessary. Performed At: [facility] [place], [place] [id number] Rush Farmer MD YN:[id number]  Glucose, capillary     Status: Abnormal  Col lection Time: 01/24/17 11:11 AM Result Value Ref Range  Glucose-Capillary 158 (H) 65 - 99 mg/dL Troponin I     Status: Abnormal  Collection Time: 01/24/17  3:02 PM Result Value Ref Range  Troponin I 30.22 (HH) <0.03 ng/mL   Comment: CRITICAL VALUE NOTED. VALUE IS CONSISTENT WITH PREVIOUSLY REPORTED/CALLED VALUE JJB Culture, blood (single) w Reflex to ID Panel     Status: None  Collection Time: 01/24/17  4:34 PM Result Value Re f Range  Specimen Description BLOOD BLOOD RIGHT HAND   Special Requests     BOTTLES DRAWN AEROBIC AND ANAEROBIC Blood Culture adequate volume  Culture NO GROWTH 5 DAYS   Report Status 01/29/2017 FINAL  Glucose, capillary     Status: Abnormal  Collection Time: 01/24/17  4:34 PM Result Value Ref Range  Glucose-Capillary 179 (H) 65 - 99 mg/dL  Comment 1 Document in Chart  Cath Tip Culture     Status: None (Preliminary result)   Collection Time: 01/24/17  4:42 PM Result Value Ref Range  Specimen Description CATH TIP   Special Requests NONE   Culture     >100,000 CFU STAPHYLOCOCCUS AUREUS SUSCEPTIBILITIES TO FOLLOW Performed at [facility], [place] [address], [place], [place] [address]   Report Status PENDING  Glucose, capillary     Status: Abnormal  Collection Time: 01/24/17  9:11 PM Result Value Ref Range  Glucose-Capillary 156 (H) 65 - 99 mg/ dL Gastrointestinal Panel by PCR , Stool     Status: Abnormal  Collection Time: 01/24/17  9:23 PM Result Value Ref Range  Campylobacter species NOT DETECTED NOT DETECTED  Plesimonas shigelloides NOT DETECTED NOT DETECTED  Salmonella species NOT DETECTED NOT DETECTED  Yersinia enterocolitica NOT DETECTED NOT DETECTED  Vibrio species NOT DETECTED NOT  DETECTED  Vibrio cholerae NOT DETECTED NOT DETECTED  Enteroaggregative E coli ( EAEC) NOT DETECTED NOT DETECTED  Enteropathogenic E coli (EPEC) DETECTED (A) NOT DETECTED   Comment: RESULT CALLED TO, READ BACK BY AND VERIFIED WITH: CHELSEA WRENN AT 0011 01/25/17 ALV   Enterotoxigenic E coli (ETEC) NOT DETECTED NOT DETECTED  Shiga like toxin producing E coli (STEC) NOT DETECTED NOT DETECTED  Shigella/Enteroinvasive E coli (EIEC) NOT DETECTED NOT DETECTED  Cryptosporidium NOT DETECTED NOT DETECTED  Cyclospora  cayetanensis NOT DETECTED NOT DETECTED  Entamoeba histolytica  NOT DETECTED NOT DETECTED  Giardia lamblia NOT DETECTED NOT DETECTED  Adenovirus F40/41 NOT DETECTED NOT DETECTED  Astrovirus NOT DETECTED NOT DETECTED  Norovirus GI/GII NOT DETECTED NOT DETECTED  Rotavirus A NOT DETECTED NOT DETECTED  Sapovirus (I, II, IV, and V) NOT DETECTED NOT DETECTED Troponin I     Status: Abnormal  Collection Time: 01/24/17 11:59 PM Result Va lue Ref Range  Troponin I 19.35 (HH) <0.03 ng/mL   Comment: CRITICAL VALUE NOTED. VALUE IS CONSISTENT WITH PREVIOUSLY REPORTED/CALLED VALUE ALV Heparin level (unfractionated)     Status: Abnormal  Collection Time: 01/24/17 11:59 PM Result Value Ref Range  Heparin Unfractionated 0.18 (L) 0.30 - 0.70 IU/mL   Comment:        IF HEPARIN RESULTS ARE BELOW EXPECTED VALUES, AND PATIENT DOSAGE HAS BEEN CONFIRMED, SUGGEST FOLLOW UP TE STING OF ANTITHROMBIN III LEVELS.  Basic metabolic panel     Status: Abnormal  Collection Time: 01/25/17  5:17 AM Result Value Ref Range  Sodium 129 (L) 135 - 145 mmol/L  Potassium 3.7 3.5 - 5.1 mmol/L  Chloride 101 101 - 111 mmol/L  CO2 17 (L) 22 - 32 mmol/L  Glucose, Bld 159 (H) 65 - 99 mg/dL  BUN 72 (H) 6 - 20 mg/dL  Creatinine, Ser 6.61 (H) 0.61 - 1.24 mg/dL  Calcium 8.0 (L) 8.9 - 10.3 mg/dL  GFR calc non Af Amer 8 (L)  >60 mL/min  GFR calc Af Amer 10 (L) >60 mL/min   Comment: (NOTE) The eGFR has been calculated using  the CKD EPI equation. This calculation has not been validated in all clinical situations. eGFR's persistently <60 mL/min signify possible Chronic Kidney Disease.   Anion gap 11 5 - 15 CBC     Status: Abnormal  Collection Time: 01/25/17  5:17 AM Result Value Ref Range  WBC 7.2 3.8 - 10.6 K/uL  RBC 2.93 (L) 4.40 - 5.90 MIL/uL   Hemoglobin 8.8 (L) 13.0 - 18.0 g/dL  HCT 26.4 (L) 40.0 - 52.0 %  MCV 90.3 80.0 - 100.0 fL  MCH 30.0 26.0 - 34.0 pg  MCHC 33.2 32.0 - 36.0 g/dL  RDW 15.3 (H) 11.5 - 14.5 %  Platelets 161 150 - 440 K/uL Magnesium     Status: None  Collection Time: 01/25/17  5:17 AM Result Value Ref Range  Magnesium 2.0 1.7 - 2.4 mg/dL Phosphorus     Status: Abnormal  Collection Time: 01/25/17  5:17 AM Result Value Ref Range  Phosphorus 5.3  (H) 2.5 - 4.6 mg/dL Heparin level (unfractionated)     Status: None  Collection Time: 01/25/17  5:17 AM Result Value Ref Range  Heparin Unfractionated 0.39 0.30 - 0.70 IU/mL   Comment:        IF HEPARIN RESULTS ARE BELOW EXPECTED VALUES, AND PATIENT DOSAGE HAS BEEN CONFIRMED, SUGGEST FOLLOW UP TESTING OF ANTITHROMBIN III LEVELS.  Glucose, capillary     Status: Abnormal  Collection Time: 01/25/17  7:51 AM Result Value Ref  Range  Glucose-Capillary 159 (H) 65 - 99 mg/dL Heparin level (unfractionated)     Status: Abnormal  Collection Time: 01/25/17 11:19 AM Result Value Ref Range  Heparin Unfractionated 0.20 (L) 0.30 - 0.70 IU/mL   Comment:        IF HEPARIN RESULTS ARE BELOW EXPECTED VALUES, AND PATIENT DOSAGE HAS BEEN CONFIRMED, SUGGEST FOLLOW UP TESTING OF ANTITHROMBIN III LEVELS.  ECHOCARDIOGRAM COMPLETE     Status: None  Collection Time:  01/25/17 12:05 PM Result Value Ref  Range  Weight 5,280.46 oz  Height 70 in  BP 104/65 mmHg Glucose, capillary     Status: Abnormal  Collection Time: 01/25/17 12:20 PM Result Value Ref Range  Glucose-Capillary 163 (H) 65 - 99 mg/dL Glucose, capillary     Status:  Abnormal  Collection Time: 01/25/17  4:37 PM Result Value Ref Range  Glucose-Capillary 139 (H) 65 - 99 mg/dL Glucose, capillary     Status: Abnormal  Collection Tim e: 01/25/17  5:07 PM Result Value Ref Range  Glucose-Capillary 137 (H) 65 - 99 mg/dL Glucose, capillary     Status: Abnormal  Collection Time: 01/25/17  9:13 PM Result Value Ref Range  Glucose-Capillary 135 (H) 65 - 99 mg/dL Heparin level (unfractionated)     Status: Abnormal  Collection Time: 01/26/17  1:50 AM Result Value Ref Range  Heparin Unfractionated 0.11 (L) 0.30 - 0.70 IU/mL   Comment:        IF HEPARIN RESULTS ARE  BELOW EXPECTED VALUES, AND PATIENT DOSAGE HAS BEEN CONFIRMED, SUGGEST FOLLOW UP TESTING OF ANTITHROMBIN III LEVELS.  Basic metabolic panel     Status: Abnormal  Collection Time: 01/26/17  3:57 AM Result Value Ref Range  Sodium 134 (L) 135 - 145 mmol/L  Potassium 3.1 (L) 3.5 - 5.1 mmol/L  Chloride 101 101 - 111 mmol/L  CO2 22 22 - 32 mmol/L  Glucose, Bld 144 (H) 65 - 99 mg/dL  BUN 38 (H) 6 - 20 mg/dL  Creatinine, Ser 4.42  (H) 0.61 - 1.24 mg/dL  Calcium 7.7 (L) 8.9 - 10.3 mg/dL  GFR calc non Af Amer 14 (L) >60 mL/min  GFR calc Af Amer 16 (L) >60 mL/min   Comment: (NOTE) The eGFR has been calculated using the CKD EPI equation. This calculation has not been validated in all clinical situations. eGFR's persistently <60 mL/min signify possible Chronic Kidney Disease.   Anion gap 11 5 - 15 CBC     Status: Abnormal  Collection Time: 01/26/17  3:57 A M Result Value Ref Range  WBC 6.4 3.8 - 10.6 K/uL  RBC 2.55 (L) 4.40 - 5.90 MIL/uL  Hemoglobin 7.6 (L) 13.0 - 18.0 g/dL  HCT 22.8 (L) 40.0 - 52.0 %  MCV 89.4 80.0 - 100.0 fL  MCH 29.8 26.0 - 34.0 pg  MCHC 33.3 32.0 - 36.0 g/dL  RDW 15.3 (H) 11.5 - 14.5 %  Platelets 137 (L) 150 - 440 K/uL Magnesium     Status: None  Collection Time: 01/26/17  3:57 AM Result Value Ref Range  Magnesium 1.8 1.7 - 2.4 mg/dL Phosphorus     Stat US:  None  Collection Time: 01/26/17  3:57 AM Result Value Ref Range  Phosphorus 4.4 2.5 - 4.6 mg/dL Glucose, capillary     Status: Abnormal  Collection Time: 01/26/17  7:26 AM Result Value Ref Range  Glucose-Capillary 132 (H) 65 - 99 mg/dL Heparin level (unfractionated)     Status: Abnormal  Collection Time: 01/26/17  8:30 AM Result Value Ref Range  Heparin Unfractionated 0.23 (L) 0.30 - 0.70 IU/mL   Comment:        IF HEPA RIN RESULTS ARE BELOW EXPECTED VALUES, AND PATIENT DOSAGE HAS BEEN CONFIRMED, SUGGEST FOLLOW UP TESTING OF ANTITHROMBIN III LEVELS.  Glucose, capillary     Status: Abnormal  Collection Time: 01/26/17 11:50 AM Result Value Ref Range  Glucose-Capillary 171 (H) 65 - 99 mg/dL Glucose, capillary     Status: Abnormal  Collection Time: 01/26/17  4:14 PM Result Value  Ref Range  Glucose-Capillary 183 (H) 65 - 99 mg/dL Heparin level  (unfractionated)     Status: Abnormal  Collection Time: 01/26/17  7:59 PM Result Value Ref Range  Heparin Unfractionated 0.24 (L) 0.30 - 0.70 IU/mL   Comment:        IF HEPARIN RESULTS ARE BELOW EXPECTED VALUES, AND PATIENT DOSAGE HAS BEEN CONFIRMED, SUGGEST FOLLOW UP TESTING OF ANTITHROMBIN III LEVELS.  Glucose, capillary     Status: Abnormal  Collection Time: 01/26/17  9:31 PM Result Value Ref Range  Glucose-Capillary 1 38 (H) 65 - 99 mg/dL Basic metabolic panel     Status: Abnormal  Collection Time: 01/27/17  5:16 AM Result Value Ref Range  Sodium 134 (L) 135 - 145 mmol/L  Potassium 3.1 (L) 3.5 - 5.1 mmol/L  Chloride 100 (L) 101 - 111 mmol/L  CO2 22 22 - 32 mmol/L  Glucose, Bld 134 (H) 65 - 99 mg/dL  BUN 45 (H) 6 - 20 mg/dL  Creatinine, Ser 5.53 (H) 0.61 - 1.24 mg/dL  Calcium 8.1 (L) 8.9 - 10.3 mg/dL  GFR calc non Af Amer 10 (L) >60 mL/min   GFR calc Af Amer 12 (L) >60 mL/min   Comment: (NOTE) The eGFR has been calculated using the CKD EPI equation. This calculation has not been validated in all clinical  situations. eGFR's persistently <60 mL/min signify possible Chronic Kidney Disease.   Anion gap 12 5 - 15 CBC     Status: Abnormal  Collection Time: 01/27/17  5:16 AM Result Value Ref Range  WBC 8.9 3.8 - 10.6 K/uL  RBC 2.84 (L) 4.40 - 5.90 MIL/uL  Hemoglobi n 8.5 (L) 13.0 - 18.0 g/dL  HCT 25.4 (L) 40.0 - 52.0 %  MCV 89.7 80.0 - 100.0 fL  MCH 29.8 26.0 - 34.0 pg  MCHC 33.2 32.0 - 36.0 g/dL  RDW 15.1 (H) 11.5 - 14.5 %  Platelets 157 150 - 440 K/uL Magnesium     Status: None  Collection Time: 01/27/17  5:16 AM Result Value Ref Range  Magnesium 1.9 1.7 - 2.4 mg/dL Phosphorus     Status: Abnormal  Collection Time: 01/27/17  5:16 AM Result Value Ref Range  Phosphorus 4.9 (H) 2.5 -  4.6 mg/dL Heparin level (unfractionated)     Status: Abnormal  Collection Time: 01/27/17  5:16 AM Result Value Ref Range  Heparin Unfractionated 0.25 (L) 0.30 - 0.70 IU/mL   Comment:        IF HEPARIN RESULTS ARE BELOW EXPECTED VALUES, AND PATIENT DOSAGE HAS BEEN CONFIRMED, SUGGEST FOLLOW UP TESTING OF ANTITHROMBIN III LEVELS.  Glucose, capillary     Status: Abnormal  Collection Time: 01/27/17  7:49 AM Result Value Ref Ran ge  Glucose-Capillary 138 (H) 65 - 99 mg/dL  Comment 1 Notify RN  Glucose, capillary     Status: Abnormal  Collection Time: 01/27/17 11:44 AM Result Value Ref Range  Glucose-Capillary 128 (H) 65 - 99 mg/dL  Comment 1 Notify RN  Heparin level (unfractionated)     Status: Abnormal  Collection Time: 01/27/17  2:11 PM Result Value Ref Range  Heparin Unfractionated 0.23 (L) 0.30 - 0.70 IU/mL   Comment:        IF HEPARIN RESULTS  ARE BELOW EXPECTED VALUES, AND PATIENT DOSAGE HAS BEEN CONFIRMED, SUGGEST FOLLOW UP TESTING OF ANTITHROMBIN III LEVELS.  Glucose, capillary     Status: Abnormal  Collection Time: 01/27/17  5:07 PM Result Value Ref Range  Glucose-Capillary 127 (H) 65 - 99  mg/dL Glucose, capillary     Status: Abnormal  Collection Time: 01/27/17  9:01  PM Result Value Ref Range  Glucose-Capillary 129 (H) 65 - 99 mg/dL Heparin level (unfraction ated)     Status: None  Collection Time: 01/28/17 12:21 AM Result Value Ref Range  Heparin Unfractionated 0.33 0.30 - 0.70 IU/mL   Comment:        IF HEPARIN RESULTS ARE BELOW EXPECTED VALUES, AND PATIENT DOSAGE HAS BEEN CONFIRMED, SUGGEST FOLLOW UP TESTING OF ANTITHROMBIN III LEVELS.  Antithrombin III     Status: Abnormal  Collection Time: 01/28/17 12:21 AM Result Value Ref Range  AntiThromb III Func 62 (L) 75 - 120 %    Comment: Performed at [facility] [address] [place], [place] [address] Magnesium     Status: None  Collection Time: 01/28/17  6:11 AM Result Value Ref Range  Magnesium 1.8 1.7 - 2.4 mg/dL Phosphorus     Status: None  Collection Time: 01/28/17  6:11 AM Result Value Ref Range  Phosphorus 3.6 2.5 - 4.6 mg/dL CBC     Status: Abnormal  Collection Time: 01/28/17  6:11 AM Result Value Ref Range  WBC 10.1 3.8 - 10.6 K/ uL  RBC 2.89 (L) 4.40 - 5.90 MIL/uL  Hemoglobin 8.6 (L) 13.0 - 18.0 g/dL  HCT 25.7 (L) 40.0 - 52.0 %  MCV 89.0 80.0 - 100.0 fL  MCH 29.7 26.0 - 34.0 pg  MCHC 33.4 32.0 - 36.0 g/dL  RDW 15.3 (H) 11.5 - 14.5 %  Platelets 190 150 - 440 K/uL Hepatitis B surface antigen     Status: None  Collection Time: 01/28/17  6:11 AM Result Value Ref Range  Hepatitis B Surface Ag Negative Negative   Comment: (NOTE) Performed At: [facility] [place], [place] [id number] Rush Farmer MD TD:[id number]  Hepatitis B surface antibody     Status: Abnormal  Collection Time: 01/28/17  6:11 AM Result Value Ref Range  Hepatitis B-Post 4.3 (L) Immunity>9.9 mIU/mL   Comment: (NOTE)  Status of Immunity                     Anti-HBs Level  ------------------                     -------------- Inconsistent with Immunity                   0.0 - 9.9 Con sistent with Immunity                          >9.9 Performed At: [facility] [place], [place] [id number] Rush Farmer MD OE:[id number]  Heparin level (unfractionated)     Status: None  Collection Time: 01/28/17  6:11 AM Result Value Ref Range  Heparin Unfractionated 0.38 0.30 - 0.70 IU/mL   Comment:        IF HEPARIN RESULTS ARE BELOW EXPECTED VALUES, AND PATIENT DOSAGE HAS BEEN CONFIRMED, SUGGEST  FOLLOW UP TESTING OF ANTITHROMBIN III LEVELS.  Glucose, capillary     Status: Abnormal  Collection Time: 01/28/17  8:23 AM Result Value Ref Range  Glucose-Capillary 129 (H) 65 - 99 mg/dL Glucose, capillary     Status: Abnormal  Collection Time: 01/28/17  9:01 AM Result Value Ref Range  Glucose-Capillary 134 (H) 65 - 99 mg/dL  Comment 1 Notify RN  Glucose, capillary     Status: Abnormal  Collection Time: 01/28/17 11:48 AM  Result Value Ref Range  Glucose-Capillary 123 (H) 65 - 99 mg/dL  Comment 1 Notify RN  Glucose, capillary     Status: Abnormal  Collection Time: 01/28/17  5:00 PM Result Value Ref Range  Glucose-Capillary 155 (H) 65 - 99 mg/dL  Comment 1 Notify RN  Glucose, capillary     Status: Abnormal  Collection Time: 01/28/17  6:10 PM Result Value Ref Range  Glucose-Capillary 134 (H) 65 - 99 mg/dL Glucose, capillary     Status: Abnormal   Collection Time: 01/28/17  9:51 PM Result Value Ref Range  Glucose-Capillary 173 (H) 65 - 99 mg/dL  Comment 1 Notify RN  Magnesium     Status: None  Collection Time: 01/29/17  4:49 AM Result Value Ref Range  Magnesium 1.9 1.7 - 2.4 mg/dL Phosphorus     Status: None  Collection Time: 01/29/17  4:49 AM Result Value Ref Range  Phosphorus 3.5 2.5 - 4.6 mg/dL Heparin level (unfractionated)     Status: Abnormal  Collection Time : 01/29/17  4:49 AM Result Value Ref Range  Heparin Unfractionated <0.10 (L) 0.30 - 0.70 IU/mL   Comment:        IF HEPARIN RESULTS ARE BELOW EXPECTED VALUES, AND PATIENT DOSAGE HAS BEEN CONFIRMED, SUGGEST FOLLOW UP TESTING OF ANTITHROMBIN III  LEVELS.  CBC     Status: Abnormal  Collection Time: 01/29/17  4:49 AM Result Value Ref Range  WBC 9.7 3.8 - 10.6 K/uL  RBC 2.93 (L) 4.40 - 5.90 MIL/uL  Hemoglobin 8.8 (L) 13.0 - 18. 0 g/dL  HCT 26.3 (L) 40.0 - 52.0 %  MCV 89.6 80.0 - 100.0 fL  MCH 30.0 26.0 - 34.0 pg  MCHC 33.5 32.0 - 36.0 g/dL  RDW 15.6 (H) 11.5 - 14.5 %  Platelets 268 150 - 440 K/uL Potassium     Status: Abnormal  Collection Time: 01/29/17  4:49 AM Result Value Ref Range  Potassium 3.2 (L) 3.5 - 5.1 mmol/L Glucose, capillary     Status: Abnormal  Collection Time: 01/29/17  7:55 AM Result Value Ref Range  Glucose-Capillary 138 (H) 65  - 99 mg/dL  Comment 1 Notify RN  Glucose, capillary     Status: Abnormal  Collection Time: 01/29/17  9:20 AM Result Value Ref Range  Glucose-Capillary 137 (H) 65 - 99 mg/dL Glucose, capillary     Status: Abnormal  Collection Time: 01/29/17  3:46 PM Result Value Ref Range  Glucose-Capillary 115 (H) 65 - 99 mg/dL  Comment 1 Notify RN  Glucose, capillary     Status: Abnormal  Collection Time: 01/29/17  5:47 PM Result Value Re f Range  Glucose-Capillary 123 (H) 65 - 99 mg/dL  Comment 1 Notify RN  Glucose, capillary     Status: Abnormal  Collection Time: 01/29/17  8:59 PM Result Value Ref Range  Glucose-Capillary 129 (H) 65 - 99 mg/dL Magnesium     Status: None  Collection Time: 01/30/17  3:49 AM Result Value Ref Range  Magnesium 1.8 1.7 - 2.4 mg/dL Phosphorus     Status: Abnormal  Collection Time: 01/30/17  3:49 AM Result Value Ref Range  Phosph orus 2.4 (L) 2.5 - 4.6 mg/dL Glucose, capillary     Status: Abnormal  Collection Time: 01/30/17  7:59 AM Result Value Ref Range  Glucose-Capillary 133 (H) 65 - 99 mg/dL Glucose, capillary     Status: Abnormal  Collection Time: 01/30/17 12:24 PM Result Value Ref Range  Glucose-Capillary 161 (H) 65 -  99 mg/dL Glucose, capillary     Status: Abnormal  Collection Time: 01/30/17  4:54 PM Result Value Ref  Range  Glucose-Capillary 1 64 (H) 65 - 99 mg/dL  Comment 1 Notify RN  Glucose, capillary     Status: Abnormal  Collection Time: 01/30/17  9:22 PM Result Value Ref Range  Glucose-Capillary 136 (H) 65 - 99 mg/dL Magnesium     Status: None  Collection Time: 01/31/17  3:30 AM Result Value Ref Range  Magnesium 1.9 1.7 - 2.4 mg/dL Phosphorus     Status: None  Collection Time: 01/31/17  3:30 AM Result Value Ref Range  Phosphorus 3.6 2.5 - 4.6 mg/dL Basic m etabolic panel     Status: Abnormal  Collection Time: 01/31/17  3:30 AM Result Value Ref Range  Sodium 138 135 - 145 mmol/L  Potassium 3.3 (L) 3.5 - 5.1 mmol/L  Chloride 101 101 - 111 mmol/L  CO2 26 22 - 32 mmol/L  Glucose, Bld 130 (H) 65 - 99 mg/dL  BUN 23 (H) 6 - 20 mg/dL  Creatinine, Ser 4.34 (H) 0.61 - 1.24 mg/dL  Calcium 8.6 (L) 8.9 - 10.3 mg/dL  GFR calc non Af Amer 14 (L) >60 mL/min  GFR calc Af Amer 16 (L) >60 mL/min    Comment: (NOTE) The eGFR has been calculated using the CKD EPI equation. This calculation has not been validated in all clinical situations. eGFR's persistently <60 mL/min signify possible Chronic Kidney Disease.   Anion gap 11 5 - 15 Glucose, capillary     Status: Abnormal  Collection Time: 01/31/17  7:40 AM Result Value Ref Range  Glucose-Capillary 130 (H) 65 - 99 mg/dL Glucose, capillary     Status: Abnormal  Collecti on Time: 01/31/17 11:43 AM Result Value Ref Range  Glucose-Capillary 129 (H) 65 - 99 mg/dL Glucose, capillary     Status: Abnormal  Collection Time: 01/31/17 12:53 PM Result Value Ref Range  Glucose-Capillary 129 (H) 65 - 99 mg/dL CBC     Status: Abnormal  Collection Time: 02/08/17 11:14 PM Result Value Ref Range  WBC 13.6 (H) 3.8 - 10.6 K/uL  RBC 3.19 (L) 4.40 - 5.90 MIL/uL  Hemoglobin 9.3 (L) 13.0 - 18.0 g/dL  HCT 29.2 (L ) 40.0 - 52.0 %  MCV 91.3 80.0 - 100.0 fL  MCH 29.2 26.0 - 34.0 pg  MCHC 31.9 (L) 32.0 - 36.0 g/dL  RDW 15.9 (H) 11.5 - 14.5 %  Platelets 280 150 - 440  K/uL Basic metabolic panel     Status: Abnormal  Collection Time: 02/08/17 11:14 PM Result Value Ref Range  Sodium 141 135 - 145 mmol/L  Potassium 3.9 3.5 - 5.1 mmol/L  Chloride 101 101 - 111 mmol/L  CO2 28 22 - 32 mmol/L  Glucose, Bld 158 (H) 65 - 99 mg/dL  BUN 39 (H) 6 -  20 mg/dL  Creatinine, Ser 4.30 (H) 0.61 - 1.24 mg/dL  Calcium 8.4 (L) 8.9 - 10.3 mg/dL  GFR calc non Af Amer 14 (L) >60 mL/min  GFR calc Af Amer 16 (L) >60 mL/min   Comment: (NOTE) The eGFR has been calculated using the CKD EPI equation. This calculation has not been validated in all clinical situations. eGFR's persistently <60 mL/min signify possible Chronic Kidney Disease.   Anion gap 12 5 - 15 Troponin I     Status: Abnor mal  Collection Time: 02/08/17 11:14 PM Result Value Ref Range  Troponin I 0.04 (HH) <0.03 ng/mL   Comment: CRITICAL RESULT  CALLED TO, READ BACK BY AND VERIFIED WITH HENRY RIVERA ON 02/09/17 AT 0003 JAG  Brain natriuretic peptide     Status: Abnormal  Collection Time: 02/08/17 11:14 PM Result Value Ref Range  B Natriuretic Peptide 1,783.0 (H) 0.0 - 100.0 pg/mL Glucose, capillary     Status: Abnormal  Collection Time: 12/19/1 8  3:04 AM Result Value Ref Range  Glucose-Capillary 164 (H) 65 - 99 mg/dL Basic metabolic panel     Status: Abnormal  Collection Time: 02/09/17  5:47 AM Result Value Ref Range  Sodium 140 135 - 145 mmol/L  Potassium 4.4 3.5 - 5.1 mmol/L  Chloride 105 101 - 111 mmol/L  CO2 26 22 - 32 mmol/L  Glucose, Bld 172 (H) 65 - 99 mg/dL  BUN 41 (H) 6 - 20 mg/dL  Creatinine, Ser 4.50 (H) 0.61 - 1.24 mg/dL  Calcium 8.4 (L) 8.9 - 10.3 mg /dL  GFR calc non Af Amer 13 (L) >60 mL/min  GFR calc Af Amer 15 (L) >60 mL/min   Comment: (NOTE) The eGFR has been calculated using the CKD EPI equation. This calculation has not been validated in all clinical situations. eGFR's persistently <60 mL/min signify possible Chronic Kidney Disease.   Anion gap 9 5 - 15 CBC     Status:  Abnormal  Collection Time: 02/09/17  5:47 AM Result Value Ref Range  WBC 15.7 (H) 3.8 - 10.6 K/ uL  RBC 3.00 (L) 4.40 - 5.90 MIL/uL  Hemoglobin 8.7 (L) 13.0 - 18.0 g/dL  HCT 27.8 (L) 40.0 - 52.0 %  MCV 92.7 80.0 - 100.0 fL  MCH 29.0 26.0 - 34.0 pg  MCHC 31.3 (L) 32.0 - 36.0 g/dL  RDW 16.6 (H) 11.5 - 14.5 %  Platelets 266 150 - 440 K/uL Glucose, capillary     Status: Abnormal  Collection Time: 02/09/17  7:44 AM Result Value Ref Range  Glucose-Capillary 151 (H) 65 - 99 mg/dL  Comment 1 Notify RN  MRSA PCR Screening      Status: None  Collection Time: 02/09/17  9:20 PM Result Value Ref Range  MRSA by PCR NEGATIVE NEGATIVE   Comment:        The GeneXpert MRSA Assay (FDA approved for NASAL specimens only), is one component of a comprehensive MRSA colonization surveillance program. It is not intended to diagnose MRSA infection nor to guide or monitor treatment for MRSA infections.  Glucose, capillary     Status: None  Collection Time: 12/1 9/18  9:24 PM Result Value Ref Range  Glucose-Capillary 97 65 - 99 mg/dL  Comment 1 Notify RN  Basic metabolic panel     Status: Abnormal  Collection Time: 02/10/17  4:06 AM Result Value Ref Range  Sodium 140 135 - 145 mmol/L  Potassium 3.7 3.5 - 5.1 mmol/L  Chloride 103 101 - 111 mmol/L  CO2 28 22 - 32 mmol/L  Glucose, Bld 80 65 - 99 mg/dL  BUN 21 (H) 6 - 20 mg/dL  Creatinine, Ser 3.39 (H) 0.61 - 1.24 mg/dL  Calcium 8.2 ( L) 8.9 - 10.3 mg/dL  GFR calc non Af Amer 19 (L) >60 mL/min  GFR calc Af Amer 22 (L) >60 mL/min   Comment: (NOTE) The eGFR has been calculated using the CKD EPI equation. This calculation has not been validated in all clinical situations. eGFR's persistently <60 mL/min signify possible Chronic Kidney Disease.   Anion gap 9 5 - 15 Glucose, capillary     Status: None  Collection Time: 02/10/17  7:27 AM Result Value Ref Range   Glucose-Capillary 92 65 - 99 mg/dL   Assessment/Plan:  HLD  (hyperlipidemia) lipid control important in reducing the progression of atherosclerotic disease. Continue statin therapy   DM (diabetes mellitus) type II controlled with renal manifestation (HCC) blood glucose control important in reducing the progression of atherosclerotic disease. Also, involved in wound healing. On appropriate medications.   HTN (hypertension)  Likely an underlying cause of renal failure. blood pressure control important in reducing the progression of atherosclerotic disease. On appropriate oral medications.       Leotis Pain 02/10/2017, 10:38 AM   This note was created with Dragon medical transcription system.  Any errors from dictation are unintentional.

## 2017-02-10 NOTE — Progress Notes (Signed)
HD Post Tx

## 2017-02-10 NOTE — Progress Notes (Signed)
Patient is refusing the bed alarm.  I spoke with him about the importance of not getting up on his own.  He said he does not plan to get up on his own, he just doesn't like it when the alarm goes off when he moves in the bed

## 2017-02-10 NOTE — Care Management (Signed)
RNCM met briefly with patient during dialysis. He would like to talk with someone about a ramp to get into his home. Per Brenton Grills with [facility] it will cost ~$45/month to rent a ramp. Patient agrees with this. I have notified Jermaine and he has referred me to [facility] store. I have left message with patient wife. I have also shared contact for [facility] as they have in the past helped remodel home to help patients prosper- including building a ramp.  Patient will need EMS to home.

## 2017-02-10 NOTE — Progress Notes (Signed)
Patient has redness in his groin.  He was lying in wet/soiled underwear.  Patient did let me remove his underwear and offer peri care.  Order received from Dr Benjie Karvonen for nystatin

## 2017-02-10 NOTE — Assessment & Plan Note (Deleted)
lipid control important in reducing the progression of atherosclerotic disease. Continue statin therapy  

## 2017-02-10 NOTE — Progress Notes (Signed)
[facility]  ROUNDING NOTE   Subjective:   Moved to ICU due to hypoxia. BIPAP overnight. More alert and oriented this morning.   Hemodialysis treatment yesterday. Hypoxic and hypotensive during treatment. Given IV albumin. UF of 2.5 liters  Objective:  Vital signs in last 24 hours:  Temp:  [97.8 F (36.6 C)-99.6 F (37.6 C)] 97.8 F (36.6 C) (12/20 1030) Pulse Rate:  [56-81] 79 (12/20 1045) Resp:  [11-38] 16 (12/20 1045) BP: (80-135)/(40-105) 126/68 (12/20 1045) SpO2:  [75 %-100 %] 99 % (12/20 1045) FiO2 (%):  [40 %] 40 % (12/20 0000) Weight:  [147 kg (324 lb 1.2 oz)-153 kg (337 lb 4.9 oz)] 147 kg (324 lb 1.2 oz) (12/20 0403)  Weight change: 11.5 kg (25 lb 4.9 oz) Filed Weights   02/09/17 2015 02/09/17 2124 02/10/17 0403  Weight: (!) 149.9 kg (330 lb 7.5 oz) (!) 147 kg (324 lb 1.2 oz) (!) 147 kg (324 lb 1.2 oz)    Intake/Output: I/O last 3 completed shifts: In: 0  Out: 2631 [Other:2631]   Intake/Output this shift:  Total I/O In: 237 [P.O.:237] Out: 0   Physical Exam: General: NAD  Head: +BIPAP  Eyes: Anicteric, PERRL  Neck: Supple, trachea midline  Lungs:  Bilateral crackles  Heart: Regular rate and rhythm  Abdomen:  Soft, nontender,   Extremities:  ++ peripheral edema.  Neurologic: Nonfocal, moving all four extremities  Skin: No lesions  Access: permcath    Basic Metabolic Panel: Recent Labs  Lab 02/08/17 2314 02/09/17 0547 02/10/17 0406  NA 141 140 140  K 3.9 4.4 3.7  CL 101 105 103  CO2 28 26 28   GLUCOSE 158* 172* 80  BUN 39* 41* 21*  CREATININE 4.30* 4.50* 3.39*  CALCIUM 8.4* 8.4* 8.2*    Liver Function Tests: No results for input(s): AST, ALT, ALKPHOS, BILITOT, PROT, ALBUMIN in the last 168 hours. No results for input(s): LIPASE, AMYLASE in the last 168 hours. No results for input(s): AMMONIA in the last 168 hours.  CBC: Recent Labs  Lab 02/08/17 2314 02/09/17 0547  WBC 13.6* 15.7*  HGB 9.3* 8.7*  HCT 29.2* 27.8*  MCV  91.3 92.7  PLT 280 266    Cardiac Enzymes: Recent Labs  Lab 02/08/17 2314  TROPONINI 0.04*    BNP: Invalid input(s): POCBNP  CBG: Recent Labs  Lab 02/09/17 0304 02/09/17 0744 02/09/17 2124 02/10/17 0727  GLUCAP 164* 151* 97 26    Microbiology: Results for orders placed or performed during the hospital encounter of 02/08/17  MRSA PCR Screening     Status: None   Collection Time: 02/09/17  9:20 PM  Result Value Ref Range Status   MRSA by PCR NEGATIVE NEGATIVE Final    Comment:        The GeneXpert MRSA Assay (FDA approved for NASAL specimens only), is one component of a comprehensive MRSA colonization surveillance program. It is not intended to diagnose MRSA infection nor to guide or monitor treatment for MRSA infections.     Coagulation Studies: No results for input(s): LABPROT, INR in the last 72 hours.  Urinalysis: No results for input(s): COLORURINE, LABSPEC, PHURINE, GLUCOSEU, HGBUR, BILIRUBINUR, KETONESUR, PROTEINUR, UROBILINOGEN, NITRITE, LEUKOCYTESUR in the last 72 hours.  Invalid input(s): APPERANCEUR    Imaging: Dg Chest Port 1 View  Result Date: 02/10/2017 CLINICAL DATA:  Followup pulmonary edema EXAM: PORTABLE CHEST 1 VIEW COMPARISON:  02/08/2017 FINDINGS: Central line unchanged with its tip at the SVC RA junction. Slightly last diffuse  edema. Mild persistent basilar atelectasis. IMPRESSION: Radiographic improvement with slightly less pulmonary edema. Electronically Signed   By: Nelson Chimes M.D.   On: 02/10/2017 06:38   Dg Chest Portable 1 View  Result Date: 02/08/2017 CLINICAL DATA:  Shortness of breath. Symptom onset after dialysis today. EXAM: PORTABLE CHEST 1 VIEW COMPARISON:  01/25/2017 FINDINGS: Left internal jugular dialysis catheter tip in the distal SVC. Prior central line tip in removed. The heart is enlarged. Increased pulmonary edema from prior exam. Possible small left pleural effusion versus soft tissue attenuation from habitus. No  pneumothorax. IMPRESSION: Cardiomegaly with increased pulmonary edema from prior exam. Possible left pleural effusion. Electronically Signed   By: Jeb Levering M.D.   On: 02/08/2017 23:37     Medications:   . albumin human     . aspirin EC  81 mg Oral Daily  . atorvastatin  80 mg Oral Daily  . epoetin (EPOGEN/PROCRIT) injection  10,000 Units Intravenous Q T,Th,Sa-HD  . heparin  5,000 Units Subcutaneous Q8H  . insulin aspart  0-5 Units Subcutaneous QHS  . insulin aspart  0-9 Units Subcutaneous TID WC  . levothyroxine  50 mcg Oral QAC breakfast  . mouth rinse  15 mL Mouth Rinse BID  . sertraline  25 mg Oral Daily   acetaminophen **OR** acetaminophen, ondansetron **OR** ondansetron (ZOFRAN) IV, oxyCODONE  Assessment/ Plan:  [name] is a 56 y.o. white male with end stage renal disease on hemodialysis,diabetes mellitus type II insulin dependent, diabetic peripheral neuropathy, right toe amputation, hypertension, hyperlipidemia, COPD/tobacco use, depression, coronary artery disease  TTS [facility] permcath  1. ESRD on HD TTHS: volume overload and respiratory distress. EDW of 146kg. Patient is currently 147kg but exam is consistent with anasarca.  Hemodialysis today. UF goal of 5 liters. Monitor daily for dialysis need.   2. Respiratory Failure: requiring noninvasive ventilation overnight. History of obstructive sleep apnea  - Attempt high volume ultrafiltration as above.   3. Bacteremia: MSSA on 01/23/17. Cath exchanged Vein mapping done on 01/03/17.  - cefazolin 2g IV with HD treatment until 12/26. AVF placement after that.   4. Hypertension: hypotensive currently. Holding carvedilol.   5. Anemia of chronic kidney disease: hemoglobin 8.7 - epo ordered for TTS HD treatments.   6. Secondary Hyperparathyroidism: not currently on binders.     LOS: [place], [place] 12/20/201811:14 AM

## 2017-02-10 NOTE — Progress Notes (Signed)
PT Cancellation Note  Patient Details Name: Brian [name] MRN: [id number] DOB: 19-Apr-1960   Cancelled Treatment:    Reason Eval/Treat Not Completed: Patient declined, no reason specified Pt laying in bed, stating that he knows it's important to start doing some activity but he is exhausted, so much so that he can "barely function" he states he is very willing to work with PT tomorrow.  Will try back then.  Kreg Shropshire, DPT 02/10/2017, 4:43 PM

## 2017-02-10 NOTE — Progress Notes (Signed)
Post HD assessment  

## 2017-02-11 ENCOUNTER — Encounter (INDEPENDENT_AMBULATORY_CARE_PROVIDER_SITE_OTHER): Payer: Medicaid Other

## 2017-02-11 ENCOUNTER — Encounter (INDEPENDENT_AMBULATORY_CARE_PROVIDER_SITE_OTHER): Payer: Medicaid Other | Admitting: Vascular Surgery

## 2017-02-11 LAB — GLUCOSE, CAPILLARY
GLUCOSE-CAPILLARY: 150 mg/dL — AB (ref 65–99)
Glucose-Capillary: 98 mg/dL (ref 65–99)

## 2017-02-11 LAB — HEPATITIS B CORE ANTIBODY, TOTAL: Hep B Core Total Ab: NEGATIVE

## 2017-02-11 LAB — HEPATITIS B SURFACE ANTIBODY,QUALITATIVE: HEP B S AB: NONREACTIVE

## 2017-02-11 LAB — HEPATITIS B SURFACE ANTIGEN: Hepatitis B Surface Ag: NEGATIVE

## 2017-02-11 MED ORDER — CARVEDILOL 3.125 MG PO TABS: 3.1250 mg | ORAL_TABLET | Freq: Two times a day (BID) | ORAL | 0 refills | Status: DC

## 2017-02-11 NOTE — Evaluation (Signed)
Physical Therapy Evaluation Patient Details Name: Brian [name] MRN: [id number] DOB: [birth year] Today's Date: 02/11/2017   History of Present Illness  Pt with multiple recent hospitaliazations and and had been generally feeling weaker for the last 1 month.  Pt has had multiple falls but has been able to manage dialysis 3X/week with great effort.   Clinical Impression  Pt struggled with mobility and especially had a difficult time standing from standard height bed.  Pt needed considerable assist to assume standing on 2 attempts and when we tried to ambulate he was very unsteady, had multiple bouts of knee buckling and rightfully did not feel safe to be able to get any distance from bed to sit back down quickly.  Overall pt is weak, unsteady and agrees that he is not safe to go home.  PT recommending STR.    Follow Up Recommendations SNF    Equipment Recommendations  None recommended by PT    Recommendations for Other Services       Precautions / Restrictions Precautions Precautions: Fall Restrictions Weight Bearing Restrictions: No      Mobility  Bed Mobility Overal bed mobility: Modified Independent Bed Mobility: Supine to Sit     Supine to sit: HOB elevated;Modified independent (Device/Increase time)     General bed mobility comments: Increased time and effort with use of bed rail.  Close min guard as pt demonstrates instability as he nears EOB.   Transfers Overall transfer level: Needs assistance Equipment used: Rolling walker (2 wheeled) Transfers: Sit to/from Stand Sit to Stand: Mod assist         General transfer comment: Pt made multiple attempts to stand w/o assist at EOB.  He was unable to even lift hip and on both attempts at standing needed a lot of cuing for foot placement/set up and still needed considerable assist just to keep weight forward/stay upright.  Ultimately he was not confident and showed poor safety awareness.    Ambulation/Gait Ambulation/Gait assistance: Mod assist;Min assist Ambulation Distance (Feet): 5 Feet Assistive device: Rolling walker (2 wheeled)       General Gait Details: 2 attempts at walking with poor results.  Pt heavily relaint on the walker but had multiple knee buckling episodes and generally showed poor confidence and safety.  Pt was not safe/confident enough to walk more than just a few feet from the bed.    Stairs            Wheelchair Mobility    Modified Rankin (Stroke Patients Only)       Balance Overall balance assessment: Needs assistance;History of Falls Sitting-balance support: Feet supported;Single extremity supported Sitting balance-Leahy Scale: Good     Standing balance support: Bilateral upper extremity supported Standing balance-Leahy Scale: Poor Standing balance comment: Pt heavily reliant on the walker and multiple knee buckling episodes                              Pertinent Vitals/Pain Pain Assessment: (chronic LBP, not rated)    Home Living Family/patient expects to be discharged to:: Private residence Living Arrangements: Spouse/significant other Available Help at Discharge: Family;Available 24 hours/day Type of Home: Mobile home Home Access: Stairs to enter Entrance Stairs-Rails: Left;Right;Can reach both Entrance Stairs-Number of Steps: 6 Home Layout: One level Home Equipment: Walker - 2 wheels;Shower seat;Grab bars - tub/shower;Hand held shower head;Cane - single point;Transport chair      Prior Function Level of Independence: Needs assistance  Gait / Transfers Assistance Needed: Pt has been using walker more than cane recently secondary   ADL's / Homemaking Assistance Needed: Needs min assist for bathing, dressing.          Hand Dominance        Extremity/Trunk Assessment   Upper Extremity Assessment Upper Extremity Assessment: Generalized weakness    Lower Extremity Assessment Lower Extremity  Assessment: Generalized weakness(Pt with poor quad control, pain in b/l knees)       Communication   Communication: No difficulties  Cognition Arousal/Alertness: Awake/alert Behavior During Therapy: WFL for tasks assessed/performed Overall Cognitive Status: Within Functional Limits for tasks assessed                                        General Comments      Exercises     Assessment/Plan    PT Assessment Patient needs continued PT services  PT Problem List Decreased strength;Decreased activity tolerance;Decreased balance;Decreased mobility;Decreased knowledge of use of DME;Decreased safety awareness;Cardiopulmonary status limiting activity;Obesity       PT Treatment Interventions DME instruction;Gait training;Stair training;Functional mobility training;Therapeutic activities;Therapeutic exercise;Balance training;Neuromuscular re-education;Patient/family education;Wheelchair mobility training    PT Goals (Current goals can be found in the Care Plan section)  Acute Rehab PT Goals Patient Stated Goal: get stronger PT Goal Formulation: With patient Time For Goal Achievement: 02/25/17 Potential to Achieve Goals: Fair    Frequency Min 2X/week   Barriers to discharge        Co-evaluation               AM-PAC PT "6 Clicks" Daily Activity  Outcome Measure Difficulty turning over in bed (including adjusting bedclothes, sheets and blankets)?: A Little Difficulty moving from lying on back to sitting on the side of the bed? : A Lot Difficulty sitting down on and standing up from a chair with arms (e.g., wheelchair, bedside commode, etc,.)?: Unable Help needed moving to and from a bed to chair (including a wheelchair)?: A Lot Help needed walking in hospital room?: A Lot Help needed climbing 3-5 steps with a railing? : Total 6 Click Score: 11    End of Session Equipment Utilized During Treatment: Gait belt Activity Tolerance: Patient tolerated treatment  well;Patient limited by fatigue Patient left: in bed;with call bell/phone within reach   PT Visit Diagnosis: Muscle weakness (generalized) (M62.81);History of falling (Z91.81);Repeated falls (R29.6);Other abnormalities of gait and mobility (R26.89);Unsteadiness on feet (R26.81)    Time: 1517-6160 PT Time Calculation (min) (ACUTE ONLY): 31 min   Charges:   PT Evaluation $PT Eval Low Complexity: 1 Low PT Treatments $Therapeutic Activity: 8-22 mins   PT G Codes:   PT G-Codes **NOT FOR INPATIENT CLASS** Functional Assessment Tool Used: AM-PAC 6 Clicks Basic Mobility;Clinical judgement Functional Limitation: Mobility: Walking and moving around Mobility: Walking and Moving Around Current Status (V3710): At least 60 percent but less than 80 percent impaired, limited or restricted Mobility: Walking and Moving Around Goal Status [id number]): At least 20 percent but less than 40 percent impaired, limited or restricted    Kreg Shropshire, DPT 02/11/2017, 10:57 AM

## 2017-02-11 NOTE — Progress Notes (Signed)
SATURATION QUALIFICATIONS: (This note is used to comply with regulatory documentation for home oxygen)  Patient Saturations on Room Air at Rest = 90%  Patient Saturations on Room Air while Ambulating = 85%  Patient Saturations on 3 Liters of oxygen while Ambulating = 93%  Please briefly explain why patient needs home oxygen: Pt quickly became SOB during ambulation on RA and was unable to walk but a short distance. Pts o2 sats quickly returned to normal limits once O2 placed and pt reported a decrease in SOB.

## 2017-02-11 NOTE — Progress Notes (Signed)
Occupational Therapy Treatment Patient Details Name: Brian [name] MRN: [id number] DOB: 1960/10/09 Today's Date: 02/11/2017    History of present illness Pt. is a 56 y.o. male who was admitted to [facility] with weakness, and fluid overload. Pt. is on 3L o2, and has started dialysis 3x's a week.   OT comments  Pt. Presents with weakness, decreased activity tolerance, and impaired functional mobility. Pt. Is on 3L02 at home, is on dialysis for 3x's a week. Pt. Resides at home with his wife in a mobile home. Pt./ required minA for morning ADL care. Pt. Was modified independent with IADL tasks, and independent with medication management. Pt. Is in the process of preparing for discharge home today with EMS. Education was provided about A/E use, energy conservation, and work simplification techniques. Pt. was provided with a visual handout about energy conservation techniques, and A/E use. Pt. Could benefit fron skilled OT services for ADL training, A/E training, and pt. Education about home modification energy conservation, and DME. Pt. would benefit from SNF level of care upon discharge, however is planning to return home with EMS today.   Follow Up Recommendations  SNF    Equipment Recommendations       Recommendations for Other Services      Precautions / Restrictions Precautions Precautions: Fall Restrictions Weight Bearing Restrictions: No              ADL either performed or assessed with clinical judgement   ADL Overall ADL's : Needs assistance/impaired Eating/Feeding: Set up   Grooming: Set up   Upper Body Bathing: Minimal assistance   Lower Body Bathing: Maximal assistance;Moderate assistance   Upper Body Dressing : Minimal assistance   Lower Body Dressing: Moderate assistance;Maximal assistance               Functional mobility during ADLs: Maximal assistance;Moderate assistance       Vision Patient Visual Report: No change from baseline     Perception      Praxis      Cognition Arousal/Alertness: Awake/alert Behavior During Therapy: WFL for tasks assessed/performed Overall Cognitive Status: Within Functional Limits for tasks assessed                                          Exercises     Shoulder Instructions       General Comments      Pertinent Vitals/ Pain       Pain Assessment: 0-10 Pain Score: 8  Pain Location: Chronic back and Bil knee pain Pain Descriptors / Indicators: Discomfort;Grimacing Pain Intervention(s): Limited activity within patient's tolerance;Monitored during session;Repositioned  Home Living Family/patient expects to be discharged to:: Private residence Living Arrangements: Spouse/significant other Available Help at Discharge: Family;Available 24 hours/day Type of Home: Mobile home Home Access: Stairs to enter Entrance Stairs-Number of Steps: 6 Entrance Stairs-Rails: Right;Left;Can reach both Home Layout: One level     Bathroom Shower/Tub: Occupational psychologist: Handicapped height Bathroom Accessibility: Yes   Home Equipment: Environmental consultant - 2 wheels;Shower seat;Grab bars - tub/shower;Hand held shower head;Cane - single point;Transport chair          Prior Functioning/Environment Level of Independence: Needs assistance  Gait / Transfers Assistance Needed: Pt. uses a walker. ADL's / Homemaking Assistance Needed: Assistance was required for morning care secondary to Hx of low back pain. Independent with medication management, light meal prep.  Frequency  Min 2X/week        Progress Toward Goals  OT Goals(current goals can now be found in the care plan section)     Acute Rehab OT Goals Patient Stated Goal: get stronger  Plan      Co-evaluation                 AM-PAC PT "6 Clicks" Daily Activity     Outcome Measure   Help from another person eating meals?: None Help from another person taking care of personal grooming?: None Help from  another person toileting, which includes using toliet, bedpan, or urinal?: A Lot Help from another person bathing (including washing, rinsing, drying)?: A Lot Help from another person to put on and taking off regular upper body clothing?: A Little Help from another person to put on and taking off regular lower body clothing?: A Lot 6 Click Score: 17    End of Session    OT Visit Diagnosis: Muscle weakness (generalized) (M62.81)   Activity Tolerance Patient tolerated treatment well   Patient Left in bed;with bed alarm set;with call bell/phone within reach   Nurse Communication      Functional Assessment Tool Used: AM-PAC 6 Clicks Daily Activity Functional Limitation: Self care Self Care Current Status (T6226): At least 60 percent but less than 80 percent impaired, limited or restricted Self Care Goal Status (J3354): At least 1 percent but less than 20 percent impaired, limited or restricted   Time: 1135-1201 OT Time Calculation (min): 26 min  Charges: OT G-codes **NOT FOR INPATIENT CLASS** Functional Assessment Tool Used: AM-PAC 6 Clicks Daily Activity Functional Limitation: Self care Self Care Current Status (T6256): At least 60 percent but less than 80 percent impaired, limited or restricted Self Care Goal Status (L8937): At least 1 percent but less than 20 percent impaired, limited or restricted OT General Charges $OT Visit: 1 Visit OT Evaluation $OT Eval Moderate Complexity: 1 Mod  Harrel Carina, MS, OTR/L   Harrel Carina, MS, OTR/L 02/11/2017, 12:40 PM

## 2017-02-11 NOTE — Progress Notes (Signed)
Pt. refused to have bed alarmed on. Primary nurse notified.

## 2017-02-11 NOTE — Discharge Summary (Signed)
[facility] NAME: Brian [name]    MR#:  [id number]  DATE OF BIRTH:  June 03, 1960  DATE OF ADMISSION:  02/08/2017 ADMITTING PHYSICIAN: Lance Coon, MD  DATE OF DISCHARGE: 02/11/2017  PRIMARY CARE PHYSICIAN: Lorelee Market, MD    ADMISSION DIAGNOSIS:  Shortness of breath [R06.02] Acute pulmonary edema ([place]) [J81.0] Pleural effusion [J90]  DISCHARGE DIAGNOSIS:  Principal Problem:   Fluid overload Active Problems:   HTN (hypertension)   Diabetes mellitus (HCC)   COPD (chronic obstructive pulmonary disease) (HCC)   Obstructive sleep apnea   Acute on chronic diastolic CHF (congestive heart failure) ([place])   ESRD on dialysis ([place])   SECONDARY DIAGNOSIS:   Past Medical History:  Diagnosis Date  . CHF (congestive heart failure) ([place])   . COPD (chronic obstructive pulmonary disease) ([place])   . Diabetes mellitus type 2 in obese ([place])   . DM (diabetes mellitus) type II controlled with renal manifestation ([place])   . Hypercholesteremia   . Hypertension   . Morbid obesity with BMI of 45.0-49.9, adult ([place])   . Neuropathy   . Pancreatitis, acute   . Pneumonia   . Spinal stenosis     HOSPITAL COURSE:  56 year old male with end-stage renal disease on hemodialysis and chronic hypoxic respiratory failure on 3 L of oxygen who presented with shortness of breath   1. Acute on chronic hypoxic respiratory failure in the setting of acute diastolic heart failure and pulmonary edema/fluid overload  Patient has been weaned off of BiPAP and [place].   2. Acute on chronic diastolic heart failure due to fluid overload: Patient has improved. He is referred to CHF clinic upon discharge.    3. End-stage renal disease and hematemesis:  he had dialysis while in the hospital. He will continue his normal Tuesday, Thursday and Saturday schedule.   4. Essential hypertension: Blood pressure was low after dialysis and therefore Corag has been  discontinued for now Blood pressure has improved however it is low/normal. I decreased Corag to 3.125 mg twice a day.   5. HLD; Continue Atorvastatin  6. Diabetes: Continue SSI  7. Hypothyroid: Continue Synthroid  8. Depression: Continue Zoloft  9. Recent MSSA bacteremia: Patient is completing antibiotics. He will follow up with ID in 1 week.  10. Recent non-STEMI: Continue aspirin, statin, Coreg.   DISCHARGE CONDITIONS AND DIET:   Stable for discharge on renal diabetic diet  CONSULTS OBTAINED:  Treatment Team:  Lavonia Dana, MD  DRUG ALLERGIES:   Allergies  Allergen Reactions  . Naproxen Rash    DISCHARGE MEDICATIONS:   Allergies as of 02/11/2017      Reactions   Naproxen Rash      Medication List    STOP taking these medications   ceFAZolin 2 g in dextrose 5 % 100 mL ivpb     TAKE these medications   albuterol 108 (90 Base) MCG/ACT inhaler Commonly known as:  PROVENTIL HFA;VENTOLIN HFA Inhale 1-2 puffs every 4 (four) hours as needed into the lungs for wheezing or shortness of breath.   aspirin 81 MG EC tablet Take 1 tablet (81 mg total) by mouth daily.   atorvastatin 80 MG tablet Commonly known as:  LIPITOR Take 80 mg daily by mouth.   carvedilol 3.125 MG tablet Commonly known as:  COREG Take 1 tablet (3.125 mg total) by mouth 2 (two) times daily with a meal. What changed:    medication strength  how much to take  gabapentin 100 MG capsule Commonly known as:  NEURONTIN Take 1 capsule (100 mg total) by mouth at bedtime.   levothyroxine 50 MCG tablet Commonly known as:  SYNTHROID, LEVOTHROID Take 50 mcg daily before breakfast by mouth.   sertraline 25 MG tablet Commonly known as:  ZOLOFT Take 25 mg by mouth daily.   STIOLTO RESPIMAT 2.5-2.5 MCG/ACT Aers Generic drug:  Tiotropium Bromide-Olodaterol Inhale 2 puffs into the lungs daily.   Vitamin D (Ergocalciferol) 50000 units Caps capsule Commonly known as:  DRISDOL Take 50,000  Units by mouth every 7 (seven) days. Patient takes on Sunday         Today   CHIEF COMPLAINT:  No acute issues overnight. Patient is doing well. Would like to go home.   VITAL SIGNS:  Blood pressure (!) 121/49, pulse 66, temperature 98.7 F (37.1 C), temperature source Oral, resp. rate 15, height 5\' 10"  (1.778 m), weight (!) 142.7 kg (314 lb 9.6 oz), SpO2 92 %.   REVIEW OF SYSTEMS:  Review of Systems  Constitutional: Negative.  Negative for chills, fever and malaise/fatigue.  HENT: Negative.  Negative for ear discharge, ear pain, hearing loss, nosebleeds and sore throat.   Eyes: Negative.  Negative for blurred vision and pain.  Respiratory: Negative.  Negative for cough, hemoptysis, shortness of breath and wheezing.   Cardiovascular: Negative.  Negative for chest pain, palpitations and leg swelling (baseline).  Gastrointestinal: Negative.  Negative for abdominal pain, blood in stool, diarrhea, nausea and vomiting.  Genitourinary: Negative.  Negative for dysuria.  Musculoskeletal: Negative.  Negative for back pain.  Skin: Negative.   Neurological: Negative for dizziness, tremors, speech change, focal weakness, seizures and headaches.  Endo/Heme/Allergies: Negative.  Does not bruise/bleed easily.  Psychiatric/Behavioral: Negative.  Negative for depression, hallucinations and suicidal ideas.     PHYSICAL EXAMINATION:  GENERAL:  56 y.o.-year-old patient lying in the bed with no acute distress.  NECK:  Supple, no jugular venous distention. No thyroid enlargement, no tenderness.  LUNGS: Normal breath sounds bilaterally, no wheezing, rales,rhonchi  No use of accessory muscles of respiration.  CARDIOVASCULAR: S1, S2 normal. No murmurs, rubs, or gallops.  ABDOMEN: Soft, non-tender, non-distended. Bowel sounds present. No organomegaly or mass.  EXTREMITIES: 1+ LEE NOcyanosis, or clubbing.  PSYCHIATRIC: The patient is alert and oriented x 3.  SKIN: No obvious rash, lesion, or ulcer.    DATA REVIEW:   CBC Recent Labs  Lab 02/09/17 0547  WBC 15.7*  HGB 8.7*  HCT 27.8*  PLT 266    Chemistries  Recent Labs  Lab 02/10/17 0406  NA 140  K 3.7  CL 103  CO2 28  GLUCOSE 80  BUN 21*  CREATININE 3.39*  CALCIUM 8.2*    Cardiac Enzymes Recent Labs  Lab 02/08/17 2314  TROPONINI 0.04*    Microbiology Results  @MICRORSLT48 @  RADIOLOGY:  Dg Chest Port 1 View  Result Date: 02/10/2017 CLINICAL DATA:  Followup pulmonary edema EXAM: PORTABLE CHEST 1 VIEW COMPARISON:  02/08/2017 FINDINGS: Central line unchanged with its tip at the SVC RA junction. Slightly last diffuse edema. Mild persistent basilar atelectasis. IMPRESSION: Radiographic improvement with slightly less pulmonary edema. Electronically Signed   By: Nelson Chimes M.D.   On: 02/10/2017 06:38      Allergies as of 02/11/2017      Reactions   Naproxen Rash      Medication List    STOP taking these medications   ceFAZolin 2 g in dextrose 5 % 100 mL ivpb  TAKE these medications   albuterol 108 (90 Base) MCG/ACT inhaler Commonly known as:  PROVENTIL HFA;VENTOLIN HFA Inhale 1-2 puffs every 4 (four) hours as needed into the lungs for wheezing or shortness of breath.   aspirin 81 MG EC tablet Take 1 tablet (81 mg total) by mouth daily.   atorvastatin 80 MG tablet Commonly known as:  LIPITOR Take 80 mg daily by mouth.   carvedilol 3.125 MG tablet Commonly known as:  COREG Take 1 tablet (3.125 mg total) by mouth 2 (two) times daily with a meal. What changed:    medication strength  how much to take   gabapentin 100 MG capsule Commonly known as:  NEURONTIN Take 1 capsule (100 mg total) by mouth at bedtime.   levothyroxine 50 MCG tablet Commonly known as:  SYNTHROID, LEVOTHROID Take 50 mcg daily before breakfast by mouth.   sertraline 25 MG tablet Commonly known as:  ZOLOFT Take 25 mg by mouth daily.   STIOLTO RESPIMAT 2.5-2.5 MCG/ACT Aers Generic drug:  Tiotropium  Bromide-Olodaterol Inhale 2 puffs into the lungs daily.   Vitamin D (Ergocalciferol) 50000 units Caps capsule Commonly known as:  DRISDOL Take 50,000 Units by mouth every 7 (seven) days. Patient takes on Sunday         Management plans discussed with the patient and he is in agreement. Stable for discharge   Patient should follow up with pcp  CODE STATUS:     Code Status Orders  (From admission, onward)        Start     Ordered   02/09/17 0146  Full code  Continuous     12 /19/18 0145    Code Status History    Date Active Date Inactive Code Status Order ID Comments User Context   01/23/2017 14:32 01/31/2017 20:32 Full Code [id number]  Nicholes Mango, MD Inpatient   12/30/2016 00:42 01/06/2017 21:30 Full Code [id number]  Lance Coon, MD Inpatient   02/27/2016 00:23 02/28/2016 16:06 Full Code [id number]  Harvie Bridge, DO Inpatient   02/08/2016 00:28 02/09/2016 18:20 Full Code [id number]  Laverle Hobby, MD ED   01/06/2015 00:09 01/08/2015 21:38 Full Code [id number]  Hubbard Robinson, MD ED      TOTAL TIME TAKING CARE OF THIS PATIENT: 38 minutes.    Note: This dictation was prepared with Dragon dictation along with smaller phrase technology. Any transcriptional errors that result from this process are unintentional.  Grenda Lora M.D on 02/11/2017 at 9:40 AM  Between 7am to 6pm - Pager - [phone] After 6pm go to [email or web address] - password [email or web address] [facility]  Office  [phone]  CC: Primary care physician; Lorelee Market, MD

## 2017-02-11 NOTE — Progress Notes (Signed)
[facility]  ROUNDING NOTE   Subjective:   Wife at bedside.   Hemodialysis treatment yesterday. Tolerated treatment well. UF of 4.9 liters.   Objective:  Vital signs in last 24 hours:  Temp:  [98.7 F (37.1 C)-99 F (37.2 C)] 98.7 F (37.1 C) (12/21 1339) Pulse Rate:  [66-72] 69 (12/21 1339) Resp:  [12-20] 15 (12/21 0449) BP: (91-129)/(49-85) 123/85 (12/21 1339) SpO2:  [89 %-100 %] 100 % (12/21 1339) Weight:  [142.7 kg (314 lb 9.6 oz)] 142.7 kg (314 lb 9.6 oz) (12/21 0450)  Weight change: -6 kg (-3.6 oz) Filed Weights   02/10/17 1030 02/10/17 1448 02/11/17 0450  Weight: (!) 147 kg (324 lb 1.2 oz) (!) 142 kg (313 lb 0.9 oz) (!) 142.7 kg (314 lb 9.6 oz)    Intake/Output: I/O last 3 completed shifts: In: 477 [P.O.:477] Out: 7533 [[id number]]   Intake/Output this shift:  Total I/O In: 240 [P.O.:240] Out: -   Physical Exam: General: NAD  Head: +BIPAP  Eyes: Anicteric, PERRL  Neck: Supple, trachea midline  Lungs:  Bilateral crackles  Heart: Regular rate and rhythm  Abdomen:  Soft, nontender,   Extremities: trace peripheral edema.  Neurologic: Nonfocal, moving all four extremities  Skin: No lesions  Access: permcath [facility]    Basic Metabolic Panel: Recent Labs  Lab 02/08/17 2314 02/09/17 0547 02/10/17 0406  NA 141 140 140  K 3.9 4.4 3.7  CL 101 105 103  CO2 28 26 28   GLUCOSE 158* 172* 80  BUN 39* 41* 21*  CREATININE 4.30* 4.50* 3.39*  CALCIUM 8.4* 8.4* 8.2*    Liver Function Tests: No results for input(s): AST, ALT, ALKPHOS, BILITOT, PROT, ALBUMIN in the last 168 hours. No results for input(s): LIPASE, AMYLASE in the last 168 hours. No results for input(s): AMMONIA in the last 168 hours.  CBC: Recent Labs  Lab 02/08/17 2314 02/09/17 0547  WBC 13.6* 15.7*  HGB 9.3* 8.7*  HCT 29.2* 27.8*  MCV 91.3 92.7  PLT 280 266    Cardiac Enzymes: Recent Labs  Lab 02/08/17 2314  TROPONINI 0.04*    BNP: Invalid input(s):  POCBNP  CBG: Recent Labs  Lab 02/10/17 1159 02/10/17 1639 02/10/17 2218 02/11/17 0727 02/11/17 1138  GLUCAP 93 82 103* 7 150*    Microbiology: Results for orders placed or performed during the hospital encounter of 02/08/17  MRSA PCR Screening     Status: None   Collection Time: 02/09/17  9:20 PM  Result Value Ref Range Status   MRSA by PCR NEGATIVE NEGATIVE Final    Comment:        The GeneXpert MRSA Assay (FDA approved for NASAL specimens only), is one component of a comprehensive MRSA colonization surveillance program. It is not intended to diagnose MRSA infection nor to guide or monitor treatment for MRSA infections.     Coagulation Studies: No results for input(s): LABPROT, INR in the last 72 hours.  Urinalysis: No results for input(s): COLORURINE, LABSPEC, PHURINE, GLUCOSEU, HGBUR, BILIRUBINUR, KETONESUR, PROTEINUR, UROBILINOGEN, NITRITE, LEUKOCYTESUR in the last 72 hours.  Invalid input(s): APPERANCEUR    Imaging: Dg Chest Port 1 View  Result Date: 02/10/2017 CLINICAL DATA:  Followup pulmonary edema EXAM: PORTABLE CHEST 1 VIEW COMPARISON:  02/08/2017 FINDINGS: Central line unchanged with its tip at the SVC RA junction. Slightly last diffuse edema. Mild persistent basilar atelectasis. IMPRESSION: Radiographic improvement with slightly less pulmonary edema. Electronically Signed   By: Nelson Chimes M.D.   On: 02/10/2017 06:38  Medications:   . albumin human     . aspirin EC  81 mg Oral Daily  . atorvastatin  80 mg Oral Daily  . epoetin (EPOGEN/PROCRIT) injection  10,000 Units Intravenous Q T,Th,Sa-HD  . heparin  5,000 Units Subcutaneous Q8H  . insulin aspart  0-5 Units Subcutaneous QHS  . insulin aspart  0-9 Units Subcutaneous TID WC  . ipratropium-albuterol  3 mL Nebulization Q6H  . levothyroxine  50 mcg Oral QAC breakfast  . mouth rinse  15 mL Mouth Rinse BID  . nystatin   Topical TID  . sertraline  25 mg Oral Daily   acetaminophen **OR**  acetaminophen, ondansetron **OR** ondansetron (ZOFRAN) IV, oxyCODONE  Assessment/ Plan:  [name] is a 56 y.o. white male with end stage renal disease on hemodialysis,diabetes mellitus type II insulin dependent, diabetic peripheral neuropathy, right toe amputation, hypertension, hyperlipidemia, COPD/tobacco use, depression, coronary artery disease  TTS [facility] permcath  1. ESRD on HD TTHS: hemodialysis treatment yesterday. Tolerated treatment well. UF of 4.9 liters - Resume TTS schedule.   2. Respiratory Failure: with obstructive sleep apnea. Resolved - CPAP at night.   3. Bacteremia: MSSA on 01/23/17. Cath exchanged Vein mapping done on 01/03/17.  - cefazolin 2g IV with HD treatment until 12/26. AVF placement after that.   4. Hypertension: holding all blood pressure medications.   5. Anemia of chronic kidney disease: hemoglobin 8.7 - epo ordered for TTS HD treatments.   6. Secondary Hyperparathyroidism: not currently on binders.     LOS: [place], [place] 12/21/20183:19 PM

## 2017-03-06 NOTE — Progress Notes (Deleted)
Patient ID: Brian [name], male    DOB: 02/15/1961, 57 y.o.   MRN: [id number]  HPI  Mr Roza is a 57 y/o male with a history of pancreatitis, pneumonia, HTN, hyperlipidemia, DM, COPD, CKD, current tobacco use and chronic heart failure.   Echo done 01/25/17 reviewed and showed an EF of 50-55%. Echo done 12/30/16 reviewed and showed an EF of 55-65% along with trivial MR/TR. Previous echo was done 02/09/16 with an EF of 55-60% without valvular regurgitation.   Admitted 02/08/17 due to pulmonary edema. Initially needed bipap and then was weaned off of that. Had dialysis session during hospitalization. Was discharged after 3 days. Admitted 01/23/17 due to NSTEMI. Cardiology, vascular and ID consults were obtained. Had septic shock due to MSSA bacteremia. Permcath removed and IV antibiotics were needed. Discharged home after 8 days. Admitted 12/29/16 due to HF exacerbation. Initially needed bipap and then weaned to [place] oxygen. Elevated troponin thought to be due to demand ischemia. Cardiology consult was obtained. Nephrology consult obtained with discussion of dialysis to be set up as an outpatient. Discharged home after 8 days.    He presents today for his follow-up visit with a chief complaint of    Past Medical History:  Diagnosis Date  . CHF (congestive heart failure) ([place])   . COPD (chronic obstructive pulmonary disease) ([place])   . Diabetes mellitus type 2 in obese ([place])   . DM (diabetes mellitus) type II controlled with renal manifestation ([place])   . Hypercholesteremia   . Hypertension   . Morbid obesity with BMI of 45.0-49.9, adult ([place])   . Neuropathy   . Pancreatitis, acute   . Pneumonia   . Spinal stenosis    Past Surgical History:  Procedure Laterality Date  . AMPUTATION    . CHOLECYSTECTOMY  1998  . DIALYSIS/PERMA CATHETER INSERTION N/A 01/03/2017   Procedure: DIALYSIS/PERMA CATHETER INSERTION;  Surgeon: Algernon Huxley, MD;  Location: [place];  Service: Cardiovascular;   Laterality: N/A;  . DIALYSIS/PERMA CATHETER INSERTION N/A 01/31/2017   Procedure: DIALYSIS/PERMA CATHETER INSERTION;  Surgeon: Algernon Huxley, MD;  Location: [place];  Service: Cardiovascular;  Laterality: N/A;  . DIALYSIS/PERMA CATHETER REMOVAL N/A 01/24/2017   Procedure: DIALYSIS/PERMA CATHETER REMOVAL;  Surgeon: Algernon Huxley, MD;  Location: [facility];  Service: Cardiovascular;  Laterality: N/A;  . LAPAROSCOPIC APPENDECTOMY N/A 01/06/2015   Procedure: APPENDECTOMY LAPAROSCOPIC drainage of peritoneal abcess;  Surgeon: Sherri Rad, MD;  Location: [facility];  Service: General;  Laterality: N/A;  . TEE WITHOUT CARDIOVERSION N/A 01/28/2017   Procedure: TRANSESOPHAGEAL ECHOCARDIOGRAM (TEE);  Surgeon: Corey Skains, MD;  Location: [facility];  Service: Cardiovascular;  Laterality: N/A;   Family History  Problem Relation Age of Onset  . Hypertension Mother   . Hyperlipidemia Mother   . Heart disease Father   . Heart disease Maternal Grandfather    Social History   Tobacco Use  . Smoking status: Current Every Day Smoker    Packs/day: 1.00    Types: Cigarettes  . Smokeless tobacco: Never Used  Substance Use Topics  . Alcohol use: No   Allergies  Allergen Reactions  . Naproxen Rash     Review of Systems  Constitutional: Positive for fatigue. Negative for appetite change.  HENT: Negative for congestion, postnasal drip and sore throat.   Eyes: Negative.   Respiratory: Positive for cough and shortness of breath (minimal). Negative for chest tightness.   Cardiovascular: Positive for leg swelling.  Negative for chest pain and palpitations.  Gastrointestinal: Negative for abdominal distention and abdominal pain.  Endocrine: Negative.   Genitourinary: Negative.   Musculoskeletal: Positive for arthralgias (left leg pain) and back pain (chronic).  Skin: Negative.   Allergic/Immunologic: Negative.   Neurological: Positive for weakness (in legs). Negative for dizziness,  light-headedness and headaches.  Hematological: Negative for adenopathy. Bruises/bleeds easily (right side of neck).  Psychiatric/Behavioral: Negative for dysphoric mood, sleep disturbance and suicidal ideas. The patient is not nervous/anxious.     Physical Exam  Constitutional: He is oriented to person, place, and time. He appears well-developed and well-nourished.  HENT:  Head: Normocephalic and atraumatic.  Neck: Normal range of motion. Neck supple. No JVD present.  Cardiovascular: Normal rate and regular rhythm.  Pulmonary/Chest: Effort normal. He has no wheezes. He has no rales.  Abdominal: Soft. He exhibits no distension. There is no tenderness.  Musculoskeletal: He exhibits edema (2+ pitting edema in bilateral lower legs). He exhibits no tenderness.  Neurological: He is alert and oriented to person, place, and time.  Skin: Skin is warm and dry.  Psychiatric: He has a normal mood and affect. His behavior is normal. Thought content normal.  Nursing note and vitals reviewed.    Assessment & Plan:  1: Chronic heart failure with preserved ejection fraction- - NYHA class II - moderate fluid overload with pedal edema present  - already weighing daily. Weight is stable from his last visit here. Reminded to call for an overnight weight gain of >2 pounds or a weekly weight gain of >5 pounds.  - currently taking torsemide 100mg  daily  - not adding salt to his food. Reading food labels. Did eat a bacon, egg, cheese biscuit this morning. Discussed sodium content of that food.  - saw cardiologist ([place]) 10/29/16  - BMP from 02/10/17 reviewed and shows sodium 140, potassium 3.7 and GFR 19 - does not meet ReDs criteria due to BMI - reports receiving his flu vaccine for the season already - would like a liftchair and he is going to call his insurance to see if they will pay. Says that some chairs at home sit low making it difficult for him to get out of them due to leg weakness - PharmD  went in and reviewed medications with the patient  2: DM- - nonfasting glucose in the clinic was  - has been taken off insulin and he was encouraged to keep a close eye on his glucose levels - beginning outpatient dialysis 3 days/week beginning tomorrow - follows with Dr. Juleen China - A1c from 01/02/17 was 6.8%  3: Lymphedema-  - stage 2 lymphedema present - does elevate the legs but without relief of edema - has worn compression socks but, again, without much relief of edema - has heard from [facility] [place] but he has to get back in touch with them regarding delivery  4: Tobacco use- - smoking < 1/2 ppd of cigarettes now and is working on quitting completely - discussed complete cessation for 3 minutes with him  5: Obstructive sleep apnea- - no longer wearing CPAP and he has returned the equipment - now wears oxygen at 3L at bedtime & reports sleeping well  Patient did not bring his medications nor a list. Each medication was verbally reviewed with the patient and he was encouraged to bring the bottles to every visit to confirm accuracy of list.

## 2017-03-07 ENCOUNTER — Ambulatory Visit: Payer: Medicaid Other | Admitting: Family

## 2017-03-15 ENCOUNTER — Encounter (INDEPENDENT_AMBULATORY_CARE_PROVIDER_SITE_OTHER): Payer: Medicaid Other | Admitting: Vascular Surgery

## 2017-03-18 ENCOUNTER — Other Ambulatory Visit: Payer: Self-pay | Admitting: Family

## 2017-04-05 ENCOUNTER — Ambulatory Visit (INDEPENDENT_AMBULATORY_CARE_PROVIDER_SITE_OTHER): Payer: Medicare Other | Admitting: Vascular Surgery

## 2017-04-05 ENCOUNTER — Encounter (INDEPENDENT_AMBULATORY_CARE_PROVIDER_SITE_OTHER): Payer: Self-pay | Admitting: Vascular Surgery

## 2017-04-05 VITALS — BP 100/67 | HR 80 | Resp 17 | Ht 70.0 in | Wt 294.0 lb

## 2017-04-05 DIAGNOSIS — I10 Essential (primary) hypertension: Secondary | ICD-10-CM | POA: Diagnosis not present

## 2017-04-05 DIAGNOSIS — N186 End stage renal disease: Secondary | ICD-10-CM | POA: Diagnosis not present

## 2017-04-05 DIAGNOSIS — Z992 Dependence on renal dialysis: Secondary | ICD-10-CM

## 2017-04-05 DIAGNOSIS — E785 Hyperlipidemia, unspecified: Secondary | ICD-10-CM | POA: Diagnosis not present

## 2017-04-05 DIAGNOSIS — E1122 Type 2 diabetes mellitus with diabetic chronic kidney disease: Secondary | ICD-10-CM

## 2017-04-05 NOTE — Assessment & Plan Note (Signed)
lipid control important in reducing the progression of atherosclerotic disease. Continue statin therapy  

## 2017-04-05 NOTE — Assessment & Plan Note (Signed)
Represents an underlying cause of his renal failure. blood glucose control important in reducing the progression of atherosclerotic disease. Also, involved in wound healing. On appropriate medications.

## 2017-04-05 NOTE — Progress Notes (Signed)
Patient ID: Brian [name], male   DOB: [birth year], 57 y.o.   MRN: [id number]  Chief Complaint  Patient presents with  . New Patient (Initial Visit)    pt had vein mapping in november    HPI Brian [name] is a 57 y.o. male.  I am asked to see the patient by DR. Kolluru for evaluation of permanent dialysis access.  The patient reports his catheter is currently working reasonably well, but it does hurt.  He is right-hand dominant.  He gets dialysis on Mondays, Wednesdays, and Fridays.  He had a right sided PermCath infection shortly after it was placed and is now using a left-sided PermCath we placed 2 months ago.  He denies fevers or chills.  He has lost about 40 pounds of weight and has increased his activity since discharge.   Past Medical History:  Diagnosis Date  . CHF (congestive heart failure) ([place])   . COPD (chronic obstructive pulmonary disease) ([place])   . Diabetes mellitus type 2 in obese ([place])   . DM (diabetes mellitus) type II controlled with renal manifestation ([place])   . Hypercholesteremia   . Hypertension   . Morbid obesity with BMI of 45.0-49.9, adult ([place])   . Neuropathy   . Pancreatitis, acute   . Pneumonia   . Spinal stenosis     Past Surgical History:  Procedure Laterality Date  . AMPUTATION    . CHOLECYSTECTOMY  1998  . DIALYSIS/PERMA CATHETER INSERTION N/A 01/03/2017   Procedure: DIALYSIS/PERMA CATHETER INSERTION;  Surgeon: Algernon Huxley, MD;  Location: [facility];  Service: Cardiovascular;  Laterality: N/A;  . DIALYSIS/PERMA CATHETER INSERTION N/A 01/31/2017   Procedure: DIALYSIS/PERMA CATHETER INSERTION;  Surgeon: Algernon Huxley, MD;  Location: [facility];  Service: Cardiovascular;  Laterality: N/A;  . DIALYSIS/PERMA CATHETER REMOVAL N/A 01/24/2017   Procedure: DIALYSIS/PERMA CATHETER REMOVAL;  Surgeon: Algernon Huxley, MD;  Location: [facility];  Service: Cardiovascular;  Laterality: N/A;  . LAPAROSCOPIC APPENDECTOMY N/A  01/06/2015   Procedure: APPENDECTOMY LAPAROSCOPIC drainage of peritoneal abcess;  Surgeon: Sherri Rad, MD;  Location: [facility];  Service: General;  Laterality: N/A;  . TEE WITHOUT CARDIOVERSION N/A 01/28/2017   Procedure: TRANSESOPHAGEAL ECHOCARDIOGRAM (TEE);  Surgeon: Corey Skains, MD;  Location: [facility];  Service: Cardiovascular;  Laterality: N/A;    Family History  Problem Relation Age of Onset  . Hypertension Mother   . Hyperlipidemia Mother   . Heart disease Father   . Heart disease Maternal Grandfather      Social History Social History   Tobacco Use  . Smoking status: Current Every Day Smoker    Packs/day: 1.00    Types: Cigarettes  . Smokeless tobacco: Never Used  Substance Use Topics  . Alcohol use: No  . Drug use: No     Allergies  Allergen Reactions  . Naproxen Rash    Current Outpatient Medications  Medication Sig Dispense Refill  . albuterol (PROVENTIL HFA;VENTOLIN HFA) 108 (90 Base) MCG/ACT inhaler Inhale 1-2 puffs every 4 (four) hours as needed into the lungs for wheezing or shortness of breath.    Marland Kitchen aspirin EC 81 MG EC tablet Take 1 tablet (81 mg total) by mouth daily. 30 tablet 0  . atorvastatin (LIPITOR) 80 MG tablet Take 80 mg daily by mouth.    . carvedilol (COREG) 3.125 MG tablet Take 1 tablet (3.125 mg total) by mouth 2 (two) times daily with a meal. 60 tablet  0  . furosemide (LASIX) 40 MG tablet Take 40 mg by mouth every other day.    . gabapentin (NEURONTIN) 100 MG capsule Take 1 capsule (100 mg total) by mouth at bedtime.    Marland Kitchen levothyroxine (SYNTHROID, LEVOTHROID) 50 MCG tablet Take 50 mcg daily before breakfast by mouth.    . sertraline (ZOLOFT) 25 MG tablet Take 25 mg by mouth daily.     . Tiotropium Bromide-Olodaterol (STIOLTO RESPIMAT) 2.5-2.5 MCG/ACT AERS Inhale 2 puffs into the lungs daily.     . vitamin C (ASCORBIC ACID) 500 MG tablet Take 500 mg by mouth daily.    . Vitamin D, Ergocalciferol, (DRISDOL) 50000 units CAPS capsule Take  50,000 Units by mouth every 7 (seven) days. Patient takes on Sunday     No current facility-administered medications for this visit.       REVIEW OF SYSTEMS (Negative unless checked)  Constitutional: '[]' Weight loss  '[]' Fever  '[]' Chills Cardiac: '[]' Chest pain   '[]' Chest pressure   '[]' Palpitations   '[]' Shortness of breath when laying flat   '[]' Shortness of breath at rest   '[]' Shortness of breath with exertion. Vascular:  '[]' Pain in legs with walking   '[]' Pain in legs at rest   '[]' Pain in legs when laying flat   '[]' Claudication   '[]' Pain in feet when walking  '[]' Pain in feet at rest  '[]' Pain in feet when laying flat   '[]' History of DVT   '[]' Phlebitis   '[x]' Swelling in legs   '[]' Varicose veins   '[]' Non-healing ulcers Pulmonary:   '[]' Uses home oxygen   '[]' Productive cough   '[]' Hemoptysis   '[]' Wheeze  '[]' COPD   '[]' Asthma Neurologic:  '[]' Dizziness  '[]' Blackouts   '[]' Seizures   '[]' History of stroke   '[]' History of TIA  '[]' Aphasia   '[]' Temporary blindness   '[]' Dysphagia   '[]' Weakness or numbness in arms   '[]' Weakness or numbness in legs Musculoskeletal:  '[]' Arthritis   '[]' Joint swelling   '[]' Joint pain   '[]' Low back pain Hematologic:  '[]' Easy bruising  '[]' Easy bleeding   '[]' Hypercoagulable state   '[x]' Anemic  '[]' Hepatitis Gastrointestinal:  '[]' Blood in stool   '[]' Vomiting blood  '[x]' Gastroesophageal reflux/heartburn   '[]' Abdominal pain Genitourinary:  '[x]' Chronic kidney disease   '[]' Difficult urination  '[]' Frequent urination  '[]' Burning with urination   '[]' Hematuria Skin:  '[]' Rashes   '[]' Ulcers   '[]' Wounds Psychological:  '[]' History of anxiety   '[]'  History of major depression.    Physical Exam BP 100/67 (BP Location: Right Arm)   Pulse 80   Resp 17   Ht '5\' 10"'  (1.778 m)   Wt 133.4 kg (294 lb)   BMI 42.18 kg/m  Gen:  WD/WN, NAD. obese Head: [place]/AT, No temporalis wasting.  Ear/Nose/Throat: Hearing grossly intact, nares w/o erythema or drainage, oropharynx w/o Erythema/Exudate Eyes: Conjunctiva clear, sclera non-icteric  Neck: trachea midline.  No  bruit.  Pulmonary:  Good air movement, respirations not labored, no use of accessory muscles Cardiac: RRR, no JVD Vascular: PermCath clean, dry, and intact and exiting from the left subclavicular region Vessel Right Left  Radial Palpable Palpable                                   Gastrointestinal: soft, non-tender/non-distended.  Musculoskeletal: M/S 5/5 throughout.  Extremities without ischemic changes.  No deformity or atrophy.  1+ bilateral lower extremity edema.  Using a wheelchair Neurologic: Sensation grossly intact in extremities.  Symmetrical.  Speech is fluent. Motor exam as  listed above. Psychiatric: Judgment intact, Mood & affect appropriate for pt's clinical situation. Dermatologic: No rashes or ulcers noted.  No cellulitis or open wounds.  Radiology No results found.  Labs Recent Results (from the past 2160 hour(s))  Glucose, capillary     Status: Abnormal   Collection Time: 01/05/17  4:50 PM  Result Value Ref Range   Glucose-Capillary 169 (H) 65 - 99 mg/dL   Comment 1 Notify RN   Glucose, capillary     Status: Abnormal   Collection Time: 01/05/17  8:37 PM  Result Value Ref Range   Glucose-Capillary 150 (H) 65 - 99 mg/dL  Glucose, capillary     Status: Abnormal   Collection Time: 01/06/17 12:06 AM  Result Value Ref Range   Glucose-Capillary 147 (H) 65 - 99 mg/dL  Glucose, capillary     Status: Abnormal   Collection Time: 01/06/17  4:10 AM  Result Value Ref Range   Glucose-Capillary 137 (H) 65 - 99 mg/dL  Basic metabolic panel     Status: Abnormal   Collection Time: 01/06/17  4:18 AM  Result Value Ref Range   Sodium 137 135 - 145 mmol/L   Potassium 3.0 (L) 3.5 - 5.1 mmol/L   Chloride 97 (L) 101 - 111 mmol/L   CO2 29 22 - 32 mmol/L   Glucose, Bld 121 (H) 65 - 99 mg/dL   BUN 38 (H) 6 - 20 mg/dL   Creatinine, Ser 2.84 (H) 0.61 - 1.24 mg/dL   Calcium 8.1 (L) 8.9 - 10.3 mg/dL   GFR calc non Af Amer 23 (L) >60 mL/min   GFR calc Af Amer 27 (L) >60 mL/min     Comment: (NOTE) The eGFR has been calculated using the CKD EPI equation. This calculation has not been validated in all clinical situations. eGFR's persistently <60 mL/min signify possible Chronic Kidney Disease.    Anion gap 11 5 - 15  Glucose, capillary     Status: Abnormal   Collection Time: 01/06/17  7:42 AM  Result Value Ref Range   Glucose-Capillary 124 (H) 65 - 99 mg/dL  Glucose, capillary     Status: Abnormal   Collection Time: 01/06/17 11:34 AM  Result Value Ref Range   Glucose-Capillary 141 (H) 65 - 99 mg/dL  Glucose, capillary     Status: Abnormal   Collection Time: 01/10/17 10:23 AM  Result Value Ref Range   Glucose-Capillary 213 (H) 65 - 99 mg/dL  Blood gas, venous     Status: Abnormal   Collection Time: 01/23/17 11:40 AM  Result Value Ref Range   pH, Ven 7.31 7.250 - 7.430   pCO2, Ven 46 44.0 - 60.0 mmHg   Bicarbonate 23.2 20.0 - 28.0 mmol/L   Acid-base deficit 3.1 (H) 0.0 - 2.0 mmol/L   Patient temperature 37.0    Collection site VEIN    Sample type VEIN   CBC with Differential     Status: Abnormal   Collection Time: 01/23/17 11:44 AM  Result Value Ref Range   WBC 9.9 3.8 - 10.6 K/uL   RBC 3.30 (L) 4.40 - 5.90 MIL/uL   Hemoglobin 9.9 (L) 13.0 - 18.0 g/dL   HCT 30.2 (L) 40.0 - 52.0 %   MCV 91.5 80.0 - 100.0 fL   MCH 30.0 26.0 - 34.0 pg   MCHC 32.8 32.0 - 36.0 g/dL   RDW 15.3 (H) 11.5 - 14.5 %   Platelets 218 150 - 440 K/uL   Neutrophils Relative % 85 %  Neutro Abs 8.5 (H) 1.4 - 6.5 K/uL   Lymphocytes Relative 7 %   Lymphs Abs 0.6 (L) 1.0 - 3.6 K/uL   Monocytes Relative 7 %   Monocytes Absolute 0.7 0.2 - 1.0 K/uL   Eosinophils Relative 0 %   Eosinophils Absolute 0.0 0 - 0.7 K/uL   Basophils Relative 1 %   Basophils Absolute 0.1 0 - 0.1 K/uL  Comprehensive metabolic panel     Status: Abnormal   Collection Time: 01/23/17 11:44 AM  Result Value Ref Range   Sodium 132 (L) 135 - 145 mmol/L   Potassium 4.3 3.5 - 5.1 mmol/L   Chloride 100 (L) 101 - 111  mmol/L   CO2 20 (L) 22 - 32 mmol/L   Glucose, Bld 182 (H) 65 - 99 mg/dL   BUN 52 (H) 6 - 20 mg/dL   Creatinine, Ser 4.92 (H) 0.61 - 1.24 mg/dL   Calcium 8.5 (L) 8.9 - 10.3 mg/dL   Total Protein 7.3 6.5 - 8.1 g/dL   Albumin 3.0 (L) 3.5 - 5.0 g/dL   AST 119 (H) 15 - 41 U/L   ALT 30 17 - 63 U/L   Alkaline Phosphatase 68 38 - 126 U/L   Total Bilirubin 0.4 0.3 - 1.2 mg/dL   GFR calc non Af Amer 12 (L) >60 mL/min   GFR calc Af Amer 14 (L) >60 mL/min    Comment: (NOTE) The eGFR has been calculated using the CKD EPI equation. This calculation has not been validated in all clinical situations. eGFR's persistently <60 mL/min signify possible Chronic Kidney Disease.    Anion gap 12 5 - 15  CULTURE, BLOOD (ROUTINE X 2) w Reflex to ID Panel     Status: Abnormal   Collection Time: 01/23/17 11:44 AM  Result Value Ref Range   Specimen Description BLOOD RIGHT ANTECUBITAL    Special Requests      BOTTLES DRAWN AEROBIC AND ANAEROBIC Blood Culture results may not be optimal due to an excessive volume of blood received in culture bottles   Culture  Setup Time      GRAM POSITIVE COCCI IN BOTH AEROBIC AND ANAEROBIC BOTTLES CRITICAL RESULT CALLED TO, READ BACK BY AND VERIFIED WITH: M[place] MCBANE AT [address] ON 01/24/17 [place].    Culture STAPHYLOCOCCUS AUREUS (A)    Report Status 01/26/2017 FINAL    Organism ID, Bacteria STAPHYLOCOCCUS AUREUS       Susceptibility   Staphylococcus aureus - MIC*    CIPROFLOXACIN <=0.5 SENSITIVE Sensitive     ERYTHROMYCIN <=0.25 SENSITIVE Sensitive     GENTAMICIN <=0.5 SENSITIVE Sensitive     OXACILLIN <=0.25 SENSITIVE Sensitive     TETRACYCLINE <=1 SENSITIVE Sensitive     VANCOMYCIN <=0.5 SENSITIVE Sensitive     TRIMETH/SULFA <=10 SENSITIVE Sensitive     CLINDAMYCIN <=0.25 SENSITIVE Sensitive     RIFAMPIN <=0.5 SENSITIVE Sensitive     Inducible Clindamycin NEGATIVE Sensitive     * STAPHYLOCOCCUS AUREUS  CULTURE, BLOOD (ROUTINE X 2) w Reflex to ID Panel     Status:  Abnormal   Collection Time: 01/23/17 11:44 AM  Result Value Ref Range   Specimen Description BLOOD BLOOD LEFT FOREARM    Special Requests      BOTTLES DRAWN AEROBIC AND ANAEROBIC Blood Culture results may not be optimal due to an excessive volume of blood received in culture bottles   Culture  Setup Time      GRAM POSITIVE COCCI IN BOTH AEROBIC AND ANAEROBIC BOTTLES  CRITICAL VALUE NOTED.  VALUE IS CONSISTENT WITH PREVIOUSLY REPORTED AND CALLED VALUE.    Culture (A)     STAPHYLOCOCCUS AUREUS SUSCEPTIBILITIES PERFORMED ON PREVIOUS CULTURE WITHIN THE LAST 5 DAYS. Performed at [facility], [place] [address], [place], [place] [address]    Report Status 01/26/2017 FINAL   Lactic acid, plasma     Status: None   Collection Time: 01/23/17 11:44 AM  Result Value Ref Range   Lactic Acid, Venous 1.5 0.5 - 1.9 mmol/L  Lipase, blood     Status: None   Collection Time: 01/23/17 11:44 AM  Result Value Ref Range   Lipase 22 11 - 51 U/L  Troponin I     Status: Abnormal   Collection Time: 01/23/17 11:44 AM  Result Value Ref Range   Troponin I >65.00 (HH) <0.03 ng/mL    Comment: CRITICAL RESULT CALLED TO, READ BACK BY AND VERIFIED WITH FELICIA STAROPOLI ON 76/1/95 AT 1226 [facility]   APTT     Status: Abnormal   Collection Time: 01/23/17 11:44 AM  Result Value Ref Range   aPTT 37 (H) 24 - 36 seconds    Comment:        IF BASELINE aPTT IS ELEVATED, SUGGEST PATIENT RISK ASSESSMENT BE USED TO DETERMINE APPROPRIATE ANTICOAGULANT THERAPY.   Protime-INR     Status: Abnormal   Collection Time: 01/23/17 11:44 AM  Result Value Ref Range   Prothrombin Time 15.9 (H) 11.4 - 15.2 seconds   INR 1.28   Blood Culture ID Panel (Reflexed)     Status: Abnormal   Collection Time: 01/23/17 11:44 AM  Result Value Ref Range   Enterococcus species NOT DETECTED NOT DETECTED   Listeria monocytogenes NOT DETECTED NOT DETECTED   Staphylococcus species DETECTED (A) NOT DETECTED    Comment: CRITICAL RESULT CALLED  TO, READ BACK BY AND VERIFIED WITH: MATT MCBANE AT 0700 ON 01/24/17 [place].    Staphylococcus aureus DETECTED (A) NOT DETECTED    Comment: Methicillin (oxacillin) susceptible Staphylococcus aureus (MSSA). Preferred therapy is anti staphylococcal beta lactam antibiotic (Cefazolin or Nafcillin), unless clinically contraindicated. CRITICAL RESULT CALLED TO, READ BACK BY AND VERIFIED WITH: MATT MCBANE AT 0700 ON 01/24/17 [place].    Methicillin resistance NOT DETECTED NOT DETECTED   Streptococcus species NOT DETECTED NOT DETECTED   Streptococcus agalactiae NOT DETECTED NOT DETECTED   Streptococcus pneumoniae NOT DETECTED NOT DETECTED   Streptococcus pyogenes NOT DETECTED NOT DETECTED   Acinetobacter baumannii NOT DETECTED NOT DETECTED   Enterobacteriaceae species NOT DETECTED NOT DETECTED   Enterobacter cloacae complex NOT DETECTED NOT DETECTED   Escherichia coli NOT DETECTED NOT DETECTED   Klebsiella oxytoca NOT DETECTED NOT DETECTED   Klebsiella pneumoniae NOT DETECTED NOT DETECTED   Proteus species NOT DETECTED NOT DETECTED   Serratia marcescens NOT DETECTED NOT DETECTED   Haemophilus influenzae NOT DETECTED NOT DETECTED   Neisseria meningitidis NOT DETECTED NOT DETECTED   Pseudomonas aeruginosa NOT DETECTED NOT DETECTED   Candida albicans NOT DETECTED NOT DETECTED   Candida glabrata NOT DETECTED NOT DETECTED   Candida krusei NOT DETECTED NOT DETECTED   Candida parapsilosis NOT DETECTED NOT DETECTED   Candida tropicalis NOT DETECTED NOT DETECTED  C difficile quick scan w PCR reflex     Status: None   Collection Time: 01/23/17  1:24 PM  Result Value Ref Range   C Diff antigen NEGATIVE NEGATIVE   C Diff toxin NEGATIVE NEGATIVE   C Diff interpretation No C. difficile detected.  Glucose, capillary     Status: Abnormal   Collection Time: 01/23/17  2:20 PM  Result Value Ref Range   Glucose-Capillary 197 (H) 65 - 99 mg/dL  MRSA PCR Screening     Status: None   Collection Time: 01/23/17  2:57  PM  Result Value Ref Range   MRSA by PCR NEGATIVE NEGATIVE    Comment:        The GeneXpert MRSA Assay (FDA approved for NASAL specimens only), is one component of a comprehensive MRSA colonization surveillance program. It is not intended to diagnose MRSA infection nor to guide or monitor treatment for MRSA infections.   Troponin I     Status: Abnormal   Collection Time: 01/23/17  3:09 PM  Result Value Ref Range   Troponin I 62.91 (HH) <0.03 ng/mL    Comment: CRITICAL VALUE NOTED. VALUE IS CONSISTENT WITH PREVIOUSLY REPORTED/CALLED VALUE BY CAF   Glucose, capillary     Status: Abnormal   Collection Time: 01/23/17  5:07 PM  Result Value Ref Range   Glucose-Capillary 129 (H) 65 - 99 mg/dL  Troponin I     Status: Abnormal   Collection Time: 01/23/17  8:57 PM  Result Value Ref Range   Troponin I 53.26 (HH) <0.03 ng/mL    Comment: CRITICAL VALUE NOTED. VALUE IS CONSISTENT WITH PREVIOUSLY REPORTED/CALLED VALUE BY CAF   Heparin level (unfractionated)     Status: Abnormal   Collection Time: 01/23/17  8:57 PM  Result Value Ref Range   Heparin Unfractionated <0.10 (L) 0.30 - 0.70 IU/mL    Comment:        IF HEPARIN RESULTS ARE BELOW EXPECTED VALUES, AND PATIENT DOSAGE HAS BEEN CONFIRMED, SUGGEST FOLLOW UP TESTING OF ANTITHROMBIN III LEVELS. REPEATED TO VERIFY   Magnesium     Status: None   Collection Time: 01/23/17  8:57 PM  Result Value Ref Range   Magnesium 1.7 1.7 - 2.4 mg/dL  Phosphorus     Status: None   Collection Time: 01/23/17  8:57 PM  Result Value Ref Range   Phosphorus 3.8 2.5 - 4.6 mg/dL  Blood gas, arterial     Status: Abnormal   Collection Time: 01/23/17  9:43 PM  Result Value Ref Range   FIO2 0.28    Delivery systems BILEVEL POSITIVE AIRWAY PRESSURE    Inspiratory PAP 20    Expiratory PAP 10.0    pH, Arterial 7.33 (L) 7.350 - 7.450   pCO2 arterial 37 32.0 - 48.0 mmHg   pO2, Arterial 99 83.0 - 108.0 mmHg   Bicarbonate 19.5 (L) 20.0 - 28.0 mmol/L    Acid-base deficit 5.9 (H) 0.0 - 2.0 mmol/L   O2 Saturation 97.2 %   Patient temperature 37.0    Collection site RIGHT RADIAL    Sample type ARTERIAL DRAW    Allens test (pass/fail) PASS PASS   Mechanical Rate 12   Glucose, capillary     Status: Abnormal   Collection Time: 01/23/17 10:16 PM  Result Value Ref Range   Glucose-Capillary 152 (H) 65 - 99 mg/dL  Miscellaneous [facility] test (send-out)     Status: None   Collection Time: 01/24/17  2:50 AM  Result Value Ref Range   [facility] test code [id number]    [facility] test name [id number]    Source ([facility]) WHOLE BLOOD    Misc [facility] result COMMENT     Comment: (NOTE) Test Ordered: [id number] Hemoglobin A1c Hemoglobin A1c  6.5         [H ] %        BN     Reference Range: 4.8-5.6                                       Prediabetes: 5.7 - 6.4         Diabetes: >6.4         Glycemic control for adults with diabetes: <7.0 Performed At: [facility] [place], [place] [id number] Rush Farmer MD [id number]   TSH     Status: None   Collection Time: 01/24/17  2:53 AM  Result Value Ref Range   TSH 1.784 0.350 - 4.500 uIU/mL    Comment: Performed by a 3rd Generation assay with a functional sensitivity of <=0.01 uIU/mL.  CBC     Status: Abnormal   Collection Time: 01/24/17  2:53 AM  Result Value Ref Range   WBC 7.2 3.8 - 10.6 K/uL   RBC 3.01 (L) 4.40 - 5.90 MIL/uL   Hemoglobin 9.1 (L) 13.0 - 18.0 g/dL   HCT 27.4 (L) 40.0 - 52.0 %   MCV 91.0 80.0 - 100.0 fL   MCH 30.2 26.0 - 34.0 pg   MCHC 33.2 32.0 - 36.0 g/dL   RDW 15.2 (H) 11.5 - 14.5 %   Platelets 178 150 - 440 K/uL  Basic metabolic panel     Status: Abnormal   Collection Time: 01/24/17  2:53 AM  Result Value Ref Range   Sodium 132 (L) 135 - 145 mmol/L   Potassium 3.9 3.5 - 5.1 mmol/L   Chloride 103 101 - 111 mmol/L   CO2 18 (L) 22 - 32 mmol/L   Glucose, Bld 153 (H) 65 - 99 mg/dL   BUN 58 (H) 6 - 20 mg/dL   Creatinine, Ser 5.43 (H) 0.61 - 1.24  mg/dL   Calcium 8.0 (L) 8.9 - 10.3 mg/dL   GFR calc non Af Amer 11 (L) >60 mL/min   GFR calc Af Amer 12 (L) >60 mL/min    Comment: (NOTE) The eGFR has been calculated using the CKD EPI equation. This calculation has not been validated in all clinical situations. eGFR's persistently <60 mL/min signify possible Chronic Kidney Disease.    Anion gap 11 5 - 15  Lipid panel     Status: Abnormal   Collection Time: 01/24/17  2:53 AM  Result Value Ref Range   Cholesterol 83 0 - 200 mg/dL   Triglycerides 160 (H) <150 mg/dL   HDL 26 (L) >40 mg/dL   Total CHOL/HDL Ratio 3.2 RATIO   VLDL 32 0 - 40 mg/dL   LDL Cholesterol 25 0 - 99 mg/dL    Comment:        Total Cholesterol/HDL:CHD Risk Coronary Heart Disease Risk Table                     Men   Women  1/2 Average Risk   3.4   3.3  Average Risk       5.0   4.4  2 X Average Risk   9.6   7.1  3 X Average Risk  23.4   11.0        Use the calculated Patient Ratio above and the CHD Risk Table to determine the patient's CHD Risk.        ATP  III CLASSIFICATION (LDL):  <100     mg/dL   Optimal  100-129  mg/dL   Near or Above                    Optimal  130-159  mg/dL   Borderline  160-189  mg/dL   High  >190     mg/dL   Very High   Troponin I     Status: Abnormal   Collection Time: 01/24/17  2:53 AM  Result Value Ref Range   Troponin I 52.89 (HH) <0.03 ng/mL    Comment: CRITICAL VALUE NOTED. VALUE IS CONSISTENT WITH PREVIOUSLY REPORTED/CALLED VALUE.PMH  Glucose, capillary     Status: Abnormal   Collection Time: 01/24/17  7:31 AM  Result Value Ref Range   Glucose-Capillary 132 (H) 65 - 99 mg/dL  Troponin I     Status: Abnormal   Collection Time: 01/24/17  9:21 AM  Result Value Ref Range   Troponin I 36.73 (HH) <0.03 ng/mL    Comment: CRITICAL RESULT CALLED TO, READ BACK BY AND VERIFIED WITH [facility] OLEJAR AT 1006 01/24/17 [place]   Heparin level (unfractionated)     Status: Abnormal   Collection Time: 01/24/17  9:21 AM  Result Value Ref  Range   Heparin Unfractionated 0.22 (L) 0.30 - 0.70 IU/mL    Comment:        IF HEPARIN RESULTS ARE BELOW EXPECTED VALUES, AND PATIENT DOSAGE HAS BEEN CONFIRMED, SUGGEST FOLLOW UP TESTING OF ANTITHROMBIN III LEVELS.   Norovirus group 1 & 2 by PCR, stool     Status: None   Collection Time: 01/24/17  9:38 AM  Result Value Ref Range   Norovirus 1 by PCR Negative Negative   Norovirus 2  by PCR Negative Negative    Comment: (NOTE) This test was developed and its performance characteristics determined by [facility].  It has not been cleared or approved by the Food and Drug Administration.  The FDA has determined that such clearance or approval is not necessary. Performed At: [facility] [place], [place] [id number] Rush Farmer MD [id number]   Glucose, capillary     Status: Abnormal   Collection Time: 01/24/17 11:11 AM  Result Value Ref Range   Glucose-Capillary 158 (H) 65 - 99 mg/dL  Troponin I     Status: Abnormal   Collection Time: 01/24/17  3:02 PM  Result Value Ref Range   Troponin I 30.22 (HH) <0.03 ng/mL    Comment: CRITICAL VALUE NOTED. VALUE IS CONSISTENT WITH PREVIOUSLY REPORTED/CALLED VALUE JJB  Culture, blood (single) w Reflex to ID Panel     Status: None   Collection Time: 01/24/17  4:34 PM  Result Value Ref Range   Specimen Description BLOOD BLOOD RIGHT HAND    Special Requests      BOTTLES DRAWN AEROBIC AND ANAEROBIC Blood Culture adequate volume   Culture NO GROWTH 5 DAYS    Report Status 01/29/2017 FINAL   Glucose, capillary     Status: Abnormal   Collection Time: 01/24/17  4:34 PM  Result Value Ref Range   Glucose-Capillary 179 (H) 65 - 99 mg/dL   Comment 1 Document in Chart   Cath Tip Culture     Status: None (Preliminary result)   Collection Time: 01/24/17  4:42 PM  Result Value Ref Range   Specimen Description CATH TIP    Special Requests NONE    Culture      >100,000 CFU STAPHYLOCOCCUS AUREUS SUSCEPTIBILITIES  TO  FOLLOW Performed at [facility], [place] [address] [place],  [address]    Report Status PENDING   Glucose, capillary     Status: Abnormal   Collection Time: 01/24/17  9:11 PM  Result Value Ref Range   Glucose-Capillary 156 (H) 65 - 99 mg/dL  Gastrointestinal Panel by PCR , Stool     Status: Abnormal   Collection Time: 01/24/17  9:23 PM  Result Value Ref Range   Campylobacter species NOT DETECTED NOT DETECTED   Plesimonas shigelloides NOT DETECTED NOT DETECTED   Salmonella species NOT DETECTED NOT DETECTED   Yersinia enterocolitica NOT DETECTED NOT DETECTED   Vibrio species NOT DETECTED NOT DETECTED   Vibrio cholerae NOT DETECTED NOT DETECTED   Enteroaggregative E coli (EAEC) NOT DETECTED NOT DETECTED   Enteropathogenic E coli (EPEC) DETECTED (A) NOT DETECTED    Comment: RESULT CALLED TO, READ BACK BY AND VERIFIED WITH: CHELSEA WRENN AT 0011 01/25/17 ALV    Enterotoxigenic E coli (ETEC) NOT DETECTED NOT DETECTED   Shiga like toxin producing E coli (STEC) NOT DETECTED NOT DETECTED   Shigella/Enteroinvasive E coli (EIEC) NOT DETECTED NOT DETECTED   Cryptosporidium NOT DETECTED NOT DETECTED   Cyclospora cayetanensis NOT DETECTED NOT DETECTED   Entamoeba histolytica NOT DETECTED NOT DETECTED   Giardia lamblia NOT DETECTED NOT DETECTED   Adenovirus F40/41 NOT DETECTED NOT DETECTED   Astrovirus NOT DETECTED NOT DETECTED   Norovirus GI/GII NOT DETECTED NOT DETECTED   Rotavirus A NOT DETECTED NOT DETECTED   Sapovirus (I, II, IV, and V) NOT DETECTED NOT DETECTED  Troponin I     Status: Abnormal   Collection Time: 01/24/17 11:59 PM  Result Value Ref Range   Troponin I 19.35 (HH) <0.03 ng/mL    Comment: CRITICAL VALUE NOTED. VALUE IS CONSISTENT WITH PREVIOUSLY REPORTED/CALLED VALUE ALV  Heparin level (unfractionated)     Status: Abnormal   Collection Time: 01/24/17 11:59 PM  Result Value Ref Range   Heparin Unfractionated 0.18 (L) 0.30 - 0.70 IU/mL    Comment:        IF  HEPARIN RESULTS ARE BELOW EXPECTED VALUES, AND PATIENT DOSAGE HAS BEEN CONFIRMED, SUGGEST FOLLOW UP TESTING OF ANTITHROMBIN III LEVELS.   Basic metabolic panel     Status: Abnormal   Collection Time: 01/25/17  5:17 AM  Result Value Ref Range   Sodium 129 (L) 135 - 145 mmol/L   Potassium 3.7 3.5 - 5.1 mmol/L   Chloride 101 101 - 111 mmol/L   CO2 17 (L) 22 - 32 mmol/L   Glucose, Bld 159 (H) 65 - 99 mg/dL   BUN 72 (H) 6 - 20 mg/dL   Creatinine, Ser 6.61 (H) 0.61 - 1.24 mg/dL   Calcium 8.0 (L) 8.9 - 10.3 mg/dL   GFR calc non Af Amer 8 (L) >60 mL/min   GFR calc Af Amer 10 (L) >60 mL/min    Comment: (NOTE) The eGFR has been calculated using the CKD EPI equation. This calculation has not been validated in all clinical situations. eGFR's persistently <60 mL/min signify possible Chronic Kidney Disease.    Anion gap 11 5 - 15  CBC     Status: Abnormal   Collection Time: 01/25/17  5:17 AM  Result Value Ref Range   WBC 7.2 3.8 - 10.6 K/uL   RBC 2.93 (L) 4.40 - 5.90 MIL/uL   Hemoglobin 8.8 (L) 13.0 - 18.0 g/dL   HCT 26.4 (L) 40.0 - 52.0 %  MCV 90.3 80.0 - 100.0 fL   MCH 30.0 26.0 - 34.0 pg   MCHC 33.2 32.0 - 36.0 g/dL   RDW 15.3 (H) 11.5 - 14.5 %   Platelets 161 150 - 440 K/uL  Magnesium     Status: None   Collection Time: 01/25/17  5:17 AM  Result Value Ref Range   Magnesium 2.0 1.7 - 2.4 mg/dL  Phosphorus     Status: Abnormal   Collection Time: 01/25/17  5:17 AM  Result Value Ref Range   Phosphorus 5.3 (H) 2.5 - 4.6 mg/dL  Heparin level (unfractionated)     Status: None   Collection Time: 01/25/17  5:17 AM  Result Value Ref Range   Heparin Unfractionated 0.39 0.30 - 0.70 IU/mL    Comment:        IF HEPARIN RESULTS ARE BELOW EXPECTED VALUES, AND PATIENT DOSAGE HAS BEEN CONFIRMED, SUGGEST FOLLOW UP TESTING OF ANTITHROMBIN III LEVELS.   Glucose, capillary     Status: Abnormal   Collection Time: 01/25/17  7:51 AM  Result Value Ref Range   Glucose-Capillary 159 (H) 65 -  99 mg/dL  Heparin level (unfractionated)     Status: Abnormal   Collection Time: 01/25/17 11:19 AM  Result Value Ref Range   Heparin Unfractionated 0.20 (L) 0.30 - 0.70 IU/mL    Comment:        IF HEPARIN RESULTS ARE BELOW EXPECTED VALUES, AND PATIENT DOSAGE HAS BEEN CONFIRMED, SUGGEST FOLLOW UP TESTING OF ANTITHROMBIN III LEVELS.   ECHOCARDIOGRAM COMPLETE     Status: None   Collection Time: 01/25/17 12:05 PM  Result Value Ref Range   Weight 5,280.46 oz   Height 70 in   BP 104/65 mmHg  Glucose, capillary     Status: Abnormal   Collection Time: 01/25/17 12:20 PM  Result Value Ref Range   Glucose-Capillary 163 (H) 65 - 99 mg/dL  Glucose, capillary     Status: Abnormal   Collection Time: 01/25/17  4:37 PM  Result Value Ref Range   Glucose-Capillary 139 (H) 65 - 99 mg/dL  Glucose, capillary     Status: Abnormal   Collection Time: 01/25/17  5:07 PM  Result Value Ref Range   Glucose-Capillary 137 (H) 65 - 99 mg/dL  Glucose, capillary     Status: Abnormal   Collection Time: 01/25/17  9:13 PM  Result Value Ref Range   Glucose-Capillary 135 (H) 65 - 99 mg/dL  Heparin level (unfractionated)     Status: Abnormal   Collection Time: 01/26/17  1:50 AM  Result Value Ref Range   Heparin Unfractionated 0.11 (L) 0.30 - 0.70 IU/mL    Comment:        IF HEPARIN RESULTS ARE BELOW EXPECTED VALUES, AND PATIENT DOSAGE HAS BEEN CONFIRMED, SUGGEST FOLLOW UP TESTING OF ANTITHROMBIN III LEVELS.   Basic metabolic panel     Status: Abnormal   Collection Time: 01/26/17  3:57 AM  Result Value Ref Range   Sodium 134 (L) 135 - 145 mmol/L   Potassium 3.1 (L) 3.5 - 5.1 mmol/L   Chloride 101 101 - 111 mmol/L   CO2 22 22 - 32 mmol/L   Glucose, Bld 144 (H) 65 - 99 mg/dL   BUN 38 (H) 6 - 20 mg/dL   Creatinine, Ser 4.42 (H) 0.61 - 1.24 mg/dL   Calcium 7.7 (L) 8.9 - 10.3 mg/dL   GFR calc non Af Amer 14 (L) >60 mL/min   GFR calc Af Amer 16 (L) >60  mL/min    Comment: (NOTE) The eGFR has been  calculated using the CKD EPI equation. This calculation has not been validated in all clinical situations. eGFR's persistently <60 mL/min signify possible Chronic Kidney Disease.    Anion gap 11 5 - 15  CBC     Status: Abnormal   Collection Time: 01/26/17  3:57 AM  Result Value Ref Range   WBC 6.4 3.8 - 10.6 K/uL   RBC 2.55 (L) 4.40 - 5.90 MIL/uL   Hemoglobin 7.6 (L) 13.0 - 18.0 g/dL   HCT 22.8 (L) 40.0 - 52.0 %   MCV 89.4 80.0 - 100.0 fL   MCH 29.8 26.0 - 34.0 pg   MCHC 33.3 32.0 - 36.0 g/dL   RDW 15.3 (H) 11.5 - 14.5 %   Platelets 137 (L) 150 - 440 K/uL  Magnesium     Status: None   Collection Time: 01/26/17  3:57 AM  Result Value Ref Range   Magnesium 1.8 1.7 - 2.4 mg/dL  Phosphorus     Status: None   Collection Time: 01/26/17  3:57 AM  Result Value Ref Range   Phosphorus 4.4 2.5 - 4.6 mg/dL  Glucose, capillary     Status: Abnormal   Collection Time: 01/26/17  7:26 AM  Result Value Ref Range   Glucose-Capillary 132 (H) 65 - 99 mg/dL  Heparin level (unfractionated)     Status: Abnormal   Collection Time: 01/26/17  8:30 AM  Result Value Ref Range   Heparin Unfractionated 0.23 (L) 0.30 - 0.70 IU/mL    Comment:        IF HEPARIN RESULTS ARE BELOW EXPECTED VALUES, AND PATIENT DOSAGE HAS BEEN CONFIRMED, SUGGEST FOLLOW UP TESTING OF ANTITHROMBIN III LEVELS.   Glucose, capillary     Status: Abnormal   Collection Time: 01/26/17 11:50 AM  Result Value Ref Range   Glucose-Capillary 171 (H) 65 - 99 mg/dL  Glucose, capillary     Status: Abnormal   Collection Time: 01/26/17  4:14 PM  Result Value Ref Range   Glucose-Capillary 183 (H) 65 - 99 mg/dL  Heparin level (unfractionated)     Status: Abnormal   Collection Time: 01/26/17  7:59 PM  Result Value Ref Range   Heparin Unfractionated 0.24 (L) 0.30 - 0.70 IU/mL    Comment:        IF HEPARIN RESULTS ARE BELOW EXPECTED VALUES, AND PATIENT DOSAGE HAS BEEN CONFIRMED, SUGGEST FOLLOW UP TESTING OF ANTITHROMBIN III LEVELS.    Glucose, capillary     Status: Abnormal   Collection Time: 01/26/17  9:31 PM  Result Value Ref Range   Glucose-Capillary 138 (H) 65 - 99 mg/dL  Basic metabolic panel     Status: Abnormal   Collection Time: 01/27/17  5:16 AM  Result Value Ref Range   Sodium 134 (L) 135 - 145 mmol/L   Potassium 3.1 (L) 3.5 - 5.1 mmol/L   Chloride 100 (L) 101 - 111 mmol/L   CO2 22 22 - 32 mmol/L   Glucose, Bld 134 (H) 65 - 99 mg/dL   BUN 45 (H) 6 - 20 mg/dL   Creatinine, Ser 5.53 (H) 0.61 - 1.24 mg/dL   Calcium 8.1 (L) 8.9 - 10.3 mg/dL   GFR calc non Af Amer 10 (L) >60 mL/min   GFR calc Af Amer 12 (L) >60 mL/min    Comment: (NOTE) The eGFR has been calculated using the CKD EPI equation. This calculation has not been validated in all clinical situations. eGFR's  persistently <60 mL/min signify possible Chronic Kidney Disease.    Anion gap 12 5 - 15  CBC     Status: Abnormal   Collection Time: 01/27/17  5:16 AM  Result Value Ref Range   WBC 8.9 3.8 - 10.6 K/uL   RBC 2.84 (L) 4.40 - 5.90 MIL/uL   Hemoglobin 8.5 (L) 13.0 - 18.0 g/dL   HCT 25.4 (L) 40.0 - 52.0 %   MCV 89.7 80.0 - 100.0 fL   MCH 29.8 26.0 - 34.0 pg   MCHC 33.2 32.0 - 36.0 g/dL   RDW 15.1 (H) 11.5 - 14.5 %   Platelets 157 150 - 440 K/uL  Magnesium     Status: None   Collection Time: 01/27/17  5:16 AM  Result Value Ref Range   Magnesium 1.9 1.7 - 2.4 mg/dL  Phosphorus     Status: Abnormal   Collection Time: 01/27/17  5:16 AM  Result Value Ref Range   Phosphorus 4.9 (H) 2.5 - 4.6 mg/dL  Heparin level (unfractionated)     Status: Abnormal   Collection Time: 01/27/17  5:16 AM  Result Value Ref Range   Heparin Unfractionated 0.25 (L) 0.30 - 0.70 IU/mL    Comment:        IF HEPARIN RESULTS ARE BELOW EXPECTED VALUES, AND PATIENT DOSAGE HAS BEEN CONFIRMED, SUGGEST FOLLOW UP TESTING OF ANTITHROMBIN III LEVELS.   Glucose, capillary     Status: Abnormal   Collection Time: 01/27/17  7:49 AM  Result Value Ref Range    Glucose-Capillary 138 (H) 65 - 99 mg/dL   Comment 1 Notify RN   Glucose, capillary     Status: Abnormal   Collection Time: 01/27/17 11:44 AM  Result Value Ref Range   Glucose-Capillary 128 (H) 65 - 99 mg/dL   Comment 1 Notify RN   Heparin level (unfractionated)     Status: Abnormal   Collection Time: 01/27/17  2:11 PM  Result Value Ref Range   Heparin Unfractionated 0.23 (L) 0.30 - 0.70 IU/mL    Comment:        IF HEPARIN RESULTS ARE BELOW EXPECTED VALUES, AND PATIENT DOSAGE HAS BEEN CONFIRMED, SUGGEST FOLLOW UP TESTING OF ANTITHROMBIN III LEVELS.   Glucose, capillary     Status: Abnormal   Collection Time: 01/27/17  5:07 PM  Result Value Ref Range   Glucose-Capillary 127 (H) 65 - 99 mg/dL  Glucose, capillary     Status: Abnormal   Collection Time: 01/27/17  9:01 PM  Result Value Ref Range   Glucose-Capillary 129 (H) 65 - 99 mg/dL  Heparin level (unfractionated)     Status: None   Collection Time: 01/28/17 12:21 AM  Result Value Ref Range   Heparin Unfractionated 0.33 0.30 - 0.70 IU/mL    Comment:        IF HEPARIN RESULTS ARE BELOW EXPECTED VALUES, AND PATIENT DOSAGE HAS BEEN CONFIRMED, SUGGEST FOLLOW UP TESTING OF ANTITHROMBIN III LEVELS.   Antithrombin III     Status: Abnormal   Collection Time: 01/28/17 12:21 AM  Result Value Ref Range   AntiThromb III Func 62 (L) 75 - 120 %    Comment: Performed at [facility] [address] [place], [place] [address]  Magnesium     Status: None   Collection Time: 01/28/17  6:11 AM  Result Value Ref Range   Magnesium 1.8 1.7 - 2.4 mg/dL  Phosphorus     Status: None   Collection Time: 01/28/17  6:11  AM  Result Value Ref Range   Phosphorus 3.6 2.5 - 4.6 mg/dL  CBC     Status: Abnormal   Collection Time: 01/28/17  6:11 AM  Result Value Ref Range   WBC 10.1 3.8 - 10.6 K/uL   RBC 2.89 (L) 4.40 - 5.90 MIL/uL   Hemoglobin 8.6 (L) 13.0 - 18.0 g/dL   HCT 25.7 (L) 40.0 - 52.0 %   MCV 89.0 80.0 - 100.0 fL   MCH 29.7 26.0 -  34.0 pg   MCHC 33.4 32.0 - 36.0 g/dL   RDW 15.3 (H) 11.5 - 14.5 %   Platelets 190 150 - 440 K/uL  Hepatitis B surface antigen     Status: None   Collection Time: 01/28/17  6:11 AM  Result Value Ref Range   Hepatitis B Surface Ag Negative Negative    Comment: (NOTE) Performed At: [facility] [place], [place] [id number] Rush Farmer MD [id number]   Hepatitis B surface antibody     Status: Abnormal   Collection Time: 01/28/17  6:11 AM  Result Value Ref Range   Hepatitis B-Post 4.3 (L) Immunity>9.9 mIU/mL    Comment: (NOTE)  Status of Immunity                     Anti-HBs Level  ------------------                     -------------- Inconsistent with Immunity                   0.0 - 9.9 Consistent with Immunity                          >9.9 Performed At: [facility] [place], [place] [id number] Rush Farmer MD [id number]   Heparin level (unfractionated)     Status: None   Collection Time: 01/28/17  6:11 AM  Result Value Ref Range   Heparin Unfractionated 0.38 0.30 - 0.70 IU/mL    Comment:        IF HEPARIN RESULTS ARE BELOW EXPECTED VALUES, AND PATIENT DOSAGE HAS BEEN CONFIRMED, SUGGEST FOLLOW UP TESTING OF ANTITHROMBIN III LEVELS.   Glucose, capillary     Status: Abnormal   Collection Time: 01/28/17  8:23 AM  Result Value Ref Range   Glucose-Capillary 129 (H) 65 - 99 mg/dL  Glucose, capillary     Status: Abnormal   Collection Time: 01/28/17  9:01 AM  Result Value Ref Range   Glucose-Capillary 134 (H) 65 - 99 mg/dL   Comment 1 Notify RN   Glucose, capillary     Status: Abnormal   Collection Time: 01/28/17 11:48 AM  Result Value Ref Range   Glucose-Capillary 123 (H) 65 - 99 mg/dL   Comment 1 Notify RN   Glucose, capillary     Status: Abnormal   Collection Time: 01/28/17  5:00 PM  Result Value Ref Range   Glucose-Capillary 155 (H) 65 - 99 mg/dL   Comment 1 Notify RN   Glucose, capillary     Status: Abnormal    Collection Time: 01/28/17  6:10 PM  Result Value Ref Range   Glucose-Capillary 134 (H) 65 - 99 mg/dL  Glucose, capillary     Status: Abnormal   Collection Time: 01/28/17  9:51 PM  Result Value Ref Range   Glucose-Capillary 173 (H) 65 - 99 mg/dL   Comment 1 Notify RN  Magnesium     Status: None   Collection Time: 01/29/17  4:49 AM  Result Value Ref Range   Magnesium 1.9 1.7 - 2.4 mg/dL  Phosphorus     Status: None   Collection Time: 01/29/17  4:49 AM  Result Value Ref Range   Phosphorus 3.5 2.5 - 4.6 mg/dL  Heparin level (unfractionated)     Status: Abnormal   Collection Time: 01/29/17  4:49 AM  Result Value Ref Range   Heparin Unfractionated <0.10 (L) 0.30 - 0.70 IU/mL    Comment:        IF HEPARIN RESULTS ARE BELOW EXPECTED VALUES, AND PATIENT DOSAGE HAS BEEN CONFIRMED, SUGGEST FOLLOW UP TESTING OF ANTITHROMBIN III LEVELS.   CBC     Status: Abnormal   Collection Time: 01/29/17  4:49 AM  Result Value Ref Range   WBC 9.7 3.8 - 10.6 K/uL   RBC 2.93 (L) 4.40 - 5.90 MIL/uL   Hemoglobin 8.8 (L) 13.0 - 18.0 g/dL   HCT 26.3 (L) 40.0 - 52.0 %   MCV 89.6 80.0 - 100.0 fL   MCH 30.0 26.0 - 34.0 pg   MCHC 33.5 32.0 - 36.0 g/dL   RDW 15.6 (H) 11.5 - 14.5 %   Platelets 268 150 - 440 K/uL  Potassium     Status: Abnormal   Collection Time: 01/29/17  4:49 AM  Result Value Ref Range   Potassium 3.2 (L) 3.5 - 5.1 mmol/L  Glucose, capillary     Status: Abnormal   Collection Time: 01/29/17  7:55 AM  Result Value Ref Range   Glucose-Capillary 138 (H) 65 - 99 mg/dL   Comment 1 Notify RN   Glucose, capillary     Status: Abnormal   Collection Time: 01/29/17  9:20 AM  Result Value Ref Range   Glucose-Capillary 137 (H) 65 - 99 mg/dL  Glucose, capillary     Status: Abnormal   Collection Time: 01/29/17  3:46 PM  Result Value Ref Range   Glucose-Capillary 115 (H) 65 - 99 mg/dL   Comment 1 Notify RN   Glucose, capillary     Status: Abnormal   Collection Time: 01/29/17  5:47 PM  Result  Value Ref Range   Glucose-Capillary 123 (H) 65 - 99 mg/dL   Comment 1 Notify RN   Glucose, capillary     Status: Abnormal   Collection Time: 01/29/17  8:59 PM  Result Value Ref Range   Glucose-Capillary 129 (H) 65 - 99 mg/dL  Magnesium     Status: None   Collection Time: 01/30/17  3:49 AM  Result Value Ref Range   Magnesium 1.8 1.7 - 2.4 mg/dL  Phosphorus     Status: Abnormal   Collection Time: 01/30/17  3:49 AM  Result Value Ref Range   Phosphorus 2.4 (L) 2.5 - 4.6 mg/dL  Glucose, capillary     Status: Abnormal   Collection Time: 01/30/17  7:59 AM  Result Value Ref Range   Glucose-Capillary 133 (H) 65 - 99 mg/dL  Glucose, capillary     Status: Abnormal   Collection Time: 01/30/17 12:24 PM  Result Value Ref Range   Glucose-Capillary 161 (H) 65 - 99 mg/dL  Glucose, capillary     Status: Abnormal   Collection Time: 01/30/17  4:54 PM  Result Value Ref Range   Glucose-Capillary 164 (H) 65 - 99 mg/dL   Comment 1 Notify RN   Glucose, capillary     Status: Abnormal   Collection Time: 01/30/17  9:22 PM  Result Value Ref Range   Glucose-Capillary 136 (H) 65 - 99 mg/dL  Magnesium     Status: None   Collection Time: 01/31/17  3:30 AM  Result Value Ref Range   Magnesium 1.9 1.7 - 2.4 mg/dL  Phosphorus     Status: None   Collection Time: 01/31/17  3:30 AM  Result Value Ref Range   Phosphorus 3.6 2.5 - 4.6 mg/dL  Basic metabolic panel     Status: Abnormal   Collection Time: 01/31/17  3:30 AM  Result Value Ref Range   Sodium 138 135 - 145 mmol/L   Potassium 3.3 (L) 3.5 - 5.1 mmol/L   Chloride 101 101 - 111 mmol/L   CO2 26 22 - 32 mmol/L   Glucose, Bld 130 (H) 65 - 99 mg/dL   BUN 23 (H) 6 - 20 mg/dL   Creatinine, Ser 4.34 (H) 0.61 - 1.24 mg/dL   Calcium 8.6 (L) 8.9 - 10.3 mg/dL   GFR calc non Af Amer 14 (L) >60 mL/min   GFR calc Af Amer 16 (L) >60 mL/min    Comment: (NOTE) The eGFR has been calculated using the CKD EPI equation. This calculation has not been validated in all  clinical situations. eGFR's persistently <60 mL/min signify possible Chronic Kidney Disease.    Anion gap 11 5 - 15  Glucose, capillary     Status: Abnormal   Collection Time: 01/31/17  7:40 AM  Result Value Ref Range   Glucose-Capillary 130 (H) 65 - 99 mg/dL  Glucose, capillary     Status: Abnormal   Collection Time: 01/31/17 11:43 AM  Result Value Ref Range   Glucose-Capillary 129 (H) 65 - 99 mg/dL  Glucose, capillary     Status: Abnormal   Collection Time: 01/31/17 12:53 PM  Result Value Ref Range   Glucose-Capillary 129 (H) 65 - 99 mg/dL  CBC     Status: Abnormal   Collection Time: 02/08/17 11:14 PM  Result Value Ref Range   WBC 13.6 (H) 3.8 - 10.6 K/uL   RBC 3.19 (L) 4.40 - 5.90 MIL/uL   Hemoglobin 9.3 (L) 13.0 - 18.0 g/dL   HCT 29.2 (L) 40.0 - 52.0 %   MCV 91.3 80.0 - 100.0 fL   MCH 29.2 26.0 - 34.0 pg   MCHC 31.9 (L) 32.0 - 36.0 g/dL   RDW 15.9 (H) 11.5 - 14.5 %   Platelets 280 150 - 440 K/uL  Basic metabolic panel     Status: Abnormal   Collection Time: 02/08/17 11:14 PM  Result Value Ref Range   Sodium 141 135 - 145 mmol/L   Potassium 3.9 3.5 - 5.1 mmol/L   Chloride 101 101 - 111 mmol/L   CO2 28 22 - 32 mmol/L   Glucose, Bld 158 (H) 65 - 99 mg/dL   BUN 39 (H) 6 - 20 mg/dL   Creatinine, Ser 4.30 (H) 0.61 - 1.24 mg/dL   Calcium 8.4 (L) 8.9 - 10.3 mg/dL   GFR calc non Af Amer 14 (L) >60 mL/min   GFR calc Af Amer 16 (L) >60 mL/min    Comment: (NOTE) The eGFR has been calculated using the CKD EPI equation. This calculation has not been validated in all clinical situations. eGFR's persistently <60 mL/min signify possible Chronic Kidney Disease.    Anion gap 12 5 - 15  Troponin I     Status: Abnormal   Collection Time: 02/08/17 11:14 PM  Result Value Ref Range  Troponin I 0.04 (HH) <0.03 ng/mL    Comment: CRITICAL RESULT CALLED TO, READ BACK BY AND VERIFIED WITH HENRY RIVERA ON 02/09/17 AT 0003 JAG   Brain natriuretic peptide     Status: Abnormal    Collection Time: 02/08/17 11:14 PM  Result Value Ref Range   B Natriuretic Peptide 1,783.0 (H) 0.0 - 100.0 pg/mL  Glucose, capillary     Status: Abnormal   Collection Time: 02/09/17  3:04 AM  Result Value Ref Range   Glucose-Capillary 164 (H) 65 - 99 mg/dL  Basic metabolic panel     Status: Abnormal   Collection Time: 02/09/17  5:47 AM  Result Value Ref Range   Sodium 140 135 - 145 mmol/L   Potassium 4.4 3.5 - 5.1 mmol/L   Chloride 105 101 - 111 mmol/L   CO2 26 22 - 32 mmol/L   Glucose, Bld 172 (H) 65 - 99 mg/dL   BUN 41 (H) 6 - 20 mg/dL   Creatinine, Ser 4.50 (H) 0.61 - 1.24 mg/dL   Calcium 8.4 (L) 8.9 - 10.3 mg/dL   GFR calc non Af Amer 13 (L) >60 mL/min   GFR calc Af Amer 15 (L) >60 mL/min    Comment: (NOTE) The eGFR has been calculated using the CKD EPI equation. This calculation has not been validated in all clinical situations. eGFR's persistently <60 mL/min signify possible Chronic Kidney Disease.    Anion gap 9 5 - 15  CBC     Status: Abnormal   Collection Time: 02/09/17  5:47 AM  Result Value Ref Range   WBC 15.7 (H) 3.8 - 10.6 K/uL   RBC 3.00 (L) 4.40 - 5.90 MIL/uL   Hemoglobin 8.7 (L) 13.0 - 18.0 g/dL   HCT 27.8 (L) 40.0 - 52.0 %   MCV 92.7 80.0 - 100.0 fL   MCH 29.0 26.0 - 34.0 pg   MCHC 31.3 (L) 32.0 - 36.0 g/dL   RDW 16.6 (H) 11.5 - 14.5 %   Platelets 266 150 - 440 K/uL  Glucose, capillary     Status: Abnormal   Collection Time: 02/09/17  7:44 AM  Result Value Ref Range   Glucose-Capillary 151 (H) 65 - 99 mg/dL   Comment 1 Notify RN   Hepatitis B surface antigen     Status: None   Collection Time: 02/09/17  5:39 PM  Result Value Ref Range   Hepatitis B Surface Ag Negative Negative    Comment: (NOTE) Performed At: [facility] [place], [place] [id number] Rush Farmer MD [id number]   Hepatitis B surface antibody     Status: None   Collection Time: 02/09/17  5:39 PM  Result Value Ref Range   Hep B S Ab Non Reactive      Comment: (NOTE)              Non Reactive: Inconsistent with immunity,                            less than 10 mIU/mL              Reactive:     Consistent with immunity,                            greater than 9.9 mIU/mL Performed At: [facility] [name], [place] [id number] Rush Farmer MD ID:[id number]   Hepatitis B core  antibody, total     Status: None   Collection Time: 02/09/17  5:39 PM  Result Value Ref Range   Hep B Core Total Ab Negative Negative    Comment: (NOTE) Performed At: [facility] [address] [place], [place] [id number] Rush Farmer MD [id number]   MRSA PCR Screening     Status: None   Collection Time: 02/09/17  9:20 PM  Result Value Ref Range   MRSA by PCR NEGATIVE NEGATIVE    Comment:        The GeneXpert MRSA Assay (FDA approved for NASAL specimens only), is one component of a comprehensive MRSA colonization surveillance program. It is not intended to diagnose MRSA infection nor to guide or monitor treatment for MRSA infections.   Glucose, capillary     Status: None   Collection Time: 02/09/17  9:24 PM  Result Value Ref Range   Glucose-Capillary 97 65 - 99 mg/dL   Comment 1 Notify RN   Basic metabolic panel     Status: Abnormal   Collection Time: 02/10/17  4:06 AM  Result Value Ref Range   Sodium 140 135 - 145 mmol/L   Potassium 3.7 3.5 - 5.1 mmol/L   Chloride 103 101 - 111 mmol/L   CO2 28 22 - 32 mmol/L   Glucose, Bld 80 65 - 99 mg/dL   BUN 21 (H) 6 - 20 mg/dL   Creatinine, Ser 3.39 (H) 0.61 - 1.24 mg/dL   Calcium 8.2 (L) 8.9 - 10.3 mg/dL   GFR calc non Af Amer 19 (L) >60 mL/min   GFR calc Af Amer 22 (L) >60 mL/min    Comment: (NOTE) The eGFR has been calculated using the CKD EPI equation. This calculation has not been validated in all clinical situations. eGFR's persistently <60 mL/min signify possible Chronic Kidney Disease.    Anion gap 9 5 - 15  Glucose, capillary     Status: None    Collection Time: 02/10/17  7:27 AM  Result Value Ref Range   Glucose-Capillary 92 65 - 99 mg/dL  Glucose, capillary     Status: None   Collection Time: 02/10/17 11:59 AM  Result Value Ref Range   Glucose-Capillary 93 65 - 99 mg/dL  Glucose, capillary     Status: None   Collection Time: 02/10/17  4:39 PM  Result Value Ref Range   Glucose-Capillary 82 65 - 99 mg/dL  Glucose, capillary     Status: Abnormal   Collection Time: 02/10/17 10:18 PM  Result Value Ref Range   Glucose-Capillary 103 (H) 65 - 99 mg/dL   Comment 1 Notify RN   Glucose, capillary     Status: None   Collection Time: 02/11/17  7:27 AM  Result Value Ref Range   Glucose-Capillary 98 65 - 99 mg/dL   Comment 1 Notify RN   Glucose, capillary     Status: Abnormal   Collection Time: 02/11/17 11:38 AM  Result Value Ref Range   Glucose-Capillary 150 (H) 65 - 99 mg/dL   Comment 1 Notify RN     Assessment/Plan:  HTN (hypertension) Represents an underlying cause of renal failure and most individuals. blood pressure control important in reducing the progression of atherosclerotic disease. On appropriate oral medications.   Diabetes mellitus ([place]) Represents an underlying cause of his renal failure. blood glucose control important in reducing the progression of atherosclerotic disease. Also, involved in wound healing. On appropriate medications.   HLD (hyperlipidemia) lipid control important in reducing the progression of  atherosclerotic disease. Continue statin therapy   ESRD on dialysis [facility]) Recommend: The patient's PermCath is working well, but he understands this is not an excellent long-term option for dialysis.  A previous vein mapping showed what appeared to be adequate cephalic vein at the antecubital fossa in his nondominant arm with good arterial waveforms. At this time the patient does not have appropriate extremity access for dialysis  Patient should have a left brachiocephalic AV fistula created.  The  risks, benefits and alternative therapies were reviewed in detail with the patient.  All questions were answered.  The patient agrees to proceed with surgery.        Leotis Pain 04/05/2017, 12:51 PM   This note was created with Dragon medical transcription system.  Any errors from dictation are unintentional.

## 2017-04-05 NOTE — Patient Instructions (Signed)
AV Fistula Placement  Arteriovenous (AV) fistula placement is a surgical procedure to create a connection between a blood vessel that carries blood away from your heart (artery) and a blood vessel that returns blood to your heart (vein). The connection is called a fistula. It is often made in the forearm or upper arm.  You may need this procedure if you are getting hemodialysis treatments for kidney disease. An AV fistula makes your vein larger and stronger over several months. This makes the vein a safe and easy spot to insert the needles that are used for hemodialysis.  Tell a health care provider about:  · Any allergies you have.  · All medicines you are taking, including vitamins, herbs, eye drops, creams, and over-the-counter medicines.  · Any problems you or family members have had with anesthetic medicines.  · Any blood disorders you have.  · Any surgeries you have had.  · Any medical conditions you have.  What are the risks?  Generally, this is a safe procedure. However, problems may occur, including:  · Infection.  · Blood clot (thrombosis).  · Reduced blood flow (stenosis).  · Weakening or ballooning out of the fistula (aneurysm).  · Bleeding.  · Allergic reactions to medicines.  · Nerve damage.  · Swelling near the fistula (lymphedema).  · Weakening of your heart (congestive heart failure).  · Failure of the procedure.    What happens before the procedure?  · Imaging tests of your arm may be done to find the best place for the fistula.  · Ask your health care provider about:  ? Changing or stopping your regular medicines. This is especially important if you are taking diabetes medicines or blood thinners.  ? Taking medicines such as aspirin and ibuprofen. These medicines can thin your blood. Do not take these medicines before your procedure if your health care provider instructs you not to.  · Follow instructions from your health care provider about eating or drinking restrictions.  · You may be given  antibiotic medicine to help prevent infection.  · Ask your health care provider how your surgical site will be marked or identified.  · Plan to have someone take you home after the procedure.  What happens during the procedure?  · To reduce your risk of infection:  ? Your health care team will wash or sanitize their hands.  ? Your skin will be washed with soap.  ? Hair may be removed from the surgical area.  · An IV tube will be started in one of your veins.  · You will be given one or more of the following:  ? A medicine to help you relax (sedative).  ? A medicine to numb the area (local anesthetic).  ? A medicine to make you fall asleep (general anesthetic).  ? A medicine that is injected into an area of your body to numb everything below the injection site (regional anesthetic).  · The fistula site will be cleaned with a germ-killing solution (antiseptic).  · A cut (incision) will be made on the inner side of your arm.  · A vein and an artery will be opened and connected with stitches (sutures).  · The incision will be closed with sutures or clips.  · A bandage (dressing) will be placed over the area.  The procedure may vary among health care providers and hospitals.  What happens after the procedure?  · Your blood pressure, heart rate, breathing rate, and blood oxygen level will be   monitored often until the medicines you were given have worn off.  · Your fistula site will be checked for bleeding or swelling.  · You will be given pain medication as needed.  · Do not drive for 24 hours if you received a sedative.  This information is not intended to replace advice given to you by your health care provider. Make sure you discuss any questions you have with your health care provider.  Document Released: 01/20/2015 Document Revised: 07/17/2015 Document Reviewed: 05/01/2014  Elsevier Interactive Patient Education © 2017 [employer]

## 2017-04-05 NOTE — Assessment & Plan Note (Signed)
Represents an underlying cause of renal failure and most individuals. blood pressure control important in reducing the progression of atherosclerotic disease. On appropriate oral medications.

## 2017-04-05 NOTE — Assessment & Plan Note (Signed)
Recommend: The patient's PermCath is working well, but he understands this is not an excellent long-term option for dialysis.  A previous vein mapping showed what appeared to be adequate cephalic vein at the antecubital fossa in his nondominant arm with good arterial waveforms. At this time the patient does not have appropriate extremity access for dialysis  Patient should have a left brachiocephalic AV fistula created.  The risks, benefits and alternative therapies were reviewed in detail with the patient.  All questions were answered.  The patient agrees to proceed with surgery.

## 2017-04-08 ENCOUNTER — Encounter (INDEPENDENT_AMBULATORY_CARE_PROVIDER_SITE_OTHER): Payer: Self-pay

## 2017-04-11 ENCOUNTER — Other Ambulatory Visit (INDEPENDENT_AMBULATORY_CARE_PROVIDER_SITE_OTHER): Payer: Self-pay | Admitting: Vascular Surgery

## 2017-04-14 ENCOUNTER — Inpatient Hospital Stay: Admission: RE | Admit: 2017-04-14 | Payer: Medicaid Other | Source: Ambulatory Visit

## 2017-04-14 ENCOUNTER — Ambulatory Visit: Payer: Medicaid Other | Admitting: Family

## 2017-04-14 ENCOUNTER — Encounter
Admission: RE | Admit: 2017-04-14 | Discharge: 2017-04-14 | Disposition: A | Discharge status: Home / Self Care | Payer: Medicare Other | Source: Ambulatory Visit | Attending: Vascular Surgery | Admitting: Vascular Surgery

## 2017-04-14 ENCOUNTER — Other Ambulatory Visit: Payer: Self-pay

## 2017-04-14 DIAGNOSIS — Z01818 Encounter for other preprocedural examination: Secondary | ICD-10-CM | POA: Insufficient documentation

## 2017-04-14 DIAGNOSIS — N186 End stage renal disease: Secondary | ICD-10-CM | POA: Diagnosis not present

## 2017-04-14 HISTORY — DX: Acute myocardial infarction, unspecified: I21.9

## 2017-04-14 HISTORY — DX: Sleep apnea, unspecified: G47.30

## 2017-04-14 LAB — BASIC METABOLIC PANEL
Anion gap: 11 (ref 5–15)
BUN: 46 mg/dL — AB (ref 6–20)
CALCIUM: 9 mg/dL (ref 8.9–10.3)
CO2: 25 mmol/L (ref 22–32)
Chloride: 100 mmol/L — ABNORMAL LOW (ref 101–111)
Creatinine, Ser: 4.83 mg/dL — ABNORMAL HIGH (ref 0.61–1.24)
GFR calc Af Amer: 14 mL/min — ABNORMAL LOW (ref >60)
GFR, EST NON AFRICAN AMERICAN: 12 mL/min — AB (ref >60)
Glucose, Bld: 193 mg/dL — ABNORMAL HIGH (ref 65–99)
POTASSIUM: 3.5 mmol/L (ref 3.5–5.1)
SODIUM: 136 mmol/L (ref 135–145)

## 2017-04-14 LAB — TYPE AND SCREEN
ABO/RH(D): A POS
Antibody Screen: NEGATIVE

## 2017-04-14 LAB — CBC WITH DIFFERENTIAL/PLATELET
BASOS ABS: 0.1 10*3/uL (ref 0–0.1)
BASOS PCT: 1 %
EOS ABS: 0.6 10*3/uL (ref 0–0.7)
EOS PCT: 5 %
HCT: 38.9 % — ABNORMAL LOW (ref 40.0–52.0)
Hemoglobin: 12.7 g/dL — ABNORMAL LOW (ref 13.0–18.0)
LYMPHS PCT: 19 %
Lymphs Abs: 2.3 10*3/uL (ref 1.0–3.6)
MCH: 29.1 pg (ref 26.0–34.0)
MCHC: 32.7 g/dL (ref 32.0–36.0)
MCV: 89.2 fL (ref 80.0–100.0)
Monocytes Absolute: 0.8 10*3/uL (ref 0.2–1.0)
Monocytes Relative: 7 %
Neutro Abs: 8.4 10*3/uL — ABNORMAL HIGH (ref 1.4–6.5)
Neutrophils Relative %: 68 %
PLATELETS: 264 10*3/uL (ref 150–440)
RBC: 4.36 MIL/uL — AB (ref 4.40–5.90)
RDW: 16.8 % — ABNORMAL HIGH (ref 11.5–14.5)
WBC: 12.2 10*3/uL — AB (ref 3.8–10.6)

## 2017-04-14 LAB — PROTIME-INR
INR: 1.01
PROTHROMBIN TIME: 13.2 s (ref 11.4–15.2)

## 2017-04-14 LAB — APTT: APTT: 34 s (ref 24–36)

## 2017-04-14 NOTE — Patient Instructions (Addendum)
  Your procedure is scheduled on: Thursday April 21, 2017 Report to Same Day Surgery 2nd floor medical mall ([facility]-take elevator on left to 2nd floor.  Check in with surgery information desk.) To find out your arrival time please call [phone] between 1PM - 3PM on Wednesday April 20, 2017  Remember: Instructions that are not followed completely may result in serious medical risk, up to and including death, or upon the discretion of your surgeon and anesthesiologist your surgery may need to be rescheduled.    _x___ 1. Do not eat food after midnight the night before your procedure. You may drink water up to 2 hours before you are scheduled to arrive at the hospital for your procedure.  Do not drink clear liquids within 2 hours of your scheduled arrival to the hospital.   No gum chewing or hard candies.     __x__ 2. No Alcohol for 24 hours before or after surgery.   __x__3. No Smoking or e-cigarettes for 24 prior to surgery.  Do not use any chewable tobacco products for at least 6 hour prior to surgery   ____  4. Bring all medications with you on the day of surgery if instructed.    __x__ 5. Notify your doctor if there is any change in your medical condition     (cold, fever, infections).   __x__6. On the morning of surgery brush your teeth with toothpaste and water.  You may rinse your mouth with mouth wash if you wish.  Do not swallow any toothpaste or mouthwash.   Do not wear jewelry.  Do not wear lotions, powders, or perfumes. You may wear deodorant.  Do not shave 48 hours prior to surgery. Men may shave face and neck.  Do not bring valuables to the hospital.    [facility] is not responsible for any belongings or valuables.    Leave your suitcase in the car. After surgery it may be brought to your room.  For patients admitted to the hospital, discharge time is determined by your treatment team.    Patients discharged the day of surgery will not be allowed  to drive home.  You will need someone to drive you home and stay with you the night of your procedure.    Please read over the following fact sheets that you were given:   [facility] Preparing for Surgery and or MRSA Information   _x___ Take anti-hypertensive listed below, cardiac, seizure, asthma, anti-reflux and psychiatric medicines. These include:  1. Atorvastatin  2. Carvedilol/Coreg  3. Sertraline/Zoloft  4. Levothyroxine/Synthroid  5. Gabapentin/Neurontin  6. Vitamin C & D  _x___ Use CHG Soap or sage wipes as directed on instruction sheet   _x___ Use inhalers on the day of surgery and bring to hospital day of surgery   _x___ Follow recommendations from Cardiologist, Pulmonologist or PCP.  Stop Aspirin and Ibuprofen today April 14, 2017.  OK to take Tylenol and Celebrex.  _x___ Bring C-Pap to the hospital.

## 2017-04-14 NOTE — Pre-Procedure Instructions (Signed)
CBC, differential, BMP, PT, PTT faxed to Dr. Bunnie Domino office.

## 2017-04-15 NOTE — Pre-Procedure Instructions (Signed)
Met B and CBC results faxed to Dr. Lucky Cowboy for review.

## 2017-04-20 MED ORDER — CEFAZOLIN SODIUM-DEXTROSE 2-4 GM/100ML-% IV SOLN
2.0000 g | INTRAVENOUS | Status: AC
  Administered 2017-04-21: 2 g via INTRAVENOUS

## 2017-04-21 ENCOUNTER — Encounter
Admission: RE | Disposition: A | Discharge status: Home / Self Care | Payer: Self-pay | Source: Ambulatory Visit | Attending: Vascular Surgery

## 2017-04-21 ENCOUNTER — Ambulatory Visit: Payer: Medicare Other | Admitting: Anesthesiology

## 2017-04-21 ENCOUNTER — Encounter: Payer: Self-pay | Admitting: Anesthesiology

## 2017-04-21 ENCOUNTER — Ambulatory Visit
Admission: RE | Admit: 2017-04-21 | Discharge: 2017-04-21 | Disposition: A | Discharge status: Home / Self Care | Payer: Medicare Other | Source: Ambulatory Visit | Attending: Vascular Surgery | Admitting: Vascular Surgery

## 2017-04-21 DIAGNOSIS — E1140 Type 2 diabetes mellitus with diabetic neuropathy, unspecified: Secondary | ICD-10-CM | POA: Diagnosis not present

## 2017-04-21 DIAGNOSIS — Z6841 Body Mass Index (BMI) 40.0 and over, adult: Secondary | ICD-10-CM | POA: Insufficient documentation

## 2017-04-21 DIAGNOSIS — E039 Hypothyroidism, unspecified: Secondary | ICD-10-CM | POA: Insufficient documentation

## 2017-04-21 DIAGNOSIS — Z955 Presence of coronary angioplasty implant and graft: Secondary | ICD-10-CM | POA: Insufficient documentation

## 2017-04-21 DIAGNOSIS — F329 Major depressive disorder, single episode, unspecified: Secondary | ICD-10-CM | POA: Insufficient documentation

## 2017-04-21 DIAGNOSIS — Z992 Dependence on renal dialysis: Secondary | ICD-10-CM | POA: Diagnosis not present

## 2017-04-21 DIAGNOSIS — I132 Hypertensive heart and chronic kidney disease with heart failure and with stage 5 chronic kidney disease, or end stage renal disease: Secondary | ICD-10-CM | POA: Diagnosis present

## 2017-04-21 DIAGNOSIS — I252 Old myocardial infarction: Secondary | ICD-10-CM | POA: Insufficient documentation

## 2017-04-21 DIAGNOSIS — I509 Heart failure, unspecified: Secondary | ICD-10-CM | POA: Insufficient documentation

## 2017-04-21 DIAGNOSIS — E1122 Type 2 diabetes mellitus with diabetic chronic kidney disease: Secondary | ICD-10-CM | POA: Insufficient documentation

## 2017-04-21 DIAGNOSIS — F17210 Nicotine dependence, cigarettes, uncomplicated: Secondary | ICD-10-CM | POA: Insufficient documentation

## 2017-04-21 DIAGNOSIS — Z8249 Family history of ischemic heart disease and other diseases of the circulatory system: Secondary | ICD-10-CM | POA: Insufficient documentation

## 2017-04-21 DIAGNOSIS — Z79899 Other long term (current) drug therapy: Secondary | ICD-10-CM | POA: Insufficient documentation

## 2017-04-21 DIAGNOSIS — G4730 Sleep apnea, unspecified: Secondary | ICD-10-CM | POA: Diagnosis not present

## 2017-04-21 DIAGNOSIS — Z7982 Long term (current) use of aspirin: Secondary | ICD-10-CM | POA: Diagnosis not present

## 2017-04-21 DIAGNOSIS — E7800 Pure hypercholesterolemia, unspecified: Secondary | ICD-10-CM | POA: Insufficient documentation

## 2017-04-21 DIAGNOSIS — N186 End stage renal disease: Secondary | ICD-10-CM

## 2017-04-21 DIAGNOSIS — J449 Chronic obstructive pulmonary disease, unspecified: Secondary | ICD-10-CM | POA: Diagnosis not present

## 2017-04-21 HISTORY — PX: AV FISTULA PLACEMENT: SHX1204

## 2017-04-21 LAB — ABO/RH: ABO/RH(D): A POS

## 2017-04-21 LAB — POCT I-STAT 4, (NA,K, GLUC, HGB,HCT)
GLUCOSE: 177 mg/dL — AB (ref 65–99)
HEMATOCRIT: 39 % (ref 39.0–52.0)
Hemoglobin: 13.3 g/dL (ref 13.0–17.0)
Potassium: 3.5 mmol/L (ref 3.5–5.1)
SODIUM: 138 mmol/L (ref 135–145)

## 2017-04-21 LAB — GLUCOSE, CAPILLARY: GLUCOSE-CAPILLARY: 142 mg/dL — AB (ref 65–99)

## 2017-04-21 LAB — POTASSIUM: Potassium: 3.6 mmol/L (ref 3.5–5.1)

## 2017-04-21 SURGERY — ARTERIOVENOUS (AV) FISTULA CREATION
Anesthesia: General | Laterality: Left

## 2017-04-21 MED ORDER — PROPOFOL 10 MG/ML IV BOLUS
INTRAVENOUS | Status: DC | PRN
  Administered 2017-04-21: 210 mg via INTRAVENOUS
  Administered 2017-04-21: 40 mg via INTRAVENOUS

## 2017-04-21 MED ORDER — CEFAZOLIN SODIUM-DEXTROSE 2-4 GM/100ML-% IV SOLN
INTRAVENOUS | Status: AC
  Filled 2017-04-21: qty 100

## 2017-04-21 MED ORDER — EPHEDRINE SULFATE 50 MG/ML IJ SOLN
INTRAMUSCULAR | Status: AC
  Filled 2017-04-21: qty 1

## 2017-04-21 MED ORDER — CARVEDILOL 3.125 MG PO TABS
3.1250 mg | ORAL_TABLET | Freq: Two times a day (BID) | ORAL | Status: DC
  Filled 2017-04-21: qty 1

## 2017-04-21 MED ORDER — FENTANYL CITRATE (PF) 100 MCG/2ML IJ SOLN
INTRAMUSCULAR | Status: AC
  Filled 2017-04-21: qty 2

## 2017-04-21 MED ORDER — FENTANYL CITRATE (PF) 100 MCG/2ML IJ SOLN
25.0000 ug | INTRAMUSCULAR | Status: DC | PRN
  Administered 2017-04-21 (×3): 50 ug via INTRAVENOUS

## 2017-04-21 MED ORDER — CARVEDILOL 3.125 MG PO TABS
3.1250 mg | ORAL_TABLET | Freq: Once | ORAL | Status: AC
  Administered 2017-04-21: 3.125 mg via ORAL
  Filled 2017-04-21: qty 1

## 2017-04-21 MED ORDER — ONDANSETRON HCL 4 MG/2ML IJ SOLN: 4.0000 mg | Freq: Once | INTRAMUSCULAR | Status: DC | PRN

## 2017-04-21 MED ORDER — OXYCODONE HCL 5 MG PO TABS
5.0000 mg | ORAL_TABLET | Freq: Once | ORAL | Status: AC
  Administered 2017-04-21: 5 mg via ORAL

## 2017-04-21 MED ORDER — FENTANYL CITRATE (PF) 100 MCG/2ML IJ SOLN
INTRAMUSCULAR | Status: DC | PRN
  Administered 2017-04-21: 25 ug via INTRAVENOUS
  Administered 2017-04-21: 50 ug via INTRAVENOUS
  Administered 2017-04-21: 25 ug via INTRAVENOUS

## 2017-04-21 MED ORDER — ONDANSETRON HCL 4 MG/2ML IJ SOLN
INTRAMUSCULAR | Status: AC
  Filled 2017-04-21: qty 2

## 2017-04-21 MED ORDER — ONDANSETRON HCL 4 MG/2ML IJ SOLN
INTRAMUSCULAR | Status: DC | PRN
  Administered 2017-04-21: 4 mg via INTRAVENOUS

## 2017-04-21 MED ORDER — OXYCODONE HCL 5 MG PO TABS
ORAL_TABLET | ORAL | Status: AC
  Filled 2017-04-21: qty 1

## 2017-04-21 MED ORDER — SODIUM CHLORIDE 0.9 % IV SOLN
INTRAVENOUS | Status: DC
  Administered 2017-04-21: 11:00:00 via INTRAVENOUS

## 2017-04-21 MED ORDER — LIDOCAINE HCL (PF) 2 % IJ SOLN
INTRAMUSCULAR | Status: AC
  Filled 2017-04-21: qty 10

## 2017-04-21 MED ORDER — HEPARIN SODIUM (PORCINE) 5000 UNIT/ML IJ SOLN
INTRAMUSCULAR | Status: AC
  Filled 2017-04-21: qty 1

## 2017-04-21 MED ORDER — FENTANYL CITRATE (PF) 100 MCG/2ML IJ SOLN
INTRAMUSCULAR | Status: AC
  Administered 2017-04-21: 50 ug via INTRAVENOUS
  Filled 2017-04-21: qty 2

## 2017-04-21 MED ORDER — HYDROCODONE-ACETAMINOPHEN 5-325 MG PO TABS: 1.0000 | ORAL_TABLET | Freq: Four times a day (QID) | ORAL | 0 refills | Status: DC | PRN

## 2017-04-21 MED ORDER — LIDOCAINE HCL (CARDIAC) 20 MG/ML IV SOLN
INTRAVENOUS | Status: DC | PRN
  Administered 2017-04-21: 100 mg via INTRAVENOUS

## 2017-04-21 MED ORDER — HEPARIN SODIUM (PORCINE) 1000 UNIT/ML IJ SOLN
INTRAMUSCULAR | Status: AC
  Filled 2017-04-21: qty 1

## 2017-04-21 MED ORDER — HEPARIN SODIUM (PORCINE) 1000 UNIT/ML IJ SOLN
INTRAMUSCULAR | Status: DC | PRN
  Administered 2017-04-21: 3000 [IU] via INTRAVENOUS

## 2017-04-21 MED ORDER — DEXTROSE 5 % IV SOLN
INTRAVENOUS | Status: DC | PRN
  Administered 2017-04-21: 25 ug/min via INTRAVENOUS

## 2017-04-21 MED ORDER — PAPAVERINE HCL 30 MG/ML IJ SOLN
INTRAMUSCULAR | Status: AC
  Filled 2017-04-21: qty 2

## 2017-04-21 MED ORDER — SODIUM CHLORIDE 0.9 % IJ SOLN
INTRAMUSCULAR | Status: AC
  Filled 2017-04-21: qty 10

## 2017-04-21 MED ORDER — FAMOTIDINE 20 MG PO TABS
20.0000 mg | ORAL_TABLET | Freq: Once | ORAL | Status: AC
  Administered 2017-04-21: 20 mg via ORAL

## 2017-04-21 MED ORDER — PHENYLEPHRINE HCL 10 MG/ML IJ SOLN
INTRAMUSCULAR | Status: DC | PRN
  Administered 2017-04-21 (×2): 100 ug via INTRAVENOUS

## 2017-04-21 MED ORDER — EPHEDRINE SULFATE 50 MG/ML IJ SOLN
INTRAMUSCULAR | Status: DC | PRN
  Administered 2017-04-21: 10 mg via INTRAVENOUS

## 2017-04-21 MED ORDER — FAMOTIDINE 20 MG PO TABS
ORAL_TABLET | ORAL | Status: AC
  Filled 2017-04-21: qty 1

## 2017-04-21 MED ORDER — PROPOFOL 500 MG/50ML IV EMUL
INTRAVENOUS | Status: AC
  Filled 2017-04-21: qty 50

## 2017-04-21 MED ORDER — PROPOFOL 500 MG/50ML IV EMUL: INTRAVENOUS | Status: DC | PRN

## 2017-04-21 MED ORDER — CHLORHEXIDINE GLUCONATE CLOTH 2 % EX PADS: 6.0000 | MEDICATED_PAD | Freq: Once | CUTANEOUS | Status: DC

## 2017-04-21 SURGICAL SUPPLY — 51 items
BAG DECANTER FOR FLEXI CONT (MISCELLANEOUS) ×3 IMPLANT
BLADE SURG SZ11 CARB STEEL (BLADE) ×3 IMPLANT
BOOT SUTURE AID YELLOW STND (SUTURE) ×3 IMPLANT
BRUSH SCRUB EZ  4% CHG (MISCELLANEOUS) ×2
BRUSH SCRUB EZ 4% CHG (MISCELLANEOUS) ×1 IMPLANT
CANISTER SUCT 1200ML W/VALVE (MISCELLANEOUS) ×3 IMPLANT
CHLORAPREP W/TINT 26ML (MISCELLANEOUS) ×3 IMPLANT
CLIP SPRNG 6MM S-JAW DBL (CLIP) ×3
DERMABOND ADVANCED (GAUZE/BANDAGES/DRESSINGS) ×2
DERMABOND ADVANCED .7 DNX12 (GAUZE/BANDAGES/DRESSINGS) ×1 IMPLANT
ELECT CAUTERY BLADE 6.4 (BLADE) ×3 IMPLANT
ELECT REM PT RETURN 9FT ADLT (ELECTROSURGICAL) ×3
ELECTRODE REM PT RTRN 9FT ADLT (ELECTROSURGICAL) ×1 IMPLANT
GEL ULTRASOUND 20GR AQUASONIC (MISCELLANEOUS) IMPLANT
GLOVE BIO SURGEON STRL SZ7 (GLOVE) ×6 IMPLANT
GLOVE INDICATOR 7.5 STRL GRN (GLOVE) ×3 IMPLANT
GOWN STRL REUS W/ TWL LRG LVL3 (GOWN DISPOSABLE) ×1 IMPLANT
GOWN STRL REUS W/ TWL XL LVL3 (GOWN DISPOSABLE) ×1 IMPLANT
GOWN STRL REUS W/TWL LRG LVL3 (GOWN DISPOSABLE) ×2
GOWN STRL REUS W/TWL XL LVL3 (GOWN DISPOSABLE) ×2
HEMOSTAT SURGICEL 2X3 (HEMOSTASIS) ×3 IMPLANT
IV NS 500ML (IV SOLUTION) ×2
IV NS 500ML BAXH (IV SOLUTION) ×1 IMPLANT
KIT TURNOVER KIT A (KITS) ×3 IMPLANT
LABEL OR SOLS (LABEL) IMPLANT
LOOP RED MAXI  1X406MM (MISCELLANEOUS) ×2
LOOP VESSEL MAXI 1X406 RED (MISCELLANEOUS) ×1 IMPLANT
LOOP VESSEL MINI 0.8X406 BLUE (MISCELLANEOUS) ×1 IMPLANT
LOOPS BLUE MINI 0.8X406MM (MISCELLANEOUS) ×2
NEEDLE FILTER BLUNT 18X 1/2SAF (NEEDLE) ×2
NEEDLE FILTER BLUNT 18X1 1/2 (NEEDLE) ×1 IMPLANT
NEEDLE HYPO 30X.5 LL (NEEDLE) IMPLANT
NS IRRIG 500ML POUR BTL (IV SOLUTION) ×3 IMPLANT
PACK EXTREMITY ARMC (MISCELLANEOUS) ×3 IMPLANT
PAD PREP 24X41 OB/GYN DISP (PERSONAL CARE ITEMS) ×3 IMPLANT
SOLUTION CELL SAVER (CLIP) ×1 IMPLANT
STOCKINETTE STRL 4IN 9604848 (GAUZE/BANDAGES/DRESSINGS) ×3 IMPLANT
SUT MNCRL AB 4-0 PS2 18 (SUTURE) ×3 IMPLANT
SUT PROLENE 6 0 BV (SUTURE) ×12 IMPLANT
SUT SILK 2 0 (SUTURE)
SUT SILK 2-0 18XBRD TIE 12 (SUTURE) IMPLANT
SUT SILK 3 0 (SUTURE) ×2
SUT SILK 3-0 18XBRD TIE 12 (SUTURE) ×1 IMPLANT
SUT SILK 4 0 (SUTURE) ×2
SUT SILK 4-0 18XBRD TIE 12 (SUTURE) ×1 IMPLANT
SUT VIC AB 3-0 SH 27 (SUTURE) ×4
SUT VIC AB 3-0 SH 27X BRD (SUTURE) ×2 IMPLANT
SYR 20CC LL (SYRINGE) ×3 IMPLANT
SYR 3ML LL SCALE MARK (SYRINGE) ×3 IMPLANT
SYR TB 1ML 27GX1/2 LL (SYRINGE) IMPLANT
TOWEL OR 17X26 4PK STRL BLUE (TOWEL DISPOSABLE) IMPLANT

## 2017-04-21 NOTE — Anesthesia Post-op Follow-up Note (Signed)
Anesthesia QCDR form completed.        

## 2017-04-21 NOTE — Transfer of Care (Signed)
Immediate Anesthesia Transfer of Care Note  Patient: Brian [name]  Procedure(s) Performed: ARTERIOVENOUS (AV) FISTULA CREATION ( BRACHIOCEPHALIC ) (Left )  Patient Location: PACU  Anesthesia Type:General  Level of Consciousness: awake, alert  and oriented  Airway & Oxygen Therapy: Patient Spontanous Breathing and Patient connected to face mask oxygen  Post-op Assessment: Report given to RN and Post -op Vital signs reviewed and stable  Post vital signs: Reviewed and stable  Last Vitals:  Vitals:   04/21/17 1327 04/21/17 1328  BP:  116/75  Pulse:  80  Resp:  17  Temp: (P) 37.1 C   SpO2:  100%    Last Pain:  Vitals:   04/21/17 1028  TempSrc: Tympanic         Complications: No apparent anesthesia complications

## 2017-04-21 NOTE — Op Note (Signed)
[facility]   OPERATIVE NOTE   PROCEDURE: Left brachiocephalic arteriovenous fistula placement  PRE-OPERATIVE DIAGNOSIS: 1.  ESRD        POST-OPERATIVE DIAGNOSIS: 1. ESRD       SURGEON: Leotis Pain, MD  ASSISTANT(S): none  ANESTHESIA: general  ESTIMATED BLOOD LOSS: 10 cc  FINDING(S): Adequate cephalic vein for fistula creation  SPECIMEN(S):  none  INDICATIONS:   Brian [name] is a 57 y.o. male who presents with renal failure in need of pemanent dialysis acces.  The patient is scheduled for left arm AVF placement.  The patient is aware the risks include but are not limited to: bleeding, infection, steal syndrome, nerve damage, ischemic monomelic neuropathy, failure to mature, and need for additional procedures.  The patient is aware of the risks of the procedure and elects to proceed forward.  DESCRIPTION: After full informed written consent was obtained from the patient, the patient was brought back to the operating room and placed supine upon the operating table.  Prior to induction, the patient received IV antibiotics.   After obtaining adequate anesthesia, the patient was then prepped and draped in the standard fashion for a left arm access procedure.  I made a curvilinear incision at the level of the antecubital fossa and dissected through the subcutaneous tissue and fascia to gain exposure of the brachial artery.  This was noted to be patent and adequate in size for fistula creation.  This was dissected out proximally and distally and prepared for control with vessel loops .  I then dissected out the cephalic vein.  This was noted to be patent and adequate in size for fistula creation.  I then gave the patient 3000 units of intravenous heparin.  The vein was marked for orientation and the distal segment of the vein was ligated with a  2-0 silk, and the vein was transected.  I then instilled the heparinized saline into the vein and clamped it.  At this  point, I reset my exposure of the brachial artery and pulled up control on the vessel loops.  I made an arteriotomy with a #11 blade, and then I extended the arteriotomy with a Potts scissor.  I injected heparinized saline proximal and distal to this arteriotomy.  The vein was then sewn to the artery in an end-to-side configuration with a running stitch of 6-0 Prolene.  Prior to completing this anastomosis, I allowed the vein and artery to backbleed.  There was no evidence of clot from any vessels.  I completed the anastomosis in the usual fashion and then released all vessel loops and clamps. A single 6-0 Prolene patch suture was used and hemostasis was achieved. There was a palpable  thrill in the venous outflow, and there was a palpable pulse in the artery distal to the anastomosis.  At this point, I irrigated out the surgical wound.  Surgicel was placed. There was no further active bleeding.  The subcutaneous tissue was reapproximated with a running stitch of 3-0 Vicryl.  The skin was then closed with a 4-0 Monocryl suture.  The skin was then cleaned, dried, and reinforced with Dermabond.  The patient tolerated this procedure well and was taken to the recovery room in stable condition  COMPLICATIONS: None  CONDITION: Stable   Leotis Pain    04/21/2017, 1:16 PM  This note was created with Dragon Medical transcription system. Any errors in dictation are purely unintentional.

## 2017-04-21 NOTE — Anesthesia Procedure Notes (Signed)
Procedure Name: LMA Insertion Performed by: Lance Muss, CRNA Pre-anesthesia Checklist: Patient identified, Patient being monitored, Timeout performed, Emergency Drugs available and Suction available Patient Re-evaluated:Patient Re-evaluated prior to induction Oxygen Delivery Method: Circle system utilized Preoxygenation: Pre-oxygenation with 100% oxygen Induction Type: IV induction LMA: LMA inserted LMA Size: 4.5 Tube type: Oral Number of attempts: 2 Placement Confirmation: positive ETCO2 and breath sounds checked- equal and bilateral Tube secured with: Tape Dental Injury: Teeth and Oropharynx as per pre-operative assessment

## 2017-04-21 NOTE — Anesthesia Preprocedure Evaluation (Signed)
Anesthesia Evaluation  Patient identified by MRN, date of birth, ID band Patient awake    Reviewed: Allergy & Precautions, H&P , NPO status , Patient's Chart, lab work & pertinent test results, reviewed documented beta blocker date and time   History of Anesthesia Complications Negative for: history of anesthetic complications  Airway Mallampati: I  TM Distance: >3 FB Neck ROM: full    Dental  (+) Dental Advidsory Given, Poor Dentition, Missing   Pulmonary shortness of breath and with exertion, sleep apnea , COPD,  COPD inhaler, neg recent URI, Current Smoker,           Cardiovascular Exercise Tolerance: Good hypertension, (-) angina+ Past MI and +CHF  (-) CAD, (-) Cardiac Stents and (-) CABG (-) dysrhythmias (-) Valvular Problems/Murmurs     Neuro/Psych PSYCHIATRIC DISORDERS Depression negative neurological ROS     GI/Hepatic negative GI ROS, Neg liver ROS,   Endo/Other  negative endocrine ROSdiabetesHypothyroidism Morbid obesity  Renal/GU ESRF and DialysisRenal disease  negative genitourinary   Musculoskeletal   Abdominal   Peds  Hematology negative hematology ROS (+)   Anesthesia Other Findings Past Medical History: No date: CHF (congestive heart failure) (HCC) No date: COPD (chronic obstructive pulmonary disease) (HCC) No date: Diabetes mellitus type 2 in obese (HCC) No date: DM (diabetes mellitus) type II controlled with renal  manifestation (HCC) No date: Hypercholesteremia No date: Hypertension No date: Morbid obesity with BMI of 45.0-49.9, adult (HCC) No date: Myocardial infarction [facility])     Comment:  2018 after infection of dialysis catheter No date: Neuropathy No date: Pancreatitis, acute No date: Pneumonia No date: Sleep apnea     Comment:  CPAP No date: Spinal stenosis   Reproductive/Obstetrics negative OB ROS                             Anesthesia Physical Anesthesia  Plan  ASA: IV  Anesthesia Plan: General   Post-op Pain Management:    Induction: Intravenous  PONV Risk Score and Plan: 1 and Ondansetron and Dexamethasone  Airway Management Planned: LMA  Additional Equipment:   Intra-op Plan:   Post-operative Plan: Extubation in OR  Informed Consent: I have reviewed the patients History and Physical, chart, labs and discussed the procedure including the risks, benefits and alternatives for the proposed anesthesia with the patient or authorized representative who has indicated his/her understanding and acceptance.   Dental Advisory Given  Plan Discussed with: Anesthesiologist, CRNA and Surgeon  Anesthesia Plan Comments:         Anesthesia Quick Evaluation

## 2017-04-21 NOTE — Discharge Instructions (Signed)

## 2017-04-21 NOTE — H&P (Signed)
[facility] History & Physical Update  The patient was interviewed and re-examined.  The patient's previous History and Physical has been reviewed and is unchanged.  There is no change in the plan of care. We plan to proceed with the scheduled procedure.  Leotis Pain, MD  04/21/2017, 11:50 AM

## 2017-04-22 ENCOUNTER — Telehealth (INDEPENDENT_AMBULATORY_CARE_PROVIDER_SITE_OTHER): Payer: Self-pay

## 2017-04-22 ENCOUNTER — Encounter: Payer: Self-pay | Admitting: Vascular Surgery

## 2017-04-22 NOTE — Anesthesia Postprocedure Evaluation (Signed)
Anesthesia Post Note  Patient: Brian [name]  Procedure(s) Performed: ARTERIOVENOUS (AV) FISTULA CREATION ( BRACHIOCEPHALIC ) (Left )  Patient location during evaluation: PACU Anesthesia Type: General Level of consciousness: awake and alert Pain management: pain level controlled Vital Signs Assessment: post-procedure vital signs reviewed and stable Respiratory status: spontaneous breathing, nonlabored ventilation, respiratory function stable and patient connected to nasal cannula oxygen Cardiovascular status: blood pressure returned to baseline and stable Postop Assessment: no apparent nausea or vomiting Anesthetic complications: no     Last Vitals:  Vitals:   04/21/17 1437 04/21/17 1501  BP: 118/68 102/64  Pulse: 77 76  Resp: 14 16  Temp: 36.7 C   SpO2: 94% 95%    Last Pain:  Vitals:   04/21/17 1437  TempSrc: Temporal  PainSc: 3                  Martha Clan

## 2017-04-22 NOTE — Telephone Encounter (Signed)
Christina from [facility] called to let us know the patient's homehealth was not set up today due to the patient having dialysis and he will be out of town on Saturday. Per the patient they will go out on Monday to set up his homehealth care.

## 2017-05-10 ENCOUNTER — Ambulatory Visit: Payer: Medicaid Other | Admitting: Family

## 2017-05-27 ENCOUNTER — Other Ambulatory Visit (INDEPENDENT_AMBULATORY_CARE_PROVIDER_SITE_OTHER): Payer: Self-pay | Admitting: Vascular Surgery

## 2017-05-27 DIAGNOSIS — N185 Chronic kidney disease, stage 5: Secondary | ICD-10-CM

## 2017-05-31 ENCOUNTER — Ambulatory Visit (INDEPENDENT_AMBULATORY_CARE_PROVIDER_SITE_OTHER): Payer: Medicare Other | Admitting: Vascular Surgery

## 2017-05-31 ENCOUNTER — Encounter (INDEPENDENT_AMBULATORY_CARE_PROVIDER_SITE_OTHER): Payer: Medicare Other

## 2017-06-30 ENCOUNTER — Encounter (INDEPENDENT_AMBULATORY_CARE_PROVIDER_SITE_OTHER): Payer: Medicare Other

## 2017-06-30 ENCOUNTER — Ambulatory Visit (INDEPENDENT_AMBULATORY_CARE_PROVIDER_SITE_OTHER): Payer: Medicare Other | Admitting: Vascular Surgery

## 2017-07-26 ENCOUNTER — Other Ambulatory Visit (INDEPENDENT_AMBULATORY_CARE_PROVIDER_SITE_OTHER): Payer: Self-pay | Admitting: Vascular Surgery

## 2017-07-26 ENCOUNTER — Encounter (INDEPENDENT_AMBULATORY_CARE_PROVIDER_SITE_OTHER): Payer: Self-pay

## 2017-08-02 ENCOUNTER — Encounter
Admission: RE | Disposition: A | Discharge status: Home / Self Care | Payer: Self-pay | Source: Ambulatory Visit | Attending: Vascular Surgery

## 2017-08-02 ENCOUNTER — Ambulatory Visit
Admission: RE | Admit: 2017-08-02 | Discharge: 2017-08-02 | Disposition: A | Discharge status: Home / Self Care | Payer: Medicare Other | Source: Ambulatory Visit | Attending: Vascular Surgery | Admitting: Vascular Surgery

## 2017-08-02 DIAGNOSIS — F17210 Nicotine dependence, cigarettes, uncomplicated: Secondary | ICD-10-CM | POA: Diagnosis not present

## 2017-08-02 DIAGNOSIS — E1140 Type 2 diabetes mellitus with diabetic neuropathy, unspecified: Secondary | ICD-10-CM | POA: Diagnosis not present

## 2017-08-02 DIAGNOSIS — I509 Heart failure, unspecified: Secondary | ICD-10-CM | POA: Insufficient documentation

## 2017-08-02 DIAGNOSIS — E1122 Type 2 diabetes mellitus with diabetic chronic kidney disease: Secondary | ICD-10-CM | POA: Insufficient documentation

## 2017-08-02 DIAGNOSIS — E7800 Pure hypercholesterolemia, unspecified: Secondary | ICD-10-CM | POA: Diagnosis not present

## 2017-08-02 DIAGNOSIS — I252 Old myocardial infarction: Secondary | ICD-10-CM | POA: Diagnosis not present

## 2017-08-02 DIAGNOSIS — G4730 Sleep apnea, unspecified: Secondary | ICD-10-CM | POA: Diagnosis not present

## 2017-08-02 DIAGNOSIS — Z4901 Encounter for fitting and adjustment of extracorporeal dialysis catheter: Secondary | ICD-10-CM | POA: Insufficient documentation

## 2017-08-02 DIAGNOSIS — I132 Hypertensive heart and chronic kidney disease with heart failure and with stage 5 chronic kidney disease, or end stage renal disease: Secondary | ICD-10-CM | POA: Insufficient documentation

## 2017-08-02 DIAGNOSIS — T82868A Thrombosis of vascular prosthetic devices, implants and grafts, initial encounter: Secondary | ICD-10-CM

## 2017-08-02 DIAGNOSIS — J449 Chronic obstructive pulmonary disease, unspecified: Secondary | ICD-10-CM | POA: Insufficient documentation

## 2017-08-02 DIAGNOSIS — Z6841 Body Mass Index (BMI) 40.0 and over, adult: Secondary | ICD-10-CM | POA: Diagnosis not present

## 2017-08-02 DIAGNOSIS — N186 End stage renal disease: Secondary | ICD-10-CM

## 2017-08-02 HISTORY — PX: DIALYSIS/PERMA CATHETER REMOVAL: CATH118289

## 2017-08-02 LAB — GLUCOSE, CAPILLARY: Glucose-Capillary: 111 mg/dL — ABNORMAL HIGH (ref 65–99)

## 2017-08-02 SURGERY — DIALYSIS/PERMA CATHETER REMOVAL
Anesthesia: LOCAL

## 2017-08-02 MED ORDER — LIDOCAINE-EPINEPHRINE (PF) 1 %-1:200000 IJ SOLN
INTRAMUSCULAR | Status: DC | PRN
  Administered 2017-08-02: 20 mL

## 2017-08-02 SURGICAL SUPPLY — 2 items
FORCEPS HALSTEAD CVD 5IN STRL (INSTRUMENTS) ×2 IMPLANT
TRAY LACERAT/PLASTIC (MISCELLANEOUS) ×2 IMPLANT

## 2017-08-02 NOTE — H&P (Signed)
[facility] Admission History & Physical  MRN : [id number]  Brian [name] is a 57 y.o. (04-20-1960) male who presents with chief complaint of No chief complaint on file. Marland Kitchen  History of Present Illness: I am asked to evaluate the patient by the dialysis center. The patient was sent here because they have a nonfunctioning tunneled catheter and a functioning left brachial cephalic fistula.  The patient reports they're not been any problems with any of their dialysis runs. They are reporting good flows with good parameters at dialysis.  Patient denies pain or tenderness overlying the access.  There is no pain with dialysis.  The patient denies hand pain or finger pain consistent with steal syndrome.  No fevers or chills while on dialysis.   No current facility-administered medications for this encounter.     Past Medical History:  Diagnosis Date  . CHF (congestive heart failure) ([place])   . COPD (chronic obstructive pulmonary disease) ([place])   . Diabetes mellitus type 2 in obese ([place])   . DM (diabetes mellitus) type II controlled with renal manifestation ([place])   . Hypercholesteremia   . Hypertension   . Morbid obesity with BMI of 45.0-49.9, adult ([place])   . Myocardial infarction ([place])    2018 after infection of dialysis catheter  . Neuropathy   . Pancreatitis, acute   . Pneumonia   . Sleep apnea    CPAP  . Spinal stenosis     Past Surgical History:  Procedure Laterality Date  . AMPUTATION    . AV FISTULA PLACEMENT Left 04/21/2017   Procedure: ARTERIOVENOUS (AV) FISTULA CREATION ( BRACHIOCEPHALIC );  Surgeon: Algernon Huxley, MD;  Location: [facility];  Service: Vascular;  Laterality: Left;  . CHOLECYSTECTOMY  1998  . DIALYSIS/PERMA CATHETER INSERTION N/A 01/03/2017   Procedure: DIALYSIS/PERMA CATHETER INSERTION;  Surgeon: Algernon Huxley, MD;  Location: [facility];  Service: Cardiovascular;  Laterality: N/A;  . DIALYSIS/PERMA CATHETER INSERTION N/A  01/31/2017   Procedure: DIALYSIS/PERMA CATHETER INSERTION;  Surgeon: Algernon Huxley, MD;  Location: [facility];  Service: Cardiovascular;  Laterality: N/A;  . DIALYSIS/PERMA CATHETER REMOVAL N/A 01/24/2017   Procedure: DIALYSIS/PERMA CATHETER REMOVAL;  Surgeon: Algernon Huxley, MD;  Location: [facility];  Service: Cardiovascular;  Laterality: N/A;  . HERNIA REPAIR     2017 when appendix removed  . LAPAROSCOPIC APPENDECTOMY N/A 01/06/2015   Procedure: APPENDECTOMY LAPAROSCOPIC drainage of peritoneal abcess;  Surgeon: Sherri Rad, MD;  Location: [facility];  Service: General;  Laterality: N/A;  . TEE WITHOUT CARDIOVERSION N/A 01/28/2017   Procedure: TRANSESOPHAGEAL ECHOCARDIOGRAM (TEE);  Surgeon: Corey Skains, MD;  Location: [facility];  Service: Cardiovascular;  Laterality: N/A;    Social History Social History   Tobacco Use  . Smoking status: Current Every Day Smoker    Packs/day: 0.25    Types: Cigarettes  . Smokeless tobacco: Never Used  Substance Use Topics  . Alcohol use: No  . Drug use: No    Family History Family History  Problem Relation Age of Onset  . Hypertension Mother   . Hyperlipidemia Mother   . Heart disease Father   . Heart disease Maternal Grandfather     No family history of bleeding or clotting disorders, autoimmune disease or porphyria  Allergies  Allergen Reactions  . Naproxen Rash     REVIEW OF SYSTEMS (Negative unless checked)  Constitutional: [] Weight loss  [] Fever  [] Chills Cardiac: [] Chest pain   []   Chest pressure   [] Palpitations   [] Shortness of breath when laying flat   [] Shortness of breath at rest   [x] Shortness of breath with exertion. Vascular:  [] Pain in legs with walking   [] Pain in legs at rest   [] Pain in legs when laying flat   [] Claudication   [] Pain in feet when walking  [] Pain in feet at rest  [] Pain in feet when laying flat   [] History of DVT   [] Phlebitis   [] Swelling in legs   [] Varicose veins   [] Non-healing  ulcers Pulmonary:   [] Uses home oxygen   [] Productive cough   [] Hemoptysis   [] Wheeze  [] COPD   [] Asthma Neurologic:  [] Dizziness  [] Blackouts   [] Seizures   [] History of stroke   [] History of TIA  [] Aphasia   [] Temporary blindness   [] Dysphagia   [] Weakness or numbness in arms   [] Weakness or numbness in legs Musculoskeletal:  [] Arthritis   [] Joint swelling   [] Joint pain   [] Low back pain Hematologic:  [] Easy bruising  [] Easy bleeding   [] Hypercoagulable state   [] Anemic  [] Hepatitis Gastrointestinal:  [] Blood in stool   [] Vomiting blood  [] Gastroesophageal reflux/heartburn   [] Difficulty swallowing. Genitourinary:  [x] Chronic kidney disease   [] Difficult urination  [] Frequent urination  [] Burning with urination   [] Blood in urine Skin:  [] Rashes   [] Ulcers   [] Wounds Psychological:  [] History of anxiety   []  History of major depression.  Physical Examination  Vitals:   08/02/17 1256  BP: 108/66  Pulse: 76  Resp: 18  Temp: 99.4 F (37.4 C)  TempSrc: Oral  SpO2: 94%   There is no height or weight on file to calculate BMI. Gen: WD/WN, NAD Head: [place]/AT, No temporalis wasting. Prominent temp pulse not noted. Ear/Nose/Throat: Hearing grossly intact, nares w/o erythema or drainage, oropharynx w/o Erythema/Exudate,  Eyes: Conjunctiva clear, sclera non-icteric Neck: Trachea midline.  No JVD.  Pulmonary:  Good air movement, respirations not labored, no use of accessory muscles.  Cardiac: RRR, normal S1, S2. Vascular: left arm fistula good thrill good bruit; left IJ tunneled cathater Vessel Right Left  Radial Palpable Palpable  Ulnar Not Palpable Not Palpable  Brachial Palpable Palpable  Carotid Palpable Palpable  Gastrointestinal: soft, non-tender/non-distended. No guarding/reflex.  Musculoskeletal: M/S 5/5 throughout.  Extremities without ischemic changes.  No deformity or atrophy.  Neurologic: Sensation grossly intact in extremities.  Symmetrical.  Speech is fluent. Motor exam as  listed above. Psychiatric: Judgment intact, Mood & affect appropriate for pt's clinical situation. Dermatologic: No rashes or ulcers noted.  No cellulitis or open wounds. Lymph : No Cervical, Axillary, or Inguinal lymphadenopathy.   CBC Lab Results  Component Value Date   WBC 12.2 (H) 04/14/2017   HGB 13.3 04/21/2017   HCT 39.0 04/21/2017   MCV 89.2 04/14/2017   PLT 264 04/14/2017    BMET    Component Value Date/Time   NA 138 04/21/2017 1101   NA 136 11/05/2012 0309   K 3.5 04/21/2017 1101   K 3.7 11/05/2012 0309   CL 100 (L) 04/14/2017 1451   CL 105 11/05/2012 0309   CO2 25 04/14/2017 1451   CO2 27 11/05/2012 0309   GLUCOSE 177 (H) 04/21/2017 1101   GLUCOSE 132 (H) 11/05/2012 0309   BUN 46 (H) 04/14/2017 1451   BUN 21 (H) 11/05/2012 0309   CREATININE 4.83 (H) 04/14/2017 1451   CREATININE 0.97 11/05/2012 0309   CALCIUM 9.0 04/14/2017 1451   CALCIUM 8.3 (L) 11/05/2012 0309  GFRNONAA 12 (L) 04/14/2017 1451   GFRNONAA >60 11/05/2012 0309   GFRAA 14 (L) 04/14/2017 1451   GFRAA >60 11/05/2012 0309   CrCl cannot be calculated (Patient's most recent lab result is older than the maximum 21 days allowed.).  COAG Lab Results  Component Value Date   INR 1.01 04/14/2017   INR 1.28 01/23/2017   INR 1.12 01/02/2017    Radiology No results found.  Assessment/Plan 1.  Complication dialysis device with thrombosis AV access:  Patient's left IJ Tunneled catheter is malfunctioning. The patient has an extremity access that is functioning well. Therefore, the patient will undergo removal of the tunneled catheter under local anesthesia.  The risks and benefits were described to the patient.  All questions were answered.  The patient agrees to proceed with angiography and intervention. Potassium will be drawn to ensure that it is an appropriate level prior to performing intervention. 2.  End-stage renal disease requiring hemodialysis:  Patient will continue dialysis therapy without  further interruption if a successful intervention is not achieved then a tunneled catheter will be placed. Dialysis has already been arranged. 3.  Hypertension:  Patient will continue medical management; nephrology is following no changes in oral medications. 4. Diabetes mellitus:  Glucose will be monitored and oral medications been held this morning once the patient has undergone the patient's procedure po intake will be reinitiated and again Accu-Cheks will be used to assess the blood glucose level and treat as needed. The patient will be restarted on the patient's usual hypoglycemic regime 5.  CHF:  EKG will be monitored. Nitrates will be used if needed. The patient's oral cardiac medications will be continued.    Hortencia Pilar, MD  08/02/2017 1:17 PM

## 2017-08-02 NOTE — Op Note (Signed)
  OPERATIVE NOTE   PROCEDURE: 1. Removal of a left IJ tunneled dialysis catheter  PRE-OPERATIVE DIAGNOSIS: Complication of dialysis catheter, End stage renal disease  POST-OPERATIVE DIAGNOSIS: Same  SURGEON: Hortencia Pilar, M.D.  ANESTHESIA: Local anesthetic with 1% lidocaine with epinephrine   ESTIMATED BLOOD LOSS: Minimal   FINDING(S): 1. Catheter intact   SPECIMEN(S):  Catheter  INDICATIONS:   Brian [name] is a 57 y.o. male who presents with functioning left arm access.  The patient has undergone placement of an extremity access which is working and this has been successfully cannulated without difficulty.  therefore is undergoing removal of his tunneled catheter which is no longer needed to avoid septic complications.   DESCRIPTION: After obtaining full informed written consent, the patient was positioned supine. The left IJ catheter and surrounding area is prepped and draped in a sterile fashion. The cuff was localized by palpation and noted to be less than 3 cm from the exit site. After appropriate timeout is called, 1% lidocaine with epinephrine is infiltrated into the surrounding tissues around the cuff. Small transverse incision is created at the exit site with an 11 blade scalpel and the dissection was carried up along the catheter to expose the cuff of the tunneled catheter.  The catheter cuff is then freed from the surrounding attachments and adhesions. Once the catheter has been freed circumferentially it is removed in 1 piece. Light pressure was held at the base of the neck.   Antibiotic ointment and a sterile dressing is applied to the exit site. Patient tolerated procedure well and there were no complications.  COMPLICATIONS: None  CONDITION: Unchanged  Hortencia Pilar, M.D. [facility] Office: [phone]  08/02/2017,2:58 PM

## 2017-10-06 ENCOUNTER — Encounter: Payer: Self-pay | Admitting: Nurse Practitioner

## 2017-10-06 ENCOUNTER — Other Ambulatory Visit: Payer: Self-pay

## 2017-10-06 ENCOUNTER — Ambulatory Visit: Payer: Medicare Other | Attending: Nurse Practitioner | Admitting: Nurse Practitioner

## 2017-10-06 VITALS — BP 114/66 | HR 70 | Temp 98.6°F | Resp 18 | Wt 291.0 lb

## 2017-10-06 DIAGNOSIS — E1122 Type 2 diabetes mellitus with diabetic chronic kidney disease: Secondary | ICD-10-CM | POA: Insufficient documentation

## 2017-10-06 DIAGNOSIS — M25562 Pain in left knee: Secondary | ICD-10-CM

## 2017-10-06 DIAGNOSIS — M79604 Pain in right leg: Secondary | ICD-10-CM

## 2017-10-06 DIAGNOSIS — J449 Chronic obstructive pulmonary disease, unspecified: Secondary | ICD-10-CM | POA: Insufficient documentation

## 2017-10-06 DIAGNOSIS — I252 Old myocardial infarction: Secondary | ICD-10-CM | POA: Diagnosis not present

## 2017-10-06 DIAGNOSIS — M5442 Lumbago with sciatica, left side: Secondary | ICD-10-CM

## 2017-10-06 DIAGNOSIS — E785 Hyperlipidemia, unspecified: Secondary | ICD-10-CM | POA: Insufficient documentation

## 2017-10-06 DIAGNOSIS — Z789 Other specified health status: Secondary | ICD-10-CM | POA: Insufficient documentation

## 2017-10-06 DIAGNOSIS — M5441 Lumbago with sciatica, right side: Secondary | ICD-10-CM | POA: Diagnosis not present

## 2017-10-06 DIAGNOSIS — G8929 Other chronic pain: Secondary | ICD-10-CM

## 2017-10-06 DIAGNOSIS — M25561 Pain in right knee: Secondary | ICD-10-CM | POA: Diagnosis not present

## 2017-10-06 DIAGNOSIS — Z9049 Acquired absence of other specified parts of digestive tract: Secondary | ICD-10-CM | POA: Diagnosis not present

## 2017-10-06 DIAGNOSIS — M25552 Pain in left hip: Secondary | ICD-10-CM

## 2017-10-06 DIAGNOSIS — G4733 Obstructive sleep apnea (adult) (pediatric): Secondary | ICD-10-CM | POA: Insufficient documentation

## 2017-10-06 DIAGNOSIS — Z6841 Body Mass Index (BMI) 40.0 and over, adult: Secondary | ICD-10-CM | POA: Insufficient documentation

## 2017-10-06 DIAGNOSIS — I132 Hypertensive heart and chronic kidney disease with heart failure and with stage 5 chronic kidney disease, or end stage renal disease: Secondary | ICD-10-CM | POA: Diagnosis not present

## 2017-10-06 DIAGNOSIS — M25532 Pain in left wrist: Secondary | ICD-10-CM | POA: Insufficient documentation

## 2017-10-06 DIAGNOSIS — Z79890 Hormone replacement therapy (postmenopausal): Secondary | ICD-10-CM | POA: Diagnosis not present

## 2017-10-06 DIAGNOSIS — Z7982 Long term (current) use of aspirin: Secondary | ICD-10-CM | POA: Diagnosis not present

## 2017-10-06 DIAGNOSIS — F329 Major depressive disorder, single episode, unspecified: Secondary | ICD-10-CM | POA: Diagnosis not present

## 2017-10-06 DIAGNOSIS — M899 Disorder of bone, unspecified: Secondary | ICD-10-CM

## 2017-10-06 DIAGNOSIS — Z79899 Other long term (current) drug therapy: Secondary | ICD-10-CM | POA: Diagnosis not present

## 2017-10-06 DIAGNOSIS — M79605 Pain in left leg: Secondary | ICD-10-CM

## 2017-10-06 DIAGNOSIS — G894 Chronic pain syndrome: Secondary | ICD-10-CM

## 2017-10-06 DIAGNOSIS — Z992 Dependence on renal dialysis: Secondary | ICD-10-CM | POA: Insufficient documentation

## 2017-10-06 DIAGNOSIS — I5032 Chronic diastolic (congestive) heart failure: Secondary | ICD-10-CM | POA: Diagnosis not present

## 2017-10-06 DIAGNOSIS — N186 End stage renal disease: Secondary | ICD-10-CM | POA: Insufficient documentation

## 2017-10-06 NOTE — Patient Instructions (Addendum)
___Make follow up appointment _________________________________________________________________________________________  Appointment Policy Summary  It is our goal and responsibility to provide the medical community with assistance in the evaluation and management of patients with chronic pain. Unfortunately our resources are limited. Because we do not have an unlimited amount of time, or available appointments, we are required to closely monitor and manage their use. The following rules exist to maximize their use:  Patient's responsibilities: 1. Punctuality:  At what time should I arrive? You should be physically present in our office 30 minutes before your scheduled appointment. Your scheduled appointment is with your assigned healthcare provider. However, it takes 5-10 minutes to be "checked-in", and another 15 minutes for the nurses to do the admission. If you arrive to our office at the time you were given for your appointment, you will end up being at least 20-25 minutes late to your appointment with the provider. 2. Tardiness:  What happens if I arrive only a few minutes after my scheduled appointment time? You will need to reschedule your appointment. The cutoff is your appointment time. This is why it is so important that you arrive at least 30 minutes before that appointment. If you have an appointment scheduled for 10:00 AM and you arrive at 10:01, you will be required to reschedule your appointment.  3. Plan ahead:  Always assume that you will encounter traffic on your way in. Plan for it. If you are dependent on a driver, make sure they understand these rules and the need to arrive early. 4. Other appointments and responsibilities:  Avoid scheduling any other appointments before or after your pain clinic appointments.  5. Be prepared:  Write down everything that you need to discuss with your healthcare provider and give this information to the admitting nurse. Write down the medications  that you will need refilled. Bring your pills and bottles (even the empty ones), to all of your appointments, except for those where a procedure is scheduled. 6. No children or pets:  Find someone to take care of them. It is not appropriate to bring them in. 7. Scheduling changes:  We request "advanced notification" of any changes or cancellations. 8. Advanced notification:  Defined as a time period of more than 24 hours prior to the originally scheduled appointment. This allows for the appointment to be offered to other patients. 9. Rescheduling:  When a visit is rescheduled, it will require the cancellation of the original appointment. For this reason they both fall within the category of "Cancellations".  10. Cancellations:  They require advanced notification. Any cancellation less than 24 hours before the  appointment will be recorded as a "No Show". 11. No Show:  Defined as an unkept appointment where the patient failed to notify or declare to the practice their intention or inability to keep the appointment.  Corrective process for repeat offenders:  1. Tardiness: Three (3) episodes of rescheduling due to late arrivals will be recorded as one (1) "No Show". 2. Cancellation or reschedule: Three (3) cancellations or rescheduling will be recorded as one (1) "No Show". 3. "No Shows": Three (3) "No Shows" within a 12 month period will result in discharge from the practice. ____________________________________________________________________________________________  ____________________________________________________________________________________________  Pain Scale  Introduction: The pain score used by this practice is the Verbal Numerical Rating Scale (VNRS-11). This is an 11-point scale. It is for adults and children 10 years or older. There are significant differences in how the pain score is reported, used, and applied. Forget everything you learned in  the past and learn this scoring  system.  General Information: The scale should reflect your current level of pain. Unless you are specifically asked for the level of your worst pain, or your average pain. If you are asked for one of these two, then it should be understood that it is over the past 24 hours.  Basic Activities of Daily Living (ADL): Personal hygiene, dressing, eating, transferring, and using restroom.  Instructions: Most patients tend to report their level of pain as a combination of two factors, their physical pain and their psychosocial pain. This last one is also known as "suffering" and it is reflection of how physical pain affects you socially and psychologically. From now on, report them separately. From this point on, when asked to report your pain level, report only your physical pain. Use the following table for reference.  Pain Clinic Pain Levels (0-5/10)  Pain Level Score  Description  No Pain 0   Mild pain 1 Nagging, annoying, but does not interfere with basic activities of daily living (ADL). Patients are able to eat, bathe, get dressed, toileting (being able to get on and off the toilet and perform personal hygiene functions), transfer (move in and out of bed or a chair without assistance), and maintain continence (able to control bladder and bowel functions). Blood pressure and heart rate are unaffected. A normal heart rate for a healthy adult ranges from 60 to 100 bpm (beats per minute).   Mild to moderate pain 2 Noticeable and distracting. Impossible to hide from other people. More frequent flare-ups. Still possible to adapt and function close to normal. It can be very annoying and may have occasional stronger flare-ups. With discipline, patients may get used to it and adapt.   Moderate pain 3 Interferes significantly with activities of daily living (ADL). It becomes difficult to feed, bathe, get dressed, get on and off the toilet or to perform personal hygiene functions. Difficult to get in and out of  bed or a chair without assistance. Very distracting. With effort, it can be ignored when deeply involved in activities.   Moderately severe pain 4 Impossible to ignore for more than a few minutes. With effort, patients may still be able to manage work or participate in some social activities. Very difficult to concentrate. Signs of autonomic nervous system discharge are evident: dilated pupils (mydriasis); mild sweating (diaphoresis); sleep interference. Heart rate becomes elevated (>115 bpm). Diastolic blood pressure (lower number) rises above 100 mmHg. Patients find relief in laying down and not moving.   Severe pain 5 Intense and extremely unpleasant. Associated with frowning face and frequent crying. Pain overwhelms the senses.  Ability to do any activity or maintain social relationships becomes significantly limited. Conversation becomes difficult. Pacing back and forth is common, as getting into a comfortable position is nearly impossible. Pain wakes you up from deep sleep. Physical signs will be obvious: pupillary dilation; increased sweating; goosebumps; brisk reflexes; cold, clammy hands and feet; nausea, vomiting or dry heaves; loss of appetite; significant sleep disturbance with inability to fall asleep or to remain asleep. When persistent, significant weight loss is observed due to the complete loss of appetite and sleep deprivation.  Blood pressure and heart rate becomes significantly elevated. Caution: If elevated blood pressure triggers a pounding headache associated with blurred vision, then the patient should immediately seek attention at an urgent or emergency care unit, as these may be signs of an impending stroke.    Emergency Department Pain Levels (6-10/10)  Emergency  Room Pain 6 Severely limiting. Requires emergency care and should not be seen or managed at an outpatient pain management facility. Communication becomes difficult and requires great effort. Assistance to reach the  emergency department may be required. Facial flushing and profuse sweating along with potentially dangerous increases in heart rate and blood pressure will be evident.   Distressing pain 7 Self-care is very difficult. Assistance is required to transport, or use restroom. Assistance to reach the emergency department will be required. Tasks requiring coordination, such as bathing and getting dressed become very difficult.   Disabling pain 8 Self-care is no longer possible. At this level, pain is disabling. The individual is unable to do even the most "basic" activities such as walking, eating, bathing, dressing, transferring to a bed, or toileting. Fine motor skills are lost. It is difficult to think clearly.   Incapacitating pain 9 Pain becomes incapacitating. Thought processing is no longer possible. Difficult to remember your own name. Control of movement and coordination are lost.   The worst pain imaginable 10 At this level, most patients pass out from pain. When this level is reached, collapse of the autonomic nervous system occurs, leading to a sudden drop in blood pressure and heart rate. This in turn results in a temporary and dramatic drop in blood flow to the brain, leading to a loss of consciousness. Fainting is one of the body's self defense mechanisms. Passing out puts the brain in a calmed state and causes it to shut down for a while, in order to begin the healing process.    Summary: 1. Refer to this scale when providing [place] with your pain level. 2. Be accurate and careful when reporting your pain level. This will help with your care. 3. Over-reporting your pain level will lead to loss of credibility. 4. Even a level of 1/10 means that there is pain and will be treated at our facility. 5. High, inaccurate reporting will be documented as "Symptom Exaggeration", leading to loss of credibility and suspicions of possible secondary gains such as obtaining more narcotics, or wanting to appear  disabled, for fraudulent reasons. 6. Only pain levels of 5 or below will be seen at our facility. 7. Pain levels of 6 and above will be sent to the Emergency Department and the appointment cancelled. ____________________________________________________________________________________________   BMI Assessment: Estimated body mass index is 42.33 kg/m as calculated from the following:   Height as of 04/21/17: 5\' 10"  (3.329 m).   Weight as of 04/21/17: 295 lb (133.8 kg).  BMI interpretation table: BMI level Category Range association with higher incidence of chronic pain  <18 kg/m2 Underweight   18.5-24.9 kg/m2 Ideal body weight   25-29.9 kg/m2 Overweight Increased incidence by 20%  30-34.9 kg/m2 Obese (Class I) Increased incidence by 68%  35-39.9 kg/m2 Severe obesity (Class II) Increased incidence by 136%  >40 kg/m2 Extreme obesity (Class III) Increased incidence by 254%   BMI Readings from Last 4 Encounters:  04/21/17 42.33 kg/m  04/14/17 42.33 kg/m  04/05/17 42.18 kg/m  02/11/17 45.14 kg/m   Wt Readings from Last 4 Encounters:  04/21/17 295 lb (133.8 kg)  04/14/17 295 lb (133.8 kg)  04/05/17 294 lb (133.4 kg)  02/11/17 (!) 314 lb 9.6 oz (142.7 kg)

## 2017-10-06 NOTE — Progress Notes (Signed)
Patient's Name: Brian [name]  MRN: [id number]  Referring Provider: Martin Majestic, *  DOB: 1960-08-29  PCP: Martin Majestic, FNP  DOS: 10/06/2017  Note by: Dionisio David NP  Service setting: Ambulatory outpatient  Specialty: Interventional Pain Management  Location: [facility] (AMB) Pain Management Facility    Patient type: New Patient    Primary Reason(s) for Visit: Initial Patient Evaluation CC: New Patient (Initial Visit)  HPI  [name] is a 57 y.o. year old, male patient, who comes today for an initial evaluation. He has Neuropathy ([place]); Depression; HLD (hyperlipidemia); HTN (hypertension); Gonalgia; Morbid obesity ([place]); Amputated toe ([place]); Diabetes mellitus ([place]); DM (diabetes mellitus) type II controlled with renal manifestation ([place]); COPD (chronic obstructive pulmonary disease) ([place]); Chronic diastolic heart failure ([place]); Tobacco use; Hyperkalemia; Obstructive sleep apnea; Lymphedema of both lower extremities; Acute on chronic diastolic CHF (congestive heart failure) ([place]); NSTEMI (non-ST elevated myocardial infarction) ([place]); ESRD on dialysis ([place]); Fluid overload; Pharmacologic therapy; Disorder of skeletal system; Problems influencing health status; Chronic bilateral low back pain with bilateral sciatica (Primary Area of Pain) (L>R); Chronic hip pain, left (Secondary Area of Pain); Chronic pain of both lower extremities (Tertiary Area of Pain) (R>L); and Chronic pain of both knees (Fourth Area of Pain) (L>R) on their problem list.. His primarily concern today is the New Patient (Initial Visit)  Pain Assessment: Location: Lower Back Radiating: Radiates from lower back to left hip area Onset: More than a month ago Duration: Chronic pain Quality: Constant, Burning, Aching, Shooting, Sharp, Throbbing, Tingling("Disabling, exhausting, horrible") Severity: 7 /10 (subjective, self-reported pain score)  Note: Reported level is compatible with observation. Clinically the patient  looks like a 2/10 A 2/10 is viewed as "Mild to Moderate" and described as noticeable and distracting. Impossible to hide from other people. More frequent flare-ups. Still possible to adapt and function close to normal. It can be very annoying and may have occasional stronger flare-ups. With discipline, patients may get used to it and adapt. Information on the proper use of the pain scale provided to the patient today. When using our objective Pain Scale, levels between 6 and 10/10 are said to belong in an emergency room, as it progressively worsens from a 6/10, described as severely limiting, requiring emergency care not usually available at an outpatient pain management facility. At a 6/10 level, communication becomes difficult and requires great effort. Assistance to reach the emergency department may be required. Facial flushing and profuse sweating along with potentially dangerous increases in heart rate and blood pressure will be evident. Effect on ADL: "Not able to work because oif the pain, very little I can do around the house because I have to take breaks often, difficult to vaccum" Timing: Constant Modifying factors: Hot packs, cold packs, lying, resting, warm showers BP: 114/66  HR: 70  Onset and Duration: Gradual, Date of onset: 2011 and Present less than 3 months Cause of pain: Unknown Severity: Getting worse, NAS-11 at its worse: 10/10, NAS-11 at its best: 8/10, NAS-11 now: 8/10 and NAS-11 on the average: 8/10 Timing: Morning, Afternoon, Night, During activity or exercise and After a period of immobility Aggravating Factors: Bending, Climbing, Kneeling, Lifiting, Prolonged sitting, Prolonged standing, Squatting, Stooping , Twisting, Walking, Walking uphill, Walking downhill and Working Alleviating Factors: Cold packs, Hot packs, Lying down, Resting and Warm showers or baths Associated Problems: Fatigue, Numbness, Spasms, Weakness and Pain that does not allow patient to sleep Quality of  Pain: Aching, Annoying, Burning, Disabling, Exhausting, Horrible, Pulsating, Sharp, Shooting,  Stabbing, Throbbing, Tingling and Uncomfortable Previous Examinations or Tests: MRI scan, X-rays and Nerve conduction test Previous Treatments: Chiropractic manipulations and TENS   The patient comes into the clinics today for the first time for a chronic pain management evaluation.  According to the patient his primary area of pain in his lower back.  He denies any precipitating factors.  He admits that he has been ongoing however getting worse.  He admits that the right is greater than the left.  He admits that sometimes he has leg pain.  He denies any previous surgery, interventional therapy or physical therapy.  He has been to chiropractor he admits that at the beginning it was effective however he later made the pain worse.  He has had images with a chiropractor.  His second area of pain is in his left hip.  He denies any previous surgery, interventional therapy or physical therapy.  He admits that he has had recent images.  His third area pains in his legs.  He admits that the right is greater than the left.  He has some numbness tingling and weakness.  He admits that the pain goes down the backs of his legs into his knees.  His fourth area of pain is in his knees.  He denies any previous surgery, intervention therapy, physical therapy or recent images.  Today I took the time to provide the patient with information regarding this pain practice. The patient was informed that the practice is divided into two sections: an interventional pain management section, as well as a completely separate and distinct medication management section. I explained that there are procedure days for interventional therapies, and evaluation days for follow-ups and medication management. Because of the amount of documentation required during both, they are kept separated. This means that there is the possibility that he may be  scheduled for a procedure on one day, and medication management the next. I have also informed him that because of staffing and facility limitations, this practice will no longer take patients for medication management only. To illustrate the reasons for this, I gave the patient the example of surgeons, and how inappropriate it would be to refer a patient to his/her care, just to write for the post-surgical antibiotics on a surgery done by a different surgeon.   Because interventional pain management is part of the board-certified specialty for the doctors, the patient was informed that joining this practice means that they are open to any and all interventional therapies. I made it clear that this does not mean that they will be forced to have any procedures done. What this means is that I believe interventional therapies to be essential part of the diagnosis and proper management of chronic pain conditions. Therefore, patients not interested in these interventional alternatives will be better served under the care of a different practitioner.  The patient was also made aware of my Comprehensive Pain Management Safety Guidelines where by joining this practice, they limit all of their nerve blocks and joint injections to those done by our practice, for as long as we are retained to manage their care. Historic Controlled Substance Pharmacotherapy Review  PMP and historical list of controlled substances: Codon/acetaminophen 5/325 mg, oxycodone 5 mg, tramadol 50 mg, oxycodone/acetaminophen 7.5/325 mg, Highest opioid analgesic regimen found: Oxycodone/acetaminophen 7.5/325 mg 1 tablet 4 times daily (fill date 12/11/2012) oxycodone milligrams per day Most recent opioid analgesic: None Current opioid analgesics: None Highest recorded MME/day: 45 mg/day MME/day: 0 mg/day Medications: The patient  did not bring the medication(s) to the appointment, as requested in our "New Patient  Package" Pharmacodynamics: Desired effects: Analgesia: The patient reports >50% benefit. Reported improvement in function: The patient reports medication allows him to accomplish basic ADLs. Clinically meaningful improvement in function (CMIF): Sustained CMIF goals met Perceived effectiveness: Described as relatively effective, allowing for increase in activities of daily living (ADL) Undesirable effects: Side-effects or Adverse reactions: None reported Historical Monitoring: The patient  reports that he does not use drugs. List of all UDS Test(s): No results found for: MDMA, COCAINSCRNUR, PCPSCRNUR, PCPQUANT, CANNABQUANT, THCU, [place] List of all Serum Drug Screening Test(s):  No results found for: AMPHSCRSER, BARBSCRSER, BENZOSCRSER, COCAINSCRSER, PCPSCRSER, PCPQUANT, THCSCRSER, CANNABQUANT, OPIATESCRSER, OXYSCRSER, PROPOXSCRSER Historical Background Evaluation: [place] PDMP: Six (6) year initial data search conducted.             [place] Department of public safety, offender search: Editor, commissioning Information) Non-contributory Risk Assessment Profile: Aberrant behavior: None observed or detected today Risk factors for fatal opioid overdose: caucasian, kidney disease and male gender Fatal overdose hazard ratio (HR): Calculation deferred Non-fatal overdose hazard ratio (HR): Calculation deferred Risk of opioid abuse or dependence: 0.7-3.0% with doses ? 36 MME/day and 6.1-26% with doses ? 120 MME/day. Substance use disorder (SUD) risk level: Pending results of Medical Psychology Evaluation for SUD Opioid risk tool (ORT) (Total Score): 0  ORT Scoring interpretation table:  Score <3 = Low Risk for SUD  Score between 4-7 = Moderate Risk for SUD  Score >8 = High Risk for Opioid Abuse   PHQ-2 Depression Scale:  Total score: 0  PHQ-2 Scoring interpretation table: (Score and probability of major depressive disorder)  Score 0 = No depression  Score 1 = 15.4% Probability  Score 2 = 21.1% Probability  Score  3 = 38.4% Probability  Score 4 = 45.5% Probability  Score 5 = 56.4% Probability  Score 6 = 78.6% Probability   PHQ-9 Depression Scale:  Total score: 0  PHQ-9 Scoring interpretation table:  Score 0-4 = No depression  Score 5-9 = Mild depression  Score 10-14 = Moderate depression  Score 15-19 = Moderately severe depression  Score 20-27 = Severe depression (2.4 times higher risk of SUD and 2.89 times higher risk of overuse)   Pharmacologic Plan: Pending ordered tests and/or consults  Meds  The patient has a current medication list which includes the following prescription(s): albuterol, aspirin, atorvastatin, carvedilol, fluticasone, furosemide, gabapentin, ibuprofen, levothyroxine, lidocaine-prilocaine, sertraline, sevelamer carbonate, vitamin c, and vitamin d (ergocalciferol).  Current Outpatient Medications on File Prior to Visit  Medication Sig  . albuterol (PROVENTIL HFA;VENTOLIN HFA) 108 (90 Base) MCG/ACT inhaler Inhale 2 puffs into the lungs every 4 (four) hours as needed for wheezing or shortness of breath.   Marland Kitchen aspirin EC 81 MG EC tablet Take 1 tablet (81 mg total) by mouth daily.  Marland Kitchen atorvastatin (LIPITOR) 80 MG tablet Take 80 mg daily by mouth.  . carvedilol (COREG) 3.125 MG tablet Take 1 tablet (3.125 mg total) by mouth 2 (two) times daily with a meal.  . fluticasone (FLOVENT HFA) 220 MCG/ACT inhaler Inhale 2 puffs into the lungs 2 (two) times daily.  . furosemide (LASIX) 40 MG tablet Take 40 mg by mouth See admin instructions. Take 40 mg by mouth daily on non dialysis days Tuesday, Thursday, Saturday and Sunday  . gabapentin (NEURONTIN) 300 MG capsule Take 600 mg by mouth 3 (three) times daily.  Marland Kitchen ibuprofen (ADVIL,MOTRIN) 200 MG tablet Take 400 mg by mouth daily  as needed for headache or moderate pain.   Marland Kitchen levothyroxine (SYNTHROID, LEVOTHROID) 50 MCG tablet Take 50 mcg daily before breakfast by mouth.  . lidocaine-prilocaine (EMLA) cream Apply 1 application topically as needed  (for port access).   . sertraline (ZOLOFT) 100 MG tablet Take 100 mg by mouth daily.  . sevelamer carbonate (RENVELA) 800 MG tablet Take 800 mg by mouth 3 (three) times daily with meals.  . vitamin C (ASCORBIC ACID) 500 MG tablet Take 500 mg by mouth daily.  . Vitamin D, Ergocalciferol, (DRISDOL) 50000 units CAPS capsule Take 50,000 Units by mouth every Friday.    No current facility-administered medications on file prior to visit.    Imaging Review   Hip Imaging:  Results for orders placed during the hospital encounter of 12/29/16  DG Hip Unilat  With Pelvis 2-3 Views Right   Narrative CLINICAL DATA:  Initial evaluation for multiple recent falls, acute right hip pain.  EXAM: DG HIP (WITH OR WITHOUT PELVIS) 2-3V RIGHT  COMPARISON:  None.  FINDINGS: No acute fracture or dislocation. Femoral head in normal alignment with the acetabulum. Femoral head height preserved. Bony pelvis intact. SI joints approximated. Mild degenerative osteoarthrosis about the hips bilaterally. Degenerative changes noted within lower lumbar spine.  No acute soft tissue abnormality.  Atherosclerosis noted.  IMPRESSION: 1. No acute osseous abnormality about the right hip. 2. Mild degenerative osteoarthrosis.   Electronically Signed   By: Jeannine Boga M.D.   On: 12/29/2016 17:10    Hip-L DG 2-3 views:  Results for orders placed during the hospital encounter of 12/29/16  DG Hip Unilat W or Wo Pelvis 2-3 Views Left   Narrative CLINICAL DATA:  Initial evaluation for multiple recent falls, pain.  EXAM: DG HIP (WITH OR WITHOUT PELVIS) 2-3V LEFT  COMPARISON:  Prior radiograph from 07/12/2016.  FINDINGS: Left hip in slight internal rotation. No acute fracture dislocation. Femoral head in normal alignment with the acetabulum. Heterotopic calcification adjacent to the greater trochanter is stable. Bony pelvis intact. SI joints approximated. Mild degenerative osteoarthrosis. Degenerative  changes noted within lower lumbar spine.  No acute soft tissue abnormality.  Atherosclerosis.  IMPRESSION: 1. No acute osseous abnormality about the left hip. 2. Mild degenerative osteoarthrosis.   Electronically Signed   By: Jeannine Boga M.D.   On: 12/29/2016 17:12     Note: Available results from prior imaging studies were reviewed.        ROS  Cardiovascular History: Daily Aspirin intake, Heart attack ( Date: 2018) and Weak heart (CHF) Pulmonary or Respiratory History: Shortness of breath, Smoking and Snoring  Neurological History: Abnormal skin sensations (Peripheral Neuropathy) and Curved spine Review of Past Neurological Studies:  Results for orders placed or performed during the hospital encounter of 10/08/14  CT Head Wo Contrast   Narrative   CLINICAL DATA:  Initial valuation for acute dizziness, recent falls.  EXAM: CT HEAD WITHOUT CONTRAST  TECHNIQUE: Contiguous axial images were obtained from the base of the skull through the vertex without intravenous contrast.  COMPARISON:  None.  FINDINGS: There is no acute intracranial hemorrhage or infarct. No mass lesion or midline shift. Gray-white matter differentiation is well maintained. Ventricles are normal in size without evidence of hydrocephalus. CSF containing spaces are within normal limits. No extra-axial fluid collection. Diffuse prominence of the CSF containing spaces is compatible with generalized cerebral atrophy. Patchy hypodensity within the periventricular and deep white matter most consistent with chronic small vessel ischemic disease.  The calvarium is intact.  Orbital soft tissues are within normal limits.  Mild opacity within the right ethmoidal air cells. Paranasal sinuses are otherwise clear. No mastoid effusion.  Scalp soft tissues are unremarkable.  IMPRESSION: 1. No acute intracranial process. 2. Mild age-related atrophy with chronic microvascular  ischemic disease.   Electronically Signed   By: Jeannine Boga M.D.   On: 10/08/2014 21:44    Psychological-Psychiatric History: Prone to panicking Gastrointestinal History: Inflamed pancreas (Pancreatitis) Genitourinary History: Difficulty producing urine (Renal failure) and Peeing blood Hematological History: No reported hematological signs or symptoms such as prolonged bleeding, low or poor functioning platelets, bruising or bleeding easily, hereditary bleeding problems, low energy levels due to low hemoglobin or being anemic Endocrine History: High blood sugar controlled without the use of insulin (NIDDM) Rheumatologic History: No reported rheumatological signs and symptoms such as fatigue, joint pain, tenderness, swelling, redness, heat, stiffness, decreased range of motion, with or without associated rash Musculoskeletal History: Negative for myasthenia gravis, muscular dystrophy, multiple sclerosis or malignant hyperthermia Work History: Disabled  Allergies  Mr. Shonk is allergic to naproxen.  Laboratory Chemistry  Inflammation Markers Lab Results  Component Value Date   ESRSEDRATE 63 (H) 08/23/2012   (CRP: Acute Phase) (ESR: Chronic Phase) Renal Function Markers Lab Results  Component Value Date   BUN 46 (H) 04/14/2017   CREATININE 4.83 (H) 04/14/2017   GFRAA 14 (L) 04/14/2017   GFRNONAA 12 (L) 04/14/2017   Hepatic Function Markers Lab Results  Component Value Date   AST 119 (H) 01/23/2017   ALT 30 01/23/2017   ALBUMIN 3.0 (L) 01/23/2017   ALKPHOS 68 01/23/2017   Electrolytes Lab Results  Component Value Date   NA 138 04/21/2017   K 3.5 04/21/2017   CL 100 (L) 04/14/2017   CALCIUM 9.0 04/14/2017   MG 1.9 01/31/2017   Neuropathy Markers No results found for: [id number] Bone Pathology Markers Lab Results  Component Value Date   ALKPHOS 68 01/23/2017   CALCIUM 9.0 04/14/2017   Coagulation Parameters Lab Results  Component Value Date   INR  1.01 04/14/2017   LABPROT 13.2 04/14/2017   APTT 34 04/14/2017   PLT 264 04/14/2017   Cardiovascular Markers Lab Results  Component Value Date   BNP 1,783.0 (H) 02/08/2017   HGB 13.3 04/21/2017   HCT 39.0 04/21/2017   Note: Lab results reviewed.  PFSH  Drug: Mr. Serpa  reports that he does not use drugs. Alcohol:  reports that he does not drink alcohol. Tobacco:  reports that he has been smoking cigarettes. He has been smoking about 0.25 packs per day. He has never used smokeless tobacco. Medical:  has a past medical history of CHF (congestive heart failure) ([place]), COPD (chronic obstructive pulmonary disease) ([place]), Diabetes mellitus type 2 in obese ([place]), DM (diabetes mellitus) type II controlled with renal manifestation ([place]), Hypercholesteremia, Hypertension, Morbid obesity with BMI of 45.0-49.9, adult ([place]), Myocardial infarction ([place]), Neuropathy, Pancreatitis, acute, Pneumonia, Sleep apnea, and Spinal stenosis. Family: family history includes Heart disease in his father and maternal grandfather; Hyperlipidemia in his mother; Hypertension in his mother.  Past Surgical History:  Procedure Laterality Date  . AMPUTATION    . AV FISTULA PLACEMENT Left 04/21/2017   Procedure: ARTERIOVENOUS (AV) FISTULA CREATION ( BRACHIOCEPHALIC );  Surgeon: Algernon Huxley, MD;  Location: [facility] ORS;  Service: Vascular;  Laterality: Left;  . CHOLECYSTECTOMY  1998  . DIALYSIS/PERMA CATHETER INSERTION N/A 01/03/2017   Procedure: DIALYSIS/PERMA CATHETER INSERTION;  Surgeon: Algernon Huxley, MD;  Location[facility]  INVASIVE CV LAB;  Service: Cardiovascular;  Laterality: N/A;  . DIALYSIS/PERMA CATHETER INSERTION N/A 01/31/2017   Procedure: DIALYSIS/PERMA CATHETER INSERTION;  Surgeon: Algernon Huxley, MD;  Location: [place] CV LAB;  Service: Cardiovascular;  Laterality: N/A;  . DIALYSIS/PERMA CATHETER REMOVAL N/A 01/24/2017   Procedure: DIALYSIS/PERMA CATHETER REMOVAL;  Surgeon: Algernon Huxley, MD;  Location: [place] CV LAB;  Service: Cardiovascular;  Laterality: N/A;  . DIALYSIS/PERMA CATHETER REMOVAL N/A 08/02/2017   Procedure: DIALYSIS/PERMA CATHETER REMOVAL;  Surgeon: Katha Cabal, MD;  Location: [place] CV LAB;  Service: Cardiovascular;  Laterality: N/A;  . HERNIA REPAIR     2017 when appendix removed  . LAPAROSCOPIC APPENDECTOMY N/A 01/06/2015   Procedure: APPENDECTOMY LAPAROSCOPIC drainage of peritoneal abcess;  Surgeon: Sherri Rad, MD;  Location: [facility] ORS;  Service: General;  Laterality: N/A;  . TEE WITHOUT CARDIOVERSION N/A 01/28/2017   Procedure: TRANSESOPHAGEAL ECHOCARDIOGRAM (TEE);  Surgeon: Corey Skains, MD;  Location: [facility] ORS;  Service: Cardiovascular;  Laterality: N/A;   Active Ambulatory Problems    Diagnosis Date Noted  . Neuropathy ([place])   . Depression 01/17/2013  . HLD (hyperlipidemia) 01/17/2013  . HTN (hypertension) 01/17/2013  . Gonalgia 01/17/2013  . Morbid obesity ([place]) 01/17/2013  . Amputated toe ([place]) 01/17/2013  . Diabetes mellitus ([place]) 01/17/2013  . DM (diabetes mellitus) type II controlled with renal manifestation ([place]) 01/05/2015  . COPD (chronic obstructive pulmonary disease) ([place]) 02/08/2016  . Chronic diastolic heart failure ([place]) 03/28/2016  . Tobacco use 03/28/2016  . Hyperkalemia 03/28/2016  . Obstructive sleep apnea 09/13/2016  . Lymphedema of both lower extremities 10/14/2016  . Acute on chronic diastolic CHF (congestive heart failure) ([place]) 12/29/2016  . NSTEMI (non-ST elevated myocardial infarction) ([place]) 01/23/2017  . ESRD on dialysis ([place]) 02/09/2017  . Fluid overload 02/09/2017  . Pharmacologic therapy 10/06/2017  . Disorder of skeletal system 10/06/2017  . Problems influencing health status 10/06/2017  . Chronic bilateral low back pain with bilateral sciatica (Primary Area of Pain) (L>R) 10/06/2017  . Chronic hip pain, left (Secondary Area of Pain) 10/06/2017  . Chronic pain of both lower extremities [facility] Area of Pain)  (R>L) 10/06/2017  . Chronic pain of both knees (Fourth Area of Pain) (L>R) 10/06/2017   Resolved Ambulatory Problems    Diagnosis Date Noted  . Pancreatitis, acute   . Compulsive tobacco user syndrome 01/17/2013  . Acute appendicitis with localized peritonitis 01/05/2015  . Acute congestive heart failure ([place]) 02/26/2016  . Snoring 03/28/2016  . Acute respiratory failure with hypercapnia ([place]) 12/29/2016   Past Medical History:  Diagnosis Date  . CHF (congestive heart failure) ([place])   . Diabetes mellitus type 2 in obese ([place])   . Hypercholesteremia   . Hypertension   . Morbid obesity with BMI of 45.0-49.9, adult ([place])   . Myocardial infarction ([place])   . Pneumonia   . Sleep apnea   . Spinal stenosis    Constitutional Exam  General appearance: Well nourished, well developed, and well hydrated. In no apparent acute distress Vitals:   10/06/17 1251  BP: 114/66  Pulse: 70  Resp: 18  Temp: 98.6 F (37 C)  SpO2: 96%  Weight: 291 lb 0.1 oz (132 kg)   BMI Assessment: Estimated body mass index is 41.76 kg/m as calculated from the following:   Height as of 04/21/17: _0  (1.778 m).   Weight as of this encounter: 291 lb 0.1 oz (132 kg).  BMI interpretation table: BMI level Category Range  association with higher incidence of chronic pain  <18 kg/m2 Underweight   18.5-24.9 kg/m2 Ideal body weight   25-29.9 kg/m2 Overweight Increased incidence by 20%  30-34.9 kg/m2 Obese (Class I) Increased incidence by 68%  35-39.9 kg/m2 Severe obesity (Class II) Increased incidence by 136%  >40 kg/m2 Extreme obesity (Class III) Increased incidence by 254%   BMI Readings from Last 4 Encounters:  10/06/17 41.76 kg/m  04/21/17 42.33 kg/m  04/14/17 42.33 kg/m  04/05/17 42.18 kg/m   Wt Readings from Last 4 Encounters:  10/06/17 291 lb 0.1 oz (132 kg)  04/21/17 295 lb (133.8 kg)  04/14/17 295 lb (133.8 kg)  04/05/17 294 lb (133.4 kg)  Psych/Mental status: Alert, oriented x 3 (person,  place, & time)       Eyes: PERLA Respiratory: No evidence of acute respiratory distress   Lumbar Spine Exam  Inspection: No masses, redness, or swelling Alignment: Symmetrical Functional ROM: Unrestricted ROM      Stability: No instability detected Muscle strength & Tone: Functionally intact Sensory: Unimpaired Palpation: Complains of area being tender to palpation       Provocative Tests: Lumbar Hyperextension and rotation test: Unable to perform due to weakness leg raises positive for 45 degrees on left Patrick's Maneuver: Non-diagnostic                    Gait & Posture Assessment  Ambulation: Incapable of ambulation without assistance Gait:  Posture: Difficulty standing up straight, due to weakness  Lower Extremity Exam    Side: Right lower extremity  Side: Left lower extremity  Inspection: Edema  Inspection: Edema  Functional ROM: Adequate ROM          Functional ROM: Decreased ROM          Muscle strength & Tone: Moderate-to-severe deconditioning  Muscle strength & Tone: Moderate-to-severe deconditioning  Sensory: Unimpaired  Sensory: Unimpaired  Palpation: No palpable anomalies  Palpation: No palpable anomalies   Assessment  Primary Diagnosis & Pertinent Problem List: The primary encounter diagnosis was Chronic bilateral low back pain with bilateral sciatica (Primary Area of Pain) (L>R). Diagnoses of Chronic hip pain, left (Secondary Area of Pain), Chronic pain of both lower extremities (Tertiary Area of Pain) (R>L), Chronic pain of both knees (Fourth Area of Pain) (L>R), Pharmacologic therapy, Disorder of skeletal system, Problems influencing health status, and Chronic pain syndrome were also pertinent to this visit.  Visit Diagnosis: 1. Chronic bilateral low back pain with bilateral sciatica (Primary Area of Pain) (L>R)   2. Chronic hip pain, left (Secondary Area of Pain)   3. Chronic pain of both lower extremities (Tertiary Area of Pain) (R>L)   4. Chronic pain of both  knees (Fourth Area of Pain) (L>R)   5. Pharmacologic therapy   6. Disorder of skeletal system   7. Problems influencing health status   8. Chronic pain syndrome    Plan of Care  Initial treatment plan:  Please be advised that as per protocol, today's visit has been an evaluation only. We have not taken over the patient's controlled substance management.  Problem-specific plan: No problem-specific Assessment & Plan notes found for this encounter.  Ordered Lab-work, Procedure(s), Referral(s), & Consult(s): Orders Placed This Encounter  Procedures  . DG Knee 1-2 Views Left  . DG Knee 1-2 Views Right  . DG Lumbar Spine Complete W/Bend  . Comp. Metabolic Panel (12)  . Magnesium  . Vitamin B12  . Sedimentation rate  . 25-Hydroxyvitamin D Lcms D2+D3  .  C-reactive protein  . Drug Screen 10 W/Conf, Serum   Pharmacotherapy: Medications ordered:  No orders of the defined types were placed in this encounter.  Medications administered during this visit: Clifton James had no medications administered during this visit.   Pharmacotherapy under consideration:  Opioid Analgesics: The patient was informed that there is no guarantee that he would be a candidate for opioid analgesics. The decision will be made following CDC guidelines. This decision will be based on the results of diagnostic studies, as well as Mr. Gibbon risk profile.  Membrane stabilizer: To be determined at a later time Muscle relaxant: To be determined at a later time NSAID: To be determined at a later time Other analgesic(s): To be determined at a later time   Interventional therapies under consideration: Mr. Curt was informed that there is no guarantee that he would be a candidate for interventional therapies. The decision will be based on the results of diagnostic studies, as well as Mr. Coury risk profile.  Possible procedure(s): Diagnostic bilateral lumbar epidural steroid injection Diagnostic bilateral  lumbar facet nerve block Possible bilateral lumbar facet radiofrequency ablation Diagnostic left intra-articular hip injection Diagnostic bilateral knee injections Diagnostic Hyalgan series   Provider-requested follow-up: Return for 2nd Visit, w/ Dr. Dossie Arbour, medical record release.  Future Appointments  Date Time Provider [place]  10/31/2017  7:45 AM Milinda Pointer, MD [facility] None  01/31/2018  2:00 PM [facility] None  01/31/2018  3:00 PM Dew, Erskine Squibb, MD [facility] None    Primary Care Physician: Martin Majestic, FNP Location: [facility] Note by:  Date: 10/06/2017; Time: 5:14 PM  Pain Score Disclaimer: We use the NRS-11 scale. This is a self-reported, subjective measurement of pain severity with only modest accuracy. It is used primarily to identify changes within a particular patient. It must be understood that outpatient pain scales are significantly less accurate that those used for research, where they can be applied under ideal controlled circumstances with minimal exposure to variables. In reality, the score is likely to be a combination of pain intensity and pain affect, where pain affect describes the degree of emotional arousal or changes in action readiness caused by the sensory experience of pain. Factors such as social and work situation, setting, emotional state, anxiety levels, expectation, and prior pain experience may influence pain perception and show large inter-individual differences that may also be affected by time variables.  Patient instructions provided during this appointment: Patient Instructions   ___Make follow up appointment _________________________________________________________________________________________  Appointment Policy Summary  It is our goal and responsibility to provide the medical community with assistance in the evaluation and management of patients with chronic pain. Unfortunately  our resources are limited. Because we do not have an unlimited amount of time, or available appointments, we are required to closely monitor and manage their use. The following rules exist to maximize their use:  Patient's responsibilities: 1. Punctuality:  At what time should I arrive? You should be physically present in our office 30 minutes before your scheduled appointment. Your scheduled appointment is with your assigned healthcare provider. However, it takes 5-10 minutes to be "checked-in", and another 15 minutes for the nurses to do the admission. If you arrive to our office at the time you were given for your appointment, you will end up being at least 20-25 minutes late to your appointment with the provider. 2. Tardiness:  What happens if I arrive only a few minutes after my scheduled  appointment time? You will need to reschedule your appointment. The cutoff is your appointment time. This is why it is so important that you arrive at least 30 minutes before that appointment. If you have an appointment scheduled for 10:00 AM and you arrive at 10:01, you will be required to reschedule your appointment.  3. Plan ahead:  Always assume that you will encounter traffic on your way in. Plan for it. If you are dependent on a driver, make sure they understand these rules and the need to arrive early. 4. Other appointments and responsibilities:  Avoid scheduling any other appointments before or after your pain clinic appointments.  5. Be prepared:  Write down everything that you need to discuss with your healthcare provider and give this information to the admitting nurse. Write down the medications that you will need refilled. Bring your pills and bottles (even the empty ones), to all of your appointments, except for those where a procedure is scheduled. 6. No children or pets:  Find someone to take care of them. It is not appropriate to bring them in. 7. Scheduling changes:  We request "advanced  notification" of any changes or cancellations. 8. Advanced notification:  Defined as a time period of more than 24 hours prior to the originally scheduled appointment. This allows for the appointment to be offered to other patients. 9. Rescheduling:  When a visit is rescheduled, it will require the cancellation of the original appointment. For this reason they both fall within the category of "Cancellations".  10. Cancellations:  They require advanced notification. Any cancellation less than 24 hours before the  appointment will be recorded as a "No Show". 11. No Show:  Defined as an unkept appointment where the patient failed to notify or declare to the practice their intention or inability to keep the appointment.  Corrective process for repeat offenders:  1. Tardiness: Three (3) episodes of rescheduling due to late arrivals will be recorded as one (1) "No Show". 2. Cancellation or reschedule: Three (3) cancellations or rescheduling will be recorded as one (1) "No Show". 3. "No Shows": Three (3) "No Shows" within a 12 month period will result in discharge from the practice. ____________________________________________________________________________________________  ____________________________________________________________________________________________  Pain Scale  Introduction: The pain score used by this practice is the Verbal Numerical Rating Scale (VNRS-11). This is an 11-point scale. It is for adults and children 10 years or older. There are significant differences in how the pain score is reported, used, and applied. Forget everything you learned in the past and learn this scoring system.  General Information: The scale should reflect your current level of pain. Unless you are specifically asked for the level of your worst pain, or your average pain. If you are asked for one of these two, then it should be understood that it is over the past 24 hours.  Basic Activities of Daily  Living (ADL): Personal hygiene, dressing, eating, transferring, and using restroom.  Instructions: Most patients tend to report their level of pain as a combination of two factors, their physical pain and their psychosocial pain. This last one is also known as "suffering" and it is reflection of how physical pain affects you socially and psychologically. From now on, report them separately. From this point on, when asked to report your pain level, report only your physical pain. Use the following table for reference.  Pain Clinic Pain Levels (0-5/10)  Pain Level Score  Description  No Pain 0   Mild pain 1 Nagging, annoying,  but does not interfere with basic activities of daily living (ADL). Patients are able to eat, bathe, get dressed, toileting (being able to get on and off the toilet and perform personal hygiene functions), transfer (move in and out of bed or a chair without assistance), and maintain continence (able to control bladder and bowel functions). Blood pressure and heart rate are unaffected. A normal heart rate for a healthy adult ranges from 60 to 100 bpm (beats per minute).   Mild to moderate pain 2 Noticeable and distracting. Impossible to hide from other people. More frequent flare-ups. Still possible to adapt and function close to normal. It can be very annoying and may have occasional stronger flare-ups. With discipline, patients may get used to it and adapt.   Moderate pain 3 Interferes significantly with activities of daily living (ADL). It becomes difficult to feed, bathe, get dressed, get on and off the toilet or to perform personal hygiene functions. Difficult to get in and out of bed or a chair without assistance. Very distracting. With effort, it can be ignored when deeply involved in activities.   Moderately severe pain 4 Impossible to ignore for more than a few minutes. With effort, patients may still be able to manage work or participate in some social activities. Very  difficult to concentrate. Signs of autonomic nervous system discharge are evident: dilated pupils (mydriasis); mild sweating (diaphoresis); sleep interference. Heart rate becomes elevated (>115 bpm). Diastolic blood pressure (lower number) rises above 100 mmHg. Patients find relief in laying down and not moving.   Severe pain 5 Intense and extremely unpleasant. Associated with frowning face and frequent crying. Pain overwhelms the senses.  Ability to do any activity or maintain social relationships becomes significantly limited. Conversation becomes difficult. Pacing back and forth is common, as getting into a comfortable position is nearly impossible. Pain wakes you up from deep sleep. Physical signs will be obvious: pupillary dilation; increased sweating; goosebumps; brisk reflexes; cold, clammy hands and feet; nausea, vomiting or dry heaves; loss of appetite; significant sleep disturbance with inability to fall asleep or to remain asleep. When persistent, significant weight loss is observed due to the complete loss of appetite and sleep deprivation.  Blood pressure and heart rate becomes significantly elevated. Caution: If elevated blood pressure triggers a pounding headache associated with blurred vision, then the patient should immediately seek attention at an urgent or emergency care unit, as these may be signs of an impending stroke.    Emergency Department Pain Levels (6-10/10)  Emergency Room Pain 6 Severely limiting. Requires emergency care and should not be seen or managed at an outpatient pain management facility. Communication becomes difficult and requires great effort. Assistance to reach the emergency department may be required. Facial flushing and profuse sweating along with potentially dangerous increases in heart rate and blood pressure will be evident.   Distressing pain 7 Self-care is very difficult. Assistance is required to transport, or use restroom. Assistance to reach the  emergency department will be required. Tasks requiring coordination, such as bathing and getting dressed become very difficult.   Disabling pain 8 Self-care is no longer possible. At this level, pain is disabling. The individual is unable to do even the most "basic" activities such as walking, eating, bathing, dressing, transferring to a bed, or toileting. Fine motor skills are lost. It is difficult to think clearly.   Incapacitating pain 9 Pain becomes incapacitating. Thought processing is no longer possible. Difficult to remember your own name. Control of movement and  coordination are lost.   The worst pain imaginable 10 At this level, most patients pass out from pain. When this level is reached, collapse of the autonomic nervous system occurs, leading to a sudden drop in blood pressure and heart rate. This in turn results in a temporary and dramatic drop in blood flow to the brain, leading to a loss of consciousness. Fainting is one of the body's self defense mechanisms. Passing out puts the brain in a calmed state and causes it to shut down for a while, in order to begin the healing process.    Summary: 1. Refer to this scale when providing [place] with your pain level. 2. Be accurate and careful when reporting your pain level. This will help with your care. 3. Over-reporting your pain level will lead to loss of credibility. 4. Even a level of 1/10 means that there is pain and will be treated at our facility. 5. High, inaccurate reporting will be documented as "Symptom Exaggeration", leading to loss of credibility and suspicions of possible secondary gains such as obtaining more narcotics, or wanting to appear disabled, for fraudulent reasons. 6. Only pain levels of 5 or below will be seen at our facility. 7. Pain levels of 6 and above will be sent to the Emergency Department and the appointment  cancelled. ____________________________________________________________________________________________   BMI Assessment: Estimated body mass index is 42.33 kg/m as calculated from the following:   Height as of 04/21/17: _0  (1.778 m).   Weight as of 04/21/17: 295 lb (133.8 kg).  BMI interpretation table: BMI level Category Range association with higher incidence of chronic pain  <18 kg/m2 Underweight   18.5-24.9 kg/m2 Ideal body weight   25-29.9 kg/m2 Overweight Increased incidence by 20%  30-34.9 kg/m2 Obese (Class I) Increased incidence by 68%  35-39.9 kg/m2 Severe obesity (Class II) Increased incidence by 136%  >40 kg/m2 Extreme obesity (Class III) Increased incidence by 254%   BMI Readings from Last 4 Encounters:  04/21/17 42.33 kg/m  04/14/17 42.33 kg/m  04/05/17 42.18 kg/m  02/11/17 45.14 kg/m   Wt Readings from Last 4 Encounters:  04/21/17 295 lb (133.8 kg)  04/14/17 295 lb (133.8 kg)  04/05/17 294 lb (133.4 kg)  02/11/17 (!) 314 lb 9.6 oz (142.7 kg)

## 2017-10-06 NOTE — Progress Notes (Signed)
Safety precautions to be maintained throughout the outpatient stay will include: orient to surroundings, keep bed in low position, maintain call bell within reach at all times, provide assistance with transfer out of bed and ambulation.  

## 2017-10-13 LAB — DRUG SCREEN 10 W/CONF, SERUM
Amphetamines, IA: NEGATIVE ng/mL
BENZODIAZEPINES, IA: NEGATIVE ng/mL
Barbiturates, IA: NEGATIVE ug/mL
COCAINE & METABOLITE, IA: NEGATIVE ng/mL
Methadone, IA: NEGATIVE ng/mL
OPIATES, IA: NEGATIVE ng/mL
OXYCODONES, IA: NEGATIVE ng/mL
Phencyclidine, IA: NEGATIVE ng/mL
Propoxyphene, IA: NEGATIVE ng/mL
THC(Marijuana) Metabolite, IA: NEGATIVE ng/mL

## 2017-10-13 LAB — COMP. METABOLIC PANEL (12)
A/G RATIO: 1.1 — AB (ref 1.2–2.2)
AST: 15 IU/L (ref 0–40)
Albumin: 3.9 g/dL (ref 3.5–5.5)
Alkaline Phosphatase: 90 IU/L (ref 39–117)
BUN / CREAT RATIO: 12 (ref 9–20)
BUN: 57 mg/dL — AB (ref 6–24)
Bilirubin Total: 0.2 mg/dL (ref 0.0–1.2)
CALCIUM: 8.9 mg/dL (ref 8.7–10.2)
CHLORIDE: 103 mmol/L (ref 96–106)
CREATININE: 4.89 mg/dL — AB (ref 0.76–1.27)
GFR calc non Af Amer: 12 mL/min/{1.73_m2} — ABNORMAL LOW (ref >59)
GFR, EST AFRICAN AMERICAN: 14 mL/min/{1.73_m2} — AB (ref >59)
GLUCOSE: 94 mg/dL (ref 65–99)
Globulin, Total: 3.5 g/dL (ref 1.5–4.5)
Potassium: 4.9 mmol/L (ref 3.5–5.2)
Sodium: 141 mmol/L (ref 134–144)
TOTAL PROTEIN: 7.4 g/dL (ref 6.0–8.5)

## 2017-10-13 LAB — SEDIMENTATION RATE: SED RATE: 87 mm/h — AB (ref 0–30)

## 2017-10-13 LAB — VITAMIN B12: Vitamin B-12: 408 pg/mL (ref 232–1245)

## 2017-10-13 LAB — MAGNESIUM: Magnesium: 2.2 mg/dL (ref 1.6–2.3)

## 2017-10-13 LAB — 25-HYDROXY VITAMIN D LCMS D2+D3: 25-Hydroxy, Vitamin D-3: 27 ng/mL

## 2017-10-13 LAB — 25-HYDROXYVITAMIN D LCMS D2+D3
25-HYDROXY, VITAMIN D-2: 1.2 ng/mL
25-HYDROXY, VITAMIN D: 28 ng/mL — AB

## 2017-10-13 LAB — C-REACTIVE PROTEIN: CRP: 10 mg/L (ref 0–10)

## 2017-10-28 ENCOUNTER — Ambulatory Visit
Admission: RE | Admit: 2017-10-28 | Discharge: 2017-10-28 | Disposition: A | Discharge status: Home / Self Care | Payer: Medicare Other | Source: Ambulatory Visit | Attending: Nurse Practitioner | Admitting: Nurse Practitioner

## 2017-10-28 DIAGNOSIS — M5441 Lumbago with sciatica, right side: Secondary | ICD-10-CM

## 2017-10-28 DIAGNOSIS — M170 Bilateral primary osteoarthritis of knee: Secondary | ICD-10-CM | POA: Diagnosis not present

## 2017-10-28 DIAGNOSIS — I700 Atherosclerosis of aorta: Secondary | ICD-10-CM | POA: Insufficient documentation

## 2017-10-28 DIAGNOSIS — M25562 Pain in left knee: Principal | ICD-10-CM

## 2017-10-28 DIAGNOSIS — M25561 Pain in right knee: Principal | ICD-10-CM

## 2017-10-28 DIAGNOSIS — M47816 Spondylosis without myelopathy or radiculopathy, lumbar region: Secondary | ICD-10-CM | POA: Insufficient documentation

## 2017-10-28 DIAGNOSIS — G8929 Other chronic pain: Secondary | ICD-10-CM

## 2017-10-28 DIAGNOSIS — M419 Scoliosis, unspecified: Secondary | ICD-10-CM | POA: Diagnosis not present

## 2017-10-28 DIAGNOSIS — M5442 Lumbago with sciatica, left side: Secondary | ICD-10-CM

## 2017-10-30 NOTE — Progress Notes (Deleted)
Patient's Name: Brian [name]  MRN: [id number]  Referring Provider: Martin Majestic, *  DOB: 09/08/1960  PCP: Martin Majestic, FNP  DOS: 10/31/2017  Note by: Brian Cola, MD  Service setting: Ambulatory outpatient  Specialty: Interventional Pain Management  Location: [facility] (AMB) Pain Management Facility    Patient type: Established   Primary Reason(s) for Visit: Encounter for evaluation before starting new chronic pain management plan of care (Level of risk: moderate) CC: No chief complaint on file.  HPI  Brian [name] is a 57 y.o. year old, male patient, who comes today for a follow-up evaluation to review Brian test results and decide on a treatment plan. He has Neuropathy (Brian [name]); Depression; HLD (hyperlipidemia); HTN (hypertension); Severe obesity (BMI >= 40) (Brian [name]); Amputated toe (Brian [name]); Diabetes mellitus (Brian [name]); DM (diabetes mellitus) type II controlled with renal manifestation (Brian [name]); COPD (chronic obstructive pulmonary disease) (Brian [name]); Chronic diastolic heart failure (Brian [name]); Tobacco use; Hyperkalemia; Obstructive sleep apnea; Lower extremity lymphedema (Bilateral); Acute on chronic diastolic CHF (congestive heart failure) (Brian [name]); NSTEMI (non-ST elevated myocardial infarction) (Brian [name]); ESRD on dialysis (Brian [name]); Fluid overload; Pharmacologic therapy; Disorder of skeletal system; Problems influencing health status; Chronic low back pain (Bilateral) (L>R) w/ sciatica (Bilateral); Chronic hip pain (Secondary Area of Pain) (Left); Chronic lower extremity pain (Tertiary Area of Pain) (Bilateral) (R>L); Chronic knee pain (Fourth Area of Pain) (Bilateral) (L>R); Lumbar Dextroscoliosis; Lumbar facet hypertrophy; Osteoarthritis of facet joint of lumbar spine; Lumbar facet syndrome (Bilateral); Spondylosis without myelopathy or radiculopathy, lumbosacral region; DDD (degenerative disc disease), lumbosacral; Osteoarthritis of knees (Bilateral); Tricompartment osteoarthritis of knee (Left); Chronic low back pain  (Primary Area of Pain) (Bilateral) (R>L); Osteoarthritis of  AC (acromioclavicular) joint (Left); Class 3 obesity with alveolar hypoventilation without serious comorbidity with body mass index (BMI) of 40.0 to 44.9 in adult [facility]); Chronic pain syndrome; Abnormal MRI, lumbar spine (06/26/2014); 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 His primarily concern today is Brian No chief complaint on file.  Pain Assessment: Location:     Radiating:   Onset:   Duration:   Quality:   Severity:  /10 (subjective, self-reported pain score)  Note: Reported level is compatible with observation.                         When using our objective Pain Scale, levels between 6 and 10/10 are said to belong in an emergency room, as it progressively worsens from a 6/10, described as severely limiting, requiring emergency care not usually available at an outpatient pain management facility. At a 6/10 level, communication becomes difficult and requires great effort. Assistance to reach Brian emergency department may be required. Facial flushing and profuse sweating along with potentially dangerous increases in heart rate and blood pressure will be evident. Effect on ADL:   Timing:   Modifying factors:   BP:    HR:    Brian [name] comes in today for a follow-up visit after his initial evaluation on 10/06/2017. Today we went over Brian results of his tests. These were explained in "Layman's terms". During today's appointment we went over my diagnostic impression, as well as Brian proposed treatment plan.  According to Brian patient his primary area of pain in his lower back.  He denies any precipitating factors.  He admits  that he has been ongoing however  getting worse.  He admits that Brian right is greater than Brian left.  He admits that sometimes he has leg pain.  He denies any previous surgery, interventional therapy or physical therapy.  He has been to chiropractor he admits that at Brian beginning it was effective however he later made Brian pain worse.  He has had images with a chiropractor.  His second area of pain is in his left hip.  He denies any previous surgery, interventional therapy or physical therapy.  He admits that he has had recent images.  His third area pains in his legs.  He admits that Brian right is greater than Brian left.  He has some numbness tingling and weakness.  He admits that Brian pain goes down Brian backs of his legs into his knees.  His fourth area of pain is in his knees.  He denies any previous surgery, intervention therapy, physical therapy or recent images.  In considering Brian treatment plan options, Brian [name] was reminded that I no longer take patients for medication management only. I asked him to let me know if he had no intention of taking advantage of Brian interventional therapies, so that we could make arrangements to provide this space to someone interested. I also made it clear that undergoing interventional therapies for Brian purpose of getting pain medications is very inappropriate on Brian part of a patient, and it will not be tolerated in this practice. This type of behavior would suggest true addiction and therefore it requires referral to an addiction specialist.   Further details on both, my assessment(s), as well as Brian proposed treatment plan, please see below.  Controlled Substance Pharmacotherapy Assessment REMS (Risk Evaluation and Mitigation Strategy)  Analgesic: None Highest recorded MME/day: 45 mg/day MME/day: 0 mg/day Pill Count: None expected due to no prior prescriptions written by our practice. No notes on file Pharmacokinetics: Liberation and absorption (onset of action): WNL Distribution  (time to peak effect): WNL Metabolism and excretion (duration of action): WNL         Pharmacodynamics: Desired effects: Analgesia: Mr. Fout reports >50% benefit. Functional ability: Patient reports that medication allows him to accomplish basic ADLs Clinically meaningful improvement in function (CMIF): Sustained CMIF goals met Perceived effectiveness: Described as relatively effective, allowing for increase in activities of daily living (ADL) Undesirable effects: Side-effects or Adverse reactions: None reported Monitoring: [place] PMP: Online review of Brian past 25-monthperiod previously conducted. Not applicable at this point since we have not taken over Brian patient's medication management yet. List of other Serum/Urine Drug Screening Test(s):  Lab Results  Component Value Date   AMPHSCRSER Negative 10/06/2017   BARBSCRSER Negative 10/06/2017   BENZOSCRSER Negative 10/06/2017   COCAINSCRSER Negative 10/06/2017   PCPSCRSER Negative 10/06/2017   THCSCRSER Negative 10/06/2017   OPIATESCRSER Negative 10/06/2017   OXYSCRSER Negative 10/06/2017   PROPOXSCRSER Negative 10/06/2017   List of all UDS test(s) done:  No results found for: TOXASSSELUR, SUMMARY Last UDS on record: No results found for: TOXASSSELUR, SUMMARY UDS interpretation: No unexpected findings.          Medication Assessment Form: Patient introduced to form today Treatment compliance: Treatment may start today if patient agrees with proposed plan. Evaluation of compliance is not applicable at this point Risk Assessment Profile: Aberrant behavior: See initial evaluations. None observed or detected today Comorbid factors increasing risk of overdose: See initial evaluation. No additional risks detected today Opioid risk tool (ORT) (Total Score):  Personal History of Substance Abuse (SUD-Substance use disorder):  Alcohol:    Illegal Drugs:    Rx Drugs:    ORT Risk Level calculation:   Risk of substance use disorder (SUD):  Low  ORT Scoring interpretation table:  Score <3 = Low Risk for SUD  Score between 4-7 = Moderate Risk for SUD  Score >8 = High Risk for Opioid Abuse   Risk Mitigation Strategies:  Patient opioid safety counseling: Completed today. Counseling provided to patient as per "Patient Counseling Document". Document signed by patient, attesting to counseling and understanding Patient-Prescriber Agreement (PPA): Obtained today.  Controlled substance notification to other providers: Written and sent today.  Pharmacologic Plan: Today we may be taking over Brian patient's pharmacological regimen. See below.             Laboratory Chemistry  Inflammation Markers (CRP: Acute Phase) (ESR: Chronic Phase) Lab Results  Component Value Date   CRP 10 10/06/2017   ESRSEDRATE 87 (H) 10/06/2017   LATICACIDVEN 1.5 01/23/2017                         Renal Function Markers Lab Results  Component Value Date   BUN 57 (H) 10/06/2017   CREATININE 4.89 (H) 10/06/2017   BCR 12 10/06/2017   GFRAA 14 (L) 10/06/2017   GFRNONAA 12 (L) 10/06/2017                             Hepatic Function Markers Lab Results  Component Value Date   AST 15 10/06/2017   ALT 30 01/23/2017   ALBUMIN 3.9 10/06/2017   ALKPHOS 90 10/06/2017   LIPASE 22 01/23/2017                        Electrolytes Lab Results  Component Value Date   NA 141 10/06/2017   K 4.9 10/06/2017   CL 103 10/06/2017   CALCIUM 8.9 10/06/2017   MG 2.2 10/06/2017   PHOS 3.6 01/31/2017                        Neuropathy Markers Lab Results  Component Value Date   VITAMINB12 408 10/06/2017   HGBA1C 6.8 (H) 01/02/2017   HIV Non Reactive 12/30/2016                        CNS Tests Lab Results  Component Value Date   SDES CATH TIP 01/24/2017   CULT  01/24/2017    >100,000 CFU STAPHYLOCOCCUS AUREUS SUSCEPTIBILITIES TO FOLLOW Performed at [facility], [place] [address] [place], [place] [address]                         Bone Pathology  Markers Lab Results  Component Value Date   25OHVITD1 28 (L) 10/06/2017   25OHVITD2 1.2 10/06/2017   25OHVITD3 27 10/06/2017                         Coagulation Parameters Lab Results  Component Value Date   INR 1.01 04/14/2017   LABPROT 13.2 04/14/2017   APTT 34 04/14/2017   PLT 264 04/14/2017                        Cardiovascular Markers Lab Results  Component Value Date   BNP 1,783.0 (H) 02/08/2017   TROPONINI 0.04 (HH) 02/08/2017   HGB 13.3 04/21/2017   HCT 39.0 04/21/2017                         Note: Lab results reviewed.  Recent Diagnostic Imaging Review  Lumbosacral Imaging: Lumbar DG Bending views:  Results for orders placed during Brian hospital encounter of 10/28/17  DG Lumbar Spine Complete W/Bend   Narrative CLINICAL DATA:  Chronic back pain for over 10 years, diabetic, dialysis patient.  EXAM: LUMBAR SPINE - COMPLETE WITH BENDING VIEWS  COMPARISON:  CT abdomen dated 01/05/2015.  FINDINGS: Stable dextroscoliosis of Brian lumbar spine. No acute or suspicious osseous finding. No change in alignment on flexion or extension maneuvers.  Degenerative spondylitic changes throughout Brian lumbar spine, mild to moderate in degree with associated disc space narrowings and mild osseous spurring. Degenerative facet hypertrophy appreciated at Brian mid and lower lumbar levels. Chronic appearing wedging of Brian vertebral bodies in Brian lower thoracic spine. Aortic atherosclerosis.  IMPRESSION: 1. Stable chronic scoliosis of Brian lumbar spine. No evidence of instability (no change in alignment with flexion or extension maneuvers). 2. Degenerative spondylitic changes throughout Brian lumbar spine, mild to moderate in degree. 3. No acute findings. 4.  Aortic Atherosclerosis (ICD10-I70.0).   Electronically Signed   By: Franki Cabot M.D.   On: 10/28/2017 16:59    Hip Imaging: Hip-R DG 2-3 views:  Results for orders placed during Brian hospital encounter of 12/29/16   DG Hip Unilat  With Pelvis 2-3 Views Right   Narrative CLINICAL DATA:  Initial evaluation for multiple recent falls, acute right hip pain.  EXAM: DG HIP (WITH OR WITHOUT PELVIS) 2-3V RIGHT  COMPARISON:  None.  FINDINGS: No acute fracture or dislocation. Femoral head in normal alignment with Brian acetabulum. Femoral head height preserved. Bony pelvis intact. SI joints approximated. Mild degenerative osteoarthrosis about Brian hips bilaterally. Degenerative changes noted within lower lumbar spine.  No acute soft tissue abnormality.  Atherosclerosis noted.  IMPRESSION: 1. No acute osseous abnormality about Brian right hip. 2. Mild degenerative osteoarthrosis.   Electronically Signed   By: Jeannine Boga M.D.   On: 12/29/2016 17:10    Hip-L DG 2-3 views:  Results for orders placed during Brian hospital encounter of 12/29/16  DG Hip Unilat W or Wo Pelvis 2-3 Views Left   Narrative CLINICAL DATA:  Initial evaluation for multiple recent falls, pain.  EXAM: DG HIP (WITH OR WITHOUT PELVIS) 2-3V LEFT  COMPARISON:  Prior radiograph from 07/12/2016.  FINDINGS: Left hip in slight internal rotation. No acute fracture dislocation. Femoral head in normal alignment with Brian acetabulum. Heterotopic calcification adjacent to Brian greater trochanter is stable. Bony pelvis intact. SI joints approximated. Mild degenerative osteoarthrosis. Degenerative changes noted within lower lumbar spine.  No acute soft tissue abnormality.  Atherosclerosis.  IMPRESSION: 1. No acute osseous abnormality about Brian left hip. 2. Mild degenerative osteoarthrosis.   Electronically Signed   By: Jeannine Boga M.D.   On: 12/29/2016 17:12    Knee Imaging: Knee-R DG 1-2 views:  Results for orders placed during Brian hospital encounter of 10/28/17  DG Knee 1-2 Views Right   Narrative CLINICAL DATA:  Chronic knee pain for over 10 years, diabetic, dialysis patient.  EXAM: RIGHT KNEE - 1-2  VIEW  COMPARISON:  None.  FINDINGS: Very mild degenerative change, with slight joint space narrowing and minimal osseous spurring. No acute or  suspicious osseous finding. No appreciable joint effusion. Vascular calcifications noted within Brian surrounding soft tissues.  IMPRESSION: 1. No acute findings. 2. Minimal degenerative change. 3. Vascular calcifications within Brian surrounding soft tissues.   Electronically Signed   By: Franki Cabot M.D.   On: 10/28/2017 16:56    Knee-L DG 1-2 views:  Results for orders placed during Brian hospital encounter of 10/28/17  DG Knee 1-2 Views Left   Narrative CLINICAL DATA:  Chronic back pain and knee pain for over 10 years, diabetic, dialysis patient.  EXAM: LEFT KNEE - 1-2 VIEW  COMPARISON:  None.  FINDINGS: AP and lateral views of Brian LEFT knee are provided. There is mild tricompartmental degenerative joint disease, with mild joint space narrowings and mild osseous spurring. No large osteophytes or other signs of advanced degenerative joint disease.  No acute or suspicious osseous finding. 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 No convincing joint effusion. Extensive vascular calcifications within Brian surrounding soft tissues.  IMPRESSION: 1. No acute findings. 2. 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 3. Mild tricompartmental degenerative change. 4. Extensive vascular calcifications within Brian surrounding soft tissues.   Electronically Signed   By: Franki Cabot M.D.   On: 10/28/2017 16:55    Complexity Note: Imaging results reviewed. Results shared with Mr. Brearley, using Layman's terms.                         Meds   Current Outpatient Medications:  .  albuterol (PROVENTIL HFA;VENTOLIN HFA) 108 (90 Base) MCG/ACT inhaler, Inhale 2 puffs into Brian lungs every 4 (four) hours as needed for wheezing or shortness of breath. , Disp: , Rfl:  .  aspirin EC 81 MG EC tablet, Take 1 tablet (81 mg total) by  mouth daily., Disp: 30 tablet, Rfl: 0 .  atorvastatin (LIPITOR) 80 MG tablet, Take 80 mg daily by mouth., Disp: , Rfl:  .  carvedilol (COREG) 3.125 MG tablet, Take 1 tablet (3.125 mg total) by mouth 2 (two) times daily with a meal., Disp: 60 tablet, Rfl: 0 .  fluticasone (FLOVENT HFA) 220 MCG/ACT inhaler, Inhale 2 puffs into Brian lungs 2 (two) times daily., Disp: , Rfl:  .  furosemide (LASIX) 40 MG tablet, Take 40 mg by mouth See admin instructions. Take 40 mg by mouth daily on non dialysis days Tuesday, Thursday, Saturday and Sunday, Disp: , Rfl:  .  gabapentin (NEURONTIN) 300 MG capsule, Take 600 mg by mouth 3 (three) times daily., Disp: , Rfl:  .  ibuprofen (ADVIL,MOTRIN) 200 MG tablet, Take 400 mg by mouth daily as needed for headache or moderate pain. , Disp: , Rfl:  .  levothyroxine (SYNTHROID, LEVOTHROID) 50 MCG tablet, Take 50 mcg daily before breakfast by mouth., Disp: , Rfl:  .  lidocaine-prilocaine (EMLA) cream, Apply 1 application topically as needed (for port access). , Disp: , Rfl: 11 .  sertraline (ZOLOFT) 100 MG tablet, Take 100 mg by mouth daily., Disp: , Rfl:  .  sevelamer carbonate (RENVELA) 800 MG tablet, Take 800 mg by mouth 3 (three) times daily with meals., Disp: , Rfl:  .  vitamin C (ASCORBIC ACID) 500 MG tablet, Take 500 mg by mouth daily., Disp: , Rfl:  .  Vitamin D, Ergocalciferol, (DRISDOL) 50000 units CAPS capsule, Take 50,000 Units by mouth every Friday. , Disp: , Rfl:   ROS  Constitutional: Denies any fever or chills Gastrointestinal: No reported hemesis, hematochezia, vomiting, or acute GI distress  Musculoskeletal: Denies any acute onset joint swelling, redness, loss of ROM, or weakness Neurological: No reported episodes of acute onset apraxia, aphasia, dysarthria, agnosia, amnesia, paralysis, loss of coordination, or loss of consciousness  Allergies  Mr. Blue is allergic to naproxen.  PFSH  Drug: Mr. Tedesco  reports that he does not use drugs. Alcohol:   reports that he does not drink alcohol. Tobacco:  reports that he has been smoking cigarettes. He has been smoking about 0.25 packs per day. He has never used smokeless tobacco. Medical:  has a past medical history of CHF (congestive heart failure) ([place]), COPD (chronic obstructive pulmonary disease) ([place]), Diabetes mellitus type 2 in obese ([place]), DM (diabetes mellitus) type II controlled with renal manifestation ([place]), Hypercholesteremia, Hypertension, Morbid obesity with BMI of 45.0-49.9, adult ([place]), Myocardial infarction ([place]), Neuropathy, Pancreatitis, acute, Pneumonia, Sleep apnea, and Spinal stenosis. Surgical: Mr. Tosh  has a past surgical history that includes Amputation; Cholecystectomy (1998); laparoscopic appendectomy (N/A, 01/06/2015); DIALYSIS/PERMA CATHETER INSERTION (N/A, 01/03/2017); DIALYSIS/PERMA CATHETER REMOVAL (N/A, 01/24/2017); TEE without cardioversion (N/A, 01/28/2017); DIALYSIS/PERMA CATHETER INSERTION (N/A, 01/31/2017); Hernia repair; AV fistula placement (Left, 04/21/2017); and DIALYSIS/PERMA CATHETER REMOVAL (N/A, 08/02/2017). Family: family history includes Heart disease in his father and maternal grandfather; Hyperlipidemia in his mother; Hypertension in his mother.  Constitutional Exam  General appearance: Well nourished, well developed, and well hydrated. In no apparent acute distress There were no vitals filed for this visit. BMI Assessment: Estimated body mass index is 41.76 kg/m as calculated from Brian following:   Height as of 04/21/17: '5\' 10"'  (1.778 m).   Weight as of 10/06/17: 291 lb 0.1 oz (132 kg).  BMI interpretation table: BMI level Category Range association with higher incidence of chronic pain  <18 kg/m2 Underweight   18.5-24.9 kg/m2 Ideal body weight   25-29.9 kg/m2 Overweight Increased incidence by 20%  30-34.9 kg/m2 Obese (Class I) Increased incidence by 68%  35-39.9 kg/m2 Severe obesity (Class II) Increased incidence by 136%  >40 kg/m2 Extreme obesity  (Class III) Increased incidence by 254%   Patient's current BMI Ideal Body weight  There is no height or weight on file to calculate BMI. Patient weight not recorded   BMI Readings from Last 4 Encounters:  10/06/17 41.76 kg/m  04/21/17 42.33 kg/m  04/14/17 42.33 kg/m  04/05/17 42.18 kg/m   Wt Readings from Last 4 Encounters:  10/06/17 291 lb 0.1 oz (132 kg)  04/21/17 295 lb (133.8 kg)  04/14/17 295 lb (133.8 kg)  04/05/17 294 lb (133.4 kg)  Psych/Mental status: Alert, oriented x 3 (person, place, & time)       Eyes: PERLA Respiratory: No evidence of acute respiratory distress  Cervical Spine Area Exam  Skin & Axial Inspection: No masses, redness, edema, swelling, or associated skin lesions Alignment: Symmetrical Functional ROM: Unrestricted ROM      Stability: No instability detected Muscle Tone/Strength: Functionally intact. No obvious neuro-muscular anomalies detected. Sensory (Neurological): Unimpaired Palpation: No palpable anomalies              Upper Extremity (UE) Exam    Side: Right upper extremity  Side: Left upper extremity  Skin & Extremity Inspection: Skin color, temperature, and hair growth are WNL. No peripheral edema or cyanosis. No masses, redness, swelling, asymmetry, or associated skin lesions. No contractures.  Skin & Extremity Inspection: Skin color, temperature, and hair growth are WNL. No peripheral edema or cyanosis. No masses, redness, swelling, asymmetry, or associated skin lesions. No contractures.  Functional ROM: Unrestricted ROM  Functional ROM: Unrestricted ROM          Muscle Tone/Strength: Functionally intact. No obvious neuro-muscular anomalies detected.  Muscle Tone/Strength: Functionally intact. No obvious neuro-muscular anomalies detected.  Sensory (Neurological): Unimpaired          Sensory (Neurological): Unimpaired          Palpation: No palpable anomalies              Palpation: No palpable anomalies              Provocative  Test(s):  Phalen's test: deferred Tinel's test: deferred Apley's scratch test (touch opposite shoulder):  Action 1 (Across chest): deferred Action 2 (Overhead): deferred Action 3 (LB reach): deferred   Provocative Test(s):  Phalen's test: deferred Tinel's test: deferred Apley's scratch test (touch opposite shoulder):  Action 1 (Across chest): deferred Action 2 (Overhead): deferred Action 3 (LB reach): deferred    Thoracic Spine Area Exam  Skin & Axial Inspection: No masses, redness, or swelling Alignment: Symmetrical Functional ROM: Unrestricted ROM Stability: No instability detected Muscle Tone/Strength: Functionally intact. No obvious neuro-muscular anomalies detected. Sensory (Neurological): Unimpaired Muscle strength & Tone: No palpable anomalies  Lumbar Spine Area Exam  Skin & Axial Inspection: No masses, redness, or swelling Alignment: Symmetrical Functional ROM: Unrestricted ROM       Stability: No instability detected Muscle Tone/Strength: Functionally intact. No obvious neuro-muscular anomalies detected. Sensory (Neurological): Unimpaired Palpation: No palpable anomalies       Provocative Tests: Hyperextension/rotation test: deferred today       Lumbar quadrant test (Kemp's test): deferred today       Lateral bending test: deferred today       Patrick's Maneuver: deferred today                   FABER test: deferred today                   S-I anterior distraction/compression test: deferred today         S-I lateral compression test: deferred today         S-I Thigh-thrust test: deferred today         S-I Gaenslen's test: deferred today          Gait & Posture Assessment  Ambulation: Unassisted Gait: Relatively normal for age and body habitus Posture: WNL   Lower Extremity Exam    Side: Right lower extremity  Side: Left lower extremity  Stability: No instability observed          Stability: No instability observed          Skin & Extremity Inspection: Skin  color, temperature, and hair growth are WNL. No peripheral edema or cyanosis. No masses, redness, swelling, asymmetry, or associated skin lesions. No contractures.  Skin & Extremity Inspection: Skin color, temperature, and hair growth are WNL. No peripheral edema or cyanosis. No masses, redness, swelling, asymmetry, or associated skin lesions. No contractures.  Functional ROM: Unrestricted ROM                  Functional ROM: Unrestricted ROM                  Muscle Tone/Strength: Functionally intact. No obvious neuro-muscular anomalies detected.  Muscle Tone/Strength: Functionally intact. No obvious neuro-muscular anomalies detected.  Sensory (Neurological): Unimpaired  Sensory (Neurological): Unimpaired  Palpation: No palpable anomalies  Palpation: No palpable anomalies   Assessment & Plan  Primary Diagnosis & Pertinent Problem List: Brian  primary encounter diagnosis was Chronic pain syndrome. 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, Lumbar foraminal stenosis (Multilevel) (Right: L3-4), Abnormal MRI, lumbar spine (06/26/2014), Other intervertebral disc degeneration, lumbar region, Chronic lower extremity pain (Tertiary Area of Pain) (Bilateral) (R>L), Lower extremity lymphedema (Bilateral), Neuropathy (HCC), Chronic knee pain (Fourth Area of Pain) (Bilateral) (L>R), Osteoarthritis of knees (Bilateral), Tricompartment osteoarthritis of knee (Left), Osteoarthritis of  AC (acromioclavicular) joint (Left), Neurogenic pain, Chronic musculoskeletal pain, Disorder  of skeletal system, Problems influencing health status, ESRD on dialysis ([place]), Class 3 obesity with alveolar hypoventilation without serious comorbidity with body mass index (BMI) of 40.0 to 44.9 in adult [facility]), Chronic obstructive pulmonary disease, unspecified COPD type ([place]), Chronic diastolic heart failure ([place]), Obstructive sleep apnea, Tobacco use, Pharmacologic therapy, and Vitamin D insufficiency were also pertinent to this visit.  Visit Diagnosis: 1. Chronic pain syndrome   2. Chronic low back pain (Primary Area of Pain) (Bilateral) (R>L)   3. 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 DDD (degenerative disc disease), lumbosacral   5. Lumbar facet hypertrophy   6. Osteoarthritis of facet joint of lumbar spine   7. Lumbar facet syndrome (Bilateral)   8. Spondylosis without myelopathy or radiculopathy, lumbosacral region   9. Lumbar Dextroscoliosis   10. Chronic hip pain (Secondary Area of Pain) (Left)   11. 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 Lumbar foraminal stenosis (Multilevel) (Right: L3-4)   13. Abnormal MRI, lumbar spine (06/26/2014)   14. Other intervertebral disc degeneration, lumbar region   15. Chronic lower extremity pain (Tertiary Area of Pain) (Bilateral) (R>L)   16. Lower extremity lymphedema (Bilateral)   17. Neuropathy ([place])   18. Chronic knee pain (Fourth Area of Pain) (Bilateral) (L>R)   19. Osteoarthritis of knees (Bilateral)   20. Tricompartment osteoarthritis of knee (Left)   21. Osteoarthritis of  AC (acromioclavicular) joint (Left)   22. Neurogenic pain   23. Chronic musculoskeletal pain   24. Disorder of skeletal system   25. Problems influencing health status   26. ESRD on dialysis ([place])   27. Class 3 obesity with alveolar hypoventilation without serious comorbidity with body mass index (BMI) of 40.0 to 44.9 in adult ([place])   28. Chronic obstructive pulmonary disease, unspecified COPD type ([place])    29. Chronic diastolic heart failure ([place])   30. Obstructive sleep apnea   31. Tobacco use   32. Pharmacologic therapy   33. Vitamin D insufficiency    Problems updated and reviewed during this visit: Problem  Lumbar Dextroscoliosis  Lumbar facet hypertrophy  Osteoarthritis of Facet Joint of Lumbar Spine  Lumbar facet syndrome (Bilateral)  Spondylosis Without Myelopathy Or Radiculopathy, Lumbosacral Region  Ddd (Degenerative Disc Disease), Lumbosacral  Osteoarthritis of knees (Bilateral)  Tricompartment osteoarthritis of knee (Left)  Chronic low back pain (Primary Area of Pain) (Bilateral) (R>L)   Pain is bilateral with Brian right side being worse on Brian left.  He admits to chiropractic manipulations which have made pain worse.  Denies surgery.  Describes Brian pain as debilitating.  Lower extremity pain is described as being intermittent and only occasional.  This is thought  to be referred and not sciatic in distribution.   Osteoarthritis of  AC (acromioclavicular) joint (Left)  Chronic Pain Syndrome  Abnormal MRI, lumbar spine (06/26/2014)   IMPRESSION: Severe central canal narrowing at T11-12 with high T2 signal within Brian cord and focal atrophy. Moderate to severe central canal narrowing at L2-3, L3-4, and L4-5. Multilevel degenerative disc disease with neuroforaminal narrowing most prominent at L3-4 with moderate to severe right neural foraminal narrowing.   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   Lumbar foraminal stenosis (Multilevel) (Right: L3-4)   Multilevel degenerative disc disease with neuroforaminal narrowing most prominent at L3-4 with moderate to severe right neural foraminal narrowing.   Other Intervertebral Disc  Degeneration, Lumbar Region  Neurogenic Pain  Chronic Musculoskeletal Pain  Chronic low back pain (Bilateral) (L>R) w/ sciatica (Bilateral)  Chronic hip pain (Secondary Area of Pain) (Left)  Chronic lower extremity pain (Tertiary Area of Pain) (Bilateral) (R>L)   Pain is thought to be referred.  Described to go down Brian backs of his legs into his knees.  Pain is bilateral but it seems to be worse on Brian right side when compared to Brian left.   Chronic knee pain (Fourth Area of Pain) (Bilateral) (L>R)  Lower extremity lymphedema (Bilateral)  Neuropathy (HCC)  Class 3 Obesity With Alveolar Hypoventilation Without Serious Comorbidity With Body Mass Index (Bmi) of 40.0 to 44.9 in Adult (Hcc)  Vitamin D Insufficiency  Esrd On Dialysis (Hcc)  Chronic Diastolic Heart Failure (Hcc)  Severe Obesity (Bmi >= 40) (Hcc)    Plan of Care  Pharmacotherapy (Medications Ordered): No orders of Brian defined types were placed in this encounter.  Procedure Orders    No procedure(s) ordered today   Lab Orders  No laboratory test(s) ordered today   Imaging Orders  No imaging studies ordered today   Referral Orders  No referral(s) requested today    Pharmacological management options:  Opioid Analgesics: We'll take over management today. See above orders Membrane stabilizer: We have discussed Brian possibility of optimizing this mode of therapy, if tolerated Muscle relaxant: We have discussed Brian possibility of a trial NSAID: We have discussed Brian possibility of a trial Other analgesic(s): To be determined at a later time   Interventional management options: Planned, scheduled, and/or pending:    X-rays of Brian thoracic spine to evaluate wedge deformities. MRI of Brian lumbar spine.  Last done on 2016 demonstrating multilevel lumbar spinal and foraminal stenosis. Nerve conduction test of Brian lower extremities to evaluate possible diabetic peripheral neuropathy. X-rays of Brian left hip to evaluate  chronic left hip pain. ***   Considering:   Diagnostic bilateral lumbar facet block  Possible bilateral lumbar facet RFA  Diagnostic T11-12 LESI  Diagnostic bilateral T11-12 transforaminal ESI  Diagnostic L2-3 LESI  Diagnostic bilateral L2-3 transforaminal ESI  Diagnostic L3-4 LESI  Diagnostic right-sided L3-4 transforaminal ESI  Diagnostic bilateral L3-4 transforaminal ESI  Diagnostic L4-5 LESI  Diagnostic bilateral L4-5 transforaminal ESI  Diagnostic left intra-articular hip injection  Diagnostic left femoral nerve + obturator nerve block  Possible left femoral nerve + obturator RFA Diagnostic bilateral knee injections with local anesthetic and steroid  Possible series of 5 bilateral intra-articular Hyalgan knee injections  Diagnostic bilateral genicular nerve block  Possible bilateral genicular nerve RFA    PRN Procedures:   None at this time   Provider-requested follow-up: No follow-ups on file.  Future Appointments  Date Time Provider [place]  10/31/2017  7:45 AM Milinda Pointer, MD [facility] None  01/31/2018  2:00 PM [facility] 1 [facility] None  01/31/2018  3:00 PM Dew, Erskine Squibb, MD [facility] None    Primary Care Physician: Martin Majestic, FNP Location: [facility] Note by: Brian Cola, MD Date: 10/31/2017; Time: 6:01 AM

## 2017-10-31 ENCOUNTER — Ambulatory Visit: Payer: Medicare Other | Admitting: Pain Medicine

## 2017-10-31 DIAGNOSIS — M47816 Spondylosis without myelopathy or radiculopathy, lumbar region: Secondary | ICD-10-CM | POA: Insufficient documentation

## 2017-10-31 DIAGNOSIS — M170 Bilateral primary osteoarthritis of knee: Secondary | ICD-10-CM | POA: Insufficient documentation

## 2017-10-31 DIAGNOSIS — M792 Neuralgia and neuritis, unspecified: Secondary | ICD-10-CM | POA: Insufficient documentation

## 2017-10-31 DIAGNOSIS — M48061 Spinal stenosis, lumbar region without neurogenic claudication: Secondary | ICD-10-CM | POA: Insufficient documentation

## 2017-10-31 DIAGNOSIS — Z6841 Body Mass Index (BMI) 40.0 and over, adult: Secondary | ICD-10-CM

## 2017-10-31 DIAGNOSIS — G894 Chronic pain syndrome: Secondary | ICD-10-CM | POA: Insufficient documentation

## 2017-10-31 DIAGNOSIS — M545 Low back pain, unspecified: Secondary | ICD-10-CM | POA: Insufficient documentation

## 2017-10-31 DIAGNOSIS — G8929 Other chronic pain: Secondary | ICD-10-CM | POA: Insufficient documentation

## 2017-10-31 DIAGNOSIS — R937 Abnormal findings on diagnostic imaging of other parts of musculoskeletal system: Secondary | ICD-10-CM | POA: Insufficient documentation

## 2017-10-31 DIAGNOSIS — M5137 Other intervertebral disc degeneration, lumbosacral region: Secondary | ICD-10-CM | POA: Insufficient documentation

## 2017-10-31 DIAGNOSIS — M51379 Other intervertebral disc degeneration, lumbosacral region without mention of lumbar back pain or lower extremity pain: Secondary | ICD-10-CM | POA: Insufficient documentation

## 2017-10-31 DIAGNOSIS — M7918 Myalgia, other site: Secondary | ICD-10-CM

## 2017-10-31 DIAGNOSIS — M4804 Spinal stenosis, thoracic region: Secondary | ICD-10-CM | POA: Insufficient documentation

## 2017-10-31 DIAGNOSIS — M5136 Other intervertebral disc degeneration, lumbar region: Secondary | ICD-10-CM | POA: Insufficient documentation

## 2017-10-31 DIAGNOSIS — M19012 Primary osteoarthritis, left shoulder: Secondary | ICD-10-CM | POA: Insufficient documentation

## 2017-10-31 DIAGNOSIS — M1712 Unilateral primary osteoarthritis, left knee: Secondary | ICD-10-CM | POA: Insufficient documentation

## 2017-10-31 DIAGNOSIS — M47817 Spondylosis without myelopathy or radiculopathy, lumbosacral region: Secondary | ICD-10-CM | POA: Insufficient documentation

## 2017-10-31 DIAGNOSIS — E662 Morbid (severe) obesity with alveolar hypoventilation: Secondary | ICD-10-CM | POA: Insufficient documentation

## 2017-10-31 DIAGNOSIS — M4180 Other forms of scoliosis, site unspecified: Secondary | ICD-10-CM | POA: Insufficient documentation

## 2017-10-31 DIAGNOSIS — E559 Vitamin D deficiency, unspecified: Secondary | ICD-10-CM | POA: Insufficient documentation

## 2017-10-31 NOTE — Progress Notes (Signed)
Results were reviewed and found to be: mildly abnormal  No acute injury or pathology identified  Review would suggest interventional pain management techniques may be of benefit 

## 2017-10-31 NOTE — Progress Notes (Signed)
Results were reviewed and found to be: abnormal  No acute injury or pathology identified  Review would suggest interventional pain management techniques may be of benefit

## 2017-11-14 NOTE — Progress Notes (Addendum)
Patient's Name: Brian [name]  MRN: [id number]  Referring Provider: Martin Majestic, *  DOB: 1960-11-15  PCP: Martin Majestic, FNP  DOS: 11/16/2017  Note by: Gaspar Cola, MD  Service setting: Ambulatory outpatient  Specialty: Interventional Pain Management  Location: [facility] (AMB) Pain Management Facility    Patient type: Established   Primary Reason(s) for Visit: Encounter for evaluation before starting new chronic pain management plan of care (Level of risk: moderate) CC: Back Pain (lower)  HPI  Brian [name] is a 57 y.o. year old, male patient, who comes today for a follow-up evaluation to review the test results and decide on a treatment plan. He has Neuropathy ([place]); Depression; HLD (hyperlipidemia); HTN (hypertension); Severe obesity (BMI >= 40) ([place]); Amputated toe ([place]); Diabetes mellitus ([place]); DM (diabetes mellitus) type II controlled with renal manifestation ([place]); COPD (chronic obstructive pulmonary disease) ([place]); Chronic diastolic heart failure ([place]); Tobacco use; Hyperkalemia; Obstructive sleep apnea on CPAP; Lower extremity lymphedema (Bilateral); Acute on chronic diastolic CHF (congestive heart failure) ([place]); NSTEMI (non-ST elevated myocardial infarction) ([place]); ESRD on dialysis ([place]); Fluid overload; Pharmacologic therapy; Disorder of skeletal system; Problems influencing health status; Chronic low back pain (Bilateral) (L>R) w/ sciatica (Bilateral); Chronic hip pain [facility] Area of Pain) (Left); Chronic lower extremity pain (Secondary Area of Pain) (Bilateral) (R>L); Chronic knee pain (Fourth Area of Pain) (Bilateral) (L>R); Lumbar Dextroscoliosis; Lumbar facet hypertrophy; Osteoarthritis of facet joint of lumbar spine; Lumbar facet syndrome (Bilateral); Spondylosis without myelopathy or radiculopathy, lumbosacral region; DDD (degenerative disc disease), lumbosacral; Osteoarthritis of knees (Bilateral); Tricompartment osteoarthritis of knee (Left); Chronic low back pain  (Primary Area of Pain) (Bilateral) (R>L); Osteoarthritis of  AC (acromioclavicular) joint (Left); Class 3 obesity with alveolar hypoventilation without serious comorbidity with body mass index (BMI) of 40.0 to 44.9 in adult [facility]); Chronic pain syndrome; Abnormal MRI, lumbar spine (06/26/2014); 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 His primarily concern today is the Back Pain (lower)  Pain Assessment: Location: Lower Back Radiating: Pain radiaties down to back of thigh Onset: More than a month ago Duration: Chronic pain Quality: Aching, Burning, Nagging, Tightness, Sharp(sometimes back will lock up) Severity: 8 /10 (subjective, self-reported pain score)  Note: Reported level is inconsistent with clinical observations. Clinically the patient looks like a 3/10 A 3/10 is viewed as "Moderate" and described as significantly interfering with activities of daily living (ADL). It becomes difficult to feed, bathe, get dressed, get on and off the toilet or to perform personal hygiene functions. Difficult to get in and out of bed or a chair without assistance. Very distracting. With effort, it can be ignored when deeply involved in activities. Information on the proper use of the pain scale provided to the patient today. When using our objective Pain Scale, levels between 6 and 10/10 are said to belong in an emergency room, as it progressively worsens from a 6/10, described as severely limiting, requiring emergency care not usually available at an outpatient pain management  facility. At a 6/10 level, communication becomes difficult and requires great effort. Assistance to reach the emergency department may be required. Facial flushing and profuse sweating along with potentially dangerous increases in  heart rate and blood pressure will be evident. Effect on ADL: not able to work due to the pain, on dialysis since nov 2018 Timing: Constant Modifying factors: rest, warm shower and ice pack BP: 109/66  HR: 77  Brian [name] comes in today for a follow-up visit after his initial evaluation on 10/06/2017. Today we went over the results of his tests. These were explained in "Layman's terms". During today's appointment we went over my diagnostic impression, as well as the proposed treatment plan.  According to the patient his primary area of pain in his lower back (B) (R>L).  He denies any precipitating factors.  He admits that he has been ongoing however getting worse.  He admits that the right is greater than the left.  He admits that sometimes he has leg pain.  He denies any previous surgery, interventional therapy or physical therapy.  He has been to chiropractor he admits that at the beginning it was effective however he later made the pain worse.  He has had images with a chiropractor.  His second area of pain is in his hip (B) (L>R).  He denies any previous surgery, interventional therapy or physical therapy.  He admits that he has had recent images.  His third area pains in his legs (B) (L>R).  He admits that the Left is greater than the Right.  He has some numbness tingling and weakness.  He admits that the pain goes down the backs of his legs into his knees. LLEP - runs down back of leg to knee. RLEP - down the back to knee.  His fourth area of pain is in his knees (B) (L>R).  He denies any previous surgery, intervention therapy, physical therapy or recent images.  In considering the treatment plan options, Brian [name] was reminded that I no longer take patients for  medication management only. I asked him to let me know if he had no intention of taking advantage of the interventional therapies, so that we could make arrangements to provide this space to someone interested. I also made it clear that undergoing interventional therapies for the purpose of getting pain medications is very inappropriate on the part of a patient, and it will not be tolerated in this practice. This type of behavior would suggest true addiction and therefore it requires referral to an addiction specialist.   Further details on both, my assessment(s), as well as the proposed treatment plan, please see below.  Controlled Substance Pharmacotherapy Assessment REMS (Risk Evaluation and Mitigation Strategy)  Analgesic: None (hydrocodone/APAP 5/325 last prescribed on 04/21/2017 number 28 pills to be taken 1 every 6 hours) (prescribed by Algernon Huxley, MD - Dialysis - [facility]. Given to him for placement of Fistula - Left Arm) Dialysis 3/wk. Highest recorded MME/day: 45 mg/day MME/day: 0 mg/day Pill Count: None expected due to no prior prescriptions written by our practice. No notes on file Pharmacokinetics: Liberation and absorption (onset of action): WNL Distribution (time to peak effect): WNL Metabolism and excretion (duration of action): WNL         Pharmacodynamics: Desired effects: Analgesia: Brian [name] reports >50% benefit. Functional ability: Patient reports that medication allows him to accomplish basic ADLs Clinically meaningful improvement in function (CMIF): Sustained CMIF goals met Perceived effectiveness: Described as relatively effective, allowing for increase in activities of daily living (ADL) Undesirable effects: Side-effects or Adverse reactions: None reported Monitoring: [place] PMP: Online review of the past 75-monthperiod previously conducted. Not applicable at this  point since we have not taken over the patient's medication management yet. List of  other Serum/Urine Drug Screening Test(s):  Lab Results  Component Value Date   AMPHSCRSER Negative 10/06/2017   BARBSCRSER Negative 10/06/2017   BENZOSCRSER Negative 10/06/2017   COCAINSCRSER Negative 10/06/2017   PCPSCRSER Negative 10/06/2017   THCSCRSER Negative 10/06/2017   OPIATESCRSER Negative 10/06/2017   OXYSCRSER Negative 10/06/2017   PROPOXSCRSER Negative 10/06/2017   List of all UDS test(s) done:  No results found.  Patient is anuric, due to ESRD. Last UDS on record: No results found. Patient is anuric, due to ESRD. UDS interpretation: No unexpected findings.          Medication Assessment Form: Patient introduced to form today Treatment compliance: Treatment may start today if patient agrees with proposed plan. Evaluation of compliance is not applicable at this point Risk Assessment Profile: Aberrant behavior: See initial evaluations. None observed or detected today Comorbid factors increasing risk of overdose: See initial evaluation. No additional risks detected today Opioid risk tool (ORT) (Total Score): 0 Personal History of Substance Abuse (SUD-Substance use disorder):  Alcohol: Alcohol: Negative  Illegal Drugs: Illegal Drugs: Negative  Rx Drugs: Rx Drugs: Negative  ORT Risk Level calculation: Opioid Risk Interpretation: Low Risk Risk of substance use disorder (SUD): Low Opioid Risk Tool - 11/16/17 0904      Family History of Substance Abuse   Alcohol  Negative    Illegal Drugs  Negative    Rx Drugs  Negative      Personal History of Substance Abuse   Alcohol  Negative    Illegal Drugs  Negative    Rx Drugs  Negative      Age   Age between 33-45 years   No      History of Preadolescent Sexual Abuse   History of Preadolescent Sexual Abuse  Negative or Male      Psychological Disease   Psychological Disease  Negative    Depression  Negative      Total Score   Opioid Risk Tool Scoring  0    Opioid Risk Interpretation  Low Risk      ORT Scoring  interpretation table:  Score <3 = Low Risk for SUD  Score between 4-7 = Moderate Risk for SUD  Score >8 = High Risk for Opioid Abuse   Risk Mitigation Strategies:  Patient opioid safety counseling: Completed today. Counseling provided to patient as per "Patient Counseling Document". Document signed by patient, attesting to counseling and understanding Patient-Prescriber Agreement (PPA): Obtained today.  Controlled substance notification to other providers: Written and sent today.  Pharmacologic Plan: Today we may be taking over the patient's pharmacological regimen. See below.             Laboratory Chemistry  Inflammation Markers (CRP: Acute Phase) (ESR: Chronic Phase) Lab Results  Component Value Date   CRP 10 10/06/2017   ESRSEDRATE 87 (H) 10/06/2017   LATICACIDVEN 1.5 01/23/2017                         Renal Function Markers Lab Results  Component Value Date   BUN 57 (H) 10/06/2017   CREATININE 4.89 (H) 10/06/2017   BCR 12 10/06/2017   GFRAA 14 (L) 10/06/2017   GFRNONAA 12 (L) 10/06/2017                             Hepatic Function Markers  Lab Results  Component Value Date   AST 15 10/06/2017   ALT 30 01/23/2017   ALBUMIN 3.9 10/06/2017   ALKPHOS 90 10/06/2017   LIPASE 22 01/23/2017                        Electrolytes Lab Results  Component Value Date   NA 141 10/06/2017   K 4.9 10/06/2017   CL 103 10/06/2017   CALCIUM 8.9 10/06/2017   MG 2.2 10/06/2017   PHOS 3.6 01/31/2017                        Neuropathy Markers Lab Results  Component Value Date   VITAMINB12 408 10/06/2017   HGBA1C 6.8 (H) 01/02/2017   HIV Non Reactive 12/30/2016                        Bone Pathology Markers Lab Results  Component Value Date   25OHVITD1 28 (L) 10/06/2017   25OHVITD2 1.2 10/06/2017   25OHVITD3 27 10/06/2017                         Coagulation Parameters Lab Results  Component Value Date   INR 1.01 04/14/2017   LABPROT 13.2 04/14/2017   APTT 34  04/14/2017   PLT 264 04/14/2017                        Cardiovascular Markers Lab Results  Component Value Date   BNP 1,783.0 (H) 02/08/2017   TROPONINI 0.04 (HH) 02/08/2017   HGB 13.3 04/21/2017   HCT 39.0 04/21/2017                         Note: Lab results reviewed.  Recent Diagnostic Imaging Review  Lumbosacral Imaging: Lumbar DG Bending views:  Results for orders placed during the hospital encounter of 10/28/17  DG Lumbar Spine Complete W/Bend   Narrative CLINICAL DATA:  Chronic back pain for over 10 years, diabetic, dialysis patient.  EXAM: LUMBAR SPINE - COMPLETE WITH BENDING VIEWS  COMPARISON:  CT abdomen dated 01/05/2015.  FINDINGS: Stable dextroscoliosis of the lumbar spine. No acute or suspicious osseous finding. No change in alignment on flexion or extension maneuvers.  Degenerative spondylitic changes throughout the lumbar spine, mild to moderate in degree with associated disc space narrowings and mild osseous spurring. Degenerative facet hypertrophy appreciated at the mid and lower lumbar levels. Chronic appearing wedging of the vertebral bodies in the lower thoracic spine. Aortic atherosclerosis.  IMPRESSION: 1. Stable chronic scoliosis of the lumbar spine. No evidence of instability (no change in alignment with flexion or extension maneuvers). 2. Degenerative spondylitic changes throughout the lumbar spine, mild to moderate in degree. 3. No acute findings. 4.  Aortic Atherosclerosis (ICD10-I70.0).   Electronically Signed   By: Franki Cabot M.D.   On: 10/28/2017 16:59    Hip Imaging: Hip-R DG 2-3 views:  Results for orders placed during the hospital encounter of 12/29/16  DG Hip Unilat  With Pelvis 2-3 Views Right   Narrative CLINICAL DATA:  Initial evaluation for multiple recent falls, acute right hip pain.  EXAM: DG HIP (WITH OR WITHOUT PELVIS) 2-3V RIGHT  COMPARISON:  None.  FINDINGS: No acute fracture or dislocation. Femoral head  in normal alignment with the acetabulum. Femoral head height preserved. Bony pelvis intact. SI joints approximated. Mild  degenerative osteoarthrosis about the hips bilaterally. Degenerative changes noted within lower lumbar spine.  No acute soft tissue abnormality.  Atherosclerosis noted.  IMPRESSION: 1. No acute osseous abnormality about the right hip. 2. Mild degenerative osteoarthrosis.   Electronically Signed   By: Jeannine Boga M.D.   On: 12/29/2016 17:10    Hip-L DG 2-3 views:  Results for orders placed during the hospital encounter of 12/29/16  DG Hip Unilat W or Wo Pelvis 2-3 Views Left   Narrative CLINICAL DATA:  Initial evaluation for multiple recent falls, pain.  EXAM: DG HIP (WITH OR WITHOUT PELVIS) 2-3V LEFT  COMPARISON:  Prior radiograph from 07/12/2016.  FINDINGS: Left hip in slight internal rotation. No acute fracture dislocation. Femoral head in normal alignment with the acetabulum. Heterotopic calcification adjacent to the greater trochanter is stable. Bony pelvis intact. SI joints approximated. Mild degenerative osteoarthrosis. Degenerative changes noted within lower lumbar spine.  No acute soft tissue abnormality.  Atherosclerosis.  IMPRESSION: 1. No acute osseous abnormality about the left hip. 2. Mild degenerative osteoarthrosis.   Electronically Signed   By: Jeannine Boga M.D.   On: 12/29/2016 17:12    Knee Imaging: Knee-R DG 1-2 views:  Results for orders placed during the hospital encounter of 10/28/17  DG Knee 1-2 Views Right   Narrative CLINICAL DATA:  Chronic knee pain for over 10 years, diabetic, dialysis patient.  EXAM: RIGHT KNEE - 1-2 VIEW  COMPARISON:  None.  FINDINGS: Very mild degenerative change, with slight joint space narrowing and minimal osseous spurring. No acute or suspicious osseous finding. No appreciable joint effusion. Vascular calcifications noted within the surrounding soft  tissues.  IMPRESSION: 1. No acute findings. 2. Minimal degenerative change. 3. Vascular calcifications within the surrounding soft tissues.   Electronically Signed   By: Franki Cabot M.D.   On: 10/28/2017 16:56    Knee-L DG 1-2 views:  Results for orders placed during the hospital encounter of 10/28/17  DG Knee 1-2 Views Left   Narrative CLINICAL DATA:  Chronic back pain and knee pain for over 10 years, diabetic, dialysis patient.  EXAM: LEFT KNEE - 1-2 VIEW  COMPARISON:  None.  FINDINGS: AP and lateral views of the LEFT knee are provided. There is mild tricompartmental degenerative joint disease, with mild joint space narrowings and mild osseous spurring. No large osteophytes or other signs of advanced degenerative joint disease.  No acute or suspicious osseous finding. 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 No convincing joint effusion. Extensive vascular calcifications within the surrounding soft tissues.  IMPRESSION: 1. No acute findings. 2. 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 3. Mild tricompartmental degenerative change. 4. Extensive vascular calcifications within the surrounding soft tissues.  Electronically Signed   By: Franki Cabot M.D.   On: 10/28/2017 16:55    Complexity Note: Imaging results reviewed. Results shared with Brian [name], using Layman's terms.                         Meds   Current Outpatient Medications:  .  albuterol (PROVENTIL HFA;VENTOLIN HFA) 108 (90 Base) MCG/ACT inhaler, Inhale 2 puffs into the lungs every 4 (four) hours as needed for wheezing or shortness of breath. , Disp: , Rfl:  .  aspirin EC 81 MG EC tablet, Take 1 tablet (81 mg total) by mouth daily., Disp: 30 tablet, Rfl: 0 .  atorvastatin (LIPITOR) 80 MG tablet, Take 80 mg daily by mouth., Disp: , Rfl:  .  carvedilol (COREG) 3.125 MG tablet, Take 1 tablet (3.125 mg total) by mouth 2 (two) times daily with a meal., Disp: 60 tablet, Rfl: 0 .  fluticasone  (FLOVENT HFA) 220 MCG/ACT inhaler, Inhale 2 puffs into the lungs 2 (two) times daily., Disp: , Rfl:  .  furosemide (LASIX) 40 MG tablet, Take 40 mg by mouth 2 (two) times daily. Take 40 mg by mouth daily on non dialysis days Tuesday, Thursday, Saturday and Sunday, Disp: , Rfl:  .  gabapentin (NEURONTIN) 300 MG capsule, Take 600 mg by mouth 3 (three) times daily., Disp: , Rfl:  .  ibuprofen (ADVIL,MOTRIN) 200 MG tablet, Take 400 mg by mouth daily as needed for headache or moderate pain. , Disp: , Rfl:  .  levothyroxine (SYNTHROID, LEVOTHROID) 50 MCG tablet, Take 50 mcg daily before breakfast by mouth., Disp: , Rfl:  .  lidocaine-prilocaine (EMLA) cream, Apply 1 application topically as needed (for port access). , Disp: , Rfl: 11 .  sertraline (ZOLOFT) 100 MG tablet, Take 100 mg by mouth daily., Disp: , Rfl:  .  sevelamer carbonate (RENVELA) 800 MG tablet, Take 800 mg by mouth 3 (three) times daily with meals., Disp: , Rfl:  .  vitamin C (ASCORBIC ACID) 500 MG tablet, Take 500 mg by mouth daily., Disp: , Rfl:  .  Vitamin D, Ergocalciferol, (DRISDOL) 50000 units CAPS capsule, Take 50,000 Units by mouth every Friday. , Disp: , Rfl:   ROS  Constitutional: Denies any fever or chills Gastrointestinal: No reported hemesis, hematochezia, vomiting, or acute GI distress Musculoskeletal: Denies any acute onset joint swelling, redness, loss of ROM, or weakness Neurological: No reported episodes of acute onset apraxia, aphasia, dysarthria, agnosia, amnesia, paralysis, loss of coordination, or loss of consciousness  Allergies  Brian [name] is allergic to naproxen.  PFSH  Drug: Brian [name]  reports that he does not use drugs. Alcohol:  reports that he does not drink alcohol. Tobacco:  reports that he has been smoking cigarettes. He has been smoking about 0.25 packs per day. He has never used smokeless tobacco. Medical:  has a past medical history of CHF (congestive heart failure) ([place]), COPD (chronic  obstructive pulmonary disease) ([place]), Diabetes mellitus type 2 in obese ([place]), DM (diabetes mellitus) type II controlled with renal manifestation ([place]), Hypercholesteremia, Hypertension, Morbid obesity with BMI of 45.0-49.9, adult ([place]), Myocardial infarction ([place]), Neuropathy, Pancreatitis, acute, Pneumonia, Sleep apnea, and Spinal stenosis. Surgical: Brian [name]  has a past surgical history that includes Amputation; Cholecystectomy (1998); laparoscopic appendectomy (N/A, 01/06/2015); DIALYSIS/PERMA CATHETER INSERTION (N/A, 01/03/2017); DIALYSIS/PERMA CATHETER REMOVAL (N/A, 01/24/2017); TEE without cardioversion (N/A, 01/28/2017); DIALYSIS/PERMA CATHETER INSERTION (N/A, 01/31/2017); Hernia repair; AV fistula placement (Left, 04/21/2017); and DIALYSIS/PERMA CATHETER REMOVAL (N/A, 08/02/2017). Family: family history includes Heart disease in his father and maternal grandfather; Hyperlipidemia in his mother; Hypertension in his mother.  Constitutional Exam  General appearance: Well nourished, well developed, and well hydrated. In no apparent acute distress Vitals:   11/16/17 0821  BP: 109/66  Pulse: 77  Temp: 98.3 F (36.8 C)  SpO2: 94%  Weight: 285 lb (129.3 kg)  Height: _0  (1.778 m)   BMI Assessment: Estimated body mass index is 40.89 kg/m as calculated from the following:   Height as of this encounter: _1  (1.778 m).   Weight as of this encounter: 285 lb (129.3 kg).  BMI interpretation table: BMI level Category Range association with higher incidence of chronic pain  <18 kg/m2 Underweight   18.5-24.9 kg/m2 Ideal body weight  25-29.9 kg/m2 Overweight Increased incidence by 20%  30-34.9 kg/m2 Obese (Class I) Increased incidence by 68%  35-39.9 kg/m2 Severe obesity (Class II) Increased incidence by 136%  >40 kg/m2 Extreme obesity (Class III) Increased incidence by 254%   Patient's current BMI Ideal Body weight  Body mass index is 40.89 kg/m. Ideal body weight: 73 kg (160 lb 15  oz) Adjusted ideal body weight: 95.5 kg (210 lb 9 oz)   BMI Readings from Last 4 Encounters:  11/16/17 40.89 kg/m  10/06/17 41.76 kg/m  04/21/17 42.33 kg/m  04/14/17 42.33 kg/m   Wt Readings from Last 4 Encounters:  11/16/17 285 lb (129.3 kg)  10/06/17 291 lb 0.1 oz (132 kg)  04/21/17 295 lb (133.8 kg)  04/14/17 295 lb (133.8 kg)  Psych/Mental status: Alert, oriented x 3 (person, place, & time)       Eyes: PERLA Respiratory: No evidence of acute respiratory distress  Cervical Spine Area Exam  Skin & Axial Inspection: No masses, redness, edema, swelling, or associated skin lesions Alignment: Symmetrical Functional ROM: Unrestricted ROM      Stability: No instability detected Muscle Tone/Strength: Functionally intact. No obvious neuro-muscular anomalies detected. Sensory (Neurological): Unimpaired Palpation: No palpable anomalies              Upper Extremity (UE) Exam    Side: Right upper extremity  Side: Left upper extremity  Skin & Extremity Inspection: Skin color, temperature, and hair growth are WNL. No peripheral edema or cyanosis. No masses, redness, swelling, asymmetry, or associated skin lesions. No contractures.  Skin & Extremity Inspection: Skin color, temperature, and hair growth are WNL. No peripheral edema or cyanosis. No masses, redness, swelling, asymmetry, or associated skin lesions. No contractures.  Functional ROM: Unrestricted ROM          Functional ROM: Unrestricted ROM          Muscle Tone/Strength: Functionally intact. No obvious neuro-muscular anomalies detected.  Muscle Tone/Strength: Functionally intact. No obvious neuro-muscular anomalies detected.  Sensory (Neurological): Unimpaired          Sensory (Neurological): Unimpaired          Palpation: No palpable anomalies              Palpation: No palpable anomalies              Provocative Test(s):  Phalen's test: deferred Tinel's test: deferred Apley's scratch test (touch opposite shoulder):  Action 1  (Across chest): deferred Action 2 (Overhead): deferred Action 3 (LB reach): deferred   Provocative Test(s):  Phalen's test: deferred Tinel's test: deferred Apley's scratch test (touch opposite shoulder):  Action 1 (Across chest): deferred Action 2 (Overhead): deferred Action 3 (LB reach): deferred    Thoracic Spine Area Exam  Skin & Axial Inspection: No masses, redness, or swelling Alignment: Symmetrical Functional ROM: Unrestricted ROM Stability: No instability detected Muscle Tone/Strength: Functionally intact. No obvious neuro-muscular anomalies detected. Sensory (Neurological): Unimpaired Muscle strength & Tone: No palpable anomalies  Lumbar Spine Area Exam  Skin & Axial Inspection: No masses, redness, or swelling Alignment: Symmetrical Functional ROM: Unrestricted ROM       Stability: No instability detected Muscle Tone/Strength: Functionally intact. No obvious neuro-muscular anomalies detected. Sensory (Neurological): Unimpaired Palpation: No palpable anomalies       Provocative Tests: Hyperextension/rotation test: deferred today       Lumbar quadrant test (Kemp's test): deferred today       Lateral bending test: deferred today       Patrick's Maneuver:  deferred today                   FABER test: deferred today                   S-I anterior distraction/compression test: deferred today         S-I lateral compression test: deferred today         S-I Thigh-thrust test: deferred today         S-I Gaenslen's test: deferred today          Gait & Posture Assessment  Ambulation: Patient came in today in a wheel chair Gait: Antalgic Posture: Difficulty standing up straight, due to pain   Lower Extremity Exam    Side: Right lower extremity  Side: Left lower extremity  Stability: No instability observed          Stability: No instability observed          Skin & Extremity Inspection: Skin color, temperature, and hair growth are WNL. No peripheral edema or cyanosis. No  masses, redness, swelling, asymmetry, or associated skin lesions. No contractures.  Skin & Extremity Inspection: Skin color, temperature, and hair growth are WNL. No peripheral edema or cyanosis. No masses, redness, swelling, asymmetry, or associated skin lesions. No contractures.  Functional ROM: Unrestricted ROM                  Functional ROM: Unrestricted ROM                  Muscle Tone/Strength: Functionally intact. No obvious neuro-muscular anomalies detected.  Muscle Tone/Strength: Functionally intact. No obvious neuro-muscular anomalies detected.  Sensory (Neurological): Unimpaired  Sensory (Neurological): Unimpaired  Palpation: No palpable anomalies  Palpation: No palpable anomalies   Assessment & Plan  Primary Diagnosis & Pertinent Problem List: The primary encounter diagnosis was Chronic pain syndrome. 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, Lumbar foraminal stenosis (Multilevel) (Right: L3-4), Lower extremity lymphedema (Bilateral), Chronic hip pain (Tertiary Area of Pain) (Left), Osteoarthritis of hip (Bilateral), Chronic knee pain (Fourth Area of Pain) (Bilateral) (L>R), Tricompartment osteoarthritis of knee (Left), Osteoarthritis of knees (Bilateral), Chronic musculoskeletal pain, Neurogenic pain, Neuropathy (HCC), Osteoarthritis of  AC (acromioclavicular) joint (Left), Disorder of skeletal system, Pharmacologic therapy, Opioid use, Problems influencing health  status, CKD (chronic kidney disease) stage 5, GFR less than 15 ml/min (HCC), ESRD on dialysis ([place]), Class 3 obesity with alveolar hypoventilation without serious comorbidity with body mass index (BMI) of 40.0 to 44.9 in adult [facility]), Obstructive sleep apnea, Tobacco use, Elevated sed rate, Vitamin D insufficiency, and Abnormal MRI, lumbar spine (06/26/2014) were also pertinent to this visit.  Visit Diagnosis: 1. Chronic pain syndrome   2. Chronic low back pain (Primary Area of Pain) (Bilateral) (R>L)   3. DDD (degenerative disc disease), lumbosacral   4. Lumbar Dextroscoliosis   5. Lumbar facet hypertrophy   6. Osteoarthritis of facet joint of lumbar spine   7. Spondylosis without myelopathy or radiculopathy, lumbosacral region   8. Lumbar facet syndrome (Bilateral)   9. Other intervertebral disc degeneration, lumbar region   10.  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 Chronic lower extremity pain (Secondary Area of Pain) (Bilateral) (R>L)   12. 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 Lumbar foraminal stenosis (Multilevel) (Right: L3-4)   14. Lower extremity lymphedema (Bilateral)   15. Chronic hip pain [facility] Area of Pain) (Left)   16. Osteoarthritis of hip (Bilateral)   17. Chronic knee pain (Fourth Area of Pain) (Bilateral) (L>R)   18. Tricompartment osteoarthritis of knee (Left)   19. Osteoarthritis of knees (Bilateral)   20. Chronic musculoskeletal pain   21. Neurogenic pain   22. Neuropathy ([place])   23. Osteoarthritis of  AC (acromioclavicular) joint (Left)   24. Disorder of skeletal system   25. Pharmacologic therapy   26. Opioid use   27. Problems influencing health status   28. CKD (chronic kidney disease) stage 5, GFR less than 15 ml/min (HCC)   29. ESRD on dialysis ([place])   30. Class 3 obesity with alveolar hypoventilation without serious comorbidity with body mass index (BMI) of 40.0 to 44.9 in adult ([place])   31.  Obstructive sleep apnea   32. Tobacco use   33. Elevated sed rate   34. Vitamin D insufficiency   35. Abnormal MRI, lumbar spine (06/26/2014)    Problems updated and reviewed during this visit: No problems updated. Time Note: Greater than 50% of the 40 minute(s) of face-to-face time spent with Brian [name], was spent in counseling/coordination of care regarding: the appropriate use of the pain scale, Brian [name] primary cause of pain, the results of his recent test(s), the significance of each one oth the test(s) anomalies and it's corresponding characteristic pain pattern(s), the treatment plan, treatment alternatives, the risks and possible complications of proposed treatment, realistic expectations, the goals of pain management (increased in functionality), the need to bring and keep the BMI below 30 and the need to collect and read the AVS material.  Plan of Care  Pharmacotherapy (Medications Ordered): No orders of the defined types were placed in this encounter.   Procedure Orders     LUMBAR FACET(MEDIAL BRANCH NERVE BLOCK) MBNB  Lab Orders     Cytochrome P450 2D6 Genotyping  Imaging Orders     DG Thoracic Spine 2 View Referral Orders  No referral(s) requested today   Pharmacological management options:  Opioid Analgesics: We'll take over management today. See above orders Membrane stabilizer: We have discussed the possibility of optimizing this mode of therapy, if tolerated Muscle relaxant: We have discussed the possibility of a trial NSAID: We have discussed the possibility of a trial Other analgesic(s): To be determined at a later time   Dialysis Patients and opioids [Aronoff 1999; Dean 2004]  Opioid Recommended Use Comment  Morphine Use cautiously and monitor patient for rebound pain effect or do not use. Both parent drug and metabolites can be removed with dialysis; watch for "rebound" pain effect.  Hydromorphone/ Hydrocodone Use cautiously and monitor patient carefully for  symptoms of opioid overdose. The parent drug can be removed, but metabolite accumulation is a risk.  Oxycodone Do not use. No data on oxycodone and its metabolites in dialysis.  Codeine Do not use. The parent drug and metabolites can accumulate causing adverse effects.  Methadone* Appears safe. Metabolites are inactive, but use caution because parent drug is not dialyzed.  Fentanyl* Appears safe. Metabolites are inactive, but use caution because fentanyl is poorly dialyzable.  Meperidine Do not use. Few data on meperidine and its metabolites  in dialysis; risk of adverse effects.  Propoxyphene Do not use. Propoxyphene is not dialyzed. Metabolites can accumulate causing increased risk of hypoglycemia, cardiac conduction problems, and CNS and respiratory depression [Almirall et al. Monia Sabal; [place]; Thornell Mule 2003; Manuella Ghazi et al. 2006].  * Use caution because these drugs are not dialyzable.   Interventional management options: Planned, scheduled, and/or pending:    1. Poor candidate for pharmacotherapy secondary to end-stage renal disease on dialysis, sleep apnea, tobacco abuse, and COPD.  In addition, technical difficulties monitoring use of opioid analgesics secondary to anuria. 2. High risk candidate for interventional therapies using steroids secondary to diabetes mellitus.   3. The patient requires to correct her vitamin D insufficiency. 4. The patient needs to bring down his BMI to less than 30 to minimize the effects of his excess weight on the spine. 5. Available x-rays would suggest wedging of the vertebral bodies in the thoracic spine, suggesting the possibility of fractures.  X-rays will be ordered. Diagnostic bilateral lumbar facet block #1 under fluoro and IV sedation.   Considering:   Diagnostic bilateral lumbar facet block  Possible bilateral lumbar facet RFA  Diagnostic T11-12 thoracic ESI  Diagnostic bilateral L2-3 transforaminal ESI  Diagnostic bilateral L3-4 transforaminal ESI   Diagnostic bilateral L4-5 transforaminal ESI  Diagnostic left-sided intra-articular hip joint injection  Diagnostic left-sided femoral nerve + obturator nerve block  Possible left-sided femoral nerve + obturator nerve RFA  Diagnostic bilateral intra-articular knee injection with local anesthetic and steroid  Possible series of 5 bilateral intra-articular Hyalgan knee injections  Diagnostic bilateral genicular nerve block  Possible bilateral genicular nerve RFA    PRN Procedures:   None at this time   Provider-requested follow-up: Return for Procedure (w/ sedation): (B) L-FCT BLK #1.  Future Appointments  Date Time Provider [place]  12/08/2017  9:45 AM Milinda Pointer, MD [facility] None  01/31/2018  2:00 PM [facility] 1 [facility] None  01/31/2018  3:00 PM Dew, Erskine Squibb, MD [facility] None    Primary Care Physician: Martin Majestic, FNP Location: [facility] Note by: Gaspar Cola, MD Date: 11/16/2017; Time: 9:35 AM

## 2017-11-15 DIAGNOSIS — R7309 Other abnormal glucose: Secondary | ICD-10-CM | POA: Insufficient documentation

## 2017-11-15 DIAGNOSIS — R700 Elevated erythrocyte sedimentation rate: Secondary | ICD-10-CM | POA: Insufficient documentation

## 2017-11-15 DIAGNOSIS — N185 Chronic kidney disease, stage 5: Secondary | ICD-10-CM | POA: Insufficient documentation

## 2017-11-16 ENCOUNTER — Other Ambulatory Visit: Payer: Self-pay

## 2017-11-16 ENCOUNTER — Encounter: Payer: Self-pay | Admitting: Pain Medicine

## 2017-11-16 ENCOUNTER — Ambulatory Visit: Payer: Medicare Other | Attending: Pain Medicine | Admitting: Pain Medicine

## 2017-11-16 VITALS — BP 109/66 | HR 77 | Temp 98.3°F | Ht 70.0 in | Wt 285.0 lb

## 2017-11-16 DIAGNOSIS — M5136 Other intervertebral disc degeneration, lumbar region: Secondary | ICD-10-CM | POA: Diagnosis not present

## 2017-11-16 DIAGNOSIS — G8929 Other chronic pain: Secondary | ICD-10-CM | POA: Diagnosis present

## 2017-11-16 DIAGNOSIS — N185 Chronic kidney disease, stage 5: Secondary | ICD-10-CM

## 2017-11-16 DIAGNOSIS — M79605 Pain in left leg: Secondary | ICD-10-CM | POA: Insufficient documentation

## 2017-11-16 DIAGNOSIS — M4804 Spinal stenosis, thoracic region: Secondary | ICD-10-CM

## 2017-11-16 DIAGNOSIS — I5032 Chronic diastolic (congestive) heart failure: Secondary | ICD-10-CM | POA: Diagnosis not present

## 2017-11-16 DIAGNOSIS — G894 Chronic pain syndrome: Secondary | ICD-10-CM | POA: Insufficient documentation

## 2017-11-16 DIAGNOSIS — M47816 Spondylosis without myelopathy or radiculopathy, lumbar region: Secondary | ICD-10-CM

## 2017-11-16 DIAGNOSIS — M25561 Pain in right knee: Secondary | ICD-10-CM | POA: Insufficient documentation

## 2017-11-16 DIAGNOSIS — M47817 Spondylosis without myelopathy or radiculopathy, lumbosacral region: Secondary | ICD-10-CM

## 2017-11-16 DIAGNOSIS — M545 Low back pain: Secondary | ICD-10-CM | POA: Insufficient documentation

## 2017-11-16 DIAGNOSIS — M79604 Pain in right leg: Secondary | ICD-10-CM | POA: Diagnosis not present

## 2017-11-16 DIAGNOSIS — Z992 Dependence on renal dialysis: Secondary | ICD-10-CM | POA: Insufficient documentation

## 2017-11-16 DIAGNOSIS — Z720 Tobacco use: Secondary | ICD-10-CM

## 2017-11-16 DIAGNOSIS — E669 Obesity, unspecified: Secondary | ICD-10-CM | POA: Diagnosis not present

## 2017-11-16 DIAGNOSIS — R700 Elevated erythrocyte sedimentation rate: Secondary | ICD-10-CM

## 2017-11-16 DIAGNOSIS — E1122 Type 2 diabetes mellitus with diabetic chronic kidney disease: Secondary | ICD-10-CM | POA: Insufficient documentation

## 2017-11-16 DIAGNOSIS — M160 Bilateral primary osteoarthritis of hip: Secondary | ICD-10-CM | POA: Diagnosis not present

## 2017-11-16 DIAGNOSIS — M4180 Other forms of scoliosis, site unspecified: Secondary | ICD-10-CM | POA: Diagnosis not present

## 2017-11-16 DIAGNOSIS — I132 Hypertensive heart and chronic kidney disease with heart failure and with stage 5 chronic kidney disease, or end stage renal disease: Secondary | ICD-10-CM | POA: Insufficient documentation

## 2017-11-16 DIAGNOSIS — G4733 Obstructive sleep apnea (adult) (pediatric): Secondary | ICD-10-CM

## 2017-11-16 DIAGNOSIS — M170 Bilateral primary osteoarthritis of knee: Secondary | ICD-10-CM | POA: Diagnosis not present

## 2017-11-16 DIAGNOSIS — Z789 Other specified health status: Secondary | ICD-10-CM

## 2017-11-16 DIAGNOSIS — M9983 Other biomechanical lesions of lumbar region: Secondary | ICD-10-CM

## 2017-11-16 DIAGNOSIS — N186 End stage renal disease: Secondary | ICD-10-CM | POA: Insufficient documentation

## 2017-11-16 DIAGNOSIS — F1190 Opioid use, unspecified, uncomplicated: Secondary | ICD-10-CM

## 2017-11-16 DIAGNOSIS — J449 Chronic obstructive pulmonary disease, unspecified: Secondary | ICD-10-CM | POA: Diagnosis not present

## 2017-11-16 DIAGNOSIS — M792 Neuralgia and neuritis, unspecified: Secondary | ICD-10-CM

## 2017-11-16 DIAGNOSIS — M25552 Pain in left hip: Secondary | ICD-10-CM | POA: Insufficient documentation

## 2017-11-16 DIAGNOSIS — M5137 Other intervertebral disc degeneration, lumbosacral region: Secondary | ICD-10-CM | POA: Diagnosis not present

## 2017-11-16 DIAGNOSIS — E662 Morbid (severe) obesity with alveolar hypoventilation: Secondary | ICD-10-CM

## 2017-11-16 DIAGNOSIS — Z79899 Other long term (current) drug therapy: Secondary | ICD-10-CM

## 2017-11-16 DIAGNOSIS — M7918 Myalgia, other site: Secondary | ICD-10-CM | POA: Diagnosis not present

## 2017-11-16 DIAGNOSIS — G629 Polyneuropathy, unspecified: Secondary | ICD-10-CM

## 2017-11-16 DIAGNOSIS — M19012 Primary osteoarthritis, left shoulder: Secondary | ICD-10-CM

## 2017-11-16 DIAGNOSIS — M25562 Pain in left knee: Secondary | ICD-10-CM | POA: Insufficient documentation

## 2017-11-16 DIAGNOSIS — M899 Disorder of bone, unspecified: Secondary | ICD-10-CM

## 2017-11-16 DIAGNOSIS — Z6841 Body Mass Index (BMI) 40.0 and over, adult: Secondary | ICD-10-CM

## 2017-11-16 DIAGNOSIS — M48061 Spinal stenosis, lumbar region without neurogenic claudication: Secondary | ICD-10-CM

## 2017-11-16 DIAGNOSIS — R937 Abnormal findings on diagnostic imaging of other parts of musculoskeletal system: Secondary | ICD-10-CM

## 2017-11-16 DIAGNOSIS — M1712 Unilateral primary osteoarthritis, left knee: Secondary | ICD-10-CM

## 2017-11-16 DIAGNOSIS — E559 Vitamin D deficiency, unspecified: Secondary | ICD-10-CM

## 2017-11-16 DIAGNOSIS — I890 Lymphedema, not elsewhere classified: Secondary | ICD-10-CM

## 2017-11-16 NOTE — Patient Instructions (Addendum)
____________________________________________________________________________________________  Pain Scale  Introduction: The pain score used by this practice is the Verbal Numerical Rating Scale (VNRS-11). This is an 11-point scale. It is for adults and children 10 years or older. There are significant differences in how the pain score is reported, used, and applied. Forget everything you learned in the past and learn this scoring system.  General Information: The scale should reflect your current level of pain. Unless you are specifically asked for the level of your worst pain, or your average pain. If you are asked for one of these two, then it should be understood that it is over the past 24 hours.  Basic Activities of Daily Living (ADL): Personal hygiene, dressing, eating, transferring, and using restroom.  Instructions: Most patients tend to report their level of pain as a combination of two factors, their physical pain and their psychosocial pain. This last one is also known as "suffering" and it is reflection of how physical pain affects you socially and psychologically. From now on, report them separately. From this point on, when asked to report your pain level, report only your physical pain. Use the following table for reference.  Pain Clinic Pain Levels (0-5/10)  Pain Level Score  Description  No Pain 0   Mild pain 1 Nagging, annoying, but does not interfere with basic activities of daily living (ADL). Patients are able to eat, bathe, get dressed, toileting (being able to get on and off the toilet and perform personal hygiene functions), transfer (move in and out of bed or a chair without assistance), and maintain continence (able to control bladder and bowel functions). Blood pressure and heart rate are unaffected. A normal heart rate for a healthy adult ranges from 60 to 100 bpm (beats per minute).   Mild to moderate pain 2 Noticeable and distracting. Impossible to hide from other  people. More frequent flare-ups. Still possible to adapt and function close to normal. It can be very annoying and may have occasional stronger flare-ups. With discipline, patients may get used to it and adapt.   Moderate pain 3 Interferes significantly with activities of daily living (ADL). It becomes difficult to feed, bathe, get dressed, get on and off the toilet or to perform personal hygiene functions. Difficult to get in and out of bed or a chair without assistance. Very distracting. With effort, it can be ignored when deeply involved in activities.   Moderately severe pain 4 Impossible to ignore for more than a few minutes. With effort, patients may still be able to manage work or participate in some social activities. Very difficult to concentrate. Signs of autonomic nervous system discharge are evident: dilated pupils (mydriasis); mild sweating (diaphoresis); sleep interference. Heart rate becomes elevated (>115 bpm). Diastolic blood pressure (lower number) rises above 100 mmHg. Patients find relief in laying down and not moving.   Severe pain 5 Intense and extremely unpleasant. Associated with frowning face and frequent crying. Pain overwhelms the senses.  Ability to do any activity or maintain social relationships becomes significantly limited. Conversation becomes difficult. Pacing back and forth is common, as getting into a comfortable position is nearly impossible. Pain wakes you up from deep sleep. Physical signs will be obvious: pupillary dilation; increased sweating; goosebumps; brisk reflexes; cold, clammy hands and feet; nausea, vomiting or dry heaves; loss of appetite; significant sleep disturbance with inability to fall asleep or to remain asleep. When persistent, significant weight loss is observed due to the complete loss of appetite and sleep deprivation.  Blood   pressure and heart rate becomes significantly elevated. Caution: If elevated blood pressure triggers a pounding headache  associated with blurred vision, then the patient should immediately seek attention at an urgent or emergency care unit, as these may be signs of an impending stroke.    Emergency Department Pain Levels (6-10/10)  Emergency Room Pain 6 Severely limiting. Requires emergency care and should not be seen or managed at an outpatient pain management facility. Communication becomes difficult and requires great effort. Assistance to reach the emergency department may be required. Facial flushing and profuse sweating along with potentially dangerous increases in heart rate and blood pressure will be evident.   Distressing pain 7 Self-care is very difficult. Assistance is required to transport, or use restroom. Assistance to reach the emergency department will be required. Tasks requiring coordination, such as bathing and getting dressed become very difficult.   Disabling pain 8 Self-care is no longer possible. At this level, pain is disabling. The individual is unable to do even the most "basic" activities such as walking, eating, bathing, dressing, transferring to a bed, or toileting. Fine motor skills are lost. It is difficult to think clearly.   Incapacitating pain 9 Pain becomes incapacitating. Thought processing is no longer possible. Difficult to remember your own name. Control of movement and coordination are lost.   The worst pain imaginable 10 At this level, most patients pass out from pain. When this level is reached, collapse of the autonomic nervous system occurs, leading to a sudden drop in blood pressure and heart rate. This in turn results in a temporary and dramatic drop in blood flow to the brain, leading to a loss of consciousness. Fainting is one of the body's self defense mechanisms. Passing out puts the brain in a calmed state and causes it to shut down for a while, in order to begin the healing process.    Summary: 1. Refer to this scale when providing us with your pain level. 2. Be  accurate and careful when reporting your pain level. This will help with your care. 3. Over-reporting your pain level will lead to loss of credibility. 4. Even a level of 1/10 means that there is pain and will be treated at our facility. 5. High, inaccurate reporting will be documented as "Symptom Exaggeration", leading to loss of credibility and suspicions of possible secondary gains such as obtaining more narcotics, or wanting to appear disabled, for fraudulent reasons. 6. Only pain levels of 5 or below will be seen at our facility. 7. Pain levels of 6 and above will be sent to the Emergency Department and the appointment cancelled. ____________________________________________________________________________________________   ____________________________________________________________________________________________  Preparing for Procedure with Sedation  Instructions: . Oral Intake: Do not eat or drink anything for at least 8 hours prior to your procedure. . Transportation: Public transportation is not allowed. Bring an adult driver. The driver must be physically present in our waiting room before any procedure can be started. . Physical Assistance: Bring an adult physically capable of assisting you, in the event you need help. This adult should keep you company at home for at least 6 hours after the procedure. . Blood Pressure Medicine: Take your blood pressure medicine with a sip of water the morning of the procedure. . Blood thinners: Notify our staff if you are taking any blood thinners. Depending on which one you take, there will be specific instructions on how and when to stop it. . Diabetics on insulin: Notify the staff so that you can be   scheduled 1st case in the morning. If your diabetes requires high dose insulin, take only  of your normal insulin dose the morning of the procedure and notify the staff that you have done so. . Preventing infections: Shower with an antibacterial soap  the morning of your procedure. . Build-up your immune system: Take 1000 mg of Vitamin C with every meal (3 times a day) the day prior to your procedure. Marland Kitchen Antibiotics: Inform the staff if you have a condition or reason that requires you to take antibiotics before dental procedures. . Pregnancy: If you are pregnant, call and cancel the procedure. . Sickness: If you have a cold, fever, or any active infections, call and cancel the procedure. . Arrival: You must be in the facility at least 30 minutes prior to your scheduled procedure. . Children: Do not bring children with you. . Dress appropriately: Bring dark clothing that you would not mind if they get stained. . Valuables: Do not bring any jewelry or valuables.  Procedure appointments are reserved for interventional treatments only. Marland Kitchen No Prescription Refills. . No medication changes will be discussed during procedure appointments. . No disability issues will be discussed.  Reasons to call and reschedule or cancel your procedure: (Following these recommendations will minimize the risk of a serious complication.) . Surgeries: Avoid having procedures within 2 weeks of any surgery. (Avoid for 2 weeks before or after any surgery). . Flu Shots: Avoid having procedures within 2 weeks of a flu shots or . (Avoid for 2 weeks before or after immunizations). . Barium: Avoid having a procedure within 7-10 days after having had a radiological study involving the use of radiological contrast. (Myelograms, Barium swallow or enema study). . Heart attacks: Avoid any elective procedures or surgeries for the initial 6 months after a "Myocardial Infarction" (Heart Attack). . Blood thinners: It is imperative that you stop these medications before procedures. Let us know if you if you take any blood thinner.  . Infection: Avoid procedures during or within two weeks of an infection (including chest colds or gastrointestinal problems). Symptoms associated with  infections include: Localized redness, fever, chills, night sweats or profuse sweating, burning sensation when voiding, cough, congestion, stuffiness, runny nose, sore throat, diarrhea, nausea, vomiting, cold or Flu symptoms, recent or current infections. It is specially important if the infection is over the area that we intend to treat. Marland Kitchen Heart and lung problems: Symptoms that may suggest an active cardiopulmonary problem include: cough, chest pain, breathing difficulties or shortness of breath, dizziness, ankle swelling, uncontrolled high or unusually low blood pressure, and/or palpitations. If you are experiencing any of these symptoms, cancel your procedure and contact your primary care physician for an evaluation.  Remember:  Regular Business hours are:  Monday to Thursday 8:00 AM to 4:00 PM  Provider's Schedule: Milinda Pointer, MD:  Procedure days: Tuesday and Thursday 7:30 AM to 4:00 PM  Gillis Santa, MD:  Procedure days: Monday and Wednesday 7:30 AM to 4:00 PM ____________________________________________________________________________________________   ____________________________________________________________________________________________  General Risks and Possible Complications  Patient Responsibilities: It is important that you read this as it is part of your informed consent. It is our duty to inform you of the risks and possible complications associated with treatments offered to you. It is your responsibility as a patient to read this and to ask questions about anything that is not clear or that you believe was not covered in this document.  Patient's Rights: You have the right to refuse treatment. You also  have the right to change your mind, even after initially having agreed to have the treatment done. However, under this last option, if you wait until the last second to change your mind, you may be charged for the materials used up to that point.  Introduction:  Medicine is not an Chief Strategy Officer. Everything in Medicine, including the lack of treatment(s), carries the potential for danger, harm, or loss (which is by definition: Risk). In Medicine, a complication is a secondary problem, condition, or disease that can aggravate an already existing one. All treatments carry the risk of possible complications. The fact that a side effects or complications occurs, does not imply that the treatment was conducted incorrectly. It must be clearly understood that these can happen even when everything is done following the highest safety standards.  No treatment: You can choose not to proceed with the proposed treatment alternative. The "PRO(s)" would include: avoiding the risk of complications associated with the therapy. The "CON(s)" would include: not getting any of the treatment benefits. These benefits fall under one of three categories: diagnostic; therapeutic; and/or palliative. Diagnostic benefits include: getting information which can ultimately lead to improvement of the disease or symptom(s). Therapeutic benefits are those associated with the successful treatment of the disease. Finally, palliative benefits are those related to the decrease of the primary symptoms, without necessarily curing the condition (example: decreasing the pain from a flare-up of a chronic condition, such as incurable terminal cancer).  General Risks and Complications: These are associated to most interventional treatments. They can occur alone, or in combination. They fall under one of the following six (6) categories: no benefit or worsening of symptoms; bleeding; infection; nerve damage; allergic reactions; and/or death. 1. No benefits or worsening of symptoms: In Medicine there are no guarantees, only probabilities. No healthcare provider can ever guarantee that a medical treatment will work, they can only state the probability that it may. Furthermore, there is always the possibility that the  condition may worsen, either directly, or indirectly, as a consequence of the treatment. 2. Bleeding: This is more common if the patient is taking a blood thinner, either prescription or over the counter (example: Goody Powders, Fish oil, Aspirin, Garlic, etc.), or if suffering a condition associated with impaired coagulation (example: Hemophilia, cirrhosis of the liver, low platelet counts, etc.). However, even if you do not have one on these, it can still happen. If you have any of these conditions, or take one of these drugs, make sure to notify your treating physician. 3. Infection: This is more common in patients with a compromised immune system, either due to disease (example: diabetes, cancer, human immunodeficiency virus [HIV], etc.), or due to medications or treatments (example: therapies used to treat cancer and rheumatological diseases). However, even if you do not have one on these, it can still happen. If you have any of these conditions, or take one of these drugs, make sure to notify your treating physician. 4. Nerve Damage: This is more common when the treatment is an invasive one, but it can also happen with the use of medications, such as those used in the treatment of cancer. The damage can occur to small secondary nerves, or to large primary ones, such as those in the spinal cord and brain. This damage may be temporary or permanent and it may lead to impairments that can range from temporary numbness to permanent paralysis and/or brain death. 5. Allergic Reactions: Any time a substance or material comes in contact with our  body, there is the possibility of an allergic reaction. These can range from a mild skin rash (contact dermatitis) to a severe systemic reaction (anaphylactic reaction), which can result in death. 6. Death: In general, any medical intervention can result in death, most of the time due to an unforeseen  complication. ____________________________________________________________________________________________  ____________________________________________________________________________________________  Medication Rules  Applies to: All patients receiving prescriptions (written or electronic).  Pharmacy of record: Pharmacy where electronic prescriptions will be sent. If written prescriptions are taken to a different pharmacy, please inform the nursing staff. The pharmacy listed in the electronic medical record should be the one where you would like electronic prescriptions to be sent.  Prescription refills: Only during scheduled appointments. Applies to both, written and electronic prescriptions.  NOTE: The following applies primarily to controlled substances (Opioid* Pain Medications).   Patient's responsibilities: 1. Pain Pills: Bring all pain pills to every appointment (except for procedure appointments). 2. Pill Bottles: Bring pills in original pharmacy bottle. Always bring newest bottle. Bring bottle, even if empty. 3. Medication refills: You are responsible for knowing and keeping track of what medications you need refilled. The day before your appointment, write a list of all prescriptions that need to be refilled. Bring that list to your appointment and give it to the admitting nurse. Prescriptions will be written only during appointments. If you forget a medication, it will not be "Called in", "Faxed", or "electronically sent". You will need to get another appointment to get these prescribed. 4. Prescription Accuracy: You are responsible for carefully inspecting your prescriptions before leaving our office. Have the discharge nurse carefully go over each prescription with you, before taking them home. Make sure that your name is accurately spelled, that your address is correct. Check the name and dose of your medication to make sure it is accurate. Check the number of pills, and the  written instructions to make sure they are clear and accurate. Make sure that you are given enough medication to last until your next medication refill appointment. 5. Taking Medication: Take medication as prescribed. Never take more pills than instructed. Never take medication more frequently than prescribed. Taking less pills or less frequently is permitted and encouraged, when it comes to controlled substances (written prescriptions).  6. Inform other Doctors: Always inform, all of your healthcare providers, of all the medications you take. 7. Pain Medication from other Providers: You are not allowed to accept any additional pain medication from any other Doctor or Healthcare provider. There are two exceptions to this rule. (see below) In the event that you require additional pain medication, you are responsible for notifying us, as stated below. 8. Medication Agreement: You are responsible for carefully reading and following our Medication Agreement. This must be signed before receiving any prescriptions from our practice. Safely store a copy of your signed Agreement. Violations to the Agreement will result in no further prescriptions. (Additional copies of our Medication Agreement are available upon request.) 9. Laws, Rules, & Regulations: All patients are expected to follow all Federal and [employer], [employer], Rules, [employer]. Ignorance of the Laws does not constitute a valid excuse. The use of any illegal substances is prohibited. 10. Adopted CDC guidelines & recommendations: Target dosing levels will be at or below 60 MME/day. Use of benzodiazepines** is not recommended.  Exceptions: There are only two exceptions to the rule of not receiving pain medications from other Healthcare Providers. 1. Exception #1 (Emergencies): In the event of an emergency (i.e.: accident requiring emergency care), you  are allowed to receive additional pain medication. However, you are responsible for: As soon as you  are able, call our office [phone], at any time of the day or night, and leave a message stating your name, the date and nature of the emergency, and the name and dose of the medication prescribed. In the event that your call is answered by a member of our staff, make sure to document and save the date, time, and the name of the person that took your information.  2. Exception #2 (Planned Surgery): In the event that you are scheduled by another doctor or dentist to have any type of surgery or procedure, you are allowed (for a period no longer than 30 days), to receive additional pain medication, for the acute post-op pain. However, in this case, you are responsible for picking up a copy of our "Post-op Pain Management for Surgeons" handout, and giving it to your surgeon or dentist. This document is available at our office, and does not require an appointment to obtain it. Simply go to our office during business hours (Monday-Thursday from 8:00 AM to 4:00 PM) (Friday 8:00 AM to 12:00 Noon) or if you have a scheduled appointment with [place], prior to your surgery, and ask for it by name. In addition, you will need to provide [place] with your name, name of your surgeon, type of surgery, and date of procedure or surgery.  *Opioid medications include: morphine, codeine, oxycodone, oxymorphone, hydrocodone, hydromorphone, meperidine, tramadol, tapentadol, buprenorphine, fentanyl, methadone. **Benzodiazepine medications include: diazepam (Valium), alprazolam (Xanax), clonazepam (Klonopine), lorazepam (Ativan), clorazepate (Tranxene), chlordiazepoxide (Librium), estazolam (Prosom), oxazepam (Serax), temazepam (Restoril), triazolam (Halcion) (Last updated: 04/21/2017) ____________________________________________________________________________________________   ____________________________________________________________________________________________  Medication Recommendations and Reminders  Applies to: All  patients receiving prescriptions (written and/or electronic).  Medication Rules & Regulations: These rules and regulations exist for your safety and that of others. They are not flexible and neither are we. Dismissing or ignoring them will be considered "non-compliance" with medication therapy, resulting in complete and irreversible termination of such therapy. (See document titled "Medication Rules" for more details.) In all conscience, because of safety reasons, we cannot continue providing a therapy where the patient does not follow instructions.  Pharmacy of record:   Definition: This is the pharmacy where your electronic prescriptions will be sent.   We do not endorse any particular pharmacy.  You are not restricted in your choice of pharmacy.  The pharmacy listed in the electronic medical record should be the one where you want electronic prescriptions to be sent.  If you choose to change pharmacy, simply notify our nursing staff of your choice of new pharmacy.  Recommendations:  Keep all of your pain medications in a safe place, under lock and key, even if you live alone.   After you fill your prescription, take 1 week's worth of pills and put them away in a safe place. You should keep a separate, properly labeled bottle for this purpose. The remainder should be kept in the original bottle. Use this as your primary supply, until it runs out. Once it's gone, then you know that you have 1 week's worth of medicine, and it is time to come in for a prescription refill. If you do this correctly, it is unlikely that you will ever run out of medicine.  To make sure that the above recommendation works, it is very important that you make sure your medication refill appointments are scheduled at least 1 week before you run out of  medicine. To do this in an effective manner, make sure that you do not leave the office without scheduling your next medication management appointment. Always ask the  nursing staff to show you in your prescription , when your medication will be running out. Then arrange for the receptionist to get you a return appointment, at least 7 days before you run out of medicine. Do not wait until you have 1 or 2 pills left, to come in. This is very poor planning and does not take into consideration that we may need to cancel appointments due to bad weather, sickness, or emergencies affecting our staff.  "Partial Fill": If for any reason your pharmacy does not have enough pills/tablets to completely fill or refill your prescription, do not allow for a "partial fill". You will need a separate prescription to fill the remaining amount, which we will not provide. If the reason for the partial fill is your insurance, you will need to talk to the pharmacist about payment alternatives for the remaining tablets, but again, do not accept a partial fill.  Prescription refills and/or changes in medication(s):   Prescription refills, and/or changes in dose or medication, will be conducted only during scheduled medication management appointments. (Applies to both, written and electronic prescriptions.)  No refills on procedure days. No medication will be changed or started on procedure days. No changes, adjustments, and/or refills will be conducted on a procedure day. Doing so will interfere with the diagnostic portion of the procedure.  No phone refills. No medications will be "called into the pharmacy".  No Fax refills.  No weekend refills.  No Holliday refills.  No after hours refills.  Remember:  Business hours are:  Monday to Thursday 8:00 AM to 4:00 PM Provider's Schedule: Dionisio David, NP - Appointments are:  Medication management: Monday to Thursday 8:00 AM to 4:00 PM Milinda Pointer, MD - Appointments are:  Medication management: Monday and Wednesday 8:00 AM to 4:00 PM Procedure day: Tuesday and Thursday 7:30 AM to 4:00 PM Gillis Santa, MD - Appointments are:   Medication management: Tuesday and Thursday 8:00 AM to 4:00 PM Procedure day: Monday and Wednesday 7:30 AM to 4:00 PM (Last update: 04/21/2017) ____________________________________________________________________________________________   ____________________________________________________________________________________________  CANNABIDIOL (AKA: CBD Oil or Pills)  Applies to: All patients receiving prescriptions of controlled substances (written and/or electronic).  General Information: Cannabidiol (CBD) was discovered in 36. It is one of some 113 identified cannabinoids in cannabis (Marijuana) plants, accounting for up to 40% of the plant's extract. As of 2018, preliminary clinical research on cannabidiol included studies of anxiety, cognition, movement disorders, and pain.  Cannabidiol is consummed in multiple ways, including inhalation of cannabis smoke or vapor, as an aerosol spray into the cheek, and by mouth. It may be supplied as CBD oil containing CBD as the active ingredient (no added tetrahydrocannabinol (THC) or terpenes), a full-plant CBD-dominant hemp extract oil, capsules, dried cannabis, or as a liquid solution. CBD is thought not have the same psychoactivity as THC, and may affect the actions of THC. Studies suggest that CBD may interact with different biological targets, including cannabinoid receptors and other neurotransmitter receptors. As of 2018 the mechanism of action for its biological effects has not been determined.  In the [place], cannabidiol has a limited approval by the Food and Drug Administration (FDA) for treatment of only two types of epilepsy disorders. The side effects of long-term use of the drug include somnolence, decreased appetite, diarrhea, fatigue, malaise, weakness, sleeping problems, and  others.  CBD remains a Schedule I drug prohibited for any use.  Legality: Some manufacturers ship CBD products nationally, an illegal action which the FDA  has not enforced in 2018, with CBD remaining the subject of an FDA investigational new drug evaluation, and is not considered legal as a dietary supplement or food ingredient as of December 2018. Federal illegality has made it difficult historically to conduct research on CBD. CBD is openly sold in head shops and health food stores in some states where such sales have not been explicitly legalized.  Warning: Because it is not FDA approved for general use or treatment of pain, it is not required to undergo the same manufacturing controls as prescription drugs.  This means that the available cannabidiol (CBD) may be contaminated with THC.  If this is the case, it will trigger a positive urine drug screen (UDS) test for cannabinoids (Marijuana).  Because a positive UDS for illicit substances is a violation of our medication agreement, your opioid analgesics (pain medicine) may be permanently discontinued. (Last update: 05/12/2017) ____________________________________________________________________________________________

## 2017-11-23 NOTE — Addendum Note (Signed)
Addended by: Milinda Pointer A on: 11/23/2017 04:12 PM   Modules accepted: Orders

## 2017-12-06 ENCOUNTER — Ambulatory Visit
Admission: RE | Admit: 2017-12-06 | Discharge: 2017-12-06 | Disposition: A | Discharge status: Home / Self Care | Payer: Medicare Other | Source: Ambulatory Visit | Attending: Pain Medicine | Admitting: Pain Medicine

## 2017-12-06 DIAGNOSIS — M419 Scoliosis, unspecified: Secondary | ICD-10-CM | POA: Diagnosis not present

## 2017-12-06 DIAGNOSIS — M545 Low back pain, unspecified: Secondary | ICD-10-CM

## 2017-12-06 DIAGNOSIS — G8929 Other chronic pain: Secondary | ICD-10-CM | POA: Diagnosis not present

## 2017-12-06 DIAGNOSIS — M4804 Spinal stenosis, thoracic region: Secondary | ICD-10-CM | POA: Diagnosis present

## 2017-12-06 DIAGNOSIS — M8580 Other specified disorders of bone density and structure, unspecified site: Secondary | ICD-10-CM | POA: Insufficient documentation

## 2017-12-08 ENCOUNTER — Other Ambulatory Visit: Payer: Self-pay

## 2017-12-08 ENCOUNTER — Ambulatory Visit (HOSPITAL_BASED_OUTPATIENT_CLINIC_OR_DEPARTMENT_OTHER): Payer: Medicare Other | Admitting: Pain Medicine

## 2017-12-08 ENCOUNTER — Ambulatory Visit
Admission: RE | Admit: 2017-12-08 | Discharge: 2017-12-08 | Disposition: A | Discharge status: Home / Self Care | Payer: Medicare Other | Source: Ambulatory Visit | Attending: Pain Medicine | Admitting: Pain Medicine

## 2017-12-08 ENCOUNTER — Encounter: Payer: Self-pay | Admitting: Pain Medicine

## 2017-12-08 VITALS — BP 110/64 | HR 61 | Temp 97.6°F | Resp 14 | Ht 70.0 in | Wt 285.0 lb

## 2017-12-08 DIAGNOSIS — M47817 Spondylosis without myelopathy or radiculopathy, lumbosacral region: Secondary | ICD-10-CM | POA: Insufficient documentation

## 2017-12-08 DIAGNOSIS — G8929 Other chronic pain: Secondary | ICD-10-CM

## 2017-12-08 DIAGNOSIS — M47816 Spondylosis without myelopathy or radiculopathy, lumbar region: Secondary | ICD-10-CM | POA: Diagnosis not present

## 2017-12-08 DIAGNOSIS — M545 Low back pain: Secondary | ICD-10-CM

## 2017-12-08 MED ORDER — FENTANYL CITRATE (PF) 100 MCG/2ML IJ SOLN
25.0000 ug | INTRAMUSCULAR | Status: DC | PRN
  Administered 2017-12-08: 50 ug via INTRAVENOUS
  Filled 2017-12-08: qty 2

## 2017-12-08 MED ORDER — TRIAMCINOLONE ACETONIDE 40 MG/ML IJ SUSP
80.0000 mg | Freq: Once | INTRAMUSCULAR | Status: AC
  Administered 2017-12-08: 80 mg
  Filled 2017-12-08: qty 2

## 2017-12-08 MED ORDER — LACTATED RINGERS IV SOLN
1000.0000 mL | Freq: Once | INTRAVENOUS | Status: AC
  Administered 2017-12-08: 1000 mL via INTRAVENOUS

## 2017-12-08 MED ORDER — MIDAZOLAM HCL 5 MG/5ML IJ SOLN
1.0000 mg | INTRAMUSCULAR | Status: DC | PRN
  Administered 2017-12-08: 2 mg via INTRAVENOUS
  Filled 2017-12-08: qty 5

## 2017-12-08 MED ORDER — LIDOCAINE HCL 2 % IJ SOLN
20.0000 mL | Freq: Once | INTRAMUSCULAR | Status: AC
  Administered 2017-12-08: 400 mg
  Filled 2017-12-08: qty 40

## 2017-12-08 MED ORDER — ROPIVACAINE HCL 2 MG/ML IJ SOLN
18.0000 mL | Freq: Once | INTRAMUSCULAR | Status: AC
  Administered 2017-12-08: 18 mL via PERINEURAL
  Filled 2017-12-08: qty 20

## 2017-12-08 MED ORDER — TRIAMCINOLONE ACETONIDE 40 MG/ML IJ SUSP
INTRAMUSCULAR | Status: AC
  Filled 2017-12-08: qty 1

## 2017-12-08 NOTE — Progress Notes (Signed)
Patient's Name: Brian [name]  MRN: [id number]  Referring Provider: Martin Majestic, *  DOB: November 25, 1960  PCP: Martin Majestic, FNP  DOS: 12/08/2017  Note by: Gaspar Cola, MD  Service setting: Ambulatory outpatient  Specialty: Interventional Pain Management  Patient type: Established  Location: [facility] (AMB) Pain Management Facility  Visit type: Interventional Procedure   Primary Reason for Visit: Interventional Pain Management Treatment. CC: Back Pain (lower)  Procedure:          Anesthesia, Analgesia, Anxiolysis:  Type: Lumbar Facet, Medial Branch Block(s) #1  Primary Purpose: Diagnostic Region: Posterolateral Lumbosacral Spine Level: L2, L3, L4, L5, & S1 Medial Branch Level(s). Injecting these levels blocks the L3-4, L4-5, and L5-S1 lumbar facet joints. Laterality: Bilateral  Type: Moderate (Conscious) Sedation combined with Local Anesthesia Indication(s): Analgesia and Anxiety Route: Intravenous (IV) IV Access: Secured Sedation: Meaningful verbal contact was maintained at all times during the procedure  Local Anesthetic: Lidocaine 1-2%  Position: Prone   Indications: 1. Spondylosis without myelopathy or radiculopathy, lumbosacral region   2. Osteoarthritis of facet joint of lumbar spine   3. Lumbar facet syndrome (Bilateral)   4. Lumbar facet hypertrophy   5. Chronic low back pain (Primary Area of Pain) (Bilateral) (R>L)    Pain Score: Pre-procedure: 3 /10 Post-procedure: 1 /10  Pre-op Assessment:  Brian [name] is a 57 y.o. (year old), male patient, seen today for interventional treatment. He  has a past surgical history that includes Amputation; Cholecystectomy (1998); laparoscopic appendectomy (N/A, 01/06/2015); DIALYSIS/PERMA CATHETER INSERTION (N/A, 01/03/2017); DIALYSIS/PERMA CATHETER REMOVAL (N/A, 01/24/2017); TEE without cardioversion (N/A, 01/28/2017); DIALYSIS/PERMA CATHETER INSERTION (N/A, 01/31/2017); Hernia repair; AV fistula placement (Left,  04/21/2017); and DIALYSIS/PERMA CATHETER REMOVAL (N/A, 08/02/2017). Brian [name] has a current medication list which includes the following prescription(s): albuterol, aspirin, atorvastatin, carvedilol, fluticasone, furosemide, gabapentin, ibuprofen, levothyroxine, lidocaine-prilocaine, losartan, sertraline, sevelamer carbonate, vitamin c, and vitamin d (ergocalciferol), and the following Facility-Administered Medications: fentanyl and midazolam. His primarily concern today is the Back Pain (lower)  Initial Vital Signs:  Pulse/HCG Rate: 69ECG Heart Rate: 67 Temp: 97.9 F (36.6 C) Resp: 18 BP: 110/64 SpO2: 100 %  BMI: Estimated body mass index is 40.89 kg/m as calculated from the following:   Height as of this encounter: 5\' 10"  (1.778 m).   Weight as of this encounter: 285 lb (129.3 kg).  Risk Assessment: Allergies: Reviewed. He is allergic to naproxen.  Allergy Precautions: None required Coagulopathies: Reviewed. None identified.  Blood-thinner therapy: None at this time Active Infection(s): Reviewed. None identified. Brian [name] is afebrile  Site Confirmation: Brian [name] was asked to confirm the procedure and laterality before marking the site Procedure checklist: Completed Consent: Before the procedure and under the influence of no sedative(s), amnesic(s), or anxiolytics, the patient was informed of the treatment options, risks and possible complications. To fulfill our ethical and legal obligations, as recommended by the American Medical Association's Code of Ethics, I have informed the patient of my clinical impression; the nature and purpose of the treatment or procedure; the risks, benefits, and possible complications of the intervention; the alternatives, including doing nothing; the risk(s) and benefit(s) of the alternative treatment(s) or procedure(s); and the risk(s) and benefit(s) of doing nothing. The patient was provided information about the general risks and possible complications  associated with the procedure. These may include, but are not limited to: failure to achieve desired goals, infection, bleeding, organ or nerve damage, allergic reactions, paralysis, and death. In addition, the patient was informed of those risks  and complications associated to Spine-related procedures, such as failure to decrease pain; infection (i.e.: Meningitis, epidural or intraspinal abscess); bleeding (i.e.: epidural hematoma, subarachnoid hemorrhage, or any other type of intraspinal or peri-dural bleeding); organ or nerve damage (i.e.: Any type of peripheral nerve, nerve root, or spinal cord injury) with subsequent damage to sensory, motor, and/or autonomic systems, resulting in permanent pain, numbness, and/or weakness of one or several areas of the body; allergic reactions; (i.e.: anaphylactic reaction); and/or death. Furthermore, the patient was informed of those risks and complications associated with the medications. These include, but are not limited to: allergic reactions (i.e.: anaphylactic or anaphylactoid reaction(s)); adrenal axis suppression; blood sugar elevation that in diabetics may result in ketoacidosis or comma; water retention that in patients with history of congestive heart failure may result in shortness of breath, pulmonary edema, and decompensation with resultant heart failure; weight gain; swelling or edema; medication-induced neural toxicity; particulate matter embolism and blood vessel occlusion with resultant organ, and/or nervous system infarction; and/or aseptic necrosis of one or more joints. Finally, the patient was informed that Medicine is not an exact science; therefore, there is also the possibility of unforeseen or unpredictable risks and/or possible complications that may result in a catastrophic outcome. The patient indicated having understood very clearly. We have given the patient no guarantees and we have made no promises. Enough time was given to the patient to  ask questions, all of which were answered to the patient's satisfaction. Brian [name] has indicated that he wanted to continue with the procedure. Attestation: I, the ordering provider, attest that I have discussed with the patient the benefits, risks, side-effects, alternatives, likelihood of achieving goals, and potential problems during recovery for the procedure that I have provided informed consent. Date  Time: 12/08/2017  9:39 AM  Pre-Procedure Preparation:  Monitoring: As per clinic protocol. Respiration, ETCO2, SpO2, BP, heart rate and rhythm monitor placed and checked for adequate function Safety Precautions: Patient was assessed for positional comfort and pressure points before starting the procedure. Time-out: I initiated and conducted the "Time-out" before starting the procedure, as per protocol. The patient was asked to participate by confirming the accuracy of the "Time Out" information. Verification of the correct person, site, and procedure were performed and confirmed by me, the nursing staff, and the patient. "Time-out" conducted as per Joint Commission's Universal Protocol (UP.01.01.01). Time: 1109  Description of Procedure:          Laterality: Bilateral. The procedure was performed in identical fashion on both sides. Levels:  L2, L3, L4, L5, & S1 Medial Branch Level(s) Area Prepped: Posterior Lumbosacral Region Prepping solution: ChloraPrep (2% chlorhexidine gluconate and 70% isopropyl alcohol) Safety Precautions: Aspiration looking for blood return was conducted prior to all injections. At no point did we inject any substances, as a needle was being advanced. Before injecting, the patient was told to immediately notify me if he was experiencing any new onset of "ringing in the ears, or metallic taste in the mouth". No attempts were made at seeking any paresthesias. Safe injection practices and needle disposal techniques used. Medications properly checked for expiration dates. SDV  (single dose vial) medications used. After the completion of the procedure, all disposable equipment used was discarded in the proper designated medical waste containers. Local Anesthesia: Protocol guidelines were followed. The patient was positioned over the fluoroscopy table. The area was prepped in the usual manner. The time-out was completed. The target area was identified using fluoroscopy. A 12-in long, straight, sterile hemostat was  used with fluoroscopic guidance to locate the targets for each level blocked. Once located, the skin was marked with an approved surgical skin marker. Once all sites were marked, the skin (epidermis, dermis, and hypodermis), as well as deeper tissues (fat, connective tissue and muscle) were infiltrated with a small amount of a short-acting local anesthetic, loaded on a 10cc syringe with a 25G, 1.5-in  Needle. An appropriate amount of time was allowed for local anesthetics to take effect before proceeding to the next step. Local Anesthetic: Lidocaine 2.0% The unused portion of the local anesthetic was discarded in the proper designated containers. Technical explanation of process:  L2 Medial Branch Nerve Block (MBB): The target area for the L2 medial branch is at the junction of the postero-lateral aspect of the superior articular process and the superior, posterior, and medial edge of the transverse process of L3. Under fluoroscopic guidance, a Quincke needle was inserted until contact was made with os over the superior postero-lateral aspect of the pedicular shadow (target area). After negative aspiration for blood, 0.5 mL of the nerve block solution was injected without difficulty or complication. The needle was removed intact. L3 Medial Branch Nerve Block (MBB): The target area for the L3 medial branch is at the junction of the postero-lateral aspect of the superior articular process and the superior, posterior, and medial edge of the transverse process of L4. Under  fluoroscopic guidance, a Quincke needle was inserted until contact was made with os over the superior postero-lateral aspect of the pedicular shadow (target area). After negative aspiration for blood, 0.5 mL of the nerve block solution was injected without difficulty or complication. The needle was removed intact. L4 Medial Branch Nerve Block (MBB): The target area for the L4 medial branch is at the junction of the postero-lateral aspect of the superior articular process and the superior, posterior, and medial edge of the transverse process of L5. Under fluoroscopic guidance, a Quincke needle was inserted until contact was made with os over the superior postero-lateral aspect of the pedicular shadow (target area). After negative aspiration for blood, 0.5 mL of the nerve block solution was injected without difficulty or complication. The needle was removed intact. L5 Medial Branch Nerve Block (MBB): The target area for the L5 medial branch is at the junction of the postero-lateral aspect of the superior articular process and the superior, posterior, and medial edge of the sacral ala. Under fluoroscopic guidance, a Quincke needle was inserted until contact was made with os over the superior postero-lateral aspect of the pedicular shadow (target area). After negative aspiration for blood, 0.5 mL of the nerve block solution was injected without difficulty or complication. The needle was removed intact. S1 Medial Branch Nerve Block (MBB): The target area for the S1 medial branch is at the posterior and inferior 6 o'clock position of the L5-S1 facet joint. Under fluoroscopic guidance, the Quincke needle inserted for the L5 MBB was redirected until contact was made with os over the inferior and postero aspect of the sacrum, at the 6 o' clock position under the L5-S1 facet joint (Target area). After negative aspiration for blood, 0.5 mL of the nerve block solution was injected without difficulty or complication. The  needle was removed intact. Procedural Needles: 22-gauge, 3.5-inch, Quincke needles used for all levels. Nerve block solution: 0.2% PF-Ropivacaine + Triamcinolone (40 mg/mL) diluted to a final concentration of 4 mg of Triamcinolone/mL of Ropivacaine The unused portion of the solution was discarded in the proper designated containers.  Once the  entire procedure was completed, the treated area was cleaned, making sure to leave some of the prepping solution back to take advantage of its long term bactericidal properties.   Illustration of the posterior view of the lumbar spine and the posterior neural structures. Laminae of L2 through S1 are labeled. DPRL5, dorsal primary ramus of L5; DPRS1, dorsal primary ramus of S1; DPR3, dorsal primary ramus of L3; FJ, facet (zygapophyseal) joint L3-L4; I, inferior articular process of L4; LB1, lateral branch of dorsal primary ramus of L1; IAB, inferior articular branches from L3 medial branch (supplies L4-L5 facet joint); IBP, intermediate branch plexus; MB3, medial branch of dorsal primary ramus of L3; NR3, third lumbar nerve root; S, superior articular process of L5; SAB, superior articular branches from L4 (supplies L4-5 facet joint also); TP3, transverse process of L3.  Vitals:   12/08/17 1130 12/08/17 1140 12/08/17 1150 12/08/17 1200  BP: (!) 104/58 (!) 109/46 (!) 106/55 110/64  Pulse: 61     Resp: 15 18 10 14   Temp:  97.8 F (36.6 C)  97.6 F (36.4 C)  TempSrc:      SpO2: 100% 96% 98% 98%  Weight:      Height:         Start Time: 1109 hrs. End Time: 1125 hrs.  Imaging Guidance (Spinal):          Type of Imaging Technique: Fluoroscopy Guidance (Spinal) Indication(s): Assistance in needle guidance and placement for procedures requiring needle placement in or near specific anatomical locations not easily accessible without such assistance. Exposure Time: Please see nurses notes. Contrast: None used. Fluoroscopic Guidance: I was personally present  during the use of fluoroscopy. "Tunnel Vision Technique" used to obtain the best possible view of the target area. Parallax error corrected before commencing the procedure. "Direction-depth-direction" technique used to introduce the needle under continuous pulsed fluoroscopy. Once target was reached, antero-posterior, oblique, and lateral fluoroscopic projection used confirm needle placement in all planes. Images permanently stored in EMR. Interpretation: No contrast injected. I personally interpreted the imaging intraoperatively. Adequate needle placement confirmed in multiple planes. Permanent images saved into the patient's record.  Antibiotic Prophylaxis:   Anti-infectives (From admission, onward)   None     Indication(s): None identified  Post-operative Assessment:  Post-procedure Vital Signs:  Pulse/HCG Rate: 6170 Temp: 97.6 F (36.4 C) Resp: 14 BP: 110/64 SpO2: 98 %  EBL: None  Complications: No immediate post-treatment complications observed by team, or reported by patient.  Note: The patient tolerated the entire procedure well. A repeat set of vitals were taken after the procedure and the patient was kept under observation following institutional policy, for this type of procedure. Post-procedural neurological assessment was performed, showing return to baseline, prior to discharge. The patient was provided with post-procedure discharge instructions, including a section on how to identify potential problems. Should any problems arise concerning this procedure, the patient was given instructions to immediately contact us, at any time, without hesitation. In any case, we plan to contact the patient by telephone for a follow-up status report regarding this interventional procedure.  Comments:  No additional relevant information.  Plan of Care    Imaging Orders     DG C-Arm 1-60 Min-No Report  Procedure Orders     LUMBAR FACET(MEDIAL BRANCH NERVE BLOCK) MBNB  Medications  ordered for procedure: Meds ordered this encounter  Medications  . lidocaine (XYLOCAINE) 2 % (with pres) injection 400 mg  . midazolam (VERSED) 5 MG/5ML injection 1-2 mg  Make sure Flumazenil is available in the pyxis when using this medication. If oversedation occurs, administer 0.2 mg IV over 15 sec. If after 45 sec no response, administer 0.2 mg again over 1 min; may repeat at 1 min intervals; not to exceed 4 doses (1 mg)  . fentaNYL (SUBLIMAZE) injection 25-50 mcg    Make sure Narcan is available in the pyxis when using this medication. In the event of respiratory depression (RR< 8/min): Titrate NARCAN (naloxone) in increments of 0.1 to 0.2 mg IV at 2-3 minute intervals, until desired degree of reversal.  . lactated ringers infusion 1,000 mL  . ropivacaine (PF) 2 mg/mL (0.2%) (NAROPIN) injection 18 mL  . triamcinolone acetonide (KENALOG-40) injection 80 mg   Medications administered: We administered lidocaine, midazolam, fentaNYL, lactated ringers, ropivacaine (PF) 2 mg/mL (0.2%), and triamcinolone acetonide.  See the medical record for exact dosing, route, and time of administration.  Disposition: Discharge home  Discharge Date & Time: 12/08/2017; 1201 hrs.   Physician-requested Follow-up: Return for post-procedure eval (2 wks), w/ Dr. Dossie Arbour.  Future Appointments  Date Time Provider [place]  12/26/2017 11:15 AM Milinda Pointer, MD [facility] None  01/31/2018  2:00 PM [facility] 1 [facility] None  01/31/2018  3:00 PM Dew, Erskine Squibb, MD [facility] None   Primary Care Physician: Martin Majestic, FNP Location: [facility] Note by: Gaspar Cola, MD Date: 12/08/2017; Time: 12:32 PM  Disclaimer:  Medicine is not an exact science. The only guarantee in medicine is that nothing is guaranteed. It is important to note that the decision to proceed with this intervention was based on the information collected from the patient. The Data and  conclusions were drawn from the patient's questionnaire, the interview, and the physical examination. Because the information was provided in large part by the patient, it cannot be guaranteed that it has not been purposely or unconsciously manipulated. Every effort has been made to obtain as much relevant data as possible for this evaluation. It is important to note that the conclusions that lead to this procedure are derived in large part from the available data. Always take into account that the treatment will also be dependent on availability of resources and existing treatment guidelines, considered by other Pain Management Practitioners as being common knowledge and practice, at the time of the intervention. For Medico-Legal purposes, it is also important to point out that variation in procedural techniques and pharmacological choices are the acceptable norm. The indications, contraindications, technique, and results of the above procedure should only be interpreted and judged by a Board-Certified Interventional Pain Specialist with extensive familiarity and expertise in the same exact procedure and technique.

## 2017-12-08 NOTE — Progress Notes (Signed)
Safety precautions to be maintained throughout the outpatient stay will include: orient to surroundings, keep bed in low position, maintain call bell within reach at all times, provide assistance with transfer out of bed and ambulation.  

## 2017-12-08 NOTE — Patient Instructions (Signed)

## 2017-12-09 ENCOUNTER — Telehealth: Payer: Self-pay

## 2017-12-09 NOTE — Telephone Encounter (Signed)
Post procedure phone call.  Patinet states he is doing good 

## 2017-12-14 ENCOUNTER — Telehealth: Payer: Self-pay | Admitting: Pain Medicine

## 2017-12-14 NOTE — Telephone Encounter (Signed)
Had bilateral lumbar facet block on 12-08-17. When awoke this a.m., had worsened pain in the lower back. Pain has been better after applying heating pad and taking Tylenol. Patient advised to continue with heating pad, document this in the post procedure diary, and discuss at follow-up. Advised to go to ED if acute problem occurs.

## 2017-12-14 NOTE — Telephone Encounter (Signed)
Had procedure last week and was feeling some better only dull pain. Got up this morning and is having severe middle lower back pain. Has to go to Dialysis today and can hardly move to walk and go. Please call asap

## 2017-12-16 ENCOUNTER — Encounter: Payer: Self-pay | Admitting: Emergency Medicine

## 2017-12-16 ENCOUNTER — Other Ambulatory Visit: Payer: Self-pay

## 2017-12-16 ENCOUNTER — Emergency Department
Admission: EM | Admit: 2017-12-16 | Discharge: 2017-12-16 | Disposition: A | Discharge status: Home / Self Care | Payer: Medicare Other | Attending: Emergency Medicine | Admitting: Emergency Medicine

## 2017-12-16 DIAGNOSIS — Z79899 Other long term (current) drug therapy: Secondary | ICD-10-CM | POA: Diagnosis not present

## 2017-12-16 DIAGNOSIS — G8929 Other chronic pain: Secondary | ICD-10-CM

## 2017-12-16 DIAGNOSIS — F17210 Nicotine dependence, cigarettes, uncomplicated: Secondary | ICD-10-CM | POA: Insufficient documentation

## 2017-12-16 DIAGNOSIS — E119 Type 2 diabetes mellitus without complications: Secondary | ICD-10-CM | POA: Insufficient documentation

## 2017-12-16 DIAGNOSIS — M545 Low back pain, unspecified: Secondary | ICD-10-CM

## 2017-12-16 DIAGNOSIS — I509 Heart failure, unspecified: Secondary | ICD-10-CM | POA: Insufficient documentation

## 2017-12-16 DIAGNOSIS — J449 Chronic obstructive pulmonary disease, unspecified: Secondary | ICD-10-CM | POA: Diagnosis not present

## 2017-12-16 DIAGNOSIS — Z7982 Long term (current) use of aspirin: Secondary | ICD-10-CM | POA: Insufficient documentation

## 2017-12-16 DIAGNOSIS — I110 Hypertensive heart disease with heart failure: Secondary | ICD-10-CM | POA: Diagnosis not present

## 2017-12-16 MED ORDER — GABAPENTIN 600 MG PO TABS: 600.0000 mg | ORAL_TABLET | Freq: Every day | ORAL | 0 refills | Status: DC

## 2017-12-16 MED ORDER — CYCLOBENZAPRINE HCL 10 MG PO TABS
10.0000 mg | ORAL_TABLET | Freq: Once | ORAL | Status: AC
  Administered 2017-12-16: 10 mg via ORAL
  Filled 2017-12-16: qty 1

## 2017-12-16 MED ORDER — GABAPENTIN 600 MG PO TABS
600.0000 mg | ORAL_TABLET | Freq: Once | ORAL | Status: AC
  Administered 2017-12-16: 600 mg via ORAL
  Filled 2017-12-16: qty 1

## 2017-12-16 NOTE — ED Triage Notes (Signed)
Pt arrived via POV with reports of low back pain, pt has hx of chronic back pain and is dx with spondylosis, lumbar facet syndrome.  Pt goes to Dr. Dossie Arbour at pain management and had a nerve block for back pain on 10/17.    Pt took tylenol and used heating pad with some relief, but states he is not on any other pain medications at this time.  Pt states the nerve block he had has not been as effective for the lower part of his back.   Pt denies any numbness, tingling or loss of bowel or bladder at this time.

## 2017-12-16 NOTE — ED Provider Notes (Signed)
[facility] Emergency Department Provider Note ____________________________________________  Time seen: 1649  I have reviewed the triage vital signs and the nursing notes.  HISTORY  Chief Complaint  Back Pain  HPI Brian [name] is a 57 y.o. male presents to the ED accompanied by his wife, for evaluation of some right-sided low back pain.  Patient with a history of chronic back pain, is currently under the care of Dr. Dossie Arbour, and has in the last week had his first facet joint injection.  According to the patient he had approximately 8 injections across his lumbar spine.  He reported 2 to 3 days of initial benefit, before pain began to increase to the right lower back.  He denies any injury, accident, trauma, or fall.  He also is denying any distal paresthesias, footdrop, saddle anesthesia, or bladder or bowel incontinence.  Patient is without any fevers, chills, sweats, or nausea.  He called to Dr. Adalberto Cole office, who buys him to take Tylenol and apply warm compresses which she did for the last 2 to 3 days but he presents now with pain to the right low back which is not consistent with his typical pain.  And is not responding to his home medications.  In addition to Tylenol patient takes gabapentin and cyclobenzaprine.  He takes no current controlled substances for his chronic pain.  He notes that he will follow-up with pain management, and if symptoms do not respond well to this facet injection, the next option for him is nerve ablation.  Past Medical History:  Diagnosis Date  . CHF (congestive heart failure) ([place])   . COPD (chronic obstructive pulmonary disease) ([place])   . Diabetes mellitus type 2 in obese ([place])   . DM (diabetes mellitus) type II controlled with renal manifestation ([place])   . Hypercholesteremia   . Hypertension   . Morbid obesity with BMI of 45.0-49.9, adult ([place])   . Myocardial infarction ([place])    2018 after infection of dialysis catheter  .  Neuropathy   . Pancreatitis, acute   . Pneumonia   . Sleep apnea    CPAP  . Spinal stenosis     Patient Active Problem List   Diagnosis Date Noted  . Osteoarthritis of hip (Bilateral) 11/16/2017  . Opioid use 11/16/2017  . Elevated sed rate 11/15/2017  . CKD (chronic kidney disease) stage 5, GFR less than 15 ml/min (HCC) 11/15/2017  . Elevated hemoglobin A1c 11/15/2017  . Lumbar Dextroscoliosis 10/31/2017  . Lumbar facet hypertrophy 10/31/2017  . Osteoarthritis of facet joint of lumbar spine 10/31/2017  . Lumbar facet syndrome (Bilateral) 10/31/2017  . Spondylosis without myelopathy or radiculopathy, lumbosacral region 10/31/2017  . DDD (degenerative disc disease), lumbosacral 10/31/2017  . Osteoarthritis of knees (Bilateral) 10/31/2017  . Tricompartment osteoarthritis of knee (Left) 10/31/2017  . Chronic low back pain (Primary Area of Pain) (Bilateral) (R>L) 10/31/2017  . Osteoarthritis of  AC (acromioclavicular) joint (Left) 10/31/2017  . Class 3 obesity with alveolar hypoventilation without serious comorbidity with body mass index (BMI) of 40.0 to 44.9 in adult ([place]) 10/31/2017  . Chronic pain syndrome 10/31/2017  . Abnormal MRI, lumbar spine (06/26/2014) 10/31/2017  . 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 Lumbar central spinal stenosis (L2-3, L3-4, and L4-5) w/o neurogenic claudication 10/31/2017  . Lumbar foraminal stenosis (Multilevel) (Right: L3-4) 10/31/2017  . Other intervertebral disc degeneration, lumbar region 10/31/2017  . Neurogenic pain 10/31/2017  . Chronic musculoskeletal pain 10/31/2017  . Vitamin D insufficiency 10/31/2017  .  Pharmacologic therapy 10/06/2017  . Disorder of skeletal system 10/06/2017  . Problems influencing health status 10/06/2017  . Chronic low back pain (Bilateral) (L>R) w/ sciatica (Bilateral) 10/06/2017  . Chronic hip pain [facility] Area of Pain) (Left) 10/06/2017  . Chronic lower extremity pain (Secondary Area of Pain)  (Bilateral) (R>L) 10/06/2017  . Chronic knee pain (Fourth Area of Pain) (Bilateral) (L>R) 10/06/2017  . ESRD on dialysis ([place]) 02/09/2017  . Fluid overload 02/09/2017  . NSTEMI (non-ST elevated myocardial infarction) ([place]) 01/23/2017  . Acute on chronic diastolic CHF (congestive heart failure) ([place]) 12/29/2016  . Lower extremity lymphedema (Bilateral) 10/14/2016  . Obstructive sleep apnea on CPAP 09/13/2016  . Chronic diastolic heart failure ([place]) 03/28/2016  . Tobacco use 03/28/2016  . Hyperkalemia 03/28/2016  . COPD (chronic obstructive pulmonary disease) ([place]) 02/08/2016  . DM (diabetes mellitus) type II controlled with renal manifestation ([place]) 01/05/2015  . Neuropathy ([place])   . Depression 01/17/2013  . HLD (hyperlipidemia) 01/17/2013  . HTN (hypertension) 01/17/2013  . Severe obesity (BMI >= 40) ([place]) 01/17/2013  . Amputated toe ([place]) 01/17/2013  . Diabetes mellitus ([place]) 01/17/2013    Past Surgical History:  Procedure Laterality Date  . AMPUTATION    . AV FISTULA PLACEMENT Left 04/21/2017   Procedure: ARTERIOVENOUS (AV) FISTULA CREATION ( BRACHIOCEPHALIC );  Surgeon: Algernon Huxley, MD;  Location: [facility] ORS;  Service: Vascular;  Laterality: Left;  . CHOLECYSTECTOMY  1998  . DIALYSIS/PERMA CATHETER INSERTION N/A 01/03/2017   Procedure: DIALYSIS/PERMA CATHETER INSERTION;  Surgeon: Algernon Huxley, MD;  Location: [facility];  Service: Cardiovascular;  Laterality: N/A;  . DIALYSIS/PERMA CATHETER INSERTION N/A 01/31/2017   Procedure: DIALYSIS/PERMA CATHETER INSERTION;  Surgeon: Algernon Huxley, MD;  Location: [facility];  Service: Cardiovascular;  Laterality: N/A;  . DIALYSIS/PERMA CATHETER REMOVAL N/A 01/24/2017   Procedure: DIALYSIS/PERMA CATHETER REMOVAL;  Surgeon: Algernon Huxley, MD;  Location: [facility];  Service: Cardiovascular;  Laterality: N/A;  . DIALYSIS/PERMA CATHETER REMOVAL N/A 08/02/2017   Procedure: DIALYSIS/PERMA CATHETER REMOVAL;  Surgeon:  Katha Cabal, MD;  Location: [facility];  Service: Cardiovascular;  Laterality: N/A;  . HERNIA REPAIR     2017 when appendix removed  . LAPAROSCOPIC APPENDECTOMY N/A 01/06/2015   Procedure: APPENDECTOMY LAPAROSCOPIC drainage of peritoneal abcess;  Surgeon: Sherri Rad, MD;  Location: [facility] ORS;  Service: General;  Laterality: N/A;  . TEE WITHOUT CARDIOVERSION N/A 01/28/2017   Procedure: TRANSESOPHAGEAL ECHOCARDIOGRAM (TEE);  Surgeon: Corey Skains, MD;  Location: [facility] OR[facility];  Service: Cardiovascular;  Laterality: N/A;    Prior to Admission medications   Medication Sig Start Date End Date Taking? Authorizing Provider  albuterol (PROVENTIL HFA;VENTOLIN HFA) 108 (90 Base) MCG/ACT inhaler Inhale 2 puffs into the lungs every 4 (four) hours as needed for wheezing or shortness of breath.     [provider]  aspirin EC 81 MG EC tablet Take 1 tablet (81 mg total) by mouth daily. 02/29/16   [name], MD  atorvastatin (LIPITOR) 80 MG tablet Take 80 mg daily by mouth.    [provider]  carvedilol (COREG) 3.125 MG tablet Take 1 tablet (3.125 mg total) by mouth 2 (two) times daily with a meal. 02/11/17   Bettey Costa, MD  fluticasone (FLOVENT HFA) 220 MCG/ACT inhaler Inhale 2 puffs into the lungs 2 (two) times daily.    [provider]  furosemide (LASIX) 40 MG tablet Take 40 mg by mouth 2 (two) times daily.  Take 40 mg by mouth daily on non dialysis days Tuesday, Thursday, Saturday and Sunday    [provider]  gabapentin (NEURONTIN) 300 MG capsule Take 600 mg by mouth 3 (three) times daily.    [provider]  gabapentin (NEURONTIN) 600 MG tablet Take 1 tablet (600 mg total) by mouth at bedtime for 15 days. 12/16/17 12/31/17  Fredna Stricker, Dannielle Karvonen, PA-C  ibuprofen (ADVIL,MOTRIN) 200 MG tablet Take 400 mg by mouth daily as needed for headache or moderate pain.     [provider]  levothyroxine (SYNTHROID, LEVOTHROID) 50 MCG tablet Take  50 mcg daily before breakfast by mouth.    [provider]  lidocaine-prilocaine (EMLA) cream Apply 1 application topically as needed (for port access).  05/17/17   [provider]  losartan (COZAAR) 25 MG tablet Take 25 mg by mouth daily.    [provider]  sertraline (ZOLOFT) 100 MG tablet Take 100 mg by mouth daily.    [provider]  sevelamer carbonate (RENVELA) 800 MG tablet Take 800 mg by mouth 3 (three) times daily with meals.    [provider]  vitamin C (ASCORBIC ACID) 500 MG tablet Take 500 mg by mouth daily.    [provider]  Vitamin D, Ergocalciferol, (DRISDOL) 50000 units CAPS capsule Take 50,000 Units by mouth every Friday.     [provider]    Allergies Naproxen  Family History  Problem Relation Age of Onset  . Hypertension Mother   . Hyperlipidemia Mother   . Heart disease Father   . Heart disease Maternal Grandfather     Social History Social History   Tobacco Use  . Smoking status: Current Every Day Smoker    Packs/day: 0.25    Types: Cigarettes  . Smokeless tobacco: Never Used  Substance Use Topics  . Alcohol use: No  . Drug use: No    Review of Systems  Constitutional: Negative for fever. Cardiovascular: Negative for chest pain. Respiratory: Negative for shortness of breath. Gastrointestinal: Negative for abdominal pain, vomiting and diarrhea. Genitourinary: Negative for dysuria. Musculoskeletal: Positive for back pain. Skin: Negative for rash. Neurological: Negative for headaches, focal weakness or numbness. ____________________________________________  PHYSICAL EXAM:  VITAL SIGNS: ED Triage Vitals  Enc Vitals Group     BP 12/16/17 1609 121/65     Pulse Rate 12/16/17 1609 68     Resp 12/16/17 1609 18     Temp 12/16/17 1609 98.5 F (36.9 C)     Temp Source 12/16/17 1609 Oral     SpO2 12/16/17 1609 98 %     Weight 12/16/17 1609 285 lb (129.3 kg)     Height 12/16/17 1609  5\' 10"  (1.778 m)     Head Circumference --      Peak Flow --      Pain Score 12/16/17 1616 8     Pain Loc --      Pain Edu? --      Excl. in [place]? --     Constitutional: Alert and oriented. Well appearing and in no distress. Head: Normocephalic and atraumatic. Eyes: Conjunctivae are normal. Normal extraocular movements Cardiovascular: Normal rate, regular rhythm. Normal distal pulses. Respiratory: Normal respiratory effort. No wheezes/rales/rhonchi. Gastrointestinal: Soft and nontender. No distention. Musculoskeletal: Spinal alignment without midline tenderness, spasm, deformity, or step-off.  Patient without tenderness to palpation to his 8 symmetric injection sites still visible on his back.  He does localize pain, however, to the right quadratus lumborum  region in the right flank.  No SI joint tenderness or coccyx pain is appreciated.  Nontender with normal range of motion in all extremities.  Neurologic: Nerves II through XII grossly intact.  Normal LE DTRs bilaterally.  Normal gait without ataxia. Normal speech and language. No gross focal neurologic deficits are appreciated. Skin:  Skin is warm, dry and intact. No rash noted.  Signs of local skin infection at the injection sites Psychiatric: Mood and affect are normal. Patient exhibits appropriate insight and judgment. ____________________________________________  PROCEDURES  Procedures Gabapentin 600 mg PO Flexeril 10 mg PO ____________________________________________  INITIAL IMPRESSION / ASSESSMENT AND PLAN / ED COURSE  She with ED evaluation of chronic low back pain, that has flared following a recent facet joint injection.  Patient denies any referral pain distally and denies any pain above baseline.  We discussed the options for management of his current symptoms including additional medications.  Patient has opted to increase his TID gabapentin to 600 mg at bedtime in the next week.  He is also advised to take his as needed  cyclobenzaprine on schedule for the next week.  He will see pain clinic provider in 1 week for follow-up.  Patient is agreeable with the pain plan as he is not inclined to start narcotic medications that are not prescribed by his pain provider.  I reviewed the patient's prescription history over the last 12 months in the multi-state controlled substances database(s) that includes [place], [place], [place], [place], [place], [place], [place], [place], [place], [place], [place], [place], [place], and [place].  Results were notable for no current/recent prescriptions.  ____________________________________________  FINAL CLINICAL IMPRESSION(S) / ED DIAGNOSES  Final diagnoses:  Chronic right-sided low back pain without sciatica      Carmie End, Dannielle Karvonen, PA-C 12/16/17 2249    Orbie Pyo, MD 12/17/17 [phone]

## 2017-12-16 NOTE — Discharge Instructions (Signed)
Your exam is essentially normal following your procedure. You may need to adjust your home meds as discussed. Follow-up with Dr. Lowella Dandy as scheduled. Return to the ED as needed.

## 2017-12-16 NOTE — ED Notes (Signed)
See triage note  States he is having lower back pain  States back pain is chronic  States he had shot to back last week at pain clinic   Did better for couple of days  Now pain has increased

## 2017-12-22 NOTE — Progress Notes (Signed)
Patient's Name: Brian [name]  MRN: [id number]  Referring Provider: Martin Majestic, *  DOB: November 11, 1960  PCP: Martin Majestic, FNP  DOS: 12/26/2017  Note by: Gaspar Cola, MD  Service setting: Ambulatory outpatient  Specialty: Interventional Pain Management  Location: [facility] (AMB) Pain Management Facility    Patient type: Established   Primary Reason(s) for Visit: Encounter for post-procedure evaluation of chronic illness with mild to moderate exacerbation CC: Back Pain (low and right) and Groin Pain (right)  HPI  Brian [name] is a 57 y.o. year old, male patient, who comes today for a post-procedure evaluation. He has Neuropathy ([place]); Depression; HLD (hyperlipidemia); HTN (hypertension); Severe obesity (BMI >= 40) ([place]); Amputated toe ([place]); Diabetes mellitus ([place]); DM (diabetes mellitus) type II controlled with renal manifestation ([place]); COPD (chronic obstructive pulmonary disease) ([place]); Chronic diastolic heart failure ([place]); Tobacco use; Hyperkalemia; Obstructive sleep apnea on CPAP; Lower extremity lymphedema (Bilateral); Acute on chronic diastolic CHF (congestive heart failure) ([place]); NSTEMI (non-ST elevated myocardial infarction) ([place]); ESRD on dialysis ([place]); Fluid overload; Pharmacologic therapy; Disorder of skeletal system; Problems influencing health status; Chronic low back pain (Bilateral) (L>R) w/ sciatica (Bilateral); Chronic hip pain [facility] Area of Pain) (Left); Chronic lower extremity pain (Secondary Area of Pain) (Bilateral) (R>L); Chronic knee pain (Fourth Area of Pain) (Bilateral) (L>R); Lumbar Dextroscoliosis; Lumbar facet hypertrophy; Osteoarthritis of facet joint of lumbar spine; Lumbar facet syndrome (Bilateral); Spondylosis without myelopathy or radiculopathy, lumbosacral region; DDD (degenerative disc disease), lumbosacral; Osteoarthritis of knees (Bilateral); Tricompartment osteoarthritis of knee (Left); Chronic low back pain (Primary Area of Pain) (Bilateral)  (R>L); Osteoarthritis of  AC (acromioclavicular) joint (Left); Class 3 obesity with alveolar hypoventilation without serious comorbidity with body mass index (BMI) of 40.0 to 44.9 in adult [facility]); Chronic pain syndrome; Abnormal MRI, lumbar spine (06/26/2014); 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 His primarily concern today is the Back Pain (low and right) and Groin Pain (right)  Pain Assessment: Location: Lower, Right Back Radiating: right groin Onset: More than a month ago Duration: Chronic pain Quality: Nagging, Constant, Sharp, Radiating Severity: 3 /10 (subjective, self-reported pain score)  Note: Reported level is compatible with observation.                         When using our objective Pain Scale, levels between 6 and 10/10 are said to belong in an emergency room, as it progressively worsens from a 6/10, described as severely limiting, requiring emergency care not usually available at an outpatient pain management facility. At a 6/10 level, communication becomes difficult and requires great effort. Assistance to reach the emergency department may be required. Facial flushing and profuse sweating along with potentially dangerous increases in heart rate and blood pressure will be evident. Effect on ADL:   Timing: Constant Modifying factors: lying flat, figure 4 stretch BP: 105/68  HR: 82  Brian [name] comes in today for post-procedure evaluation.  Further details on both, my assessment(s), as well as the proposed treatment plan, please see  below.  Post-Procedure Assessment  12/14/2017 Procedure: Diagnostic bilateral lumbar facet block #1 under fluoroscopic guidance and IV sedation Pre-procedure pain  score:  3/10 Post-procedure pain score: 1/10 (> 50% relief) Influential Factors: BMI: 40.89 kg/m Intra-procedural challenges: None observed.         Assessment challenges: None detected.              Reported side-effects: None.        Post-procedural adverse reactions or complications: None reported         Sedation: Sedation provided. When no sedatives are used, the analgesic levels obtained are directly associated to the effectiveness of the local anesthetics. However, when sedation is provided, the level of analgesia obtained during the initial 1 hour following the intervention, is believed to be the result of a combination of factors. These factors may include, but are not limited to: 1. The effectiveness of the local anesthetics used. 2. The effects of the analgesic(s) and/or anxiolytic(s) used. 3. The degree of discomfort experienced by the patient at the time of the procedure. 4. The patients ability and reliability in recalling and recording the events. 5. The presence and influence of possible secondary gains and/or psychosocial factors. Reported result: Relief experienced during the 1st hour after the procedure: 100 % (Ultra-Short Term Relief)            Interpretative annotation: Clinically appropriate result. Analgesia during this period is likely to be Local Anesthetic and/or IV Sedative (Analgesic/Anxiolytic) related.          Effects of local anesthetic: The analgesic effects attained during this period are directly associated to the localized infiltration of local anesthetics and therefore cary significant diagnostic value as to the etiological location, or anatomical origin, of the pain. Expected duration of relief is directly dependent on the pharmacodynamics of the local anesthetic used. Long-acting (4-6 hours)  anesthetics used.  Reported result: Relief during the next 4 to 6 hour after the procedure: 75 % (Short-Term Relief)            Interpretative annotation: Clinically appropriate result. Analgesia during this period is likely to be Local Anesthetic-related.          Long-term benefit: Defined as the period of time past the expected duration of local anesthetics (1 hour for short-acting and 4-6 hours for long-acting). With the possible exception of prolonged sympathetic blockade from the local anesthetics, benefits during this period are typically attributed to, or associated with, other factors such as analgesic sensory neuropraxia, antiinflammatory effects, or beneficial biochemical changes provided by agents other than the local anesthetics.  Reported result: Extended relief following procedure: 0 %(pain came back the next day) (Long-Term Relief)            Interpretative annotation: Clinically possible results. No long-term benefit. Therapeutic failure. Limited inflammation. Possible mechanical aggravating factors.          Current benefits: Defined as reported results that persistent at this point in time.   Analgesia: 0 %            Function: Back to baseline ROM: Back to baseline Interpretative annotation: Recurrence of symptoms. Limited therapeutic benefit. Results would suggest persistent aggravating factors.          Interpretation: Results would suggest a successful diagnostic intervention.                  Plan:  Proceed with diagnostic procedure No.: 2          Laboratory Chemistry  Inflammation Markers (CRP: Acute Phase) (ESR: Chronic Phase) Lab Results  Component Value Date   CRP 10 10/06/2017   ESRSEDRATE  87 (H) 10/06/2017   LATICACIDVEN 1.5 01/23/2017                         Rheumatology Markers No results found.  Renal Markers Lab Results  Component Value Date   BUN 57 (H) 10/06/2017   CREATININE 4.89 (H) 10/06/2017   BCR 12 10/06/2017   GFRAA 14 (L) 10/06/2017    GFRNONAA 12 (L) 10/06/2017                             Hepatic Markers Lab Results  Component Value Date   AST 15 10/06/2017   ALT 30 01/23/2017   ALBUMIN 3.9 10/06/2017                        Neuropathy Markers Lab Results  Component Value Date   VITAMINB12 408 10/06/2017   HGBA1C 6.8 (H) 01/02/2017   HIV Non Reactive 12/30/2016                        Hematology Parameters Lab Results  Component Value Date   INR 1.01 04/14/2017   LABPROT 13.2 04/14/2017   APTT 34 04/14/2017   PLT 264 04/14/2017   HGB 13.3 04/21/2017   HCT 39.0 04/21/2017                        CV Markers Lab Results  Component Value Date   BNP 1,783.0 (H) 02/08/2017   TROPONINI 0.04 ([place]) 02/08/2017                         Note: Lab results reviewed.  Recent Imaging Results   Results for orders placed in visit on 12/08/17  DG C-Arm 1-60 Min-No Report   Narrative Fluoroscopy was utilized by the requesting physician.  No radiographic  interpretation.    Interpretation Report: Fluoroscopy was used during the procedure to assist with needle guidance. The images were interpreted intraoperatively by the requesting physician.  Meds   Current Outpatient Medications:  .  albuterol (PROVENTIL HFA;VENTOLIN HFA) 108 (90 Base) MCG/ACT inhaler, Inhale 2 puffs into the lungs every 4 (four) hours as needed for wheezing or shortness of breath. , Disp: , Rfl:  .  aspirin EC 81 MG EC tablet, Take 1 tablet (81 mg total) by mouth daily., Disp: 30 tablet, Rfl: 0 .  atorvastatin (LIPITOR) 80 MG tablet, Take 80 mg daily by mouth., Disp: , Rfl:  .  carvedilol (COREG) 3.125 MG tablet, Take 1 tablet (3.125 mg total) by mouth 2 (two) times daily with a meal., Disp: 60 tablet, Rfl: 0 .  DULoxetine (CYMBALTA) 60 MG capsule, Take 60 mg by mouth daily., Disp: , Rfl:  .  fluticasone (FLOVENT HFA) 220 MCG/ACT inhaler, Inhale 2 puffs into the lungs 2 (two) times daily., Disp: , Rfl:  .  furosemide (LASIX) 40 MG tablet, Take 40  mg by mouth 2 (two) times daily. Take 40 mg by mouth daily on non dialysis days Tuesday, Thursday, Saturday and Sunday, Disp: , Rfl:  .  ibuprofen (ADVIL,MOTRIN) 200 MG tablet, Take 400 mg by mouth daily as needed for headache or moderate pain. , Disp: , Rfl:  .  levothyroxine (SYNTHROID, LEVOTHROID) 50 MCG tablet, Take 50 mcg daily before breakfast by mouth., Disp: , Rfl:  .  lidocaine-prilocaine (EMLA) cream,  Apply 1 application topically as needed (for port access). , Disp: , Rfl: 11 .  losartan (COZAAR) 25 MG tablet, Take 25 mg by mouth daily., Disp: , Rfl:  .  sertraline (ZOLOFT) 100 MG tablet, Take 100 mg by mouth daily., Disp: , Rfl:  .  sevelamer carbonate (RENVELA) 800 MG tablet, Take 800 mg by mouth 3 (three) times daily with meals., Disp: , Rfl:   ROS  Constitutional: Denies any fever or chills Gastrointestinal: No reported hemesis, hematochezia, vomiting, or acute GI distress Musculoskeletal: Denies any acute onset joint swelling, redness, loss of ROM, or weakness Neurological: No reported episodes of acute onset apraxia, aphasia, dysarthria, agnosia, amnesia, paralysis, loss of coordination, or loss of consciousness  Allergies  Brian [name] is allergic to naproxen.  PFSH  Drug: Brian [name]  reports that he does not use drugs. Alcohol:  reports that he does not drink alcohol. Tobacco:  reports that he has been smoking cigarettes. He has been smoking about 0.25 packs per day. He has never used smokeless tobacco. Medical:  has a past medical history of CHF (congestive heart failure) ([place]), COPD (chronic obstructive pulmonary disease) ([place]), Diabetes mellitus type 2 in obese ([place]), DM (diabetes mellitus) type II controlled with renal manifestation ([place]), Hypercholesteremia, Hypertension, Morbid obesity with BMI of 45.0-49.9, adult ([place]), Myocardial infarction ([place]), Neuropathy, Pancreatitis, acute, Pneumonia, Sleep apnea, and Spinal stenosis. Surgical: Brian [name]  has a past surgical  history that includes Amputation; Cholecystectomy (1998); laparoscopic appendectomy (N/A, 01/06/2015); DIALYSIS/PERMA CATHETER INSERTION (N/A, 01/03/2017); DIALYSIS/PERMA CATHETER REMOVAL (N/A, 01/24/2017); TEE without cardioversion (N/A, 01/28/2017); DIALYSIS/PERMA CATHETER INSERTION (N/A, 01/31/2017); Hernia repair; AV fistula placement (Left, 04/21/2017); and DIALYSIS/PERMA CATHETER REMOVAL (N/A, 08/02/2017). Family: family history includes Heart disease in his father and maternal grandfather; Hyperlipidemia in his mother; Hypertension in his mother.  Constitutional Exam  General appearance: Well nourished, well developed, and well hydrated. In no apparent acute distress Vitals:   12/26/17 1126  BP: 105/68  Pulse: 82  Resp: 18  Temp: 98.2 F (36.8 C)  TempSrc: Oral  SpO2: 95%  Weight: 285 lb (129.3 kg)  Height: _0  (1.778 m)   BMI Assessment: Estimated body mass index is 40.89 kg/m as calculated from the following:   Height as of this encounter: _1  (1.778 m).   Weight as of this encounter: 285 lb (129.3 kg).  BMI interpretation table: BMI level Category Range association with higher incidence of chronic pain  <18 kg/m2 Underweight   18.5-24.9 kg/m2 Ideal body weight   25-29.9 kg/m2 Overweight Increased incidence by 20%  30-34.9 kg/m2 Obese (Class I) Increased incidence by 68%  35-39.9 kg/m2 Severe obesity (Class II) Increased incidence by 136%  >40 kg/m2 Extreme obesity (Class III) Increased incidence by 254%   Patient's current BMI Ideal Body weight  Body mass index is 40.89 kg/m. Ideal body weight: 73 kg (160 lb 15 oz) Adjusted ideal body weight: 95.5 kg (210 lb 9 oz)   BMI Readings from Last 4 Encounters:  12/26/17 40.89 kg/m  12/16/17 40.89 kg/m  12/08/17 40.89 kg/m  11/16/17 40.89 kg/m   Wt Readings from Last 4 Encounters:  12/26/17 285 lb (129.3 kg)  12/16/17 285 lb (129.3 kg)  12/08/17 285 lb (129.3 kg)  11/16/17 285 lb (129.3 kg)  Psych/Mental status:  Alert, oriented x 3 (person, place, & time)       Eyes: PERLA Respiratory: No evidence of acute respiratory distress  Cervical Spine Area Exam  Skin & Axial Inspection: No masses, redness,  edema, swelling, or associated skin lesions Alignment: Symmetrical Functional ROM: Unrestricted ROM      Stability: No instability detected Muscle Tone/Strength: Functionally intact. No obvious neuro-muscular anomalies detected. Sensory (Neurological): Unimpaired Palpation: No palpable anomalies              Upper Extremity (UE) Exam    Side: Right upper extremity  Side: Left upper extremity  Skin & Extremity Inspection: Skin color, temperature, and hair growth are WNL. No peripheral edema or cyanosis. No masses, redness, swelling, asymmetry, or associated skin lesions. No contractures.  Skin & Extremity Inspection: Skin color, temperature, and hair growth are WNL. No peripheral edema or cyanosis. No masses, redness, swelling, asymmetry, or associated skin lesions. No contractures.  Functional ROM: Unrestricted ROM          Functional ROM: Unrestricted ROM          Muscle Tone/Strength: Functionally intact. No obvious neuro-muscular anomalies detected.  Muscle Tone/Strength: Functionally intact. No obvious neuro-muscular anomalies detected.  Sensory (Neurological): Unimpaired          Sensory (Neurological): Unimpaired          Palpation: No palpable anomalies              Palpation: No palpable anomalies              Provocative Test(s):  Phalen's test: deferred Tinel's test: deferred Apley's scratch test (touch opposite shoulder):  Action 1 (Across chest): deferred Action 2 (Overhead): deferred Action 3 (LB reach): deferred   Provocative Test(s):  Phalen's test: deferred Tinel's test: deferred Apley's scratch test (touch opposite shoulder):  Action 1 (Across chest): deferred Action 2 (Overhead): deferred Action 3 (LB reach): deferred    Thoracic Spine Area Exam  Skin & Axial Inspection: No  masses, redness, or swelling Alignment: Symmetrical Functional ROM: Unrestricted ROM Stability: No instability detected Muscle Tone/Strength: Functionally intact. No obvious neuro-muscular anomalies detected. Sensory (Neurological): Unimpaired Muscle strength & Tone: No palpable anomalies  Lumbar Spine Area Exam  Skin & Axial Inspection: No masses, redness, or swelling Alignment: Symmetrical Functional ROM: Decreased ROM affecting both sides Stability: No instability detected Muscle Tone/Strength: Functionally intact. No obvious neuro-muscular anomalies detected. Sensory (Neurological): Movement-associated pain Palpation: Complains of area being tender to palpation       Provocative Tests: Hyperextension/rotation test: (+) bilaterally for facet joint pain. Lumbar quadrant test (Kemp's test): deferred today       Lateral bending test: deferred today       Patrick's Maneuver: deferred today                   FABER test: deferred today                   S-I anterior distraction/compression test: deferred today         S-I lateral compression test: deferred today         S-I Thigh-thrust test: deferred today         S-I Gaenslen's test: deferred today          Gait & Posture Assessment  Ambulation: Patient came in today in a wheel chair Gait: Significantly limited. Dependent on assistive device to ambulate Posture: Difficulty standing up straight, due to pain   Lower Extremity Exam    Side: Right lower extremity  Side: Left lower extremity  Stability: No instability observed          Stability: No instability observed  Skin & Extremity Inspection: Skin color, temperature, and hair growth are WNL. No peripheral edema or cyanosis. No masses, redness, swelling, asymmetry, or associated skin lesions. No contractures.  Skin & Extremity Inspection: Skin color, temperature, and hair growth are WNL. No peripheral edema or cyanosis. No masses, redness, swelling, asymmetry, or associated  skin lesions. No contractures.  Functional ROM: Unrestricted ROM                  Functional ROM: Unrestricted ROM                  Muscle Tone/Strength: Functionally intact. No obvious neuro-muscular anomalies detected.  Muscle Tone/Strength: Functionally intact. No obvious neuro-muscular anomalies detected.  Sensory (Neurological): Unimpaired  Sensory (Neurological): Unimpaired  Palpation: No palpable anomalies  Palpation: No palpable anomalies   Assessment  Primary Diagnosis & Pertinent Problem List: The primary encounter diagnosis was Chronic low back pain (Primary Area of Pain) (Bilateral) (R>L). Diagnoses of Spondylosis without myelopathy or radiculopathy, lumbosacral region, Lumbar facet hypertrophy, Lumbar facet syndrome (Bilateral), Osteoarthritis of facet joint of lumbar spine, and Abnormal MRI, lumbar spine (06/26/2014) were also pertinent to this visit.  Status Diagnosis  Persistent Persistent Stable 1. Chronic low back pain (Primary Area of Pain) (Bilateral) (R>L)   2. Spondylosis without myelopathy or radiculopathy, lumbosacral region   3. Lumbar facet hypertrophy   4. Lumbar facet syndrome (Bilateral)   5. Osteoarthritis of facet joint of lumbar spine   6. Abnormal MRI, lumbar spine (06/26/2014)     Problems updated and reviewed during this visit: No problems updated. Plan of Care  Pharmacotherapy (Medications Ordered): No orders of the defined types were placed in this encounter.  Medications administered today: Clifton James had no medications administered during this visit.   Procedure Orders     LUMBAR FACET(MEDIAL BRANCH NERVE BLOCK) [facility] Lab Orders  No laboratory test(s) ordered today   Imaging Orders  No imaging studies ordered today   Referral Orders  No referral(s) requested today   Interventional management options: Planned, scheduled, and/or pending:   1. Poor candidate for pharmacotherapy secondary to end-stage renal disease on dialysis, sleep  apnea, tobacco abuse, and COPD.  In addition, technical difficulties monitoring use of opioid analgesics secondary to anuria. 2. High risk candidate for interventional therapies using steroids due to DM.   3. Correct vitamin D insufficiency.  4. Bring BMI down to less than 30.   Diagnostic bilateral lumbar facet block #2 under fluoro and IV sedation.    Considering:   Diagnostic bilateral lumbar facet block  Possible bilateral lumbar facet RFA  Diagnostic T11-12 thoracic ESI  Diagnostic bilateral L2-3 transforaminal ESI  Diagnostic bilateral L3-4 transforaminal ESI  Diagnostic bilateral L4-5 transforaminal ESI  Diagnostic left-sided intra-articular hip joint injection  Diagnostic left-sided femoral nerve + obturator nerve block  Possible left-sided femoral nerve + obturator nerve RFA  Diagnostic bilateral intra-articular knee injection with local anesthetic and steroid  Possible series of 5 bilateral intra-articular Hyalgan knee injections  Diagnostic bilateral genicular nerve block  Possible bilateral genicular nerve RFA    Palliative PRN treatment(s):   None at this time   Provider-requested follow-up: Return for Procedure (w/ sedation): (B) L-FCT BLK #2.  Future Appointments  Date Time Provider [place]  01/05/2018  8:15 AM Milinda Pointer, MD [facility] None  01/31/2018  2:00 PM [facility] 1 [facility] None  01/31/2018  3:00 PM Dew, Erskine Squibb, MD [facility] None   Primary Care Physician: Martin Majestic, FNP  Location: [facility] Note by: Gaspar Cola, MD Date: 12/26/2017; Time: 12:42 PM

## 2017-12-26 ENCOUNTER — Other Ambulatory Visit: Payer: Self-pay

## 2017-12-26 ENCOUNTER — Encounter: Payer: Self-pay | Admitting: Pain Medicine

## 2017-12-26 ENCOUNTER — Ambulatory Visit: Payer: Medicare Other | Attending: Pain Medicine | Admitting: Pain Medicine

## 2017-12-26 VITALS — BP 105/68 | HR 82 | Temp 98.2°F | Resp 18 | Ht 70.0 in | Wt 285.0 lb

## 2017-12-26 DIAGNOSIS — M545 Low back pain: Secondary | ICD-10-CM | POA: Diagnosis not present

## 2017-12-26 DIAGNOSIS — I132 Hypertensive heart and chronic kidney disease with heart failure and with stage 5 chronic kidney disease, or end stage renal disease: Secondary | ICD-10-CM | POA: Diagnosis not present

## 2017-12-26 DIAGNOSIS — F17210 Nicotine dependence, cigarettes, uncomplicated: Secondary | ICD-10-CM | POA: Diagnosis not present

## 2017-12-26 DIAGNOSIS — E662 Morbid (severe) obesity with alveolar hypoventilation: Secondary | ICD-10-CM | POA: Insufficient documentation

## 2017-12-26 DIAGNOSIS — Z79899 Other long term (current) drug therapy: Secondary | ICD-10-CM | POA: Diagnosis not present

## 2017-12-26 DIAGNOSIS — R937 Abnormal findings on diagnostic imaging of other parts of musculoskeletal system: Secondary | ICD-10-CM | POA: Diagnosis not present

## 2017-12-26 DIAGNOSIS — F1190 Opioid use, unspecified, uncomplicated: Secondary | ICD-10-CM | POA: Insufficient documentation

## 2017-12-26 DIAGNOSIS — Z6841 Body Mass Index (BMI) 40.0 and over, adult: Secondary | ICD-10-CM | POA: Insufficient documentation

## 2017-12-26 DIAGNOSIS — N186 End stage renal disease: Secondary | ICD-10-CM | POA: Insufficient documentation

## 2017-12-26 DIAGNOSIS — M47816 Spondylosis without myelopathy or radiculopathy, lumbar region: Secondary | ICD-10-CM | POA: Diagnosis not present

## 2017-12-26 DIAGNOSIS — J440 Chronic obstructive pulmonary disease with acute lower respiratory infection: Secondary | ICD-10-CM | POA: Insufficient documentation

## 2017-12-26 DIAGNOSIS — Z9889 Other specified postprocedural states: Secondary | ICD-10-CM | POA: Diagnosis not present

## 2017-12-26 DIAGNOSIS — Z9049 Acquired absence of other specified parts of digestive tract: Secondary | ICD-10-CM | POA: Insufficient documentation

## 2017-12-26 DIAGNOSIS — G894 Chronic pain syndrome: Secondary | ICD-10-CM | POA: Insufficient documentation

## 2017-12-26 DIAGNOSIS — I5033 Acute on chronic diastolic (congestive) heart failure: Secondary | ICD-10-CM | POA: Insufficient documentation

## 2017-12-26 DIAGNOSIS — M5137 Other intervertebral disc degeneration, lumbosacral region: Secondary | ICD-10-CM | POA: Insufficient documentation

## 2017-12-26 DIAGNOSIS — Z7982 Long term (current) use of aspirin: Secondary | ICD-10-CM | POA: Diagnosis not present

## 2017-12-26 DIAGNOSIS — R9389 Abnormal findings on diagnostic imaging of other specified body structures: Secondary | ICD-10-CM | POA: Insufficient documentation

## 2017-12-26 DIAGNOSIS — Z992 Dependence on renal dialysis: Secondary | ICD-10-CM | POA: Insufficient documentation

## 2017-12-26 DIAGNOSIS — E1122 Type 2 diabetes mellitus with diabetic chronic kidney disease: Secondary | ICD-10-CM | POA: Diagnosis not present

## 2017-12-26 DIAGNOSIS — M170 Bilateral primary osteoarthritis of knee: Secondary | ICD-10-CM | POA: Diagnosis not present

## 2017-12-26 DIAGNOSIS — M19012 Primary osteoarthritis, left shoulder: Secondary | ICD-10-CM | POA: Diagnosis not present

## 2017-12-26 DIAGNOSIS — Z79890 Hormone replacement therapy (postmenopausal): Secondary | ICD-10-CM | POA: Insufficient documentation

## 2017-12-26 DIAGNOSIS — Z8249 Family history of ischemic heart disease and other diseases of the circulatory system: Secondary | ICD-10-CM | POA: Insufficient documentation

## 2017-12-26 DIAGNOSIS — M160 Bilateral primary osteoarthritis of hip: Secondary | ICD-10-CM | POA: Insufficient documentation

## 2017-12-26 DIAGNOSIS — M47817 Spondylosis without myelopathy or radiculopathy, lumbosacral region: Secondary | ICD-10-CM

## 2017-12-26 DIAGNOSIS — Z886 Allergy status to analgesic agent status: Secondary | ICD-10-CM | POA: Insufficient documentation

## 2017-12-26 DIAGNOSIS — Z8349 Family history of other endocrine, nutritional and metabolic diseases: Secondary | ICD-10-CM | POA: Insufficient documentation

## 2017-12-26 DIAGNOSIS — G8929 Other chronic pain: Secondary | ICD-10-CM | POA: Diagnosis not present

## 2017-12-26 NOTE — Patient Instructions (Addendum)
____________________________________________________________________________________________  Preparing for Procedure with Sedation  Instructions: . Oral Intake: Do not eat or drink anything for at least 8 hours prior to your procedure. . Transportation: Public transportation is not allowed. Bring an adult driver. The driver must be physically present in our waiting room before any procedure can be started. . Physical Assistance: Bring an adult physically capable of assisting you, in the event you need help. This adult should keep you company at home for at least 6 hours after the procedure. . Blood Pressure Medicine: Take your blood pressure medicine with a sip of water the morning of the procedure. . Blood thinners: Notify our staff if you are taking any blood thinners. Depending on which one you take, there will be specific instructions on how and when to stop it. . Diabetics on insulin: Notify the staff so that you can be scheduled 1st case in the morning. If your diabetes requires high dose insulin, take only  of your normal insulin dose the morning of the procedure and notify the staff that you have done so. . Preventing infections: Shower with an antibacterial soap the morning of your procedure. . Build-up your immune system: Take 1000 mg of Vitamin C with every meal (3 times a day) the day prior to your procedure. . Antibiotics: Inform the staff if you have a condition or reason that requires you to take antibiotics before dental procedures. . Pregnancy: If you are pregnant, call and cancel the procedure. . Sickness: If you have a cold, fever, or any active infections, call and cancel the procedure. . Arrival: You must be in the facility at least 30 minutes prior to your scheduled procedure. . Children: Do not bring children with you. . Dress appropriately: Bring dark clothing that you would not mind if they get stained. . Valuables: Do not bring any jewelry or valuables.  Procedure  appointments are reserved for interventional treatments only. . No Prescription Refills. . No medication changes will be discussed during procedure appointments. . No disability issues will be discussed.  Reasons to call and reschedule or cancel your procedure: (Following these recommendations will minimize the risk of a serious complication.) . Surgeries: Avoid having procedures within 2 weeks of any surgery. (Avoid for 2 weeks before or after any surgery). . Flu Shots: Avoid having procedures within 2 weeks of a flu shots or . (Avoid for 2 weeks before or after immunizations). . Barium: Avoid having a procedure within 7-10 days after having had a radiological study involving the use of radiological contrast. (Myelograms, Barium swallow or enema study). . Heart attacks: Avoid any elective procedures or surgeries for the initial 6 months after a "Myocardial Infarction" (Heart Attack). . Blood thinners: It is imperative that you stop these medications before procedures. Let us know if you if you take any blood thinner.  . Infection: Avoid procedures during or within two weeks of an infection (including chest colds or gastrointestinal problems). Symptoms associated with infections include: Localized redness, fever, chills, night sweats or profuse sweating, burning sensation when voiding, cough, congestion, stuffiness, runny nose, sore throat, diarrhea, nausea, vomiting, cold or Flu symptoms, recent or current infections. It is specially important if the infection is over the area that we intend to treat. . Heart and lung problems: Symptoms that may suggest an active cardiopulmonary problem include: cough, chest pain, breathing difficulties or shortness of breath, dizziness, ankle swelling, uncontrolled high or unusually low blood pressure, and/or palpitations. If you are experiencing any of these symptoms, cancel   your procedure and contact your primary care physician for an evaluation.  Remember:   Regular Business hours are:  Monday to Thursday 8:00 AM to 4:00 PM  Provider's Schedule: Milinda Pointer, MD:  Procedure days: Tuesday and Thursday 7:30 AM to 4:00 PM  Gillis Santa, MD:  Procedure days: Monday and Wednesday 7:30 AM to 4:00 PM ____________________________________________________________________________________________   Facet Blocks Patient Information  Description: The facets are joints in the spine between the vertebrae.  Like any joints in the body, facets can become irritated and painful.  Arthritis can also effect the facets.  By injecting steroids and local anesthetic in and around these joints, we can temporarily block the nerve supply to them.  Steroids act directly on irritated nerves and tissues to reduce selling and inflammation which often leads to decreased pain.  Facet blocks may be done anywhere along the spine from the neck to the low back depending upon the location of your pain.   After numbing the skin with local anesthetic (like Novocaine), a small needle is passed onto the facet joints under x-ray guidance.  You may experience a sensation of pressure while this is being done.  The entire block usually lasts about 15-25 minutes.   Conditions which may be treated by facet blocks:   Low back/buttock pain  Neck/shoulder pain  Certain types of headaches  Preparation for the injection:  1. Do not eat any solid food or dairy products within 8 hours of your appointment. 2. You may drink clear liquid up to 3 hours before appointment.  Clear liquids include water, black coffee, juice or soda.  No milk or cream please. 3. You may take your regular medication, including pain medications, with a sip of water before your appointment.  Diabetics should hold regular insulin (if taken separately) and take 1/2 normal NPH dose the morning of the procedure.  Carry some sugar containing items with you to your appointment. 4. A driver must accompany you and be  prepared to drive you home after your procedure. 5. Bring all your current medications with you. 6. An IV may be inserted and sedation may be given at the discretion of the physician. 7. A blood pressure cuff, EKG and other monitors will often be applied during the procedure.  Some patients may need to have extra oxygen administered for a short period. 8. You will be asked to provide medical information, including your allergies and medications, prior to the procedure.  We must know immediately if you are taking blood thinners (like Coumadin/Warfarin) or if you are allergic to IV iodine contrast (dye).  We must know if you could possible be pregnant.  Possible side-effects:   Bleeding from needle site  Infection (rare, may require surgery)  Nerve injury (rare)  Numbness & tingling (temporary)  Difficulty urinating (rare, temporary)  Spinal headache (a headache worse with upright posture)  Light-headedness (temporary)  Pain at injection site (serveral days)  Decreased blood pressure (rare, temporary)  Weakness in arm/leg (temporary)  Pressure sensation in back/neck (temporary)   Call if you experience:   Fever/chills associated with headache or increased back/neck pain  Headache worsened by an upright position  New onset, weakness or numbness of an extremity below the injection site  Hives or difficulty breathing (go to the emergency room)  Inflammation or drainage at the injection site(s)  Severe back/neck pain greater than usual  New symptoms which are concerning to you  Please note:  Although the local anesthetic injected can often make  your back or neck feel good for several hours after the injection, the pain will likely return. It takes 3-7 days for steroids to work.  You may not notice any pain relief for at least one week.  If effective, we will often do a series of 2-3 injections spaced 3-6 weeks apart to maximally decrease your pain.  After the initial  series, you may be a candidate for a more permanent nerve block of the facets.  If you have any questions, please call #[phone]) [facility] Facet Joint Block The facet joints connect the bones of the spine (vertebrae). They make it possible for you to bend, twist, and make other movements with your spine. They also keep you from bending too far, twisting too far, and making other excessive movements. A facet joint block is a procedure where a numbing medicine (anesthetic) is injected into a facet joint. Often, a type of anti-inflammatory medicine called a steroid is also injected. A facet joint block may be done to diagnose neck or back pain. If the pain gets better after a facet joint block, it means the pain is probably coming from the facet joint. If the pain does not get better, it means the pain is probably not coming from the facet joint. A facet joint block may also be done to relieve neck or back pain caused by an inflamed facet joint. A facet joint block is only done to relieve pain if the pain does not improve with other methods, such as medicine, exercise programs, and physical therapy. Tell a health care provider about:  Any allergies you have.  All medicines you are taking, including vitamins, herbs, eye drops, creams, and over-the-counter medicines.  Any problems you or family members have had with anesthetic medicines.  Any blood disorders you have.  Any surgeries you have had.  Any medical conditions you have.  Whether you are pregnant or may be pregnant. What are the risks? Generally, this is a safe procedure. However, problems may occur, including:  Bleeding.  Injury to a nerve near the injection site.  Pain at the injection site.  Weakness or numbness in areas controlled by nerves near the injection site.  Infection.  Temporary fluid retention.  Allergic reactions to medicines or dyes.  Injury to other structures or  organs near the injection site.  What happens before the procedure?  Follow instructions from your health care provider about eating or drinking restrictions.  Ask your health care provider about: ? Changing or stopping your regular medicines. This is especially important if you are taking diabetes medicines or blood thinners. ? Taking medicines such as aspirin and ibuprofen. These medicines can thin your blood. Do not take these medicines before your procedure if your health care provider instructs you not to.  Do not take any new dietary supplements or medicines without asking your health care provider first.  Plan to have someone take you home after the procedure. What happens during the procedure?  You may need to remove your clothing and dress in an open-back gown.  The procedure will be done while you are lying on an X-ray table. You will most likely be asked to lie on your stomach, but you may be asked to lie in a different position if an injection will be made in your neck.  Machines will be used to monitor your oxygen levels, heart rate, and blood pressure.  If an injection will be made in your neck, an  IV tube will be inserted into one of your veins. Fluids and medicine will flow directly into your body through the IV tube.  The area over the facet joint where the injection will be made will be cleaned with soap. The surrounding skin will be covered with clean drapes.  A numbing medicine (local anesthetic) will be applied to your skin. Your skin may sting or burn for a moment.  A video X-ray machine (fluoroscopy) will be used to locate the joint. In some cases, a CT scan may be used.  A contrast dye may be injected into the facet joint area to help locate the joint.  When the joint is located, an anesthetic will be injected into the joint through the needle.  Your health care provider will ask you whether you feel pain relief. If you do feel relief, a steroid may be  injected to provide pain relief for a longer period of time. If you do not feel relief or feel only partial relief, additional injections of an anesthetic may be made in other facet joints.  The needle will be removed.  Your skin will be cleaned.  A bandage (dressing) will be applied over each injection site. The procedure may vary among health care providers and hospitals. What happens after the procedure?  You will be observed for 15-30 minutes before being allowed to go home. This information is not intended to replace advice given to you by your health care provider. Make sure you discuss any questions you have with your health care provider. Document Released: 06/30/2006 Document Revised: 03/12/2015 Document Reviewed: 11/04/2014 [employer] Interactive Patient Education  [employer].

## 2018-01-05 ENCOUNTER — Ambulatory Visit
Admission: RE | Admit: 2018-01-05 | Discharge: 2018-01-05 | Disposition: A | Discharge status: Home / Self Care | Payer: Medicare Other | Source: Ambulatory Visit | Attending: Pain Medicine | Admitting: Pain Medicine

## 2018-01-05 ENCOUNTER — Other Ambulatory Visit: Payer: Self-pay

## 2018-01-05 ENCOUNTER — Ambulatory Visit (HOSPITAL_BASED_OUTPATIENT_CLINIC_OR_DEPARTMENT_OTHER): Payer: Medicare Other | Admitting: Pain Medicine

## 2018-01-05 ENCOUNTER — Encounter: Payer: Self-pay | Admitting: Pain Medicine

## 2018-01-05 VITALS — BP 146/70 | HR 89 | Temp 96.8°F | Resp 16 | Ht 69.0 in | Wt 285.0 lb

## 2018-01-05 DIAGNOSIS — M47817 Spondylosis without myelopathy or radiculopathy, lumbosacral region: Secondary | ICD-10-CM | POA: Diagnosis not present

## 2018-01-05 DIAGNOSIS — M5137 Other intervertebral disc degeneration, lumbosacral region: Secondary | ICD-10-CM | POA: Diagnosis not present

## 2018-01-05 DIAGNOSIS — G8929 Other chronic pain: Secondary | ICD-10-CM

## 2018-01-05 DIAGNOSIS — M47816 Spondylosis without myelopathy or radiculopathy, lumbar region: Secondary | ICD-10-CM

## 2018-01-05 DIAGNOSIS — M545 Low back pain: Secondary | ICD-10-CM

## 2018-01-05 MED ORDER — LIDOCAINE HCL 2 % IJ SOLN
20.0000 mL | Freq: Once | INTRAMUSCULAR | Status: AC
  Administered 2018-01-05: 400 mg
  Filled 2018-01-05: qty 20

## 2018-01-05 MED ORDER — ROPIVACAINE HCL 2 MG/ML IJ SOLN
18.0000 mL | Freq: Once | INTRAMUSCULAR | Status: AC
  Administered 2018-01-05: 10 mL via PERINEURAL
  Filled 2018-01-05: qty 20

## 2018-01-05 MED ORDER — TRIAMCINOLONE ACETONIDE 40 MG/ML IJ SUSP
80.0000 mg | Freq: Once | INTRAMUSCULAR | Status: AC
  Administered 2018-01-05: 40 mg
  Filled 2018-01-05: qty 2

## 2018-01-05 MED ORDER — LACTATED RINGERS IV SOLN
1000.0000 mL | Freq: Once | INTRAVENOUS | Status: AC
  Administered 2018-01-05: 1000 mL via INTRAVENOUS

## 2018-01-05 MED ORDER — MIDAZOLAM HCL 5 MG/5ML IJ SOLN
1.0000 mg | INTRAMUSCULAR | Status: DC | PRN
  Administered 2018-01-05: 2 mg via INTRAVENOUS
  Filled 2018-01-05: qty 5

## 2018-01-05 MED ORDER — FENTANYL CITRATE (PF) 100 MCG/2ML IJ SOLN
25.0000 ug | INTRAMUSCULAR | Status: DC | PRN
  Administered 2018-01-05: 50 ug via INTRAVENOUS
  Filled 2018-01-05: qty 2

## 2018-01-05 NOTE — Progress Notes (Signed)
Patient's Name: Brian [name]  MRN: [id number]  Referring Provider: Martin Majestic, *  DOB: 1961/01/02  PCP: Martin Majestic, FNP  DOS: 01/05/2018  Note by: Gaspar Cola, MD  Service setting: Ambulatory outpatient  Specialty: Interventional Pain Management  Patient type: Established  Location: [facility] (AMB) Pain Management Facility  Visit type: Interventional Procedure   Primary Reason for Visit: Interventional Pain Management Treatment. CC: Back Pain  Procedure:          Anesthesia, Analgesia, Anxiolysis:  Type: Lumbar Facet, Medial Branch Block(s) #2  Primary Purpose: Diagnostic Region: Posterolateral Lumbosacral Spine Level: L2, L3, L4, L5, & S1 Medial Branch Level(s). Injecting these levels blocks the L3-4, L4-5, and L5-S1 lumbar facet joints. Laterality: Bilateral  Type: Moderate (Conscious) Sedation combined with Local Anesthesia Indication(s): Analgesia and Anxiety Route: Intravenous (IV) IV Access: Secured Sedation: Meaningful verbal contact was maintained at all times during the procedure  Local Anesthetic: Lidocaine 1-2%  Position: Prone   Indications: 1. Spondylosis without myelopathy or radiculopathy, lumbosacral region   2. Lumbar facet syndrome (Bilateral)   3. Lumbar facet hypertrophy   4. Osteoarthritis of facet joint of lumbar spine   5. DDD (degenerative disc disease), lumbosacral   6. Chronic low back pain (Primary Area of Pain) (Bilateral) (R>L)    Pain Score: Pre-procedure: 3 /10 Post-procedure: 0-No pain/10  Pre-op Assessment:  Brian [name] is a 57 y.o. (year old), male patient, seen today for interventional treatment. He  has a past surgical history that includes Amputation; Cholecystectomy (1998); laparoscopic appendectomy (N/A, 01/06/2015); DIALYSIS/PERMA CATHETER INSERTION (N/A, 01/03/2017); DIALYSIS/PERMA CATHETER REMOVAL (N/A, 01/24/2017); TEE without cardioversion (N/A, 01/28/2017); DIALYSIS/PERMA CATHETER INSERTION (N/A, 01/31/2017);  Hernia repair; AV fistula placement (Left, 04/21/2017); and DIALYSIS/PERMA CATHETER REMOVAL (N/A, 08/02/2017). Brian [name] has a current medication list which includes the following prescription(s): albuterol, aspirin, atorvastatin, carvedilol, duloxetine, fluticasone, furosemide, ibuprofen, levothyroxine, lidocaine-prilocaine, losartan, sertraline, and sevelamer carbonate, and the following Facility-Administered Medications: fentanyl and midazolam. His primarily concern today is the Back Pain  Initial Vital Signs:  Pulse/HCG Rate: 89ECG Heart Rate: 86 Temp: 98.5 F (36.9 C) Resp: 18 BP: 123/73 SpO2: 100 %  BMI: Estimated body mass index is 42.09 kg/m as calculated from the following:   Height as of this encounter: 5\' 9"  (1.753 m).   Weight as of this encounter: 285 lb (129.3 kg).  Risk Assessment: Allergies: Reviewed. He is allergic to naproxen.  Allergy Precautions: None required Coagulopathies: Reviewed. None identified.  Blood-thinner therapy: None at this time Active Infection(s): Reviewed. None identified. Brian [name] is afebrile  Site Confirmation: Brian [name] was asked to confirm the procedure and laterality before marking the site Procedure checklist: Completed Consent: Before the procedure and under the influence of no sedative(s), amnesic(s), or anxiolytics, the patient was informed of the treatment options, risks and possible complications. To fulfill our ethical and legal obligations, as recommended by the American Medical Association's Code of Ethics, I have informed the patient of my clinical impression; the nature and purpose of the treatment or procedure; the risks, benefits, and possible complications of the intervention; the alternatives, including doing nothing; the risk(s) and benefit(s) of the alternative treatment(s) or procedure(s); and the risk(s) and benefit(s) of doing nothing. The patient was provided information about the general risks and possible complications  associated with the procedure. These may include, but are not limited to: failure to achieve desired goals, infection, bleeding, organ or nerve damage, allergic reactions, paralysis, and death. In addition, the patient was informed of those  risks and complications associated to Spine-related procedures, such as failure to decrease pain; infection (i.e.: Meningitis, epidural or intraspinal abscess); bleeding (i.e.: epidural hematoma, subarachnoid hemorrhage, or any other type of intraspinal or peri-dural bleeding); organ or nerve damage (i.e.: Any type of peripheral nerve, nerve root, or spinal cord injury) with subsequent damage to sensory, motor, and/or autonomic systems, resulting in permanent pain, numbness, and/or weakness of one or several areas of the body; allergic reactions; (i.e.: anaphylactic reaction); and/or death. Furthermore, the patient was informed of those risks and complications associated with the medications. These include, but are not limited to: allergic reactions (i.e.: anaphylactic or anaphylactoid reaction(s)); adrenal axis suppression; blood sugar elevation that in diabetics may result in ketoacidosis or comma; water retention that in patients with history of congestive heart failure may result in shortness of breath, pulmonary edema, and decompensation with resultant heart failure; weight gain; swelling or edema; medication-induced neural toxicity; particulate matter embolism and blood vessel occlusion with resultant organ, and/or nervous system infarction; and/or aseptic necrosis of one or more joints. Finally, the patient was informed that Medicine is not an exact science; therefore, there is also the possibility of unforeseen or unpredictable risks and/or possible complications that may result in a catastrophic outcome. The patient indicated having understood very clearly. We have given the patient no guarantees and we have made no promises. Enough time was given to the patient to  ask questions, all of which were answered to the patient's satisfaction. Brian [name] has indicated that he wanted to continue with the procedure. Attestation: I, the ordering provider, attest that I have discussed with the patient the benefits, risks, side-effects, alternatives, likelihood of achieving goals, and potential problems during recovery for the procedure that I have provided informed consent. Date  Time: 01/05/2018  8:19 AM  Pre-Procedure Preparation:  Monitoring: As per clinic protocol. Respiration, ETCO2, SpO2, BP, heart rate and rhythm monitor placed and checked for adequate function Safety Precautions: Patient was assessed for positional comfort and pressure points before starting the procedure. Time-out: I initiated and conducted the "Time-out" before starting the procedure, as per protocol. The patient was asked to participate by confirming the accuracy of the "Time Out" information. Verification of the correct person, site, and procedure were performed and confirmed by me, the nursing staff, and the patient. "Time-out" conducted as per Joint Commission's Universal Protocol (UP.01.01.01). Time: 5284  Description of Procedure:          Laterality: Bilateral. The procedure was performed in identical fashion on both sides. Levels:  L2, L3, L4, L5, & S1 Medial Branch Level(s) Area Prepped: Posterior Lumbosacral Region Prepping solution: ChloraPrep (2% chlorhexidine gluconate and 70% isopropyl alcohol) Safety Precautions: Aspiration looking for blood return was conducted prior to all injections. At no point did we inject any substances, as a needle was being advanced. Before injecting, the patient was told to immediately notify me if he was experiencing any new onset of "ringing in the ears, or metallic taste in the mouth". No attempts were made at seeking any paresthesias. Safe injection practices and needle disposal techniques used. Medications properly checked for expiration dates. SDV  (single dose vial) medications used. After the completion of the procedure, all disposable equipment used was discarded in the proper designated medical waste containers. Local Anesthesia: Protocol guidelines were followed. The patient was positioned over the fluoroscopy table. The area was prepped in the usual manner. The time-out was completed. The target area was identified using fluoroscopy. A 12-in long, straight, sterile hemostat  was used with fluoroscopic guidance to locate the targets for each level blocked. Once located, the skin was marked with an approved surgical skin marker. Once all sites were marked, the skin (epidermis, dermis, and hypodermis), as well as deeper tissues (fat, connective tissue and muscle) were infiltrated with a small amount of a short-acting local anesthetic, loaded on a 10cc syringe with a 25G, 1.5-in  Needle. An appropriate amount of time was allowed for local anesthetics to take effect before proceeding to the next step. Local Anesthetic: Lidocaine 2.0% The unused portion of the local anesthetic was discarded in the proper designated containers. Technical explanation of process:  L2 Medial Branch Nerve Block (MBB): The target area for the L2 medial branch is at the junction of the postero-lateral aspect of the superior articular process and the superior, posterior, and medial edge of the transverse process of L3. Under fluoroscopic guidance, a Quincke needle was inserted until contact was made with os over the superior postero-lateral aspect of the pedicular shadow (target area). After negative aspiration for blood, 0.5 mL of the nerve block solution was injected without difficulty or complication. The needle was removed intact. L3 Medial Branch Nerve Block (MBB): The target area for the L3 medial branch is at the junction of the postero-lateral aspect of the superior articular process and the superior, posterior, and medial edge of the transverse process of L4. Under  fluoroscopic guidance, a Quincke needle was inserted until contact was made with os over the superior postero-lateral aspect of the pedicular shadow (target area). After negative aspiration for blood, 0.5 mL of the nerve block solution was injected without difficulty or complication. The needle was removed intact. L4 Medial Branch Nerve Block (MBB): The target area for the L4 medial branch is at the junction of the postero-lateral aspect of the superior articular process and the superior, posterior, and medial edge of the transverse process of L5. Under fluoroscopic guidance, a Quincke needle was inserted until contact was made with os over the superior postero-lateral aspect of the pedicular shadow (target area). After negative aspiration for blood, 0.5 mL of the nerve block solution was injected without difficulty or complication. The needle was removed intact. L5 Medial Branch Nerve Block (MBB): The target area for the L5 medial branch is at the junction of the postero-lateral aspect of the superior articular process and the superior, posterior, and medial edge of the sacral ala. Under fluoroscopic guidance, a Quincke needle was inserted until contact was made with os over the superior postero-lateral aspect of the pedicular shadow (target area). After negative aspiration for blood, 0.5 mL of the nerve block solution was injected without difficulty or complication. The needle was removed intact. S1 Medial Branch Nerve Block (MBB): The target area for the S1 medial branch is at the posterior and inferior 6 o'clock position of the L5-S1 facet joint. Under fluoroscopic guidance, the Quincke needle inserted for the L5 MBB was redirected until contact was made with os over the inferior and postero aspect of the sacrum, at the 6 o' clock position under the L5-S1 facet joint (Target area). After negative aspiration for blood, 0.5 mL of the nerve block solution was injected without difficulty or complication. The  needle was removed intact. Procedural Needles: 22-gauge, 3.5-inch, Quincke needles used for all levels. Nerve block solution: 0.2% PF-Ropivacaine + Triamcinolone (40 mg/mL) diluted to a final concentration of 4 mg of Triamcinolone/mL of Ropivacaine The unused portion of the solution was discarded in the proper designated containers.  Once  the entire procedure was completed, the treated area was cleaned, making sure to leave some of the prepping solution back to take advantage of its long term bactericidal properties.   Illustration of the posterior view of the lumbar spine and the posterior neural structures. Laminae of L2 through S1 are labeled. DPRL5, dorsal primary ramus of L5; DPRS1, dorsal primary ramus of S1; DPR3, dorsal primary ramus of L3; FJ, facet (zygapophyseal) joint L3-L4; I, inferior articular process of L4; LB1, lateral branch of dorsal primary ramus of L1; IAB, inferior articular branches from L3 medial branch (supplies L4-L5 facet joint); IBP, intermediate branch plexus; MB3, medial branch of dorsal primary ramus of L3; NR3, third lumbar nerve root; S, superior articular process of L5; SAB, superior articular branches from L4 (supplies L4-5 facet joint also); TP3, transverse process of L3.  Vitals:   01/05/18 1001 01/05/18 1010 01/05/18 1020 01/05/18 1032  BP: (!) 128/98 134/78 (!) 150/67 (!) 146/70  Pulse:      Resp: 18 16 17 16   Temp:  (!) 96.7 F (35.9 C)  (!) 96.8 F (36 C)  TempSrc:      SpO2: 97% 98% 98% 99%  Weight:      Height:         Start Time: 0952 hrs. End Time: 1001 hrs.  Imaging Guidance (Spinal):          Type of Imaging Technique: Fluoroscopy Guidance (Spinal) Indication(s): Assistance in needle guidance and placement for procedures requiring needle placement in or near specific anatomical locations not easily accessible without such assistance. Exposure Time: Please see nurses notes. Contrast: None used. Fluoroscopic Guidance: I was personally  present during the use of fluoroscopy. "Tunnel Vision Technique" used to obtain the best possible view of the target area. Parallax error corrected before commencing the procedure. "Direction-depth-direction" technique used to introduce the needle under continuous pulsed fluoroscopy. Once target was reached, antero-posterior, oblique, and lateral fluoroscopic projection used confirm needle placement in all planes. Images permanently stored in EMR. Interpretation: No contrast injected. I personally interpreted the imaging intraoperatively. Adequate needle placement confirmed in multiple planes. Permanent images saved into the patient's record.  Antibiotic Prophylaxis:   Anti-infectives (From admission, onward)   None     Indication(s): None identified  Post-operative Assessment:  Post-procedure Vital Signs:  Pulse/HCG Rate: 8980 Temp: (!) 96.8 F (36 C) Resp: 16 BP: (!) 146/70 SpO2: 99 %  EBL: None  Complications: No immediate post-treatment complications observed by team, or reported by patient.  Note: The patient tolerated the entire procedure well. A repeat set of vitals were taken after the procedure and the patient was kept under observation following institutional policy, for this type of procedure. Post-procedural neurological assessment was performed, showing return to baseline, prior to discharge. The patient was provided with post-procedure discharge instructions, including a section on how to identify potential problems. Should any problems arise concerning this procedure, the patient was given instructions to immediately contact us, at any time, without hesitation. In any case, we plan to contact the patient by telephone for a follow-up status report regarding this interventional procedure.  Comments:  No additional relevant information.  Plan of Care    Imaging Orders     DG C-Arm 1-60 Min-No Report  Procedure Orders     LUMBAR FACET(MEDIAL BRANCH NERVE BLOCK)  MBNB  Medications ordered for procedure: Meds ordered this encounter  Medications  . lidocaine (XYLOCAINE) 2 % (with pres) injection 400 mg  . midazolam (VERSED) 5 MG/5ML injection  1-2 mg    Make sure Flumazenil is available in the pyxis when using this medication. If oversedation occurs, administer 0.2 mg IV over 15 sec. If after 45 sec no response, administer 0.2 mg again over 1 min; may repeat at 1 min intervals; not to exceed 4 doses (1 mg)  . fentaNYL (SUBLIMAZE) injection 25-50 mcg    Make sure Narcan is available in the pyxis when using this medication. In the event of respiratory depression (RR< 8/min): Titrate NARCAN (naloxone) in increments of 0.1 to 0.2 mg IV at 2-3 minute intervals, until desired degree of reversal.  . lactated ringers infusion 1,000 mL  . ropivacaine (PF) 2 mg/mL (0.2%) (NAROPIN) injection 18 mL  . triamcinolone acetonide (KENALOG-40) injection 80 mg   Medications administered: We administered lidocaine, midazolam, fentaNYL, lactated ringers, ropivacaine (PF) 2 mg/mL (0.2%), and triamcinolone acetonide.  See the medical record for exact dosing, route, and time of administration.  Disposition: Discharge home  Discharge Date & Time: 01/05/2018; 1034 hrs.   Physician-requested Follow-up: Return for post-procedure eval (2 wks), w/ Dr. Dossie Arbour.  Future Appointments  Date Time Provider [place]  01/30/2018  8:15 AM Milinda Pointer, MD [facility] None  01/31/2018  2:00 PM [facility] 1 [facility] None  01/31/2018  3:00 PM Dew, Erskine Squibb, MD [facility] None   Primary Care Physician: Martin Majestic, FNP Location: [facility] Note by: Gaspar Cola, MD Date: 01/05/2018; Time: 11:48 AM  Disclaimer:  Medicine is not an exact science. The only guarantee in medicine is that nothing is guaranteed. It is important to note that the decision to proceed with this intervention was based on the information collected from the  patient. The Data and conclusions were drawn from the patient's questionnaire, the interview, and the physical examination. Because the information was provided in large part by the patient, it cannot be guaranteed that it has not been purposely or unconsciously manipulated. Every effort has been made to obtain as much relevant data as possible for this evaluation. It is important to note that the conclusions that lead to this procedure are derived in large part from the available data. Always take into account that the treatment will also be dependent on availability of resources and existing treatment guidelines, considered by other Pain Management Practitioners as being common knowledge and practice, at the time of the intervention. For Medico-Legal purposes, it is also important to point out that variation in procedural techniques and pharmacological choices are the acceptable norm. The indications, contraindications, technique, and results of the above procedure should only be interpreted and judged by a Board-Certified Interventional Pain Specialist with extensive familiarity and expertise in the same exact procedure and technique.

## 2018-01-05 NOTE — Progress Notes (Signed)
Safety precautions to be maintained throughout the outpatient stay will include: orient to surroundings, keep bed in low position, maintain call bell within reach at all times, provide assistance with transfer out of bed and ambulation.  

## 2018-01-05 NOTE — Patient Instructions (Signed)

## 2018-01-06 ENCOUNTER — Telehealth: Payer: Self-pay

## 2018-01-06 NOTE — Telephone Encounter (Signed)
Post procedure phone call.  Patient states he is doing fine.

## 2018-01-27 NOTE — Progress Notes (Deleted)
Patient's Name: Brian [name]  MRN: [id number]  Referring Provider: Martin [name], *  DOB: 08-29-1960  PCP: Brian Majestic, FNP  DOS: 01/30/2018  Note by: Brian Cola, MD  Service setting: Ambulatory outpatient  Specialty: Interventional Pain Management  Location: [facility] (AMB) Pain Management Facility    Patient type: Established   Primary Reason(s) for Visit: Encounter for post-procedure evaluation of chronic illness with mild to moderate exacerbation CC: No chief complaint on file.  HPI  [name] is a 57 y.o. year old, male patient, who comes today for a post-procedure evaluation. He has Neuropathy (Brian [name]); Depression; HLD (hyperlipidemia); HTN (hypertension); Severe obesity (BMI >= 40) (Brian [name]); Amputated toe (Brian [name]); Diabetes mellitus ([place]); DM (diabetes mellitus) type II controlled with renal manifestation (Brian [name]); COPD (chronic obstructive pulmonary disease) (Brian [name]); Chronic diastolic heart failure (Brian [name]); Tobacco use; Hyperkalemia; Obstructive sleep apnea on CPAP; Lower extremity lymphedema (Bilateral); Acute on chronic diastolic CHF (congestive heart failure) ([place] [name]); NSTEMI (non-ST elevated myocardial infarction) (Brian [name]); ESRD on dialysis (Brian [name]); Fluid overload; Pharmacologic therapy; Disorder of skeletal system; Problems influencing health status; Chronic low back pain (Bilateral) (L>R) w/ sciatica (Bilateral); Chronic hip pain [facility] Area of Pain) (Left); Chronic lower extremity pain (Secondary Area of Pain) (Bilateral) (R>L); Chronic knee pain (Fourth Area of Pain) (Bilateral) (L>R); Lumbar Dextroscoliosis; Lumbar facet hypertrophy; Osteoarthritis of facet joint of lumbar spine; Lumbar facet syndrome (Bilateral); Spondylosis without myelopathy or radiculopathy, lumbosacral region; DDD (degenerative disc disease), lumbosacral; Osteoarthritis of knees (Bilateral); Tricompartment osteoarthritis of knee (Left); Chronic low back pain (Primary Area of Pain) (Bilateral) (R>L); Osteoarthritis  of  AC (acromioclavicular) joint (Left); Class 3 obesity with alveolar hypoventilation without serious comorbidity with body mass index (BMI) of 40.0 to 44.9 in adult Brian [name]); Chronic pain syndrome; Abnormal MRI, lumbar spine (06/26/2014); 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 His primarily concern today is the No chief complaint on file.  Pain Assessment: Location:     Radiating:   Onset:   Duration:   Quality:   Severity:  /10 (subjective, self-reported pain score)  Note: Reported level is compatible with observation.                         When using our objective Pain Scale, levels between 6 and 10/10 are said to belong in an emergency room, as it progressively worsens from a 6/10, described as severely limiting, requiring emergency care not usually available at an outpatient pain management facility. At a 6/10 level, communication becomes difficult and requires great effort. Assistance to reach the emergency department may be required. Facial flushing and profuse sweating along with potentially dangerous increases in heart rate and blood pressure will be evident. Effect on ADL:   Timing:   Modifying factors:   BP:    HR:    [name] comes in today for post-procedure evaluation.  Further details on both, my assessment(s), as well as the proposed treatment plan, please see below.  Post-Procedure Assessment  01/05/2018 Procedure: Diagnostic bilateral lumbar facet block#2under fluoro and IV sedation.  Pre-procedure pain score:   3/10 Post-procedure pain score: 0/10 (100% relief) Influential Factors: BMI:  Intra-procedural challenges: None observed.         Assessment challenges: None detected.              Reported side-effects: None.        Post-procedural adverse reactions or complications: None reported         Sedation: Sedation provided. When no sedatives are used, the analgesic levels obtained are directly associated to the effectiveness of the local anesthetics. However, when sedation is provided, the level of analgesia obtained during the initial 1 hour following the intervention, is believed to be the result of a combination of factors. These factors may include, but are not limited to: 1. The effectiveness of the local anesthetics used. 2. The effects of the analgesic(s) and/or anxiolytic(s) used. 3. The degree of discomfort experienced by the patient at the time of the procedure. 4. The patients ability and reliability in recalling and recording the events. 5. The presence and influence of possible secondary gains and/or psychosocial factors. Reported result: Relief experienced during the 1st hour after the procedure:   (Ultra-Short Term Relief)            Interpretative annotation: Clinically appropriate result. Analgesia during this period is likely to be Local Anesthetic and/or IV Sedative (Analgesic/Anxiolytic) related.          Effects of local anesthetic: The analgesic effects attained during this period are directly associated to the localized infiltration of local anesthetics and therefore cary significant diagnostic value as to the etiological location, or anatomical origin, of the pain. Expected duration of relief is directly dependent on the pharmacodynamics of the local anesthetic used. Long-acting (4-6 hours) anesthetics used.  Reported result: Relief during the next 4 to 6 hour after the procedure:   (Short-Term Relief)            Interpretative annotation: Clinically appropriate result.  Analgesia during this period is likely to be Local Anesthetic-related.          Long-term benefit: Defined as the period of time past the expected duration of local anesthetics (1 hour for short-acting and 4-6 hours for long-acting). With the possible exception of prolonged sympathetic blockade from the local anesthetics, benefits during this period are typically attributed to, or associated with, other factors such as analgesic sensory neuropraxia, antiinflammatory effects, or beneficial biochemical changes provided by agents other than the local anesthetics.  Reported result: Extended relief following procedure:   (Long-Term Relief)            Interpretative annotation: Clinically possible results. Good relief. No permanent benefit expected. Inflammation plays a part in the etiology to the pain.          Current benefits: Defined as reported results that persistent at this point in time.   Analgesia: *** %            Function: Somewhat improved ROM: Somewhat improved Interpretative annotation: Recurrence of symptoms. No permanent benefit expected. Effective diagnostic intervention.          Interpretation: Results would suggest a successful diagnostic intervention.                  Plan:  Please see "Plan of Care" for details.                Laboratory Chemistry  Inflammation Markers (CRP: Acute Phase) (ESR: Chronic Phase) Lab Results  Component Value Date   CRP 10 10/06/2017   ESRSEDRATE 87 (H) 10/06/2017   LATICACIDVEN 1.5 01/23/2017  Rheumatology Markers No results found.  Renal Markers Lab Results  Component Value Date   BUN 57 (H) 10/06/2017   CREATININE 4.89 (H) 10/06/2017   BCR 12 10/06/2017   GFRAA 14 (L) 10/06/2017   GFRNONAA 12 (L) 10/06/2017                             Hepatic Markers Lab Results  Component Value Date   AST 15 10/06/2017   ALT 30 01/23/2017   ALBUMIN 3.9 10/06/2017                        Neuropathy Markers Lab  Results  Component Value Date   VITAMINB12 408 10/06/2017   HGBA1C 6.8 (H) 01/02/2017   HIV Non Reactive 12/30/2016                        Hematology Parameters Lab Results  Component Value Date   INR 1.01 04/14/2017   LABPROT 13.2 04/14/2017   APTT 34 04/14/2017   PLT 264 04/14/2017   HGB 13.3 04/21/2017   HCT 39.0 04/21/2017                        CV Markers Lab Results  Component Value Date   BNP 1,783.0 (H) 02/08/2017   TROPONINI 0.04 ([place]) 02/08/2017                         Note: Lab results reviewed.  Recent Imaging Results   Results for orders placed in visit on 01/05/18  DG C-Arm 1-60 Min-No Report   Narrative Fluoroscopy was utilized by the requesting physician.  No radiographic  interpretation.    Interpretation Report: Fluoroscopy was used during the procedure to assist with needle guidance. The images were interpreted intraoperatively by the requesting physician.  Meds   Current Outpatient Medications:  .  albuterol (PROVENTIL HFA;VENTOLIN HFA) 108 (90 Base) MCG/ACT inhaler, Inhale 2 puffs into the lungs every 4 (four) hours as needed for wheezing or shortness of breath. , Disp: , Rfl:  .  aspirin EC 81 MG EC tablet, Take 1 tablet (81 mg total) by mouth daily., Disp: 30 tablet, Rfl: 0 .  atorvastatin (LIPITOR) 80 MG tablet, Take 80 mg daily by mouth., Disp: , Rfl:  .  carvedilol (COREG) 3.125 MG tablet, Take 1 tablet (3.125 mg total) by mouth 2 (two) times daily with a meal., Disp: 60 tablet, Rfl: 0 .  DULoxetine (CYMBALTA) 60 MG capsule, Take 60 mg by mouth daily., Disp: , Rfl:  .  fluticasone (FLOVENT HFA) 220 MCG/ACT inhaler, Inhale 2 puffs into the lungs 2 (two) times daily., Disp: , Rfl:  .  furosemide (LASIX) 40 MG tablet, Take 40 mg by mouth 2 (two) times daily. Take 40 mg by mouth daily on non dialysis days Tuesday, Thursday, Saturday and Sunday, Disp: , Rfl:  .  ibuprofen (ADVIL,MOTRIN) 200 MG tablet, Take 400 mg by mouth daily as needed for headache  or moderate pain. , Disp: , Rfl:  .  levothyroxine (SYNTHROID, LEVOTHROID) 50 MCG tablet, Take 50 mcg daily before breakfast by mouth., Disp: , Rfl:  .  lidocaine-prilocaine (EMLA) cream, Apply 1 application topically as needed (for port access). , Disp: , Rfl: 11 .  losartan (COZAAR) 25 MG tablet, Take 25 mg by mouth daily., Disp: , Rfl:  .  sertraline (ZOLOFT) 100 MG tablet, Take 100 mg by mouth daily., Disp: , Rfl:  .  sevelamer carbonate (RENVELA) 800 MG tablet, Take 800 mg by mouth 3 (three) times daily with meals., Disp: , Rfl:   ROS  Constitutional: Denies any fever or chills Gastrointestinal: No reported hemesis, hematochezia, vomiting, or acute GI distress Musculoskeletal: Denies any acute onset joint swelling, redness, loss of ROM, or weakness Neurological: No reported episodes of acute onset apraxia, aphasia, dysarthria, agnosia, amnesia, paralysis, loss of coordination, or loss of consciousness  Allergies  Mr. Kwiatek is allergic to naproxen.  PFSH  Drug: Mr. Gurry  reports that he does not use drugs. Alcohol:  reports that he does not drink alcohol. Tobacco:  reports that he has been smoking cigarettes. He has been smoking about 0.25 packs per day. He has never used smokeless tobacco. Medical:  has a past medical history of CHF (congestive heart failure) ([place]), COPD (chronic obstructive pulmonary disease) ([place]), Diabetes mellitus type 2 in obese ([place]), DM (diabetes mellitus) type II controlled with renal manifestation ([place]), Hypercholesteremia, Hypertension, Morbid obesity with BMI of 45.0-49.9, adult ([place]), Myocardial infarction ([place]), Neuropathy, Pancreatitis, acute, Pneumonia, Sleep apnea, and Spinal stenosis. Surgical: Mr. Sanagustin  has a past surgical history that includes Amputation; Cholecystectomy (1998); laparoscopic appendectomy (N/A, 01/06/2015); DIALYSIS/PERMA CATHETER INSERTION (N/A, 01/03/2017); DIALYSIS/PERMA CATHETER REMOVAL (N/A, 01/24/2017); TEE without cardioversion  (N/A, 01/28/2017); DIALYSIS/PERMA CATHETER INSERTION (N/A, 01/31/2017); Hernia repair; AV fistula placement (Left, 04/21/2017); and DIALYSIS/PERMA CATHETER REMOVAL (N/A, 08/02/2017). Family: family history includes Heart disease in his father and maternal grandfather; Hyperlipidemia in his mother; Hypertension in his mother.  Constitutional Exam  General appearance: Well nourished, well developed, and well hydrated. In no apparent acute distress There were no vitals filed for this visit. BMI Assessment: Estimated body mass index is 42.09 kg/m as calculated from the following:   Height as of 01/05/18: _0  (1.753 m).   Weight as of 01/05/18: 285 lb (129.3 kg).  BMI interpretation table: BMI level Category Range association with higher incidence of chronic pain  <18 kg/m2 Underweight   18.5-24.9 kg/m2 Ideal body weight   25-29.9 kg/m2 Overweight Increased incidence by 20%  30-34.9 kg/m2 Obese (Class I) Increased incidence by 68%  35-39.9 kg/m2 Severe obesity (Class II) Increased incidence by 136%  >40 kg/m2 Extreme obesity (Class III) Increased incidence by 254%   Patient's current BMI Ideal Body weight  There is no height or weight on file to calculate BMI. Patient weight not recorded   BMI Readings from Last 4 Encounters:  01/05/18 42.09 kg/m  12/26/17 40.89 kg/m  12/16/17 40.89 kg/m  12/08/17 40.89 kg/m   Wt Readings from Last 4 Encounters:  01/05/18 285 lb (129.3 kg)  12/26/17 285 lb (129.3 kg)  12/16/17 285 lb (129.3 kg)  12/08/17 285 lb (129.3 kg)  Psych/Mental status: Alert, oriented x 3 (person, place, & time)       Eyes: PERLA Respiratory: No evidence of acute respiratory distress  Cervical Spine Area Exam  Skin & Axial Inspection: No masses, redness, edema, swelling, or associated skin lesions Alignment: Symmetrical Functional ROM: Unrestricted ROM      Stability: No instability detected Muscle Tone/Strength: Functionally intact. No obvious neuro-muscular  anomalies detected. Sensory (Neurological): Unimpaired Palpation: No palpable anomalies              Upper Extremity (UE) Exam    Side: Right upper extremity  Side: Left upper extremity  Skin & Extremity Inspection: Skin color, temperature, and hair growth  are WNL. No peripheral edema or cyanosis. No masses, redness, swelling, asymmetry, or associated skin lesions. No contractures.  Skin & Extremity Inspection: Skin color, temperature, and hair growth are WNL. No peripheral edema or cyanosis. No masses, redness, swelling, asymmetry, or associated skin lesions. No contractures.  Functional ROM: Unrestricted ROM          Functional ROM: Unrestricted ROM          Muscle Tone/Strength: Functionally intact. No obvious neuro-muscular anomalies detected.  Muscle Tone/Strength: Functionally intact. No obvious neuro-muscular anomalies detected.  Sensory (Neurological): Unimpaired          Sensory (Neurological): Unimpaired          Palpation: No palpable anomalies              Palpation: No palpable anomalies              Provocative Test(s):  Phalen's test: deferred Tinel's test: deferred Apley's scratch test (touch opposite shoulder):  Action 1 (Across chest): deferred Action 2 (Overhead): deferred Action 3 (LB reach): deferred   Provocative Test(s):  Phalen's test: deferred Tinel's test: deferred Apley's scratch test (touch opposite shoulder):  Action 1 (Across chest): deferred Action 2 (Overhead): deferred Action 3 (LB reach): deferred    Thoracic Spine Area Exam  Skin & Axial Inspection: No masses, redness, or swelling Alignment: Symmetrical Functional ROM: Unrestricted ROM Stability: No instability detected Muscle Tone/Strength: Functionally intact. No obvious neuro-muscular anomalies detected. Sensory (Neurological): Unimpaired Muscle strength & Tone: No palpable anomalies  Lumbar Spine Area Exam  Skin & Axial Inspection: No masses, redness, or swelling Alignment:  Symmetrical Functional ROM: Unrestricted ROM       Stability: No instability detected Muscle Tone/Strength: Functionally intact. No obvious neuro-muscular anomalies detected. Sensory (Neurological): Unimpaired Palpation: No palpable anomalies       Provocative Tests: Hyperextension/rotation test: deferred today       Lumbar quadrant test (Kemp's test): deferred today       Lateral bending test: deferred today       Patrick's Maneuver: deferred today                   FABER* test: deferred today                   S-I anterior distraction/compression test: deferred today         S-I lateral compression test: deferred today         S-I Thigh-thrust test: deferred today         S-I Gaenslen's test: deferred today         *(Flexion, ABduction and External Rotation)  Gait & Posture Assessment  Ambulation: Unassisted Gait: Relatively normal for age and body habitus Posture: WNL   Lower Extremity Exam    Side: Right lower extremity  Side: Left lower extremity  Stability: No instability observed          Stability: No instability observed          Skin & Extremity Inspection: Skin color, temperature, and hair growth are WNL. No peripheral edema or cyanosis. No masses, redness, swelling, asymmetry, or associated skin lesions. No contractures.  Skin & Extremity Inspection: Skin color, temperature, and hair growth are WNL. No peripheral edema or cyanosis. No masses, redness, swelling, asymmetry, or associated skin lesions. No contractures.  Functional ROM: Unrestricted ROM                  Functional ROM: Unrestricted ROM  Muscle Tone/Strength: Functionally intact. No obvious neuro-muscular anomalies detected.  Muscle Tone/Strength: Functionally intact. No obvious neuro-muscular anomalies detected.  Sensory (Neurological): Unimpaired        Sensory (Neurological): Unimpaired        DTR: Patellar: deferred today Achilles: deferred today Plantar: deferred today  DTR: Patellar:  deferred today Achilles: deferred today Plantar: deferred today  Palpation: No palpable anomalies  Palpation: No palpable anomalies   Assessment  Primary Diagnosis & Pertinent Problem List: The primary encounter diagnosis was Chronic low back pain (Primary Area of Pain) (Bilateral) (R>L). Diagnoses of Lumbar facet syndrome (Bilateral), Chronic lower extremity pain (Secondary Area of Pain) (Bilateral) (R>L), Spondylosis without myelopathy or radiculopathy, lumbosacral region, Lumbar facet hypertrophy, Osteoarthritis of facet joint of lumbar spine, CKD (chronic kidney disease) stage 5, GFR less than 15 ml/min (Brian [name]), Class 3 obesity with alveolar hypoventilation without serious comorbidity with body mass index (BMI) of 40.0 to 44.9 in adult [place] [name]), and ESRD on dialysis [facility]) were also pertinent to this visit.  Status Diagnosis  Controlled Controlled Controlled 1. Chronic low back pain (Primary Area of Pain) (Bilateral) (R>L)   2. Lumbar facet syndrome (Bilateral)   3. Chronic lower extremity pain (Secondary Area of Pain) (Bilateral) (R>L)   4. Spondylosis without myelopathy or radiculopathy, lumbosacral region   5. Lumbar facet hypertrophy   6. Osteoarthritis of facet joint of lumbar spine   7. CKD (chronic kidney disease) stage 5, GFR less than 15 ml/min (Brian [name])   8. Class 3 obesity with alveolar hypoventilation without serious comorbidity with body mass index (BMI) of 40.0 to 44.9 in adult ([place])   9. ESRD on dialysis [place] [name])     Problems updated and reviewed during this visit: No problems updated. Plan of Care  Pharmacotherapy (Medications Ordered): No orders of the defined types were placed in this encounter.  Medications administered today: Clifton James had no medications administered during this visit.  Procedure Orders    No procedure(s) ordered today   Lab Orders  No laboratory test(s) ordered today   Imaging Orders  No imaging studies ordered today   Referral Orders  No  referral(s) requested today   Interventional management options: Planned, scheduled, and/or pending:   ***   Considering:   Diagnostic bilateral lumbar facet block Possible bilateral lumbar facet RFA Diagnostic T11-12 thoracic ESI Diagnostic bilateral L2-3 transforaminal ESI Diagnostic bilateral L3-4 transforaminal ESI Diagnostic bilateral L4-5 transforaminal ESI Diagnostic left-sided intra-articular hip joint injection Diagnostic left-sided femoral nerve + obturator nerve block Possible left-sided femoral nerve+obturator nerve RFA Diagnostic bilateral intra-articular knee injection with local anesthetic and steroid Possible series of 5 bilateral intra-articular Hyalgan knee injections Diagnostic bilateral genicular nerve block Possible bilateral genicular nerve RFA   Palliative PRN treatment(s):   Diagnostic bilateral lumbar facet blockunder fluoro and IV sedation.    Provider-requested follow-up: No follow-ups on file.  Future Appointments  Date Time Provider [place]  01/30/2018  8:15 AM Milinda Pointer, MD [facility] None  01/31/2018  2:00 PM [facility] VASC 1 [facility] None  01/31/2018  3:00 PM Dew, Erskine Squibb, MD [facility] None   Primary Care Physician: Brian Majestic, FNP Location: [facility] Out[facility] Management Facility Note by: Brian Cola, MD Date: 01/30/2018; Time: 5:07 PM

## 2018-01-30 ENCOUNTER — Ambulatory Visit: Payer: Medicare Other | Admitting: Pain Medicine

## 2018-01-31 ENCOUNTER — Ambulatory Visit (INDEPENDENT_AMBULATORY_CARE_PROVIDER_SITE_OTHER): Payer: Medicare Other | Admitting: Vascular Surgery

## 2018-01-31 ENCOUNTER — Encounter (INDEPENDENT_AMBULATORY_CARE_PROVIDER_SITE_OTHER): Payer: Medicare Other

## 2018-02-08 ENCOUNTER — Other Ambulatory Visit (INDEPENDENT_AMBULATORY_CARE_PROVIDER_SITE_OTHER): Payer: Self-pay | Admitting: Nurse Practitioner

## 2018-02-08 DIAGNOSIS — L819 Disorder of pigmentation, unspecified: Secondary | ICD-10-CM

## 2018-02-09 ENCOUNTER — Other Ambulatory Visit: Payer: Self-pay

## 2018-02-09 ENCOUNTER — Encounter (INDEPENDENT_AMBULATORY_CARE_PROVIDER_SITE_OTHER): Payer: Self-pay | Admitting: Nurse Practitioner

## 2018-02-09 ENCOUNTER — Ambulatory Visit (INDEPENDENT_AMBULATORY_CARE_PROVIDER_SITE_OTHER): Payer: Medicare Other

## 2018-02-09 ENCOUNTER — Encounter (INDEPENDENT_AMBULATORY_CARE_PROVIDER_SITE_OTHER): Payer: Self-pay

## 2018-02-09 ENCOUNTER — Ambulatory Visit (INDEPENDENT_AMBULATORY_CARE_PROVIDER_SITE_OTHER): Payer: Medicare Other | Admitting: Nurse Practitioner

## 2018-02-09 VITALS — BP 105/64 | HR 85 | Resp 16 | Ht 70.0 in | Wt 272.0 lb

## 2018-02-09 DIAGNOSIS — L97511 Non-pressure chronic ulcer of other part of right foot limited to breakdown of skin: Secondary | ICD-10-CM

## 2018-02-09 DIAGNOSIS — J449 Chronic obstructive pulmonary disease, unspecified: Secondary | ICD-10-CM

## 2018-02-09 DIAGNOSIS — E785 Hyperlipidemia, unspecified: Secondary | ICD-10-CM

## 2018-02-09 DIAGNOSIS — L819 Disorder of pigmentation, unspecified: Secondary | ICD-10-CM

## 2018-02-09 DIAGNOSIS — I70202 Unspecified atherosclerosis of native arteries of extremities, left leg: Secondary | ICD-10-CM

## 2018-02-09 DIAGNOSIS — Z992 Dependence on renal dialysis: Secondary | ICD-10-CM

## 2018-02-09 DIAGNOSIS — I70235 Atherosclerosis of native arteries of right leg with ulceration of other part of foot: Secondary | ICD-10-CM

## 2018-02-09 DIAGNOSIS — I7025 Atherosclerosis of native arteries of other extremities with ulceration: Secondary | ICD-10-CM | POA: Insufficient documentation

## 2018-02-09 DIAGNOSIS — E1122 Type 2 diabetes mellitus with diabetic chronic kidney disease: Secondary | ICD-10-CM

## 2018-02-09 DIAGNOSIS — N186 End stage renal disease: Secondary | ICD-10-CM

## 2018-02-09 DIAGNOSIS — F17210 Nicotine dependence, cigarettes, uncomplicated: Secondary | ICD-10-CM

## 2018-02-09 DIAGNOSIS — Z794 Long term (current) use of insulin: Secondary | ICD-10-CM

## 2018-02-09 NOTE — Progress Notes (Signed)
Subjective:    Patient ID: Brian [name], male    DOB: 1960/11/30, 57 y.o.   MRN: [id number] Chief Complaint  Patient presents with  . Follow-up    discoloration of toe    HPI  Brian [name] is a 57 y.o. male that presents today per request from Dr. Juleen China with concern for discolored toe.  Patient states that he noticed this discolored great toe on Monday morning.  He has severe neuropathy and he did not feel any irritation.  He stated that the pain however became very intense only especially when he was lying flat.  He stated that the numbness and tingling also became worse.  He stated that something similar happened to his left great toe however it was too late to intervene and it was amputated.  The patient is a current end-stage renal disease patient as well as a current smoker.  Patient denies any fever, chills, nausea, vomiting or diarrhea.  Patient denies any chest pain or shortness of breath.  The patient underwent bilateral ABIs today.  They are noncompressible bilaterally.  His bilateral tibial waveforms are monophasic.  The right great toe has nearly flat waveforms.  All other digits on his right foot have flat waveforms.  His left lower extremity has abnormal waveforms with the second and fourth digit nearly flat.  Past Medical History:  Diagnosis Date  . CHF (congestive heart failure) ([place])   . COPD (chronic obstructive pulmonary disease) ([place])   . Diabetes mellitus type 2 in obese ([place])   . DM (diabetes mellitus) type II controlled with renal manifestation ([place])   . Hypercholesteremia   . Hypertension   . Morbid obesity with BMI of 45.0-49.9, adult ([place])   . Myocardial infarction ([place])    2018 after infection of dialysis catheter  . Neuropathy   . Pancreatitis, acute   . Pneumonia   . Sleep apnea    CPAP  . Spinal stenosis     Past Surgical History:  Procedure Laterality Date  . AMPUTATION    . AV FISTULA PLACEMENT Left 04/21/2017   Procedure:  ARTERIOVENOUS (AV) FISTULA CREATION ( BRACHIOCEPHALIC );  Surgeon: Algernon Huxley, MD;  Location: [facility];  Service: Vascular;  Laterality: Left;  . CHOLECYSTECTOMY  1998  . DIALYSIS/PERMA CATHETER INSERTION N/A 01/03/2017   Procedure: DIALYSIS/PERMA CATHETER INSERTION;  Surgeon: Algernon Huxley, MD;  Location: [facility];  Service: Cardiovascular;  Laterality: N/A;  . DIALYSIS/PERMA CATHETER INSERTION N/A 01/31/2017   Procedure: DIALYSIS/PERMA CATHETER INSERTION;  Surgeon: Algernon Huxley, MD;  Location: [facility];  Service: Cardiovascular;  Laterality: N/A;  . DIALYSIS/PERMA CATHETER REMOVAL N/A 01/24/2017   Procedure: DIALYSIS/PERMA CATHETER REMOVAL;  Surgeon: Algernon Huxley, MD;  Location: [facility];  Service: Cardiovascular;  Laterality: N/A;  . DIALYSIS/PERMA CATHETER REMOVAL N/A 08/02/2017   Procedure: DIALYSIS/PERMA CATHETER REMOVAL;  Surgeon: Katha Cabal, MD;  Location: [facility];  Service: Cardiovascular;  Laterality: N/A;  . HERNIA REPAIR     2017 when appendix removed  . LAPAROSCOPIC APPENDECTOMY N/A 01/06/2015   Procedure: APPENDECTOMY LAPAROSCOPIC drainage of peritoneal abcess;  Surgeon: Sherri Rad, MD;  Location: [facility];  Service: General;  Laterality: N/A;  . TEE WITHOUT CARDIOVERSION N/A 01/28/2017   Procedure: TRANSESOPHAGEAL ECHOCARDIOGRAM (TEE);  Surgeon: Corey Skains, MD;  Location: [facility];  Service: Cardiovascular;  Laterality: N/A;    Social History   Socioeconomic History  . Marital status: Married  Spouse name: Not on file  . Number of children: Not on file  . Years of education: Not on file  . Highest education level: Not on file  Occupational History  . Not on file  Social Needs  . Financial resource strain: Not hard at all  . Food insecurity:    Worry: Patient refused    Inability: Patient refused  . Transportation needs:    Medical: Patient refused    Non-medical: Patient refused  Tobacco Use  . Smoking  status: Current Every Day Smoker    Packs/day: 0.25    Types: Cigarettes  . Smokeless tobacco: Never Used  Substance and Sexual Activity  . Alcohol use: No  . Drug use: No  . Sexual activity: Not on file  Lifestyle  . Physical activity:    Days per week: Patient refused    Minutes per session: Patient refused  . Stress: Not at all  Relationships  . Social connections:    Talks on phone: Patient refused    Gets together: Patient refused    Attends religious service: Patient refused    Active member of club or organization: Patient refused    Attends meetings of clubs or organizations: Patient refused    Relationship status: Patient refused  . Intimate partner violence:    Fear of current or ex partner: Not on file    Emotionally abused: Not on file    Physically abused: Not on file    Forced sexual activity: Not on file  Other Topics Concern  . Not on file  Social History Narrative  . Not on file    Family History  Problem Relation Age of Onset  . Hypertension Mother   . Hyperlipidemia Mother   . Heart disease Father   . Heart disease Maternal Grandfather     Allergies  Allergen Reactions  . Naproxen Rash     Review of Systems   Review of Systems: Negative Unless Checked Constitutional: [] Weight loss  [] Fever  [] Chills Cardiac: [] Chest pain   []  Atrial Fibrillation  [] Palpitations   [] Shortness of breath when laying flat   [] Shortness of breath with exertion. [] Shortness of breath at rest Vascular:  [] Pain in legs with walking   [] Pain in legs with standing [] Pain in legs when laying flat   [] Claudication    [x] Pain in feet when laying flat    [] History of DVT   [] Phlebitis   [x] Swelling in legs   [] Varicose veins   [x] Non-healing ulcers Pulmonary:   [] Uses home oxygen   [] Productive cough   [] Hemoptysis   [] Wheeze  [x] COPD   [] Asthma Neurologic:  [] Dizziness   [] Seizures  [] Blackouts [] History of stroke   [] History of TIA  [] Aphasia   [] Temporary Blindness    [] Weakness or numbness in arm   [x] Weakness or numbness in leg Musculoskeletal:   [] Joint swelling   [] Joint pain   [x] Low back pain  []  History of Knee Replacement [] Arthritis [] back Surgeries  [x]  Spinal Stenosis    Hematologic:  [] Easy bruising  [] Easy bleeding   [] Hypercoagulable state   [] Anemic Gastrointestinal:  [] Diarrhea   [] Vomiting  [] Gastroesophageal reflux/heartburn   [] Difficulty swallowing. [] Abdominal pain Genitourinary:  [x] Chronic kidney disease   [] Difficult urination  [] Anuric   [] Blood in urine [] Frequent urination  [] Burning with urination   [] Hematuria Skin:  [] Rashes   [x] Ulcers [] Wounds Psychological:  [] History of anxiety   []  History of major depression  []  Memory Difficulties     Objective:  Physical Exam  BP 105/64   Pulse 85   Resp 16   Ht 5\' 10"  (1.778 m)   Wt 272 lb (123.4 kg)   BMI 39.03 kg/m   Gen: WD/WN, NAD Head: [place]/AT, No temporalis wasting.  Ear/Nose/Throat: Hearing grossly intact, nares w/o erythema or drainage Eyes: PER, EOMI, sclera nonicteric.  Neck: Supple, no masses.  No JVD.  Pulmonary:  Good air movement, no use of accessory muscles.  Cardiac: RRR Vascular: Ulceration of Right big toe.  Area under big toe appears to have early gangrene changes Vessel Right Left  Radial Palpable Palpable  Dorsalis Pedis Non Palpable Non Palpable  Posterior Tibial Non Palpable Non Palpable   Gastrointestinal: soft, non-distended. No guarding/no peritoneal signs.  Musculoskeletal: M/S 5/5 throughout.  Ambulates with walker. Left big toe amputation  Neurologic: Pain and light touch intact in extremities.  Symmetrical.  Speech is fluent. Motor exam as listed above. Psychiatric: Judgment intact, Mood & affect appropriate for pt's clinical situation. Dermatologic: No Venous rashes.No changes consistent with cellulitis. Lymph : No Cervical lymphadenopathy, no lichenification or skin changes of chronic lymphedema.      Assessment & Plan:   1.  Atherosclerosis of native arteries of the extremities with ulceration ([place]) The patient underwent bilateral ABIs today.  They are noncompressible bilaterally.  His bilateral tibial waveforms are monophasic.  The right great toe has nearly flat waveforms.  All other digits on his right foot have flat waveforms.  His left lower extremity has abnormal waveforms with the second and fourth digit nearly flat.   Recommend:  The patient has evidence of severe atherosclerotic changes of both lower extremities associated with ulceration and tissue loss of the foot.  This represents a limb threatening ischemia and places the patient at the risk for limb loss.  Patient should undergo angiography of the right lower extremities with the hope for intervention for limb salvage.  The risks and benefits as well as the alternative therapies was discussed in detail with the patient.  All questions were answered.  Patient agrees to proceed with angiography.  Patient will also likely need intervention on his left lower extremity, however at this time we will focus on the right lower extremity due to the risk of tissue loss and potential limb loss.  The patient will follow up with me in the office after the procedure.     2. Chronic obstructive pulmonary disease, unspecified COPD type ([place]) Continue pulmonary medications and aerosols as already ordered, these medications have been reviewed and there are no changes at this time.    3. Controlled type 2 diabetes mellitus with chronic kidney disease on chronic dialysis, with long-term current use of insulin ([place]) Continue hypoglycemic medications as already ordered, these medications have been reviewed and there are no changes at this time.  Hgb A1C to be monitored as already arranged by primary service   4. Hyperlipidemia, unspecified hyperlipidemia type Continue statin as ordered and reviewed, no changes at this time    Current Outpatient Medications on File  Prior to Visit  Medication Sig Dispense Refill  . albuterol (PROVENTIL HFA;VENTOLIN HFA) 108 (90 Base) MCG/ACT inhaler Inhale 2 puffs into the lungs every 4 (four) hours as needed for wheezing or shortness of breath.     Marland Kitchen aspirin EC 81 MG EC tablet Take 1 tablet (81 mg total) by mouth daily. 30 tablet 0  . atorvastatin (LIPITOR) 80 MG tablet Take 80 mg daily by mouth.    . carvedilol (COREG)  3.125 MG tablet Take 1 tablet (3.125 mg total) by mouth 2 (two) times daily with a meal. 60 tablet 0  . fluticasone (FLOVENT HFA) 220 MCG/ACT inhaler Inhale 2 puffs into the lungs 2 (two) times daily.    . furosemide (LASIX) 40 MG tablet Take 40 mg by mouth 2 (two) times daily. Take 40 mg by mouth daily on non dialysis days Tuesday, Thursday, Saturday and Sunday    . ibuprofen (ADVIL,MOTRIN) 200 MG tablet Take 400 mg by mouth daily as needed for headache or moderate pain.     Marland Kitchen levothyroxine (SYNTHROID, LEVOTHROID) 50 MCG tablet Take 50 mcg daily before breakfast by mouth.    . lidocaine-prilocaine (EMLA) cream Apply 1 application topically as needed (for port access).   11  . losartan (COZAAR) 25 MG tablet Take 25 mg by mouth daily.    . sertraline (ZOLOFT) 100 MG tablet Take 100 mg by mouth daily.    . sevelamer carbonate (RENVELA) 800 MG tablet Take 800 mg by mouth 3 (three) times daily with meals.    . DULoxetine (CYMBALTA) 60 MG capsule Take 60 mg by mouth daily.    Marland Kitchen gabapentin (NEURONTIN) 300 MG capsule TAKE 1 CAPSULE BY MOUTH ONCE DAILY AT NIGHT  1   No current facility-administered medications on file prior to visit.     There are no Patient Instructions on file for this visit. No follow-ups on file.   Kris Hartmann, NP  This note was completed with Sales executive.  Any errors are purely unintentional.

## 2018-02-13 ENCOUNTER — Other Ambulatory Visit (INDEPENDENT_AMBULATORY_CARE_PROVIDER_SITE_OTHER): Payer: Self-pay | Admitting: Nurse Practitioner

## 2018-02-13 ENCOUNTER — Ambulatory Visit
Admission: RE | Admit: 2018-02-13 | Discharge: 2018-02-13 | Disposition: A | Discharge status: Home / Self Care | Payer: Medicare Other | Attending: Vascular Surgery | Admitting: Vascular Surgery

## 2018-02-13 ENCOUNTER — Encounter
Admission: RE | Disposition: A | Discharge status: Home / Self Care | Payer: Self-pay | Source: Home / Self Care | Attending: Vascular Surgery

## 2018-02-13 ENCOUNTER — Other Ambulatory Visit: Payer: Self-pay

## 2018-02-13 DIAGNOSIS — I132 Hypertensive heart and chronic kidney disease with heart failure and with stage 5 chronic kidney disease, or end stage renal disease: Secondary | ICD-10-CM | POA: Diagnosis not present

## 2018-02-13 DIAGNOSIS — L97909 Non-pressure chronic ulcer of unspecified part of unspecified lower leg with unspecified severity: Secondary | ICD-10-CM

## 2018-02-13 DIAGNOSIS — Z89412 Acquired absence of left great toe: Secondary | ICD-10-CM | POA: Diagnosis not present

## 2018-02-13 DIAGNOSIS — Z992 Dependence on renal dialysis: Secondary | ICD-10-CM | POA: Insufficient documentation

## 2018-02-13 DIAGNOSIS — Z79890 Hormone replacement therapy (postmenopausal): Secondary | ICD-10-CM | POA: Diagnosis not present

## 2018-02-13 DIAGNOSIS — I252 Old myocardial infarction: Secondary | ICD-10-CM | POA: Insufficient documentation

## 2018-02-13 DIAGNOSIS — F17210 Nicotine dependence, cigarettes, uncomplicated: Secondary | ICD-10-CM | POA: Diagnosis not present

## 2018-02-13 DIAGNOSIS — E1122 Type 2 diabetes mellitus with diabetic chronic kidney disease: Secondary | ICD-10-CM | POA: Diagnosis not present

## 2018-02-13 DIAGNOSIS — I7025 Atherosclerosis of native arteries of other extremities with ulceration: Secondary | ICD-10-CM

## 2018-02-13 DIAGNOSIS — L97519 Non-pressure chronic ulcer of other part of right foot with unspecified severity: Secondary | ICD-10-CM

## 2018-02-13 DIAGNOSIS — E1169 Type 2 diabetes mellitus with other specified complication: Secondary | ICD-10-CM | POA: Diagnosis not present

## 2018-02-13 DIAGNOSIS — I70299 Other atherosclerosis of native arteries of extremities, unspecified extremity: Secondary | ICD-10-CM

## 2018-02-13 DIAGNOSIS — N186 End stage renal disease: Secondary | ICD-10-CM | POA: Insufficient documentation

## 2018-02-13 DIAGNOSIS — Z79899 Other long term (current) drug therapy: Secondary | ICD-10-CM | POA: Diagnosis not present

## 2018-02-13 DIAGNOSIS — Z7951 Long term (current) use of inhaled steroids: Secondary | ICD-10-CM | POA: Insufficient documentation

## 2018-02-13 DIAGNOSIS — E11621 Type 2 diabetes mellitus with foot ulcer: Secondary | ICD-10-CM | POA: Insufficient documentation

## 2018-02-13 DIAGNOSIS — E785 Hyperlipidemia, unspecified: Secondary | ICD-10-CM | POA: Diagnosis not present

## 2018-02-13 DIAGNOSIS — Z888 Allergy status to other drugs, medicaments and biological substances status: Secondary | ICD-10-CM | POA: Diagnosis not present

## 2018-02-13 DIAGNOSIS — Z8249 Family history of ischemic heart disease and other diseases of the circulatory system: Secondary | ICD-10-CM | POA: Diagnosis not present

## 2018-02-13 DIAGNOSIS — I70235 Atherosclerosis of native arteries of right leg with ulceration of other part of foot: Secondary | ICD-10-CM | POA: Insufficient documentation

## 2018-02-13 DIAGNOSIS — L97511 Non-pressure chronic ulcer of other part of right foot limited to breakdown of skin: Secondary | ICD-10-CM | POA: Insufficient documentation

## 2018-02-13 DIAGNOSIS — E1140 Type 2 diabetes mellitus with diabetic neuropathy, unspecified: Secondary | ICD-10-CM | POA: Diagnosis not present

## 2018-02-13 DIAGNOSIS — Z7982 Long term (current) use of aspirin: Secondary | ICD-10-CM | POA: Diagnosis not present

## 2018-02-13 DIAGNOSIS — I509 Heart failure, unspecified: Secondary | ICD-10-CM | POA: Diagnosis not present

## 2018-02-13 HISTORY — PX: LOWER EXTREMITY ANGIOGRAPHY: CATH118251

## 2018-02-13 LAB — BASIC METABOLIC PANEL
Anion gap: 16 — ABNORMAL HIGH (ref 5–15)
BUN: 97 mg/dL — ABNORMAL HIGH (ref 6–20)
CO2: 21 mmol/L — ABNORMAL LOW (ref 22–32)
Calcium: 7 mg/dL — ABNORMAL LOW (ref 8.9–10.3)
Chloride: 101 mmol/L (ref 98–111)
Creatinine, Ser: 7.83 mg/dL — ABNORMAL HIGH (ref 0.61–1.24)
GFR calc Af Amer: 8 mL/min — ABNORMAL LOW (ref >60)
GFR calc non Af Amer: 7 mL/min — ABNORMAL LOW (ref >60)
GLUCOSE: 123 mg/dL — AB (ref 70–99)
Potassium: 4.1 mmol/L (ref 3.5–5.1)
Sodium: 138 mmol/L (ref 135–145)

## 2018-02-13 LAB — GLUCOSE, CAPILLARY
Glucose-Capillary: 118 mg/dL — ABNORMAL HIGH (ref 70–99)
Glucose-Capillary: 93 mg/dL (ref 70–99)

## 2018-02-13 SURGERY — LOWER EXTREMITY ANGIOGRAPHY
Anesthesia: Moderate Sedation | Laterality: Right

## 2018-02-13 MED ORDER — MIDAZOLAM HCL 5 MG/5ML IJ SOLN
INTRAMUSCULAR | Status: AC
  Filled 2018-02-13: qty 5

## 2018-02-13 MED ORDER — ONDANSETRON HCL 4 MG/2ML IJ SOLN: 4.0000 mg | Freq: Four times a day (QID) | INTRAMUSCULAR | Status: DC | PRN

## 2018-02-13 MED ORDER — FENTANYL CITRATE (PF) 100 MCG/2ML IJ SOLN
INTRAMUSCULAR | Status: AC
  Filled 2018-02-13: qty 2

## 2018-02-13 MED ORDER — LIDOCAINE-EPINEPHRINE (PF) 1 %-1:200000 IJ SOLN
INTRAMUSCULAR | Status: AC
  Filled 2018-02-13: qty 10

## 2018-02-13 MED ORDER — SODIUM CHLORIDE 0.9 % IV SOLN
INTRAVENOUS | Status: DC
  Administered 2018-02-13: 14:00:00 via INTRAVENOUS

## 2018-02-13 MED ORDER — MIDAZOLAM HCL 2 MG/2ML IJ SOLN
INTRAMUSCULAR | Status: DC | PRN
  Administered 2018-02-13: 2 mg via INTRAVENOUS
  Administered 2018-02-13 (×4): 1 mg via INTRAVENOUS

## 2018-02-13 MED ORDER — IOPAMIDOL (ISOVUE-300) INJECTION 61%
INTRAVENOUS | Status: DC | PRN
  Administered 2018-02-13: 70 mL via INTRA_ARTERIAL

## 2018-02-13 MED ORDER — CEFAZOLIN SODIUM-DEXTROSE 1-4 GM/50ML-% IV SOLN
1.0000 g | Freq: Once | INTRAVENOUS | Status: AC
  Administered 2018-02-13: 1 g via INTRAVENOUS

## 2018-02-13 MED ORDER — HEPARIN (PORCINE) IN NACL 1000-0.9 UT/500ML-% IV SOLN
INTRAVENOUS | Status: AC
  Filled 2018-02-13: qty 1000

## 2018-02-13 MED ORDER — HEPARIN SODIUM (PORCINE) 1000 UNIT/ML IJ SOLN
INTRAMUSCULAR | Status: DC | PRN
  Administered 2018-02-13: 5000 [IU] via INTRAVENOUS

## 2018-02-13 MED ORDER — FENTANYL CITRATE (PF) 100 MCG/2ML IJ SOLN
INTRAMUSCULAR | Status: DC | PRN
  Administered 2018-02-13 (×2): 25 ug via INTRAVENOUS
  Administered 2018-02-13: 50 ug via INTRAVENOUS
  Administered 2018-02-13 (×2): 25 ug via INTRAVENOUS

## 2018-02-13 MED ORDER — HYDROMORPHONE HCL 1 MG/ML IJ SOLN: 1.0000 mg | Freq: Once | INTRAMUSCULAR | Status: DC | PRN

## 2018-02-13 MED ORDER — SODIUM CHLORIDE FLUSH 0.9 % IV SOLN
INTRAVENOUS | Status: AC
  Filled 2018-02-13: qty 20

## 2018-02-13 MED ORDER — HEPARIN SODIUM (PORCINE) 1000 UNIT/ML IJ SOLN
INTRAMUSCULAR | Status: AC
  Filled 2018-02-13: qty 1

## 2018-02-13 MED ORDER — CLOPIDOGREL BISULFATE 75 MG PO TABS: 75.0000 mg | ORAL_TABLET | Freq: Every day | ORAL | 11 refills | Status: AC

## 2018-02-13 MED ORDER — CEFAZOLIN SODIUM-DEXTROSE 2-4 GM/100ML-% IV SOLN
INTRAVENOUS | Status: AC
  Filled 2018-02-13: qty 100

## 2018-02-13 SURGICAL SUPPLY — 19 items
BALLN LUTONIX 018 5X150X130 (BALLOONS) ×2
BALLN ULTRVRSE 3X220X150 (BALLOONS) ×2
BALLOON LUTONIX 018 5X150X130 (BALLOONS) ×1 IMPLANT
BALLOON ULTRVRSE 3X220X150 (BALLOONS) ×1 IMPLANT
CATH BEACON 5 .038 100 VERT TP (CATHETERS) ×2 IMPLANT
CATH CXI SUPP ANG 4FR 135 (CATHETERS) ×1 IMPLANT
CATH CXI SUPP ANG 4FR 135CM (CATHETERS) ×2
CATH PIG 70CM (CATHETERS) ×2 IMPLANT
DEVICE PRESTO INFLATION (MISCELLANEOUS) ×2 IMPLANT
DEVICE STARCLOSE SE CLOSURE (Vascular Products) ×2 IMPLANT
GLIDEWIRE ADV .035X260CM (WIRE) ×2 IMPLANT
GUIDEWIRE PFTE-COATED .018X300 (WIRE) ×2 IMPLANT
PACK ANGIOGRAPHY (CUSTOM PROCEDURE TRAY) ×2 IMPLANT
SHEATH ANL2 6FRX45 HC (SHEATH) ×2 IMPLANT
SHEATH BRITE TIP 5FRX11 (SHEATH) ×2 IMPLANT
SYR MEDRAD MARK V 150ML (SYRINGE) ×2 IMPLANT
TUBING CONTRAST HIGH PRESS 72 (TUBING) ×2 IMPLANT
WIRE G V18X300CM (WIRE) ×2 IMPLANT
WIRE J 3MM .035X145CM (WIRE) ×2 IMPLANT

## 2018-02-13 NOTE — Op Note (Signed)
[facility]  Percutaneous Study/Intervention Procedural Note   Date of Surgery: 02/13/2018  Surgeon(s):DEW,JASON    Assistants:none  Pre-operative Diagnosis: PAD with ulceration right lower extremity  Post-operative diagnosis:  Same  Procedure(s) Performed:             1.  Ultrasound guidance for vascular access left femoral artery             2.  Catheter placement into right SFA from left femoral approach             3.  Aortogram and selective right lower extremity angiogram including selective images of the peroneal artery and the posterior tibial artery             4.  Percutaneous transluminal angioplasty of the right peroneal artery and tibioperoneal trunk with 3 mm diameter by 22 cm length angioplasty balloon             5.   Percutaneous transluminal angioplasty of the right posterior tibial artery with 3 mm diameter by 22 cm length angioplasty balloon  6.  Percutaneous transluminal angioplasty of the right SFA and proximal popliteal artery with 5 mm diameter by 15 cm length Lutonix drug-coated angioplasty balloon             7.  StarClose closure device left femoral artery  EBL: 10 cc  Contrast: 70 cc  Fluoro Time: 9.3 minutes  Moderate Conscious Sedation Time: approximately 45 minutes using 6 mg of Versed and 150 mcg of Fentanyl              Indications:  Patient is a 57 y.o.male with non-healing ulceration of the right foot. The patient has noninvasive study showing monophasic flow in both lower extremities distally. The patient is brought in for angiography for further evaluation and potential treatment.  Due to the limb threatening nature of the situation, angiogram was performed for attempted limb salvage. The patient is aware that if the procedure fails, amputation would be expected.  The patient also understands that even with successful revascularization, amputation may still be required due to the severity of the situation. The right leg will  be addressed initially due to the ulceration. Risks and benefits are discussed and informed consent is obtained.   Procedure:  The patient was identified and appropriate procedural time out was performed.  The patient was then placed supine on the table and prepped and draped in the usual sterile fashion. Moderate conscious sedation was administered during a face to face encounter with the patient throughout the procedure with my supervision of the RN administering medicines and monitoring the patient's vital signs, pulse oximetry, telemetry and mental status throughout from the start of the procedure until the patient was taken to the recovery room. Ultrasound was used to evaluate the left common femoral artery.  It was patent .  A digital ultrasound image was acquired.  A Seldinger needle was used to access the left common femoral artery under direct ultrasound guidance and a permanent image was performed.  A 0.035 J wire was advanced without resistance and a 5Fr sheath was placed.  Pigtail catheter was placed into the aorta and an AP aortogram was performed. This demonstrated normal renal arteries and normal aorta and iliac segments without significant stenosis. I then crossed the aortic bifurcation and advanced to the rigth femoral head and then into the proximal right superficial femoral artery with the pigtail catheter using the J-wire. Selective right lower extremity angiogram was  then performed. This demonstrated 70 to 80% stenosis in the mid SFA with calcific but not stenotic profunda femoris artery and popliteal artery.  The tibial arteries were heavily diseased.  The posterior tibial artery had a separate somewhat higher takeoff just below the knee and had a moderate length occlusion in the proximal to mid segment.  The tibioperoneal trunk connected and occluded anterior tibial artery and a peroneal artery with high-grade stenosis in the proximal to mid segment of over 80%.  It was then continuous  distally after this area of stenosis. It was felt that it was in the patient's best interest to proceed with intervention after these images to avoid a second procedure and a larger amount of contrast and fluoroscopy based off of the findings from the initial angiogram. The patient was systemically heparinized and a 6 [place] Ansell sheath was then placed over the [employer] wire. I then used a Kumpe catheter and the advantage wire to navigate through the SFA stenosis and down into the tibioperoneal trunk.  I then exchanged for a V 18 wire and a CXI catheter and easily cross the peroneal artery stenosis without difficulty following selective injection of the peroneal artery to evaluate the disease.  A 3 mm diameter by 22 cm length angioplasty balloon was then inflated in the proximal to mid peroneal artery up to the tibioperoneal trunk.  This was taken to 10 atm for 1 minute.  Completion imaging showed markedly improved flow with less than 20% residual stenosis in the peroneal artery or tibioperoneal trunk.  I then turned my attention to the posterior tibial artery which again had a separate more proximal takeoff.  This was cannulated initially with the Kumpe catheter and then we exchanged for a CXI catheter and a 0.018 advantage wire.  This was a little more difficult to cross, but ultimately after selective imaging through the CXI catheter to use as a roadmap I was able to cross the occlusion and parked the wire in the foot.  Angioplasty was then performed with 2 inflations with a 3 mm diameter by 22 cm length angioplasty balloon from the origin of the posterior tibial artery down to the mid to distal posterior tibial artery.  Both inflations were 10 to 12 atm for 1 minute.  Completion imaging showed about 30% residual stenosis proximally and about a 20% residual stenosis in the midsegment, but the flow was markedly improved.  I then turned my attention to the SFA.  This was treated with a 5 mm diameter by 15  cm length Lutonix drug-coated angioplasty balloon inflated from the mid SFA down to the most proximal popliteal artery.  This was taken to 14 atm for 1 minute.  Completion imaging showed about a 20 to 25% residual stenosis in the SFA which was not flow-limiting. I elected to terminate the procedure. The sheath was removed and StarClose closure device was deployed in the left femoral artery with excellent hemostatic result. The patient was taken to the recovery room in stable condition having tolerated the procedure well.  Findings:               Aortogram:  normal aorta and iliac arteries with no stenosis. Renal arteries show no significant stenosis             Right Lower Extremity:  70 to 80% stenosis in the mid SFA with calcific but not stenotic profunda femoris artery and popliteal artery.  The tibial arteries were heavily diseased.  The posterior tibial  artery had a separate somewhat higher takeoff just below the knee and had a moderate length occlusion in the proximal to mid segment.  The tibioperoneal trunk connected and occluded anterior tibial artery and a peroneal artery with high-grade stenosis in the proximal to mid segment of over 80%.  It was then continuous distally after this area of stenosis.   Disposition: Patient was taken to the recovery room in stable condition having tolerated the procedure well.  Complications: None  Leotis Pain 02/13/2018 4:32 PM   This note was created with Dragon Medical transcription system. Any errors in dictation are purely unintentional.

## 2018-02-13 NOTE — H&P (Signed)
[facility] History & Physical Update  The patient was interviewed and re-examined.  The patient's previous History and Physical has been reviewed and is unchanged.  There is no change in the plan of care. We plan to proceed with the scheduled procedure.  Leotis Pain, MD  02/13/2018, 12:39 PM

## 2018-02-14 ENCOUNTER — Encounter: Payer: Self-pay | Admitting: Vascular Surgery

## 2018-02-22 ENCOUNTER — Emergency Department: Payer: Medicare Other

## 2018-02-22 ENCOUNTER — Other Ambulatory Visit: Payer: Self-pay

## 2018-02-22 ENCOUNTER — Inpatient Hospital Stay
Admission: EM | Admit: 2018-02-22 | Discharge: 2018-02-24 | DRG: 239 | Disposition: A | Discharge status: Home / Self Care | Payer: Medicare Other | Attending: Internal Medicine | Admitting: Internal Medicine

## 2018-02-22 DIAGNOSIS — I959 Hypotension, unspecified: Secondary | ICD-10-CM | POA: Diagnosis present

## 2018-02-22 DIAGNOSIS — E039 Hypothyroidism, unspecified: Secondary | ICD-10-CM | POA: Diagnosis present

## 2018-02-22 DIAGNOSIS — Z6841 Body Mass Index (BMI) 40.0 and over, adult: Secondary | ICD-10-CM

## 2018-02-22 DIAGNOSIS — D631 Anemia in chronic kidney disease: Secondary | ICD-10-CM | POA: Diagnosis present

## 2018-02-22 DIAGNOSIS — E7800 Pure hypercholesterolemia, unspecified: Secondary | ICD-10-CM | POA: Diagnosis present

## 2018-02-22 DIAGNOSIS — Z7902 Long term (current) use of antithrombotics/antiplatelets: Secondary | ICD-10-CM

## 2018-02-22 DIAGNOSIS — Z7951 Long term (current) use of inhaled steroids: Secondary | ICD-10-CM

## 2018-02-22 DIAGNOSIS — I252 Old myocardial infarction: Secondary | ICD-10-CM

## 2018-02-22 DIAGNOSIS — Z886 Allergy status to analgesic agent status: Secondary | ICD-10-CM

## 2018-02-22 DIAGNOSIS — E1152 Type 2 diabetes mellitus with diabetic peripheral angiopathy with gangrene: Principal | ICD-10-CM | POA: Diagnosis present

## 2018-02-22 DIAGNOSIS — E876 Hypokalemia: Secondary | ICD-10-CM | POA: Diagnosis present

## 2018-02-22 DIAGNOSIS — Z79899 Other long term (current) drug therapy: Secondary | ICD-10-CM | POA: Diagnosis not present

## 2018-02-22 DIAGNOSIS — N2581 Secondary hyperparathyroidism of renal origin: Secondary | ICD-10-CM | POA: Diagnosis present

## 2018-02-22 DIAGNOSIS — N186 End stage renal disease: Secondary | ICD-10-CM | POA: Diagnosis present

## 2018-02-22 DIAGNOSIS — Z992 Dependence on renal dialysis: Secondary | ICD-10-CM | POA: Diagnosis not present

## 2018-02-22 DIAGNOSIS — F17210 Nicotine dependence, cigarettes, uncomplicated: Secondary | ICD-10-CM | POA: Diagnosis present

## 2018-02-22 DIAGNOSIS — I7092 Chronic total occlusion of artery of the extremities: Secondary | ICD-10-CM | POA: Diagnosis not present

## 2018-02-22 DIAGNOSIS — J449 Chronic obstructive pulmonary disease, unspecified: Secondary | ICD-10-CM | POA: Diagnosis present

## 2018-02-22 DIAGNOSIS — Z9989 Dependence on other enabling machines and devices: Secondary | ICD-10-CM | POA: Diagnosis not present

## 2018-02-22 DIAGNOSIS — G4733 Obstructive sleep apnea (adult) (pediatric): Secondary | ICD-10-CM | POA: Diagnosis present

## 2018-02-22 DIAGNOSIS — E1122 Type 2 diabetes mellitus with diabetic chronic kidney disease: Secondary | ICD-10-CM | POA: Diagnosis present

## 2018-02-22 DIAGNOSIS — I70261 Atherosclerosis of native arteries of extremities with gangrene, right leg: Secondary | ICD-10-CM | POA: Diagnosis not present

## 2018-02-22 DIAGNOSIS — I132 Hypertensive heart and chronic kidney disease with heart failure and with stage 5 chronic kidney disease, or end stage renal disease: Secondary | ICD-10-CM | POA: Diagnosis present

## 2018-02-22 DIAGNOSIS — E1140 Type 2 diabetes mellitus with diabetic neuropathy, unspecified: Secondary | ICD-10-CM | POA: Diagnosis present

## 2018-02-22 DIAGNOSIS — I5032 Chronic diastolic (congestive) heart failure: Secondary | ICD-10-CM | POA: Diagnosis present

## 2018-02-22 DIAGNOSIS — M79673 Pain in unspecified foot: Secondary | ICD-10-CM

## 2018-02-22 DIAGNOSIS — Z89412 Acquired absence of left great toe: Secondary | ICD-10-CM

## 2018-02-22 DIAGNOSIS — I96 Gangrene, not elsewhere classified: Secondary | ICD-10-CM | POA: Diagnosis present

## 2018-02-22 LAB — COMPREHENSIVE METABOLIC PANEL
ALT: 8 U/L (ref 0–44)
ANION GAP: 14 (ref 5–15)
AST: 12 U/L — ABNORMAL LOW (ref 15–41)
Albumin: 3.3 g/dL — ABNORMAL LOW (ref 3.5–5.0)
Alkaline Phosphatase: 83 U/L (ref 38–126)
BUN: 45 mg/dL — ABNORMAL HIGH (ref 6–20)
CO2: 24 mmol/L (ref 22–32)
Calcium: 7.6 mg/dL — ABNORMAL LOW (ref 8.9–10.3)
Chloride: 96 mmol/L — ABNORMAL LOW (ref 98–111)
Creatinine, Ser: 4.91 mg/dL — ABNORMAL HIGH (ref 0.61–1.24)
GFR calc Af Amer: 14 mL/min — ABNORMAL LOW (ref >60)
GFR calc non Af Amer: 12 mL/min — ABNORMAL LOW (ref >60)
Glucose, Bld: 199 mg/dL — ABNORMAL HIGH (ref 70–99)
POTASSIUM: 2.8 mmol/L — AB (ref 3.5–5.1)
Sodium: 134 mmol/L — ABNORMAL LOW (ref 135–145)
Total Bilirubin: 0.5 mg/dL (ref 0.3–1.2)
Total Protein: 7.5 g/dL (ref 6.5–8.1)

## 2018-02-22 LAB — CBC WITH DIFFERENTIAL/PLATELET
Abs Immature Granulocytes: 0.12 10*3/uL — ABNORMAL HIGH (ref 0.00–0.07)
Basophils Absolute: 0.1 10*3/uL (ref 0.0–0.1)
Basophils Relative: 1 %
Eosinophils Absolute: 0.2 10*3/uL (ref 0.0–0.5)
Eosinophils Relative: 1 %
HCT: 36 % — ABNORMAL LOW (ref 39.0–52.0)
Hemoglobin: 11.5 g/dL — ABNORMAL LOW (ref 13.0–17.0)
Immature Granulocytes: 1 %
LYMPHS ABS: 1.1 10*3/uL (ref 0.7–4.0)
Lymphocytes Relative: 6 %
MCH: 31.5 pg (ref 26.0–34.0)
MCHC: 31.9 g/dL (ref 30.0–36.0)
MCV: 98.6 fL (ref 80.0–100.0)
Monocytes Absolute: 0.5 10*3/uL (ref 0.1–1.0)
Monocytes Relative: 3 %
NRBC: 0 % (ref 0.0–0.2)
Neutro Abs: 15 10*3/uL — ABNORMAL HIGH (ref 1.7–7.7)
Neutrophils Relative %: 88 %
Platelets: 254 10*3/uL (ref 150–400)
RBC: 3.65 MIL/uL — ABNORMAL LOW (ref 4.22–5.81)
RDW: 15.7 % — ABNORMAL HIGH (ref 11.5–15.5)
WBC: 16.8 10*3/uL — AB (ref 4.0–10.5)

## 2018-02-22 LAB — GLUCOSE, CAPILLARY: Glucose-Capillary: 182 mg/dL — ABNORMAL HIGH (ref 70–99)

## 2018-02-22 MED ORDER — INSULIN ASPART 100 UNIT/ML ~~LOC~~ SOLN: 0.0000 [IU] | Freq: Every day | SUBCUTANEOUS | Status: DC

## 2018-02-22 MED ORDER — LEVOTHYROXINE SODIUM 50 MCG PO TABS
50.0000 ug | ORAL_TABLET | Freq: Every day | ORAL | Status: DC
  Administered 2018-02-23 – 2018-02-24 (×2): 50 ug via ORAL
  Filled 2018-02-22 (×2): qty 1

## 2018-02-22 MED ORDER — SEVELAMER CARBONATE 800 MG PO TABS: 800.0000 mg | ORAL_TABLET | Freq: Three times a day (TID) | ORAL | Status: DC

## 2018-02-22 MED ORDER — FUROSEMIDE 40 MG PO TABS
40.0000 mg | ORAL_TABLET | ORAL | Status: DC
  Administered 2018-02-23 (×2): 40 mg via ORAL
  Filled 2018-02-22 (×2): qty 1

## 2018-02-22 MED ORDER — ATORVASTATIN CALCIUM 20 MG PO TABS
80.0000 mg | ORAL_TABLET | Freq: Every day | ORAL | Status: DC
  Administered 2018-02-23 – 2018-02-24 (×2): 80 mg via ORAL
  Filled 2018-02-22 (×2): qty 4

## 2018-02-22 MED ORDER — SODIUM CHLORIDE 0.9 % IV SOLN
3.0000 g | Freq: Two times a day (BID) | INTRAVENOUS | Status: DC
  Administered 2018-02-22: 3 g via INTRAVENOUS
  Filled 2018-02-22 (×2): qty 3

## 2018-02-22 MED ORDER — ONDANSETRON HCL 4 MG/2ML IJ SOLN: 4.0000 mg | Freq: Four times a day (QID) | INTRAMUSCULAR | Status: DC | PRN

## 2018-02-22 MED ORDER — GABAPENTIN 300 MG PO CAPS
300.0000 mg | ORAL_CAPSULE | Freq: Every day | ORAL | Status: DC
  Administered 2018-02-22 – 2018-02-23 (×2): 300 mg via ORAL
  Filled 2018-02-22 (×2): qty 1

## 2018-02-22 MED ORDER — CLOPIDOGREL BISULFATE 75 MG PO TABS
75.0000 mg | ORAL_TABLET | Freq: Every day | ORAL | Status: DC
  Administered 2018-02-23 – 2018-02-24 (×2): 75 mg via ORAL
  Filled 2018-02-22 (×2): qty 1

## 2018-02-22 MED ORDER — LOSARTAN POTASSIUM 25 MG PO TABS
25.0000 mg | ORAL_TABLET | Freq: Every day | ORAL | Status: DC
  Filled 2018-02-22: qty 1

## 2018-02-22 MED ORDER — ALBUTEROL SULFATE (2.5 MG/3ML) 0.083% IN NEBU: 2.5000 mg | INHALATION_SOLUTION | RESPIRATORY_TRACT | Status: DC | PRN

## 2018-02-22 MED ORDER — ASPIRIN EC 81 MG PO TBEC
81.0000 mg | DELAYED_RELEASE_TABLET | Freq: Every day | ORAL | Status: DC
  Administered 2018-02-23 – 2018-02-24 (×2): 81 mg via ORAL
  Filled 2018-02-22 (×2): qty 1

## 2018-02-22 MED ORDER — ACETAMINOPHEN 650 MG RE SUPP: 650.0000 mg | Freq: Four times a day (QID) | RECTAL | Status: DC | PRN

## 2018-02-22 MED ORDER — BISACODYL 5 MG PO TBEC: 5.0000 mg | DELAYED_RELEASE_TABLET | Freq: Every day | ORAL | Status: DC | PRN

## 2018-02-22 MED ORDER — HEPARIN SODIUM (PORCINE) 5000 UNIT/ML IJ SOLN
5000.0000 [IU] | Freq: Three times a day (TID) | INTRAMUSCULAR | Status: DC
  Administered 2018-02-22 – 2018-02-24 (×4): 5000 [IU] via SUBCUTANEOUS
  Filled 2018-02-22 (×4): qty 1

## 2018-02-22 MED ORDER — ACETAMINOPHEN 325 MG PO TABS: 650.0000 mg | ORAL_TABLET | Freq: Four times a day (QID) | ORAL | Status: DC | PRN

## 2018-02-22 MED ORDER — DOCUSATE SODIUM 100 MG PO CAPS
100.0000 mg | ORAL_CAPSULE | Freq: Two times a day (BID) | ORAL | Status: DC
  Administered 2018-02-22 – 2018-02-24 (×3): 100 mg via ORAL
  Filled 2018-02-22 (×3): qty 1

## 2018-02-22 MED ORDER — POTASSIUM CHLORIDE 20 MEQ PO PACK
40.0000 meq | PACK | Freq: Once | ORAL | Status: AC
  Administered 2018-02-22: 40 meq via ORAL
  Filled 2018-02-22: qty 2

## 2018-02-22 MED ORDER — CARVEDILOL 6.25 MG PO TABS
3.1250 mg | ORAL_TABLET | Freq: Two times a day (BID) | ORAL | Status: DC
  Filled 2018-02-22: qty 1

## 2018-02-22 MED ORDER — ONDANSETRON HCL 4 MG PO TABS: 4.0000 mg | ORAL_TABLET | Freq: Four times a day (QID) | ORAL | Status: DC | PRN

## 2018-02-22 MED ORDER — INSULIN ASPART 100 UNIT/ML ~~LOC~~ SOLN
0.0000 [IU] | Freq: Three times a day (TID) | SUBCUTANEOUS | Status: DC
  Administered 2018-02-23: 2 [IU] via SUBCUTANEOUS
  Administered 2018-02-24: 3 [IU] via SUBCUTANEOUS
  Filled 2018-02-22 (×3): qty 1

## 2018-02-22 MED ORDER — HYDROCODONE-ACETAMINOPHEN 5-325 MG PO TABS
1.0000 | ORAL_TABLET | ORAL | Status: DC | PRN
  Administered 2018-02-22: 1 via ORAL
  Administered 2018-02-24 (×3): 2 via ORAL
  Filled 2018-02-22 (×3): qty 2
  Filled 2018-02-22: qty 1

## 2018-02-22 MED ORDER — SERTRALINE HCL 50 MG PO TABS
100.0000 mg | ORAL_TABLET | Freq: Every day | ORAL | Status: DC
  Administered 2018-02-23 – 2018-02-24 (×2): 100 mg via ORAL
  Filled 2018-02-22 (×2): qty 2

## 2018-02-22 NOTE — ED Triage Notes (Signed)
FIRST NURSE NOTE-recent angioplasty.  Blood flow has stopped to right great toe. Toe is black per pt.  Worse since procedure.

## 2018-02-22 NOTE — Progress Notes (Signed)
Pharmacy Antibiotic Note  Brian [name] is a 58 y.o. male admitted on 02/22/2018 with cellulitis.  Pharmacy has been consulted for ampicillin-sulbactam dosing.  Plan: Will start ampicillin-sulbactam 3 g q12H.   Height: 5\' 10"  (177.8 cm) Weight: 278 lb (126.1 kg) IBW/kg (Calculated) : 73  Temp (24hrs), Avg:98.8 F (37.1 C), Min:98.8 F (37.1 C), Max:98.8 F (37.1 C)  Recent Labs  Lab 02/22/18 1758  WBC 16.8*  CREATININE 4.91*    Estimated Creatinine Clearance: 21.8 mL/min (A) (by C-G formula based on SCr of 4.91 mg/dL (H)).    Allergies  Allergen Reactions  . Naproxen Rash    Antimicrobials this admission: 1/1 ampicillin/sulbactam >>   Dose adjustments this admission: Amp/sulb frequency adjusted due to renal function   Microbiology results: None this admission   Thank you for allowing pharmacy to be a part of this patient's care.  Oswald Hillock, PharmD, BCPS Clinical Pharmacist 02/22/2018 8:14 PM

## 2018-02-22 NOTE — ED Notes (Signed)
This RN attempted to stick pt x2 without success

## 2018-02-22 NOTE — H&P (Signed)
[facility] [place] NAME: Brian [name]    MR#:  [id number]  DATE OF BIRTH:  03-02-1960  DATE OF ADMISSION:  02/22/2018  PRIMARY CARE PHYSICIAN: Martin Majestic, FNP   REQUESTING/REFERRING PHYSICIAN: Dr. Cinda Quest  CHIEF COMPLAINT:   Chief Complaint  Patient presents with  . Toe Pain    HISTORY OF PRESENT ILLNESS:  Brian [name]  is a 58 y.o. male with a known history listed below presented to emergency room for evaluation of black right toe.  Patient has right toe ulceration for last few weeks and underwent right lower extremity angioplasty 1 week ago.  Last 3 to 4 days he has noticed right toe is turning black with some area of bleeding.  He does not see any pus drainage.  He denies fever or chills.  He denies any pain or swelling.  He underwent dialysis today.  Dialysis nurse evaluated and discuss with nephrologist on-call who recommended patient to come to emergency room for further evaluation.  In emergency room patient underwent routine labs and x-ray of the toe.  Lab suggestive of elevated WBC.  Patient started on IV antibiotic.  ER physician has discussed case with vascular surgery, podiatry who recommended amputation.  Hospitalist team requested for admission.  PAST MEDICAL HISTORY:   Past Medical History:  Diagnosis Date  . CHF (congestive heart failure) ([place])   . COPD (chronic obstructive pulmonary disease) ([place])   . Diabetes mellitus type 2 in obese ([place])   . DM (diabetes mellitus) type II controlled with renal manifestation ([place])   . Hypercholesteremia   . Hypertension   . Morbid obesity with BMI of 45.0-49.9, adult ([place])   . Myocardial infarction ([place])    2018 after infection of dialysis catheter  . Neuropathy   . Pancreatitis, acute   . Pneumonia   . Sleep apnea    CPAP  . Spinal stenosis     PAST SURGICAL HISTORY:   Past Surgical History:  Procedure Laterality Date  . AMPUTATION    . AV FISTULA PLACEMENT  Left 04/21/2017   Procedure: ARTERIOVENOUS (AV) FISTULA CREATION ( BRACHIOCEPHALIC );  Surgeon: Algernon Huxley, MD;  Location: [facility] ORS;  Service: Vascular;  Laterality: Left;  . CHOLECYSTECTOMY  1998  . DIALYSIS/PERMA CATHETER INSERTION N/A 01/03/2017   Procedure: DIALYSIS/PERMA CATHETER INSERTION;  Surgeon: Algernon Huxley, MD;  Location: [facility];  Service: Cardiovascular;  Laterality: N/A;  . DIALYSIS/PERMA CATHETER INSERTION N/A 01/31/2017   Procedure: DIALYSIS/PERMA CATHETER INSERTION;  Surgeon: Algernon Huxley, MD;  Location: [place] [facility];  Service: Cardiovascular;  Laterality: N/A;  . DIALYSIS/PERMA CATHETER REMOVAL N/A 01/24/2017   Procedure: DIALYSIS/PERMA CATHETER REMOVAL;  Surgeon: Algernon Huxley, MD;  Location: [place] [facility];  Service: Cardiovascular;  Laterality: N/A;  . DIALYSIS/PERMA CATHETER REMOVAL N/A 08/02/2017   Procedure: DIALYSIS/PERMA CATHETER REMOVAL;  Surgeon: Katha Cabal, MD;  Location: [facility];  Service: Cardiovascular;  Laterality: N/A;  . HERNIA REPAIR     2017 when appendix removed  . LAPAROSCOPIC APPENDECTOMY N/A 01/06/2015   Procedure: APPENDECTOMY LAPAROSCOPIC drainage of peritoneal abcess;  Surgeon: Sherri Rad, MD;  Location: [facility];  Service: General;  Laterality: N/A;  . LOWER EXTREMITY ANGIOGRAPHY Right 02/13/2018   Procedure: LOWER EXTREMITY ANGIOGRAPHY;  Surgeon: Algernon Huxley, MD;  Location: [facility] [place] [facility];  Service: Cardiovascular;  Laterality: Right;  . TEE WITHOUT CARDIOVERSION N/A 01/28/2017   Procedure: TRANSESOPHAGEAL  ECHOCARDIOGRAM (TEE);  Surgeon: Corey Skains, MD;  Location: [facility];  Service: Cardiovascular;  Laterality: N/A;    SOCIAL HISTORY:   Social History   Tobacco Use  . Smoking status: Current Every Day Smoker    Packs/day: 0.50    Types: Cigarettes  . Smokeless tobacco: Never Used  Substance Use Topics  . Alcohol use: No    FAMILY HISTORY:   Family History  Problem Relation  Age of Onset  . Hypertension Mother   . Hyperlipidemia Mother   . Heart disease Father   . Heart disease Maternal Grandfather     DRUG ALLERGIES:   Allergies  Allergen Reactions  . Naproxen Rash    REVIEW OF SYSTEMS:   ROS -12 point review of system reviewed positive as per HPI otherwise negative  MEDICATIONS AT HOME:   Prior to Admission medications   Medication Sig Start Date End Date Taking? Authorizing Provider  albuterol (PROVENTIL HFA;VENTOLIN HFA) 108 (90 Base) MCG/ACT inhaler Inhale 2 puffs into the lungs every 4 (four) hours as needed for wheezing or shortness of breath.    Yes [provider]  aspirin EC 81 MG EC tablet Take 1 tablet (81 mg total) by mouth daily. 02/29/16  Yes [name], MD  atorvastatin (LIPITOR) 80 MG tablet Take 80 mg daily by mouth.   Yes [provider]  carvedilol (COREG) 3.125 MG tablet Take 1 tablet (3.125 mg total) by mouth 2 (two) times daily with a meal. 02/11/17  Yes Mody, Sital, MD  cephALEXin (KEFLEX) 500 MG capsule Take 500 mg by mouth 3 (three) times daily.   Yes [provider]  clopidogrel (PLAVIX) 75 MG tablet Take 1 tablet (75 mg total) by mouth daily. 02/13/18  Yes Dew, Erskine Squibb, MD  furosemide (LASIX) 40 MG tablet Take 40 mg by mouth 2 (two) times daily. Take 40 mg by mouth daily on non dialysis days Tuesday, Thursday, Saturday and Sunday   Yes [provider]  gabapentin (NEURONTIN) 300 MG capsule TAKE 1 CAPSULE BY MOUTH ONCE DAILY AT NIGHT 01/15/18  Yes [provider]  ibuprofen (ADVIL,MOTRIN) 200 MG tablet Take 400 mg by mouth daily as needed for headache or moderate pain.    Yes [provider]  levothyroxine (SYNTHROID, LEVOTHROID) 50 MCG tablet Take 50 mcg daily before breakfast by mouth.   Yes [provider]  losartan (COZAAR) 25 MG tablet Take 25 mg by mouth daily.   Yes [provider]  sertraline (ZOLOFT) 100 MG tablet Take 100 mg by mouth daily.   Yes  [provider]  sevelamer carbonate (RENVELA) 800 MG tablet Take 800 mg by mouth 3 (three) times daily with meals.   Yes [provider]      VITAL SIGNS:  Blood pressure 114/71, pulse 82, temperature 98.8 F (37.1 C), temperature source Oral, resp. rate 15, height 5\' 10"  (1.778 m), weight 126.1 kg, SpO2 98 %.  PHYSICAL EXAMINATION:  Physical Exam  GENERAL:  58 y.o.-year-old patient lying in the bed with no acute distress.  EYES: Pupils equal, round, reactive to light and accommodation. No scleral icterus. Extraocular muscles intact.  HEENT: Head atraumatic, normocephalic. Oropharynx and nasopharynx clear.  NECK:  Supple, no jugular venous distention. No thyroid enlargement, no tenderness.  LUNGS: Normal breath sounds bilaterally, no wheezing, rales,rhonchi or crepitation. No use of accessory muscles of respiration.  CARDIOVASCULAR: S1, S2 normal. No murmurs, rubs, or gallops.  ABDOMEN: Soft, nontender, nondistended. Bowel sounds present. No  organomegaly or mass.  EXTREMITIES: Right great toe black with dried blood noticed, Doppler pulses to dorsalis pedis and posterior tibialis done in emergency room and present. NEUROLOGIC: Cranial nerves II through XII are intact. Muscle strength 5/5 in all extremities. Sensation intact. Gait not checked.  PSYCHIATRIC: The patient is alert and oriented x 3.  SKIN: Warm, dry, as above  LABORATORY PANEL:   CBC Recent Labs  Lab 02/22/18 1758  WBC 16.8*  HGB 11.5*  HCT 36.0*  PLT 254   ------------------------------------------------------------------------------------------------------------------  Chemistries  Recent Labs  Lab 02/22/18 1758  NA 134*  K 2.8*  CL 96*  CO2 24  GLUCOSE 199*  BUN 45*  CREATININE 4.91*  CALCIUM 7.6*  AST 12*  ALT 8  ALKPHOS 83  BILITOT 0.5   ------------------------------------------------------------------------------------------------------------------  Cardiac Enzymes No  results for input(s): TROPONINI in the last 168 hours. ------------------------------------------------------------------------------------------------------------------  RADIOLOGY:  Dg Foot Complete Right  Result Date: 02/22/2018 CLINICAL DATA:  Recent angioplasty.  Cellulitis of the great toe. EXAM: RIGHT FOOT COMPLETE - 3+ VIEW COMPARISON:  None. FINDINGS: There is no evidence of fracture or dislocation. There is plantar calcaneal spur. There is no osteopenia or bony destruction to suggest osteomyelitis. IMPRESSION: No evidence of osteomyelitis of the great toe. Electronically Signed   By: Abelardo Diesel M.D.   On: 02/22/2018 19:40      IMPRESSION AND PLAN:   1.  Right toe gangrene: Patient started on IV antibiotic.  Podiatry and vascular surgery notified.  Plan for amputation.  Patient with recent angioplasty.  Continue Plavix.  Keep patient n.p.o. after midnight.  Further treatment based on clinical course and specialist evaluation.  2.  History of CHF: Currently compensated.  Diastolic in nature.  Continue home medications.  3. End-stage renal disease on hemodialysis: Nephrology consult requested.  He is on Monday, Wednesday, Friday schedule.  Follow-up nephrology recommendation.  Continue home medications.   Patient has hypokalemia.  40 mEq of potassium ordered.  Repeat labs in the morning.  4. Essential hypertension:  Monitor.  Continue home medication as ordered.  5. HLD; Continue Atorvastatin  6. Diabetes: Continue SSI.   7. Hypothyroid: Continue Synthroid  DVT prophylaxis: Heparin subcutaneous  Estimated length of stay more than 2 midnight  Moderate to high risk secondary to above  All the records are reviewed and case discussed with ED provider. Management plans discussed with the patient, family and they are in agreement.  CODE STATUS: Full  TOTAL TIME TAKING CARE OF THIS PATIENT: 50 minutes.    Sedalia Muta M.D on 02/22/2018 at 9:14 PM  Between 7am to 6pm  - Pager - [phone]  After 6pm go to [email or web address] - [employer] [place] [facility]  Office  [phone]  CC: Primary care physician; Martin Majestic, FNP

## 2018-02-22 NOTE — ED Provider Notes (Signed)
[facility] Emergency Department Provider Note   ____________________________________________   First MD Initiated Contact with Patient 02/22/18 1855     (approximate)  I have reviewed the triage vital signs and the nursing notes.   HISTORY  Chief Complaint Toe Pain    HPI Brian [name] is a 58 y.o. male who reports he had angioplasty on his right leg on the 23rd of last month.  In the last few days his right great toe has gotten black.  It is hurting.  He is not running a fever.  There is some redness on the dorsum of the foot.  He also has diabetic neuropathy which is actually most of the pain he is having in his foot.   Past Medical History:  Diagnosis Date  . CHF (congestive heart failure) ([place])   . COPD (chronic obstructive pulmonary disease) ([place])   . Diabetes mellitus type 2 in obese ([place])   . DM (diabetes mellitus) type II controlled with renal manifestation ([place])   . Hypercholesteremia   . Hypertension   . Morbid obesity with BMI of 45.0-49.9, adult ([place])   . Myocardial infarction ([place])    2018 after infection of dialysis catheter  . Neuropathy   . Pancreatitis, acute   . Pneumonia   . Sleep apnea    CPAP  . Spinal stenosis     Patient Active Problem List   Diagnosis Date Noted  . Atherosclerosis of native arteries of the extremities with ulceration ([place]) 02/09/2018  . Osteoarthritis of hip (Bilateral) 11/16/2017  . Opioid use 11/16/2017  . Elevated sed rate 11/15/2017  . CKD (chronic kidney disease) stage 5, GFR less than 15 ml/min (H[facility]) 11/15/2017  . Elevated hemoglobin A1c 11/15/2017  . Lumbar Dextroscoliosis 10/31/2017  . Lumbar facet hypertrophy 10/31/2017  . Osteoarthritis of facet joint of lumbar spine 10/31/2017  . Lumbar facet syndrome (Bilateral) 10/31/2017  . Spondylosis without myelopathy or radiculopathy, lumbosacral region 10/31/2017  . DDD (degenerative disc disease), lumbosacral 10/31/2017  . Osteoarthritis of  knees (Bilateral) 10/31/2017  . Tricompartment osteoarthritis of knee (Left) 10/31/2017  . Chronic low back pain (Primary Area of Pain) (Bilateral) (R>L) 10/31/2017  . Osteoarthritis of  AC (acromioclavicular) joint (Left) 10/31/2017  . Class 3 obesity with alveolar hypoventilation without serious comorbidity with body mass index (BMI) of 40.0 to 44.9 in adult ([place]) 10/31/2017  . Chronic pain syndrome 10/31/2017  . Abnormal MRI, lumbar spine (06/26/2014) 10/31/2017  . 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 Lumbar central spinal stenosis (L2-3, L3-4, and L4-5) w/o neurogenic claudication 10/31/2017  . Lumbar foraminal stenosis (Multilevel) (Right: L3-4) 10/31/2017  . Other intervertebral disc degeneration, lumbar region 10/31/2017  . Neurogenic pain 10/31/2017  . Chronic musculoskeletal pain 10/31/2017  . Vitamin D insufficiency 10/31/2017  . Pharmacologic therapy 10/06/2017  . Disorder of skeletal system 10/06/2017  . Problems influencing health status 10/06/2017  . Chronic low back pain (Bilateral) (L>R) w/ sciatica (Bilateral) 10/06/2017  . Chronic hip pain [facility] Area of Pain) (Left) 10/06/2017  . Chronic lower extremity pain (Secondary Area of Pain) (Bilateral) (R>L) 10/06/2017  . Chronic knee pain (Fourth Area of Pain) (Bilateral) (L>R) 10/06/2017  . ESRD on dialysis ([place]) 02/09/2017  . Fluid overload 02/09/2017  . NSTEMI (non-ST elevated myocardial infarction) ([place]) 01/23/2017  . Acute on chronic diastolic CHF (congestive heart failure) ([place]) 12/29/2016  . Lower extremity lymphedema (Bilateral) 10/14/2016  . Obstructive sleep apnea on CPAP 09/13/2016  . Chronic diastolic heart failure (  [place]) 03/28/2016  . Tobacco use 03/28/2016  . Hyperkalemia 03/28/2016  . COPD (chronic obstructive pulmonary disease) ([place]) 02/08/2016  . DM (diabetes mellitus) type II controlled with renal manifestation ([place]) 01/05/2015  . Neuropathy ([place])   . Depression 01/17/2013  . HLD  (hyperlipidemia) 01/17/2013  . HTN (hypertension) 01/17/2013  . Severe obesity (BMI >= 40) ([place]) 01/17/2013  . Amputated toe ([place]) 01/17/2013  . Diabetes mellitus ([place]) 01/17/2013    Past Surgical History:  Procedure Laterality Date  . AMPUTATION    . AV FISTULA PLACEMENT Left 04/21/2017   Procedure: ARTERIOVENOUS (AV) FISTULA CREATION ( BRACHIOCEPHALIC );  Surgeon: Algernon Huxley, MD;  Location: [facility] ORS;  Service: Vascular;  Laterality: Left;  . CHOLECYSTECTOMY  1998  . DIALYSIS/PERMA CATHETER INSERTION N/A 01/03/2017   Procedure: DIALYSIS/PERMA CATHETER INSERTION;  Surgeon: Algernon Huxley, MD;  Location: [place] [facility];  Service: Cardiovascular;  Laterality: N/A;  . DIALYSIS/PERMA CATHETER INSERTION N/A 01/31/2017   Procedure: DIALYSIS/PERMA CATHETER INSERTION;  Surgeon: Algernon Huxley, MD;  Location: [place] [facility];  Service: Cardiovascular;  Laterality: N/A;  . DIALYSIS/PERMA CATHETER REMOVAL N/A 01/24/2017   Procedure: DIALYSIS/PERMA CATHETER REMOVAL;  Surgeon: Algernon Huxley, MD;  Location: [place] [facility];  Service: Cardiovascular;  Laterality: N/A;  . DIALYSIS/PERMA CATHETER REMOVAL N/A 08/02/2017   Procedure: DIALYSIS/PERMA CATHETER REMOVAL;  Surgeon: Katha Cabal, MD;  Location: [place] [facility];  Service: Cardiovascular;  Laterality: N/A;  . HERNIA REPAIR     2017 when appendix removed  . LAPAROSCOPIC APPENDECTOMY N/A 01/06/2015   Procedure: APPENDECTOMY LAPAROSCOPIC drainage of peritoneal abcess;  Surgeon: Sherri Rad, MD;  Location: [facility];  Service: General;  Laterality: N/A;  . LOWER EXTREMITY ANGIOGRAPHY Right 02/13/2018   Procedure: LOWER EXTREMITY ANGIOGRAPHY;  Surgeon: Algernon Huxley, MD;  Location: [place] [facility];  Service: Cardiovascular;  Laterality: Right;  . TEE WITHOUT CARDIOVERSION N/A 01/28/2017   Procedure: TRANSESOPHAGEAL ECHOCARDIOGRAM (TEE);  Surgeon: Corey Skains, MD;  Location: [facility];  Service: Cardiovascular;  Laterality: N/A;      Prior to Admission medications   Medication Sig Start Date End Date Taking? Authorizing Provider  albuterol (PROVENTIL HFA;VENTOLIN HFA) 108 (90 Base) MCG/ACT inhaler Inhale 2 puffs into the lungs every 4 (four) hours as needed for wheezing or shortness of breath.     [provider]  aspirin EC 81 MG EC tablet Take 1 tablet (81 mg total) by mouth daily. 02/29/16   [name], MD  atorvastatin (LIPITOR) 80 MG tablet Take 80 mg daily by mouth.    [provider]  carvedilol (COREG) 3.125 MG tablet Take 1 tablet (3.125 mg total) by mouth 2 (two) times daily with a meal. 02/11/17   Bettey Costa, MD  clopidogrel (PLAVIX) 75 MG tablet Take 1 tablet (75 mg total) by mouth daily. 02/13/18   Algernon Huxley, MD  DULoxetine (CYMBALTA) 60 MG capsule Take 60 mg by mouth daily.    [provider]  fluticasone (FLOVENT HFA) 220 MCG/ACT inhaler Inhale 2 puffs into the lungs 2 (two) times daily.    [provider]  furosemide (LASIX) 40 MG tablet Take 40 mg by mouth 2 (two) times daily. Take 40 mg by mouth daily on non dialysis days Tuesday, Thursday, Saturday and Sunday    [provider]  gabapentin (NEURONTIN) 300 MG capsule TAKE 1 CAPSULE BY MOUTH ONCE DAILY AT NIGHT 01/15/18   [provider]  ibuprofen (ADVIL,MOTRIN) 200 MG  tablet Take 400 mg by mouth daily as needed for headache or moderate pain.     [provider]  levothyroxine (SYNTHROID, LEVOTHROID) 50 MCG tablet Take 50 mcg daily before breakfast by mouth.    [provider]  lidocaine-prilocaine (EMLA) cream Apply 1 application topically as needed (for port access).  05/17/17   [provider]  losartan (COZAAR) 25 MG tablet Take 25 mg by mouth daily.    [provider]  sertraline (ZOLOFT) 100 MG tablet Take 100 mg by mouth daily.    [provider]  sevelamer carbonate (RENVELA) 800 MG tablet Take 800 mg by mouth 3 (three) times daily with meals.     [provider]    Allergies Naproxen  Family History  Problem Relation Age of Onset  . Hypertension Mother   . Hyperlipidemia Mother   . Heart disease Father   . Heart disease Maternal Grandfather     Social History Social History   Tobacco Use  . Smoking status: Current Every Day Smoker    Packs/day: 0.50    Types: Cigarettes  . Smokeless tobacco: Never Used  Substance Use Topics  . Alcohol use: No  . Drug use: No    Review of Systems  Constitutional: No fever/chills Eyes: No visual changes. ENT: No sore throat. Cardiovascular: Denies chest pain. Respiratory: Denies shortness of breath. Gastrointestinal: No abdominal pain.  No nausea, no vomiting.  No diarrhea.  No constipation. Genitourinary: Negative for dysuria. Musculoskeletal: Negative for back pain. Skin: Negative for rash. Neurological: Negative for headaches, focal weakness  ____________________________________________   PHYSICAL EXAM:  VITAL SIGNS: ED Triage Vitals [02/22/18 1751]  Enc Vitals Group     BP 114/71     Pulse Rate 82     Resp 15     Temp 98.8 F (37.1 C)     Temp Source Oral     SpO2 98 %     Weight 278 lb (126.1 kg)     Height 5\' 10"  (1.778 m)     Head Circumference      Peak Flow      Pain Score 7     Pain Loc      Pain Edu?      Excl. in [place]?     Constitutional: Alert and oriented. Well appearing and in no acute distress. Eyes: Conjunctivae are normal. Head: Atraumatic. Nose: No congestion/rhinnorhea. Mouth/Throat: Mucous membranes are moist.  Oropharynx non-erythematous. Neck: No stridor Cardiovascular: Normal rate, regular rhythm. Grossly normal heart sounds.  Good peripheral circulation. Respiratory: Normal respiratory effort.  No retractions. Lungs CTAB. Gastrointestinal: Soft and nontender. No distention. No abdominal bruits. No CVA tenderness. Musculoskeletal: No edema.  Left great toe is missing.  Right great toe is black there is some erythema on the  dorsum of the foot to about the midfoot.  The plantar surface of the foot blackness extends from the toe into the and TP area dorsalis pedis pulse with Doppler is not bounding but good seen with a posterior tibial pulse.  Posterior tibial pulse on the other foot is also good.  I did not check the dorsalis pedis on the other foot. Neurologic:  Normal speech and language. No gross focal neurologic deficits are appreciated. Skin:  Skin is warm, dry and intact. No rash noted. Psychiatric: Mood and affect are normal. Speech and behavior are normal.  ____________________________________________   LABS (all labs ordered are listed, but only abnormal results are displayed)  Labs Reviewed  CBC WITH DIFFERENTIAL/PLATELET - Abnormal; Notable for the following components:      Result Value   WBC 16.8 (*)    RBC 3.65 (*)    Hemoglobin 11.5 (*)    HCT 36.0 (*)    RDW 15.7 (*)    Neutro Abs 15.0 (*)    Abs Immature Granulocytes 0.12 (*)    All other components within normal limits  COMPREHENSIVE METABOLIC PANEL - Abnormal; Notable for the following components:   Sodium 134 (*)    Potassium 2.8 (*)    Chloride 96 (*)    Glucose, Bld 199 (*)    BUN 45 (*)    Creatinine, Ser 4.91 (*)    Calcium 7.6 (*)    Albumin 3.3 (*)    AST 12 (*)    GFR calc non Af Amer 12 (*)    GFR calc Af Amer 14 (*)    All other components within normal limits   ____________________________________________  EKG  ________________________________________  RADIOLOGY  ED MD interpretation: X-rays of the foot interpreted by me look essentially normal awaiting for radiologist report. Official radiology report(s): Dg Foot Complete Right  Result Date: 02/22/2018 CLINICAL DATA:  Recent angioplasty.  Cellulitis of the great toe. EXAM: RIGHT FOOT COMPLETE - 3+ VIEW COMPARISON:  None. FINDINGS: There is no evidence of fracture or dislocation. There is plantar calcaneal spur. There is no osteopenia or bony destruction to  suggest osteomyelitis. IMPRESSION: No evidence of osteomyelitis of the great toe. Electronically Signed   By: Abelardo Diesel M.D.   On: 02/22/2018 19:40    ____________________________________________   PROCEDURES  Procedure(s) performed:   Procedures  Critical Care performed:   ____________________________________________   INITIAL IMPRESSION / ASSESSMENT AND PLAN / ED COURSE  Gust patient with Dr. Trula Slade who is on-call for vascular.  He reviewed the patient's old records and feels that the blood flow to the toe up to return or why should say is is good as it could be.  Recommends Dr. Elvina Mattes or other podiatrist to amputate the toe.  We are now waiting for Dr. Elvina Mattes to call back.  I have just have him page.   Dr. Elvina Mattes agrees patient should come in the hospital get antibiotics have given chart to the hospitalist  ____________________________________________   FINAL CLINICAL IMPRESSION(S) / ED DIAGNOSES  Final diagnoses:  Pain of foot, unspecified laterality  Actual diagnosis is necrotic great toe this is unavailable in the computer apparently  ED Discharge Orders    None       Note:  This document was prepared using Dragon voice recognition software and may include unintentional dictation errors.    Nena Polio, MD 02/22/18 2006

## 2018-02-22 NOTE — ED Triage Notes (Signed)
Pt c/o right great toe pain - he reports the toe is black in color - pt had angioplasty 12/23/placed on Plavix  and was told that there was great blood flow to the foot - today the dialysis nurse checked today and reports that the right toe is cool to touch with decreased pulses in that foot

## 2018-02-23 ENCOUNTER — Inpatient Hospital Stay: Payer: Medicare Other | Admitting: Anesthesiology

## 2018-02-23 ENCOUNTER — Encounter: Payer: Self-pay | Admitting: *Deleted

## 2018-02-23 ENCOUNTER — Encounter
Admission: EM | Disposition: A | Discharge status: Home / Self Care | Payer: Self-pay | Source: Home / Self Care | Attending: Internal Medicine

## 2018-02-23 DIAGNOSIS — I70261 Atherosclerosis of native arteries of extremities with gangrene, right leg: Secondary | ICD-10-CM

## 2018-02-23 DIAGNOSIS — I7092 Chronic total occlusion of artery of the extremities: Secondary | ICD-10-CM

## 2018-02-23 HISTORY — PX: AMPUTATION TOE: SHX6595

## 2018-02-23 HISTORY — PX: LOWER EXTREMITY ANGIOGRAPHY: CATH118251

## 2018-02-23 LAB — CBC
HCT: 32.3 % — ABNORMAL LOW (ref 39.0–52.0)
Hemoglobin: 10.2 g/dL — ABNORMAL LOW (ref 13.0–17.0)
MCH: 31.3 pg (ref 26.0–34.0)
MCHC: 31.6 g/dL (ref 30.0–36.0)
MCV: 99.1 fL (ref 80.0–100.0)
Platelets: 219 10*3/uL (ref 150–400)
RBC: 3.26 MIL/uL — ABNORMAL LOW (ref 4.22–5.81)
RDW: 15.7 % — ABNORMAL HIGH (ref 11.5–15.5)
WBC: 13.9 10*3/uL — ABNORMAL HIGH (ref 4.0–10.5)
nRBC: 0 % (ref 0.0–0.2)

## 2018-02-23 LAB — PROTIME-INR
INR: 1.11
PROTHROMBIN TIME: 14.2 s (ref 11.4–15.2)

## 2018-02-23 LAB — BASIC METABOLIC PANEL
Anion gap: 14 (ref 5–15)
BUN: 51 mg/dL — ABNORMAL HIGH (ref 6–20)
CO2: 23 mmol/L (ref 22–32)
Calcium: 7.2 mg/dL — ABNORMAL LOW (ref 8.9–10.3)
Chloride: 98 mmol/L (ref 98–111)
Creatinine, Ser: 5.64 mg/dL — ABNORMAL HIGH (ref 0.61–1.24)
GFR, EST AFRICAN AMERICAN: 12 mL/min — AB (ref >60)
GFR, EST NON AFRICAN AMERICAN: 10 mL/min — AB (ref >60)
Glucose, Bld: 121 mg/dL — ABNORMAL HIGH (ref 70–99)
Potassium: 3.2 mmol/L — ABNORMAL LOW (ref 3.5–5.1)
Sodium: 135 mmol/L (ref 135–145)

## 2018-02-23 LAB — APTT: aPTT: 41 seconds — ABNORMAL HIGH (ref 24–36)

## 2018-02-23 LAB — GLUCOSE, CAPILLARY
GLUCOSE-CAPILLARY: 79 mg/dL (ref 70–99)
Glucose-Capillary: 112 mg/dL — ABNORMAL HIGH (ref 70–99)
Glucose-Capillary: 111 mg/dL — ABNORMAL HIGH (ref 70–99)
Glucose-Capillary: 108 mg/dL — ABNORMAL HIGH (ref 70–99)
Glucose-Capillary: 119 mg/dL — ABNORMAL HIGH (ref 70–99)
Glucose-Capillary: 120 mg/dL — ABNORMAL HIGH (ref 70–99)
Glucose-Capillary: 125 mg/dL — ABNORMAL HIGH (ref 70–99)

## 2018-02-23 LAB — MAGNESIUM: Magnesium: 1.8 mg/dL (ref 1.7–2.4)

## 2018-02-23 LAB — MRSA PCR SCREENING: MRSA by PCR: NEGATIVE

## 2018-02-23 SURGERY — AMPUTATION, TOE
Anesthesia: General | Site: Foot | Laterality: Right

## 2018-02-23 SURGERY — LOWER EXTREMITY ANGIOGRAPHY
Anesthesia: Moderate Sedation | Laterality: Right

## 2018-02-23 MED ORDER — PHENYLEPHRINE HCL 10 MG/ML IJ SOLN
INTRAMUSCULAR | Status: DC | PRN
  Administered 2018-02-23: 100 ug via INTRAVENOUS

## 2018-02-23 MED ORDER — SODIUM CHLORIDE 0.9 % IV BOLUS
500.0000 mL | Freq: Once | INTRAVENOUS | Status: AC
  Administered 2018-02-23: 500 mL via INTRAVENOUS

## 2018-02-23 MED ORDER — MIDODRINE HCL 5 MG PO TABS: 5.0000 mg | ORAL_TABLET | Freq: Three times a day (TID) | ORAL | Status: DC

## 2018-02-23 MED ORDER — LIDOCAINE-EPINEPHRINE (PF) 1 %-1:200000 IJ SOLN
INTRAMUSCULAR | Status: AC
  Filled 2018-02-23: qty 10

## 2018-02-23 MED ORDER — CALCIUM ACETATE (PHOS BINDER) 667 MG PO CAPS
2001.0000 mg | ORAL_CAPSULE | Freq: Three times a day (TID) | ORAL | Status: DC
  Administered 2018-02-23 – 2018-02-24 (×3): 2001 mg via ORAL
  Filled 2018-02-23 (×4): qty 3

## 2018-02-23 MED ORDER — GELATIN ABSORBABLE 12-7 MM EX MISC
CUTANEOUS | Status: AC
  Filled 2018-02-23: qty 1

## 2018-02-23 MED ORDER — MIDAZOLAM HCL 2 MG/2ML IJ SOLN
INTRAMUSCULAR | Status: DC | PRN
  Administered 2018-02-23: 2 mg via INTRAVENOUS

## 2018-02-23 MED ORDER — OXYCODONE HCL 5 MG PO TABS: 5.0000 mg | ORAL_TABLET | Freq: Once | ORAL | Status: DC | PRN

## 2018-02-23 MED ORDER — HEPARIN SODIUM (PORCINE) 1000 UNIT/ML IJ SOLN
INTRAMUSCULAR | Status: AC
  Filled 2018-02-23: qty 1

## 2018-02-23 MED ORDER — POTASSIUM CHLORIDE CRYS ER 20 MEQ PO TBCR: 40.0000 meq | EXTENDED_RELEASE_TABLET | ORAL | Status: DC

## 2018-02-23 MED ORDER — MEPERIDINE HCL 50 MG/ML IJ SOLN: 6.2500 mg | INTRAMUSCULAR | Status: DC | PRN

## 2018-02-23 MED ORDER — MIDODRINE HCL 5 MG PO TABS
5.0000 mg | ORAL_TABLET | Freq: Three times a day (TID) | ORAL | Status: DC
  Administered 2018-02-23 – 2018-02-24 (×4): 5 mg via ORAL
  Filled 2018-02-23 (×5): qty 1

## 2018-02-23 MED ORDER — SODIUM CHLORIDE FLUSH 0.9 % IV SOLN
INTRAVENOUS | Status: AC
  Filled 2018-02-23: qty 20

## 2018-02-23 MED ORDER — PROPOFOL 10 MG/ML IV BOLUS
INTRAVENOUS | Status: DC | PRN
  Administered 2018-02-23: 20 mg via INTRAVENOUS

## 2018-02-23 MED ORDER — FENTANYL CITRATE (PF) 100 MCG/2ML IJ SOLN
INTRAMUSCULAR | Status: DC | PRN
  Administered 2018-02-23: 50 ug via INTRAVENOUS

## 2018-02-23 MED ORDER — MIDAZOLAM HCL 5 MG/5ML IJ SOLN
INTRAMUSCULAR | Status: AC
  Filled 2018-02-23: qty 5

## 2018-02-23 MED ORDER — VANCOMYCIN HCL 10 G IV SOLR
1500.0000 mg | Freq: Once | INTRAVENOUS | Status: AC
  Administered 2018-02-23: 1500 mg via INTRAVENOUS
  Filled 2018-02-23: qty 1500

## 2018-02-23 MED ORDER — SODIUM CHLORIDE 0.9 % IV SOLN
INTRAVENOUS | Status: DC
  Administered 2018-02-23: 12:00:00 via INTRAVENOUS

## 2018-02-23 MED ORDER — POTASSIUM CHLORIDE CRYS ER 20 MEQ PO TBCR
20.0000 meq | EXTENDED_RELEASE_TABLET | Freq: Once | ORAL | Status: AC
  Administered 2018-02-23: 20 meq via ORAL
  Filled 2018-02-23: qty 1

## 2018-02-23 MED ORDER — FENTANYL CITRATE (PF) 100 MCG/2ML IJ SOLN
INTRAMUSCULAR | Status: DC | PRN
  Administered 2018-02-23 (×2): 50 ug via INTRAVENOUS

## 2018-02-23 MED ORDER — BUPIVACAINE HCL 0.5 % IJ SOLN
INTRAMUSCULAR | Status: DC | PRN
  Administered 2018-02-23: 10 mL

## 2018-02-23 MED ORDER — FENTANYL CITRATE (PF) 100 MCG/2ML IJ SOLN
INTRAMUSCULAR | Status: AC
  Filled 2018-02-23: qty 2

## 2018-02-23 MED ORDER — PROMETHAZINE HCL 25 MG/ML IJ SOLN: 6.2500 mg | INTRAMUSCULAR | Status: DC | PRN

## 2018-02-23 MED ORDER — HEPARIN (PORCINE) IN NACL 1000-0.9 UT/500ML-% IV SOLN
INTRAVENOUS | Status: AC
  Filled 2018-02-23: qty 1000

## 2018-02-23 MED ORDER — SODIUM CHLORIDE 0.9 % IV SOLN
1.0000 g | INTRAVENOUS | Status: DC
  Administered 2018-02-23 – 2018-02-24 (×2): 1 g via INTRAVENOUS
  Filled 2018-02-23 (×2): qty 1

## 2018-02-23 MED ORDER — OXYCODONE HCL 5 MG/5ML PO SOLN: 5.0000 mg | Freq: Once | ORAL | Status: DC | PRN

## 2018-02-23 MED ORDER — VANCOMYCIN HCL IN DEXTROSE 1-5 GM/200ML-% IV SOLN
1000.0000 mg | INTRAVENOUS | Status: DC
  Filled 2018-02-23: qty 200

## 2018-02-23 MED ORDER — SODIUM CHLORIDE 0.9 % IV SOLN
INTRAVENOUS | Status: DC | PRN
  Administered 2018-02-23: 250 mL via INTRAVENOUS

## 2018-02-23 MED ORDER — PROPOFOL 500 MG/50ML IV EMUL
INTRAVENOUS | Status: DC | PRN
  Administered 2018-02-23: 50 ug/kg/min via INTRAVENOUS

## 2018-02-23 MED ORDER — FENTANYL CITRATE (PF) 100 MCG/2ML IJ SOLN: 25.0000 ug | INTRAMUSCULAR | Status: DC | PRN

## 2018-02-23 MED ORDER — SEVELAMER CARBONATE 800 MG PO TABS
2400.0000 mg | ORAL_TABLET | Freq: Three times a day (TID) | ORAL | Status: DC
  Administered 2018-02-23 – 2018-02-24 (×3): 2400 mg via ORAL
  Filled 2018-02-23 (×4): qty 3

## 2018-02-23 MED ORDER — BUPIVACAINE HCL (PF) 0.5 % IJ SOLN
INTRAMUSCULAR | Status: AC
  Filled 2018-02-23: qty 30

## 2018-02-23 MED ORDER — MIDAZOLAM HCL 2 MG/2ML IJ SOLN
INTRAMUSCULAR | Status: AC
  Filled 2018-02-23: qty 2

## 2018-02-23 MED ORDER — IOPAMIDOL (ISOVUE-300) INJECTION 61%
INTRAVENOUS | Status: DC | PRN
  Administered 2018-02-23: 35 mL via INTRA_ARTERIAL

## 2018-02-23 SURGICAL SUPPLY — 44 items
"PENCIL ELECTRO HAND CTR " (MISCELLANEOUS) ×1 IMPLANT
BANDAGE ACE 4X5 VEL STRL LF (GAUZE/BANDAGES/DRESSINGS) ×2 IMPLANT
BANDAGE ELASTIC 4 LF NS (GAUZE/BANDAGES/DRESSINGS) ×3 IMPLANT
BLADE MED AGGRESSIVE (BLADE) ×3 IMPLANT
BLADE SURG 15 STRL LF DISP TIS (BLADE) ×4 IMPLANT
BLADE SURG 15 STRL SS (BLADE) ×8
BNDG CONFORM 3 STRL LF (GAUZE/BANDAGES/DRESSINGS) ×3 IMPLANT
BNDG ESMARK 4X12 TAN STRL LF (GAUZE/BANDAGES/DRESSINGS) ×3 IMPLANT
BNDG GAUZE 4.5X4.1 6PLY STRL (MISCELLANEOUS) ×3 IMPLANT
BNDG STRETCH 4X75 STRL LF (GAUZE/BANDAGES/DRESSINGS) ×2 IMPLANT
CANISTER SUCT 1200ML W/VALVE (MISCELLANEOUS) ×3 IMPLANT
COVER WAND RF STERILE (DRAPES) ×3 IMPLANT
CUFF TOURN 18 STER (MISCELLANEOUS) ×3 IMPLANT
CUFF TOURN DUAL PL 12 NO SLV (MISCELLANEOUS) ×3 IMPLANT
DRAPE FLUOR MINI C-ARM 54X84 (DRAPES) ×3 IMPLANT
DURAPREP 26ML APPLICATOR (WOUND CARE) ×3 IMPLANT
ELECT REM PT RETURN 9FT ADLT (ELECTROSURGICAL) ×3
ELECTRODE REM PT RTRN 9FT ADLT (ELECTROSURGICAL) ×1 IMPLANT
GAUZE PETRO XEROFOAM 1X8 (MISCELLANEOUS) ×3 IMPLANT
GAUZE SPONGE 4X4 12PLY STRL (GAUZE/BANDAGES/DRESSINGS) ×3 IMPLANT
GAUZE XEROFORM 4X4 STRL (GAUZE/BANDAGES/DRESSINGS) ×2 IMPLANT
GLOVE BIO SURGEON STRL SZ8 (GLOVE) ×3 IMPLANT
GLOVE INDICATOR 8.0 STRL GRN (GLOVE) ×3 IMPLANT
GOWN STRL REUS W/ TWL LRG LVL3 (GOWN DISPOSABLE) ×2 IMPLANT
GOWN STRL REUS W/TWL LRG LVL3 (GOWN DISPOSABLE) ×4
KIT TURNOVER KIT A (KITS) ×3 IMPLANT
LABEL OR SOLS (LABEL) ×3 IMPLANT
NDL HYPO 25X1 1.5 SAFETY (NEEDLE) ×3 IMPLANT
NDL SAFETY ECLIPSE 18X1.5 (NEEDLE) ×1 IMPLANT
NEEDLE HYPO 18GX1.5 SHARP (NEEDLE) ×2
NEEDLE HYPO 25X1 1.5 SAFETY (NEEDLE) ×9 IMPLANT
NS IRRIG 500ML POUR BTL (IV SOLUTION) ×3 IMPLANT
PACK EXTREMITY ARMC (MISCELLANEOUS) ×3 IMPLANT
PAD ABD DERMACEA PRESS 5X9 (GAUZE/BANDAGES/DRESSINGS) ×4 IMPLANT
PENCIL ELECTRO HAND CTR (MISCELLANEOUS) ×3 IMPLANT
RASP SM TEAR CROSS CUT (RASP) ×3 IMPLANT
SPONGE SURGIFOAM ABS GEL 12-7 (HEMOSTASIS) ×2 IMPLANT
STOCKINETTE STRL 6IN 960660 (GAUZE/BANDAGES/DRESSINGS) ×3 IMPLANT
SUT ETH BLK MONO 3 0 FS 1 12/B (SUTURE) ×5 IMPLANT
SUT ETHILON 5 0 PS 2 18 (SUTURE) ×3 IMPLANT
SUT VIC AB 4-0 FS2 27 (SUTURE) ×3 IMPLANT
SUT VICRYL+ 3-0 36IN CT-1 (SUTURE) ×2 IMPLANT
SWAB CULTURE AMIES ANAERIB BLU (MISCELLANEOUS) ×3 IMPLANT
SYR 10ML LL (SYRINGE) ×3 IMPLANT

## 2018-02-23 SURGICAL SUPPLY — 7 items
CATH PIG 70CM (CATHETERS) ×2 IMPLANT
COVER PROBE U/S 5X48 (MISCELLANEOUS) ×2 IMPLANT
DEVICE STARCLOSE SE CLOSURE (Vascular Products) ×2 IMPLANT
PACK ANGIOGRAPHY (CUSTOM PROCEDURE TRAY) ×3 IMPLANT
SHEATH BRITE TIP 5FRX11 (SHEATH) ×2 IMPLANT
TUBING CONTRAST HIGH PRESS 72 (TUBING) ×2 IMPLANT
WIRE J 3MM .035X145CM (WIRE) ×2 IMPLANT

## 2018-02-23 NOTE — Anesthesia Preprocedure Evaluation (Signed)
Anesthesia Evaluation  Patient identified by MRN, date of birth, ID band Patient awake    Reviewed: Allergy & Precautions, NPO status , Patient's Chart, lab work & pertinent test results  History of Anesthesia Complications Negative for: history of anesthetic complications  Airway Mallampati: III  TM Distance: >3 FB Neck ROM: Full    Dental  (+) Poor Dentition, Chipped, Missing   Pulmonary sleep apnea , COPD,  COPD inhaler, Current Smoker,    breath sounds clear to auscultation- rhonchi (-) wheezing      Cardiovascular hypertension, Pt. on medications + Past MI, + Peripheral Vascular Disease and +CHF (diastolic)  (-) Cardiac Stents and (-) CABG  Rhythm:Regular Rate:Normal - Systolic murmurs and - Diastolic murmurs    Neuro/Psych neg Seizures PSYCHIATRIC DISORDERS Depression negative neurological ROS     GI/Hepatic negative GI ROS, Neg liver ROS,   Endo/Other  diabetes (diet controlled)  Renal/GU ESRF and DialysisRenal disease     Musculoskeletal  (+) Arthritis ,   Abdominal (+) + obese,   Peds  Hematology negative hematology ROS (+)   Anesthesia Other Findings Past Medical History: No date: CHF (congestive heart failure) (HCC) No date: COPD (chronic obstructive pulmonary disease) (HCC) No date: Diabetes mellitus type 2 in obese (HCC) No date: DM (diabetes mellitus) type II controlled with renal  manifestation (HCC) No date: Hypercholesteremia No date: Hypertension No date: Morbid obesity with BMI of 45.0-49.9, adult (HCC) No date: Myocardial infarction [facility])     Comment:  2018 after infection of dialysis catheter No date: Neuropathy No date: Pancreatitis, acute No date: Pneumonia No date: Sleep apnea     Comment:  CPAP No date: Spinal stenosis   Reproductive/Obstetrics                             Anesthesia Physical Anesthesia Plan  ASA: IV  Anesthesia Plan: General    Post-op Pain Management:    Induction: Intravenous  PONV Risk Score and Plan: 0 and Propofol infusion  Airway Management Planned: Natural Airway  Additional Equipment:   Intra-op Plan:   Post-operative Plan:   Informed Consent: I have reviewed the patients History and Physical, chart, labs and discussed the procedure including the risks, benefits and alternatives for the proposed anesthesia with the patient or authorized representative who has indicated his/her understanding and acceptance.   Dental advisory given  Plan Discussed with: CRNA and Anesthesiologist  Anesthesia Plan Comments:         Anesthesia Quick Evaluation

## 2018-02-23 NOTE — Progress Notes (Signed)
[facility]  ROUNDING NOTE   Subjective:   [name] admitted to [facility] on 02/22/2018 for Pain of foot, unspecified laterality [M79.673]  Found to have right first toe ischemia. Underwent amputation this morning by Dr. Elvina Mattes.   Patient now scheduled for angiogram of lower extremities.   Patient completed almost 3 hours of his dialysis treatment yesterday.   Objective:  Vital signs in last 24 hours:  Temp:  [97.4 F (36.3 C)-99.5 F (37.5 C)] 98.1 F (36.7 C) (01/02 1151) Pulse Rate:  [77-91] 78 (01/02 1151) Resp:  [0-24] 14 (01/02 1151) BP: (77-124)/(34-75) 91/56 (01/02 1151) SpO2:  [93 %-100 %] 95 % (01/02 1151) Weight:  [125.5 kg-126.6 kg] 125.5 kg (01/02 1151)  Weight change:  Filed Weights   02/23/18 0500 02/23/18 0830 02/23/18 1151  Weight: 125.5 kg 125.5 kg 125.5 kg    Intake/Output: I/O last 3 completed shifts: In: 65 [P.O.:120; IV Piggyback:600] Out: 0    Intake/Output this shift:  Total I/O In: 425 [I.V.:350; IV Piggyback:75] Out: 10 [Blood:10]  Physical Exam: General: NAD,   Head: Normocephalic, atraumatic. Moist oral mucosal membranes  Eyes: Anicteric, PERRL  Neck: Supple, trachea midline  Lungs:  Clear to auscultation  Heart: Regular rate and rhythm  Abdomen:  Soft, nontender,   Extremities:  no peripheral edema., right foot in dressings  Neurologic: Nonfocal, moving all four extremities  Skin: No lesions  Access: Left AVF    Basic Metabolic Panel: Recent Labs  Lab 02/22/18 1758 02/23/18 0436  NA 134* 135  K 2.8* 3.2*  CL 96* 98  CO2 24 23  GLUCOSE 199* 121*  BUN 45* 51*  CREATININE 4.91* 5.64*  CALCIUM 7.6* 7.2*  MG  --  1.8    Liver Function Tests: Recent Labs  Lab 02/22/18 1758  AST 12*  ALT 8  ALKPHOS 83  BILITOT 0.5  PROT 7.5  ALBUMIN 3.3*   No results for input(s): LIPASE, AMYLASE in the last 168 hours. No results for input(s): AMMONIA in the last 168 hours.  CBC: Recent Labs  Lab  02/22/18 1758 02/23/18 0436  WBC 16.8* 13.9*  NEUTROABS 15.0*  --   HGB 11.5* 10.2*  HCT 36.0* 32.3*  MCV 98.6 99.1  PLT 254 219    Cardiac Enzymes: No results for input(s): CKTOTAL, CKMB, CKMBINDEX, TROPONINI in the last 168 hours.  BNP: Invalid input(s): POCBNP  CBG: Recent Labs  Lab 02/22/18 2304 02/23/18 0758 02/23/18 0953 02/23/18 1129 02/23/18 1158  GLUCAP 182* 112* 111* 108* 119*    Microbiology: Results for orders placed or performed during the hospital encounter of 02/22/18  MRSA PCR Screening     Status: None   Collection Time: 02/22/18 10:58 PM  Result Value Ref Range Status   MRSA by PCR NEGATIVE NEGATIVE Final    Comment:        The GeneXpert MRSA Assay (FDA approved for NASAL specimens only), is one component of a comprehensive MRSA colonization surveillance program. It is not intended to diagnose MRSA infection nor to guide or monitor treatment for MRSA infections. Performed at [facility], [address] [place], [place] [address]     Coagulation Studies: Recent Labs    02/23/18 0436  LABPROT 14.2  INR 1.11    Urinalysis: No results for input(s): COLORURINE, LABSPEC, PHURINE, GLUCOSEU, HGBUR, BILIRUBINUR, KETONESUR, PROTEINUR, UROBILINOGEN, NITRITE, LEUKOCYTESUR in the last 72 hours.  Invalid input(s): APPERANCEUR    Imaging: Dg Foot Complete Right  Result Date:  02/22/2018 CLINICAL DATA:  Recent angioplasty.  Cellulitis of the great toe. EXAM: RIGHT FOOT COMPLETE - 3+ VIEW COMPARISON:  None. FINDINGS: There is no evidence of fracture or dislocation. There is plantar calcaneal spur. There is no osteopenia or bony destruction to suggest osteomyelitis. IMPRESSION: No evidence of osteomyelitis of the great toe. Electronically Signed   By: Abelardo Diesel M.D.   On: 02/22/2018 19:40     Medications:   . [MAR Hold] sodium chloride 250 mL (02/23/18 0752)  . sodium chloride 20 mL/hr at 02/23/18 1142  . [MAR Hold] cefTRIAXone  (ROCEPHIN)  IV 1 g (02/23/18 0754)  . [MAR Hold] vancomycin     . [MAR Hold] aspirin EC  81 mg Oral Daily  . [MAR Hold] atorvastatin  80 mg Oral Daily  . [MAR Hold] carvedilol  3.125 mg Oral BID WC  . [MAR Hold] clopidogrel  75 mg Oral Daily  . [MAR Hold] docusate sodium  100 mg Oral BID  . [MAR Hold] furosemide  40 mg Oral 2 times per day on Sun Tue Thu Sat  . [MAR Hold] gabapentin  300 mg Oral QHS  . [MAR Hold] heparin  5,000 Units Subcutaneous Q8H  . [MAR Hold] insulin aspart  0-15 Units Subcutaneous TID WC  . [MAR Hold] insulin aspart  0-5 Units Subcutaneous QHS  . [MAR Hold] levothyroxine  50 mcg Oral QAC breakfast  . [MAR Hold] losartan  25 mg Oral Daily  . [MAR Hold] potassium chloride  20 mEq Oral Once  . [MAR Hold] sertraline  100 mg Oral Daily  . [MAR Hold] sevelamer carbonate  800 mg Oral TID WC   [MAR Hold] sodium chloride, [MAR Hold] acetaminophen **OR** [MAR Hold] acetaminophen, [MAR Hold] albuterol, [MAR Hold] bisacodyl, [MAR Hold] HYDROcodone-acetaminophen, [MAR Hold] ondansetron **OR** [MAR Hold] ondansetron (ZOFRAN) IV  Assessment/ Plan:  [name] is a 58 y.o. white male with end stage renal disease on hemodialysis, hypertension, diabetes mellitus type II, coronary artery disease, chronic lower back pain, COPD, tobacco use, congestive heart failure, obstructive sleep apnea admitted to on 02/22/18 for right lower extremity ischemia  [facility] M[facility] Left AVF 122.5kg  1. End stage renal disease: completed most of his outpatient treatment yesterday. No indication for dialysis today. Plan on next treatment for tomorrow.   2. Hypertension: hypotensive during surgery this morning. Home regimen of furosemide, losartan and carvedilol.   3. Anemia of chronic kidney disease: hemoglobin 10.2.  - EPO on HD treatment  4. Secondary Hyperparathyroidism: with hypocalcemia and hyperphosphatemia. Outpatient PTH elevated at 612.  - calcium acetate and sevelamer     LOS: 1 Brian [name] 1/2/202012:38 PM

## 2018-02-23 NOTE — Progress Notes (Signed)
[facility] [place] NAME: Brian [name]    MR#:  [id number]  DATE OF BIRTH:  13-Jun-1960  SUBJECTIVE:  CHIEF COMPLAINT: Patient was seen after surgery, resting comfortably  REVIEW OF SYSTEMS:  CONSTITUTIONAL: No fever, fatigue or weakness.  EYES: No blurred or double vision.  EARS, NOSE, AND THROAT: No tinnitus or ear pain.  RESPIRATORY: No cough, shortness of breath, wheezing or hemoptysis.  CARDIOVASCULAR: No chest pain, orthopnea, edema.  GASTROINTESTINAL: No nausea, vomiting, diarrhea or abdominal pain.  MUSCULOSKELETAL: Denies any pain NEUROLOGIC: No tingling, numbness, weakness.  PSYCHIATRY: No anxiety or depression.   DRUG ALLERGIES:   Allergies  Allergen Reactions  . Naproxen Rash    VITALS:  Blood pressure 99/62, pulse 76, temperature 98.5 F (36.9 C), temperature source Oral, resp. rate 20, height 5\' 10"  (1.778 m), weight 125.5 kg, SpO2 100 %.  PHYSICAL EXAMINATION:  GENERAL:  58 y.o.-year-old patient lying in the bed with no acute distress.  EYES: Pupils equal, round, reactive to light and accommodation. No scleral icterus. Extraocular muscles intact.  HEENT: Head atraumatic, normocephalic. Oropharynx and nasopharynx clear.  NECK:  Supple, no jugular venous distention. No thyroid enlargement, no tenderness.  LUNGS: Normal breath sounds bilaterally, no wheezing, rales,rhonchi or crepitation. No use of accessory muscles of respiration.  CARDIOVASCULAR: S1, S2 normal. No murmurs, rubs, or gallops.  ABDOMEN: Soft, nontender, nondistended. Bowel sounds present.  EXTREMITIES: Status post right great toe amputation today, s/p  left great toe amputation no pedal edema, cyanosis, or clubbing.  NEUROLOGIC: awake , alert , oriented  Sensation intact. Gait not checked.  PSYCHIATRIC: The patient is alert and oriented x 3.  SKIN: No obvious rash, lesion, or ulcer.    LABORATORY PANEL:   CBC Recent Labs  Lab  02/23/18 0436  WBC 13.9*  HGB 10.2*  HCT 32.3*  PLT 219   ------------------------------------------------------------------------------------------------------------------  Chemistries  Recent Labs  Lab 02/22/18 1758 02/23/18 0436  NA 134* 135  K 2.8* 3.2*  CL 96* 98  CO2 24 23  GLUCOSE 199* 121*  BUN 45* 51*  CREATININE 4.91* 5.64*  CALCIUM 7.6* 7.2*  MG  --  1.8  AST 12*  --   ALT 8  --   ALKPHOS 83  --   BILITOT 0.5  --    ------------------------------------------------------------------------------------------------------------------  Cardiac Enzymes No results for input(s): TROPONINI in the last 168 hours. ------------------------------------------------------------------------------------------------------------------  RADIOLOGY:  Dg Foot Complete Right  Result Date: 02/22/2018 CLINICAL DATA:  Recent angioplasty.  Cellulitis of the great toe. EXAM: RIGHT FOOT COMPLETE - 3+ VIEW COMPARISON:  None. FINDINGS: There is no evidence of fracture or dislocation. There is plantar calcaneal spur. There is no osteopenia or bony destruction to suggest osteomyelitis. IMPRESSION: No evidence of osteomyelitis of the great toe. Electronically Signed   By: Abelardo Diesel M.D.   On: 02/22/2018 19:40    EKG:   Orders placed or performed during the hospital encounter of 04/14/17  . EKG test  . EKG test    ASSESSMENT AND PLAN:    1.  Right toe gangrene: Postop day #0 Continue IV antibiotics status post right great toe amputation today Patient had diagnostic angiogram by vascular   Patient with recent angioplasty. Resume Plavix.    Pain management as needed  2.  History of CHF: Currently compensated.  Diastolic in nature.  Continue home medications.  3. End-stage renal disease on hemodialysis: Nephrology consult requested.  He is on Monday,  Wednesday, Friday schedule.  Follow-up nephrology recommendation.  Continue home medications.  Patient has hypokalemia.  40 mEq of  potassium ordered.  Repeat labs in the morning.  4. Essential hypertension: Monitor.  Continue home medication as ordered.  5.  Hypokalemia replete and recheck in a.m.  6. Diabetes: Continue SSI.   7. Hypothyroid: Continue Synthroid  DVT prophylaxis: Heparin subcutaneous   All the records are reviewed and case discussed with Care Management/Social Workerr. Management plans discussed with the patient, family and they are in agreement.  CODE STATUS:   TOTAL TIME TAKING CARE OF THIS PATIENT: 36 minutes.   POSSIBLE D/C IN  DAYS, DEPENDING ON CLINICAL CONDITION.  Note: This dictation was prepared with Dragon dictation along with smaller phrase technology. Any transcriptional errors that result from this process are unintentional.   Nicholes Mango M.D on 02/23/2018 at 3:41 PM  Between 7am to 6pm - Pager - [phone] After 6pm go to [email or web address] - password [email or web address] [facility]  Office  [phone]  CC: Primary care physician; Martin Majestic, FNP

## 2018-02-23 NOTE — Op Note (Signed)
Operative note   Surgeon: Dr. Albertine Patricia, DPM.    Assistant: None    Preop diagnosis: Gangrene right great toe    Postop diagnosis: Same    Procedure:   1.  First ray amputation right foot          EBL: 30 cc    Anesthesia:IV sedation delivered by the anesthesia team.  Also delivered 10 cc 0.5% Marcaine plain at the first metatarsal base preoperatively    Hemostasis: None    Specimen: Gangrenous right great toe and also first metatarsal head with sesamoid apparatus    Complications: None    Operative indications: Gangrene to the right great toe which extended on the plantar surface to the metatarsal head and sesamoid apparatus area.    Procedure:  Patient was brought into the OR and placed on the operating table in thesupine position. After anesthesia was obtained theright lower extremity was prepped and draped in usual sterile fashion.  Operative Report: This time to direct to the right foot where 2 similar incisions were made over the first metatarsal distal shaft and head and extended on the medial lateral sides of the great toe trying to preserve the viable tissue and removing the necrotic tissue.  Incisions were carried down to bone level at this point and the first metatarsophalangeal joint was freed from soft tissue attachments and the toe was removed.  This point the metatarsal distal head shaft were dissected away from soft tissue and the midshaft portion of the metatarsal was resected distally.  Once the metatarsal was removed along with the sesamoid apparatus the area was copiously irrigated and bleeders were clamped and bovied as required.  Bleeders were clamped off and sutured as required also.  No arterial pulsatile flow was noted but considerable capillary bleeding was noted throughout the incision margin and I was fairly pleased with his level of blood flow at this point. Next, after copious irrigation the capsular and deep tissue was closed with 3-0 Vicryl  continuous stitch.  Skin was then closed at this point to utilizing 3-0 nylon simple interrupted and horizontal mattress combinations.  Patient had a larger defect on the distal plantar aspect this was able to be closed primarily. A little bit of tension on the tissues but I felt like there was reasonable color to that medial flap.  This time a sterile dressing was placed across the area with care not to compress too much.  This consisted of Xeroform gauze 4 x 4's conformer and Kerlix.  An Ace wrap was lightly placed on the outside of the dressing to maintain position.    Patient tolerated the procedure and anesthesia well.  Was transported from the OR to the PACU with all vital signs stable and vascular status intact. To be discharged per routine protocol.  Will follow up in approximately 1 week in the outpatient clinic.

## 2018-02-23 NOTE — Progress Notes (Signed)
Call Dr. Andree Elk (anesthesiologist) to ask about pt's BP being 77/54 and asymptomatic. He stated to make note of the BP. Pt is without pain and is having no discomfort and sleeping well but able to be aroused easily. I will monitor for one last BP then take to the floor.

## 2018-02-23 NOTE — Anesthesia Post-op Follow-up Note (Signed)
Anesthesia QCDR form completed.        

## 2018-02-23 NOTE — Consult Note (Signed)
[facility] Podiatry                                                      Patient Demographics  Brian [name], is a 58 y.o. male   MRN: [id number]   DOB - 12-Oct-1960  Admit Date - 02/22/2018    Outpatient Primary MD for the patient is Martin Majestic, FNP  Consult requested in the Hospital by Nicholes Mango, MD, On 02/23/2018    Reason for consult gangrene right great toe   With History of -  Past Medical History:  Diagnosis Date  . CHF (congestive heart failure) ([place])   . COPD (chronic obstructive pulmonary disease) ([place])   . Diabetes mellitus type 2 in obese ([place])   . DM (diabetes mellitus) type II controlled with renal manifestation ([place])   . Hypercholesteremia   . Hypertension   . Morbid obesity with BMI of 45.0-49.9, adult ([place])   . Myocardial infarction ([place])    2018 after infection of dialysis catheter  . Neuropathy   . Pancreatitis, acute   . Pneumonia   . Sleep apnea    CPAP  . Spinal stenosis       Past Surgical History:  Procedure Laterality Date  . AMPUTATION    . AV FISTULA PLACEMENT Left 04/21/2017   Procedure: ARTERIOVENOUS (AV) FISTULA CREATION ( BRACHIOCEPHALIC );  Surgeon: Algernon Huxley, MD;  Location: [facility] ORS;  Service: Vascular;  Laterality: Left;  . CHOLECYSTECTOMY  1998  . DIALYSIS/PERMA CATHETER INSERTION N/A 01/03/2017   Procedure: DIALYSIS/PERMA CATHETER INSERTION;  Surgeon: Algernon Huxley, MD;  Location: [place] [facility];  Service: Cardiovascular;  Laterality: N/A;  . DIALYSIS/PERMA CATHETER INSERTION N/A 01/31/2017   Procedure: DIALYSIS/PERMA CATHETER INSERTION;  Surgeon: Algernon Huxley, MD;  Location: [place] [facility];  Service: Cardiovascular;  Laterality: N/A;  . DIALYSIS/PERMA CATHETER REMOVAL N/A 01/24/2017   Procedure: DIALYSIS/PERMA CATHETER REMOVAL;  Surgeon: Algernon Huxley, MD;  Location: [place] [facility];   Service: Cardiovascular;  Laterality: N/A;  . DIALYSIS/PERMA CATHETER REMOVAL N/A 08/02/2017   Procedure: DIALYSIS/PERMA CATHETER REMOVAL;  Surgeon: Katha Cabal, MD;  Location: [place] [facility];  Service: Cardiovascular;  Laterality: N/A;  . HERNIA REPAIR     2017 when appendix removed  . LAPAROSCOPIC APPENDECTOMY N/A 01/06/2015   Procedure: APPENDECTOMY LAPAROSCOPIC drainage of peritoneal abcess;  Surgeon: Sherri Rad, MD;  Location: [facility] ORS;  Service: General;  Laterality: N/A;  . LOWER EXTREMITY ANGIOGRAPHY Right 02/13/2018   Procedure: LOWER EXTREMITY ANGIOGRAPHY;  Surgeon: Algernon Huxley, MD;  Location: [place] [facility];  Service: Cardiovascular;  Laterality: Right;  . TEE WITHOUT CARDIOVERSION N/A 01/28/2017   Procedure: TRANSESOPHAGEAL ECHOCARDIOGRAM (TEE);  Surgeon: Corey Skains, MD;  Location: [facility] ORS;  Service: Cardiovascular;  Laterality: N/A;    in for   Chief Complaint  Patient presents with  . Toe Pain     HPI  Brian [name]  is a 58 y.o. male, patient presented to the ER last night with a blackened right great toe.  He did undergone an angioplasty about 2 weeks ago by Dr. Leotis Pain.  He stated he was getting some early gangrene changes at that point that it has worsened over that timeframe.  ER doctor called last night and asked him to keep him  n.p.o. today in case we needed to proceed with amputation of the gangrenous digit.  He has had a similar problem on the left foot Dr. 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    Review of Systems    In addition to the HPI above,  No Fever-chills, No Headache, No changes with Vision or hearing, No problems swallowing food or Liquids, No Chest pain, Cough or Shortness of Breath, No Abdominal pain, No Nausea or Vommitting, Bowel movements are regular, No Blood in stool or Urine, No dysuria, No new skin rashes or bruises, No new joints pains-aches,  No  new weakness, tingling, numbness in any extremity, No recent weight gain or loss, No polyuria, polydypsia or polyphagia, No significant Mental Stressors.  A full 10 point Review of Systems was done, except as stated above, all other Review of Systems were negative.   Social History Social History   Tobacco Use  . Smoking status: Current Every Day Smoker    Packs/day: 0.50    Types: Cigarettes  . Smokeless tobacco: Never Used  Substance Use Topics  . Alcohol use: No    Family History Family History  Problem Relation Age of Onset  . Hypertension Mother   . Hyperlipidemia Mother   . Heart disease Father   . Heart disease Maternal Grandfather     Prior to Admission medications   Medication Sig Start Date End Date Taking? Authorizing Provider  albuterol (PROVENTIL HFA;VENTOLIN HFA) 108 (90 Base) MCG/ACT inhaler Inhale 2 puffs into the lungs every 4 (four) hours as needed for wheezing or shortness of breath.    Yes [provider]  aspirin EC 81 MG EC tablet Take 1 tablet (81 mg total) by mouth daily. 02/29/16  Yes [name], MD  atorvastatin (LIPITOR) 80 MG tablet Take 80 mg daily by mouth.   Yes [provider]  carvedilol (COREG) 3.125 MG tablet Take 1 tablet (3.125 mg total) by mouth 2 (two) times daily with a meal. 02/11/17  Yes Mody, Sital, MD  cephALEXin (KEFLEX) 500 MG capsule Take 500 mg by mouth 3 (three) times daily.   Yes [provider]  clopidogrel (PLAVIX) 75 MG tablet Take 1 tablet (75 mg total) by mouth daily. 02/13/18  Yes Dew, Erskine Squibb, MD  furosemide (LASIX) 40 MG tablet Take 40 mg by mouth 2 (two) times daily. Take 40 mg by mouth daily on non dialysis days Tuesday, Thursday, Saturday and Sunday   Yes [provider]  gabapentin (NEURONTIN) 300 MG capsule TAKE 1 CAPSULE BY MOUTH ONCE DAILY AT NIGHT 01/15/18  Yes [provider]  ibuprofen (ADVIL,MOTRIN) 200 MG tablet Take 400 mg by mouth daily as needed for headache or  moderate pain.    Yes [provider]  levothyroxine (SYNTHROID, LEVOTHROID) 50 MCG tablet Take 50 mcg daily before breakfast by mouth.   Yes [provider]  losartan (COZAAR) 25 MG tablet Take 25 mg by mouth daily.   Yes [provider]  sertraline (ZOLOFT) 100 MG tablet Take 100 mg by mouth daily.   Yes [provider]  sevelamer carbonate (RENVELA) 800 MG tablet Take 800 mg by mouth 3 (three) times daily with meals.   Yes [provider]    Anti-infectives (From admission, onward)   Start     Dose/Rate Route Frequency Ordered Stop   02/23/18 0615  [MAR Hold]  cefTRIAXone (ROCEPHIN) 1 g in sodium  chloride 0.9 % 100 mL IVPB     (MAR Hold since Thu 02/23/2018 at 0824. Reason: Transfer to a Procedural area.)   1 g 200 mL/hr over 30 Minutes Intravenous Every 24 hours 02/23/18 0611     02/23/18 0615  [MAR Hold]  vancomycin (VANCOCIN) 1,500 mg in sodium chloride 0.9 % 500 mL IVPB     (MAR Hold since Thu 02/23/2018 at 0824. Reason: Transfer to a Procedural area.)   1,500 mg 250 mL/hr over 120 Minutes Intravenous  Once 02/23/18 0611     02/22/18 2100  Ampicillin-Sulbactam (UNASYN) 3 g in sodium chloride 0.9 % 100 mL IVPB  Status:  Discontinued     3 g 200 mL/hr over 30 Minutes Intravenous Every 12 hours 02/22/18 2018 02/23/18 0611      Scheduled Meds: . [MAR Hold] aspirin EC  81 mg Oral Daily  . [MAR Hold] atorvastatin  80 mg Oral Daily  . [MAR Hold] carvedilol  3.125 mg Oral BID WC  . [MAR Hold] clopidogrel  75 mg Oral Daily  . [MAR Hold] docusate sodium  100 mg Oral BID  . [MAR Hold] furosemide  40 mg Oral 2 times per day on Sun Tue Thu Sat  . [MAR Hold] gabapentin  300 mg Oral QHS  . [MAR Hold] heparin  5,000 Units Subcutaneous Q8H  . [MAR Hold] insulin aspart  0-15 Units Subcutaneous TID WC  . [MAR Hold] insulin aspart  0-5 Units Subcutaneous QHS  . [MAR Hold] levothyroxine  50 mcg Oral QAC breakfast  . [MAR Hold] losartan  25 mg Oral Daily  .  [MAR Hold] sertraline  100 mg Oral Daily  . [MAR Hold] sevelamer carbonate  800 mg Oral TID WC   Continuous Infusions: . [MAR Hold] sodium chloride 250 mL (02/23/18 0752)  . [MAR Hold] cefTRIAXone (ROCEPHIN)  IV 1 g (02/23/18 0754)  . [MAR Hold] vancomycin     PRN Meds:.[MAR Hold] sodium chloride, [MAR Hold] acetaminophen **OR** [MAR Hold] acetaminophen, [MAR Hold] albuterol, [MAR Hold] bisacodyl, [MAR Hold] HYDROcodone-acetaminophen, [MAR Hold] ondansetron **OR** [MAR Hold] ondansetron (ZOFRAN) IV  Allergies  Allergen Reactions  . Naproxen Rash    Physical Exam  Vitals  Blood pressure 121/73, pulse 84, temperature 98.9 F (37.2 C), temperature source Tympanic, resp. rate 20, height 5\' 10"  (1.778 m), weight 125.5 kg, SpO2 100 %.  Lower Extremity exam:  Vascular: Nonpalpable pulses right previous amputation left first ray.  Patient went underwent angioplasty 2 weeks ago.  Dermatological: Necrotic tissue at the base of the great toe dorsally medially and laterally plantarly there is little extension of some gangrenous changes to the plantar metatarsal head region.  Some erythema to the foot in general second toe has some issues that may be getting started at this point but so far the toe is viable and not gangrenous.  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  Data Review  CBC Recent Labs  Lab 02/22/18 1758 02/23/18 0436  WBC 16.8* 13.9*  HGB 11.5* 10.2*  HCT 36.0* 32.3*  PLT 254 219  MCV 98.6 99.1  MCH 31.5 31.3  MCHC 31.9 31.6  RDW 15.7* 15.7*  LYMPHSABS 1.1  --   MONOABS 0.5  --   EOSABS 0.2  --   BASOSABS 0.1  --    ------------------------------------------------------------------------------------------------------------------  Chemistries  Recent Labs  Lab 02/22/18 1758 02/23/18 0436  NA 134* 135  K 2.8* 3.2*  CL 96*  98  CO2 24 23  GLUCOSE 199* 121*  BUN 45* 51*   CREATININE 4.91* 5.64*  CALCIUM 7.6* 7.2*  AST 12*  --   ALT 8  --   ALKPHOS 83  --   BILITOT 0.5  --    ------------------------------------------------------------------------------------------------------------------ estimated creatinine clearance is 19 mL/min (A) (by C-G formula based on SCr of 5.64 mg/dL (H)). ------------------------------------------------------------------------------------------------------------------ No results for input(s): TSH, T4TOTAL, T3FREE, THYROIDAB in the last 72 hours.  Invalid input(s): FREET3 Urinalysis    Component Value Date/Time   COLORURINE YELLOW (A) 08/12/2014 1852   APPEARANCEUR CLOUDY (A) 08/12/2014 1852   APPEARANCEUR Clear 08/24/2012 2354   LABSPEC 1.037 (H) 08/12/2014 1852   LABSPEC 1.020 08/24/2012 2354   PHURINE 5.0 08/12/2014 1852   GLUCOSEU >500 (A) 08/12/2014 1852   GLUCOSEU Negative 08/24/2012 2354   HGBUR 1+ (A) 08/12/2014 1852   BILIRUBINUR NEGATIVE 08/12/2014 1852   BILIRUBINUR Negative 08/24/2012 2354   KETONESUR NEGATIVE 08/12/2014 1852   PROTEINUR >500 (A) 08/12/2014 1852   NITRITE NEGATIVE 08/12/2014 1852   LEUKOCYTESUR NEGATIVE 08/12/2014 1852   LEUKOCYTESUR Negative 08/24/2012 2354     Imaging results:   Dg Foot Complete Right  Result Date: 02/22/2018 CLINICAL DATA:  Recent angioplasty.  Cellulitis of the great toe. EXAM: RIGHT FOOT COMPLETE - 3+ VIEW COMPARISON:  None. FINDINGS: There is no evidence of fracture or dislocation. There is plantar calcaneal spur. There is no osteopenia or bony destruction to suggest osteomyelitis. IMPRESSION: No evidence of osteomyelitis of the great toe. Electronically Signed   By: Abelardo Diesel M.D.   On: 02/22/2018 19:40    Assessment & Plan: Saw the patient this morning prior to surgery I explained to him the situation with a gangrenous toe and that it is a greater risk of infection if he leaves it.  He is aware of the problems with this and underwent first ray amputation  with Dr. Caryl Comes a couple years ago so he understands what is involved with it.  He also understands that this is something that may not heal that he may end up with a transmetatarsal amputation or possibly below-knee amputation if circulation is severely compromised.  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  Albertine Patricia M.D on 02/23/2018 at 8:39 AM  Thank you for the consult, we will follow the patient with you in the Hospital.

## 2018-02-23 NOTE — H&P (Signed)
H and P has been reviewed and no changes are noted. The consent was explained to patient.  He understand that this is a limb threatening situation and due to his severe PAD he may not heal from this surgery and may need further surgery in the future.

## 2018-02-23 NOTE — Transfer of Care (Signed)
Immediate Anesthesia Transfer of Care Note  Patient: Brian [name]  Procedure(s) Performed: AMPUTATION TOE 1ST RAY (Right Foot)  Patient Location: PACU  Anesthesia Type:MAC  Level of Consciousness: awake, alert  and oriented  Airway & Oxygen Therapy: Patient Spontanous Breathing and Patient connected to face mask oxygen  Post-op Assessment: Report given to RN and Post -op Vital signs reviewed and stable  Post vital signs: Reviewed and stable  Last Vitals:  Vitals Value Taken Time  BP    Temp    Pulse 83 02/23/2018  9:50 AM  Resp 15 02/23/2018  9:50 AM  SpO2 100 % 02/23/2018  9:50 AM  Vitals shown include unvalidated device data.  Last Pain:  Vitals:   02/23/18 0830  TempSrc: Tympanic  PainSc: 0-No pain      Patients Stated Pain Goal: 3 (18/84/16 6063)  Complications: No apparent anesthesia complications

## 2018-02-23 NOTE — Care Management (Signed)
Brian [name] dialysis liaison notified of admission.    

## 2018-02-23 NOTE — Progress Notes (Signed)
MD notified about pt.'s BP being in the 90s/50s manually. Verbal order to place midodrine 5 mg TID.  Scotlyn Mccranie [employer]

## 2018-02-23 NOTE — Progress Notes (Signed)
Pharmacy Antibiotic Note  Brian [name] is a 58 y.o. male admitted on 02/22/2018 with cellulitis.  Pharmacy has been consulted for vancomycin dosing.  Plan: Patient ordered vanc 1.5g IV x 1  Patient is apparently a dialysis patient; unsure of schedule Will order vanc random for am labs until nephrology can see patient.  Height: 5\' 10"  (177.8 cm) Weight: 276 lb 10.8 oz (125.5 kg) IBW/kg (Calculated) : 73  Temp (24hrs), Avg:98.6 F (37 C), Min:97.9 F (36.6 C), Max:99.1 F (37.3 C)  Recent Labs  Lab 02/22/18 1758 02/23/18 0436  WBC 16.8* 13.9*  CREATININE 4.91* 5.64*    Estimated Creatinine Clearance: 19 mL/min (A) (by C-G formula based on SCr of 5.64 mg/dL (H)).    Allergies  Allergen Reactions  . Naproxen Rash    Thank you for allowing pharmacy to be a part of this patient's care.  Tobie Lords, PharmD, BCPS Clinical Pharmacist 02/23/2018

## 2018-02-23 NOTE — H&P (Signed)
 [facility] History & Physical Update  The patient was interviewed and re-examined.  The patient's previous History and Physical has been reviewed and is unchanged.  After discussions with Dr. Elvina Mattes this morning, the patient is a very high risk for major amputation and further surgery needed.  His bleeding at surgery was okay, but concern remains for malperfusion.  We have decided to proceed with another angiogram today. We plan to proceed with the scheduled procedure.  Leotis Pain, MD  02/23/2018, 1:00 PM

## 2018-02-23 NOTE — Progress Notes (Signed)
DJodell Cipro notified of hypotension; acknowledged; new orders written. Barbaraann Faster, RN 6:12 AM 02/23/2018

## 2018-02-23 NOTE — Op Note (Signed)
[facility]  Percutaneous Study/Intervention Procedural Note   Date of Surgery: 02/23/2018  Surgeon(s):Irvin Bastin    Assistants:none  Pre-operative Diagnosis: PAD with gangrene right foot  Post-operative diagnosis:  Same  Procedure(s) Performed:             1.  Ultrasound guidance for vascular access left femoral artery             2.  Catheter placement into right SFA from left femoral approach             3.  Aortogram and selective right lower extremity angiogram             4.  StarClose closure device left femoral artery  EBL: 5 cc  Contrast: 35 cc  Fluoro Time: 1.8 minutes  Moderate Conscious Sedation Time: approximately 30 minutes using 2 mg of Versed and 100 Mcg of Fentanyl              Indications:  Patient is a 58 y.o.male with worsening gangrenous changes to the toes on the right foot.  He underwent revascularization a couple of weeks ago, but has had worsening foot changes.  In discussions with his podiatrist, he requested another angiogram to further evaluate his perfusion and see if anything can be done to help healing. The patient is brought in for angiography for further evaluation and potential treatment.  Due to the limb threatening nature of the situation, angiogram was performed for attempted limb salvage. The patient is aware that if the procedure fails, amputation would be expected.  The patient also understands that even with successful revascularization, amputation may still be required due to the severity of the situation.  Risks and benefits are discussed and informed consent is obtained.   Procedure:  The patient was identified and appropriate procedural time out was performed.  The patient was then placed supine on the table and prepped and draped in the usual sterile fashion. Moderate conscious sedation was administered during a face to face encounter with the patient throughout the procedure with my supervision of the RN administering  medicines and monitoring the patient's vital signs, pulse oximetry, telemetry and mental status throughout from the start of the procedure until the patient was taken to the recovery room. Ultrasound was used to evaluate the left common femoral artery.  It was patent .  A digital ultrasound image was acquired.  A Seldinger needle was used to access the left common femoral artery under direct ultrasound guidance and a permanent image was performed.  A 0.035 J wire was advanced without resistance and a 5Fr sheath was placed.  Pigtail catheter was placed into the aorta and an AP aortogram was performed. This demonstrated normal renal arteries and normal aorta and iliac segments without significant stenosis. I then crossed the aortic bifurcation with the pigtail catheter and a J-wire and advanced to the right femoral head and down into the proximal to mid right SFA to help opacify distally better. Selective right lower extremity angiogram was then performed. This demonstrated normal common femoral artery, profunda femoris artery, and proximal SFA.  In the distal SFA and above-knee popliteal artery there was mild stenosis in the 20 to 30% range.  The tibioperoneal trunk and proximal peroneal artery had mild stenosis in the 20 to 30% range.  The tibioperoneal trunk actually fed the anterior tibial artery which was chronically occluded in the peroneal artery.  The posterior tibial artery had a proximal separate takeoff and was continuous to  the foot without obvious focal stenosis.  He did have small vessel disease with poor flow into the digits themselves, but no focal stenosis that would benefit from treatment.  I elected to terminate the procedure. The sheath was removed and StarClose closure device was deployed in the left femoral artery with excellent hemostatic result. The patient was taken to the recovery room in stable condition having tolerated the procedure well.  Findings:               Aortogram:  Normal  renal arteries.  Normal aorta and neck arteries without significant stenosis             Right lower Extremity:  Normal common femoral artery, profunda femoris artery, and proximal SFA.  In the distal SFA and above-knee popliteal artery there was mild stenosis in the 20 to 30% range.  The tibioperoneal trunk and proximal peroneal artery had mild stenosis in the 20 to 30% range.  The tibioperoneal trunk actually fed the anterior tibial artery which was chronically occluded in the peroneal artery.  The posterior tibial artery had a proximal separate takeoff and was continuous to the foot without obvious focal stenosis.  He did have small vessel disease with poor flow into the digits themselves, but no focal stenosis that would benefit from treatment.   Disposition: Patient was taken to the recovery room in stable condition having tolerated the procedure well.  Complications: None  Leotis Pain 02/23/2018 1:57 PM   This note was created with Dragon Medical transcription system. Any errors in dictation are purely unintentional.

## 2018-02-24 ENCOUNTER — Encounter: Payer: Self-pay | Admitting: Podiatry

## 2018-02-24 LAB — GLUCOSE, CAPILLARY
Glucose-Capillary: 121 mg/dL — ABNORMAL HIGH (ref 70–99)
Glucose-Capillary: 118 mg/dL — ABNORMAL HIGH (ref 70–99)
Glucose-Capillary: 169 mg/dL — ABNORMAL HIGH (ref 70–99)

## 2018-02-24 LAB — CBC
HCT: 32 % — ABNORMAL LOW (ref 39.0–52.0)
Hemoglobin: 9.9 g/dL — ABNORMAL LOW (ref 13.0–17.0)
MCH: 31.7 pg (ref 26.0–34.0)
MCHC: 30.9 g/dL (ref 30.0–36.0)
MCV: 102.6 fL — ABNORMAL HIGH (ref 80.0–100.0)
Platelets: 190 10*3/uL (ref 150–400)
RBC: 3.12 MIL/uL — ABNORMAL LOW (ref 4.22–5.81)
RDW: 16 % — AB (ref 11.5–15.5)
WBC: 14.8 10*3/uL — ABNORMAL HIGH (ref 4.0–10.5)
nRBC: 0 % (ref 0.0–0.2)

## 2018-02-24 LAB — SURGICAL PATHOLOGY

## 2018-02-24 LAB — HIV ANTIBODY (ROUTINE TESTING W REFLEX): HIV SCREEN 4TH GENERATION: NONREACTIVE

## 2018-02-24 MED ORDER — DIPHENHYDRAMINE-ZINC ACETATE 2-0.1 % EX CREA
TOPICAL_CREAM | Freq: Two times a day (BID) | CUTANEOUS | Status: DC | PRN
  Filled 2018-02-24 (×2): qty 28

## 2018-02-24 MED ORDER — MIDODRINE HCL 5 MG PO TABS: 5.0000 mg | ORAL_TABLET | Freq: Three times a day (TID) | ORAL | 0 refills | Status: AC

## 2018-02-24 MED ORDER — DOCUSATE SODIUM 100 MG PO CAPS: 100.0000 mg | ORAL_CAPSULE | Freq: Two times a day (BID) | ORAL | 0 refills | Status: AC

## 2018-02-24 MED ORDER — OXYCODONE HCL 5 MG PO TABS: 5.0000 mg | ORAL_TABLET | Freq: Four times a day (QID) | ORAL | 0 refills | Status: AC | PRN

## 2018-02-24 MED ORDER — OXYCODONE HCL 5 MG PO TABS
5.0000 mg | ORAL_TABLET | Freq: Four times a day (QID) | ORAL | Status: DC | PRN
  Administered 2018-02-24: 10 mg via ORAL
  Filled 2018-02-24: qty 2

## 2018-02-24 MED ORDER — AMOXICILLIN-POT CLAVULANATE 875-125 MG PO TABS: 1.0000 | ORAL_TABLET | Freq: Two times a day (BID) | ORAL | Status: DC

## 2018-02-24 MED ORDER — AMOXICILLIN-POT CLAVULANATE 500-125 MG PO TABS: 500.0000 mg | ORAL_TABLET | Freq: Every day | ORAL | 0 refills | Status: AC

## 2018-02-24 MED ORDER — ACETAMINOPHEN 325 MG PO TABS: 650.0000 mg | ORAL_TABLET | Freq: Four times a day (QID) | ORAL | Status: AC | PRN

## 2018-02-24 MED ORDER — ONDANSETRON HCL 4 MG PO TABS: 4.0000 mg | ORAL_TABLET | Freq: Three times a day (TID) | ORAL | 0 refills | Status: AC | PRN

## 2018-02-24 MED ORDER — POTASSIUM CHLORIDE CRYS ER 20 MEQ PO TBCR
40.0000 meq | EXTENDED_RELEASE_TABLET | Freq: Once | ORAL | Status: AC
  Administered 2018-02-24: 40 meq via ORAL
  Filled 2018-02-24: qty 2

## 2018-02-24 MED ORDER — AMOXICILLIN-POT CLAVULANATE 500-125 MG PO TABS
500.0000 mg | ORAL_TABLET | Freq: Every day | ORAL | Status: DC
  Administered 2018-02-24: 500 mg via ORAL
  Filled 2018-02-24: qty 1

## 2018-02-24 MED ORDER — BISACODYL 5 MG PO TBEC: 5.0000 mg | DELAYED_RELEASE_TABLET | Freq: Every day | ORAL | 0 refills | Status: AC | PRN

## 2018-02-24 MED ORDER — AMOXICILLIN-POT CLAVULANATE 500-125 MG PO TABS
500.0000 mg | ORAL_TABLET | Freq: Every day | ORAL | Status: DC
  Filled 2018-02-24: qty 1

## 2018-02-24 NOTE — Progress Notes (Signed)
Pre HD Assessment    02/24/18 1310  Neurological  Level of Consciousness Alert  Orientation Level Oriented X4  Respiratory  Respiratory Pattern Regular;Unlabored;Symmetrical  Chest Assessment Chest expansion symmetrical  Bilateral Breath Sounds Clear;Diminished  Cardiac  Pulse Regular  Heart Sounds S1, S2  Jugular Venous Distention (JVD) No  ECG Monitor Yes  Cardiac Rhythm NSR  Ectopy Unifocal PVC's  Ectopy Frequency Occasional  Antiarrhythmic device No  Vascular  R Dorsalis Pedis Pulse +1  L Dorsalis Pedis Pulse +1  Edema Right lower extremity;Left lower extremity  RLE Edema +1  LLE Edema +1  Integumentary  Integumentary (WDL) X  Skin Color Red  Skin Condition Dry;Flaky  Skin Integrity Surgical Incision (see LDA);Amputation  Musculoskeletal  Musculoskeletal (WDL) X  Generalized Weakness Yes  Gastrointestinal  Bowel Sounds Assessment Active  GU Assessment  Genitourinary (WDL) X (HD pt)  Genitourinary Symptoms Oliguria  Psychosocial  Psychosocial (WDL) WDL  Incision (Closed) 02/23/18 Foot Right  Date First Assessed/Time First Assessed: 02/23/18 0834   Location: Foot  Location Orientation: Right  Site / Wound Assessment Dressing in place / Unable to assess

## 2018-02-24 NOTE — Care Management Note (Signed)
Case Management Note  Patient Details  Name: Brian [name] MRN: [id number] Date of Birth: 1960/07/26   Patient to discharge home today.  Brian [name] dialysis liaison notified of discharge.  Patient lives at home with wife.  She is present for assessment.  PCP Brian [name].  Patient denies issues with transportation.  States he drives himself to HD. Patient states that he has a RW, cane, and O2 concentrator in the home.  PT has assessed patient and recommends home health PT.  Patient declines any home health services at discharge.  MD notified.   Patient to changes dressing twice next week with protective gauze.  Then follow up with podiatry.  Per hospitatlist, patient will be able to change his on dressing next week.  Bedside RN to educate.   WC order placed [name] with [name] has met with patient, and patient to pick up at the retail store    Subjective/Objective:                    Action/Plan:   Expected Discharge Date:  02/24/18               Expected Discharge Plan:  Home/Self Care  In-House Referral:     Discharge planning Services  CM Consult  Post Acute Care Choice:  Durable Medical Equipment, Home Health Choice offered to:  Patient, Spouse  DME Arranged:  Wheelchair manual DME Agency:  [name]:  Patient Refused [name] Agency:     Status of Service:  Completed, signed off  If discussed at [employer] of Stay Meetings, dates discussed:    Additional CommentsBeverly Sessions, RN 02/24/2018, 2:07 PM

## 2018-02-24 NOTE — Evaluation (Signed)
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  R foot bandaged with post-op dressing; post-op shoe donned for all [place] activities.  Pain rated 3-4 at rest, 5-6 with WBing.  Able to complete bed mobility with mod indep; sit/stand, basic transfers and gait (5') with RW, cga/min assist.  Requires elevated surface height, use of UEs and increased momentum to complete sit/stand from all surface heights.  Min cuing for walker management, gait sequence for optimal adherence to [facility] restrictions and proper use of post-op shoe.  Fatigues quickly; additional gait distance self-limited by patient due to LE weakness, fatigue, pain.  May benefit from use of manual WC for household mobilization until [place] restrictions lifted. Would benefit from skilled PT to address above deficits and promote optimal return to PLOF; Recommend transition to [place] upon discharge from acute hospitalization.   Patient suffers from R 1st ray amputation which impairs his/her ability to perform daily activities like toileting, feeding, dressing, grooming, bathing in the home. A cane, walker, crutch will not resolve the patient's issue with performing activities of daily living. A wheelchair is required/recommended and will allow patient to safely perform daily activities.   Patient can safely propel the wheelchair in the home or has a caregiver who can provide assistance.      Follow Up Recommendations Home health PT    Equipment Recommendations  Wheelchair (measurements PT);Wheelchair cushion (measurements PT);Rolling walker with 5"  wheels(20W x 18D manual WC with articulating leg rests, pull to lock brakes, anti-tip bars)    Recommendations for Other Services       Precautions / Restrictions Precautions Precautions: Fall Precaution Comments: R LE post-op shoe (heel wedge) Restrictions Weight Bearing Restrictions: Yes RLE Weight Bearing: Partial weight bearing      Mobility  Bed Mobility Overal bed mobility: Modified Independent                Transfers Overall transfer level: Needs assistance Equipment used: Rolling walker (2 wheeled) Transfers: Sit to/from Stand Sit to Stand: Min assist         General transfer comment: use of momentum, bilat UEs (and elevated surface) to complete  Ambulation/Gait Ambulation/Gait assistance: Min guard;Min assist Gait Distance (Feet): 5 Feet Assistive device: Rolling walker (2 wheeled)       General Gait Details: step to gait pattern; min cuing for gait sequence and R LE WBing precautions, offloading shoe use; additional distance self-limited by patient due to LE weakness/fatigue/pain in R foot  Stairs            Wheelchair Mobility    Modified Rankin (Stroke Patients Only)       Balance Overall balance assessment: Needs assistance Sitting-balance support: No upper extremity supported;Feet supported Sitting balance-Leahy Scale: Good     Standing balance support: Bilateral upper extremity supported Standing balance-Leahy Scale: Fair                               Pertinent Vitals/Pain Pain Assessment: Faces Faces Pain Scale: Hurts little more Pain Location: R foot Pain Descriptors / Indicators: Aching;Grimacing;Guarding Pain Intervention(s): Repositioned;Monitored during session;Limited  activity within patient's tolerance    Home Living Family/patient expects to be discharged to:: Private residence Living Arrangements: Spouse/significant other Available Help at Discharge: Family;Available 24 hours/day Type of Home:  Mobile home Home Access: Ramped entrance     Home Layout: One level Home Equipment: Walker - 2 wheels      Prior Function Level of Independence: Independent with assistive device(s)         Comments: Mod indep with RW for ADLs, household and limited community distances; uses scooters (from stores) for longer distance.  Drives self to/from dialysis 3x/week.     Hand Dominance        Extremity/Trunk Assessment   Upper Extremity Assessment Upper Extremity Assessment: Overall WFL for tasks assessed    Lower Extremity Assessment Lower Extremity Assessment: Generalized weakness(grossly at least 4-/5 throughout)       Communication   Communication: No difficulties  Cognition Arousal/Alertness: Awake/alert Behavior During Therapy: WFL for tasks assessed/performed Overall Cognitive Status: Within Functional Limits for tasks assessed                                        General Comments      Exercises Other Exercises Other Exercises: Reviewed wearing schedule, donning/doffing, use of offloading shoe and RW; patient voiced understanding Other Exercises: Sit/stand from various surface heights with RW, min assist (edge of bed, recliner).  Reviewed mechanics of sit/stand and seating accommodations (higher surfaces heights with armrests); patient voiced undertanding.   Assessment/Plan    PT Assessment Patient needs continued PT services  PT Problem List Decreased strength;Decreased range of motion;Decreased activity tolerance;Decreased balance;Decreased mobility;Decreased coordination;Decreased cognition;Decreased knowledge of use of DME;Decreased safety awareness;Decreased knowledge of precautions;Obesity;Decreased skin integrity;Pain       PT Treatment Interventions DME instruction;Gait training;Functional mobility training;Therapeutic activities;Therapeutic exercise;Balance training;Cognitive remediation;Patient/family education    PT Goals (Current  goals can be found in the Care Plan section)  Acute Rehab PT Goals Patient Stated Goal: to return home after dialyis PT Goal Formulation: With patient/family Time For Goal Achievement: 03/10/18 Potential to Achieve Goals: Good    Frequency 7X/week   Barriers to discharge        Co-evaluation               AM-PAC PT "6 Clicks" Mobility  Outcome Measure Help needed turning from your back to your side while in a flat bed without using bedrails?: None Help needed moving from lying on your back to sitting on the side of a flat bed without using bedrails?: None Help needed moving to and from a bed to a chair (including a wheelchair)?: A Little Help needed standing up from a chair using your arms (e.g., wheelchair or bedside chair)?: A Little Help needed to walk in hospital room?: A Little Help needed climbing 3-5 steps with a railing? : A Little 6 Click Score: 20    End of Session Equipment Utilized During Treatment: Gait belt Activity Tolerance: Patient tolerated treatment well Patient left: in chair;with call bell/phone within reach;with chair alarm set;with family/visitor present Nurse Communication: Mobility status PT Visit Diagnosis: Unsteadiness on feet (R26.81);Muscle weakness (generalized) (M62.81);Difficulty in walking, not elsewhere classified (R26.2);Pain Pain - Right/Left: Right Pain - part of body: Ankle and joints of foot    Time: 5361-4431 PT Time Calculation (min) (ACUTE ONLY): 31 min   Charges:   PT Evaluation $PT Eval Moderate Complexity: 1 Mod PT  Treatments $Therapeutic Activity: 23-37 mins        Shamaine Mulkern H. Owens Shark, PT, DPT, NCS 02/24/18, 11:25 AM [phone]

## 2018-02-24 NOTE — Progress Notes (Signed)
Patient discharged to home via POV. IV removed. Prescriptions given, Instructions given and all questions answered. Extra supplies given for dressing change.

## 2018-02-24 NOTE — Discharge Summary (Addendum)
[facility] [place] NAME: Brian [name]    MR#:  [id number]  DATE OF BIRTH:  25-Nov-1960  DATE OF ADMISSION:  02/22/2018 ADMITTING PHYSICIAN: Sedalia Muta, MD  DATE OF DISCHARGE:  02/24/18   PRIMARY CARE PHYSICIAN: Martin Majestic, FNP    ADMISSION DIAGNOSIS:  Pain of foot, unspecified laterality [M79.673]  DISCHARGE DIAGNOSIS:  Active Problems:   Toe necrosis ([place])   SECONDARY DIAGNOSIS:   Past Medical History:  Diagnosis Date  . CHF (congestive heart failure) ([place])   . COPD (chronic obstructive pulmonary disease) ([place])   . Diabetes mellitus type 2 in obese ([place])   . DM (diabetes mellitus) type II controlled with renal manifestation ([place])   . Hypercholesteremia   . Hypertension   . Morbid obesity with BMI of 45.0-49.9, adult ([place])   . Myocardial infarction ([place])    2018 after infection of dialysis catheter  . Neuropathy   . Pancreatitis, acute   . Pneumonia   . Sleep apnea    CPAP  . Spinal stenosis     HOSPITAL COURSE:   Brian [name]  is a 58 y.o. male with a known history listed below presented to emergency room for evaluation of black right toe.  Patient has right toe ulceration for last few weeks and underwent right lower extremity angioplasty 1 week ago.  Last 3 to 4 days he has noticed right toe is turning black with some area of bleeding.  He does not see any pus drainage.  He denies fever or chills.  He denies any pain or swelling.  He underwent dialysis today.  Dialysis nurse evaluated and discuss with nephrologist on-call who recommended patient to come to emergency room for further evaluation.  In emergency room patient underwent routine labs and x-ray of the toe.  Lab suggestive of elevated WBC.  Patient started on IV antibiotic.  ER physician has discussed case with vascular surgery, podiatry who recommended amputation.  Hospitalist team requested for admission.   1.Right toe gangrene: Postop day  #1 Patient received IV antibiotics vancomycin and Rocephin during the hospital course.  Patient had first ray of the right foot amputation on 02/23/2018.  Also had diagnostic angiogram with no significant stenosis and no need of further procedures by vascular surgery Dr. Elvina Mattes is agreeable to discharge the patient after dressing change today and patient will be discharged home with Augmentin for 1 more week Patient will see Dr. Elvina Mattes in a week for follow-up visit and dressing changes as recommended by Dr. Elvina Mattes Patient has to use postop shoe and walker with ambulation PT assessment-home health PT  Patient with recent angioplasty. Resume Plavix and aspirin   Pain management as needed  2.History of ZJQ:BHALPFXTK compensated. Diastolic in nature. Continue home medications.  3. End-stage renal disease on hemodialysis:Nephrology consult requested. He is on Monday, Wednesday, Friday schedule. Follow-up nephrology recommendation. Continue home medications.  4. Essential hypertension:Monitor. Blood pressure is soft holding home medications.  Patient is on [place] drain  5.  Hypokalemia repleted   6. Diabetes: SSI provided during the hospital course.  Carb controlled diet  7. Hypothyroid: Continue Synthroid  DVT prophylaxis:Heparin subcutaneous DISCHARGE CONDITIONS:   fair  CONSULTS OBTAINED:     PROCEDURES right first ray amputation of the foot 02/23/2018  DRUG ALLERGIES:   Allergies  Allergen Reactions  . Naproxen Rash    DISCHARGE MEDICATIONS:   Allergies as of 02/24/2018      Reactions   Naproxen  Rash      Medication List    STOP taking these medications   carvedilol 3.125 MG tablet Commonly known as:  COREG   cephALEXin 500 MG capsule Commonly known as:  KEFLEX   furosemide 40 MG tablet Commonly known as:  LASIX   losartan 25 MG tablet Commonly known as:  COZAAR     TAKE these medications   acetaminophen 325 MG tablet Commonly known  as:  TYLENOL Take 2 tablets (650 mg total) by mouth every 6 (six) hours as needed for mild pain (or Fever >/= 101).   albuterol 108 (90 Base) MCG/ACT inhaler Commonly known as:  PROVENTIL HFA;VENTOLIN HFA Inhale 2 puffs into the lungs every 4 (four) hours as needed for wheezing or shortness of breath.   amoxicillin-clavulanate 500-125 MG tablet Commonly known as:  AUGMENTIN Take 1 tablet (500 mg total) by mouth daily.   aspirin 81 MG EC tablet Take 1 tablet (81 mg total) by mouth daily.   atorvastatin 80 MG tablet Commonly known as:  LIPITOR Take 80 mg daily by mouth.   bisacodyl 5 MG EC tablet Commonly known as:  DULCOLAX Take 1 tablet (5 mg total) by mouth daily as needed for moderate constipation.   clopidogrel 75 MG tablet Commonly known as:  PLAVIX Take 1 tablet (75 mg total) by mouth daily.   docusate sodium 100 MG capsule Commonly known as:  COLACE Take 1 capsule (100 mg total) by mouth 2 (two) times daily.   gabapentin 300 MG capsule Commonly known as:  NEURONTIN TAKE 1 CAPSULE BY MOUTH ONCE DAILY AT NIGHT   ibuprofen 200 MG tablet Commonly known as:  ADVIL,MOTRIN Take 400 mg by mouth daily as needed for headache or moderate pain.   levothyroxine 50 MCG tablet Commonly known as:  SYNTHROID, LEVOTHROID Take 50 mcg daily before breakfast by mouth.   midodrine 5 MG tablet Commonly known as:  PROAMATINE Take 1 tablet (5 mg total) by mouth 3 (three) times daily with meals.   ondansetron 4 MG tablet Commonly known as:  ZOFRAN Take 1 tablet (4 mg total) by mouth every 8 (eight) hours as needed for nausea.   oxyCODONE 5 MG immediate release tablet Commonly known as:  Oxy IR/ROXICODONE Take 1-2 tablets (5-10 mg total) by mouth every 6 (six) hours as needed for moderate pain, severe pain or breakthrough pain.   sertraline 100 MG tablet Commonly known as:  ZOLOFT Take 100 mg by mouth daily.   sevelamer carbonate 800 MG tablet Commonly known as:  RENVELA Take  800 mg by mouth 3 (three) times daily with meals.            Durable Medical Equipment  (From admission, onward)         Start     Ordered   02/24/18 1114  For home use only DME standard manual wheelchair with seat cushion  Once    Comments:  Patient had a right ray amputation , peripheral vascular disease which impairs their ability to perform daily activities like walking in the home.  A walker will not resolve  issue with performing activities of daily living. A wheelchair will allow patient to safely perform daily activities. Patient can safely propel the wheelchair in the home or has a caregiver who can provide assistance.  Accessories: elevating leg rests (ELRs), wheel locks, extensions and anti-tippers.   02/24/18 1114   02/24/18 1002  For home use only DME Walker rolling  Once    Question:  Patient needs a walker to treat with the following condition  Answer:  Weakness   02/24/18 1002           DISCHARGE INSTRUCTIONS:   Follow-up with primary care physician in 3 to 4 days Continue hemodialysis on Monday Wednesday and Friday and follow-up with nephrology in a week Follow-up with podiatry Dr. Elvina Mattes in a week Follow-up with vascular surgery Dr. Lucky Cowboy in a month  DIET:  Diabetic diet and Renal diet  DISCHARGE CONDITION:  Fair  ACTIVITY:  Activity as tolerated per PT recommendations, use postop shoe and walker as per Dr. Elvina Mattes  OXYGEN:  Home Oxygen: No.   Oxygen Delivery: room air  DISCHARGE LOCATION:  home   If you experience worsening of your admission symptoms, develop shortness of breath, life threatening emergency, suicidal or homicidal thoughts you must seek medical attention immediately by calling 911 or calling your MD immediately  if symptoms less severe.  You Must read complete instructions/literature along with all the possible adverse reactions/side effects for all the Medicines you take and that have been prescribed to you. Take any new Medicines  after you have completely understood and accpet all the possible adverse reactions/side effects.   Please note  You were cared for by a hospitalist during your hospital stay. If you have any questions about your discharge medications or the care you received while you were in the hospital after you are discharged, you can call the unit and asked to speak with the hospitalist on call if the hospitalist that took care of you is not available. Once you are discharged, your primary care physician will handle any further medical issues. Please note that NO REFILLS for any discharge medications will be authorized once you are discharged, as it is imperative that you return to your primary care physician (or establish a relationship with a primary care physician if you do not have one) for your aftercare needs so that they can reassess your need for medications and monitor your lab values.     Today  Chief Complaint  Patient presents with  . Toe Pain   Patient is doing fine.  No overnight incidents and wants to go home after hemodialysis and dressing changes today  ROS:  CONSTITUTIONAL: Denies fevers, chills. Denies any fatigue, weakness.  EYES: Denies blurry vision, double vision, eye pain. EARS, NOSE, THROAT: Denies tinnitus, ear pain, hearing loss. RESPIRATORY: Denies cough, wheeze, shortness of breath.  CARDIOVASCULAR: Denies chest pain, palpitations, edema.  GASTROINTESTINAL: Denies nausea, vomiting, diarrhea, abdominal pain. Denies bright red blood per rectum. GENITOURINARY: Denies dysuria, hematuria. ENDOCRINE: Denies nocturia or thyroid problems. HEMATOLOGIC AND LYMPHATIC: Denies easy bruising or bleeding. SKIN: Denies rash or lesion. MUSCULOSKELETAL: Denies pain in neck, back, shoulder, knees, hips or arthritic symptoms.  NEUROLOGIC: Denies paralysis, paresthesias.  PSYCHIATRIC: Denies anxiety or depressive symptoms.   VITAL SIGNS:  Blood pressure (!) 92/59, pulse 90, temperature  98.6 F (37 C), temperature source Oral, resp. rate 20, height 5\' 10"  (1.778 m), weight 125.9 kg, SpO2 95 %.  I/O:    Intake/Output Summary (Last 24 hours) at 02/24/2018 1156 Last data filed at 02/23/2018 2158 Gross per 24 hour  Intake 100.38 ml  Output 0 ml  Net 100.38 ml    PHYSICAL EXAMINATION:   GENERAL:  58 y.o.-year-old patient lying in the bed with no acute distress.  EYES: Pupils equal, round, reactive to light and accommodation. No scleral icterus. Extraocular muscles intact.  HEENT: Head atraumatic, normocephalic. Oropharynx and nasopharynx  clear.  NECK:  Supple, no jugular venous distention. No thyroid enlargement, no tenderness.  LUNGS: Normal breath sounds bilaterally, no wheezing, rales,rhonchi or crepitation. No use of accessory muscles of respiration.  CARDIOVASCULAR: S1, S2 normal. No murmurs, rubs, or gallops.  ABDOMEN: Soft, nontender, nondistended. Bowel sounds present.  EXTREMITIES: Status post right first ray amputation in clean dressing s/p  left great toe amputation no pedal edema, cyanosis, or clubbing.  NEUROLOGIC: awake , alert , oriented  Sensation intact. Gait not checked.  PSYCHIATRIC: The patient is alert and oriented x 3.  SKIN: No obvious rash, lesion, or ulcer.      DATA REVIEW:   CBC Recent Labs  Lab 02/24/18 0922  WBC 14.8*  HGB 9.9*  HCT 32.0*  PLT 190    Chemistries  Recent Labs  Lab 02/22/18 1758 02/23/18 0436  NA 134* 135  K 2.8* 3.2*  CL 96* 98  CO2 24 23  GLUCOSE 199* 121*  BUN 45* 51*  CREATININE 4.91* 5.64*  CALCIUM 7.6* 7.2*  MG  --  1.8  AST 12*  --   ALT 8  --   ALKPHOS 83  --   BILITOT 0.5  --     Cardiac Enzymes No results for input(s): TROPONINI in the last 168 hours.  Microbiology Results  Results for orders placed or performed during the hospital encounter of 02/22/18  MRSA PCR Screening     Status: None   Collection Time: 02/22/18 10:58 PM  Result Value Ref Range Status   MRSA by PCR NEGATIVE  NEGATIVE Final    Comment:        The GeneXpert MRSA Assay (FDA approved for NASAL specimens only), is one component of a comprehensive MRSA colonization surveillance program. It is not intended to diagnose MRSA infection nor to guide or monitor treatment for MRSA infections. Performed at [facility], [address] [place], [place] [address]     RADIOLOGY:  Dg Foot Complete Right  Result Date: 02/22/2018 CLINICAL DATA:  Recent angioplasty.  Cellulitis of the great toe. EXAM: RIGHT FOOT COMPLETE - 3+ VIEW COMPARISON:  None. FINDINGS: There is no evidence of fracture or dislocation. There is plantar calcaneal spur. There is no osteopenia or bony destruction to suggest osteomyelitis. IMPRESSION: No evidence of osteomyelitis of the great toe. Electronically Signed   By: Abelardo Diesel M.D.   On: 02/22/2018 19:40    EKG:   Orders placed or performed during the hospital encounter of 04/14/17  . EKG test  . EKG test      Management plans discussed with the patient, family and they are in agreement.  CODE STATUS:     Code Status Orders  (From admission, onward)         Start     Ordered   02/22/18 2228  Full code  Continuous     02/22/18 2227        Code Status History    Date Active Date Inactive Code Status Order ID Comments User Context   02/09/2017 0146 02/11/2017 1733 Full Code [id number]  Lance Coon, MD Inpatient   01/23/2017 1432 01/31/2017 2032 Full Code [id number]  Nicholes Mango, MD Inpatient   12/30/2016 0042 01/06/2017 2130 Full Code [id number]  Lance Coon, MD Inpatient   02/27/2016 0023 02/28/2016 1606 Full Code [id number]  [place] Glass[place], DO Inpatient   02/08/2016 0028 02/09/2016 1820 Full Code [id number]  Laverle Hobby, MD ED   01/06/2015 0009 01/08/2015 2138 Full Code [id number]  Hubbard Robinson, MD ED      TOTAL TIME TAKING CARE OF THIS PATIENT: 43 minutes.   Note: This dictation was prepared with Dragon dictation along with  smaller phrase technology. Any transcriptional errors that result from this process are unintentional.   @[facility] @  on 02/24/2018 at 11:56 AM  Between 7am to 6pm - Pager - [phone]  After 6pm go to [email or web address] - password [email or web address] [facility]  Office  [phone]  CC: Primary care physician; Martin Majestic, FNP

## 2018-02-24 NOTE — Progress Notes (Signed)
Post HD Treatment  Pt tolerated treatment well. His net UF was 2031 nd BVP was 74.8.Patient complains of pain in his right foot even after administration of medication. Primary RN aware. No other complaints noted. Report called to Wyona Almas    02/24/18 1715  Hand-Off documentation  Report given to (Full Name) Lawson Fiscal, RN  Report received from (Full Name) Sherrine Maples, RN  Vital Signs  Temp 97.8 F (36.6 C)  Temp Source Oral  Pulse Rate 88  Pulse Rate Source Monitor  Resp 19  BP 113/60  BP Location Right Arm  BP Method Automatic  Patient Position (if appropriate) Lying  Oxygen Therapy  SpO2 100 %  O2 Device Nasal Cannula  O2 Flow Rate (L/min) 2 L/min  Pain Assessment  Pain Scale 0-10  Pain Score 6  Pain Type Surgical pain  Pain Location Foot  Pain Orientation Right  Pain Descriptors / Indicators Aching  Pain Frequency Constant  Pain Onset On-going  Patients Stated Pain Goal 3  Pain Intervention(s) RN made aware  Multiple Pain Sites No  Dialysis Weight  Weight 126 kg  Type of Weight Post-Dialysis  Post-Hemodialysis Assessment  Rinseback Volume (mL) 250 mL  KECN 254 V  Dialyzer Clearance Clear  Duration of HD Treatment -hour(s) 3.5 hour(s)  Hemodialysis Intake (mL) 500 mL  UF Total -Machine (mL) 2531 mL  Net UF (mL) 2031 mL  Tolerated HD Treatment Yes  Post-Hemodialysis Comments Pt tolerated treatment well  AVG/AVF Arterial Site Held (minutes) 10 minutes  AVG/AVF Venous Site Held (minutes) 10 minutes  Fistula / Graft Left Upper arm Arteriovenous fistula  Placement Date/Time: 04/21/17 1241   Placed prior to admission: No  Orientation: Left  Access Location: (c) Upper arm  Access Type: Arteriovenous fistula  Site Condition No complications  Fistula / Graft Assessment Present;Thrill;Bruit  Drainage Description None

## 2018-02-24 NOTE — Progress Notes (Signed)
Pharmacy Antibiotic Note  Brian [name] is a 58 y.o. male admitted on 02/22/2018 with cellulitis.  Pharmacy has been consulted for vancomycin dosing. He is post-op day 1 R great toe amputation.  Plan: Patient administered vancomycin 1.5g IV x 1 on 1/2 ESRD on HD MWF w/ 1st HD session today Vancomycin 1000 mg MWF w/HD Goal pre-HD level 15-25 mcg/mL Vt prior to 3rd HD session on 1/8 at this point  Height: 5\' 10"  (177.8 cm) Weight: 277 lb 9 oz (125.9 kg) IBW/kg (Calculated) : 73  Temp (24hrs), Avg:98.3 F (36.8 C), Min:97.4 F (36.3 C), Max:99.5 F (37.5 C)  Recent Labs  Lab 02/22/18 1758 02/23/18 0436  WBC 16.8* 13.9*  CREATININE 4.91* 5.64*    Estimated Creatinine Clearance: 19 mL/min (A) (by C-G formula based on SCr of 5.64 mg/dL (H)).    Thank you for allowing pharmacy to be a part of this patient's care.  Vallery Sa, PharmD Clinical Pharmacist 02/24/2018

## 2018-02-24 NOTE — Anesthesia Postprocedure Evaluation (Signed)
Anesthesia Post Note  Patient: Brian [name]  Procedure(s) Performed: AMPUTATION TOE 1ST RAY (Right Foot)  Patient location during evaluation: PACU Anesthesia Type: General Level of consciousness: awake and alert and oriented Pain management: pain level controlled Vital Signs Assessment: post-procedure vital signs reviewed and stable Respiratory status: spontaneous breathing, nonlabored ventilation and respiratory function stable Cardiovascular status: blood pressure returned to baseline and stable Postop Assessment: no signs of nausea or vomiting Anesthetic complications: no     Last Vitals:  Vitals:   02/23/18 2056 02/24/18 0431  BP: 103/60 (!) 92/59  Pulse: 82 90  Resp: 20 20  Temp: 36.7 C 37 C  SpO2: 100% 95%    Last Pain:  Vitals:   02/24/18 0536  TempSrc:   PainSc: 5                  Lilya Smitherman

## 2018-02-24 NOTE — Progress Notes (Signed)
Pre HD Treatment    02/24/18 1315  Vital Signs  Temp 98.6 F (37 C)  Temp Source Oral  Pulse Rate 74  Pulse Rate Source Monitor  Resp 16  BP (!) 97/59  BP Location Right Arm  BP Method Automatic  Patient Position (if appropriate) Lying  Oxygen Therapy  SpO2 98 %  O2 Device Room Air  Pain Assessment  Pain Scale 0-10  Pain Score 0  Dialysis Weight  Weight 127.4 kg  Type of Weight Pre-Dialysis  Time-Out for Hemodialysis  What Procedure? HD  Pt Identifiers(min of two) First/Last Name;MRN/Account#;Pt's DOB(use if MRN/Acct# not available  Correct Site? Yes  Correct Side? Yes  Correct Procedure? Yes  Consents Verified? Yes  Rad Studies Available? N/A  Safety Precautions Reviewed? Yes  Engineer, civil (consulting) Number 3  Station Number 1  UF/Alarm Test Passed  Conductivity: Meter 14  Conductivity: Machine  13.9  pH 7.3  Reverse Osmosis Main  Normal Saline Lot Number [id number]  Dialyzer Lot Number [id number]  Disposable Set Lot Number [id number]  Machine Temperature 98.6 F (37 C)  Musician and Audible Yes  Blood Lines Intact and Secured Yes  Pre Treatment Patient Checks  Vascular access used during treatment Fistula  Hepatitis B Surface Antigen Results Negative  Date Hepatitis B Surface Antigen Drawn 02/17/18  Hepatitis B Surface Antibody  (>10)  Date Hepatitis B Surface Antibody Drawn 11/21/17  Hemodialysis Consent Verified Yes  Hemodialysis Standing Orders Initiated Yes  ECG (Telemetry) Monitor On Yes  Prime Ordered Normal Saline  Length of  DialysisTreatment -hour(s) 3.5 Hour(s)  Dialysis Treatment Comments Na 140  Dialyzer Elisio 17H NR  Dialysate 3K, 2.5 Ca  Variable Sodium Other (Comment)  Dialysis Anticoagulant None  Dialysate Flow Ordered 600  Blood Flow Rate Ordered 400 mL/min  Ultrafiltration Goal 2 Liters  Pre Treatment Labs Renal panel;CBC  Dialysis Blood Pressure Support Ordered Normal Saline  Education / Care Plan  Dialysis Education  Provided Yes  Documented Education in Care Plan Yes  Note  Observations Patient calm and cooperative  Fistula / Graft Left Upper arm Arteriovenous fistula  Placement Date/Time: 04/21/17 1241   Placed prior to admission: No  Orientation: Left  Access Location: (c) Upper arm  Access Type: Arteriovenous fistula  Site Condition No complications  Fistula / Graft Assessment Present;Thrill;Bruit  Drainage Description None

## 2018-02-24 NOTE — Progress Notes (Signed)
[facility]                                                      Patient Demographics  Brian [name], is a 58 y.o. male   MRN: [id number]   DOB - 07/19/1960  Admit Date - 02/22/2018    Outpatient Primary MD for the patient is Martin Majestic, FNP  Consult requested in the Hospital by Nicholes Mango, MD, On 02/24/2018   With History of -  Past Medical History:  Diagnosis Date  . CHF (congestive heart failure) ([place])   . COPD (chronic obstructive pulmonary disease) ([place])   . Diabetes mellitus type 2 in obese ([place])   . DM (diabetes mellitus) type II controlled with renal manifestation ([place])   . Hypercholesteremia   . Hypertension   . Morbid obesity with BMI of 45.0-49.9, adult ([place])   . Myocardial infarction ([place])    2018 after infection of dialysis catheter  . Neuropathy   . Pancreatitis, acute   . Pneumonia   . Sleep apnea    CPAP  . Spinal stenosis       Past Surgical History:  Procedure Laterality Date  . AMPUTATION    . AMPUTATION TOE Right 02/23/2018   Procedure: AMPUTATION TOE 1ST RAY;  Surgeon: Albertine Patricia, DPM;  Location: [facility] ORS;  Service: Podiatry;  Laterality: Right;  . AV FISTULA PLACEMENT Left 04/21/2017   Procedure: ARTERIOVENOUS (AV) FISTULA CREATION ( BRACHIOCEPHALIC );  Surgeon: Algernon Huxley, MD;  Location: [facility] OR[facility];  Service: Vascular;  Laterality: Left;  . CHOLECYSTECTOMY  1998  . DIALYSIS/PERMA CATHETER INSERTION N/A 01/03/2017   Procedure: DIALYSIS/PERMA CATHETER INSERTION;  Surgeon: Algernon Huxley, MD;  Location: [place] [facility];  Service: Cardiovascular;  Laterality: N/A;  . DIALYSIS/PERMA CATHETER INSERTION N/A 01/31/2017   Procedure: DIALYSIS/PERMA CATHETER INSERTION;  Surgeon: Algernon Huxley, MD;  Location: [place] [facility];  Service: Cardiovascular;  Laterality: N/A;  . DIALYSIS/PERMA CATHETER REMOVAL N/A  01/24/2017   Procedure: DIALYSIS/PERMA CATHETER REMOVAL;  Surgeon: Algernon Huxley, MD;  Location: [place] [facility];  Service: Cardiovascular;  Laterality: N/A;  . DIALYSIS/PERMA CATHETER REMOVAL N/A 08/02/2017   Procedure: DIALYSIS/PERMA CATHETER REMOVAL;  Surgeon: Katha Cabal, MD;  Location: [place] [facility];  Service: Cardiovascular;  Laterality: N/A;  . HERNIA REPAIR     2017 when appendix removed  . LAPAROSCOPIC APPENDECTOMY N/A 01/06/2015   Procedure: APPENDECTOMY LAPAROSCOPIC drainage of peritoneal abcess;  Surgeon: Sherri Rad, MD;  Location: [facility] OR[facility];  Service: General;  Laterality: N/A;  . LOWER EXTREMITY ANGIOGRAPHY Right 02/13/2018   Procedure: LOWER EXTREMITY ANGIOGRAPHY;  Surgeon: Algernon Huxley, MD;  Location: [place] [facility];  Service: Cardiovascular;  Laterality: Right;  . LOWER EXTREMITY ANGIOGRAPHY Right 02/23/2018   Procedure: Lower Extremity Angiography;  Surgeon: Algernon Huxley, MD;  Location: [place] [facility];  Service: Cardiovascular;  Laterality: Right;  . TEE WITHOUT CARDIOVERSION N/A 01/28/2017   Procedure: TRANSESOPHAGEAL ECHOCARDIOGRAM (TEE);  Surgeon: Corey Skains, MD;  Location: [facility];  Service: Cardiovascular;  Laterality: N/A;    in for   Chief Complaint  Patient presents with  . Toe Pain     HPI  Brian [name]  is a 58 y.o. male, 1 day status post first ray amputation right foot secondary  to gangrenous changes to the right great toe with some spread proximally to the first metatarsal.  Social History Social History   Tobacco Use  . Smoking status: Current Every Day Smoker    Packs/day: 0.50    Types: Cigarettes  . Smokeless tobacco: Never Used  Substance Use Topics  . Alcohol use: No    Family History Family History  Problem Relation Age of Onset  . Hypertension Mother   . Hyperlipidemia Mother   . Heart disease Father   . Heart disease Maternal Grandfather     Prior to Admission medications   Medication Sig Start  Date End Date Taking? Authorizing Provider  albuterol (PROVENTIL HFA;VENTOLIN HFA) 108 (90 Base) MCG/ACT inhaler Inhale 2 puffs into the lungs every 4 (four) hours as needed for wheezing or shortness of breath.    Yes [provider]  aspirin EC 81 MG EC tablet Take 1 tablet (81 mg total) by mouth daily. 02/29/16  Yes [name], MD  atorvastatin (LIPITOR) 80 MG tablet Take 80 mg daily by mouth.   Yes [provider]  carvedilol (COREG) 3.125 MG tablet Take 1 tablet (3.125 mg total) by mouth 2 (two) times daily with a meal. 02/11/17  Yes Mody, Sital, MD  cephALEXin (KEFLEX) 500 MG capsule Take 500 mg by mouth 3 (three) times daily.   Yes [provider]  clopidogrel (PLAVIX) 75 MG tablet Take 1 tablet (75 mg total) by mouth daily. 02/13/18  Yes Dew, Erskine Squibb, MD  furosemide (LASIX) 40 MG tablet Take 40 mg by mouth 2 (two) times daily. Take 40 mg by mouth daily on non dialysis days Tuesday, Thursday, Saturday and Sunday   Yes [provider]  gabapentin (NEURONTIN) 300 MG capsule TAKE 1 CAPSULE BY MOUTH ONCE DAILY AT NIGHT 01/15/18  Yes [provider]  ibuprofen (ADVIL,MOTRIN) 200 MG tablet Take 400 mg by mouth daily as needed for headache or moderate pain.    Yes [provider]  levothyroxine (SYNTHROID, LEVOTHROID) 50 MCG tablet Take 50 mcg daily before breakfast by mouth.   Yes [provider]  losartan (COZAAR) 25 MG tablet Take 25 mg by mouth daily.   Yes [provider]  sertraline (ZOLOFT) 100 MG tablet Take 100 mg by mouth daily.   Yes [provider]  sevelamer carbonate (RENVELA) 800 MG tablet Take 800 mg by mouth 3 (three) times daily with meals.   Yes [provider]  acetaminophen (TYLENOL) 325 MG tablet Take 2 tablets (650 mg total) by mouth every 6 (six) hours as needed for mild pain (or Fever >/= 101). 02/24/18   Nicholes Mango, MD  amoxicillin-clavulanate (AUGMENTIN) 500-125 MG tablet Take 1 tablet  (500 mg total) by mouth daily. 02/24/18   Gouru, Illene Silver, MD  bisacodyl (DULCOLAX) 5 MG EC tablet Take 1 tablet (5 mg total) by mouth daily as needed for moderate constipation. 02/24/18   Nicholes Mango, MD  docusate sodium (COLACE) 100 MG capsule Take 1 capsule (100 mg total) by mouth 2 (two) times daily. 02/24/18   Gouru, Illene Silver, MD  midodrine (PROAMATINE) 5 MG tablet Take 1 tablet (5 mg total) by mouth 3 (three) times daily with meals. 02/24/18   Gouru, Illene Silver, MD  ondansetron (ZOFRAN) 4 MG tablet Take 1 tablet (4 mg total) by mouth every 8 (eight) hours as needed for nausea. 02/24/18   Gouru, Illene Silver, MD  oxyCODONE (OXY IR/ROXICODONE) 5 MG immediate release tablet Take 1-2 tablets (5-10 mg total) by mouth  every 6 (six) hours as needed for moderate pain, severe pain or breakthrough pain. 02/24/18   Nicholes Mango, MD    Anti-infectives (From admission, onward)   Start     Dose/Rate Route Frequency Ordered Stop   02/24/18 2200  amoxicillin-clavulanate (AUGMENTIN) 500-125 MG per tablet 500 mg     500 mg Oral Daily at bedtime 02/24/18 1012     02/24/18 1200  vancomycin (VANCOCIN) IVPB 1000 mg/200 mL premix  Status:  Discontinued     1,000 mg 200 mL/hr over 60 Minutes Intravenous Every M-W-F (Hemodialysis) 02/23/18 1120 02/24/18 0950   02/24/18 1200  amoxicillin-clavulanate (AUGMENTIN) 500-125 MG per tablet 500 mg  Status:  Discontinued     500 mg Oral Daily 02/24/18 0939 02/24/18 1012   02/24/18 1000  amoxicillin-clavulanate (AUGMENTIN) 875-125 MG per tablet 1 tablet  Status:  Discontinued     1 tablet Oral Every 12 hours 02/24/18 0936 02/24/18 0939   02/24/18 0000  amoxicillin-clavulanate (AUGMENTIN) 500-125 MG tablet     500 mg Oral Daily 02/24/18 0948     02/23/18 0615  cefTRIAXone (ROCEPHIN) 1 g in sodium chloride 0.9 % 100 mL IVPB  Status:  Discontinued     1 g 200 mL/hr over 30 Minutes Intravenous Every 24 hours 02/23/18 0611 02/24/18 0936   02/23/18 0615  vancomycin (VANCOCIN) 1,500 mg in sodium chloride  0.9 % 500 mL IVPB     1,500 mg 250 mL/hr over 120 Minutes Intravenous  Once 02/23/18 0611 02/23/18 1500   02/22/18 2100  Ampicillin-Sulbactam (UNASYN) 3 g in sodium chloride 0.9 % 100 mL IVPB  Status:  Discontinued     3 g 200 mL/hr over 30 Minutes Intravenous Every 12 hours 02/22/18 2018 02/23/18 0611      Scheduled Meds: . amoxicillin-clavulanate  500 mg Oral QHS  . aspirin EC  81 mg Oral Daily  . atorvastatin  80 mg Oral Daily  . calcium acetate  2,001 mg Oral TID WC  . clopidogrel  75 mg Oral Daily  . docusate sodium  100 mg Oral BID  . gabapentin  300 mg Oral QHS  . heparin  5,000 Units Subcutaneous Q8H  . insulin aspart  0-15 Units Subcutaneous TID WC  . insulin aspart  0-5 Units Subcutaneous QHS  . levothyroxine  50 mcg Oral QAC breakfast  . midodrine  5 mg Oral TID WC  . sertraline  100 mg Oral Daily  . sevelamer carbonate  2,400 mg Oral TID WC   Continuous Infusions: . sodium chloride 250 mL (02/23/18 0752)   PRN Meds:.sodium chloride, acetaminophen **OR** acetaminophen, albuterol, bisacodyl, diphenhydrAMINE-zinc acetate, HYDROcodone-acetaminophen, ondansetron **OR** ondansetron (ZOFRAN) IV, oxyCODONE  Allergies  Allergen Reactions  . Naproxen Rash    Physical Exam  Vitals  Blood pressure (!) 93/56, pulse 72, temperature 98.6 F (37 C), temperature source Oral, resp. rate 20, height 5\' 10"  (1.778 m), weight 125.9 kg, SpO2 93 %.  Lower Extremity exam: Dressing change today.  The incision margins intact there is no redness no purulent drainage no progressing cellulitis.  The color to the medial flap appears to be stable with healthy pink coloration to the region.  Appears to be viable at this point. Data Review  CBC Recent Labs  Lab 02/22/18 1758 02/23/18 0436 02/24/18 0922  WBC 16.8* 13.9* 14.8*  HGB 11.5* 10.2* 9.9*  HCT 36.0* 32.3* 32.0*  PLT 254 219 190  MCV 98.6 99.1 102.6*  MCH 31.5 31.3 31.7  MCHC 31.9 31.6 30.9  RDW 15.7* 15.7* 16.0*   LYMPHSABS 1.1  --   --   MONOABS 0.5  --   --   EOSABS 0.2  --   --   BASOSABS 0.1  --   --    ------------------------------------------------------------------------------------------------------------------  Chemistries  Recent Labs  Lab 02/22/18 1758 02/23/18 0436  NA 134* 135  K 2.8* 3.2*  CL 96* 98  CO2 24 23  GLUCOSE 199* 121*  BUN 45* 51*  CREATININE 4.91* 5.64*  CALCIUM 7.6* 7.2*  MG  --  1.8  AST 12*  --   ALT 8  --   ALKPHOS 83  --   BILITOT 0.5  --    ---------------------------------------------------------------------------------------Assessment & Plan: Overall things foot looks to be stable.  From my perspective is good to go home on oral antibiotic at this point.  Needs to stay off of it as much as possible we did dispense a OrthoWedge postop shoe and he had physical therapy instructions on how to utilize it.  He is to use a walker with ambulation as well.  Dressing stay clean dry and intact and limit see him a week from Monday for reevaluation.  Recommend dressing changes at least twice next week with a dry heavy protective gauze dressing to the region. Active Problems:   Toe necrosis [facility])   Family Communication: Plan discussed with patient   Albertine Patricia M.D on 02/24/2018 at 12:43 PM  Thank you for the consult, we will follow the patient with you in the Hospital.

## 2018-02-24 NOTE — Progress Notes (Signed)
HD Treatment Complete    02/24/18 1700  Vital Signs  Pulse Rate 86  Pulse Rate Source Monitor  Resp 19  BP 113/68  BP Location Right Arm  BP Method Automatic  Patient Position (if appropriate) Lying  Oxygen Therapy  SpO2 100 %  O2 Device Nasal Cannula  O2 Flow Rate (L/min) 2 L/min  During Hemodialysis Assessment  Blood Flow Rate (mL/min) 400 mL/min  Arterial Pressure (mmHg) -250 mmHg  Venous Pressure (mmHg) 290 mmHg  Transmembrane Pressure (mmHg) 60 mmHg  Ultrafiltration Rate (mL/min) 710 mL/min  Dialysate Flow Rate (mL/min) 600 ml/min  Conductivity: Machine  13.9  HD Safety Checks Performed Yes  Intra-Hemodialysis Comments Tolerated well;Tx completed  Fistula / Graft Left Upper arm Arteriovenous fistula  Placement Date/Time: 04/21/17 1241   Placed prior to admission: No  Orientation: Left  Access Location: (c) Upper arm  Access Type: Arteriovenous fistula  Status Deaccessed

## 2018-02-24 NOTE — Progress Notes (Signed)
[facility]  ROUNDING NOTE   Subjective:   Patient for dialysis later today.   Getting boot to help ambulate.   Complains of back itching.    Objective:  Vital signs in last 24 hours:  Temp:  [98 F (36.7 C)-98.6 F (37 C)] 98.6 F (37 C) (01/03 0431) Pulse Rate:  [72-92] 90 (01/03 0431) Resp:  [14-20] 20 (01/03 0431) BP: (86-128)/(49-81) 92/59 (01/03 0431) SpO2:  [81 %-100 %] 95 % (01/03 0431) Weight:  [125.9 kg] 125.9 kg (01/03 0500)  Weight change: -0.6 kg Filed Weights   02/23/18 0830 02/23/18 1151 02/24/18 0500  Weight: 125.5 kg 125.5 kg 125.9 kg    Intake/Output: I/O last 3 completed shifts: In: 1245.4 [P.O.:120; I.V.:351.8; IV Piggyback:773.6] Out: 10 [Blood:10]   Intake/Output this shift:  No intake/output data recorded.  Physical Exam: General: NAD,   Head: Normocephalic, atraumatic. Moist oral mucosal membranes  Eyes: Anicteric, PERRL  Neck: Supple, trachea midline  Lungs:  Clear to auscultation  Heart: Regular rate and rhythm  Abdomen:  Soft, nontender,   Extremities:  no peripheral edema., right foot in dressings  Neurologic: Nonfocal, moving all four extremities  Skin: No lesions  Access: Left AVF    Basic Metabolic Panel: Recent Labs  Lab 02/22/18 1758 02/23/18 0436  NA 134* 135  K 2.8* 3.2*  CL 96* 98  CO2 24 23  GLUCOSE 199* 121*  BUN 45* 51*  CREATININE 4.91* 5.64*  CALCIUM 7.6* 7.2*  MG  --  1.8    Liver Function Tests: Recent Labs  Lab 02/22/18 1758  AST 12*  ALT 8  ALKPHOS 83  BILITOT 0.5  PROT 7.5  ALBUMIN 3.3*   No results for input(s): LIPASE, AMYLASE in the last 168 hours. No results for input(s): AMMONIA in the last 168 hours.  CBC: Recent Labs  Lab 02/22/18 1758 02/23/18 0436 02/24/18 0922  WBC 16.8* 13.9* 14.8*  NEUTROABS 15.0*  --   --   HGB 11.5* 10.2* 9.9*  HCT 36.0* 32.3* 32.0*  MCV 98.6 99.1 102.6*  PLT 254 219 190    Cardiac Enzymes: No results for input(s): CKTOTAL, CKMB,  CKMBINDEX, TROPONINI in the last 168 hours.  BNP: Invalid input(s): POCBNP  CBG: Recent Labs  Lab 02/23/18 1401 02/23/18 1613 02/23/18 2136 02/24/18 0538 02/24/18 0753  GLUCAP 120* 125* 63 121* 118*    Microbiology: Results for orders placed or performed during the hospital encounter of 02/22/18  MRSA PCR Screening     Status: None   Collection Time: 02/22/18 10:58 PM  Result Value Ref Range Status   MRSA by PCR NEGATIVE NEGATIVE Final    Comment:        The GeneXpert MRSA Assay (FDA approved for NASAL specimens only), is one component of a comprehensive MRSA colonization surveillance program. It is not intended to diagnose MRSA infection nor to guide or monitor treatment for MRSA infections. Performed at [facility], [address] [place], [place] [address]     Coagulation Studies: Recent Labs    02/23/18 0436  LABPROT 14.2  INR 1.11    Urinalysis: No results for input(s): COLORURINE, LABSPEC, PHURINE, GLUCOSEU, HGBUR, BILIRUBINUR, KETONESUR, PROTEINUR, UROBILINOGEN, NITRITE, LEUKOCYTESUR in the last 72 hours.  Invalid input(s): APPERANCEUR    Imaging: Dg Foot Complete Right  Result Date: 02/22/2018 CLINICAL DATA:  Recent angioplasty.  Cellulitis of the great toe. EXAM: RIGHT FOOT COMPLETE - 3+ VIEW COMPARISON:  None. FINDINGS: There is no evidence of fracture  or dislocation. There is plantar calcaneal spur. There is no osteopenia or bony destruction to suggest osteomyelitis. IMPRESSION: No evidence of osteomyelitis of the great toe. Electronically Signed   By: Abelardo Diesel M.D.   On: 02/22/2018 19:40     Medications:   . sodium chloride 250 mL (02/23/18 0752)   . amoxicillin-clavulanate  500 mg Oral QHS  . aspirin EC  81 mg Oral Daily  . atorvastatin  80 mg Oral Daily  . calcium acetate  2,001 mg Oral TID WC  . clopidogrel  75 mg Oral Daily  . docusate sodium  100 mg Oral BID  . gabapentin  300 mg Oral QHS  . heparin  5,000 Units  Subcutaneous Q8H  . insulin aspart  0-15 Units Subcutaneous TID WC  . insulin aspart  0-5 Units Subcutaneous QHS  . levothyroxine  50 mcg Oral QAC breakfast  . midodrine  5 mg Oral TID WC  . potassium chloride  40 mEq Oral Once  . sertraline  100 mg Oral Daily  . sevelamer carbonate  2,400 mg Oral TID WC   sodium chloride, acetaminophen **OR** acetaminophen, albuterol, bisacodyl, diphenhydrAMINE-zinc acetate, HYDROcodone-acetaminophen, ondansetron **OR** ondansetron (ZOFRAN) IV, oxyCODONE  Assessment/ Plan:  Brian [name] is a 58 y.o. white male with end stage renal disease on hemodialysis, hypertension, diabetes mellitus type II, coronary artery disease, chronic lower back pain, COPD, tobacco use, congestive heart failure, obstructive sleep apnea admitted to on 02/22/18 for right lower extremity ischemia  [facility] Left AVF 122.5kg  1. End stage renal disease: dialysis for later today.   2. Hypertension: hypotensive. Holding home medications Home regimen of furosemide, losartan and carvedilol.  Started on midodrine.   3. Anemia of chronic kidney disease:   - EPO on HD treatment  4. Secondary Hyperparathyroidism: with hypocalcemia and hyperphosphatemia. Outpatient PTH elevated at 612.  - calcium acetate and sevelamer    LOS: 2 Brian [name] 1/3/202012:00 PM

## 2018-02-24 NOTE — Discharge Instructions (Signed)
Follow-up with primary care physician in 3 to 4 days Continue hemodialysis on Monday Wednesday and Friday and follow-up with nephrology in a week Follow-up with podiatry Dr. Elvina Mattes in a week Follow-up with vascular surgery Dr. Lucky Cowboy in a month

## 2018-02-24 NOTE — Progress Notes (Signed)
HD Treatment Initiated    02/24/18 1326  Vital Signs  Pulse Rate 74  Pulse Rate Source Monitor  Resp 13  Patient Position (if appropriate) Lying  Oxygen Therapy  SpO2 98 %  O2 Device Room Air  During Hemodialysis Assessment  Blood Flow Rate (mL/min) 400 mL/min  Arterial Pressure (mmHg) -200 mmHg  Venous Pressure (mmHg) 220 mmHg  Transmembrane Pressure (mmHg) 60 mmHg  Ultrafiltration Rate (mL/min) 710 mL/min  Dialysate Flow Rate (mL/min) 600 ml/min  Conductivity: Machine  13.9  HD Safety Checks Performed Yes  Dialysis Fluid Bolus Normal Saline  Bolus Amount (mL) 250 mL  Intra-Hemodialysis Comments Tx initiated  Fistula / Graft Left Upper arm Arteriovenous fistula  Placement Date/Time: 04/21/17 1241   Placed prior to admission: No  Orientation: Left  Access Location: (c) Upper arm  Access Type: Arteriovenous fistula  Status Accessed  Needle Size 15

## 2018-02-24 NOTE — Progress Notes (Signed)
Post HD Assessment    02/24/18 1720  Neurological  Level of Consciousness Alert  Orientation Level Oriented X4  Respiratory  Respiratory Pattern Regular;Unlabored;Symmetrical  Chest Assessment Chest expansion symmetrical  Bilateral Breath Sounds Diminished  Cough Non-productive;Weak  Cardiac  Pulse Regular  Heart Sounds S1, S2  Jugular Venous Distention (JVD) No  ECG Monitor Yes  Cardiac Rhythm NSR  Ectopy Unifocal PVC's  Ectopy Frequency Occasional  Antiarrhythmic device No  Vascular  R Dorsalis Pedis Pulse +1  L Dorsalis Pedis Pulse +1  Edema Right lower extremity;Left lower extremity  RLE Edema +1  LLE Edema +1  Integumentary  Integumentary (WDL) X  Skin Color Red  Skin Condition Dry;Flaky  Skin Integrity Surgical Incision (see LDA);Amputation  Musculoskeletal  Musculoskeletal (WDL) X  Generalized Weakness Yes  Gastrointestinal  Bowel Sounds Assessment Active  GU Assessment  Genitourinary (WDL) X (HD pt)  Genitourinary Symptoms Oliguria  Psychosocial  Psychosocial (WDL) WDL  Incision (Closed) 02/23/18 Foot Right  Date First Assessed/Time First Assessed: 02/23/18 0834   Location: Foot  Location Orientation: Right  Site / Wound Assessment Dressing in place / Unable to assess

## 2018-02-26 NOTE — Progress Notes (Deleted)
Patient's Name: Brian [name]  MRN: [id number]  Referring Provider: Martin Majestic, *  DOB: 1960/06/19  PCP: Martin Majestic, FNP  DOS: 02/27/2018  Note by: Gaspar Cola, MD  Service setting: Ambulatory outpatient  Specialty: Interventional Pain Management  Location: [facility] (AMB) Pain Management Facility    Patient type: Established   Primary Reason(s) for Visit: Encounter for post-procedure evaluation of chronic illness with mild to moderate exacerbation CC: No chief complaint on file.  HPI  Brian [name] is a 58 y.o. year old, male patient, who comes today for a post-procedure evaluation. He has Neuropathy ([place]); Depression; HLD (hyperlipidemia); HTN (hypertension); Severe obesity (BMI >= 40) ([place]); Amputated toe ([place]); Diabetes mellitus ([place]); DM (diabetes mellitus) type II controlled with renal manifestation ([place]); COPD (chronic obstructive pulmonary disease) ([place]); Chronic diastolic heart failure ([place]); Tobacco use; Hyperkalemia; Obstructive sleep apnea on CPAP; Lower extremity lymphedema (Bilateral); Acute on chronic diastolic CHF (congestive heart failure) ([place]); NSTEMI (non-ST elevated myocardial infarction) ([place]); ESRD on dialysis ([place]); Fluid overload; Pharmacologic therapy; Disorder of skeletal system; Problems influencing health status; Chronic low back pain (Bilateral) (L>R) w/ sciatica (Bilateral); Chronic hip pain [facility] Area of Pain) (Left); Chronic lower extremity pain (Secondary Area of Pain) (Bilateral) (R>L); Chronic knee pain (Fourth Area of Pain) (Bilateral) (L>R); Lumbar Dextroscoliosis; Lumbar facet hypertrophy; Osteoarthritis of facet joint of lumbar spine; Lumbar facet syndrome (Bilateral); Spondylosis without myelopathy or radiculopathy, lumbosacral region; DDD (degenerative disc disease), lumbosacral; Osteoarthritis of knees (Bilateral); Tricompartment osteoarthritis of knee (Left); Chronic low back pain (Primary Area of Pain) (Bilateral) (R>L); Osteoarthritis  of  AC (acromioclavicular) joint (Left); Class 3 obesity with alveolar hypoventilation without serious comorbidity with body mass index (BMI) of 40.0 to 44.9 in adult [facility]); Chronic pain syndrome; Abnormal MRI, lumbar spine (06/26/2014); 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 His primarily concern today is the No chief complaint on file.  Pain Assessment: Location:     Radiating:   Onset:   Duration:   Quality:   Severity:  /10 (subjective, self-reported pain score)  Note: Reported level is compatible with observation.                         When using our objective Pain Scale, levels between 6 and 10/10 are said to belong in an emergency room, as it progressively worsens from a 6/10, described as severely limiting, requiring emergency care not usually available at an outpatient pain management facility. At a 6/10 level, communication becomes difficult and requires great effort. Assistance to reach the emergency department may be required. Facial flushing and profuse sweating along with potentially dangerous increases in heart rate and blood pressure will be evident. Effect on ADL:   Timing:   Modifying factors:   BP:    HR:    Brian [name] comes in today for post-procedure evaluation.  Further details on both, my assessment(s), as well as the proposed treatment plan, please see below.  Post-Procedure Assessment  01/05/2018 Procedure: Diagnostic bilateral  lumbar facet block#2under fluoro and IV sedation. Pre-procedure pain score:  3/10 Post-procedure pain score: 0/10 (100% relief) Influential Factors: BMI:   Intra-procedural challenges: None observed.         Assessment challenges: None detected.              Reported side-effects: None.        Post-procedural adverse reactions or complications: None reported         Sedation: Sedation provided. When no sedatives are used, the analgesic levels obtained are directly associated to the effectiveness of the local anesthetics. However, when sedation is provided, the level of analgesia obtained during the initial 1 hour following the intervention, is believed to be the result of a combination of factors. These factors may include, but are not limited to: 1. The effectiveness of the local anesthetics used. 2. The effects of the analgesic(s) and/or anxiolytic(s) used. 3. The degree of discomfort experienced by the patient at the time of the procedure. 4. The patients ability and reliability in recalling and recording the events. 5. The presence and influence of possible secondary gains and/or psychosocial factors. Reported result: Relief experienced during the 1st hour after the procedure:   (Ultra-Short Term Relief)            Interpretative annotation: Clinically appropriate result. Analgesia during this period is likely to be Local Anesthetic and/or IV Sedative (Analgesic/Anxiolytic) related.          Effects of local anesthetic: The analgesic effects attained during this period are directly associated to the localized infiltration of local anesthetics and therefore cary significant diagnostic value as to the etiological location, or anatomical origin, of the pain. Expected duration of relief is directly dependent on the pharmacodynamics of the local anesthetic used. Long-acting (4-6 hours) anesthetics used.  Reported result: Relief during the next 4 to 6 hour after the procedure:   (Short-Term  Relief)            Interpretative annotation: Clinically appropriate result. Analgesia during this period is likely to be Local Anesthetic-related.          Long-term benefit: Defined as the period of time past the expected duration of local anesthetics (1 hour for short-acting and 4-6 hours for long-acting). With the possible exception of prolonged sympathetic blockade from the local anesthetics, benefits during this period are typically attributed to, or associated with, other factors such as analgesic sensory neuropraxia, antiinflammatory effects, or beneficial biochemical changes provided by agents other than the local anesthetics.  Reported result: Extended relief following procedure:   (Long-Term Relief)            Interpretative annotation: Clinically possible results. Good relief. No permanent benefit expected. Inflammation plays a part in the etiology to the pain.          Current benefits: Defined as reported results that persistent at this point in time.   Analgesia: *** %            Function: Somewhat improved ROM: Somewhat improved Interpretative annotation: Recurrence of symptoms. No permanent benefit expected. Effective diagnostic intervention.          Interpretation: Results would suggest a successful diagnostic intervention.                  Plan:  Please see "Plan of Care" for details.                Laboratory Chemistry  Inflammation Markers (CRP: Acute Phase) (ESR: Chronic Phase) Lab Results  Component Value Date   CRP 10 10/06/2017   ESRSEDRATE  87 (H) 10/06/2017   LATICACIDVEN 1.5 01/23/2017                         Rheumatology Markers No results found.  Renal Markers Lab Results  Component Value Date   BUN 51 (H) 02/23/2018   CREATININE 5.64 (H) 02/23/2018   BCR 12 10/06/2017   GFRAA 12 (L) 02/23/2018   GFRNONAA 10 (L) 02/23/2018                             Hepatic Markers Lab Results  Component Value Date   AST 12 (L) 02/22/2018   ALT 8 02/22/2018    ALBUMIN 3.3 (L) 02/22/2018                        Neuropathy Markers Lab Results  Component Value Date   VITAMINB12 408 10/06/2017   HGBA1C 6.8 (H) 01/02/2017   HIV Non Reactive 02/23/2018                        Hematology Parameters Lab Results  Component Value Date   INR 1.11 02/23/2018   LABPROT 14.2 02/23/2018   APTT 41 (H) 02/23/2018   PLT 190 02/24/2018   HGB 9.9 (L) 02/24/2018   HCT 32.0 (L) 02/24/2018                        CV Markers Lab Results  Component Value Date   BNP 1,783.0 (H) 02/08/2017   TROPONINI 0.04 ([place]) 02/08/2017                         Note: Lab results reviewed.  Recent Imaging Results   Results for orders placed in visit on 01/05/18  DG C-Arm 1-60 Min-No Report   Narrative Fluoroscopy was utilized by the requesting physician.  No radiographic  interpretation.    Interpretation Report: Fluoroscopy was used during the procedure to assist with needle guidance. The images were interpreted intraoperatively by the requesting physician.  Meds   Current Outpatient Medications:  .  acetaminophen (TYLENOL) 325 MG tablet, Take 2 tablets (650 mg total) by mouth every 6 (six) hours as needed for mild pain (or Fever >/= 101)., Disp: , Rfl:  .  albuterol (PROVENTIL HFA;VENTOLIN HFA) 108 (90 Base) MCG/ACT inhaler, Inhale 2 puffs into the lungs every 4 (four) hours as needed for wheezing or shortness of breath. , Disp: , Rfl:  .  amoxicillin-clavulanate (AUGMENTIN) 500-125 MG tablet, Take 1 tablet (500 mg total) by mouth daily., Disp: 7 tablet, Rfl: 0 .  aspirin EC 81 MG EC tablet, Take 1 tablet (81 mg total) by mouth daily., Disp: 30 tablet, Rfl: 0 .  atorvastatin (LIPITOR) 80 MG tablet, Take 80 mg daily by mouth., Disp: , Rfl:  .  bisacodyl (DULCOLAX) 5 MG EC tablet, Take 1 tablet (5 mg total) by mouth daily as needed for moderate constipation., Disp: 30 tablet, Rfl: 0 .  clopidogrel (PLAVIX) 75 MG tablet, Take 1 tablet (75 mg total) by mouth daily.,  Disp: 30 tablet, Rfl: 11 .  docusate sodium (COLACE) 100 MG capsule, Take 1 capsule (100 mg total) by mouth 2 (two) times daily., Disp: 10 capsule, Rfl: 0 .  gabapentin (NEURONTIN) 300 MG capsule, TAKE 1 CAPSULE BY MOUTH ONCE DAILY AT NIGHT, Disp: , Rfl: 1 .  ibuprofen (ADVIL,MOTRIN) 200 MG tablet, Take 400 mg by mouth daily as needed for headache or moderate pain. , Disp: , Rfl:  .  levothyroxine (SYNTHROID, LEVOTHROID) 50 MCG tablet, Take 50 mcg daily before breakfast by mouth., Disp: , Rfl:  .  midodrine (PROAMATINE) 5 MG tablet, Take 1 tablet (5 mg total) by mouth 3 (three) times daily with meals., Disp: 30 tablet, Rfl: 0 .  ondansetron (ZOFRAN) 4 MG tablet, Take 1 tablet (4 mg total) by mouth every 8 (eight) hours as needed for nausea., Disp: 15 tablet, Rfl: 0 .  oxyCODONE (OXY IR/ROXICODONE) 5 MG immediate release tablet, Take 1-2 tablets (5-10 mg total) by mouth every 6 (six) hours as needed for moderate pain, severe pain or breakthrough pain., Disp: 20 tablet, Rfl: 0 .  sertraline (ZOLOFT) 100 MG tablet, Take 100 mg by mouth daily., Disp: , Rfl:  .  sevelamer carbonate (RENVELA) 800 MG tablet, Take 800 mg by mouth 3 (three) times daily with meals., Disp: , Rfl:   ROS  Constitutional: Denies any fever or chills Gastrointestinal: No reported hemesis, hematochezia, vomiting, or acute GI distress Musculoskeletal: Denies any acute onset joint swelling, redness, loss of ROM, or weakness Neurological: No reported episodes of acute onset apraxia, aphasia, dysarthria, agnosia, amnesia, paralysis, loss of coordination, or loss of consciousness  Allergies  Brian [name] is allergic to naproxen.  PFSH  Drug: Brian [name]  reports no history of drug use. Alcohol:  reports no history of alcohol use. Tobacco:  reports that he has been smoking cigarettes. He has been smoking about 0.50 packs per day. He has never used smokeless tobacco. Medical:  has a past medical history of CHF (congestive heart  failure) ([place]), COPD (chronic obstructive pulmonary disease) ([place]), Diabetes mellitus type 2 in obese ([place]), DM (diabetes mellitus) type II controlled with renal manifestation ([place]), Hypercholesteremia, Hypertension, Morbid obesity with BMI of 45.0-49.9, adult ([place]), Myocardial infarction ([place]), Neuropathy, Pancreatitis, acute, Pneumonia, Sleep apnea, and Spinal stenosis. Surgical: Brian [name]  has a past surgical history that includes Amputation; Cholecystectomy (1998); laparoscopic appendectomy (N/A, 01/06/2015); DIALYSIS/PERMA CATHETER INSERTION (N/A, 01/03/2017); DIALYSIS/PERMA CATHETER REMOVAL (N/A, 01/24/2017); TEE without cardioversion (N/A, 01/28/2017); DIALYSIS/PERMA CATHETER INSERTION (N/A, 01/31/2017); Hernia repair; AV fistula placement (Left, 04/21/2017); DIALYSIS/PERMA CATHETER REMOVAL (N/A, 08/02/2017); Lower Extremity Angiography (Right, 02/13/2018); Lower Extremity Angiography (Right, 02/23/2018); and Amputation toe (Right, 02/23/2018). Family: family history includes Heart disease in his father and maternal grandfather; Hyperlipidemia in his mother; Hypertension in his mother.  Constitutional Exam  General appearance: Well nourished, well developed, and well hydrated. In no apparent acute distress There were no vitals filed for this visit. BMI Assessment: Estimated body mass index is 39.86 kg/m as calculated from the following:   Height as of 02/23/18: '5\' 10"'  (1.778 m).   Weight as of 02/24/18: 277 lb 12.5 oz (126 kg).  BMI interpretation table: BMI level Category Range association with higher incidence of chronic pain  <18 kg/m2 Underweight   18.5-24.9 kg/m2 Ideal body weight   25-29.9 kg/m2 Overweight Increased incidence by 20%  30-34.9 kg/m2 Obese (Class I) Increased incidence by 68%  35-39.9 kg/m2 Severe obesity (Class II) Increased incidence by 136%  >40 kg/m2 Extreme obesity (Class III) Increased incidence by 254%   Patient's current BMI Ideal Body weight  There is no height or  weight on file to calculate BMI. Ideal body weight: 73 kg (160 lb 15 oz) Adjusted ideal body weight: 94.2 kg (207 lb 10.8 oz)   BMI Readings from Last 4  Encounters:  02/24/18 39.86 kg/m  02/13/18 39.46 kg/m  02/09/18 39.03 kg/m  01/05/18 42.09 kg/m   Wt Readings from Last 4 Encounters:  02/24/18 277 lb 12.5 oz (126 kg)  02/13/18 275 lb (124.7 kg)  02/09/18 272 lb (123.4 kg)  01/05/18 285 lb (129.3 kg)  Psych/Mental status: Alert, oriented x 3 (person, place, & time)       Eyes: PERLA Respiratory: No evidence of acute respiratory distress  Cervical Spine Area Exam  Skin & Axial Inspection: No masses, redness, edema, swelling, or associated skin lesions Alignment: Symmetrical Functional ROM: Unrestricted ROM      Stability: No instability detected Muscle Tone/Strength: Functionally intact. No obvious neuro-muscular anomalies detected. Sensory (Neurological): Unimpaired Palpation: No palpable anomalies              Upper Extremity (UE) Exam    Side: Right upper extremity  Side: Left upper extremity  Skin & Extremity Inspection: Skin color, temperature, and hair growth are WNL. No peripheral edema or cyanosis. No masses, redness, swelling, asymmetry, or associated skin lesions. No contractures.  Skin & Extremity Inspection: Skin color, temperature, and hair growth are WNL. No peripheral edema or cyanosis. No masses, redness, swelling, asymmetry, or associated skin lesions. No contractures.  Functional ROM: Unrestricted ROM          Functional ROM: Unrestricted ROM          Muscle Tone/Strength: Functionally intact. No obvious neuro-muscular anomalies detected.  Muscle Tone/Strength: Functionally intact. No obvious neuro-muscular anomalies detected.  Sensory (Neurological): Unimpaired          Sensory (Neurological): Unimpaired          Palpation: No palpable anomalies              Palpation: No palpable anomalies              Provocative Test(s):  Phalen's test: deferred Tinel's  test: deferred Apley's scratch test (touch opposite shoulder):  Action 1 (Across chest): deferred Action 2 (Overhead): deferred Action 3 (LB reach): deferred   Provocative Test(s):  Phalen's test: deferred Tinel's test: deferred Apley's scratch test (touch opposite shoulder):  Action 1 (Across chest): deferred Action 2 (Overhead): deferred Action 3 (LB reach): deferred    Thoracic Spine Area Exam  Skin & Axial Inspection: No masses, redness, or swelling Alignment: Symmetrical Functional ROM: Unrestricted ROM Stability: No instability detected Muscle Tone/Strength: Functionally intact. No obvious neuro-muscular anomalies detected. Sensory (Neurological): Unimpaired Muscle strength & Tone: No palpable anomalies  Lumbar Spine Area Exam  Skin & Axial Inspection: No masses, redness, or swelling Alignment: Symmetrical Functional ROM: Unrestricted ROM       Stability: No instability detected Muscle Tone/Strength: Functionally intact. No obvious neuro-muscular anomalies detected. Sensory (Neurological): Unimpaired Palpation: No palpable anomalies       Provocative Tests: Hyperextension/rotation test: deferred today       Lumbar quadrant test (Kemp's test): deferred today       Lateral bending test: deferred today       Patrick's Maneuver: deferred today                   FABER* test: deferred today                   S-I anterior distraction/compression test: deferred today         S-I lateral compression test: deferred today         S-I Thigh-thrust test: deferred today  S-I Gaenslen's test: deferred today         *(Flexion, ABduction and External Rotation)  Gait & Posture Assessment  Ambulation: Unassisted Gait: Relatively normal for age and body habitus Posture: WNL   Lower Extremity Exam    Side: Right lower extremity  Side: Left lower extremity  Stability: No instability observed          Stability: No instability observed          Skin & Extremity Inspection:  Skin color, temperature, and hair growth are WNL. No peripheral edema or cyanosis. No masses, redness, swelling, asymmetry, or associated skin lesions. No contractures.  Skin & Extremity Inspection: Skin color, temperature, and hair growth are WNL. No peripheral edema or cyanosis. No masses, redness, swelling, asymmetry, or associated skin lesions. No contractures.  Functional ROM: Unrestricted ROM                  Functional ROM: Unrestricted ROM                  Muscle Tone/Strength: Functionally intact. No obvious neuro-muscular anomalies detected.  Muscle Tone/Strength: Functionally intact. No obvious neuro-muscular anomalies detected.  Sensory (Neurological): Unimpaired        Sensory (Neurological): Unimpaired        DTR: Patellar: deferred today Achilles: deferred today Plantar: deferred today  DTR: Patellar: deferred today Achilles: deferred today Plantar: deferred today  Palpation: No palpable anomalies  Palpation: No palpable anomalies   Assessment  Primary Diagnosis & Pertinent Problem List: There were no encounter diagnoses.  Status Diagnosis  Controlled Controlled Controlled No diagnosis found.  Problems updated and reviewed during this visit: No problems updated. Plan of Care  Pharmacotherapy (Medications Ordered): No orders of the defined types were placed in this encounter.  Medications administered today: Clifton James had no medications administered during this visit.  Procedure Orders    No procedure(s) ordered today   Lab Orders  No laboratory test(s) ordered today   Imaging Orders  No imaging studies ordered today   Referral Orders  No referral(s) requested today   Interventional management options: Planned, scheduled, and/or pending:   ***   Considering:   Diagnostic bilateral lumbar facet block Possible bilateral lumbar facet RFA Diagnostic T11-12 thoracic ESI Diagnostic bilateral L2-3 transforaminal ESI Diagnostic bilateral L3-4  transforaminal ESI Diagnostic bilateral L4-5 transforaminal ESI Diagnostic left-sided intra-articular hip joint injection Diagnostic left-sided femoral nerve + obturator nerve block Possible left-sided femoral nerve+obturator nerve RFA Diagnostic bilateral IA knee injection with local anesthetic and steroid Possible series of 5 bilateral intra-articular Hyalgan knee injections Diagnostic bilateral genicular nerve block Possible bilateral genicular nerve RFA   Palliative PRN treatment(s):   None at this time   Provider-requested follow-up: No follow-ups on file.  Future Appointments  Date Time Provider [place]  02/27/2018  9:30 AM Milinda Pointer, MD [facility] None  03/28/2018 10:15 AM Dew, Erskine Squibb, MD [facility] None   Primary Care Physician: Martin Majestic, FNP Location: [facility] Management Facility Note by: Gaspar Cola, MD Date: 02/27/2018; Time: 12:27 AM

## 2018-02-27 ENCOUNTER — Ambulatory Visit: Payer: Medicare Other | Admitting: Pain Medicine

## 2018-03-02 ENCOUNTER — Telehealth: Payer: Self-pay

## 2018-03-02 NOTE — Telephone Encounter (Signed)
EMMI Follow-up: [name] had received an automated call and returned the call to see why we had called.  I explained our process of patients receiving two automated calls post discharge to see how they were doing. I asked how he was doing and he said his foot was sore and tender but to be expected after amputation. Hurts to walk and unsteady but uses walker if he has to get up.  Will pick up wheelchair at [place] this afternoon.  Has one follow-up appointment today at 3 pm, one with Dr. Elvina Mattes on Jan. 14th and one with Dr. Lucky Cowboy coming up.  Dialysis staff volunteered to change bandage if he brings his supplies and that has been a great help. He did get his Rx's filled and knows it will take time to heal. I let him know he would receive the second automated call in a day or two and to let us know if he has any concerns at that time.

## 2018-03-07 ENCOUNTER — Encounter (INDEPENDENT_AMBULATORY_CARE_PROVIDER_SITE_OTHER): Payer: Medicare Other

## 2018-03-07 ENCOUNTER — Ambulatory Visit (INDEPENDENT_AMBULATORY_CARE_PROVIDER_SITE_OTHER): Payer: Medicare Other | Admitting: Nurse Practitioner

## 2018-03-13 ENCOUNTER — Ambulatory Visit: Payer: Medicare Other | Admitting: Pain Medicine

## 2018-03-13 DIAGNOSIS — Z7901 Long term (current) use of anticoagulants: Secondary | ICD-10-CM | POA: Insufficient documentation

## 2018-03-13 NOTE — Progress Notes (Deleted)
Patient's Name: Brian [name]  MRN: [id number]  Referring Provider: Martin [name], *  DOB: 1960/11/27  PCP: Brian Majestic, FNP  DOS: 03/13/2018  Note by: Brian Cola, MD  Service setting: Ambulatory outpatient  Specialty: Interventional Pain Management  Location: [facility] (AMB) Pain Management Facility    Patient type: Established   Primary Reason(s) for Visit: Encounter for post-procedure evaluation of chronic illness with mild to moderate exacerbation CC: No chief complaint on file.  HPI  Brian [name] is a 58 y.o. year old, male patient, who comes today for a post-procedure evaluation. He has Neuropathy ([place]); Depression; HLD (hyperlipidemia); HTN (hypertension); Severe obesity (BMI >= 40) ([place]); Amputated toe ([place]); Diabetes mellitus ([place]); DM (diabetes mellitus) type II controlled with renal manifestation ([place]); COPD (chronic obstructive pulmonary disease) ([place]); Chronic diastolic heart failure ([place]); Tobacco use; Hyperkalemia; Obstructive sleep apnea on CPAP; Lower extremity lymphedema (Bilateral); Acute on chronic diastolic CHF (congestive heart failure) ([place]); NSTEMI (non-ST elevated myocardial infarction) ([place]); ESRD on dialysis ([place]); Fluid overload; Pharmacologic therapy; Disorder of skeletal system; Problems influencing health status; Chronic low back pain (Bilateral) (L>R) w/ sciatica (Bilateral); Chronic hip pain [facility] Area of Pain) (Left); Chronic lower extremity pain (Secondary Area of Pain) (Bilateral) (R>L); Chronic knee pain (Fourth Area of Pain) (Bilateral) (L>R); Lumbar Dextroscoliosis; Lumbar facet hypertrophy; Osteoarthritis of facet joint of lumbar spine; Lumbar facet syndrome (Bilateral); Spondylosis without myelopathy or radiculopathy, lumbosacral region; DDD (degenerative disc disease), lumbosacral; Osteoarthritis of knees (Bilateral); Tricompartment osteoarthritis of knee (Left); Chronic low back pain (Primary Area of Pain) (Bilateral) (R>L); Osteoarthritis  of  AC (acromioclavicular) joint (Left); Class 3 obesity with alveolar hypoventilation without serious comorbidity with body mass index (BMI) of 40.0 to 44.9 in adult [facility]); Chronic pain syndrome; Abnormal MRI, lumbar spine (06/26/2014); 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 His primarily concern today is the No chief complaint on file.  Pain Assessment: Location:     Radiating:   Onset:   Duration:   Quality:   Severity:  /10 (subjective, self-reported pain score)  Note: Reported level is compatible with observation.                         When using our objective Pain Scale, levels between 6 and 10/10 are said to belong in an emergency room, as it progressively worsens from a 6/10, described as severely limiting, requiring emergency care not usually available at an outpatient pain management facility. At a 6/10 level, communication becomes difficult and requires great effort. Assistance to reach the emergency department may be required. Facial flushing and profuse sweating along with potentially dangerous increases in heart rate and blood pressure will be evident. Effect on ADL:   Timing:   Modifying factors:   BP:    HR:    Brian [name] comes in today for post-procedure evaluation.  Further details on both, my assessment(s), as well as the proposed treatment plan, please see below.  Post-Procedure Assessment  01/05/2018  Procedure: Diagnostic bilateral lumbar facet block #2 under fluoroscopic guidance and  IV sedation Pre-procedure pain score:  3/10 Post-procedure pain score: 0/10 (100% relief) Influential Factors: BMI:   Intra-procedural challenges: None observed.         Assessment challenges: None detected.              Reported side-effects: None.        Post-procedural adverse reactions or complications: None reported         Sedation: Sedation provided. When no sedatives are used, the analgesic levels obtained are directly associated to the effectiveness of the local anesthetics. However, when sedation is provided, the level of analgesia obtained during the initial 1 hour following the intervention, is believed to be the result of a combination of factors. These factors may include, but are not limited to: 1. The effectiveness of the local anesthetics used. 2. The effects of the analgesic(s) and/or anxiolytic(s) used. 3. The degree of discomfort experienced by the patient at the time of the procedure. 4. The patients ability and reliability in recalling and recording the events. 5. The presence and influence of possible secondary gains and/or psychosocial factors. Reported result: Relief experienced during the 1st hour after the procedure:   (Ultra-Short Term Relief)            Interpretative annotation: Clinically appropriate result. Analgesia during this period is likely to be Local Anesthetic and/or IV Sedative (Analgesic/Anxiolytic) related.          Effects of local anesthetic: The analgesic effects attained during this period are directly associated to the localized infiltration of local anesthetics and therefore cary significant diagnostic value as to the etiological location, or anatomical origin, of the pain. Expected duration of relief is directly dependent on the pharmacodynamics of the local anesthetic used. Long-acting (4-6 hours) anesthetics used.  Reported result: Relief during the next 4 to 6  hour after the procedure:   (Short-Term Relief)            Interpretative annotation: Clinically appropriate result. Analgesia during this period is likely to be Local Anesthetic-related.          Long-term benefit: Defined as the period of time past the expected duration of local anesthetics (1 hour for short-acting and 4-6 hours for long-acting). With the possible exception of prolonged sympathetic blockade from the local anesthetics, benefits during this period are typically attributed to, or associated with, other factors such as analgesic sensory neuropraxia, antiinflammatory effects, or beneficial biochemical changes provided by agents other than the local anesthetics.  Reported result: Extended relief following procedure:   (Long-Term Relief)            Interpretative annotation: Clinically possible results. Good relief. No permanent benefit expected. Inflammation plays a part in the etiology to the pain.          Current benefits: Defined as reported results that persistent at this point in time.   Analgesia: *** %            Function: Somewhat improved ROM: Somewhat improved Interpretative annotation: Recurrence of symptoms. No permanent benefit expected. Effective diagnostic intervention.          Interpretation: Results would suggest a successful diagnostic intervention.                  Plan:  Please see "Plan of Care" for details.                Laboratory Chemistry  Inflammation Markers (CRP: Acute Phase) (ESR: Chronic Phase) Lab Results  Component Value Date  CRP 10 10/06/2017   ESRSEDRATE 87 (H) 10/06/2017   LATICACIDVEN 1.5 01/23/2017                         Rheumatology Markers No results found.  Renal Markers Lab Results  Component Value Date   BUN 51 (H) 02/23/2018   CREATININE 5.64 (H) 02/23/2018   BCR 12 10/06/2017   GFRAA 12 (L) 02/23/2018   GFRNONAA 10 (L) 02/23/2018                             Hepatic Markers Lab Results  Component Value Date   AST  12 (L) 02/22/2018   ALT 8 02/22/2018   ALBUMIN 3.3 (L) 02/22/2018                        Neuropathy Markers Lab Results  Component Value Date   VITAMINB12 408 10/06/2017   HGBA1C 6.8 (H) 01/02/2017   HIV Non Reactive 02/23/2018                        Hematology Parameters Lab Results  Component Value Date   INR 1.11 02/23/2018   LABPROT 14.2 02/23/2018   APTT 41 (H) 02/23/2018   PLT 190 02/24/2018   HGB 9.9 (L) 02/24/2018   HCT 32.0 (L) 02/24/2018                        CV Markers Lab Results  Component Value Date   BNP 1,783.0 (H) 02/08/2017   TROPONINI 0.04 ([place]) 02/08/2017                         Note: Lab results reviewed.  Recent Imaging Results   Results for orders placed in visit on 01/05/18  DG C-Arm 1-60 Min-No Report   Narrative Fluoroscopy was utilized by the requesting physician.  No radiographic  interpretation.    Interpretation Report: Fluoroscopy was used during the procedure to assist with needle guidance. The images were interpreted intraoperatively by the requesting physician.  Meds   Current Outpatient Medications:  .  acetaminophen (TYLENOL) 325 MG tablet, Take 2 tablets (650 mg total) by mouth every 6 (six) hours as needed for mild pain (or Fever >/= 101)., Disp: , Rfl:  .  albuterol (PROVENTIL HFA;VENTOLIN HFA) 108 (90 Base) MCG/ACT inhaler, Inhale 2 puffs into the lungs every 4 (four) hours as needed for wheezing or shortness of breath. , Disp: , Rfl:  .  amoxicillin-clavulanate (AUGMENTIN) 500-125 MG tablet, Take 1 tablet (500 mg total) by mouth daily., Disp: 7 tablet, Rfl: 0 .  aspirin EC 81 MG EC tablet, Take 1 tablet (81 mg total) by mouth daily., Disp: 30 tablet, Rfl: 0 .  atorvastatin (LIPITOR) 80 MG tablet, Take 80 mg daily by mouth., Disp: , Rfl:  .  bisacodyl (DULCOLAX) 5 MG EC tablet, Take 1 tablet (5 mg total) by mouth daily as needed for moderate constipation., Disp: 30 tablet, Rfl: 0 .  clopidogrel (PLAVIX) 75 MG tablet, Take 1  tablet (75 mg total) by mouth daily., Disp: 30 tablet, Rfl: 11 .  docusate sodium (COLACE) 100 MG capsule, Take 1 capsule (100 mg total) by mouth 2 (two) times daily., Disp: 10 capsule, Rfl: 0 .  gabapentin (NEURONTIN) 300 MG capsule, TAKE 1 CAPSULE BY MOUTH ONCE DAILY  AT NIGHT, Disp: , Rfl: 1 .  ibuprofen (ADVIL,MOTRIN) 200 MG tablet, Take 400 mg by mouth daily as needed for headache or moderate pain. , Disp: , Rfl:  .  levothyroxine (SYNTHROID, LEVOTHROID) 50 MCG tablet, Take 50 mcg daily before breakfast by mouth., Disp: , Rfl:  .  midodrine (PROAMATINE) 5 MG tablet, Take 1 tablet (5 mg total) by mouth 3 (three) times daily with meals., Disp: 30 tablet, Rfl: 0 .  ondansetron (ZOFRAN) 4 MG tablet, Take 1 tablet (4 mg total) by mouth every 8 (eight) hours as needed for nausea., Disp: 15 tablet, Rfl: 0 .  oxyCODONE (OXY IR/ROXICODONE) 5 MG immediate release tablet, Take 1-2 tablets (5-10 mg total) by mouth every 6 (six) hours as needed for moderate pain, severe pain or breakthrough pain., Disp: 20 tablet, Rfl: 0 .  sertraline (ZOLOFT) 100 MG tablet, Take 100 mg by mouth daily., Disp: , Rfl:  .  sevelamer carbonate (RENVELA) 800 MG tablet, Take 800 mg by mouth 3 (three) times daily with meals., Disp: , Rfl:   ROS  Constitutional: Denies any fever or chills Gastrointestinal: No reported hemesis, hematochezia, vomiting, or acute GI distress Musculoskeletal: Denies any acute onset joint swelling, redness, loss of ROM, or weakness Neurological: No reported episodes of acute onset apraxia, aphasia, dysarthria, agnosia, amnesia, paralysis, loss of coordination, or loss of consciousness  Allergies  Mr. Goedken is allergic to naproxen.  PFSH  Drug: Mr. Rue  reports no history of drug use. Alcohol:  reports no history of alcohol use. Tobacco:  reports that he has been smoking cigarettes. He has been smoking about 0.50 packs per day. He has never used smokeless tobacco. Medical:  has a past medical  history of CHF (congestive heart failure) ([place]), COPD (chronic obstructive pulmonary disease) ([place]), Diabetes mellitus type 2 in obese ([place]), DM (diabetes mellitus) type II controlled with renal manifestation ([place]), Hypercholesteremia, Hypertension, Morbid obesity with BMI of 45.0-49.9, adult ([place]), Myocardial infarction ([place]), Neuropathy, Pancreatitis, acute, Pneumonia, Sleep apnea, and Spinal stenosis. Surgical: Mr. Prichett  has a past surgical history that includes Amputation; Cholecystectomy (1998); laparoscopic appendectomy (N/A, 01/06/2015); DIALYSIS/PERMA CATHETER INSERTION (N/A, 01/03/2017); DIALYSIS/PERMA CATHETER REMOVAL (N/A, 01/24/2017); TEE without cardioversion (N/A, 01/28/2017); DIALYSIS/PERMA CATHETER INSERTION (N/A, 01/31/2017); Hernia repair; AV fistula placement (Left, 04/21/2017); DIALYSIS/PERMA CATHETER REMOVAL (N/A, 08/02/2017); Lower Extremity Angiography (Right, 02/13/2018); Lower Extremity Angiography (Right, 02/23/2018); and Amputation toe (Right, 02/23/2018). Family: family history includes Heart disease in his father and maternal grandfather; Hyperlipidemia in his mother; Hypertension in his mother.  Constitutional Exam  General appearance: Well nourished, well developed, and well hydrated. In no apparent acute distress There were no vitals filed for this visit. BMI Assessment: Estimated body mass index is 39.86 kg/m as calculated from the following:   Height as of 02/23/18: _0  (1.778 m).   Weight as of 02/24/18: 277 lb 12.5 oz (126 kg).  BMI interpretation table: BMI level Category Range association with higher incidence of chronic pain  <18 kg/m2 Underweight   18.5-24.9 kg/m2 Ideal body weight   25-29.9 kg/m2 Overweight Increased incidence by 20%  30-34.9 kg/m2 Obese (Class I) Increased incidence by 68%  35-39.9 kg/m2 Severe obesity (Class II) Increased incidence by 136%  >40 kg/m2 Extreme obesity (Class III) Increased incidence by 254%   Patient's current BMI Ideal Body  weight  There is no height or weight on file to calculate BMI. Patient weight not recorded   BMI Readings from Last 4 Encounters:  02/24/18 39.86 kg/m  02/13/18  39.46 kg/m  02/09/18 39.03 kg/m  01/05/18 42.09 kg/m   Wt Readings from Last 4 Encounters:  02/24/18 277 lb 12.5 oz (126 kg)  02/13/18 275 lb (124.7 kg)  02/09/18 272 lb (123.4 kg)  01/05/18 285 lb (129.3 kg)  Psych/Mental status: Alert, oriented x 3 (person, place, & time)       Eyes: PERLA Respiratory: No evidence of acute respiratory distress  Cervical Spine Area Exam  Skin & Axial Inspection: No masses, redness, edema, swelling, or associated skin lesions Alignment: Symmetrical Functional ROM: Unrestricted ROM      Stability: No instability detected Muscle Tone/Strength: Functionally intact. No obvious neuro-muscular anomalies detected. Sensory (Neurological): Unimpaired Palpation: No palpable anomalies              Upper Extremity (UE) Exam    Side: Right upper extremity  Side: Left upper extremity  Skin & Extremity Inspection: Skin color, temperature, and hair growth are WNL. No peripheral edema or cyanosis. No masses, redness, swelling, asymmetry, or associated skin lesions. No contractures.  Skin & Extremity Inspection: Skin color, temperature, and hair growth are WNL. No peripheral edema or cyanosis. No masses, redness, swelling, asymmetry, or associated skin lesions. No contractures.  Functional ROM: Unrestricted ROM          Functional ROM: Unrestricted ROM          Muscle Tone/Strength: Functionally intact. No obvious neuro-muscular anomalies detected.  Muscle Tone/Strength: Functionally intact. No obvious neuro-muscular anomalies detected.  Sensory (Neurological): Unimpaired          Sensory (Neurological): Unimpaired          Palpation: No palpable anomalies              Palpation: No palpable anomalies              Provocative Test(s):  Phalen's test: deferred Tinel's test: deferred Apley's scratch test  (touch opposite shoulder):  Action 1 (Across chest): deferred Action 2 (Overhead): deferred Action 3 (LB reach): deferred   Provocative Test(s):  Phalen's test: deferred Tinel's test: deferred Apley's scratch test (touch opposite shoulder):  Action 1 (Across chest): deferred Action 2 (Overhead): deferred Action 3 (LB reach): deferred    Thoracic Spine Area Exam  Skin & Axial Inspection: No masses, redness, or swelling Alignment: Symmetrical Functional ROM: Unrestricted ROM Stability: No instability detected Muscle Tone/Strength: Functionally intact. No obvious neuro-muscular anomalies detected. Sensory (Neurological): Unimpaired Muscle strength & Tone: No palpable anomalies  Lumbar Spine Area Exam  Skin & Axial Inspection: No masses, redness, or swelling Alignment: Symmetrical Functional ROM: Unrestricted ROM       Stability: No instability detected Muscle Tone/Strength: Functionally intact. No obvious neuro-muscular anomalies detected. Sensory (Neurological): Unimpaired Palpation: No palpable anomalies       Provocative Tests: Hyperextension/rotation test: deferred today       Lumbar quadrant test (Kemp's test): deferred today       Lateral bending test: deferred today       Patrick's Maneuver: deferred today                   FABER* test: deferred today                   S-I anterior distraction/compression test: deferred today         S-I lateral compression test: deferred today         S-I Thigh-thrust test: deferred today         S-I Gaenslen's test: deferred today         *(  Flexion, ABduction and External Rotation)  Gait & Posture Assessment  Ambulation: Unassisted Gait: Relatively normal for age and body habitus Posture: WNL   Lower Extremity Exam    Side: Right lower extremity  Side: Left lower extremity  Stability: No instability observed          Stability: No instability observed          Skin & Extremity Inspection: Skin color, temperature, and hair  growth are WNL. No peripheral edema or cyanosis. No masses, redness, swelling, asymmetry, or associated skin lesions. No contractures.  Skin & Extremity Inspection: Skin color, temperature, and hair growth are WNL. No peripheral edema or cyanosis. No masses, redness, swelling, asymmetry, or associated skin lesions. No contractures.  Functional ROM: Unrestricted ROM                  Functional ROM: Unrestricted ROM                  Muscle Tone/Strength: Functionally intact. No obvious neuro-muscular anomalies detected.  Muscle Tone/Strength: Functionally intact. No obvious neuro-muscular anomalies detected.  Sensory (Neurological): Unimpaired        Sensory (Neurological): Unimpaired        DTR: Patellar: deferred today Achilles: deferred today Plantar: deferred today  DTR: Patellar: deferred today Achilles: deferred today Plantar: deferred today  Palpation: No palpable anomalies  Palpation: No palpable anomalies   Assessment  Primary Diagnosis & Pertinent Problem List: The primary encounter diagnosis was Chronic low back pain (Primary Area of Pain) (Bilateral) (R>L). Diagnoses of Chronic lower extremity pain (Secondary Area of Pain) (Bilateral) (R>L), Lumbar facet syndrome (Bilateral), Chronic hip pain (Tertiary Area of Pain) (Left), Chronic knee pain (Fourth Area of Pain) (Bilateral) (L>R), DDD (degenerative disc disease), lumbosacral, Class 3 obesity with alveolar hypoventilation without serious comorbidity with body mass index (BMI) of 40.0 to 44.9 in adult [facility]), Chronic anticoagulation (PLAVIX), Problems influencing health status, and Tobacco use were also pertinent to this visit.  Status Diagnosis  Persistent Persistent Persistent 1. Chronic low back pain (Primary Area of Pain) (Bilateral) (R>L)   2. Chronic lower extremity pain (Secondary Area of Pain) (Bilateral) (R>L)   3. Lumbar facet syndrome (Bilateral)   4. Chronic hip pain [facility] Area of Pain) (Left)   5. Chronic knee pain  (Fourth Area of Pain) (Bilateral) (L>R)   6. DDD (degenerative disc disease), lumbosacral   7. Class 3 obesity with alveolar hypoventilation without serious comorbidity with body mass index (BMI) of 40.0 to 44.9 in adult ([place])   8. Chronic anticoagulation (PLAVIX)   9. Problems influencing health status   10. Tobacco use     Problems updated and reviewed during this visit: Problem  Chronic anticoagulation (PLAVIX)   STOP: 7-10 days prior to procedures. RESTART: 2 hours after procedure.    Plan of Care  Pharmacotherapy (Medications Ordered): No orders of the defined types were placed in this encounter.  Medications administered today: Clifton James had no medications administered during this visit.  Procedure Orders    No procedure(s) ordered today   Lab Orders  No laboratory test(s) ordered today   Imaging Orders  No imaging studies ordered today   Referral Orders  No referral(s) requested today   Interventional management options: Planned, scheduled, and/or pending:   NOTE: PLAVIX ANTICOAGULATION. (Stop: 7-10 days pre-procedure. Restart: 2 hours after) 1. Poor candidate for pharmacotherapy secondary to end-stage renal disease on dialysis,sleep apnea, tobacco abuse, and COPD. In addition, technical difficulties monitoring use of  opioid analgesics secondary to anuria. 2. High risk candidate for interventional therapies using steroids due to DM. 3. High risk candidate for interventional therapies secondary to Plavix anticoagulation. 4. Correct vitamin D insufficiency.  5. BMI needs to be brought down below 35 before radiofrequency ablation can be performed..   Palliative bilateral lumbar facet block#3under fluoro and IV sedation.    Considering:   Diagnostic bilateral lumbar facet block Possible bilateral lumbar facet RFA Diagnostic T11-12 thoracic ESI Diagnostic bilateral L2-3 transforaminal ESI Diagnostic bilateral L3-4 transforaminal ESI Diagnostic  bilateral L4-5 transforaminal ESI Diagnostic left-sided intra-articular hip joint injection Diagnostic left-sided femoral nerve + obturator nerve block Possible left-sided femoral nerve+obturator nerve RFA Diagnostic bilateral intra-articular knee injection with local anesthetic and steroid Possible series of 5 bilateral intra-articular Hyalgan knee injections Diagnostic bilateral genicular nerve block Possible bilateral genicular nerve RFA   Palliative PRN treatment(s):   Palliative/therapeutic bilateral lumbar facet blocks    Provider-requested follow-up: No follow-ups on file.  Future Appointments  Date Time Provider [place]  03/13/2018 10:15 AM Milinda Pointer, MD [facility] None  03/28/2018 10:15 AM Lucky Cowboy, Erskine Squibb, MD [facility] None   Primary Care Physician: Brian Majestic, FNP Location: [facility] Outpatient Pain Management Facility Note by: Brian Cola, MD Date: 03/13/2018; Time: 5:45 AM

## 2018-03-25 DEATH — deceased

## 2018-03-28 ENCOUNTER — Ambulatory Visit (INDEPENDENT_AMBULATORY_CARE_PROVIDER_SITE_OTHER): Payer: Medicare Other | Admitting: Vascular Surgery

## 2018-04-14 IMAGING — DX DG CHEST 1V
1 series · 2 of 2 positions shown · non-contrast
Comparison: 01/23/2017 and prior radiographs

CLINICAL DATA: Central line placement.

EXAM:
CHEST 1 VIEW

[Series 1: chest ap · 0.14mm/px · 2 of 2 slices shown]
[im 1/2]
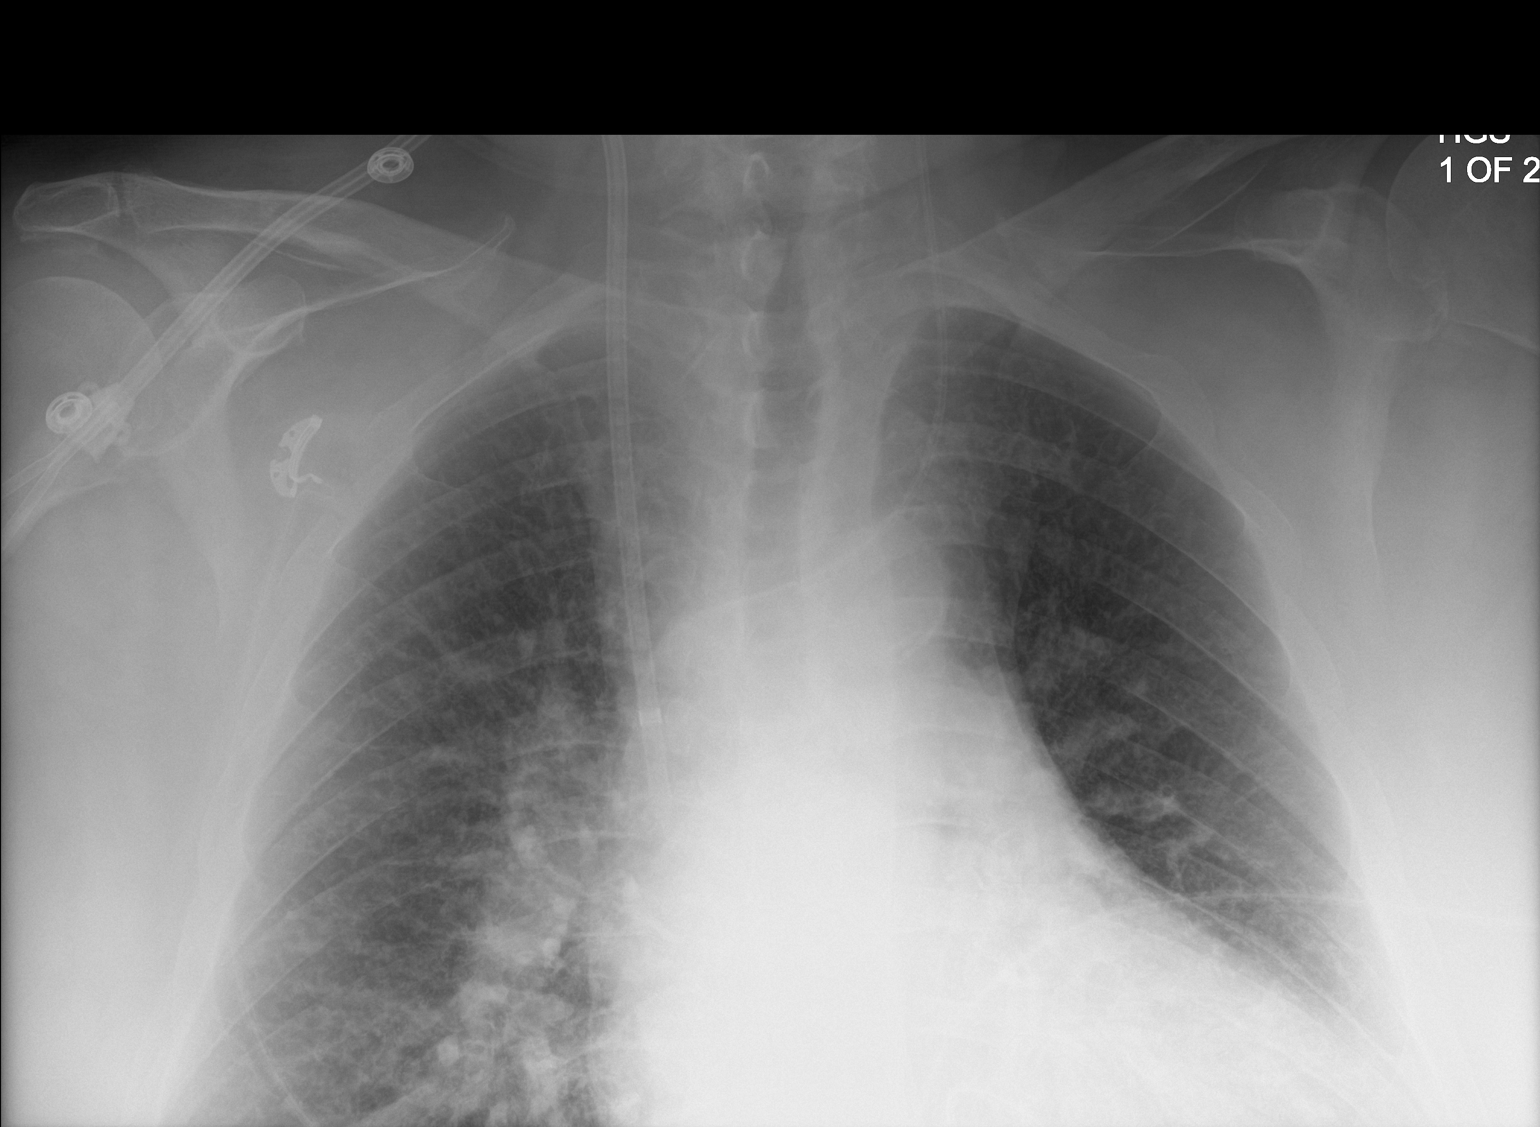
[im 2/2]
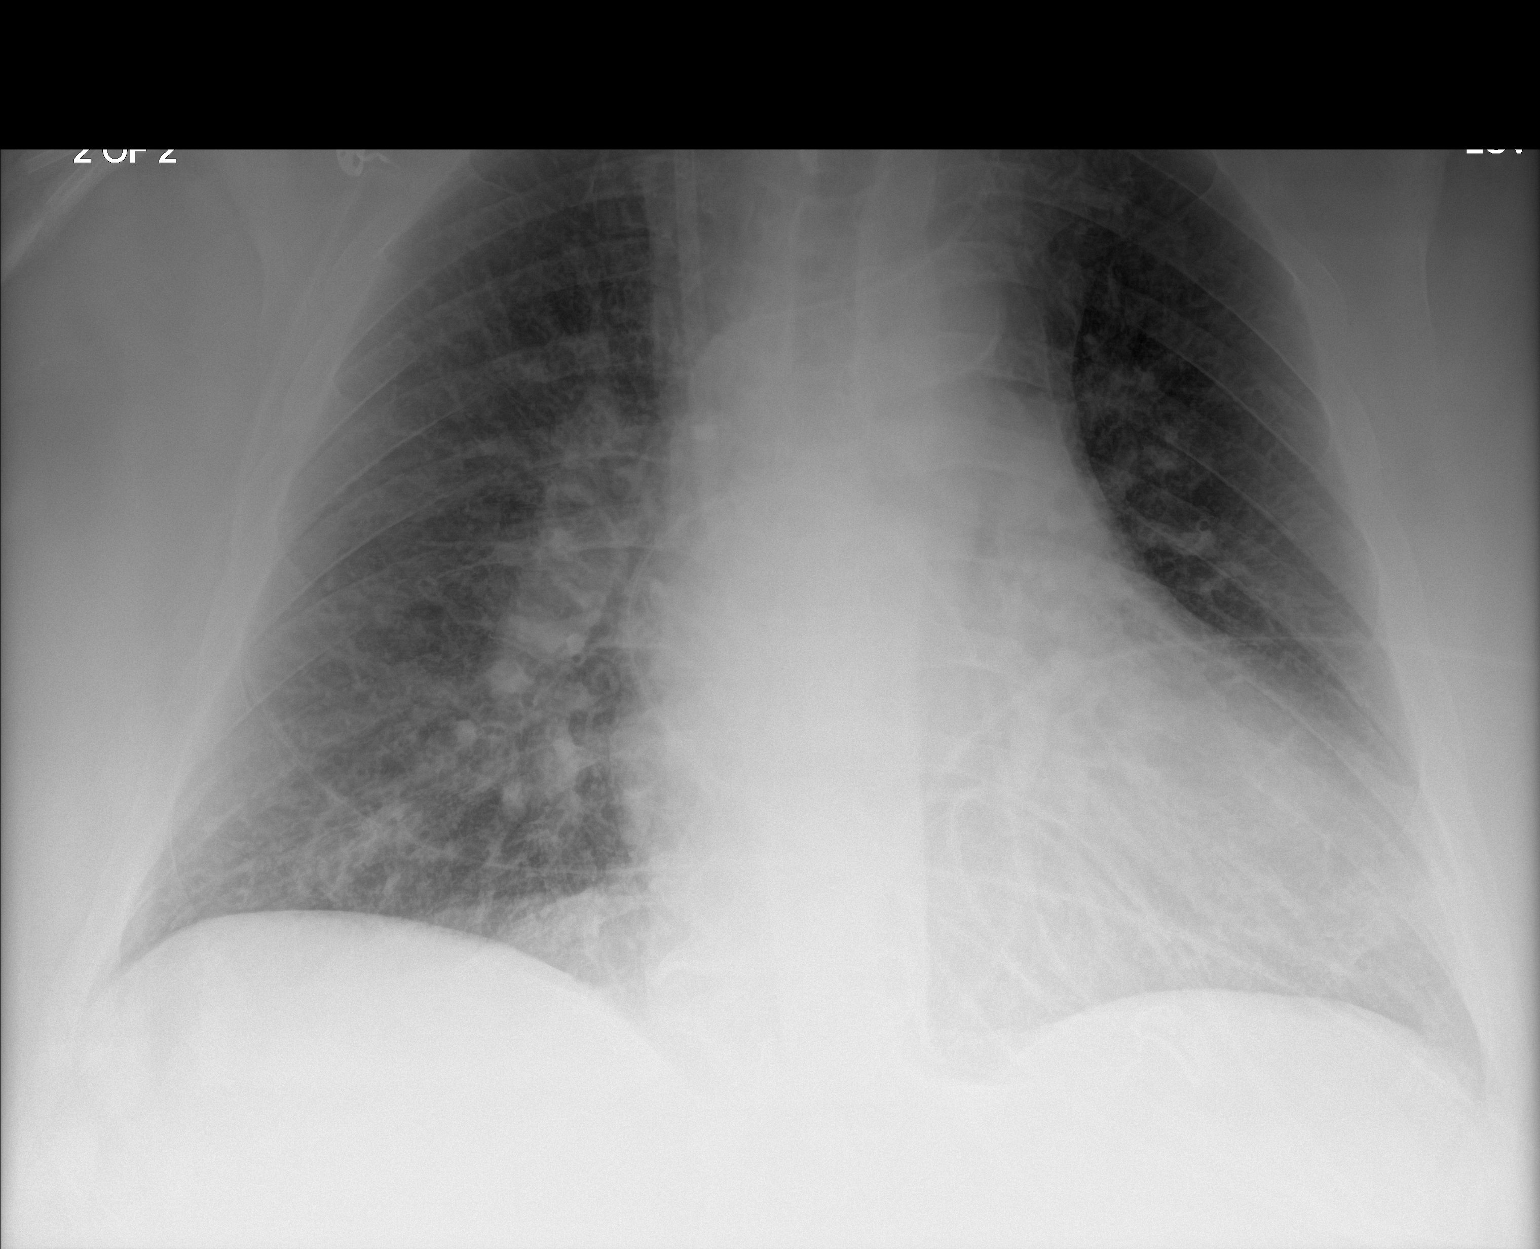

[2 of 2 positions shown; findings below may reference images not displayed]

FINDINGS: A left IJ central venous catheter is now noted with tip overlying
upper SVC.

A right IJ central venous catheter is again noted with tip overlying
the mid SVC.

Cardiomegaly and pulmonary vascular congestion again identified.

Slight decrease in interstitial opacities/edema noted.

There is no evidence of pneumothorax.
IMPRESSION: Left IJ central venous catheter placement with tip overlying the
upper SVC. No evidence of pneumothorax.

Slight decrease in interstitial opacities/edema.

Cardiomegaly and pulmonary vascular congestion.

## 2018-04-16 IMAGING — DX DG CHEST 1V
1 series · 2 of 2 positions shown · non-contrast
Comparison: Portable chest x-ray of 01/23/2017

CLINICAL DATA: Central line placed

EXAM:
CHEST 1 VIEW

[Series 1: chest ap · 0.14mm/px · 2 of 2 slices shown]
[im 1/2]
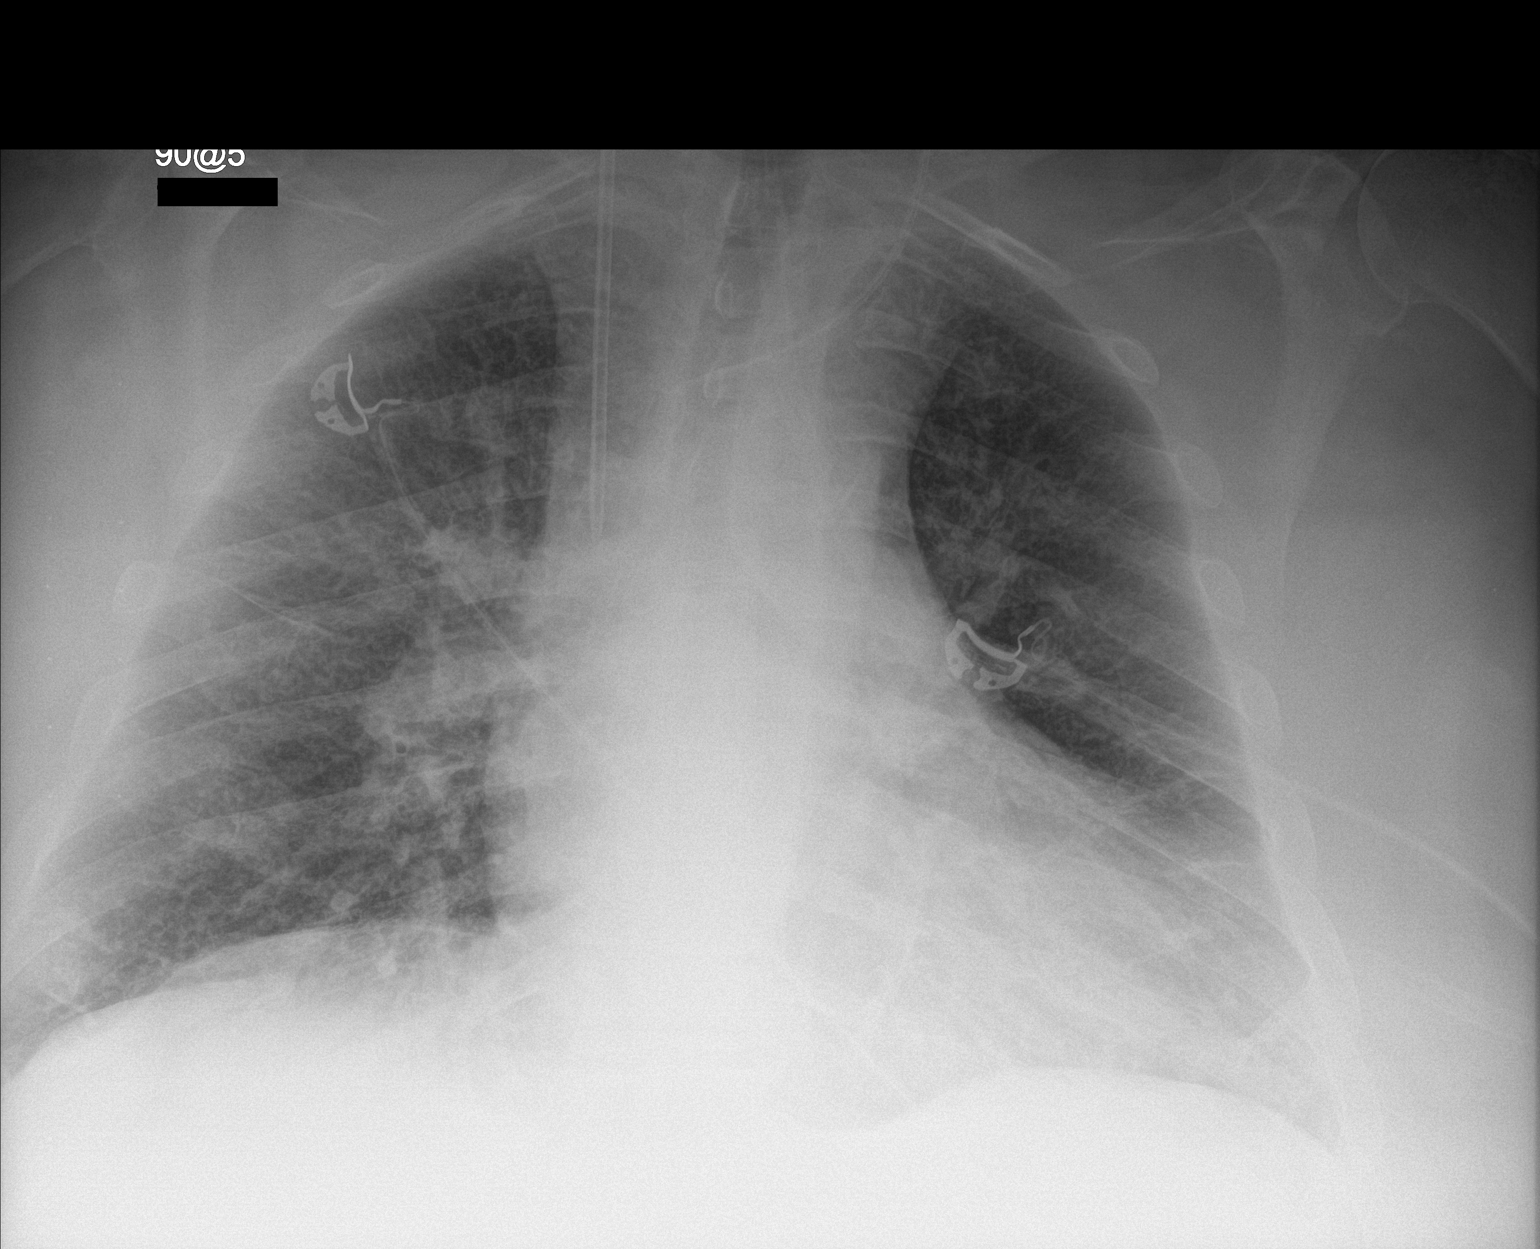
[im 2/2]
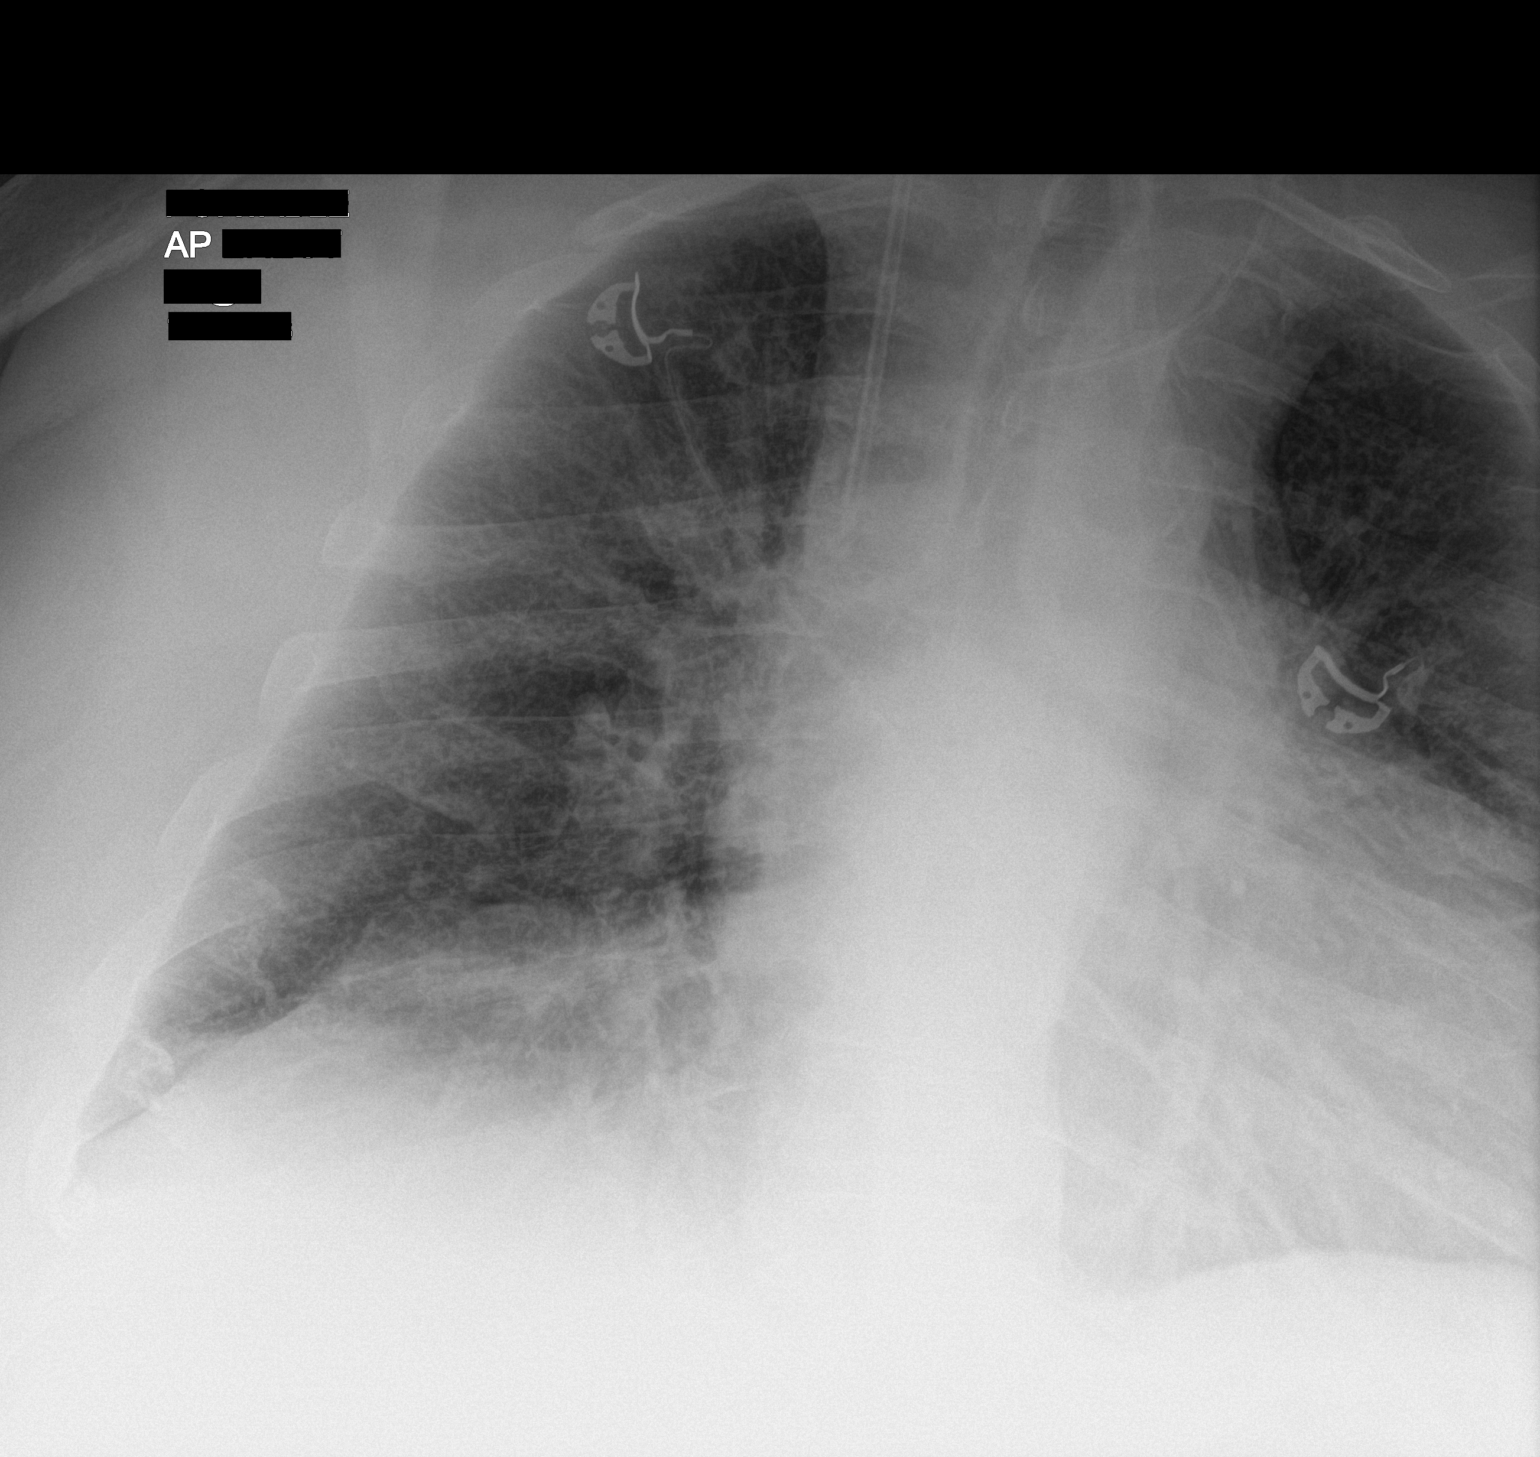

[2 of 2 positions shown; findings below may reference images not displayed]

FINDINGS: The tip of the left IJ central venous line is at the approximate
junction of the left innominate vein and SVC. No pneumothorax is
seen. There is cardiomegaly present with probable mild pulmonary
vascular congestion. No pleural effusion is seen. Right central
venous line tip is unchanged overlying the mid SVC.
IMPRESSION: 1. Left central venous line tip overlies the junction of the left
innominate vein and SVC.
2. Cardiomegaly and mild pulmonary vascular congestion.
3. Right central venous line tip overlies the mid SVC.

## 2018-04-30 IMAGING — DX DG CHEST 1V PORT
2 series · 2 of 2 positions shown · non-contrast
Comparison: 01/25/2017

CLINICAL DATA: Shortness of breath. Symptom onset after dialysis
today.

EXAM:
PORTABLE CHEST 1 VIEW

[chest ap (1 of 2)]
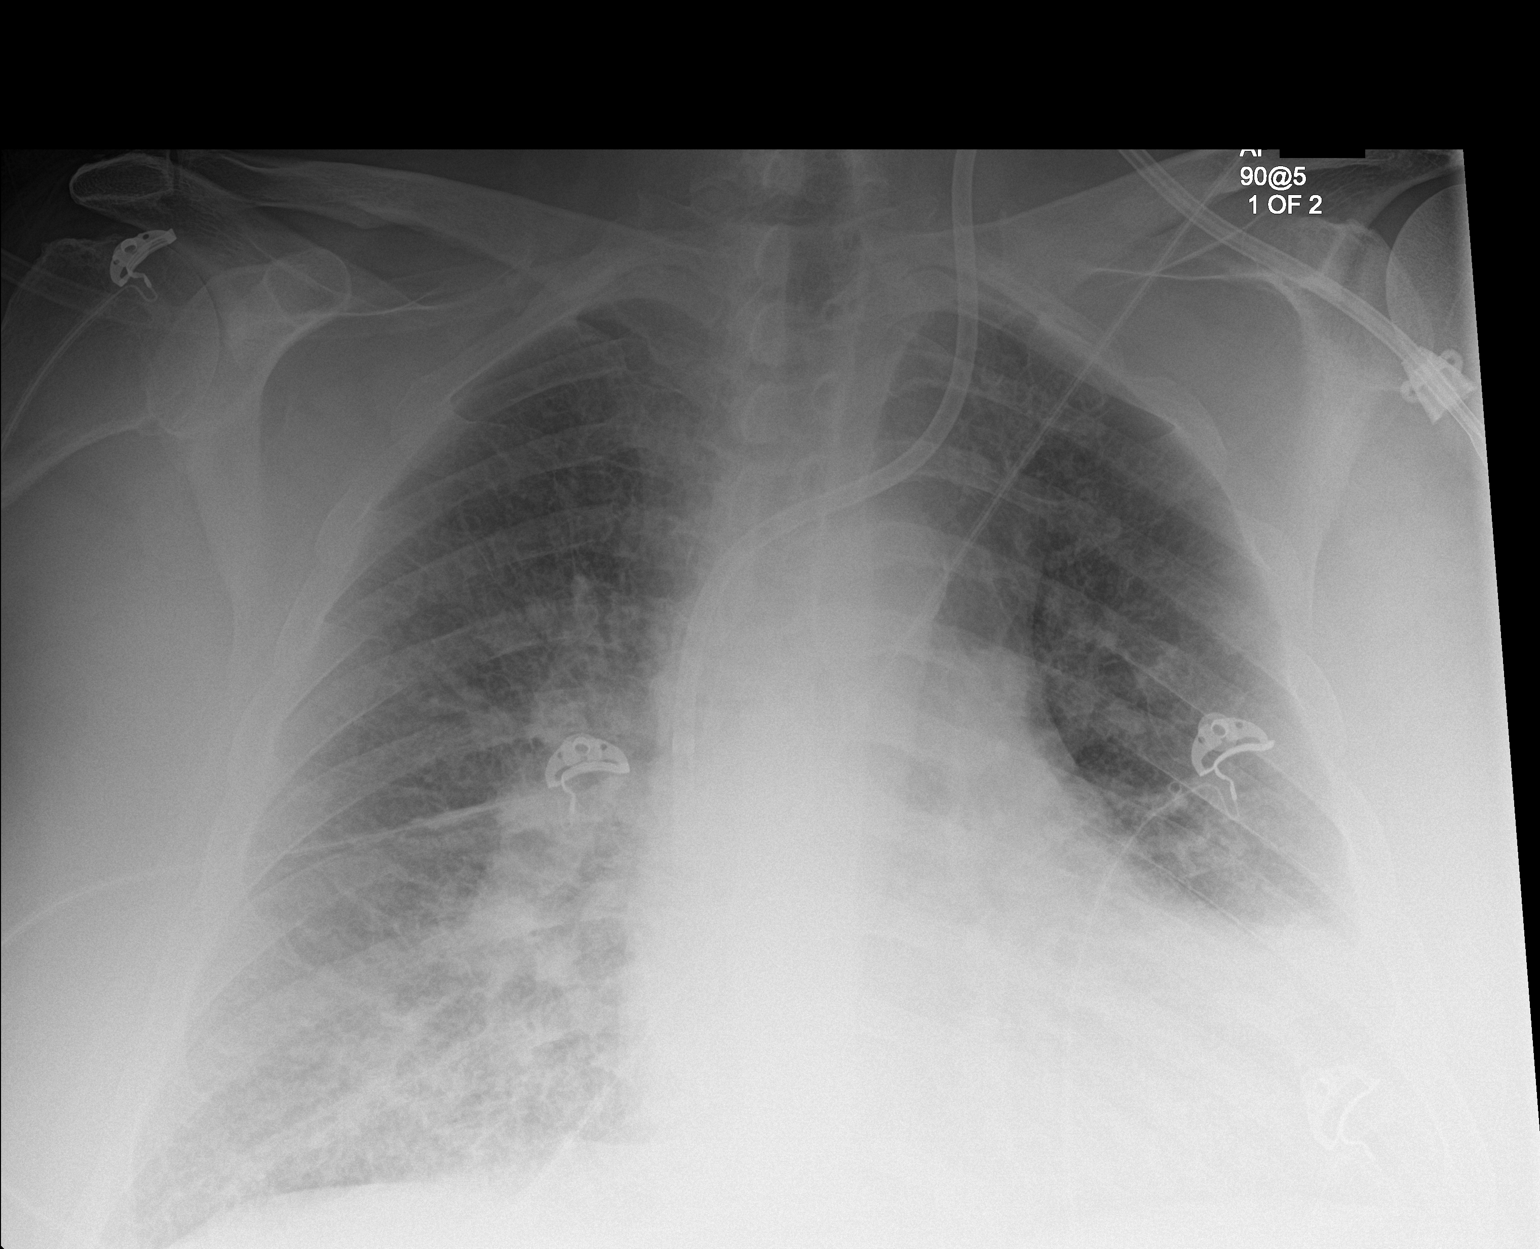

[chest ap (2 of 2)]
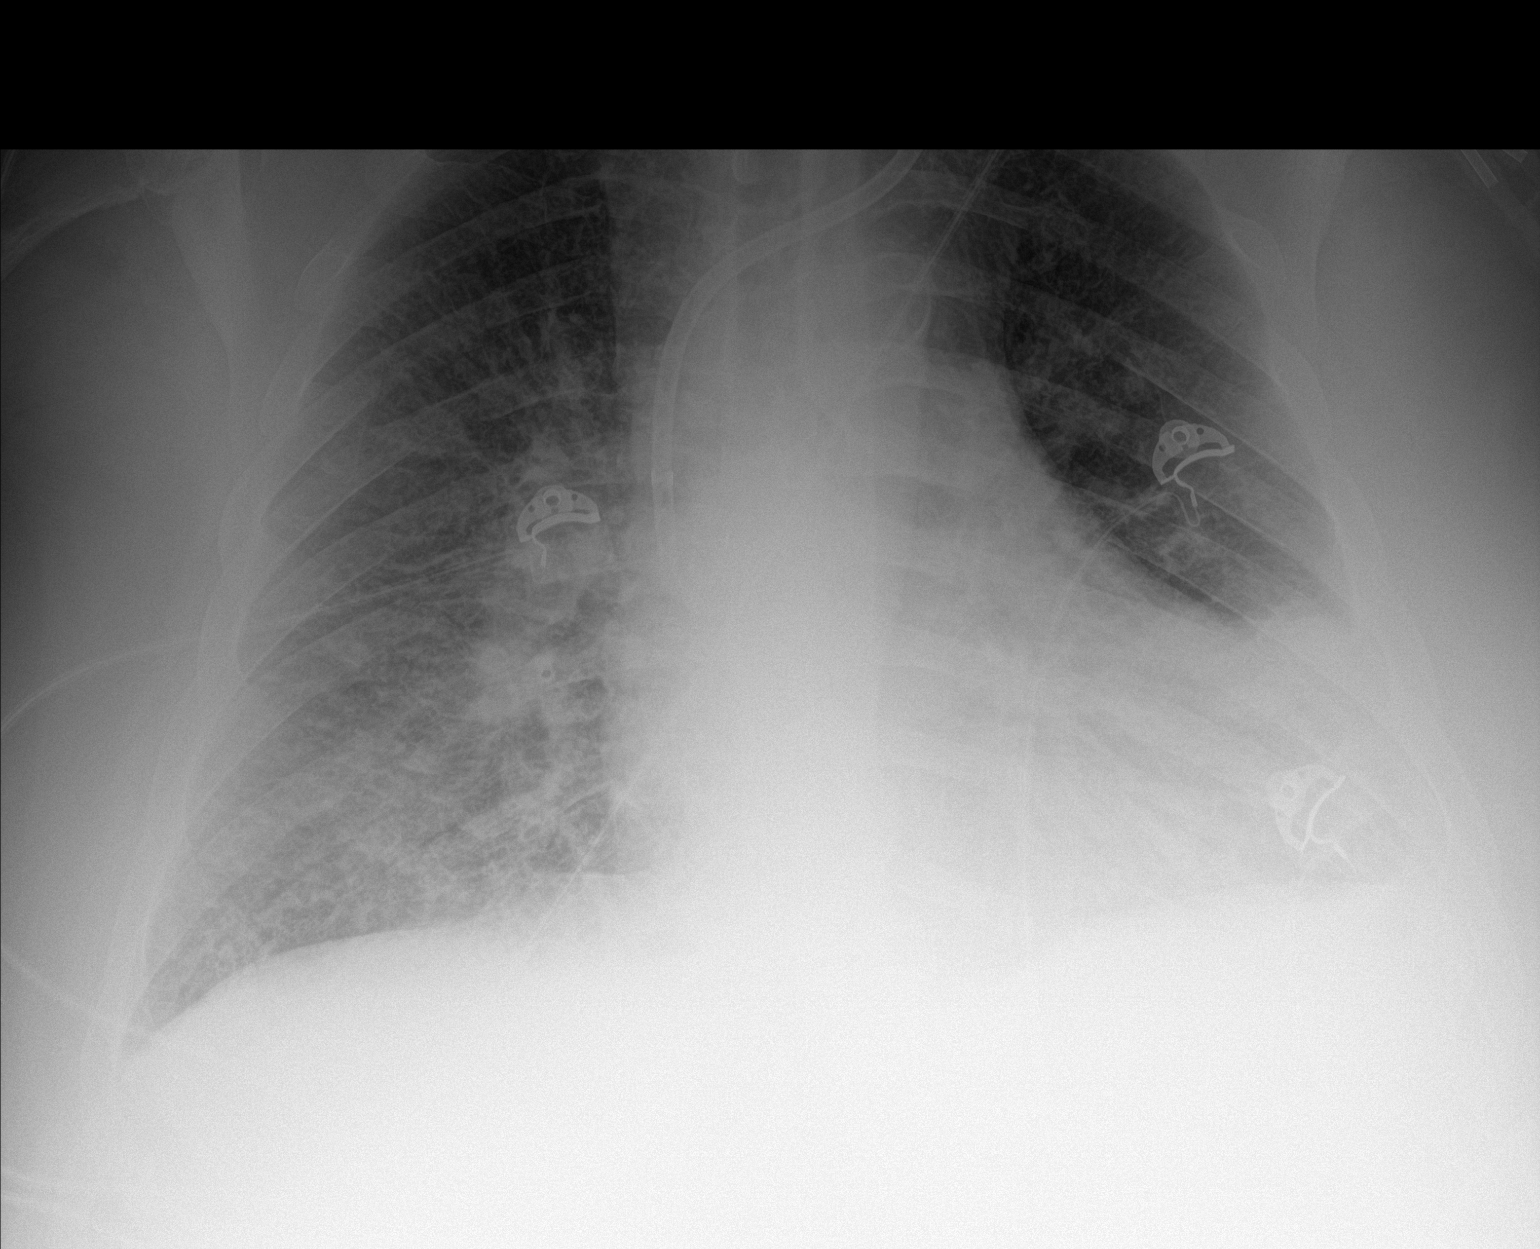

[2 of 2 positions shown; findings below may reference images not displayed]

FINDINGS: Left internal jugular dialysis catheter tip in the distal SVC. Prior
central line tip in removed. The heart is enlarged. Increased
pulmonary edema from prior exam. Possible small left pleural
effusion versus soft tissue attenuation from habitus. No
pneumothorax.
IMPRESSION: Cardiomegaly with increased pulmonary edema from prior exam.
Possible left pleural effusion.

## 2019-02-25 IMAGING — CR DG THORACIC SPINE 2V
1 series · 5 of 5 positions shown · non-contrast
Comparison: None.

CLINICAL DATA: Chronic upper back pain

EXAM:
THORACIC SPINE 2 VIEWS

[Series 1: dg thoracic spine 2 view · 0.14mm/px · 5 of 5 slices shown]
[im 1/5]
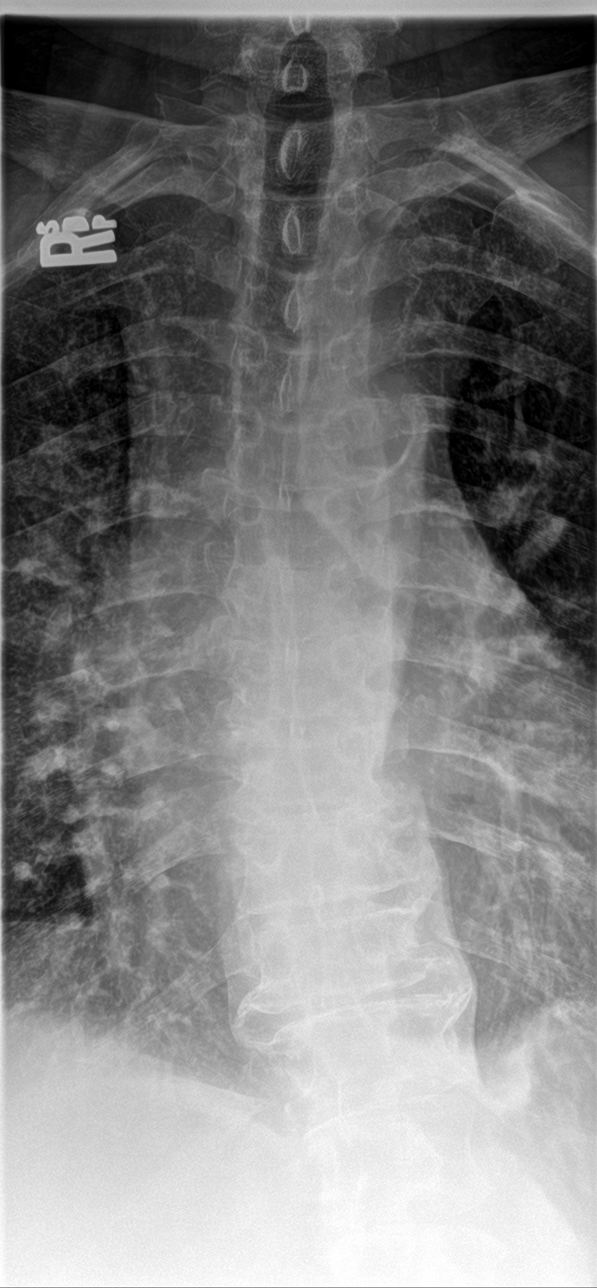
[im 2/5]
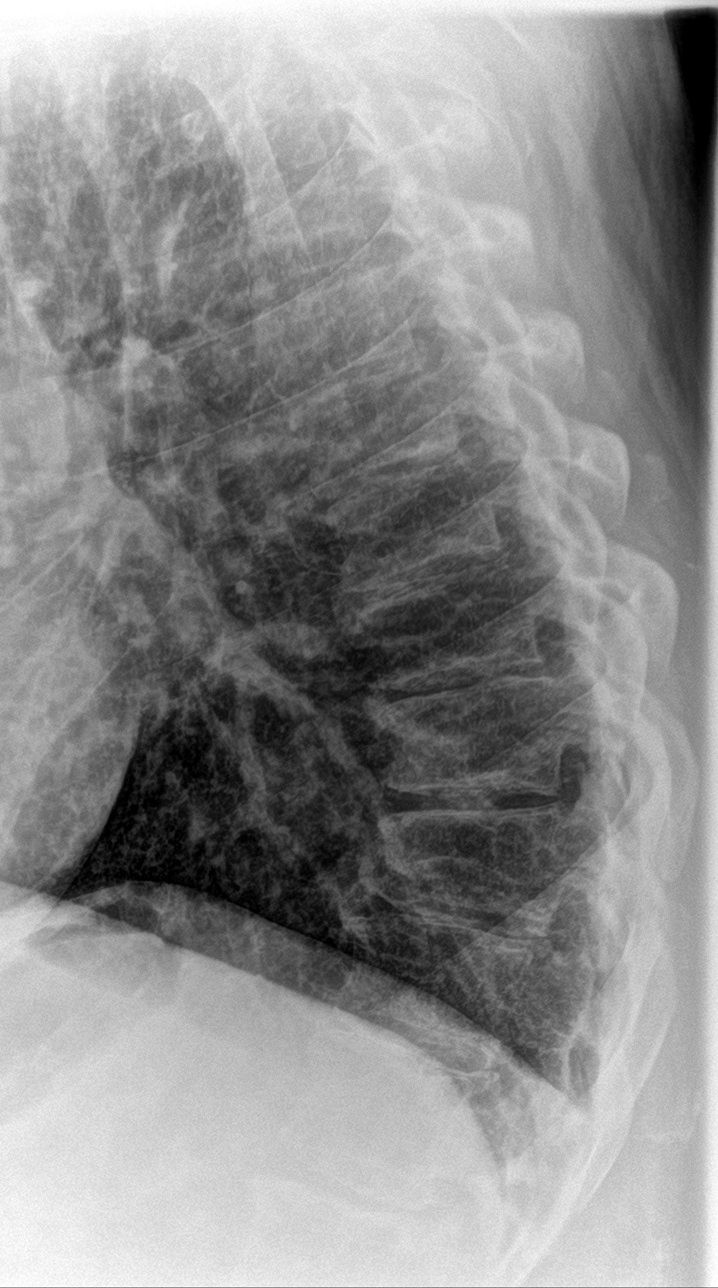
[im 3/5]
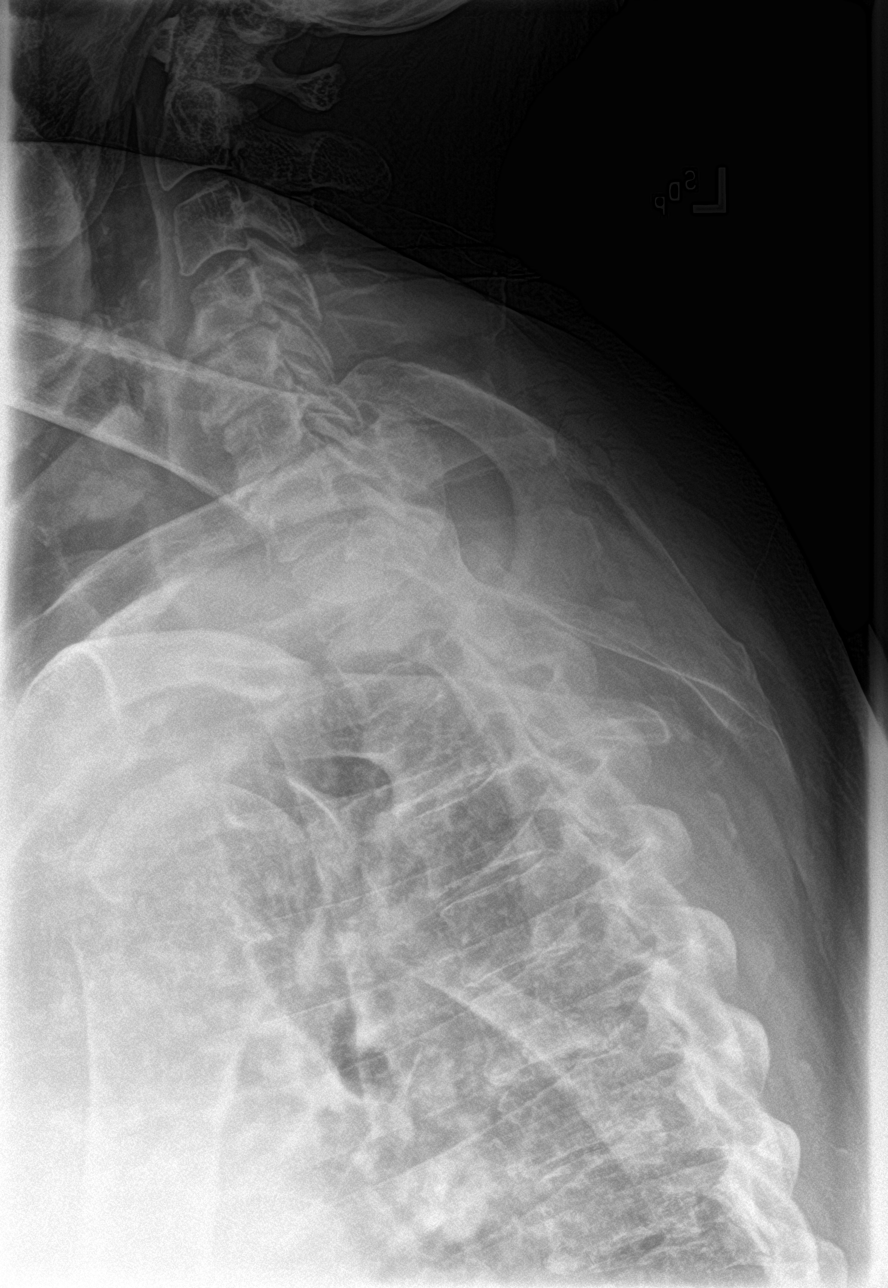
[im 4/5]
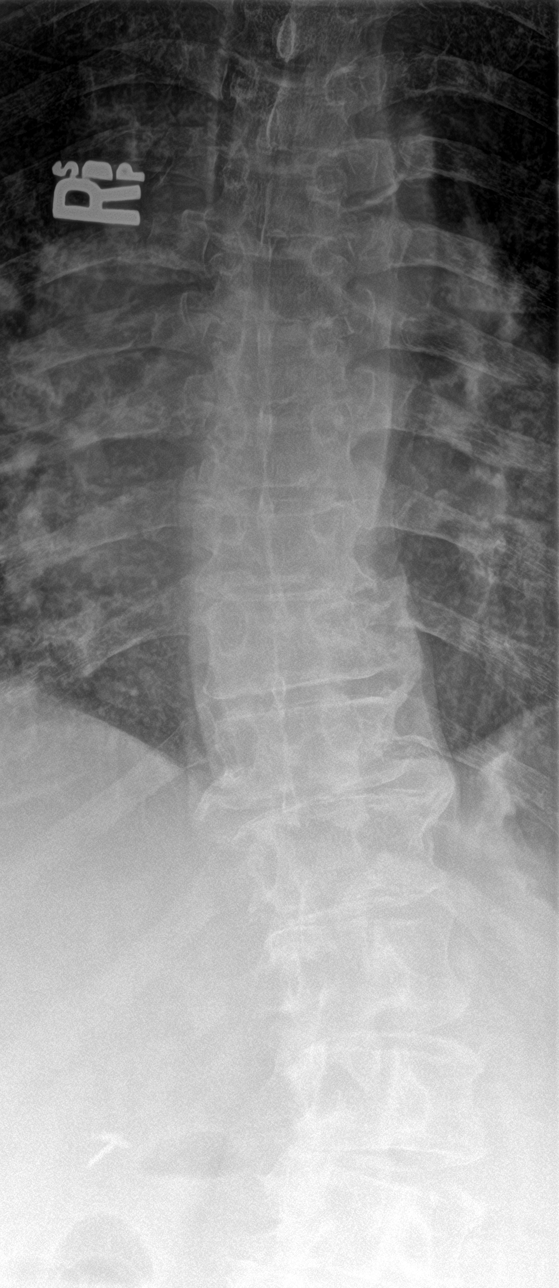
[im 5/5]
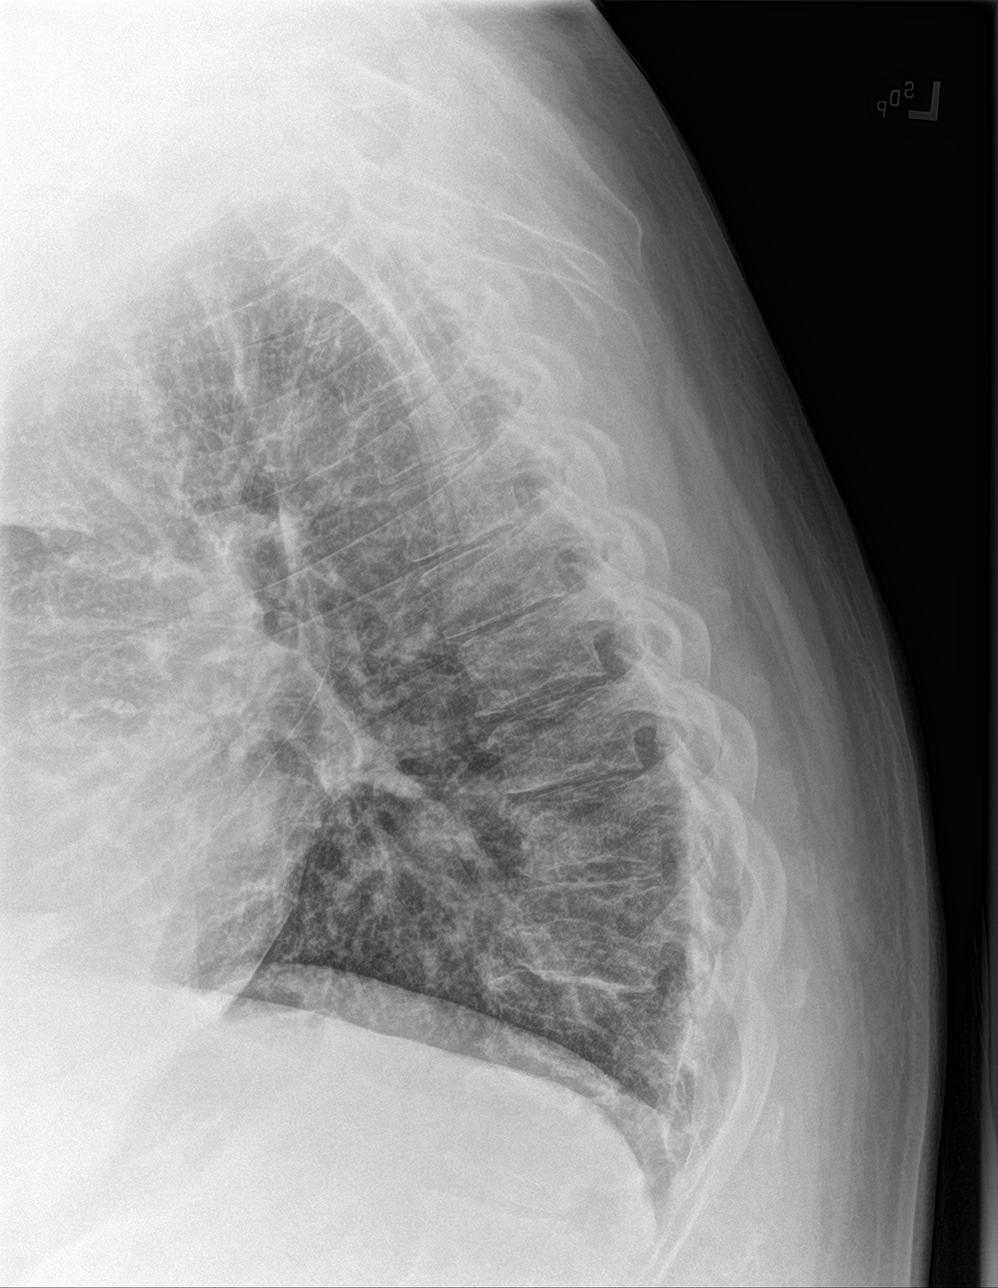

[5 of 5 positions shown; findings below may reference images not displayed]

FINDINGS: Mild diffuse degenerative spurring throughout the thoracic spine.
Diffuse osteopenia. No fracture or subluxation. Mild generalized
rightward scoliosis.
IMPRESSION: Degenerative changes and mild rightward scoliosis. Osteopenia. No
acute findings.
